# Patient Record
Sex: Male | Born: 1945 | Race: White | Hispanic: No | Marital: Married | Smoking: Former smoker
Health system: Southern US, Community
[De-identification: ages and names within clinical notes are randomized; demographics above are authoritative.]

## PROBLEM LIST (undated history)

## (undated) DIAGNOSIS — R7302 Impaired glucose tolerance (oral): Secondary | ICD-10-CM

## (undated) DIAGNOSIS — E785 Hyperlipidemia, unspecified: Secondary | ICD-10-CM

## (undated) DIAGNOSIS — K219 Gastro-esophageal reflux disease without esophagitis: Secondary | ICD-10-CM

## (undated) DIAGNOSIS — Z87442 Personal history of urinary calculi: Secondary | ICD-10-CM

## (undated) DIAGNOSIS — I209 Angina pectoris, unspecified: Secondary | ICD-10-CM

## (undated) DIAGNOSIS — M549 Dorsalgia, unspecified: Secondary | ICD-10-CM

## (undated) DIAGNOSIS — C61 Malignant neoplasm of prostate: Secondary | ICD-10-CM

## (undated) DIAGNOSIS — I219 Acute myocardial infarction, unspecified: Secondary | ICD-10-CM

## (undated) DIAGNOSIS — I8390 Asymptomatic varicose veins of unspecified lower extremity: Secondary | ICD-10-CM

## (undated) DIAGNOSIS — J449 Chronic obstructive pulmonary disease, unspecified: Secondary | ICD-10-CM

## (undated) DIAGNOSIS — R410 Disorientation, unspecified: Secondary | ICD-10-CM

## (undated) DIAGNOSIS — Z8719 Personal history of other diseases of the digestive system: Secondary | ICD-10-CM

## (undated) DIAGNOSIS — K429 Umbilical hernia without obstruction or gangrene: Secondary | ICD-10-CM

## (undated) DIAGNOSIS — R062 Wheezing: Secondary | ICD-10-CM

## (undated) DIAGNOSIS — I10 Essential (primary) hypertension: Secondary | ICD-10-CM

## (undated) DIAGNOSIS — G8929 Other chronic pain: Secondary | ICD-10-CM

## (undated) DIAGNOSIS — J189 Pneumonia, unspecified organism: Secondary | ICD-10-CM

## (undated) DIAGNOSIS — I2510 Atherosclerotic heart disease of native coronary artery without angina pectoris: Secondary | ICD-10-CM

## (undated) DIAGNOSIS — N390 Urinary tract infection, site not specified: Secondary | ICD-10-CM

## (undated) DIAGNOSIS — G629 Polyneuropathy, unspecified: Secondary | ICD-10-CM

## (undated) DIAGNOSIS — F419 Anxiety disorder, unspecified: Secondary | ICD-10-CM

## (undated) DIAGNOSIS — E7800 Pure hypercholesterolemia, unspecified: Secondary | ICD-10-CM

## (undated) DIAGNOSIS — N62 Hypertrophy of breast: Secondary | ICD-10-CM

## (undated) DIAGNOSIS — R011 Cardiac murmur, unspecified: Secondary | ICD-10-CM

## (undated) DIAGNOSIS — F32A Depression, unspecified: Secondary | ICD-10-CM

## (undated) DIAGNOSIS — G4730 Sleep apnea, unspecified: Secondary | ICD-10-CM

## (undated) DIAGNOSIS — G4700 Insomnia, unspecified: Secondary | ICD-10-CM

## (undated) DIAGNOSIS — R509 Fever, unspecified: Secondary | ICD-10-CM

## (undated) DIAGNOSIS — M5417 Radiculopathy, lumbosacral region: Secondary | ICD-10-CM

## (undated) DIAGNOSIS — F329 Major depressive disorder, single episode, unspecified: Secondary | ICD-10-CM

## (undated) DIAGNOSIS — I83893 Varicose veins of bilateral lower extremities with other complications: Secondary | ICD-10-CM

## (undated) DIAGNOSIS — IMO0001 Reserved for inherently not codable concepts without codable children: Secondary | ICD-10-CM

## (undated) DIAGNOSIS — R0602 Shortness of breath: Secondary | ICD-10-CM

## (undated) DIAGNOSIS — M1990 Unspecified osteoarthritis, unspecified site: Secondary | ICD-10-CM

## (undated) DIAGNOSIS — R739 Hyperglycemia, unspecified: Secondary | ICD-10-CM

## (undated) DIAGNOSIS — J42 Unspecified chronic bronchitis: Secondary | ICD-10-CM

## (undated) DIAGNOSIS — G4733 Obstructive sleep apnea (adult) (pediatric): Secondary | ICD-10-CM

## (undated) DIAGNOSIS — N400 Enlarged prostate without lower urinary tract symptoms: Secondary | ICD-10-CM

## (undated) DIAGNOSIS — J209 Acute bronchitis, unspecified: Secondary | ICD-10-CM

## (undated) DIAGNOSIS — M5440 Lumbago with sciatica, unspecified side: Secondary | ICD-10-CM

## (undated) DIAGNOSIS — Z0001 Encounter for general adult medical examination with abnormal findings: Secondary | ICD-10-CM

## (undated) DIAGNOSIS — J069 Acute upper respiratory infection, unspecified: Secondary | ICD-10-CM

## (undated) HISTORY — DX: Major depressive disorder, single episode, unspecified: F32.9

## (undated) HISTORY — DX: Reserved for inherently not codable concepts without codable children: IMO0001

## (undated) HISTORY — DX: Impaired glucose tolerance (oral): R73.02

## (undated) HISTORY — DX: Dorsalgia, unspecified: M54.9

## (undated) HISTORY — DX: Fever, unspecified: R50.9

## (undated) HISTORY — DX: Acute upper respiratory infection, unspecified: J06.9

## (undated) HISTORY — PX: CARDIAC CATHETERIZATION: SHX172

## (undated) HISTORY — DX: Gastro-esophageal reflux disease without esophagitis: K21.9

## (undated) HISTORY — DX: Other chronic pain: G89.29

## (undated) HISTORY — DX: Pneumonia, unspecified organism: J18.9

## (undated) HISTORY — DX: Urinary tract infection, site not specified: N39.0

## (undated) HISTORY — DX: Hypertrophy of breast: N62

## (undated) HISTORY — PX: ANGIOPLASTY: SHX39

## (undated) HISTORY — DX: Atherosclerotic heart disease of native coronary artery without angina pectoris: I25.10

## (undated) HISTORY — PX: WRIST SURGERY: SHX841

## (undated) HISTORY — DX: Hyperglycemia, unspecified: R73.9

## (undated) HISTORY — DX: Obstructive sleep apnea (adult) (pediatric): G47.33

## (undated) HISTORY — PX: BACK SURGERY: SHX140

## (undated) HISTORY — DX: Disorientation, unspecified: R41.0

## (undated) HISTORY — DX: Umbilical hernia without obstruction or gangrene: K42.9

## (undated) HISTORY — DX: Wheezing: R06.2

## (undated) HISTORY — DX: Encounter for general adult medical examination with abnormal findings: Z00.01

## (undated) HISTORY — DX: Depression, unspecified: F32.A

## (undated) HISTORY — DX: Polyneuropathy, unspecified: G62.9

## (undated) HISTORY — DX: Pure hypercholesterolemia, unspecified: E78.00

## (undated) HISTORY — DX: Lumbago with sciatica, unspecified side: M54.40

## (undated) HISTORY — PX: HERNIA REPAIR: SHX51

## (undated) HISTORY — DX: Acute bronchitis, unspecified: J20.9

## (undated) HISTORY — DX: Shortness of breath: R06.02

## (undated) HISTORY — DX: Acute myocardial infarction, unspecified: I21.9

## (undated) HISTORY — DX: Benign prostatic hyperplasia without lower urinary tract symptoms: N40.0

## (undated) HISTORY — DX: Insomnia, unspecified: G47.00

## (undated) HISTORY — DX: Anxiety disorder, unspecified: F41.9

## (undated) HISTORY — DX: Varicose veins of bilateral lower extremities with other complications: I83.893

## (undated) HISTORY — DX: Radiculopathy, lumbosacral region: M54.17

## (undated) HISTORY — DX: Hyperlipidemia, unspecified: E78.5

## (undated) HISTORY — DX: Unspecified chronic bronchitis: J42

## (undated) HISTORY — PX: APPENDECTOMY: SHX54

---

## 1995-03-10 ENCOUNTER — Encounter: Payer: Self-pay | Admitting: Gastroenterology

## 1995-03-14 ENCOUNTER — Encounter (INDEPENDENT_AMBULATORY_CARE_PROVIDER_SITE_OTHER): Payer: Self-pay | Admitting: *Deleted

## 1995-03-15 ENCOUNTER — Encounter: Payer: Self-pay | Admitting: Gastroenterology

## 1995-04-21 ENCOUNTER — Encounter: Payer: Self-pay | Admitting: Gastroenterology

## 2000-10-02 ENCOUNTER — Ambulatory Visit (HOSPITAL_COMMUNITY): Admission: RE | Admit: 2000-10-02 | Discharge: 2000-10-02 | Payer: Self-pay | Admitting: Internal Medicine

## 2000-10-02 ENCOUNTER — Encounter: Payer: Self-pay | Admitting: Gastroenterology

## 2000-10-06 ENCOUNTER — Encounter: Payer: Self-pay | Admitting: Internal Medicine

## 2000-10-06 ENCOUNTER — Ambulatory Visit (HOSPITAL_COMMUNITY): Admission: RE | Admit: 2000-10-06 | Discharge: 2000-10-06 | Payer: Self-pay | Admitting: Internal Medicine

## 2001-01-02 ENCOUNTER — Encounter: Payer: Self-pay | Admitting: Cardiology

## 2001-01-02 ENCOUNTER — Ambulatory Visit (HOSPITAL_COMMUNITY): Admission: RE | Admit: 2001-01-02 | Discharge: 2001-01-02 | Payer: Self-pay | Admitting: *Deleted

## 2004-11-03 ENCOUNTER — Ambulatory Visit: Payer: Self-pay | Admitting: *Deleted

## 2004-11-23 ENCOUNTER — Ambulatory Visit: Payer: Self-pay

## 2005-09-21 ENCOUNTER — Ambulatory Visit: Payer: Self-pay | Admitting: *Deleted

## 2006-02-07 ENCOUNTER — Ambulatory Visit: Payer: Self-pay | Admitting: *Deleted

## 2006-02-15 ENCOUNTER — Ambulatory Visit: Payer: Self-pay

## 2006-08-10 ENCOUNTER — Ambulatory Visit: Payer: Self-pay | Admitting: *Deleted

## 2007-02-21 ENCOUNTER — Ambulatory Visit: Payer: Self-pay | Admitting: *Deleted

## 2007-03-13 ENCOUNTER — Ambulatory Visit: Payer: Self-pay | Admitting: Internal Medicine

## 2007-03-13 LAB — CONVERTED CEMR LAB
ALT: 35 U/L
AST: 27 U/L
Albumin: 3.4 g/dL — ABNORMAL LOW
Alkaline Phosphatase: 91 U/L
BUN: 12 mg/dL
Basophils Absolute: 0 10*3/uL
Basophils Relative: 0 %
CO2: 32 meq/L
Calcium: 8.8 mg/dL
Chloride: 99 meq/L
Creatinine, Ser: 1.1 mg/dL
Eosinophils Absolute: 0.2 10*3/uL
Eosinophils Relative: 1.6 %
GFR calc Af Amer: 88 mL/min
GFR calc non Af Amer: 72 mL/min
Glucose, Bld: 139 mg/dL — ABNORMAL HIGH
HCT: 42.6 %
Hemoglobin: 14.5 g/dL
Lymphocytes Relative: 23.1 %
MCHC: 34.2 g/dL
MCV: 88.1 fL
Monocytes Absolute: 1.4 10*3/uL — ABNORMAL HIGH
Monocytes Relative: 9.8 %
Neutro Abs: 9.7 10*3/uL — ABNORMAL HIGH
Neutrophils Relative %: 65.5 %
Platelets: 302 10*3/uL
Potassium: 4.7 meq/L
RBC: 4.83 M/uL
RDW: 12.3 %
Sodium: 136 meq/L
TSH: 3.57 u[IU]/mL
Total Bilirubin: 0.5 mg/dL
Total Protein: 6.8 g/dL
WBC: 14.7 10*3/uL — ABNORMAL HIGH

## 2007-03-21 ENCOUNTER — Ambulatory Visit: Payer: Self-pay | Admitting: *Deleted

## 2007-03-21 ENCOUNTER — Ambulatory Visit: Payer: Self-pay

## 2007-03-21 LAB — CONVERTED CEMR LAB
ALT: 31 U/L
AST: 28 U/L
Albumin: 3.3 g/dL — ABNORMAL LOW
Alkaline Phosphatase: 94 U/L
BUN: 10 mg/dL
Bilirubin, Direct: 0.1 mg/dL
CO2: 34 meq/L — ABNORMAL HIGH
Calcium: 8.8 mg/dL
Chloride: 102 meq/L
Cholesterol: 138 mg/dL
Creatinine, Ser: 1 mg/dL
Direct LDL: 76.6 mg/dL
GFR calc Af Amer: 98 mL/min
GFR calc non Af Amer: 81 mL/min
Glucose, Bld: 116 mg/dL — ABNORMAL HIGH
HDL: 32.4 mg/dL — ABNORMAL LOW
Potassium: 4.2 meq/L
Sodium: 142 meq/L
Total Bilirubin: 0.7 mg/dL
Total CHOL/HDL Ratio: 4.3
Total Protein: 6.5 g/dL
Triglycerides: 238 mg/dL
VLDL: 48 mg/dL — ABNORMAL HIGH

## 2007-04-04 ENCOUNTER — Ambulatory Visit: Payer: Self-pay | Admitting: Internal Medicine

## 2007-10-11 ENCOUNTER — Ambulatory Visit: Payer: Self-pay | Admitting: Internal Medicine

## 2008-04-25 ENCOUNTER — Ambulatory Visit: Payer: Self-pay | Admitting: Internal Medicine

## 2008-04-29 ENCOUNTER — Ambulatory Visit: Payer: Self-pay | Admitting: Pulmonary Disease

## 2008-04-29 DIAGNOSIS — E785 Hyperlipidemia, unspecified: Secondary | ICD-10-CM | POA: Insufficient documentation

## 2008-04-29 DIAGNOSIS — I219 Acute myocardial infarction, unspecified: Secondary | ICD-10-CM | POA: Insufficient documentation

## 2008-04-29 DIAGNOSIS — J42 Unspecified chronic bronchitis: Secondary | ICD-10-CM | POA: Insufficient documentation

## 2008-04-29 HISTORY — DX: Acute myocardial infarction, unspecified: I21.9

## 2008-04-29 HISTORY — DX: Hyperlipidemia, unspecified: E78.5

## 2008-04-29 HISTORY — DX: Unspecified chronic bronchitis: J42

## 2008-06-12 ENCOUNTER — Ambulatory Visit: Payer: Self-pay | Admitting: Pulmonary Disease

## 2008-07-30 ENCOUNTER — Ambulatory Visit: Payer: Self-pay | Admitting: Pulmonary Disease

## 2008-07-30 ENCOUNTER — Ambulatory Visit: Payer: Self-pay | Admitting: Internal Medicine

## 2008-07-30 LAB — CONVERTED CEMR LAB
ALT: 17 U/L
AST: 22 U/L

## 2008-08-08 ENCOUNTER — Telehealth (INDEPENDENT_AMBULATORY_CARE_PROVIDER_SITE_OTHER): Payer: Self-pay | Admitting: *Deleted

## 2008-08-13 ENCOUNTER — Telehealth (INDEPENDENT_AMBULATORY_CARE_PROVIDER_SITE_OTHER): Payer: Self-pay | Admitting: *Deleted

## 2008-08-20 ENCOUNTER — Encounter: Payer: Self-pay | Admitting: Pulmonary Disease

## 2008-09-05 ENCOUNTER — Ambulatory Visit: Payer: Self-pay | Admitting: Pulmonary Disease

## 2008-09-12 ENCOUNTER — Telehealth (INDEPENDENT_AMBULATORY_CARE_PROVIDER_SITE_OTHER): Payer: Self-pay | Admitting: *Deleted

## 2008-10-20 ENCOUNTER — Ambulatory Visit: Payer: Self-pay | Admitting: Internal Medicine

## 2008-10-23 ENCOUNTER — Telehealth (INDEPENDENT_AMBULATORY_CARE_PROVIDER_SITE_OTHER): Payer: Self-pay | Admitting: *Deleted

## 2008-11-25 ENCOUNTER — Telehealth (INDEPENDENT_AMBULATORY_CARE_PROVIDER_SITE_OTHER): Payer: Self-pay | Admitting: *Deleted

## 2008-12-09 ENCOUNTER — Ambulatory Visit: Payer: Self-pay | Admitting: Cardiology

## 2008-12-09 LAB — CONVERTED CEMR LAB
BUN: 14 mg/dL
CO2: 25 meq/L
Calcium: 9.1 mg/dL
Chloride: 101 meq/L
Creatinine, Ser: 1.03 mg/dL
Glucose, Bld: 83 mg/dL
Potassium: 4.6 meq/L
Pro B Natriuretic peptide (BNP): 55.4 pg/mL
Sodium: 142 meq/L

## 2009-01-12 ENCOUNTER — Ambulatory Visit: Payer: Self-pay | Admitting: Internal Medicine

## 2009-04-21 ENCOUNTER — Telehealth: Payer: Self-pay | Admitting: Internal Medicine

## 2009-04-22 ENCOUNTER — Telehealth: Payer: Self-pay | Admitting: Internal Medicine

## 2009-06-18 ENCOUNTER — Telehealth: Payer: Self-pay | Admitting: Internal Medicine

## 2009-08-06 ENCOUNTER — Encounter (INDEPENDENT_AMBULATORY_CARE_PROVIDER_SITE_OTHER): Payer: Self-pay | Admitting: *Deleted

## 2009-10-14 ENCOUNTER — Ambulatory Visit: Payer: Self-pay | Admitting: Internal Medicine

## 2009-10-16 DIAGNOSIS — I2510 Atherosclerotic heart disease of native coronary artery without angina pectoris: Secondary | ICD-10-CM | POA: Insufficient documentation

## 2009-10-16 HISTORY — DX: Atherosclerotic heart disease of native coronary artery without angina pectoris: I25.10

## 2009-10-19 ENCOUNTER — Ambulatory Visit: Payer: Self-pay | Admitting: Internal Medicine

## 2010-04-12 ENCOUNTER — Ambulatory Visit: Payer: Self-pay | Admitting: Family Medicine

## 2010-06-11 ENCOUNTER — Encounter (INDEPENDENT_AMBULATORY_CARE_PROVIDER_SITE_OTHER): Payer: Self-pay | Admitting: *Deleted

## 2010-10-08 ENCOUNTER — Ambulatory Visit: Payer: Self-pay | Admitting: Internal Medicine

## 2010-10-08 ENCOUNTER — Encounter: Payer: Self-pay | Admitting: Internal Medicine

## 2010-10-08 ENCOUNTER — Telehealth: Payer: Self-pay | Admitting: Internal Medicine

## 2010-10-08 LAB — CONVERTED CEMR LAB
BUN: 9 mg/dL
Basophils Absolute: 0 10*3/uL
Basophils Relative: 0.5 %
CO2: 32 meq/L
Calcium: 8.7 mg/dL
Chloride: 101 meq/L
Creatinine, Ser: 0.9 mg/dL
Eosinophils Absolute: 0.4 10*3/uL
Eosinophils Relative: 4.9 %
GFR calc non Af Amer: 85.71 mL/min
Glucose, Bld: 88 mg/dL
HCT: 37.2 % — ABNORMAL LOW
Hemoglobin: 12.6 g/dL — ABNORMAL LOW
Lymphocytes Relative: 27.5 %
Lymphs Abs: 2.3 10*3/uL
MCHC: 34 g/dL
MCV: 91.9 fL
Monocytes Absolute: 0.6 10*3/uL
Monocytes Relative: 7.5 %
Neutro Abs: 5 10*3/uL
Neutrophils Relative %: 59.6 %
Platelets: 234 10*3/uL
Potassium: 3.6 meq/L
Pro B Natriuretic peptide (BNP): 53.3 pg/mL
RBC: 4.05 M/uL — ABNORMAL LOW
RDW: 13.8 %
Sodium: 140 meq/L
TSH: 4.97 u[IU]/mL
WBC: 8.5 10*3/uL

## 2010-10-12 ENCOUNTER — Telehealth: Payer: Self-pay | Admitting: Internal Medicine

## 2010-10-14 ENCOUNTER — Ambulatory Visit: Payer: Self-pay | Admitting: Internal Medicine

## 2010-10-14 ENCOUNTER — Ambulatory Visit: Payer: Self-pay

## 2010-10-14 ENCOUNTER — Encounter: Payer: Self-pay | Admitting: Internal Medicine

## 2010-10-14 ENCOUNTER — Ambulatory Visit (HOSPITAL_COMMUNITY): Admission: RE | Admit: 2010-10-14 | Discharge: 2010-10-14 | Payer: Self-pay | Admitting: Internal Medicine

## 2010-10-15 ENCOUNTER — Encounter: Payer: Self-pay | Admitting: Internal Medicine

## 2010-10-26 ENCOUNTER — Telehealth (INDEPENDENT_AMBULATORY_CARE_PROVIDER_SITE_OTHER): Payer: Self-pay | Admitting: Radiology

## 2010-10-27 ENCOUNTER — Encounter: Payer: Self-pay | Admitting: Cardiology

## 2010-10-27 ENCOUNTER — Ambulatory Visit: Payer: Self-pay

## 2010-10-27 ENCOUNTER — Encounter (HOSPITAL_COMMUNITY)
Admission: RE | Admit: 2010-10-27 | Discharge: 2010-12-25 | Payer: Self-pay | Source: Home / Self Care | Attending: Internal Medicine | Admitting: Internal Medicine

## 2010-10-27 ENCOUNTER — Ambulatory Visit: Payer: Self-pay | Admitting: Internal Medicine

## 2010-10-27 ENCOUNTER — Telehealth (INDEPENDENT_AMBULATORY_CARE_PROVIDER_SITE_OTHER): Payer: Self-pay | Admitting: *Deleted

## 2010-10-27 ENCOUNTER — Ambulatory Visit: Payer: Self-pay | Admitting: Cardiology

## 2010-10-27 LAB — CONVERTED CEMR LAB
BUN: 10 mg/dL
CO2: 35 meq/L — ABNORMAL HIGH
Calcium: 8.8 mg/dL
Chloride: 97 meq/L
Creatinine, Ser: 0.9 mg/dL
GFR calc non Af Amer: 94.96 mL/min
Glucose, Bld: 108 mg/dL — ABNORMAL HIGH
Potassium: 3.1 meq/L — ABNORMAL LOW
Sodium: 141 meq/L

## 2010-10-28 ENCOUNTER — Encounter: Payer: Self-pay | Admitting: Internal Medicine

## 2010-10-28 ENCOUNTER — Telehealth: Payer: Self-pay | Admitting: Internal Medicine

## 2010-11-01 ENCOUNTER — Telehealth: Payer: Self-pay | Admitting: Internal Medicine

## 2010-11-04 ENCOUNTER — Ambulatory Visit: Payer: Self-pay | Admitting: Internal Medicine

## 2010-11-04 ENCOUNTER — Encounter: Payer: Self-pay | Admitting: Internal Medicine

## 2010-11-05 ENCOUNTER — Ambulatory Visit: Payer: Self-pay | Admitting: Internal Medicine

## 2010-11-05 DIAGNOSIS — R0602 Shortness of breath: Secondary | ICD-10-CM

## 2010-11-05 HISTORY — DX: Shortness of breath: R06.02

## 2010-11-05 LAB — CONVERTED CEMR LAB
BUN: 14 mg/dL
CO2: 30 meq/L
Calcium: 9 mg/dL
Chloride: 98 meq/L
Creatinine, Ser: 1.02 mg/dL
Glucose, Bld: 104 mg/dL — ABNORMAL HIGH
Potassium: 3.5 meq/L
Sodium: 141 meq/L

## 2010-11-08 ENCOUNTER — Ambulatory Visit: Payer: Self-pay | Admitting: Internal Medicine

## 2010-11-08 ENCOUNTER — Inpatient Hospital Stay (HOSPITAL_BASED_OUTPATIENT_CLINIC_OR_DEPARTMENT_OTHER): Admission: RE | Admit: 2010-11-08 | Discharge: 2010-11-08 | Payer: Self-pay | Admitting: Internal Medicine

## 2010-11-11 ENCOUNTER — Telehealth: Payer: Self-pay | Admitting: Internal Medicine

## 2010-12-01 ENCOUNTER — Encounter: Payer: Self-pay | Admitting: Pulmonary Disease

## 2010-12-01 ENCOUNTER — Ambulatory Visit (HOSPITAL_BASED_OUTPATIENT_CLINIC_OR_DEPARTMENT_OTHER)
Admission: RE | Admit: 2010-12-01 | Discharge: 2010-12-01 | Payer: Self-pay | Source: Home / Self Care | Admitting: Internal Medicine

## 2010-12-06 ENCOUNTER — Telehealth: Payer: Self-pay | Admitting: Internal Medicine

## 2010-12-16 ENCOUNTER — Telehealth: Payer: Self-pay | Admitting: Internal Medicine

## 2010-12-17 ENCOUNTER — Encounter: Payer: Self-pay | Admitting: Internal Medicine

## 2010-12-17 DIAGNOSIS — G4730 Sleep apnea, unspecified: Secondary | ICD-10-CM | POA: Insufficient documentation

## 2011-01-06 ENCOUNTER — Ambulatory Visit: Admit: 2011-01-06 | Payer: Self-pay | Admitting: Pulmonary Disease

## 2011-01-19 ENCOUNTER — Telehealth: Payer: Self-pay | Admitting: Internal Medicine

## 2011-01-21 ENCOUNTER — Ambulatory Visit: Admit: 2011-01-21 | Payer: Self-pay | Admitting: Internal Medicine

## 2011-01-23 LAB — CONVERTED CEMR LAB: AST: 24 U/L

## 2011-01-25 ENCOUNTER — Ambulatory Visit: Admit: 2011-01-25 | Payer: Self-pay | Admitting: Pulmonary Disease

## 2011-01-27 NOTE — Assessment & Plan Note (Signed)
 Summary: f86m/jss   Visit Type:  Follow-up Referring Provider:  self-referral Primary Provider:  [facility]  CC:  pt dooing ok .  History of Present Illness: the patient is a 65 year old gentleman with CAD, COPD, dyslipidemia, hypertension. I last saw him in February of this year.  In the interval. He denies chest pain. His breathing is short some days. He is doing quite a bit of fishing. His weight has gone up. He has a hard time with sweets.  being evaluated for abdominal hernia  Current Medications (verified): 1)  Proair  Hfa 108 (90 Base) Mcg/act  Aers (Albuterol  Sulfate) .... 2 Puffs Qid Prn 2)  Symbicort 160-4.5 Mcg/act  Aero (Budesonide-Formoterol  Fumarate) .SABRA.. 1 Puffs Two Times A Day 3)  Simvastatin  80 Mg  Tabs (Simvastatin ) .... One Tablet Daily. 4)  Toprol  Xl 25 Mg  Tb24 (Metoprolol  Succinate) .... One Tablet Daily. 5)  Lasix  20 Mg  Tabs (Furosemide ) .... One Tablet Two Times A Day 6)  Adult Aspirin  Ec Low Strength 81 Mg  Tbec (Aspirin ) .... One Tablet Daily. 7)  Methadone  Hcl 10 Mg  Tabs (Methadone  Hcl) .... Two  Tablets Four Times Daily 8)  Percocet 10-325 Mg  Tabs (Oxycodone -Acetaminophen ) .... Use Four Tablets Daily As Needed 9)  Flexeril 5 Mg  Tabs (Cyclobenzaprine Hcl) .... Take Two Tabs At Bedtime 10)  Klonopin 0.5 Mg  Tabs (Clonazepam) .... One Tablet At Bedtime As Needed 11)  Sertraline Hcl 100 Mg  Tabs (Sertraline Hcl) .... 200 Mg Daily 12)  Prilosec 20 Mg  Cpdr (Omeprazole ) .... One Tablet Daily. 13)  Isosorbide Mononitrate Cr 30 Mg Xr24h-Tab (Isosorbide Mononitrate) .... Take 1 Tablet By Mouth Once A Day 14)  Lisinopril 20 Mg Tabs (Lisinopril) .... Take 1 Tablet By Mouth Once A Day 15)  Atacand 8 Mg Tabs (Candesartan Cilexetil) .... 1/2 Tab Once Daily  Allergies (verified): No Known Drug Allergies  Past History:  Past Surgical History: Last updated: 04/29/2008 Back surgery x 3 Appendectomy  Family History: Last updated: 04/29/2008 Father -  emphysema, prostate cancer  Social History: Last updated: 04/29/2008 Married.  Retired naval architect.  Disabled due to back problems.  Quit smoking in 1997.  Smoke 1.5 packs per day since age 21.  Past Medical History: CAD Hypercholesterolemia GERD Pneumonia Anxiety Chronic back pain ACE induced cough Chronic bronchitis  Vital Signs:  Patient profile:   65 year old male Height:      70 inches Weight:      229 pounds BMI:     32.98 Pulse rate:   70 / minute BP sitting:   130 / 72  (left arm) Cuff size:   regular  Vitals Entered By: Barnie Molt, CNA (October 19, 2009 3:43 PM)  Physical Exam  General:  Well developed, well nourished, in no acute distress. Head:  normocephalic and atraumatic Neck:  JVP normal. No definite bruits Lungs:  moving air. No Rales, no wheezes Heart:  regular rate rhythm. S1, S2. No S3. No significant murmur Abdomen:  obese. Tense. Nontender ventral hernia noted, reducible. Extremities:  no edema   EKG  Procedure date:  10/19/2009  Findings:      normal sinus rhythm. 74 beats per minute. Inferior wall MI  Impression & Recommendations:  Problem # 1:  CAD (ICD-414.00) no signs of active ischemia, I would keep him on the same regimen. Encouraged him to stay activem try to bring his weight down. Her current suite  I have substituted Cozaar  for his Atacand.  I don't think he is on lisinopril. There was some confusion with his medicines today. The following medications were removed from the medication list:    Lisinopril 20 Mg Tabs (Lisinopril) .SABRA... Take 1 tablet by mouth once a day    Isosorbide Mononitrate Cr 30 Mg Xr24h-tab (Isosorbide mononitrate) .SABRA... Take 1 tablet by mouth once a  day His updated medication list for this problem includes:    Toprol  Xl 25 Mg Tb24 (Metoprolol  succinate) ..... One tablet daily.    Adult Aspirin  Ec Low Strength 81 Mg Tbec (Aspirin ) ..... One tablet daily.    Isosorbide Mononitrate Cr 30 Mg Xr24h-tab  (Isosorbide mononitrate) .SABRA... Take 1 tablet by mouth once a day  Orders: EKG w/ Interpretation (93000)  Problem # 2:  HYPERLIPIDEMIA (ICD-272.4) lipids are pretty good and lipoma panel shows an LDL of 75 with 1157 particles, HDL 41, triglycerides of 138, and would keep him on the same regimen. I still recommended that he lose weight. His updated medication list for this problem includes:    Simvastatin  80 Mg Tabs (Simvastatin ) ..... One tablet daily.  Patient Instructions: 1)  continue medicines switched Cozaar  for Atacand. Watch weight try to cut back on sweets. Followup 9-10 months. Prescriptions: COZAAR  50 MG TABS (LOSARTAN  POTASSIUM) 1 tablet every day  #30 x 11   Entered by:   Jacquelyn Reiss, RN, BSN   Authorized by:   Vina Emmalene Gull, MD, [facility]   Signed by:   Ritta Aguas, RN, BSN on 10/19/2009   Method used:   Electronically to        [facility] [place] (retail)       [address]       [place], [place]  [address]       Ph: [phone]       Fax: [phone]   RxID:   [id number]

## 2011-01-27 NOTE — Progress Notes (Signed)
Summary: [facility]  [facility]   Imported By: Lester Langdon 03/02/2010 09:33:47  _____________________________________________________________________  External Attachment:    Type:   Image     Comment:   External Document

## 2011-01-27 NOTE — Letter (Signed)
Summary: Cardiac Catheterization Instructions- [facility]  [facility], Main Office  [address]   [place], [place] [address]   Phone: [phone]  Fax: [phone]     11/05/2010 [name]: [id number]  Albert Wade [address] [place], [place]  [address]  Dear Mr. STOLP,   You are scheduled for a Cardiac Catheterization on _11/14/2011________ with Dr.__Bensimhom____________  Please arrive to the 1st floor of the Heart and Vascular Center at [facility] at _630____ am  on the day of your procedure. Please do not arrive before 6:30 a.m. Call the Heart and Vascular Center at [phone] if you are unable to make your appointmnet. The Code to get into the parking garage under the building is_2000_______. Take the elevators to the 1st floor. You must have someone to drive you home. Someone must be with you for the first 24 hours after you arrive home. Please wear clothes that are easy to get on and off and wear slip-on shoes. Do not eat or drink after midnight except water with your medications that morning. Bring all your medications and current insurance cards with you.  ___ DO NOT take these medications before your procedure: ________________________________________________________________  ___ Make sure you take your aspirin.  __x_ You may take ALL of your medications with water that morning. ________Can hold LASIX ________________________________________________________________________________________________________________________  ___ DO NOT take ANY medications before your procedure.  ___ Pre-med instructions:  ________________________________________________________________________________________________________________________________  The usual length of stay after your procedure is 2 to 3 hours. This can vary.  If you have any questions, please call the office at the number listed above.   Layne Benton, RN, BSN

## 2011-01-27 NOTE — Consult Note (Signed)
Summary: [facility]  [facility]   Imported By: Lester [place] 03/02/2010 09:32:29  _____________________________________________________________________  External Attachment:    Type:   Image     Comment:   External Document

## 2011-01-27 NOTE — Miscellaneous (Signed)
  Clinical Lists Changes  Problems: Added new problem of SLEEP APNEA (ICD-780.57) Orders: Added new Referral order of Pulmonary Referral (Pulmonary) - Signed 

## 2011-01-27 NOTE — Op Note (Signed)
Summary: EGD                         [facility]  Patient:    Albert Wade, Albert Wade                     [name]: [id number] Proc. Date: 10/02/00 Adm. Date:  [id number] Attending:  Estella Husk CC:         Dr. Devin Going T. Pleas Koch., M.D. [facility]   Procedure Report  PROCEDURE:  Esophagogastroduodenoscopy with endoscopic removal of food impaction.  SURGEON:  Wilhemina Bonito. Eda Keys., M.D.  INDICATIONS:  Acute food impaction.  HISTORY:  This is a 65 year old gentleman with a history of coronary artery disease, prior myocardial infarction, and gastroesophageal reflux disease who underwent upper endoscopy with Dr. Russella Dar in 1996, for evaluation of chest pain.  He was found to have a hiatal hernia.  He has not been seen since.  He reports intermittent solid food dysphagia for which he has not brought to medical attention.  Last evening, he was eating a [place] York strip steak and developed acute dysphagia.  He has since been ________.  He is now for endoscopy with endoscopic removal of meat impaction.  The nature of the procedure as well as the risks, benefits, and alternatives were reviewed.  He understood and agreed to proceed.  PHYSICAL EXAMINATION:  A slightly uncomfortable appearing male in no acute distress.  He is alert and oriented.  Vital signs are stable.  Lungs are clear.  The heart is regular.  Abdomen is soft and nontender.  DESCRIPTION OF PROCEDURE:  After informed consent was obtained, the patient was sedated with 100 mcg of fentanyl and 10 mg of Versed IV.  The Olympus endoscope was passed orally under direct vision into the esophagus.  The proximal esophagus was normal.  The mid esophagus revealed pooled secretions. These were suctioned.  The distal esophagus revealed sausage shaped food impaction.  This was easily advanced into the stomach with gentle pressure. The distal esophagus revealed macerated mucosa with benign stricture at 38 cm from the  incisors.  The stricture was fibrous and concentric.  The stomach revealed a small sliding hiatal hernia, but was otherwise normal.  The duodenal bulb and postbulbar duodenum were normal.  IMPRESSION:  Gastroesophageal reflux disease complicated by peptic stricture with acute food impaction, status post endoscopic removal.  RECOMMENDATIONS: 1. NPO for two hours and then clear liquids for 24 hours and then soft diet    until follow up dilation. 2. Prevacid 30 mg p.o. q.d. 3. Upper endoscopy with Savary dilation of the esophagus, October 06, 2000, at    9 a.m. with myself per patient request. DD:  10/02/00 TD:  10/03/00 Job: [id number]

## 2011-01-27 NOTE — Miscellaneous (Signed)
  Clinical Lists Changes  Problems: Added new problem of SHORTNESS OF BREATH (ICD-786.05) Orders: Added new Referral order of Cardiac Catheterization (Cardiac Cath) - Signed

## 2011-01-27 NOTE — Progress Notes (Signed)
Summary: re sleep study [facility])  Phone Note Call from Patient   Caller: wife Corrie Dandy [phone] or [phone] Reason for Call: Talk to Nurse Summary of Call: pt waiting on sleep study to be set up since monday-hasn't heard anything and calling to see if can be set up in [place] where they live, says they have a sleep clinic there Initial call taken by: Glynda Jaeger,  November 11, 2010 11:19 AM  Follow-up for Phone Call        Pt had cardiac cath on 11/08/10 by Dr Gala Romney.  Dr Gala Romney recommended that the pt have a sleep study. Order placed for sleep study, the pt would like to have this in [place] if possible.   I called and spoke with Cicero Duck in the [place] office. She will fax an order for [facility] the sleep center in [place]. We will contact the pt after we receive this form. Sherri Rad, RN, BSN  November 11, 2010 11:33 AM   Referral form received from [place] office. I have left a message for the pt's wife to call. Sherri Rad, RN, BSN  November 11, 2010 11:56 AM

## 2011-01-27 NOTE — Op Note (Signed)
Summary: EGD                         [facility]  Patient:    Albert Wade, Albert Wade                     [name]: [id number] Proc. Date: 10/06/00 Adm. Date:  [id number] Attending:  Estella Husk CC:         Vonita Moss, M.D.   Operative Report  PROCEDURE:  Esophagogastroduodenoscopy with Savary (guide wire), dilation of the esophagus with fluoroscopic assistance.  INDICATIONS:  Symptomatic peptic stricture.  HISTORY OF PRESENT ILLNESS:  This is a 65 year old gentleman who was evaluated four days ago with an acute food impaction.  This was relieved endoscopically. He was found to have macerated distal esophagus with a benign stricture at 38 cm from the incisors.  He was since placed on Prevacid 30 mg daily and a modified diet.  He is now for upper endoscopy with esophageal dilation.  The nature of the procedure as well as the risks, benefits, and alternatives have been reviewed.  He understood and wishes to proceed.  PHYSICAL EXAMINATION:  Well-appearing male in no acute distress.  He is alert and oriented.  Vital signs are stable.  Lungs are clear.  Heart is regular. Abdomen soft.  DESCRIPTION OF PROCEDURE:  After informed consent was obtained, the patient was sedated with 100 mg of Demerol and 12 mg of Versed IV.  The Olympus endoscope was passed orally under direct vision into the esophagus.  The esophagus revealed a healing esophagitis at the gastroesophageal junction.  No Barretts esophagus.  Underlying stricture was apparent.  The endoscope passed beyond this region without resistance.  A small sliding hiatal hernia was present.  The stomach, duodenal bulb, and postbulbar duodenum were normal.  THERAPY:  After completing the endoscopic survey, a Savary guide wire was placed into the gastric antrum under fluoroscopic control.  The endoscope was reviewed with the guide wire maintained in position.  Subsequently, 17- and 18-mm dilators were passed sequentially over  the guide wire.  Fluoroscopy was utilized at each portion of each dilation.  No resistance was encountered with the passage of either dilator.  No heme present upon removal of either dilator.  The patient tolerated the procedure well.  IMPRESSION:  Gastroesophageal reflux disease complicated by peptic stricture status post Savary dilation to a maximum diameter of 18 mm.  RECOMMENDATIONS: 1. N.p.o. for two hours, then clear liquids for two hours, then soft diet    until a.m. 2. Continue Prevacid 30 mg p.o. q day. 3. General medical followup with Dr. Dossie Arbour. 4. GI followup as needed for recurrent symptoms. DD:  10/06/00 TD:  10/08/00 Job: [id number]

## 2011-01-27 NOTE — Progress Notes (Signed)
Summary: re labwork  Phone Note Call from Patient   Caller: Patient [phone] Reason for Call: Talk to Nurse Summary of Call: ok to leave message if na, talked to whitney yesterday re lab work, wants to know if can do that in [place]? Initial call taken by: Glynda Jaeger,  October 28, 2010 1:04 PM  Follow-up for Phone Call        pt aware. Whitney Maeola Sarah RN  October 28, 2010 2:20 PM  Follow-up by: Whitney Maeola Sarah RN,  October 28, 2010 2:19 PM

## 2011-01-27 NOTE — Assessment & Plan Note (Signed)
 Summary: FOLLOW UP/ MBW   Visit Type:  Follow-up Referred by:  self-referral PCP:  [facility]  Chief Complaint:  fever x 2 days before; fell 8 days ago (has seen back doctor).  History of Present Illness: I saw Mr. Clugston in follow up for his chronic cough.  He has been doing better since his last visit.  His cough has improved, and he is no longer getting a gagging sensation in his throat.  He has also lost about 6 lbs since his last visit.  He had trouble with feeling hungry while using symbicort on a regular basis.  He is only using this 2 or 3 times per week because of this.  This does help.  He is using his proair  2 or 3 times per day, and this does help.  His breathing has gotten better at night.   He had a fever of 102F two days ago.  He has since recovered from this, and does not have any specific complaints.  He had PFT's on 07/30/08 which showed normal spirometry, no bronchodilator response, normal lung volumes, and normal diffusing capacity.     Current Allergies: No known allergies   Past Medical History:    CAD    MI    Hypercholesterolemia    GERD    Pneumonia    Anxiety    Chronic back pain    ACE induced cough    Chronic bronchitis      Vital Signs:  Patient Profile:   65 Years Old Male Height:     70 inches Weight:      214.50 pounds O2 Sat:      95 % O2 treatment:    Room Air Temp:     97.6 degrees F oral Pulse rate:   77 / minute BP sitting:   104 / 64  (left arm) Cuff size:   regular  Vitals Entered ByBETHA Almarie Erb (September 05, 2008 2:37 PM)             Comments Medications reviewed with patient Almarie Erb  September 05, 2008 2:38 PM      Physical Exam  General:     normal appearance and obese.   Nose:     no sinus tenderness, deviated nasal septum Mouth:     wears dentures. Neck:     no LAN Lungs:     no wheezing or rales Heart:       regular rhythm, normal rate, and no murmurs.     Abdomen:     obese, soft, normal bowel sounds Extremities:     no edema, cyanosis, or clubbing      Impression & Recommendations:  Problem # 1:  BRONCHITIS, CHRONIC (ICD-491.9) He has improved quite a bit since he stopped his ACE inhibitor.  I think this was playing a significant role in his symptoms.  He likely also has an asthmatic component.  I have advised him to try using his symbicort on a more regular basis.  He is to use symbicort one puff two times a day.  He is to continue on proair  2 puffs qid as needed.  I suspect his recent fever was related to a self limited viral syndrome. Orders: Est. Patient Level II (00787)   Medications Added to Medication List This Visit: 1)  Symbicort 160-4.5 Mcg/act Aero (Budesonide-formoterol  fumarate) .SABRA.. 1 puffs two times a day  Complete Medication List: 1)  Proair  Hfa 108 (90 Base) Mcg/act Aers (Albuterol  sulfate) .SABRASABRASABRA  2 puffs qid prn 2)  Symbicort 160-4.5 Mcg/act Aero (Budesonide-formoterol  fumarate) .SABRA.. 1 puffs two times a day 3)  Simvastatin  80 Mg Tabs (Simvastatin ) .... One tablet daily. 4)  Toprol  Xl 25 Mg Tb24 (Metoprolol  succinate) .... One tablet daily. 5)  Lasix  20 Mg Tabs (Furosemide ) .... One tablet two times a day 6)  Adult Aspirin  Ec Low Strength 81 Mg Tbec (Aspirin ) .... One tablet daily. 7)  Methadone  Hcl 10 Mg Tabs (Methadone  hcl) .... Two  tablets four times daily 8)  Percocet 10-325 Mg Tabs (Oxycodone -acetaminophen ) .... Use four tablets daily as needed 9)  Flexeril 5 Mg Tabs (Cyclobenzaprine hcl) .... Take two tabs at bedtime 10)  Klonopin 0.5 Mg Tabs (Clonazepam) .... One tablet at bedtime as needed 11)  Sertraline Hcl 100 Mg Tabs (Sertraline hcl) .... 200 mg daily 12)  Prilosec 20 Mg Cpdr (Omeprazole ) .... One tablet daily. 13)  Diovan 80 Mg Tabs (Valsartan) .... One by mouth once daily   Patient Instructions: 1)  Try symbicort one puff two times a day 2)  Continue proair  2 puffs upto 4 times per day as needed   3)  Follow up in 4 months   ]

## 2011-01-27 NOTE — Progress Notes (Signed)
 Summary: SAMPLE OF MEDS  Phone Note Call from Patient Call back at Home Phone [phone]   [facility] for Call: Talk to Nurse Summary of Call: SAMPLE ATACANDE 8 MG CAN COME BY OFFICE TO PICK UP ON MONDAY.  Initial call taken by: Char Moats,  June 18, 2009 9:56 AM  Follow-up for Phone Call        called pt samples will be lft on MON  Follow-up by: Barnie Molt, CNA,  June 18, 2009 5:13 PM

## 2011-01-27 NOTE — Progress Notes (Signed)
Summary: nuc pre-procedure  Phone Note Outgoing Call   Call placed by: Domenic Polite, CNMT,  October 26, 2010 11:23 AM Call placed to: Patient Reason for Call: Confirm/change Appt Summary of Call: Left message with information on [employer] Information Sheet (see scanned document for details).  Initial call taken by: Domenic Polite, CNMT,  October 26, 2010 11:23 AM     Nuclear Med Background Indications for Stress Test: Evaluation for Ischemia, PTCA Patency  Indications Comments: ^  LE Edema  History: Angioplasty, COPD, Echo, Heart Catheterization, Myocardial Infarction, Myocardial Perfusion Study  History Comments: '96 Inf MI / PTCA , '02 Cath 50-60% RCA /NML LVF, '11 Echo 10/14/10 NML LVF  Symptoms: DOE, SOB    Nuclear Pre-Procedure Cardiac Risk Factors: Hypertension, Lipids, Obesity, Smoker Height (in): 70

## 2011-01-27 NOTE — Letter (Signed)
Summary: Appointment - Reminder 2  [facility], Main Office  [address]   [place], [place] [address]   Phone: [phone]  Fax: [phone]     June 11, 2010 [name]: [id number]   DEVONTAY CELAYA [address] [place], [place]  [address]   Dear Mr. CONLY,  Our records indicate that it is time to schedule a follow-up appointment with Dr. Tenny Craw in August. It is very important that we reach you to schedule this appointment. We look forward to participating in your health care needs. Please contact us at the number listed above at your earliest convenience to schedule your appointment.  If you are unable to make an appointment at this time, give [place] a call so we can update our records.     Sincerely,    Migdalia Dk [facility] Scheduling Team

## 2011-01-27 NOTE — Progress Notes (Signed)
 Summary: SAMPLES  Phone Note Call from Patient   Summary of Call: NEED SAMPLES OF [facility] AND DENTOLIN HFA Initial call taken by: Rollene Lowers,  November 25, 2008 3:29 PM  Follow-up for Phone Call        Samples left up front pt aware Armita [facility], LPN  November 25, 2008 3:50 PM

## 2011-01-27 NOTE — Assessment & Plan Note (Signed)
Summary: 10 MO F/U   Visit Type:  Follow-up Referring Provider:  self-referral Primary Provider:  [facility]   History of Present Illness: Patient is a 65 year old with a history of CAD, COPD, dyslipiidemia, hypertension.  I saw him about 1 year ago.  He now complains of increased LE edema over the past few weeks.  In addition he notes increased SOB.  No chest pains. He was seen by his primary MD.  Lasix was doubled but he notes only mild decrease in edema.  No real change in urination.  Current Medications (verified): 1)  Proair Hfa 108 (90 Base) Mcg/act  Aers (Albuterol Sulfate) .... 2 Puffs Qid Prn 2)  Symbicort 160-4.5 Mcg/act  Aero (Budesonide-Formoterol Fumarate) .Marland Kitchen.. 1 Puffs Two Times A Day 3)  Simvastatin 80 Mg  Tabs (Simvastatin) .... One Tablet Daily. 4)  Toprol Xl 25 Mg  Tb24 (Metoprolol Succinate) .... One Tablet Daily. 5)  Furosemide 20 Mg Tabs (Furosemide) .Marland Kitchen.. 1 Tab Two Times A Day 6)  Adult Aspirin Ec Low Strength 81 Mg  Tbec (Aspirin) .... One Tablet Daily. 7)  Methadone Hcl 10 Mg  Tabs (Methadone Hcl) .... Two  Tablets Four Times Daily 8)  Percocet 10-325 Mg  Tabs (Oxycodone-Acetaminophen) .... Use Four Tablets Daily As Needed 9)  Flexeril 10mg   Tabs (Cyclobenzaprine Hcl) .... Take Two Tabs At Bedtime 10)  Klonopin 1 Mg .... One Tablet At [facility] As Needed 11)  Sertraline Hcl 100 Mg  Tabs (Sertraline Hcl) .... 200 Mg Daily 12)  Prilosec 20 Mg  Cpdr (Omeprazole) .... One Tablet Daily. 13)  Isosorbide Mononitrate Cr 30 Mg Xr24h-Tab (Isosorbide Mononitrate) .... Take 1 Tablet By Mouth Once A Day 14)  Cozaar 50 Mg Tabs (Losartan Potassium) .Marland Kitchen.. 1 Tablet Every Day  Allergies (verified): No Known Drug Allergies  Past History:  Past Medical History: Last updated: 02/25/2010 CAD Hypercholesterolemia GERD Pneumonia Anxiety Chronic back pain ACE induced cough Chronic bronchitis Myocardial Infarction Depression BPH Hiatal Hernia  Past Surgical  History: Last updated: 02/25/2010 Back surgery x 3 Appendectomy Angioplasty  Family History: Last updated: 04/29/2008 Father - emphysema, prostate cancer  Social History: Last updated: 04/29/2008 Married.  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  Quit smoking in 1997.  Smoke 1.5 packs per day since age 34.  Review of Systems       Continues to smoke.  Vital Signs:  Patient profile:   65 year old male Height:      70 inches Weight:      229 pounds BMI:     32.98 BP sitting:   137 / 74  (left arm) Cuff size:   regular  Vitals Entered By: Burnett Kanaris, CNA (October 08, 2010 10:42 AM)  Physical Exam  Additional Exam:  Patient is in NAD HEENT:  Normocephalic, atraumatic. EOMI, PERRLA.  Neck: JVP is normal. No thyromegaly. No bruits.  Lungs: Rhonchi, wheeze.  NO rales. Heart: Regular rate and rhythm. Normal S1, S2. No S3.   No significant murmurs. PMI not displaced.  Abdomen:  Supple, nontender. Normal bowel sounds. No masses. No hepatomegaly.  Extremities:   Good distal pulses throughout. No lower extremity edema.  Musculoskeletal :moving all extremities.  Neuro:   alert and oriented x3.    Impression & Recommendations:  Problem # 1:  CAD (ICD-414.00) SYmptoms are concerning.  I would recomm labs and an echo to evaluate LV function prior to scheduling other test.  Problem # 2:  BRONCHITIS, CHRONIC (  ICD-491.9) Patient with rhonchi on exam.  COntinues to smoke. I would continue inhalers.  Counseled on tobacco.  Problem # 3:  HYPERLIPIDEMIA (ICD-272.4) Conitnue for now.  will need to review. His updated medication list for this problem includes:    Simvastatin 80 Mg Tabs (Simvastatin) ..... One tablet daily.  Other Orders: EKG w/ Interpretation (93000) TLB-CBC Platelet - w/Differential (85025-CBCD) TLB-BMP (Basic Metabolic Panel-BMET) (80048-METABOL) TLB-BNP (B-Natriuretic Peptide) (83880-BNPR) TLB-TSH (Thyroid Stimulating Hormone)  (84443-TSH) Echocardiogram (Echo)  Patient Instructions: 1)  Your physician recommends that you return for lab work in: lab work today..wil call you with results 2)  Your physician has requested that you have an echocardiogram.  Echocardiography is a painless test that uses sound waves to create images of your heart. It provides your doctor with information about the size and shape of your heart and how well your heart's chambers and valves are working.  This procedure takes approximately one hour. There are no restrictions for this procedure. 3)  Your physician recommends that you schedule a follow-up appointment in: 3 months Prescriptions: FUROSEMIDE 40 MG TABS (FUROSEMIDE) take as directed  #100 x 3   Entered by:   Layne Benton, RN, BSN   Authorized by:   Sherrill Raring, MD, [facility]   Signed by:   Layne Benton, RN, BSN on 10/08/2010   Method used:   Electronically to        [facility] (retail)       [address]       [place], [place]  [address]       Ph: [phone]       Fax: [phone]   RxID:   [id number]

## 2011-01-27 NOTE — Progress Notes (Signed)
" °----   Converted from flag ---- ---- 08/12/2008 2:42 PM, Carolynne Allan MD wrote: Jama,  Can you please call Mr. Hawkes to let him know that his breathing test from 08/05 looked okay.  I will discuss this further at his next ROV.  Thanks.  Vineet ------------------------------  Left message on patient's phone (identified). "

## 2011-01-27 NOTE — Miscellaneous (Signed)
 "  Pulmonary Function Test Date: 07/30/2008 Height (in.): 70 Gender: Male  Pre-Spirometry FVC    Value: 3.48 L/min   Pred: 4.57 L/min     % Pred: 76 % FEV1    Value: 2.91 L     Pred: 3.20 L     % Pred: 91 % FEV1/FVC  Value: 84 %     Pred: 70 %     % Pred: . % FEF 25-75  Value: 3.66 L/min   Pred: 3.01 L/min     % Pred: 122 %  Post-Spirometry FVC    Value: 3.56 L/min   Pred: 4.57 L/min     % Pred: 78 % FEV1    Value: 3.03 L     Pred: 3.20 L     % Pred: 95 % FEV1/FVC  Value: 85 %     Pred: 70 %     % Pred: . % FEF 25-75  Value: 3.67 L/min   Pred: 3.01 L/min     % Pred: 122 %  Lung Volumes TLC    Value: 5.34 L   % Pred: 81 % RV    Value: 1.84 L   % Pred: 77 % DLCO    Value: 20.4 %   % Pred: 80 % DLCO/VA  Value: 4.09 %   % Pred: 111 %  Comments: No obstruction.  No bronchodilator response.  Normal lung volumes.  Normal diffusion capacity.  Clinical Lists Changes  Observations: Added new observation of PFT COMMENTS: No obstruction.  No bronchodilator response.  Normal lung volumes.  Normal diffusion capacity. (07/30/2008 13:41) Added new observation of DLCO/VA%EXP: 111 % (07/30/2008 13:41) Added new observation of DLCO/VA: 4.09 % (07/30/2008 13:41) Added new observation of DLCO % EXPEC: 80 % (07/30/2008 13:41) Added new observation of DLCO: 20.4 % (07/30/2008 13:41) Added new observation of RV % EXPECT: 77 % (07/30/2008 13:41) Added new observation of RV: 1.84 L (07/30/2008 13:41) Added new observation of TLC % EXPECT: 81 % (07/30/2008 13:41) Added new observation of TLC: 5.34 L (07/30/2008 13:41) Added new observation of FEF2575%EXPS: 122 % (07/30/2008 13:41) Added new observation of PSTFEF25/75P: 3.01  (07/30/2008 13:41) Added new observation of PSTFEF25/75%: 3.67 L/min (07/30/2008 13:41) Added new observation of PSTFEV1/FCV%: . % (07/30/2008 13:41) Added new observation of FEV1FVCPRDPS: 70 % (07/30/2008 13:41) Added new observation of PSTFEV1/FVC: 85 % (07/30/2008  13:41) Added new observation of POSTFEV1%PRD: 95 % (07/30/2008 13:41) Added new observation of FEV1PRDPST: 3.20 L (07/30/2008 13:41) Added new observation of POST FEV1: 3.03 L/min (07/30/2008 13:41) Added new observation of POST FVC%EXP: 78 % (07/30/2008 13:41) Added new observation of FVCPRDPST: 4.57 L/min (07/30/2008 13:41) Added new observation of POST FVC: 3.56 L (07/30/2008 13:41) Added new observation of FEF % EXPEC: 122 % (07/30/2008 13:41) Added new observation of FEF25-75%PRE: 3.01 L/min (07/30/2008 13:41) Added new observation of FEF 25-75%: 3.66 L/min (07/30/2008 13:41) Added new observation of FEV1/FVC%EXP: . % (07/30/2008 13:41) Added new observation of FEV1/FVC PRE: 70 % (07/30/2008 13:41) Added new observation of FEV1/FVC: 84 % (07/30/2008 13:41) Added new observation of FEV1 % EXP: 91 % (07/30/2008 13:41) Added new observation of FEV1 PREDICT: 3.20 L (07/30/2008 13:41) Added new observation of FEV1: 2.91 L (07/30/2008 13:41) Added new observation of FVC % EXPECT: 76 % (07/30/2008 13:41) Added new observation of FVC PREDICT: 4.57 L (07/30/2008 13:41) Added new observation of FVC: 3.48 L (07/30/2008 13:41) Added new observation of HEIGHT: 70 in (07/30/2008 13:41) Added new observation of PFT DATE: 07/30/2008  (  07/30/2008 13:41)   Appended Document:  Pt was called and told PFT looked ok - Dr. Shellia to discuss at next ROV   "

## 2011-01-27 NOTE — Miscellaneous (Signed)
Summary: Orders Update pft charges  Clinical Lists Changes  Orders: Added new Service order of Carbon Monoxide diffusing w/capacity (94720) - Signed Added new Service order of Lung Volumes (94240) - Signed Added new Service order of Spirometry (Pre & Post) (94060) - Signed 

## 2011-01-27 NOTE — Progress Notes (Signed)
Summary: RX REFILL  Furosemide  Phone Note Refill Request Call back at Home Phone [phone] Message from:  Patient on October 12, 2010 1:09 PM  Refills Requested: Medication #1:  FUROSEMIDE 40 MG TABS take as directed. PT WIFE MARY STATES RX WAS NOT CALLED IN OR SEND IT. PT NEEDS RX. PLEASE CALL IT IN AND CALL PT BACK. [facility]# [phone]   Method Requested: Telephone to Pharmacy Initial call taken by: Roe Coombs,  October 12, 2010 1:09 PM  Follow-up for Phone Call        Furosemide 40 mg tablet #100 with 3 refills; taje as directed called into pharmacy as requested Follow-up by: Charolotte Capuchin, RN,  October 12, 2010 2:54 PM

## 2011-01-27 NOTE — Procedures (Signed)
Summary: Colonoscopy   Colonoscopy  Procedure date:  04/04/2007  Findings:      Results: Normal. Location:  [facility].    Colonoscopy  Procedure date:  04/04/2007  Findings:      Results: Normal. Location:  [facility].   Wade Name: Cheskel, Silverio. [name]:  Procedure Procedures: Colonoscopy CPT: [phone].  Personnel: Endoscopist: Wilhemina Bonito. Marina Goodell, MD.  Exam Location: Exam performed in Outpatient Clinic. Outpatient  Wade Consent: Procedure, Alternatives, Risks and Benefits discussed, consent obtained, from Wade. Consent was obtained by the RN.  Indications Symptoms: Constipation Hematochezia.  Average Risk Screening Routine.  History  Current Medications: Wade is not currently taking Coumadin.  Pre-Exam Physical: Performed Apr 04, 2007. Cardio-pulmonary exam, Rectal exam, HEENT exam , Abdominal exam, Mental status exam WNL.  Comments: Pt. history reviewed/updated, physical exam performed prior to initiation of sedation?yes Exam Exam: Extent of exam reached: Cecum, extent intended: Cecum.  Albert cecum was identified by appendiceal orifice and IC valve. Wade position: on left side. Time to Cecum: 00:04:19. Time for Withdrawl: 00:12:13. Colon retroflexion not performed. Images taken. ASA Classification: II. Tolerance: fair, adequate exam.  Monitoring: Pulse and BP monitoring, Oximetry used. Supplemental O2 given.  Colon Prep Used Movi prep for colon prep. Prep results: excellent.  Sedation Meds: Wade assessed and found to be appropriate for moderate (conscious) sedation. Fentanyl 150 mcg. given IV. Versed 15 given IV. Benadryl  50 given IV.  Comments: no retroflexed view due to Wade intolerance Findings NORMAL EXAM: Cecum to Rectum.   Assessment Normal examination.  Comments: NO POLYPS SEEN Events  Unplanned Interventions: No intervention was required.  Unplanned Events: There were no  complications. Plans Comments: MIRALAX AS NEEDED FOR CONSTIPATION Disposition: After procedure Wade sent to recovery. After recovery Wade sent home.  Scheduling/Referral: Colonoscopy, to Wilhemina Bonito. Marina Goodell, MD, IN 10 YEARS,   Comments: RETURN TO [facility] CARE OF DR Micah Noel  cc:  Angelique Holm, MD      Albert Wade

## 2011-01-27 NOTE — Progress Notes (Signed)
 Summary: samples  Phone Note Call from Patient Call back at Home Phone [phone]   Caller: Patient Call For: sood Reason for Call: Talk to Nurse Summary of Call: need samples  -  Diovan 80mg  Initial call taken by: Jon Crane,  September 12, 2008 8:07 AM  Follow-up for Phone Call        Called spoke with pt's wife advised no samples of Diovan 80mg .  Pt unable to afford medication.  Have samples of Diovan 160mg  advised will have to half tablets and take 1/2 tablet once daily.  Pt's wife understands instructions and will come by and pick up samples.  Follow-up by: Bernice Louder RN,  September 12, 2008 8:57 AM

## 2011-01-27 NOTE — Progress Notes (Signed)
 Summary: SAMPLE OF MEDS  Medications Added ISOSORBIDE MONONITRATE CR 30 MG XR24H-TAB (ISOSORBIDE MONONITRATE) Take 1 tablet by mouth once a day LISINOPRIL 20 MG TABS (LISINOPRIL) Take 1 tablet by mouth once a day ISOSORBIDE MONONITRATE CR 30 MG XR24H-TAB (ISOSORBIDE MONONITRATE) Take 1 tablet by mouth once a  day      Allergies Added: NKDA Phone Note Call from Patient Call back at Home Phone [phone]   Caller: Spouse Reason for Call: Talk to Nurse Summary of Call: SAMPLE OF ATACAND 4 MG, LEAVE MESSAGE ON HOME VOICE MAIL  Initial call taken by: Char Moats,  April 21, 2009 8:39 AM  Follow-up for Phone Call        no samples informed pt Follow-up by: Barnie Molt, CMA,  April 22, 2009 2:37 PM    New/Updated Medications: ISOSORBIDE MONONITRATE CR 30 MG XR24H-TAB (ISOSORBIDE MONONITRATE) Take 1 tablet by mouth once a day LISINOPRIL 20 MG TABS (LISINOPRIL) Take 1 tablet by mouth once a day ISOSORBIDE MONONITRATE CR 30 MG XR24H-TAB (ISOSORBIDE MONONITRATE) Take 1 tablet by mouth once a  day

## 2011-01-27 NOTE — Progress Notes (Signed)
Summary: questions re ech/us  Phone Note Call from Patient   Caller: Patient [phone] Reason for Call: Talk to Nurse Summary of Call: pt was in today, echo was set up, pt thought he was to have a [place] as well, but that wasn't scheduled, if needs it can be the same day Initial call taken by: Glynda Jaeger,  October 08, 2010 2:40 PM  Follow-up for Phone Call        not sure if need anything other than echo as note is not complete, will send to [facility] to review Meredith Staggers, RN  October 08, 2010 3:35 PM   Additional Follow-up for Phone Call Additional follow up Details #1::        Called patient's wife back and advised that he only needs an echo for now. Layne Benton, RN, BSN  October 08, 2010 7:22 PM

## 2011-01-27 NOTE — Assessment & Plan Note (Signed)
Summary: Cardiology Nuclear Testing  Nuclear Med Background Indications for Stress Test: Evaluation for Ischemia, PTCA Patency  Indications Comments: ^  LE Edema  History: Angioplasty, COPD, Echo, Heart Catheterization, Myocardial Infarction, Myocardial Perfusion Study  History Comments: '07 ZOX:WRUEAV; 10/14/10 Echo:normal LVF  Symptoms: Chest Tightness, Diaphoresis, Dizziness, DOE, Fatigue, Light-Headedness, SOB  Symptoms Comments: Last episode of CP:2 weeks ago.   Nuclear Pre-Procedure Cardiac Risk Factors: History of Smoking, Hypertension, Lipids, Obesity Caffeine/Decaff Intake: None NPO After: 6:30 PM Lungs: Clear after nebulizer treatment this a.m.; O2 Sat 94-100% with DB on RA. IV 0.9% NS with Angio Cath: 22g     IV Site: L Antecubital IV Started by: Irean Hong, RN Chest Size (in) 46     Height (in): 70 Weight (lb): 235 BMI: 33.84 Tech Comments: Took metoprolol this am. The patient had audible wheezing at arrival. He used symbicort and proventil inhaler.Lungs fields with expiratory wheezing and  rhonchi. Nebulizer treatment with albuterol solution 5 mg given to patient with clearing of wheezes. Patsy Edwards,RN.  Nuclear Med Study 1 or 2 day study:  1 day     Stress Test Type:  Eugenie Birks Reading MD:  Marca Ancona, MD     Referring MD:  Dietrich Pates, MD Resting Radionuclide:  Technetium 35m Tetrofosmin     Resting Radionuclide Dose:  10.4 mCi  Stress Radionuclide:  Technetium 37m Tetrofosmin     Stress Radionuclide Dose:  33 mCi   Stress Protocol   Lexiscan: 0.4 mg   Stress Test Technologist:  Rea [employer], CMA-N     Nuclear Technologist:  Doyne Keel, CNMT  Rest Procedure  Myocardial perfusion imaging was performed at rest 45 minutes following the intravenous administration of Technetium 63m Tetrofosmin.  Stress Procedure  The patient received IV Lexiscan 0.4 mg over 15-seconds.  Technetium 22m Tetrofosmin injected at 30-seconds.  There were no significant changes  with infusion.  Quantitative spect images were obtained after a 45 minute delay.  QPS Raw Data Images:  Normal; no motion artifact; normal heart/lung ratio. Stress Images:  Normal homogeneous uptake in all areas of the myocardium. Rest Images:  Normal homogeneous uptake in all areas of the myocardium. Subtraction (SDS):  There is no evidence of scar or ischemia. Transient Ischemic Dilatation:  1.06  (Normal <1.22)  Lung/Heart Ratio:  .42  (Normal <0.45)  Quantitative Gated Spect Images QGS EDV:  86 ml QGS ESV:  21 ml QGS EF:  76 % QGS cine images:  Normal wall motion.    Overall Impression  Exercise Capacity: Lexiscan with no exercise. BP Response: Normal blood pressure response. Clinical Symptoms: Flushed ECG Impression: No significant ST segment change suggestive of ischemia. Overall Impression: Normal stress nuclear study.  Appended Document: Cardiology Nuclear Testing Stress test is normal.  Appended Document: Cardiology Nuclear Testing Patient's wife aware.

## 2011-01-27 NOTE — Letter (Signed)
 Summary: Appointment - Reminder 2  [facility], Main Office  [address]   [place], [place] [address]   Phone: [phone]  Fax: [phone]     August 06, 2009 [name]: [id number]   BRODERIC BARA [address] [place], [place]  [address]   Dear Mr. BRESEE,  Our records indicate that you are overdue for a follow-up appointment with Dr. Okey. It is very important that we reach you to schedule this appointment. We look forward to participating in your health care needs.   Please contact us  at the number listed above at your earliest convenience to schedule your appointment.  If you are unable to make an appointment at this time, give us  a call so we can update our records.  Sincerely,   Delon Pae [facility] Scheduling Team

## 2011-01-27 NOTE — Progress Notes (Signed)
 Summary: samples  Phone Note Call from Patient Call back at Home Phone [phone] Call back at [phone]   Caller: wife Call For: sood Summary of Call: wants samples of symbicort and proair  Initial call taken by: Dalton Munroe,  October 23, 2008 12:30 PM  Follow-up for Phone Call        [facility] letting pt know we have put samples up front and they may come by to pick them up. Follow-up by: Katheryn Fell CMA,  October 23, 2008 1:22 PM

## 2011-01-27 NOTE — Progress Notes (Signed)
  Walk in Patient Form Recieved " Pt needs refill" sent to Message Nurse [facility]  October 27, 2010 1:29 PM     Appended Document:  Refilled inhalers...ok per Dr.Ross. Pt's wife is aware.

## 2011-01-27 NOTE — Progress Notes (Signed)
 Summary: MED QUESTION (samples were given 8mg  1/2 tab daily)  Phone Note Call from Patient Call back at Home Phone [phone]   Caller: Spouse Reason for Call: Refill Medication, Talk to Nurse Summary of Call: PT WANTS TO KNOW WHAT THEY CAN DO SINCE THERE ARE NO SAMPLES AVAILABLE IS THERE ANYTHING ELSE. [facility] [place] [phone] Initial call taken by: Warren Ly,  April 22, 2009 3:11 PM  Follow-up for Phone Call        samples of atacand 8mg  were left at front desk for pt. Call and spoke with pt's wife (PT IN BATHROOM) informed her that med was left in front and that pt needed to cut med in half and take one a day@10 :00 am 4.30.2010. Follow-up by: Barnie Molt, CMA,  April 24, 2009 10:31 AM

## 2011-01-27 NOTE — Miscellaneous (Signed)
Summary: Orders Update  Clinical Lists Changes  Orders: Added new Referral order of Nuclear Stress Test (Nuc Stress Test) - Signed Added new Referral order of Pulmonary Function Test (PFT) - Signed  Appended Document: Orders Update Dr. Tenny Craw spoke with pt's wife. PFT, Lexiscan and BMET order for next week. Pt. and wife aware.

## 2011-01-27 NOTE — Progress Notes (Signed)
Summary: can pt take celebrex  Phone Note Call from Patient Call back at Home Phone [phone]   Caller: Spouse Reason for Call: Talk to Nurse Details for Reason: Is it o.k. for pt to take celebrex.  Initial call taken by: Lorne Skeens,  November 01, 2010 2:09 PM  Follow-up for Phone Call        Called patient's wife. She states that the PA for Dr.Chrismon gave him Celebrex 100mg  1 or 2 times per day and she wanted to know if Dr.Tandra Rosado was OK with him taking the medication. Advised I would ask and let her know. She is requesting that we let the pharmacy know about the status [facility] Patient's wife is also asking if we can do a study on his legs to check his circulation because he still has lower extremity edema. Advised will discuss with Dr.Kimyetta Flott and call her back.  Layne Benton, RN, BSN  November 01, 2010 5:15 PM   Additional Follow-up for Phone Call Additional follow up Details #1::        1.  Celebrex:  With cardiac history I am not in support of use  of celebrex. Has increased risk of cardiac events assoc with it. 2.  SOB/edema.  myoview normal.  echo showed mod diastolic dysfunction but BNP normal. PFTs were WNL.  Given all above and continued symptoms I would recomm R and L heart cath to define pressures and anatomy.   Patietn just had BMET today in [place].  Will need to have precath labs   Spoke with patients wife who is agreeable  Will have Nash call. Additional Follow-up by: Sherrill Raring, MD, [facility],  November 04, 2010 12:13 PM

## 2011-01-27 NOTE — Assessment & Plan Note (Signed)
 Summary: no better on meds/apc   Visit Type:  Follow-up Referred by:  self-referral PCP:  [facility]  Chief Complaint:  not any better - fever and wheezing - ? injection in back bothered lungs.  History of Present Illness: I saw Albert Wade in follow up for his chronic cough.  He did not notice any improvement with the prior regimen.  His symptoms consist more of upper airway wheeze, and dry cough.  He does feel he gets some congestion in his chest also.  The proair  seems to help some, but not completely.     Current Allergies: No known allergies       Vital Signs:  Patient Profile:   65 Years Old Male Height:     71 inches Weight:      220.25 pounds O2 Sat:      91 % O2 treatment:    Room Air Temp:     97.6 degrees F oral Pulse rate:   67 / minute BP sitting:   106 / 66  (left arm)  Vitals Entered By: Almarie Erb (June 12, 2008 9:25 AM)             Comments Medications reviewed with patient Almarie Erb  June 12, 2008 9:26 AM      Physical Exam  Nose:     no sinus tenderness, deviated nasal septum Mouth:     has growth around dentures Neck:     wheeze over neck Lungs:     no wheezing or rales Heart:       regular rhythm, normal rate, and no murmurs.   Abdomen:     obese, soft, normal bowel sounds Extremities:     no edema, cyanosis, or clubbing     Impression & Recommendations:  Problem # 1:  BRONCHITIS, CHRONIC (ICD-491.9) He has not had any improvement with the previous regimen.  His symptoms appear to be more upper airway related.  I am concerned that the ACE inhibitor may be contributing to this.  I will stop his lisinopril, and start Diovan 80 mg once daily.  I am concerned he still has some lower airway disease as well.   I will stop his spiriva, and give him a trial of symbicort 160/4.5 two puffs two times a day.  He is to continue on proair  qid as needed.  I will also schedule him for PFT's. Orders: Est.  Patient Level III (00786) Full Pulmomary Function Test (PFT)   Medications Added to Medication List This Visit: 1)  Diovan 80 Mg Tabs (Valsartan) .... One by mouth once daily 2)  Symbicort 160-4.5 Mcg/act Aero (Budesonide-formoterol  fumarate) .... 2 puffs two times a day   Patient Instructions: 1)  Stop lisinopril 2)  Start Diovan 80 mg once daily  3)  Stop Spiriva 4)  Start symbicort 160/4.5 2 puffs two times a day 5)  Continue proair  hfa 2 puffs upto 4 times per day as needed  6)  Will schedule breathing test (PFT) 7)  Follow up in 3 to 4 weeks   Prescriptions: SYMBICORT 160-4.5 MCG/ACT  AERO (BUDESONIDE-FORMOTEROL  FUMARATE) 2 puffs two times a day  #1 x 2   Entered and Authorized by:   Carolynne Allan MD   Signed by:   Carolynne Allan MD on 06/12/2008   Method used:   Electronically sent to ...       [facility] [address] [employer]       [address]  [place], [place]  [address]       Ph: [phone]       Fax: [phone]   RxID:   [id number] DIOVAN 80 MG  TABS (VALSARTAN) one by mouth once daily  #30 x 2   Entered and Authorized by:   Carolynne Allan MD   Signed by:   Carolynne Allan MD on 06/12/2008   Method used:   Electronically sent to ...       [facility] [address] [facility]       [address]       [place], [place]  [address]       Ph: [phone]       Fax: [phone]   RxID:   [id number]  ]

## 2011-01-27 NOTE — Progress Notes (Signed)
Summary: test results  Phone Note Call from Patient Call back at Home Phone [phone]   Caller: pt Reason for Call: Lab or Test Results Summary of Call: pt is calling for sleep study results Initial call taken by: Faythe Ghee,  December 16, 2010 1:59 PM  Follow-up for Phone Call        Called patient's wife with prelim results of sleep study. I advised her that I am waiting to hear back from Dr.Ross to see if he needs a consult with Dr.Clance. Then I will call her back again. Layne Benton, RN, BSN  December 16, 2010 3:37 PM   Additional Follow-up for Phone Call Additional follow up Details #1::        Refer to pulmonary Additional Follow-up by: Sherrill Raring, MD, [facility],  December 16, 2010 9:48 PM

## 2011-01-27 NOTE — Procedures (Signed)
Summary: EGD/[facility]  EGD/[facility]   Imported By: Lester Grainola 03/02/2010 09:29:55  _____________________________________________________________________  External Attachment:    Type:   Image     Comment:   External Document

## 2011-01-27 NOTE — Progress Notes (Signed)
Summary: needs refill but need a 50mg   Phone Note Refill Request Message from:  Patient on January 19, 2011 8:29 AM  Refills Requested: Medication #1:  TOPROL XL 25 MG  TB24 One tablet daily. [facility]  [phone]  Initial call taken by: Judie Grieve,  January 19, 2011 8:30 AM    Prescriptions: TOPROL XL 25 MG  TB24 (METOPROLOL SUCCINATE) One tablet daily.  #30 x 6   Entered by:   Burnett Kanaris, CNA   Authorized by:   Sherrill Raring, MD, [facility]   Signed by:   Burnett Kanaris, CNA on 01/21/2011   Method used:   Electronically to        [facility] (retail)       [address]       [place], [place]  [address]       Ph: [phone]       Fax: [phone]   RxID:   [id number]   Appended Document: needs refill but need a 50mg     Prescriptions: METOPROLOL TARTRATE 50 MG TABS (METOPROLOL TARTRATE) take one-half (1/2) tablet every day as directed  #30 x 3   Entered by:   Burnett Kanaris, CNA   Authorized by:   Sherrill Raring, MD, [facility]   Signed by:   Burnett Kanaris, CNA on 01/21/2011   Method used:   Electronically to        [facility] (retail)       [address]       [place], [place]  [address]       Ph: [phone]       Fax: [phone]   RxID:   [id number]

## 2011-01-27 NOTE — Miscellaneous (Signed)
Summary: Orders Update pft charges  Clinical Lists Changes  Orders: Added new Service order of Carbon Monoxide diffusing w/capacity (94720) - Signed Added new Service order of Lung Volumes (94240) - Signed Added new Service order of Spirometry (Pre & Post) (94060) - Signed 

## 2011-01-27 NOTE — Progress Notes (Signed)
 Summary: LMTCB-Diovan refill  Phone Note Call from Patient   Caller: Spouse Call For: SOOD Summary of Call: NEED PRESCRIPT FOR [facility] [phone] Initial call taken by: Rollene Lowers,  August 08, 2008 1:14 PM  Follow-up for Phone Call        Rx sent electronically to pharmacy.  Attempted to notify pt.  LMOM TCB. Bernice Louder RN  August 08, 2008 2:21 PM   Pt's wife called back.  Made aware rx has been sent to pharmacy. Follow-up by: Bernice Louder RN,  August 08, 2008 3:13 PM      Prescriptions: DIOVAN 80 MG  TABS (VALSARTAN) one by mouth once daily  #30 x 5   Entered by:   Bernice Louder RN   Authorized by:   Carolynne Allan MD   Signed by:   Bernice Louder RN on 08/08/2008   Method used:   Electronically sent to ...       [facility] [address] [facility]       [address]       [place], [place]  [address]       Ph: [phone]       Fax: [phone]   RxID:   [id number] DIOVAN 80 MG  TABS (VALSARTAN) one by mouth once daily  #30 x 5   Entered by:   Bernice Louder RN   Authorized by:   Carolynne Allan MD   Signed by:   Bernice Louder RN on 08/08/2008   Method used:   Electronically sent to ...       [facility]       [address]       [place], [place]  [address]       Ph: [phone]       Fax: [phone]   RxID:   [id number]

## 2011-01-27 NOTE — Progress Notes (Signed)
Summary: recommendation -urologist  Phone Note Call from Patient Call back at Home Phone [phone] Call back at [phone]  (wife's cell)   Caller: wife, Corrie Dandy Call For: Dr. Marina Goodell Reason for Call: Talk to Nurse Summary of Call: would like Dr. Marina Goodell to recommend an urologist Initial call taken by: Vallarie Mare,  December 06, 2010 2:00 PM  Follow-up for Phone Call        Do you have a specific Urologist you would like to recommend?  [facility]  December 06, 2010 2:05 PM   Additional Follow-up for Phone Call Additional follow up Details #1::        Dr. Barron Alvine  Called patient and gave info. above.  Gave number to wife.  [facility] Cage [facility]  December 07, 2010 9:21 AM  Additional Follow-up by: Hilarie Fredrickson MD,  December 06, 2010 2:37 PM

## 2011-02-15 ENCOUNTER — Institutional Professional Consult (permissible substitution): Payer: Self-pay | Admitting: Pulmonary Disease

## 2011-03-08 LAB — POCT I-STAT 3, ART BLOOD GAS (G3+)
Acid-Base Excess: 4 mmol/L — ABNORMAL HIGH (ref 0.0–2.0)
Bicarbonate: 28.7 meq/L — ABNORMAL HIGH (ref 20.0–24.0)
O2 Saturation: 97 %
TCO2: 30 mmol/L (ref 0–100)
pCO2 arterial: 44.6 mmHg (ref 35.0–45.0)
pH, Arterial: 7.417 (ref 7.350–7.450)
pO2, Arterial: 88 mmHg (ref 80.0–100.0)

## 2011-03-08 LAB — POCT I-STAT 3, VENOUS BLOOD GAS (G3P V)
Acid-Base Excess: 2 mmol/L (ref 0.0–2.0)
Acid-Base Excess: 5 mmol/L — ABNORMAL HIGH (ref 0.0–2.0)
Bicarbonate: 29.2 meq/L — ABNORMAL HIGH (ref 20.0–24.0)
Bicarbonate: 31.4 meq/L — ABNORMAL HIGH (ref 20.0–24.0)
O2 Saturation: 65 %
O2 Saturation: 66 %
TCO2: 31 mmol/L (ref 0–100)
TCO2: 33 mmol/L (ref 0–100)
pCO2, Ven: 52.9 mmHg — ABNORMAL HIGH (ref 45.0–50.0)
pCO2, Ven: 54.3 mmHg — ABNORMAL HIGH (ref 45.0–50.0)
pH, Ven: 7.349 — ABNORMAL HIGH (ref 7.250–7.300)
pH, Ven: 7.37 — ABNORMAL HIGH (ref 7.250–7.300)
pO2, Ven: 36 mmHg (ref 30.0–45.0)
pO2, Ven: 36 mmHg (ref 30.0–45.0)

## 2011-03-11 ENCOUNTER — Ambulatory Visit: Payer: Self-pay | Admitting: Internal Medicine

## 2011-04-11 ENCOUNTER — Telehealth: Payer: Self-pay | Admitting: Internal Medicine

## 2011-04-11 NOTE — Telephone Encounter (Signed)
Can leave message Or can call cell second [phone] ,pt's eye sight bothering him, not feeling well, pain in kidney area-pt on potassium -also pt needs order for blood work to [facility] office for pt

## 2011-04-11 NOTE — Telephone Encounter (Signed)
Pt's wife calling regarding lab work for upcoming appoints for her and her husband, Karry--would like to get labs drawn before appoint on 4/30--would like these drawn in [place] office--advised i would find out what lab work was needed and call her back with appoint time in [place]--nt Labs will be drawn in [place] 4/25 at 9;15AM--PT AND WIFE AWARE--NT

## 2011-04-12 NOTE — Telephone Encounter (Signed)
Patient should hav lipid panel, AST and BMET.

## 2011-04-20 ENCOUNTER — Other Ambulatory Visit (INDEPENDENT_AMBULATORY_CARE_PROVIDER_SITE_OTHER): Payer: Medicare Other | Admitting: *Deleted

## 2011-04-20 DIAGNOSIS — R0989 Other specified symptoms and signs involving the circulatory and respiratory systems: Secondary | ICD-10-CM

## 2011-04-21 ENCOUNTER — Other Ambulatory Visit (INDEPENDENT_AMBULATORY_CARE_PROVIDER_SITE_OTHER): Payer: Medicare Other | Admitting: *Deleted

## 2011-04-21 DIAGNOSIS — I2510 Atherosclerotic heart disease of native coronary artery without angina pectoris: Secondary | ICD-10-CM

## 2011-04-21 DIAGNOSIS — Z79899 Other long term (current) drug therapy: Secondary | ICD-10-CM

## 2011-04-21 DIAGNOSIS — E7800 Pure hypercholesterolemia, unspecified: Secondary | ICD-10-CM

## 2011-04-22 LAB — BASIC METABOLIC PANEL WITH GFR
BUN: 11 mg/dL (ref 6–23)
CO2: 29 meq/L (ref 19–32)
Calcium: 9 mg/dL (ref 8.4–10.5)
Chloride: 101 meq/L (ref 96–112)
Creat: 0.91 mg/dL (ref 0.40–1.50)
Glucose, Bld: 111 mg/dL — ABNORMAL HIGH (ref 70–99)
Potassium: 3.6 meq/L (ref 3.5–5.3)
Sodium: 141 meq/L (ref 135–145)

## 2011-04-22 LAB — LIPID PANEL
Cholesterol: 114 mg/dL (ref 0–200)
HDL: 35 mg/dL — ABNORMAL LOW
LDL Cholesterol: 59 mg/dL (ref 0–99)
Total CHOL/HDL Ratio: 3.3 ratio
Triglycerides: 99 mg/dL
VLDL: 20 mg/dL (ref 0–40)

## 2011-04-22 LAB — AST: AST: 23 U/L (ref 0–37)

## 2011-04-23 ENCOUNTER — Encounter: Payer: Self-pay | Admitting: Internal Medicine

## 2011-04-25 ENCOUNTER — Encounter: Payer: Self-pay | Admitting: Internal Medicine

## 2011-04-25 ENCOUNTER — Ambulatory Visit (INDEPENDENT_AMBULATORY_CARE_PROVIDER_SITE_OTHER): Payer: Medicare Other | Admitting: Internal Medicine

## 2011-04-25 DIAGNOSIS — R0602 Shortness of breath: Secondary | ICD-10-CM

## 2011-04-25 DIAGNOSIS — J40 Bronchitis, not specified as acute or chronic: Secondary | ICD-10-CM

## 2011-04-25 DIAGNOSIS — E785 Hyperlipidemia, unspecified: Secondary | ICD-10-CM

## 2011-04-25 DIAGNOSIS — G4730 Sleep apnea, unspecified: Secondary | ICD-10-CM

## 2011-04-25 DIAGNOSIS — I2510 Atherosclerotic heart disease of native coronary artery without angina pectoris: Secondary | ICD-10-CM

## 2011-04-25 DIAGNOSIS — J42 Unspecified chronic bronchitis: Secondary | ICD-10-CM

## 2011-04-25 MED ORDER — MOXIFLOXACIN HCL 400 MG PO TABS: 400.0000 mg | ORAL_TABLET | Freq: Every day | ORAL | Status: AC

## 2011-04-25 NOTE — Patient Instructions (Signed)
Pulmonary consult  Decrease Zocor to 40mg  every day   Start Avelox for 7 days

## 2011-04-26 ENCOUNTER — Telehealth: Payer: Self-pay | Admitting: Internal Medicine

## 2011-04-26 NOTE — Assessment & Plan Note (Addendum)
HDL is a little low.  Stay active.  I have asked him to cut his Zocor back to 40 mg.

## 2011-04-26 NOTE — Assessment & Plan Note (Signed)
He has exacerbation of bronchitis.  I have given him an Rx for Avelox.  He should call if on ABx his symptoms do not improve.

## 2011-04-26 NOTE — Assessment & Plan Note (Signed)
No symptoms to suggest angina. 

## 2011-04-26 NOTE — Telephone Encounter (Signed)
Avelox ? Dosage.  [facility] [phone]

## 2011-04-26 NOTE — Progress Notes (Addendum)
Patient is a 65 year old with a history of CAD (mild by cath in 10/2010), COPD, dyslipidemia and hyptertension.  I saw him in clinic last October. Since seen he has done OK until recently.  He had a URI in December treated with Avelox.  Over the past couple weeks he has had another exacerbation with cough productive of yellow sputum.  He denies chest pains otherwise.  He is SOB now. Note:  He had a sleep study in December showing sleep apnea.  He did not go to pulmonary to f/u. HPI  No Known Allergies  Current Outpatient Prescriptions  Medication Sig Dispense Refill  . albuterol (PROAIR HFA) 108 (90 BASE) MCG/ACT inhaler Inhale 2 puffs into the lungs every 6 (six) hours as needed.        Marland Kitchen aspirin 81 MG tablet Take 81 mg by mouth daily.        . budesonide-formoterol (SYMBICORT) 160-4.5 MCG/ACT inhaler Inhale 2 puffs into the lungs 2 (two) times daily.        . clonazePAM (KLONOPIN) 1 MG tablet Take 1 mg by mouth at bedtime as needed.        . cyclobenzaprine (FLEXERIL) 10 MG tablet Take 20 mg by mouth at bedtime.        . furosemide (LASIX) 40 MG tablet Take 40 mg by mouth as directed.        . isosorbide mononitrate (IMDUR) 30 MG 24 hr tablet Take 30 mg by mouth daily.        Marland Kitchen losartan (COZAAR) 50 MG tablet Take 50 mg by mouth daily.        . methadone (DOLOPHINE) 10 MG tablet Take 10 mg by mouth every 6 (six) hours.        . metoprolol (LOPRESSOR) 50 MG tablet Take 25 mg by mouth 2 (two) times daily.        Marland Kitchen omeprazole (PRILOSEC) 20 MG capsule Take 20 mg by mouth daily.        Marland Kitchen oxyCODONE-acetaminophen (PERCOCET) 10-325 MG per tablet Take 1 tablet by mouth every 4 (four) hours as needed.        . potassium chloride SA (K-DUR,KLOR-CON) 20 MEQ tablet Take 20 mEq by mouth daily.        . sertraline (ZOLOFT) 100 MG tablet Take 200 mg by mouth daily.        . simvastatin (ZOCOR) 80 MG tablet One half tab every day       . moxifloxacin (AVELOX) 400 MG tablet Take 1 tablet (400 mg total) by  mouth daily.  7 tablet  0    Past Medical History  Diagnosis Date  . CAD (coronary artery disease)   . Hypercholesteremia   . GERD (gastroesophageal reflux disease)   . Pneumonia   . Anxiety   . Chronic back pain   . Chronic bronchitis   . MI (myocardial infarction)   . Depression   . Hiatal hernia   . BPH (benign prostatic hypertrophy)     Past Surgical History  Procedure Date  . Back surgery     x3  . Appendectomy   . Angioplasty     Family History  Problem Relation Age of Onset  . Prostate cancer    . Emphysema      History   Social History  . Marital Status: Married    Spouse Name: N/A    Number of Children: N/A  . Years of Education: N/A   Occupational History  .  retired Naval architect    Social History Main Topics  . Smoking status: Former Smoker -- 1.5 packs/day    Quit date: 12/27/1995  . Smokeless tobacco: Not on file  . Alcohol Use: Not on file  . Drug Use: Not on file  . Sexually Active: Not on file   Other Topics Concern  . Not on file   Social History Narrative  . No narrative on file    Review of Systems:  All systems reviewed.  They are negative to the above problem except as previously stated.  Vital Signs: BP 113/69  Pulse 70  Resp 18  Ht 5\' 11"  (1.803 m)  Wt 214 lb 6.4 oz (97.251 kg)  BMI 29.90 kg/m2  Physical Exam  HEENT:  Normocephalic, atraumatic. EOMI, PERRLA.  Neck: JVP is normal. No thyromegaly. No bruits.  Lungs:  Rhonchi and pops bilaterally.  Moving air.  Heart: Regular rate and rhythm. Normal S1, S2. No S3.   No significant murmurs. PMI not displaced.  Abdomen:  Supple, nontender. Normal bowel sounds. No masses. No hepatomegaly.  Extremities:   Good distal pulses throughout. No lower extremity edema.  Musculoskeletal :moving all extremities.  Neuro:   alert and oriented x3.  CN II-XII grossly intact.  EKG:  SR.  61 bpm.   Assessment and Plan:

## 2011-04-26 NOTE — Assessment & Plan Note (Signed)
Referral to pulmonary

## 2011-04-26 NOTE — Progress Notes (Signed)
Left message to call back  

## 2011-05-10 ENCOUNTER — Encounter: Payer: Self-pay | Admitting: Pulmonary Disease

## 2011-05-10 NOTE — Assessment & Plan Note (Signed)
[facility]                            CARDIOLOGY OFFICE NOTE   Albert Wade, Albert Wade                     [name]:          [id number]  DATE:10/11/2007                            DOB:          May 20, 1946    IDENTIFICATION:  Albert Wade is a 65 year old gentleman who was  previously followed by Glennon Hamilton.  He was last seen in Cardiology  Clinic actually back in February.  He has a history of myocardial  infarction 1996 treated with lytic therapy and is status post PTCA in  [facility] (circumflex artery).  RCA and LAD had no significant disease.  Repeat cardiac catheterization 2002 revealed 50% - 60% lesion in the RCA  and otherwise no significant lesions.  Normal LV function.  Since seeing  the patient denies any chest pain like he had with his MI (neck, arm,  chest, jaw).  He has continued to quit tobacco which is good.  He has  some shortness of breath with activity but no real increase in this.  He  is trying to lose weight.   CURRENT MEDICATIONS:  1. Lisinopril 20.  2. Aspirin 81.  3. Lasix 20 b.i.d.  4. Methadone 10.  5. Klonopin 0.5.  6. Prilosec 20.  7. Flexeril 5 four times per day.  8. Sertraline 200 daily.  9. Toprol XL 25 daily.  10.Simvastatin 80.   PHYSICAL EXAMINATION:  The patient is in no distress.  Blood pressure is  101/60, pulse is 63, weight 213.  LUNGS:  Clear.  NECK:  JVP is normal.  CARDIAC:  Regular rate and rhythm, S1-S2, no S3, no murmurs.  ABDOMEN:  Obese, nontender, no hepatomegaly.  EXTREMITIES:  No significant edema.   A 12 lead EKG shows normal sinus rhythm, 61 beats per minute, possible  inferior wall MI.   IMPRESSION:  1. Coronary artery disease, remains asymptomatic, would continue.  2. Dyslipidemia, will need to check lipids.  Patient is trying to lose      weight.  I will refrain from checking until the Spring.  3. Chronic low back pain, on therapy.  4. Hypertension, on therapy, stable.   I will set to  see the patient back in 6 month's time, sooner if problems  develop.   ADDENDUM   Patient congratulated on his continued smoking cessation.     Pricilla Riffle, MD, [facility]  Electronically Signed    PVR/MedQ  DD: 10/14/2007  DT: 10/15/2007  Job #: [id number]

## 2011-05-10 NOTE — Assessment & Plan Note (Signed)
[facility]                            CARDIOLOGY OFFICE NOTE   Albert Wade, Albert Wade                     [name]:          [id number]  DATE:04/24/2008                            DOB:          1946-05-10    IDENTIFICATION:  Albert Wade is a 65 year old gentleman. I last saw him  in October of last year previously followed by Glennon Hamilton, MD.   The patient had a history of an MI in 1996, treated with lytic therapy  and PTCA (circumflex artery in [facility] and RCA/LAD without significant  disease. In 2002 repeat catheterization showed a 50-60% narrowing in the  RCA.   His last Myoview was in 2007 that showed no ischemia or scar.   Since seen he has been having some problems with shortness of breath.  He attributes to steroid injection. He does have cough with some  wheezing. His cough is productive of greenish sputum.  Note, he is due  to see Dr. Craige Cotta later next week.   CURRENT MEDICATIONS:  1. Lisinopril 20.  2. Aspirin 81.  3. Lasix 20 b.i.d.  4. Methadone 10 q.4.  5. Klonopin 0.5 t.i.d.  6. Prilosec 20.  7. Flexeril 5 four times per day.  8. Sertraline 200 b.i.d.  9. Toprol XL 25 daily.  10.Simvastatin 80.   PHYSICAL EXAM:  The patient is in no distress.  His blood pressure is  125/71, pulse is 62, weight 221 up from 213 on last visit.  LUNGS:  Show with mild upper airway wheezes bilaterally.  Positive  rhonchi.  CARDIAC:  Regular rate and rhythm, S1-S2, no S3.  ABDOMEN: Abdomen is benign, muscles are tense, no hepatomegaly.  EXTREMITIES:  No edema.   IMPRESSION:  1. Coronary artery disease.  The patient has known disease.  I am not      convinced that his current symptoms are related to ischemia.  He      does have some wheezing on exam. I think it would be important for      him to be evaluated in pulmonary which he is already scheduled.  I      would keep his medicines the same for now.  2. Dyslipidemia.  Last lipid panel done in March  of last year, LDL was      76.  Will need to make sure he has a repeat.   Otherwise,  I will set follow-up for  October, November when his wife  comes, sooner if problems develop.   ADDENDUM:  A 12-lead EKG sinus rhythm 65 beats per minute.     Pricilla Riffle, MD, [facility]  Electronically Signed    PVR/MedQ  DD: 04/24/2008  DT: 04/24/2008  Job #: [id number]

## 2011-05-10 NOTE — Assessment & Plan Note (Signed)
[facility]                            CARDIOLOGY OFFICE NOTE   Albert Wade, Albert Wade                     [name]:          [id number]  DATE:01/12/2009                            DOB:          Dec 24, 1946    IDENTIFICATION:  [name] is a 65 year old gentleman who I saw last in  October.  He has a history of CAD, COPD, hypertension, and dyslipidemia.   When I saw him last, because of problems paying for Diovan I switched  him to Norvasc.  He said since he has been seen, he blames his Zetia  increased swelling and numbness in big toes and he has stopped Zetia,  still taking the Norvasc (he thinks).  His breathing is relatively  unchanged. Since stopping Zetia, he says his swelling has improved.   CURRENT MEDICINES:  Include  1. Diovan 80.  2. Lasix 20 b.i.d.  3. Methadone 10 as directed.  4. Klonopin 0.5 t.i.d.  5. Prilosec 20.  6. Flexeril 5.  7. Sertraline 200 daily.  8. Toprol-XL 25 daily.  9. Simvastatin 80.  10.Symbicort 160/4.5 two puffs b.i.d.  11.Albuterol inhaler q.i.d.  12.Norvasc 5 daily.   PHYSICAL EXAMINATION:  GENERAL:  The patient is in no distress.  VITAL SIGNS:  Blood pressure is 147/86, pulse is 71, weight 226, and  relatively stable.  LUNGS:  Some decreased air flow.  No wheezes or rales.  CARDIAC:  Regular rate and rhythm.  S1 and S2.  No significant murmurs.  ABDOMEN:  Obese.  EXTREMITIES:  No significant edema.  2+ pulses.   IMPRESSION:  1. Hypertension, I am not sure if he confused his medicines or not,      may indeed be the Norvasc he discontinued.  I have given him      samples of Diovan 160 to cut in half.  We will try to provide him      with samples of some sort of ARB until Cozaar becomes generic,      which should be later this year.  Continue on other medicines.  2. Coronary artery disease.  No evidence for an acute ischemia.  We      would continue medical therapy.  3. Pulmonary.  Again he has followup.  4.  Dyslipidemia, keep off Zetia for now.  I would continue him on the      simvastatin.  We will need followup to make sure his lower      extremity symptoms are answered.   I will set followup for later in the summer.     Pricilla Riffle, MD, [facility]  Electronically Signed    PVR/MedQ  DD: 01/12/2009  DT: 01/13/2009  Job #: [id number]

## 2011-05-10 NOTE — Assessment & Plan Note (Signed)
[facility]                            CARDIOLOGY OFFICE NOTE   Albert Wade, Albert Wade                     [name]:          [id number]  DATE:10/20/2008                            DOB:          August 31, 1946    IDENTIFICATION:  Albert Wade is a 65 year old gentleman with history of  CAD (status post MI in 1996 received lytic therapy and PTCA to  circumflex).  RCA and LAD at the time without significant disease.  Last  catheterization in 2002 showed a 50-60% narrowing in the RCA.  I last  saw him back in April.  Note, a Myoview in 2007 showed no ischemia.   Since seen, the patient has actually been followed by Dr. Craige Cotta in  Pulmonary.  He was taken off lisinopril and it sounds like his breathing  symptoms are some better.  He still has Symbicort and albuterol inhalers  as well.  Again, he thinks things are improved.  He still notes some  wheezing.  Denies chest pain with exertion.   MEDICATIONS:  1. Aspirin 81.  2. Lasix 20 b.i.d.  3. Methadone 10 as directed.  4. Klonopin 0.5 t.i.d.  5. Prilosec 20.  6. Flexeril 5 q.i.d.  7. Sertraline 200 daily.  8. Toprol-XL 25.  9. Simvastatin 80.  10.Thiamine 80.  11.Symbicort 145/4.5 two puffs a day.  12.Albuterol inhaler q.i.d. p.r.n.   PHYSICAL EXAMINATION:  GENERAL:  The patient is in no distress at rest.  VITAL SIGNS:  Blood pressure is 121/62, pulse is 63, weight 225.  NECK:  Lower airways moving air well.  No wheezes or rales.  Does have  some upper airway wheeze, mild.  CARDIAC:  Regular rate and rhythm, S1 and S2, no S3.  No significant  murmurs.  ABDOMEN:  Obese, benign.  EXTREMITIES:  Trace edema.   IMPRESSION:  1. Coronary artery disease.  Overall I think, he is fairly      asymptomatic from this.  I do not see any signs of active ischemia.      I would continue on current regimen.  2. Pulmonary.  I still think he has some wheezing issues and I have      told him to go up on his Prilosec to twice  a day.  Followup with      Dr. Craige Cotta.  3. Hypertension.  The patient is having a hard time paying for the      Diovan.  I will switch him once he has finished this to Norvasc.  I      will follow up in 4 months with blood pressure.  He will also have      a check locally.  4. Dyslipidemia.  Last lipid panel in July and August showed LDL of      83, but 1364 particles,      many of them small.  I would add Zetia because he does admit to      having a bad diet.  We will follow up lipids in 10-12 weeks.     Pricilla Riffle, MD, [facility]  Electronically Signed  PVR/MedQ  DD: 10/20/2008  DT: 10/21/2008  Job #: [id number]

## 2011-05-11 ENCOUNTER — Other Ambulatory Visit: Payer: Self-pay | Admitting: *Deleted

## 2011-05-11 MED ORDER — FUROSEMIDE 40 MG PO TABS: 40.0000 mg | ORAL_TABLET | ORAL | Status: DC

## 2011-05-13 ENCOUNTER — Encounter: Payer: Self-pay | Admitting: Pulmonary Disease

## 2011-05-13 ENCOUNTER — Ambulatory Visit (INDEPENDENT_AMBULATORY_CARE_PROVIDER_SITE_OTHER): Payer: Medicare Other | Admitting: Pulmonary Disease

## 2011-05-13 VITALS — BP 130/82 | HR 62 | Temp 98.2°F | Ht 70.0 in | Wt 214.0 lb

## 2011-05-13 DIAGNOSIS — G4733 Obstructive sleep apnea (adult) (pediatric): Secondary | ICD-10-CM

## 2011-05-13 HISTORY — DX: Obstructive sleep apnea (adult) (pediatric): G47.33

## 2011-05-13 NOTE — Procedures (Signed)
[facility]  Patient:    Albert Wade, Albert Wade                     [name]: [id number] Proc. Date: 10/02/00 Adm. Date:  [id number] Attending:  Estella Husk CC:         Dr. Devin Going T. Pleas Koch., M.D. [facility]   Procedure Report  PROCEDURE:  Esophagogastroduodenoscopy with endoscopic removal of food impaction.  SURGEON:  Wilhemina Bonito. Eda Keys., M.D.  INDICATIONS:  Acute food impaction.  HISTORY:  This is a 64 year old gentleman with a history of coronary artery disease, prior myocardial infarction, and gastroesophageal reflux disease who underwent upper endoscopy with Dr. Russella Dar in 1996, for evaluation of chest pain.  He was found to have a hiatal hernia.  He has not been seen since.  He reports intermittent solid food dysphagia for which he has not brought to medical attention.  Last evening, he was eating a New York strip steak and developed acute dysphagia.  He has since been ________.  He is now for endoscopy with endoscopic removal of meat impaction.  The nature of the procedure as well as the risks, benefits, and alternatives were reviewed.  He understood and agreed to proceed.  PHYSICAL EXAMINATION:  A slightly uncomfortable appearing male in no acute distress.  He is alert and oriented.  Vital signs are stable.  Lungs are clear.  The heart is regular.  Abdomen is soft and nontender.  DESCRIPTION OF PROCEDURE:  After informed consent was obtained, the patient was sedated with 100 mcg of fentanyl and 10 mg of Versed IV.  The Olympus endoscope was passed orally under direct vision into the esophagus.  The proximal esophagus was normal.  The mid esophagus revealed pooled secretions. These were suctioned.  The distal esophagus revealed sausage shaped food impaction.  This was easily advanced into the stomach with gentle pressure. The distal esophagus revealed macerated mucosa with benign stricture at 38 cm from the incisors.  The  stricture was fibrous and concentric.  The stomach revealed a small sliding hiatal hernia, but was otherwise normal.  The duodenal bulb and postbulbar duodenum were normal.  IMPRESSION:  Gastroesophageal reflux disease complicated by peptic stricture with acute food impaction, status post endoscopic removal.  RECOMMENDATIONS: 1. NPO for two hours and then clear liquids for 24 hours and then soft diet    until follow up dilation. 2. Prevacid 30 mg p.o. q.d. 3. Upper endoscopy with Savary dilation of the esophagus, October 06, 2000, at    9 a.m. with myself per patient request. DD:  10/02/00 TD:  10/03/00 Job: [id number]

## 2011-05-13 NOTE — Assessment & Plan Note (Signed)
[facility]                         GASTROENTEROLOGY OFFICE NOTE   Kaz, Auld JOAL EAKLE                     [name]:          [id number]  DATE:03/13/2007                            DOB:          Jun 19, 1946    Patient is self referred.   REASON FOR CONSULTATION:  Constipation, rectal bleeding, and fatigue.   HISTORY:  This is a pleasant 65 year old white male with coronary artery  disease, hypertension, hyperlipidemia, chronic back pain, and  gastroesophageal reflux disease complicated by peptic stricture with  food impaction for which he has undergone prior upper endoscopy with  esophageal dilation.  He is accompanied today by his wife.  He was last  evaluated in the office in May of 2002.  He has chronic constipation for  which he takes stool softeners and what sounds like over-the-counter  laxatives.  He has had difficulties with his bowels since the need for  chronic narcotics to address his back pain.  Over the past 3-1/2 weeks,  he has felt tired, fatigued.  He has not brought this to the attention  of Dr. Micah Noel.  He was wondering whether his symptoms may be due to his  difficulties with defecation.  For the patient's reflux disease he takes  Prilosec.  On the medication, no problems with pyrosis or recurrent  dysphagia.  When he is constipated, he has complaints of bloating and  discomfort.  He will intermittently notice blood per rectum on the  tissue and in the bowl.  He has had no change in his appetite or weight.   PAST MEDICAL HISTORY:  As above.   PAST SURGICAL HISTORY:  1. Back surgery x3.  2. Appendectomy.   ALLERGIES:  NO KNOWN DRUG ALLERGIES.   CURRENT MEDICATIONS:  1. Lisinopril 20 mg daily.  2. Aspirin 81 mg daily.  3. Lasix 20 mg b.i.d.  4. Methadone 10 mg q.4 h.  5. Clonipin 0.5 mg t.i.d.  6. Prilosec 20 mg daily.  7. Flexeril 5 mg 4 times daily.  8. Sertraline 200 mg daily.  9. Toprol 25 mg daily.  10.Simvastatin 80  mg daily.  11.Dulcolax p.r.n.  12.Percocet p.r.n.   FAMILY HISTORY:  No family history of colon cancer.  Father with  prostate cancer.   SOCIAL HISTORY:  Patient is married.  He is accompanied by his wife.  They have 3 children.  He was employed previously as a Naval architect,  but, unfortunately, is disabled due to his back condition.  He does not  smoke or use alcohol.   REVIEW OF SYSTEMS:  Per diagnostic evaluation form.   PHYSICAL EXAMINATION:  Patient is alert and oriented an in no acute  distress.  His blood pressure is 110/66, heart rate 88 and regular, weight is 206  pounds.  He is 5 feet 10-1/2 inches in height.  HEENT:  Sclerae anicteric, conjunctivae are pink.  Oral mucosa is  intact.  There is no adenopathy.  Lungs are clear.  Heart is regular.  ABDOMEN:  Obese and soft without tenderness or mass.  He has a small  umbilical hernia.  RECTAL EXAM:  Deferred.  EXTREMITIES:  Without edema.   IMPRESSION:  This is a 65 year old gentleman with multiple medical  problems who presents with chronic stable constipation and intermittent  rectal bleeding.  Also, nonspecific complaints of fatigue over the past  several weeks.   RECOMMENDATIONS:  1. Colonoscopy with polypectomy, if necessary, to evaluate      constipation, bleeding, as well as provide neoplasia screening.      The nature of the procedure as well as its risks, benefits, and      alternatives were discussed in great detail, he understood and      agreed to proceed.  2. Prescribe MiraLax 17 g in 8 oz of water daily for constipation.      Titrate to need.  3. Screening laboratories to evaluate fatigue.  These to include CBC,      thyroid stimulating hormone, and comprehensive metabolic panel.  I      emphasized to the patient and his wife that they should return to      Dr. Micah Noel for further evaluation of fatigue if no obvious      gastrointestinal cause is identified.  They are agreeable.     Wilhemina Bonito. Marina Goodell,  MD  Electronically Signed    JNP/MedQ  DD: 03/13/2007  DT: 03/13/2007  Job #: [id number]   cc:   Angelique Holm, MD  Cecil Cranker, MD, [facility]

## 2011-05-13 NOTE — Assessment & Plan Note (Signed)
[facility]                              CARDIOLOGY OFFICE NOTE   Keisuke, Hollabaugh BURRELL HODAPP                     [name]:          [id number]  DATE:08/10/2006                            DOB:          [birth year]    The patient is a very pleasant 65 year old, mildly obese, white male with  prior myocardial infarction in 1996, treated with lytic therapy and status  post PTCA in [place], [place].  Subsequent cardiac catheterization, January 02, 2001, revealing 50% RCA, but no significant left coronary system disease  and normal LV.   The patient has chronic low back pain, has been under significant stress.  He has no cardiac symptoms at this time, except for postural dizziness.   CURRENT MEDICATIONS:  1. He is on lisinopril 20.  2. Lipitor 40, aspirin 81.  3. Furosemide 20 twice daily.  4. Methadone 10 every 40 hours.  5. Klonopin 0.5 three times a day.  6. Prilosec.  7. Flexeril 5.  8. Toprol XL 50.   PHYSICAL EXAMINATION:  VITAL SIGNS:  Blood pressure 99/58, pulse 49, status  bradycardia.  When we did formal orthotic pressures, his lying pressure was  106/61, immediate standing was 93/57, five minutes later 96/62, pulse raised  from 51 to 61.  GENERAL APPEARANCE:  Normal.  NECK:  JVP is not elevated.  Carotid pulses __________ .  LUNGS:  Clear.  CARDIAC EXAM:  Normal.  EXTREMITIES:  Revealed no edema.   IMPRESSION:  Diagnosis above.  The patient is getting along well.  I have  suggested, because of the postural hypotension, he decrease the Toprol XL to  25 and discontinue the Isordil.  We will see him back in four months or  p.r.n.  If symptoms are not better, I will further reduce his medications.   I should note his recent LDL was 42.  His EKG reveals sinus bradycardia,  rate 49, otherwise normal.                              E. Graceann Congress, MD, [facility]    EJL/MedQ  DD:  08/10/2006  DT:  08/10/2006  Job #:  [id number]   cc:   Micah Noel, Dr.

## 2011-05-13 NOTE — Patient Instructions (Signed)
Will start on cpap and see how things go.  Please call me if having tolerance issues.  Work on weight loss. followup with me in 5-6weeks.

## 2011-05-13 NOTE — Cardiovascular Report (Signed)
[facility]  Patient:    Albert Wade, Albert Wade                     [name]: [id number] Proc. Date: 01/02/01 Adm. Date:  [id number] Attending:  Ronaldo Miyamoto CC:         Steele Sizer, M.D., Cheree Ditto, [place]  Cecil Cranker, M.D. [facility]  Cardiac Catheterization Lab   Cardiac Catheterization  INDICATIONS:  [name] is a 65 year old who has had a previous inferior MI treated with lytic therapy and then subsequent angioplasty in [place].  He is now seen back with somewhat atypical chest discomfort.  He has a mildly abnormal Cardiolite scan.  Based on the Cardiolite scan, catheterization was recommended.  The Cardiolite scan suggested a small area of mild apical ischemia.  He was brought to the catheterization lab for further evaluation.  DESCRIPTION OF PROCEDURE:  The patient was brought to the catheterization lab and prepped and draped in the usual fashion.  His blood pressure was slightly lower at 90/60.  We therefore gave him some intravenous fluids. Ventriculography was performed in the RAO projection.  Views of the left coronary artery were then obtained.  There was some slight damping of the catheter in the left main, and the vessels appeared to be rather constricted. We elected then to continue to volume-load.  We took pictures of the right coronary artery which also appeared to be severely constricted.  We then gave some intracoronary nitroglycerin, and there was marked dilatation of the vessel.  Likewise, the left coronary artery was dilated, and we used a JL4 5-French catheter.  The patient tolerated the procedure without complication.  HEMODYNAMIC DATA:  The central aortic pressure was 110/63, LV pressure 116/24. There was no gradient on pullback across the aortic valve.  ANGIOGRAPHIC DATA:  Ventriculography in the RAO projection reveals well-preserved global systolic function.  No segmental abnormalities or contraction were identified.   Ejection fraction was 62%.  1. The left main was free of critical disease. 2. The left anterior descending artery coursed to the apex.  There were 3    diagonal branches.  Other than minor luminal irregularity, no significant    focal stenoses were noted. 3. The circumflex consisted of a large marginal branch which bifurcated in a    posterolateral branch.  No significant focal abnormalities were seen. 4. The right coronary artery demonstrates, after dilatation with intracoronary    nitroglycerin, tandem stenoses of 50%, 60%, and 50% in the mid and mid    distal vessel.  The posterior descending and posterolateral branches were    without critical disease.  CONCLUSIONS: 1. Normal LV function. 2. No significant stenoses in the left coronary artery. 3. 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  DISPOSITION:  The patients symptoms are not typical; however, he does have several lesions in the right.  The right is a relatively small caliber vessel and would require a 2.5-mm stent.  In addition, repair of the proximal lesions would require about 30 mm of stent as well as a stent in the distal vessel requiring an additional 15-20 mm.  Target vessel revascularization would be moderately high given the lesion length and small vessel size.  Prior to this, we will discuss the options with Dr. Glennon Hamilton before consideration of percutaneous intervention.  A continued medical therapy would also be a significant option.  I would  be reluctant to recommend surgery with the status of his left coronary artery. DD:  01/02/01 TD:  01/02/01 Job: [id number]

## 2011-05-13 NOTE — Progress Notes (Signed)
  Subjective:    Patient ID: Albert Wade, male    DOB: September 03, 1946, 65 y.o.   [name]: [id number]  HPI The pt is a 65y/o male who I have been asked to see for management of osa.  He has had a recent split night study, with complex apnea being noted at an AHI 21/hr.  He was then titrated to a final pressure of 13cm, with adequate control of both his central and obstructive events.  His history is signficant for the following: -snoring and abnormal breathing pattern during sleep per wife -frequent awakenings at night, but has severe chronic pain -bed at 10-12am, awakens 6am to start day.  -nonrestorative sleep with significant daytime sleepiness with periods of inactivity.  However, takes strong pain meds during the day.  -weight down 25 pounds past 2 yrs, and epworth score today is 10.      Review of Systems  Constitutional: Negative for fever and unexpected weight change.  HENT: Positive for congestion. Negative for ear pain, nosebleeds, sore throat, rhinorrhea, sneezing, trouble swallowing, dental problem, postnasal drip and sinus pressure.   Eyes: Negative for redness and itching.  Respiratory: Positive for shortness of breath. Negative for cough, chest tightness and wheezing.   Cardiovascular: Positive for leg swelling. Negative for palpitations.  Gastrointestinal: Negative for nausea and vomiting.  Genitourinary: Negative for dysuria.  Musculoskeletal: Positive for joint swelling.  Skin: Negative for rash.  Neurological: Negative for headaches.  Hematological: Does not bruise/bleed easily.  Psychiatric/Behavioral: Negative for dysphoric mood. The patient is nervous/anxious.        Objective:   Physical Exam Constitutional:  Well developed, no acute distress  HENT:  Nares patent without discharge, with deviated septum to left and narrowing.                                               Oropharynx without exudate, palate and uvula are moderately elongated.  Eyes:  Perrla, eomi, no  scleral icterus  Neck:  No JVD, no TMG  Cardiovascular:  Normal rate, regular rhythm, no rubs or gallops.  No murmurs        Intact distal pulses  Pulmonary :  Normal breath sounds, no stridor or respiratory distress   No rales, rhonchi, or wheezing  Abdominal:  Soft, nondistended, bowel sounds present.  No tenderness noted.   Musculoskeletal:  mild lower extremity edema noted.  Lymph Nodes:  No cervical lymphadenopathy noted  Skin:  No cyanosis noted  Neurologic:  Alert, appropriate, moves all 4 extremities without obvious deficit.         Assessment & Plan:

## 2011-05-13 NOTE — Op Note (Signed)
[facility]  Patient:    Albert Wade, Albert Wade                     [name]: [id number] Proc. Date: 10/06/00 Adm. Date:  [id number] Attending:  Estella Husk CC:         Vonita Moss, M.D.   Operative Report  PROCEDURE:  Esophagogastroduodenoscopy with Savary (guide wire), dilation of the esophagus with fluoroscopic assistance.  INDICATIONS:  Symptomatic peptic stricture.  HISTORY OF PRESENT ILLNESS:  This is a 65 year old gentleman who was evaluated four days ago with an acute food impaction.  This was relieved endoscopically. He was found to have macerated distal esophagus with a benign stricture at 38 cm from the incisors.  He was since placed on Prevacid 30 mg daily and a modified diet.  He is now for upper endoscopy with esophageal dilation.  The nature of the procedure as well as the risks, benefits, and alternatives have been reviewed.  He understood and wishes to proceed.  PHYSICAL EXAMINATION:  Well-appearing male in no acute distress.  He is alert and oriented.  Vital signs are stable.  Lungs are clear.  Heart is regular. Abdomen soft.  DESCRIPTION OF PROCEDURE:  After informed consent was obtained, the patient was sedated with 100 mg of Demerol and 12 mg of Versed IV.  The Olympus endoscope was passed orally under direct vision into the esophagus.  The esophagus revealed a healing esophagitis at the gastroesophageal junction.  No Barretts esophagus.  Underlying stricture was apparent.  The endoscope passed beyond this region without resistance.  A small sliding hiatal hernia was present.  The stomach, duodenal bulb, and postbulbar duodenum were normal.  THERAPY:  After completing the endoscopic survey, a Savary guide wire was placed into the gastric antrum under fluoroscopic control.  The endoscope was reviewed with the guide wire maintained in position.  Subsequently, 17- and 18-mm dilators were passed sequentially over the guide wire.   Fluoroscopy was utilized at each portion of each dilation.  No resistance was encountered with the passage of either dilator.  No heme present upon removal of either dilator.  The patient tolerated the procedure well.  IMPRESSION:  Gastroesophageal reflux disease complicated by peptic stricture status post Savary dilation to a maximum diameter of 18 mm.  RECOMMENDATIONS: 1. N.p.o. for two hours, then clear liquids for two hours, then soft diet    until a.m. 2. Continue Prevacid 30 mg p.o. q day. 3. General medical followup with Dr. Dossie Arbour. 4. GI followup as needed for recurrent symptoms. DD:  10/06/00 TD:  10/08/00 Job: [id number]

## 2011-05-13 NOTE — Assessment & Plan Note (Signed)
The pt has moderate complex apnea by his sleep study, but responded fairly well to cpap.  Treatment with cpap or dental appliance while working on weight loss would be his best options.  I have recommended that he try cpap first, and he agrees.  I have also explained to him it is unclear how much this will improve his sleep and daytime alertness.   He has chronic pain that disrupts his sleep at night, and takes significant pain meds during the day which can contribute to EDS.

## 2011-05-13 NOTE — Assessment & Plan Note (Signed)
[facility]                            CARDIOLOGY OFFICE NOTE   Gildardo, Tickner JOAS MOTTON                     [name]:          [id number]  DATE:02/21/2007                            DOB:          05/18/1946    Mr. Cloe is a very pleasant 65 year old mildly obese white married  male with prior myocardial infarction in 1996, treated with lytic  therapy and status post PTCA in [place], [place].  The lesion was in  the circumflex. the RCA and LAD revealed no significant disease.   The patient has gotten along quite well since that time.  Catheterization in January 2002 revealed 60% and 50% lesion to RCA but  no significant left coronary disease and normal LV.   His EF was 65%.   He has had hyperlipidemia,and is  on therapy.   The patient has had no recent chest pain, shortness of breath, or  palpitations.   MEDICATIONS:  1. Lisinopril 20.  2. Lipitor 40.  3. Aspirin 81.  4. Furosemide 20 b.i.d.  5. Methadone.  6. Klonopin 2.5 t.i.d.  7. Prilosec.  8. Flexeril.  9. Sertraline.  10.Toprol XL 25 daily.   PHYSICAL EXAMINATION:  Blood pressure 120/76, pulse 71, normal sinus  rhythm . JVPs elevated, carotid pulses are palpable and equal without  bruits.  LUNGS:  Clear.  CARDIO:  Reveals no murmur, gallop, or rub.  ABDOMINAL:  There is some fullness but no pulsation or masses, no bruit.  EXTREMITIES:  Reveal no edema.   IMPRESSION:  1. Coronary artery disease.  Status post circumflex myocardial      infarction treated with Lytic therapy and subsequent PTCA -      asymptomatic too.  2. Hyperlipidemia.  On therapy.  3. Chronic lower back pain.  4. Hypertension.  On therapy.   I have suggested the patient continue on same therapy although we had  planned to change from Lipitor 40 to simvastatin 80 because of cost.   We will plan a followup lipid and LFTs, and I think he should have an  abdominal ultrasound since the one done 5 years ago is  inadequate.   I should note the EKG reveals normal sinus rhythm, question of old  inferior MI is unchanged.   I have suggested he see Dr. Dietrich Pates, along with his wife in 4 or 5  months or p.r.n.     E. Graceann Congress, MD, [facility]  Electronically Signed    EJL/MedQ  DD: 02/21/2007  DT: 02/21/2007  Job #: [id number]

## 2011-05-26 ENCOUNTER — Telehealth: Payer: Self-pay | Admitting: Pulmonary Disease

## 2011-05-26 NOTE — Telephone Encounter (Signed)
Patient is in lobby with a friend having a PFT so he will be here for about an hour or so.Roswell Nickel

## 2011-05-26 NOTE — Telephone Encounter (Signed)
Called and spoke with pt.  Pt states he recently started on cpap and is having problems with it.  States he is having difficulty sleeping with machine- only gets approx 5 hours of sleep per night.  Pt states he has difficulty breathing while wearing cpap d/t nasal congestion and cpap is uncomfortable to wear (denies any sore spots on face from cpap mask) Pt states he has adjusted the humidifier on his cpap machine-it is currently set at 4 but states he still has the nasal congestion/difficulty breathing through nose.  Pt aware KC out of office this afternoon and will return tomorrow.  Pt ok to wait until tomorrow to have message addressed.

## 2011-05-27 NOTE — Telephone Encounter (Signed)
Called spoke with patient, advised of KC's recs.  Pt states that he does already have some saline nasal spray and will use this just before putting on his CPAP.  Pt also states that he has been using the ramp feature on his CPAP but was quite willing to give it a few more weeks with the recs given by [facility] to see if things improve.  Will sign off on msg.

## 2011-05-27 NOTE — Telephone Encounter (Signed)
Let him know that dry air can cause nasal congestion.  He might try turning the heat on humidifier up some more to see if it makes a difference.  He can also try nasal saline spray to clean out and moisturize his nose just before putting on cpap at night.   Unclear if sleep issue is related to mask, pressure, or cpap in general.  If mask is uncomfortable, may need to try different type.  If pressure is an issue, make sure he is using the ramp feature on machine and can set at 15, 30, or before it gets to highest pressure.  Lets give this a few weeks and see if things improve.  If not, can discuss further.

## 2011-06-09 ENCOUNTER — Other Ambulatory Visit: Payer: Self-pay | Admitting: *Deleted

## 2011-06-09 MED ORDER — POTASSIUM CHLORIDE CRYS ER 20 MEQ PO TBCR: 20.0000 meq | EXTENDED_RELEASE_TABLET | Freq: Every day | ORAL | Status: DC

## 2011-06-21 ENCOUNTER — Telehealth: Payer: Self-pay | Admitting: Pulmonary Disease

## 2011-06-21 NOTE — Telephone Encounter (Signed)
Error.Albert Wade ° °

## 2011-06-24 ENCOUNTER — Ambulatory Visit: Payer: Medicare Other | Admitting: Pulmonary Disease

## 2011-07-11 ENCOUNTER — Ambulatory Visit: Payer: Medicare Other | Admitting: Pulmonary Disease

## 2011-08-03 ENCOUNTER — Ambulatory Visit: Payer: Medicare Other | Admitting: Pulmonary Disease

## 2011-10-07 ENCOUNTER — Telehealth: Payer: Self-pay | Admitting: Internal Medicine

## 2011-10-07 NOTE — Telephone Encounter (Signed)
Pt wants refill of losartan called to [facility] [phone]

## 2011-10-10 MED ORDER — LOSARTAN POTASSIUM 50 MG PO TABS: 50.0000 mg | ORAL_TABLET | Freq: Every day | ORAL | Status: DC

## 2011-10-24 ENCOUNTER — Ambulatory Visit (INDEPENDENT_AMBULATORY_CARE_PROVIDER_SITE_OTHER): Payer: Medicare Other | Admitting: Internal Medicine

## 2011-10-24 ENCOUNTER — Encounter: Payer: Self-pay | Admitting: Internal Medicine

## 2011-10-24 VITALS — BP 123/73 | HR 72 | Ht 71.0 in | Wt 213.0 lb

## 2011-10-24 DIAGNOSIS — I2510 Atherosclerotic heart disease of native coronary artery without angina pectoris: Secondary | ICD-10-CM

## 2011-10-24 DIAGNOSIS — E785 Hyperlipidemia, unspecified: Secondary | ICD-10-CM

## 2011-10-24 NOTE — Patient Instructions (Signed)
Your physician wants you to follow-up in:MAY 2013 You will receive a reminder letter in the mail two months in advance. If you don't receive a letter, please call our office to schedule the follow-up appointment.

## 2011-10-24 NOTE — Assessment & Plan Note (Signed)
Excellent control on last check. NO change.

## 2011-10-24 NOTE — Assessment & Plan Note (Signed)
Mild by cath.  O symptoms of active angina.

## 2011-10-24 NOTE — Progress Notes (Signed)
HPIPatient is a 65 year old with a history of CAD (mild by cath in 10/2010), COPD, dyslipidemia and hyptertension. I saw him in clinic last October.  Since  I saw him he has done OK from a cardiac standpoint.  No chest pains.  Breathing is OK.  Biggest problem is his back pain.  No Known Allergies  Current Outpatient Prescriptions  Medication Sig Dispense Refill  . albuterol (PROAIR HFA) 108 (90 BASE) MCG/ACT inhaler Inhale 2 puffs into the lungs every 6 (six) hours as needed.        Marland Kitchen aspirin 81 MG tablet Take 81 mg by mouth daily.        . budesonide-formoterol (SYMBICORT) 160-4.5 MCG/ACT inhaler Inhale 2 puffs into the lungs 2 (two) times daily.        . cyclobenzaprine (FLEXERIL) 10 MG tablet Take 20 mg by mouth at bedtime.        . furosemide (LASIX) 40 MG tablet Take 1 tablet (40 mg total) by mouth as directed.  30 tablet  11  . gabapentin (NEURONTIN) 300 MG capsule       . losartan (COZAAR) 50 MG tablet Take 1 tablet (50 mg total) by mouth daily.  30 tablet  6  . methadone (DOLOPHINE) 10 MG tablet Take 10 mg by mouth every 6 (six) hours.        . metoprolol (LOPRESSOR) 50 MG tablet Take 50 mg by mouth daily.       Marland Kitchen omeprazole (PRILOSEC) 20 MG capsule Take 20 mg by mouth daily.        Marland Kitchen oxyCODONE-acetaminophen (PERCOCET) 10-325 MG per tablet Take 1 tablet by mouth every 4 (four) hours as needed.        . potassium chloride SA (K-DUR,KLOR-CON) 20 MEQ tablet Take 1 tablet (20 mEq total) by mouth daily.  30 tablet  6  . sertraline (ZOLOFT) 100 MG tablet Take 200 mg by mouth daily.        . simvastatin (ZOCOR) 80 MG tablet One half tab every day         Past Medical History  Diagnosis Date  . CAD (coronary artery disease)   . Hypercholesteremia   . GERD (gastroesophageal reflux disease)   . Pneumonia   . Anxiety   . Chronic back pain   . Chronic bronchitis   . MI (myocardial infarction)   . Depression   . Hiatal hernia   . BPH (benign prostatic hypertrophy)     Past Surgical  History  Procedure Date  . Back surgery     x3  . Appendectomy   . Angioplasty     Family History  Problem Relation Age of Onset  . Prostate cancer Father   . Emphysema Father     History   Social History  . Marital Status: Married    Spouse Name: N/A    Number of Children: N/A  . Years of Education: N/A   Occupational History  . retired Naval architect    Social History Main Topics  . Smoking status: Former Smoker -- 1.5 packs/day for 40 years    Quit date: 12/27/1995  . Smokeless tobacco: Not on file  . Alcohol Use: No  . Drug Use: No  . Sexually Active: Not on file   Other Topics Concern  . Not on file   Social History Narrative  . No narrative on file    Review of Systems:  All systems reviewed.  They are negative to the  above problem except as previously stated.  Vital Signs: BP 123/73  Pulse 72  Ht 5\' 11"  (1.803 m)  Wt 213 lb (96.616 kg)  BMI 29.71 kg/m2  Physical Exam Patient is in NAD.  HEENT:  Normocephalic, atraumatic. EOMI, PERRLA.  Neck: JVP is normal. No thyromegaly. No bruits.  Lungs: clear to auscultation. No rales no wheezes.  Heart: Regular rate and rhythm. Normal S1, S2. No S3.   No significant murmurs. PMI not displaced.  Abdomen:  Supple, nontender. Normal bowel sounds. No masses. No hepatomegaly.  Extremities:   Good distal pulses throughout. No lower extremity edema.  Musculoskeletal :moving all extremities.  Neuro:   alert and oriented x3.  CN II-XII grossly intact.  EKG:  Sinus rhtyhm.  72 bpm.  Possible IWMI.   Assessment and Plan:

## 2012-06-18 ENCOUNTER — Encounter: Payer: Self-pay | Admitting: Internal Medicine

## 2012-06-18 ENCOUNTER — Ambulatory Visit (INDEPENDENT_AMBULATORY_CARE_PROVIDER_SITE_OTHER): Payer: Medicare Other | Admitting: Internal Medicine

## 2012-06-18 VITALS — BP 130/72 | HR 68 | Ht 71.0 in | Wt 223.0 lb

## 2012-06-18 DIAGNOSIS — I2510 Atherosclerotic heart disease of native coronary artery without angina pectoris: Secondary | ICD-10-CM

## 2012-06-18 NOTE — Patient Instructions (Addendum)
Your physician wants you to follow-up in:  6 months. You will receive a reminder letter in the mail two months in advance. If you don't receive a letter, please call our office to schedule the follow-up appointment.   

## 2012-06-18 NOTE — Progress Notes (Signed)
HPI Patient is a 66 year old with a history of CAD (mild by cath in 10/2010), COPD, dyslipidemia and hyptertension. I saw him in clinic last October.  Since I saw him he has done OK from a cardiac standpoint. No chest pains. Recently breathing has been bad.  He admits to having problems when he mows his neighbors yard.  Had signif wheezing this weekend.    No Known Allergies  Current Outpatient Prescriptions  Medication Sig Dispense Refill  . albuterol (PROAIR HFA) 108 (90 BASE) MCG/ACT inhaler Inhale 2 puffs into the lungs every 6 (six) hours as needed.        Marland Kitchen aspirin 81 MG tablet Take 81 mg by mouth daily.        . budesonide-formoterol (SYMBICORT) 160-4.5 MCG/ACT inhaler Inhale 2 puffs into the lungs 2 (two) times daily.        . furosemide (LASIX) 40 MG tablet Take 1 tablet (40 mg total) by mouth as directed.  30 tablet  11  . gabapentin (NEURONTIN) 300 MG capsule       . losartan (COZAAR) 50 MG tablet Take 1 tablet (50 mg total) by mouth daily.  30 tablet  6  . methadone (DOLOPHINE) 10 MG tablet Take 10 mg by mouth every 6 (six) hours.        . metoprolol (LOPRESSOR) 50 MG tablet Take 50 mg by mouth daily.       Marland Kitchen omeprazole (PRILOSEC) 20 MG capsule Take 20 mg by mouth daily.        Marland Kitchen oxyCODONE-acetaminophen (PERCOCET) 10-325 MG per tablet Take 1 tablet by mouth every 4 (four) hours as needed.        . potassium chloride SA (K-DUR,KLOR-CON) 20 MEQ tablet Take 1 tablet (20 mEq total) by mouth daily.  30 tablet  6  . sertraline (ZOLOFT) 100 MG tablet Take 200 mg by mouth daily.        . simvastatin (ZOCOR) 80 MG tablet One half tab every day       . cyclobenzaprine (FLEXERIL) 10 MG tablet Take 20 mg by mouth at bedtime.          Past Medical History  Diagnosis Date  . CAD (coronary artery disease)   . Hypercholesteremia   . GERD (gastroesophageal reflux disease)   . Pneumonia   . Anxiety   . Chronic back pain   . Chronic bronchitis   . MI (myocardial infarction)   .  Depression   . Hiatal hernia   . BPH (benign prostatic hypertrophy)     Past Surgical History  Procedure Date  . Back surgery     x3  . Appendectomy   . Angioplasty     Family History  Problem Relation Age of Onset  . Prostate cancer Father   . Emphysema Father     History   Social History  . Marital Status: Married    Spouse Name: N/A    Number of Children: N/A  . Years of Education: N/A   Occupational History  . retired Naval architect    Social History Main Topics  . Smoking status: Former Smoker -- 1.5 packs/day for 40 years    Quit date: 12/27/1995  . Smokeless tobacco: Not on file  . Alcohol Use: No  . Drug Use: No  . Sexually Active: Not on file   Other Topics Concern  . Not on file   Social History Narrative  . No narrative on file  Review of Systems:  All systems reviewed.  They are negative to the above problem except as previously stated.  Vital Signs: BP 130/72  Pulse 68  Ht 5\' 11"  (1.803 m)  Wt 223 lb (101.152 kg)  BMI 31.10 kg/m2  Physical Exam Patient is in NAD HEENT:  Normocephalic, atraumatic. EOMI, PERRLA.  Neck: JVP is normal. No thyromegaly. No bruits.  Lungs: Mild wheeze on exam.  No rales.  MOving air.  Heart: Regular rate and rhythm. Normal S1, S2. No S3.   No significant murmurs. PMI not displaced.  Abdomen:  Supple, nontender. Normal bowel sounds. No masses. No hepatomegaly.  Extremities:   Good distal pulses throughout. Triv lower extremity edema. Varicose veins. Musculoskeletal :moving all extremities.  Neuro:   alert and oriented x3.  CN II-XII grossly intact.  EKG:  SR 68. Assessment and Plan:  1.  CAD.  No symptoms to sugg angina  2.  Reactive airway dz.  Patient with wheezing on exam.  It is improving by his reprot.  I have encouraged him to stop mowing the lawn for his neighbor as this has really triggered a flar.  If symptoms do not improve call back for increased Rx.  3.  HL  Lipids good  4.  HTN  Good  control.

## 2012-10-31 ENCOUNTER — Other Ambulatory Visit: Payer: Self-pay | Admitting: Internal Medicine

## 2012-11-01 NOTE — Telephone Encounter (Signed)
New Problem:   Patient's wife called in needing a refill of her husbands medications.

## 2012-11-12 ENCOUNTER — Ambulatory Visit: Payer: Medicare Other | Admitting: Internal Medicine

## 2012-11-24 ENCOUNTER — Other Ambulatory Visit: Payer: Self-pay | Admitting: Internal Medicine

## 2012-11-26 ENCOUNTER — Other Ambulatory Visit: Payer: Self-pay | Admitting: Internal Medicine

## 2012-11-26 MED ORDER — SIMVASTATIN 80 MG PO TABS: 80.0000 mg | ORAL_TABLET | Freq: Every day | ORAL | Status: DC

## 2013-01-21 ENCOUNTER — Ambulatory Visit (INDEPENDENT_AMBULATORY_CARE_PROVIDER_SITE_OTHER): Payer: Medicare Other | Admitting: Internal Medicine

## 2013-01-21 ENCOUNTER — Encounter: Payer: Self-pay | Admitting: Internal Medicine

## 2013-01-21 VITALS — BP 140/70 | HR 66 | Ht 71.0 in | Wt 224.0 lb

## 2013-01-21 DIAGNOSIS — E785 Hyperlipidemia, unspecified: Secondary | ICD-10-CM

## 2013-01-21 DIAGNOSIS — I25810 Atherosclerosis of coronary artery bypass graft(s) without angina pectoris: Secondary | ICD-10-CM

## 2013-01-21 LAB — CBC WITH DIFFERENTIAL/PLATELET
Basophils Absolute: 0 10*3/uL (ref 0.0–0.1)
Basophils Relative: 0.4 % (ref 0.0–3.0)
Eosinophils Absolute: 0.3 10*3/uL (ref 0.0–0.7)
Eosinophils Relative: 2.9 % (ref 0.0–5.0)
HCT: 39.3 % (ref 39.0–52.0)
Hemoglobin: 13.2 g/dL (ref 13.0–17.0)
Lymphocytes Relative: 25.1 % (ref 12.0–46.0)
Lymphs Abs: 2.3 10*3/uL (ref 0.7–4.0)
MCHC: 33.7 g/dL (ref 30.0–36.0)
MCV: 91.2 fl (ref 78.0–100.0)
Monocytes Absolute: 0.6 10*3/uL (ref 0.1–1.0)
Monocytes Relative: 6.2 % (ref 3.0–12.0)
Neutro Abs: 5.9 10*3/uL (ref 1.4–7.7)
Neutrophils Relative %: 65.4 % (ref 43.0–77.0)
Platelets: 238 10*3/uL (ref 150.0–400.0)
RBC: 4.31 Mil/uL (ref 4.22–5.81)
RDW: 13.6 % (ref 11.5–14.6)
WBC: 9 10*3/uL (ref 4.5–10.5)

## 2013-01-21 LAB — LIPID PANEL
Cholesterol: 128 mg/dL (ref 0–200)
HDL: 36.7 mg/dL — ABNORMAL LOW
LDL Cholesterol: 72 mg/dL (ref 0–99)
Total CHOL/HDL Ratio: 3
Triglycerides: 96 mg/dL (ref 0.0–149.0)
VLDL: 19.2 mg/dL (ref 0.0–40.0)

## 2013-01-21 LAB — BASIC METABOLIC PANEL WITH GFR
BUN: 12 mg/dL (ref 6–23)
CO2: 27 meq/L (ref 19–32)
Calcium: 8.9 mg/dL (ref 8.4–10.5)
Chloride: 107 meq/L (ref 96–112)
Creatinine, Ser: 0.9 mg/dL (ref 0.4–1.5)
GFR: 89.48 mL/min
Glucose, Bld: 113 mg/dL — ABNORMAL HIGH (ref 70–99)
Potassium: 4 meq/L (ref 3.5–5.1)
Sodium: 141 meq/L (ref 135–145)

## 2013-01-21 LAB — AST: AST: 28 U/L (ref 0–37)

## 2013-01-21 NOTE — Patient Instructions (Addendum)
Your physician wants you to follow-up in: 6 months with Dr. Tenny Craw.  You will receive a reminder letter in the mail two months in advance. If you don't receive a letter, please call our office to schedule the follow-up appointment.  LABS TODAY:  CBC, BMET, LIPID PANEL, AST  You have an appointment to see Dr. Marcelyn Bruins tomorrow, 01/22/2013, at 11:15am

## 2013-01-21 NOTE — Progress Notes (Signed)
HPI Patient is a 67 year old with a history of CAD (mild by cath in 10/2010), COPD, dyslipidemia and hyptertension. I saw him in clinic in June 2013. When I 've seen him in the past he had problems with SOB and wheezing after exposure to grass (mowing)  I recomm steroid inhaler which he has used in past and it has helped.  He says now his inhaler is not working .  Has been more SOB.  Denies exposure as has been trigger in past. Notes cough has been productive of greenish sputum that is chronic.  Denies fever.  No Known Allergies  Current Outpatient Prescriptions  Medication Sig Dispense Refill  . albuterol (PROAIR HFA) 108 (90 BASE) MCG/ACT inhaler Inhale 2 puffs into the lungs every 6 (six) hours as needed.        Marland Kitchen aspirin 81 MG tablet Take 81 mg by mouth daily.        . budesonide-formoterol (SYMBICORT) 160-4.5 MCG/ACT inhaler Inhale 2 puffs into the lungs 2 (two) times daily.        . cyclobenzaprine (FLEXERIL) 10 MG tablet Take 20 mg by mouth at bedtime.        . furosemide (LASIX) 40 MG tablet TAKE ONE TABLET BY MOUTH AS             DIRECTED                                                  Generic for LASIX 20  30 tablet  5  . gabapentin (NEURONTIN) 300 MG capsule       . KLOR-CON M20 20 MEQ tablet TAKE 1 TABLET BY MOUTH EVERY DAY.  30 tablet  5  . losartan (COZAAR) 50 MG tablet TAKE ONE (1) TABLET EACH DAY                                                  GENERIC FOR COZAAR  30 tablet  5  . methadone (DOLOPHINE) 10 MG tablet Take 10 mg by mouth every 6 (six) hours.        . metoprolol (LOPRESSOR) 50 MG tablet Take 50 mg by mouth daily.       . metoprolol succinate (TOPROL-XL) 50 MG 24 hr tablet Take 1 tablet (50 mg total) by mouth daily.  30 tablet  5  . omeprazole (PRILOSEC) 20 MG capsule Take 20 mg by mouth daily.        Marland Kitchen oxyCODONE-acetaminophen (PERCOCET) 10-325 MG per tablet Take 1 tablet by mouth every 4 (four) hours as needed.        . sertraline (ZOLOFT) 100 MG tablet Take 200 mg  by mouth daily.        . simvastatin (ZOCOR) 80 MG tablet Take 1 tablet (80 mg total) by mouth at bedtime.  30 tablet  5    Past Medical History  Diagnosis Date  . CAD (coronary artery disease)   . Hypercholesteremia   . GERD (gastroesophageal reflux disease)   . Pneumonia   . Anxiety   . Chronic back pain   . Chronic bronchitis   . MI (myocardial infarction)   . Depression   . Hiatal hernia   .  BPH (benign prostatic hypertrophy)     Past Surgical History  Procedure Date  . Back surgery     x3  . Appendectomy   . Angioplasty     Family History  Problem Relation Age of Onset  . Prostate cancer Father   . Emphysema Father     History   Social History  . Marital Status: Married    Spouse Name: N/A    Number of Children: N/A  . Years of Education: N/A   Occupational History  . retired Naval architect    Social History Main Topics  . Smoking status: Former Smoker -- 1.5 packs/day for 40 years    Quit date: 12/27/1995  . Smokeless tobacco: Not on file  . Alcohol Use: No  . Drug Use: No  . Sexually Active: Not on file   Other Topics Concern  . Not on file   Social History Narrative  . No narrative on file    Review of Systems:  All systems reviewed.  They are negative to the above problem except as previously stated.  Vital Signs: There were no vitals taken for this visit.  Physical Exam Patient is in NAD at rest. HEENT:  Normocephalic, atraumatic. EOMI, PERRLA.  Neck: JVP is normal.  No bruits.  Lungs: Decreased airflow  Some pops, rhonchi. Heart: Regular rate and rhythm. Normal S1, S2. No S3.   No significant murmurs. PMI not displaced.  Abdomen:  Supple, nontender. Normal bowel sounds. No masses. No hepatomegaly.  Extremities:   Good distal pulses throughout. No lower extremity edema.  Musculoskeletal :moving all extremities.  Neuro:   alert and oriented x3.  CN II-XII grossly intact.  EKG  NSR 66 bpm  Assessment and Plan:  1.  Pulmonary.   Patient appears to have exacerbation of chronic bronchitis.  I am not sure why inhaler not working. He is comfortabale at rest.  I have set him up to be seen in pulmonary 1/28 with K Clance (who he has seen for sleep apnea)  No changes  2.  CAD  No symptoms of angina.  3.  HL  Will check lipids

## 2013-01-22 ENCOUNTER — Institutional Professional Consult (permissible substitution): Payer: Medicare Other | Admitting: Pulmonary Disease

## 2013-01-24 ENCOUNTER — Telehealth: Payer: Self-pay | Admitting: Internal Medicine

## 2013-01-24 NOTE — Telephone Encounter (Signed)
LMTCB

## 2013-01-24 NOTE — Telephone Encounter (Signed)
New problem:  Test results.  

## 2013-02-06 ENCOUNTER — Institutional Professional Consult (permissible substitution): Payer: Medicare Other | Admitting: Pulmonary Disease

## 2013-02-22 ENCOUNTER — Institutional Professional Consult (permissible substitution): Payer: Medicare Other | Admitting: Pulmonary Disease

## 2013-05-17 ENCOUNTER — Other Ambulatory Visit: Payer: Self-pay | Admitting: Internal Medicine

## 2013-08-20 ENCOUNTER — Telehealth: Payer: Self-pay | Admitting: Internal Medicine

## 2013-08-20 NOTE — Telephone Encounter (Signed)
Pts wife states the pt had a BM yesterday and there was bright red blood in the stool. Requesting pt be seen sooner than 1st available. Pt scheduled to see Willette Cluster NP 08/21/13@1 :30pm. Pt aware of appt.

## 2013-08-21 ENCOUNTER — Ambulatory Visit: Payer: Medicare Other | Admitting: Nurse Practitioner

## 2013-09-13 ENCOUNTER — Other Ambulatory Visit: Payer: Self-pay

## 2013-09-13 MED ORDER — METOPROLOL TARTRATE 50 MG PO TABS: 50.0000 mg | ORAL_TABLET | Freq: Every day | ORAL | Status: DC

## 2013-09-27 ENCOUNTER — Other Ambulatory Visit: Payer: Self-pay | Admitting: Urology

## 2013-09-30 ENCOUNTER — Ambulatory Visit: Payer: Medicare Other | Admitting: Internal Medicine

## 2013-10-07 ENCOUNTER — Ambulatory Visit (INDEPENDENT_AMBULATORY_CARE_PROVIDER_SITE_OTHER): Payer: Medicare Other | Admitting: Nurse Practitioner

## 2013-10-07 ENCOUNTER — Encounter: Payer: Self-pay | Admitting: Nurse Practitioner

## 2013-10-07 VITALS — BP 154/78 | HR 56 | Ht 71.0 in | Wt 212.0 lb

## 2013-10-07 DIAGNOSIS — I2510 Atherosclerotic heart disease of native coronary artery without angina pectoris: Secondary | ICD-10-CM

## 2013-10-07 DIAGNOSIS — Z01818 Encounter for other preprocedural examination: Secondary | ICD-10-CM

## 2013-10-07 LAB — HEPATIC FUNCTION PANEL
ALT: 17 U/L (ref 0–53)
AST: 20 U/L (ref 0–37)
Albumin: 3.6 g/dL (ref 3.5–5.2)
Alkaline Phosphatase: 79 U/L (ref 39–117)
Bilirubin, Direct: 0 mg/dL (ref 0.0–0.3)
Total Bilirubin: 0.3 mg/dL (ref 0.3–1.2)
Total Protein: 7.1 g/dL (ref 6.0–8.3)

## 2013-10-07 LAB — BASIC METABOLIC PANEL WITH GFR
BUN: 9 mg/dL (ref 6–23)
CO2: 29 meq/L (ref 19–32)
Calcium: 8.9 mg/dL (ref 8.4–10.5)
Chloride: 107 meq/L (ref 96–112)
Creatinine, Ser: 0.8 mg/dL (ref 0.4–1.5)
GFR: 99.42 mL/min
Glucose, Bld: 108 mg/dL — ABNORMAL HIGH (ref 70–99)
Potassium: 3.9 meq/L (ref 3.5–5.1)
Sodium: 144 meq/L (ref 135–145)

## 2013-10-07 LAB — LIPID PANEL
Cholesterol: 147 mg/dL (ref 0–200)
HDL: 36.9 mg/dL — ABNORMAL LOW
LDL Cholesterol: 72 mg/dL (ref 0–99)
Total CHOL/HDL Ratio: 4
Triglycerides: 193 mg/dL — ABNORMAL HIGH (ref 0.0–149.0)
VLDL: 38.6 mg/dL (ref 0.0–40.0)

## 2013-10-07 NOTE — Patient Instructions (Addendum)
Try to get a BP cuff (Omron) and monitor your BP at home  I think you are a reasonable candidate for your surgery  Stop your aspirin for 5 days prior to your surgery  See Dr. Tenny Craw in 8 months  We will check labs today  Call the [facility] office at [phone] if you have any questions, problems or concerns.

## 2013-10-07 NOTE — Progress Notes (Signed)
Albert Wade Date of Birth: [birth year] Medical Record #[id number]  History of Present Illness: Albert Wade is seen back today for a pre op visit. Seen for Dr. Tenny Craw. Has a history of CAD with remote inferior MI treated with lytic therapy (mild disease by cath in 2011), COPD, HLD and HTN.   Last seen here in January - was short of breath - was referred to pulmonary.   Comes back today. Here with his wife. Doing ok. 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 Needs revision. Clearance was asked for in regards to his aspirin as well. No chest pain. Not short of breath. Continues to do his own yard work. No real exercise due to his back issues. No syncope. Does not have a cuff to check his BP at home. Actively losing weight.    Current Outpatient Prescriptions  Medication Sig Dispense Refill  . aspirin 81 MG tablet Take 81 mg by mouth daily.        . cyclobenzaprine (FLEXERIL) 10 MG tablet Take 20 mg by mouth at bedtime.        . furosemide (LASIX) 40 MG tablet TAKE ONE TABLET BY MOUTH AS             DIRECTED                                                  Generic for LASIX 20  30 tablet  5  . KLOR-CON M20 20 MEQ tablet TAKE 1 TABLET BY MOUTH EVERY DAY.  30 tablet  5  . losartan (COZAAR) 50 MG tablet TAKE ONE (1) TABLET EACH DAY  90 tablet  1  . methadone (DOLOPHINE) 10 MG tablet Take 10 mg by mouth every 6 (six) hours.        . metoprolol (LOPRESSOR) 50 MG tablet Take 1 tablet (50 mg total) by mouth daily.  30 tablet  6  . omeprazole (PRILOSEC) 20 MG capsule Take 20 mg by mouth daily.        Marland Kitchen oxyCODONE-acetaminophen (PERCOCET) 10-325 MG per tablet Take 1 tablet by mouth every 4 (four) hours as needed.        . sertraline (ZOLOFT) 100 MG tablet Take 200 mg by mouth daily.        . simvastatin (ZOCOR) 80 MG tablet Take 1 tablet (80 mg total) by mouth at bedtime.  30 tablet  5   No current facility-administered medications for this visit.     No Known Allergies  Past Medical History  Diagnosis Date  . CAD (coronary artery disease)   . Hypercholesteremia   . GERD (gastroesophageal reflux disease)   . Pneumonia   . Anxiety   . Chronic back pain   . Chronic bronchitis   . MI (myocardial infarction)   . Depression   . Hiatal hernia   . BPH (benign prostatic hypertrophy)     Past Surgical History  Procedure Laterality Date  . Back surgery      x3  . Appendectomy    . Angioplasty      History  Smoking status  . Former Smoker -- 1.50 packs/day for 40 years  . Quit date: 12/27/1995  Smokeless tobacco  . Not on file    History  Alcohol Use No  Family History  Problem Relation Age of Onset  . Prostate cancer Father   . Emphysema Father     Review of Systems: The review of systems is per the HPI.  All other systems were reviewed and are negative.  Physical Exam: BP 154/78  Pulse 56  Ht 5\' 11"  (1.803 m)  Wt 212 lb (96.163 kg)  BMI 29.58 kg/m2 BP by me is 140/90.  Patient is very pleasant and in no acute distress. Weight is down 12 pounds since January. Skin is warm and dry. Color is normal.  HEENT is unremarkable. Normocephalic/atraumatic. PERRL. Sclera are nonicteric. Neck is supple. No masses. No JVD. Lungs are clear. Cardiac exam shows a regular rate and rhythm. Abdomen is soft. Extremities are without edema. Gait and ROM are intact. No gross neurologic deficits noted.  LABORATORY DATA:  EKG today shows sinus brady - no acute changes - tracing unchanged.   Lab Results  Component Value Date   WBC 9.0 01/21/2013   HGB 13.2 01/21/2013   HCT 39.3 01/21/2013   PLT 238.0 01/21/2013   GLUCOSE 113* 01/21/2013   CHOL 128 01/21/2013   TRIG 96.0 01/21/2013   HDL 36.70* 01/21/2013   LDLDIRECT 76.6 03/21/2007   LDLCALC 72 01/21/2013   ALT 17 07/30/2008   AST 28 01/21/2013   NA 141 01/21/2013   K 4.0 01/21/2013   CL 107 01/21/2013   CREATININE 0.9 01/21/2013   BUN 12 01/21/2013   CO2 27 01/21/2013   TSH 4.97  10/08/2010    Assessment / Plan: 1. CAD - remote inferior MI treated with lytic therapy - s/p cardiac cath in November of 2011 showing just mild nonobstructive CAD and normal LV function - no chest pain or other cardiac symptoms reported.   2. Pre op clearance - I think he is an acceptable candidate for his GU procedure. Ok to hold aspirin for 5 days prior.   3. HTN - Bp up some - he is willing to get a cuff at home and monitor.   4. HLD - recheck his labs today  Tentatively see him back in 8 months (this gets him back on track with wife's appointment). Check labs today.   Patient is agreeable to this plan and will call if any problems develop in the interim.   Rosalio Macadamia, RN, ANP-C [facility] [address] [place], [place]  [address]

## 2013-10-10 ENCOUNTER — Encounter (HOSPITAL_COMMUNITY): Payer: Self-pay | Admitting: Pharmacy Technician

## 2013-10-14 ENCOUNTER — Ambulatory Visit (INDEPENDENT_AMBULATORY_CARE_PROVIDER_SITE_OTHER): Payer: Medicare Other

## 2013-10-14 ENCOUNTER — Ambulatory Visit (INDEPENDENT_AMBULATORY_CARE_PROVIDER_SITE_OTHER): Payer: Medicare Other | Admitting: Podiatry

## 2013-10-14 ENCOUNTER — Encounter: Payer: Self-pay | Admitting: Podiatry

## 2013-10-14 VITALS — BP 162/86 | HR 66 | Ht 70.0 in | Wt 209.0 lb

## 2013-10-14 DIAGNOSIS — M86179 Other acute osteomyelitis, unspecified ankle and foot: Secondary | ICD-10-CM

## 2013-10-14 DIAGNOSIS — R52 Pain, unspecified: Secondary | ICD-10-CM

## 2013-10-14 MED ORDER — LEVOFLOXACIN 750 MG PO TABS: 750.0000 mg | ORAL_TABLET | Freq: Every day | ORAL | Status: DC

## 2013-10-14 NOTE — Progress Notes (Signed)
Albert Wade presents today stating his toe, fourth left, has re\re ulcerated. States that he was using pus with malodor over the last couple of days he states recently he is doing much better. He denies fever chills nausea vomiting muscle aches and pains. He's concerned about this infection because he has an upcoming circumcision.  Objective: Vital signs are stable he is alert and oriented x3. I have reviewed his past medical history medications and allergies. Currently the fourth digit of his left foot is still of normal size there is a very small lateral ulceration overlying the PIPJ. Radiographic evaluation of the fourth toe does concern me for early osteomyelitis overlying the middle phalanx laterally. Skin does not demonstrate any signs of infection at this point.  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  Plan: Start him back on Levaquin 750 one by mouth daily. He will continue soaking daily Epsom salts and water and dressing the toe daily. We did discuss the possible need for surgical correction.

## 2013-10-15 NOTE — H&P (Signed)
History of Present Illness   [name] presents today primarily for some issues with regard to redundant foreskin with phimosis. Albert Wade is currently 67 years of age. He tells me for a number of years now he has had difficulty retracting his foreskin. He did have a neonatal circumcision but always felt there was extra skin. Recently he has been completely unable to retract that skin and has a ____ opening. He is unable to control his urinary stream and needs to void sitting down. He has also had some issues with incomplete bladder emptying, frequency, urgency, and some urge incontinence. The patient's overall AUA symptom score is 22 with a bothersome score of 4. He has not had any definitive episodes of balanitis. At this point he has had some fairly longstanding erectile dysfunction and gets very minimal erections.      Past Medical History Problems  1. History of  Acute Myocardial Infarction V12.59 2. History of  Anxiety (Symptom) 300.00 3. History of  Depression 311 4. History of  Esophageal Reflux 530.81 5. History of  Gastric Ulcer 531.90 6. History of  Heart Disease 429.9 7. History of  Hypercholesterolemia 272.0  Surgical History Problems  1. History of  Back Surgery  Current Meds 1. Aspirin 81 MG Oral Tablet; Therapy: (Recorded:02Oct2014) to 2. Cyclobenzaprine HCl 10 MG Oral Tablet; Therapy: 25Oct2012 to 3. Furosemide 40 MG Oral Tablet; TAKE 1 TABLET Twice daily; Therapy: 29Oct2012 to  (Evaluate:16Feb2013) 4. Losartan Potassium 50 MG Oral Tablet; Therapy: 29Oct2012 to 5. Methadone HCl 10 MG Oral Tablet; Therapy: 02Jan2013 to 6. Metoprolol Succinate ER 50 MG Oral Tablet Extended Release 24 Hour; Therapy: 29Oct2012 to 7. Oxycodone-Acetaminophen 10-325 MG Oral Tablet; Therapy: 02Jan2013 to 8. ProAir HFA 108 (90 Base) MCG/ACT Inhalation Aerosol Solution; Therapy: (Recorded:02Oct2014)  to 9. Sertraline HCl 100 MG Oral Tablet; Therapy: 14Dec2012 to 10. Simvastatin 80 MG Oral  Tablet; Therapy: 29Oct2012 to 11. Symbicort 160-4.5 MCG/ACT Inhalation Aerosol; Therapy: (Recorded:02Oct2014) to  Allergies Medication  1. No Known Drug Allergies  Family History Problems  1. Family history of  Death In The Family Father 2. Family history of  Death In The Family Mother 3. Family history of  Family Health Status Children ___ Living Daughters 1 4. Family history of  Family Health Status Children ___ Living Sons 2 5. Paternal history of  Prostate Cancer V16.42  Social History Problems    Former Smoker V15.82 smoked 1 ppd for 46 years; quit 20 years ago   Marital History - Currently Married   Physical Disability Affecting Ability To Work Denied    History of  Alcohol Use   History of  Caffeine Use  Review of Systems  Genitourinary: urinary frequency, feelings of urinary urgency, nocturia, incontinence, difficulty starting the urinary stream, erectile dysfunction and penile pain, but no dysuria and urine stream is not weak.  Gastrointestinal: abdominal pain, heartburn and constipation.  Constitutional: feeling tired (fatigue).  Hematologic/Lymphatic: a tendency to easily bruise.  Cardiovascular: chest pain and leg swelling.  Respiratory: shortness of breath.  Endocrine: polydipsia.  Musculoskeletal: back pain and joint pain.  Neurological: dizziness.  Psychiatric: depression.    Vitals Vital Signs [Data Includes: Last 1 Day]  02Oct2014 01:12PM  BMI Calculated: 28.98 BSA Calculated: 2.14 Height: 5 ft 11 in Weight: 207 lb  Blood Pressure: 167 / 74 Temperature: 97.4 F Heart Rate: 64  Physical Exam Constitutional: Well nourished and well developed . No acute distress.  ENT:. The ears and nose are normal in appearance.  Neck:  The appearance of the neck is normal and no neck mass is present.  Pulmonary: No respiratory distress and normal respiratory rhythm and effort.  Cardiovascular: Heart rate and rhythm are normal . No peripheral edema.  Abdomen:  The abdomen is obese. The abdomen is soft and nontender. Incisional hernia is present.  Genitourinary: Examination of the penis demonstrates phimosis, but no discharge, no masses, no lesions and a normal meatus. The penis is uncircumcised. The scrotum is without lesions. The right epididymis is palpably normal and non-tender. The left epididymis is palpably normal and non-tender. The right testis is non-tender and without masses. The left testis is non-tender and without masses.  Neuro/Psych:. Mood and affect are appropriate.    Results/Data Urine [Data Includes: Last 1 Day]   02Oct2014  COLOR YELLOW   APPEARANCE CLEAR   SPECIFIC GRAVITY 1.025   pH 6.5   GLUCOSE NEG mg/dL  BILIRUBIN NEG   KETONE NEG mg/dL  BLOOD NEG   PROTEIN NEG mg/dL  UROBILINOGEN 1 mg/dL  NITRITE NEG   LEUKOCYTE ESTERASE SMALL   SQUAMOUS EPITHELIAL/HPF RARE   WBC 0-2 WBC/hpf  RBC NONE SEEN RBC/hpf  BACTERIA RARE   CRYSTALS NONE SEEN   CASTS NONE SEEN   Other MUCUS NOTED    Assessment Assessed  1. Urinary Frequency 788.41 2. 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 Phimosis 605  Plan Health Maintenance (V70.0)  1. UA With REFLEX  Done: 02Oct2014 01:05PM Urinary Frequency (788.41)  2. Follow-up Schedule Surgery Office  Follow-up  Done: 02Oct2014  Discussion/Summary   [name] clearly has significant phimosis. I suspect he had an incomplete circumcision as a neonate and now has really inability to retract his foreskin. He really has a pinhole opening and does need a revision of his circumcision. Given his medical history, it would certainly be safest to do this with IV sedation and a penile block. He will need to discontinue his aspirin, so we should get cardiac permission for that. We did talk about the procedure, typical recovery. He has voiding symptoms and may require additional assessment and treatment but I would like to start with the circumcision and see how much that helps him before embarking on any  other evaluation or treatment.   cc: Dietrich Pates, MD      Signatures Electronically signed by : Barron Alvine, M.D.; Sep 27 2013  8:25AM

## 2013-10-16 ENCOUNTER — Encounter (HOSPITAL_COMMUNITY): Payer: Self-pay

## 2013-10-16 ENCOUNTER — Ambulatory Visit (HOSPITAL_COMMUNITY)
Admission: RE | Admit: 2013-10-16 | Discharge: 2013-10-16 | Disposition: A | Discharge status: Home / Self Care | Payer: Medicare Other | Source: Ambulatory Visit | Attending: Urology | Admitting: Urology

## 2013-10-16 ENCOUNTER — Encounter (HOSPITAL_COMMUNITY)
Admission: RE | Admit: 2013-10-16 | Discharge: 2013-10-16 | Disposition: A | Discharge status: Home / Self Care | Payer: Medicare Other | Source: Ambulatory Visit | Attending: Urology | Admitting: Urology

## 2013-10-16 DIAGNOSIS — Z01818 Encounter for other preprocedural examination: Secondary | ICD-10-CM | POA: Insufficient documentation

## 2013-10-16 DIAGNOSIS — Z01812 Encounter for preprocedural laboratory examination: Secondary | ICD-10-CM | POA: Insufficient documentation

## 2013-10-16 HISTORY — DX: Asymptomatic varicose veins of unspecified lower extremity: I83.90

## 2013-10-16 HISTORY — DX: Sleep apnea, unspecified: G47.30

## 2013-10-16 HISTORY — DX: Personal history of other diseases of the digestive system: Z87.19

## 2013-10-16 HISTORY — DX: Unspecified osteoarthritis, unspecified site: M19.90

## 2013-10-16 HISTORY — DX: Umbilical hernia without obstruction or gangrene: K42.9

## 2013-10-16 LAB — BASIC METABOLIC PANEL WITH GFR
BUN: 9 mg/dL (ref 6–23)
CO2: 27 meq/L (ref 19–32)
Calcium: 9.3 mg/dL (ref 8.4–10.5)
Chloride: 104 meq/L (ref 96–112)
Creatinine, Ser: 0.85 mg/dL (ref 0.50–1.35)
GFR calc Af Amer: >90 mL/min
GFR calc non Af Amer: 88 mL/min — ABNORMAL LOW
Glucose, Bld: 131 mg/dL — ABNORMAL HIGH (ref 70–99)
Potassium: 4 meq/L (ref 3.5–5.1)
Sodium: 139 meq/L (ref 135–145)

## 2013-10-16 LAB — CBC
HCT: 39.5 % (ref 39.0–52.0)
Hemoglobin: 13.2 g/dL (ref 13.0–17.0)
MCH: 30.1 pg (ref 26.0–34.0)
MCHC: 33.4 g/dL (ref 30.0–36.0)
MCV: 90 fL (ref 78.0–100.0)
Platelets: 224 K/uL (ref 150–400)
RBC: 4.39 MIL/uL (ref 4.22–5.81)
RDW: 12.8 % (ref 11.5–15.5)
WBC: 8.6 K/uL (ref 4.0–10.5)

## 2013-10-16 NOTE — Patient Instructions (Addendum)
[name]  10/16/2013   Your procedure is scheduled on:   10-21-2013  Report to [facility] at       0530 AM   Call this number if you have problems the morning of surgery: [phone]  Or Presurgical Testing [phone]([facility])       Do not eat food:After Midnight.    Take these medicines the morning of surgery with A SIP OF WATER: Levaquin. Methadone. Metoprolol. Oxycodone. Sertraline. Topiramate. Bring Advair/ Symbicort- inhalers-use if needed.   Do not wear jewelry, make-up or nail polish.  Do not wear lotions, powders, or perfumes. You may wear deodorant.  Do not shave 12 hours prior to first CHG shower(legs and under arms).(face and neck okay.)  Do not bring valuables to the hospital.  Contacts, dentures or bridgework,body piercing,  may not be worn into surgery.  Leave suitcase in the car. After surgery it may be brought to your room.  For patients admitted to the hospital, checkout time is 11:00 AM the day of discharge.   Patients discharged the day of surgery will not be allowed to drive home. Must have responsible person with you x 24 hours once discharged.  Name and phone number of your driver: [facility] spouse [phone] cell  Special Instructions: CHG(Chlorhedine 4%-"Hibiclens","Betasept","Aplicare") Shower Use Special Wash: see special instructions.(avoid face and genitals)      Failure to follow these instructions may result in Cancellation of your surgery.   Patient signature_______________________________________________________

## 2013-10-16 NOTE — Pre-Procedure Instructions (Addendum)
10-16-13 EKG 1'14 -Epic CXR done today

## 2013-10-18 ENCOUNTER — Telehealth: Payer: Self-pay | Admitting: Internal Medicine

## 2013-10-18 NOTE — Telephone Encounter (Signed)
New problem    Calling to check status of cardiac clearance for pt having sx 10/21/13. Please advise.

## 2013-10-18 NOTE — Telephone Encounter (Signed)
Spoke with [facility], will fax office note to [phone].

## 2013-10-20 ENCOUNTER — Encounter (HOSPITAL_COMMUNITY): Payer: Self-pay | Admitting: Anesthesiology

## 2013-10-20 NOTE — H&P (Signed)
History of Present Illness   [name] presents today primarily for some issues with regard to redundant foreskin with phimosis. Albert Wade is currently 67 years of age. He tells me for a number of years now he has had difficulty retracting his foreskin. He did have a neonatal circumcision but always felt there was extra skin. Recently he has been completely unable to retract that skin and has a ____ opening. He is unable to control his urinary stream and needs to void sitting down. He has also had some issues with incomplete bladder emptying, frequency, urgency, and some urge incontinence. The patient's overall AUA symptom score is 22 with a bothersome score of 4. He has not had any definitive episodes of balanitis. At this point he has had some fairly longstanding erectile dysfunction and gets very minimal erections.      Past Medical History Problems  1. History of  Acute Myocardial Infarction V12.59 2. History of  Anxiety (Symptom) 300.00 3. History of  Depression 311 4. History of  Esophageal Reflux 530.81 5. History of  Gastric Ulcer 531.90 6. History of  Heart Disease 429.9 7. History of  Hypercholesterolemia 272.0  Surgical History Problems  1. History of  Back Surgery  Current Meds 1. Aspirin 81 MG Oral Tablet; Therapy: (Recorded:02Oct2014) to 2. Cyclobenzaprine HCl 10 MG Oral Tablet; Therapy: 25Oct2012 to 3. Furosemide 40 MG Oral Tablet; TAKE 1 TABLET Twice daily; Therapy: 29Oct2012 to  (Evaluate:16Feb2013) 4. Losartan Potassium 50 MG Oral Tablet; Therapy: 29Oct2012 to 5. Methadone HCl 10 MG Oral Tablet; Therapy: 02Jan2013 to 6. Metoprolol Succinate ER 50 MG Oral Tablet Extended Release 24 Hour; Therapy:  29Oct2012 to 7. Oxycodone-Acetaminophen 10-325 MG Oral Tablet; Therapy: 02Jan2013 to 8. ProAir HFA 108 (90 Base) MCG/ACT Inhalation Aerosol Solution; Therapy:  (Recorded:02Oct2014) to 9. Sertraline HCl 100 MG Oral Tablet; Therapy: 14Dec2012 to 10. Simvastatin 80 MG Oral  Tablet; Therapy: 29Oct2012 to 11. Symbicort 160-4.5 MCG/ACT Inhalation Aerosol; Therapy: (Recorded:02Oct2014) to  Allergies Medication  1. No Known Drug Allergies  Family History Problems  1. Family history of  Death In The Family Father 2. Family history of  Death In The Family Mother 3. Family history of  Family Health Status Children ___ Living Daughters 1 4. Family history of  Family Health Status Children ___ Living Sons 2 5. Paternal history of  Prostate Cancer V16.42  Social History Problems    Former Smoker V15.82 smoked 1 ppd for 46 years; quit 20 years ago   Marital History - Currently Married   Physical Disability Affecting Ability To Work Denied    History of  Alcohol Use   History of  Caffeine Use  Review of Systems  Genitourinary: urinary frequency, feelings of urinary urgency, nocturia, incontinence, difficulty starting the urinary stream, erectile dysfunction and penile pain, but no dysuria and urine stream is not weak.  Gastrointestinal: abdominal pain, heartburn and constipation.  Constitutional: feeling tired (fatigue).  Hematologic/Lymphatic: a tendency to easily bruise.  Cardiovascular: chest pain and leg swelling.  Respiratory: shortness of breath.  Endocrine: polydipsia.  Musculoskeletal: back pain and joint pain.  Neurological: dizziness.  Psychiatric: depression.    Vitals Vital Signs [Data Includes: Last 1 Day]  02Oct2014 01:12PM  BMI Calculated: 28.98 BSA Calculated: 2.14 Height: 5 ft 11 in Weight: 207 lb  Blood Pressure: 167 / 74 Temperature: 97.4 F Heart Rate: 64  Physical Exam Constitutional: Well nourished and well developed . No acute distress.  ENT:. The ears and nose are normal in appearance.  Neck: The appearance of the neck is normal and no neck mass is present.  Pulmonary: No respiratory distress and normal respiratory rhythm and effort.  Cardiovascular: Heart rate and rhythm are normal . No peripheral edema.  Abdomen:  The abdomen is obese. The abdomen is soft and nontender. Incisional hernia is present.  Genitourinary: Examination of the penis demonstrates phimosis, but no discharge, no masses, no lesions and a normal meatus. The penis is uncircumcised. The scrotum is without lesions. The right epididymis is palpably normal and non-tender. The left epididymis is palpably normal and non-tender. The right testis is non-tender and without masses. The left testis is non-tender and without masses.  Neuro/Psych:. Mood and affect are appropriate.    Results/Data Urine [Data Includes: Last 1 Day]   02Oct2014 COLOR YELLOW  APPEARANCE CLEAR  SPECIFIC GRAVITY 1.025  pH 6.5  GLUCOSE NEG mg/dL BILIRUBIN NEG  KETONE NEG mg/dL BLOOD NEG  PROTEIN NEG mg/dL UROBILINOGEN 1 mg/dL NITRITE NEG  LEUKOCYTE ESTERASE SMALL  SQUAMOUS EPITHELIAL/HPF RARE  WBC 0-2 WBC/hpf RBC NONE SEEN RBC/hpf BACTERIA RARE  CRYSTALS NONE SEEN  CASTS NONE SEEN  Other MUCUS NOTED   Assessment Assessed  1. Urinary Frequency 788.41 2. 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 Phimosis 605  Plan Health Maintenance (V70.0)  1. UA With REFLEX  Done: 02Oct2014 01:05PM Urinary Frequency (788.41)  2. Follow-up Schedule Surgery Office  Follow-up  Done: 02Oct2014  Discussion/Summary   [name] clearly has significant phimosis. I suspect he had an incomplete circumcision as a neonate and now has really inability to retract his foreskin. He really has a pinhole opening and does need a revision of his circumcision. Given his medical history, it would certainly be safest to do this with IV sedation and a penile block. He will need to discontinue his aspirin, so we should get cardiac permission for that. We did talk about the procedure, typical recovery. He has voiding symptoms and may require additional assessment and treatment but I would like to start with the circumcision and see how much that helps him before embarking on any other evaluation or  treatment.

## 2013-10-20 NOTE — Anesthesia Preprocedure Evaluation (Addendum)
Anesthesia Evaluation  Patient identified by [name], date of birth, ID band Patient awake    Reviewed: Allergy & Precautions, H&P , NPO status , Patient's Chart, lab work & pertinent test results  Airway Mallampati: II TM Distance: >3 FB Neck ROM: Full    Dental  (+) Edentulous Lower and Edentulous Upper   Pulmonary shortness of breath, sleep apnea , pneumonia -, resolved,  CXR: No active disease. breath sounds clear to auscultation  Pulmonary exam normal       Cardiovascular hypertension, Pt. on home beta blockers and Pt. on medications + CAD and + Past MI negative cardio ROS  Rhythm:Regular Rate:Normal  ECG:SB 53, otherwise normal.   Neuro/Psych  Neuromuscular disease negative psych ROS   GI/Hepatic Neg liver ROS, hiatal hernia, GERD-  ,  Endo/Other  negative endocrine ROS  Renal/GU negative Renal ROS  negative genitourinary   Musculoskeletal negative musculoskeletal ROS (+)   Abdominal   Peds negative pediatric ROS (+)  Hematology negative hematology ROS (+)   Anesthesia Other Findings   Reproductive/Obstetrics negative OB ROS                         Anesthesia Physical Anesthesia Plan  ASA: III  Anesthesia Plan: General and MAC   Post-op Pain Management:    Induction: Intravenous  Airway Management Planned:   Additional Equipment:   Intra-op Plan:   Post-operative Plan:   Informed Consent: I have reviewed the patients History and Physical, chart, labs and discussed the procedure including the risks, benefits and alternatives for the proposed anesthesia with the patient or authorized representative who has indicated his/her understanding and acceptance.   Dental advisory given  Plan Discussed with: CRNA  Anesthesia Plan Comments:         Anesthesia Quick Evaluation

## 2013-10-21 ENCOUNTER — Ambulatory Visit (HOSPITAL_COMMUNITY): Payer: Medicare Other | Admitting: Anesthesiology

## 2013-10-21 ENCOUNTER — Encounter (HOSPITAL_COMMUNITY)
Admission: RE | Disposition: A | Discharge status: Home / Self Care | Payer: Self-pay | Source: Ambulatory Visit | Attending: Urology

## 2013-10-21 ENCOUNTER — Encounter (HOSPITAL_COMMUNITY): Payer: Medicare Other | Admitting: Anesthesiology

## 2013-10-21 ENCOUNTER — Encounter (HOSPITAL_COMMUNITY): Payer: Self-pay | Admitting: *Deleted

## 2013-10-21 ENCOUNTER — Ambulatory Visit (HOSPITAL_COMMUNITY)
Admission: RE | Admit: 2013-10-21 | Discharge: 2013-10-21 | Disposition: A | Discharge status: Home / Self Care | Payer: Medicare Other | Source: Ambulatory Visit | Attending: Urology | Admitting: Urology

## 2013-10-21 DIAGNOSIS — Z8711 Personal history of peptic ulcer disease: Secondary | ICD-10-CM | POA: Insufficient documentation

## 2013-10-21 DIAGNOSIS — R350 Frequency of micturition: Secondary | ICD-10-CM | POA: Insufficient documentation

## 2013-10-21 DIAGNOSIS — Z87891 Personal history of nicotine dependence: Secondary | ICD-10-CM | POA: Insufficient documentation

## 2013-10-21 DIAGNOSIS — N400 Enlarged prostate without lower urinary tract symptoms: Secondary | ICD-10-CM | POA: Insufficient documentation

## 2013-10-21 DIAGNOSIS — E7800 Pure hypercholesterolemia, unspecified: Secondary | ICD-10-CM | POA: Insufficient documentation

## 2013-10-21 DIAGNOSIS — N471 Phimosis: Secondary | ICD-10-CM | POA: Insufficient documentation

## 2013-10-21 DIAGNOSIS — R351 Nocturia: Secondary | ICD-10-CM | POA: Insufficient documentation

## 2013-10-21 DIAGNOSIS — IMO0001 Reserved for inherently not codable concepts without codable children: Secondary | ICD-10-CM

## 2013-10-21 DIAGNOSIS — R339 Retention of urine, unspecified: Secondary | ICD-10-CM | POA: Insufficient documentation

## 2013-10-21 DIAGNOSIS — I252 Old myocardial infarction: Secondary | ICD-10-CM | POA: Insufficient documentation

## 2013-10-21 DIAGNOSIS — K219 Gastro-esophageal reflux disease without esophagitis: Secondary | ICD-10-CM | POA: Insufficient documentation

## 2013-10-21 DIAGNOSIS — N478 Other disorders of prepuce: Secondary | ICD-10-CM | POA: Insufficient documentation

## 2013-10-21 DIAGNOSIS — N529 Male erectile dysfunction, unspecified: Secondary | ICD-10-CM | POA: Insufficient documentation

## 2013-10-21 DIAGNOSIS — N3941 Urge incontinence: Secondary | ICD-10-CM | POA: Insufficient documentation

## 2013-10-21 DIAGNOSIS — Z8042 Family history of malignant neoplasm of prostate: Secondary | ICD-10-CM | POA: Insufficient documentation

## 2013-10-21 HISTORY — PX: CYSTOSCOPY: SHX5120

## 2013-10-21 HISTORY — PX: CIRCUMCISION: SHX1350

## 2013-10-21 HISTORY — DX: Reserved for inherently not codable concepts without codable children: IMO0001

## 2013-10-21 SURGERY — CIRCUMCISION ADULT
Anesthesia: Monitor Anesthesia Care | Wound class: Clean Contaminated

## 2013-10-21 MED ORDER — CEFAZOLIN SODIUM-DEXTROSE 2-3 GM-% IV SOLR
INTRAVENOUS | Status: AC
  Filled 2013-10-21: qty 50

## 2013-10-21 MED ORDER — BACITRACIN ZINC 500 UNIT/GM EX OINT
TOPICAL_OINTMENT | CUTANEOUS | Status: AC
  Filled 2013-10-21: qty 28.35

## 2013-10-21 MED ORDER — PROMETHAZINE HCL 25 MG/ML IJ SOLN: 6.2500 mg | INTRAMUSCULAR | Status: DC | PRN

## 2013-10-21 MED ORDER — LIDOCAINE HCL 2 % EX GEL
CUTANEOUS | Status: DC | PRN
  Administered 2013-10-21: 1 "application " via URETHRAL

## 2013-10-21 MED ORDER — LIDOCAINE HCL (PF) 1 % IJ SOLN
INTRAMUSCULAR | Status: DC | PRN
  Administered 2013-10-21: 10 mL

## 2013-10-21 MED ORDER — FENTANYL CITRATE 0.05 MG/ML IJ SOLN: 25.0000 ug | INTRAMUSCULAR | Status: DC | PRN

## 2013-10-21 MED ORDER — LACTATED RINGERS IV SOLN
INTRAVENOUS | Status: DC | PRN
  Administered 2013-10-21: 07:00:00 via INTRAVENOUS

## 2013-10-21 MED ORDER — FENTANYL CITRATE 0.05 MG/ML IJ SOLN
INTRAMUSCULAR | Status: DC | PRN
  Administered 2013-10-21: 50 ug via INTRAVENOUS
  Administered 2013-10-21 (×2): 25 ug via INTRAVENOUS

## 2013-10-21 MED ORDER — LACTATED RINGERS IV SOLN: INTRAVENOUS | Status: DC

## 2013-10-21 MED ORDER — BUPIVACAINE HCL (PF) 0.25 % IJ SOLN
INTRAMUSCULAR | Status: AC
  Filled 2013-10-21: qty 30

## 2013-10-21 MED ORDER — CEFAZOLIN SODIUM-DEXTROSE 2-3 GM-% IV SOLR
2.0000 g | INTRAVENOUS | Status: AC
  Administered 2013-10-21: 2 g via INTRAVENOUS

## 2013-10-21 MED ORDER — MIDAZOLAM HCL 5 MG/5ML IJ SOLN
INTRAMUSCULAR | Status: DC | PRN
  Administered 2013-10-21 (×2): 1 mg via INTRAVENOUS

## 2013-10-21 MED ORDER — ONDANSETRON HCL 4 MG/2ML IJ SOLN
INTRAMUSCULAR | Status: DC | PRN
  Administered 2013-10-21: 4 mg via INTRAVENOUS

## 2013-10-21 MED ORDER — BUPIVACAINE HCL (PF) 0.25 % IJ SOLN
INTRAMUSCULAR | Status: DC | PRN
  Administered 2013-10-21: 10 mL

## 2013-10-21 MED ORDER — LIDOCAINE HCL 2 % EX GEL
CUTANEOUS | Status: AC
  Filled 2013-10-21: qty 10

## 2013-10-21 MED ORDER — PROPOFOL INFUSION 10 MG/ML OPTIME
INTRAVENOUS | Status: DC | PRN
  Administered 2013-10-21: 100 ug/kg/min via INTRAVENOUS

## 2013-10-21 MED ORDER — LIDOCAINE HCL (PF) 2 % IJ SOLN
INTRAMUSCULAR | Status: DC | PRN
  Administered 2013-10-21: 40 mg

## 2013-10-21 MED ORDER — LIDOCAINE HCL 1 % IJ SOLN
INTRAMUSCULAR | Status: AC
  Filled 2013-10-21: qty 20

## 2013-10-21 MED ORDER — HYDROCODONE-ACETAMINOPHEN 5-325 MG PO TABS: 1.0000 | ORAL_TABLET | Freq: Four times a day (QID) | ORAL | Status: DC | PRN

## 2013-10-21 MED ORDER — BACITRACIN ZINC 500 UNIT/GM EX OINT
TOPICAL_OINTMENT | CUTANEOUS | Status: DC | PRN
  Administered 2013-10-21: 1 "application " via TOPICAL

## 2013-10-21 SURGICAL SUPPLY — 27 items
BANDAGE COBAN STERILE 2 (GAUZE/BANDAGES/DRESSINGS) ×2
BLADE SURG 15 STRL LF DISP TIS (BLADE) ×1
BLADE SURG 15 STRL SS (BLADE) ×1
CLOTH BEACON ORANGE TIMEOUT ST (SAFETY) ×2
COVER SURGICAL LIGHT HANDLE (MISCELLANEOUS) ×2
DECANTER SPIKE VIAL GLASS SM (MISCELLANEOUS)
DRAPE LAPAROTOMY T 102X78X121 (DRAPES)
DRAPE PED LAPAROTOMY (DRAPES) ×2
DRSG XEROFORM 1X8 (GAUZE/BANDAGES/DRESSINGS) ×2
ELECT NEEDLE TIP 2.8 STRL (NEEDLE) ×2
ELECT REM PT RETURN 9FT ADLT (ELECTROSURGICAL) ×2
GAUZE SPONGE 4X4 16PLY XRAY LF (GAUZE/BANDAGES/DRESSINGS) ×2
GAUZE XEROFORM 5X9 LF (GAUZE/BANDAGES/DRESSINGS) ×2
GLOVE BIOGEL M STRL SZ7.5 (GLOVE) ×2
GOWN STRL REIN XL XLG (GOWN DISPOSABLE) ×2
KIT BASIN OR (CUSTOM PROCEDURE TRAY) ×2
NEEDLE HYPO 25X1 1.5 SAFETY (NEEDLE) ×2
NS IRRIG 1000ML POUR BTL (IV SOLUTION) ×2
PACK BASIC VI WITH GOWN DISP (CUSTOM PROCEDURE TRAY) ×2
PENCIL BUTTON HOLSTER BLD 10FT (ELECTRODE) ×2
SPONGE GAUZE 4X4 12PLY (GAUZE/BANDAGES/DRESSINGS) ×2
SUT VIC AB 3-0 PS2 18 (SUTURE) ×2
SUT VIC AB 3-0 PS2 18XBRD (SUTURE) ×2
SUT VIC AB 4-0 PS2 27 (SUTURE)
SUT VIC AB 5-0 RB1 27 (SUTURE)
SYR CONTROL 10ML LL (SYRINGE) ×2
WATER STERILE IRR 1500ML POUR (IV SOLUTION)

## 2013-10-21 NOTE — Discharge Instructions (Signed)
 Postoperative instructions for circumcision  Wound:  Remove the dressing the morning after surgery. In most cases your incision will have absorbable sutures that run along the course of your incision and will dissolve within the first 10-20 days. Some will fall out even earlier. Expect some redness as the sutures dissolved but this should occur only around the sutures. If there is generalized redness, especially with increasing pain or swelling, let us  know. The penis will very likely get black and blue as the blood in the tissues spread. Sometimes the whole penis will turn colors. The black and blue is followed by a yellow and brown color. In time, all the discoloration will go away.  Diet:  You may return to your normal diet within 24 hours following your surgery. You may note some mild nausea and possibly vomiting the first 6-8 hours following surgery. This is usually due to the side effects of anesthesia, and will disappear quite soon. I would suggest clear liquids and a very light meal the first evening following your surgery.  Activity:  Your physical activity should be restricted the first 48 hours. During that time you should remain relatively inactive, moving about only when necessary. During the first 7-10 days following surgery he should avoid lifting any heavy objects (anything greater than 15 pounds), and avoid strenuous exercise. If you work, ask us  specifically about your restrictions, both for work and home. We will write a note to your employer if needed.  Ice packs can be placed on and off over the penis for the first 48 hours to help relieve the pain and keep the swelling down. Frozen peas or corn in a ZipLock bag can be frozen, used and re-frozen. Fifteen minutes on and 15 minutes off is a reasonable schedule.  No sexual activity for 1 month.  Hygiene:  You may shower 48 hours after your surgery. Tub bathing should be restricted until the seventh day.  Medication:  You  will be sent home with some type of pain medication. In many cases you will be sent home with a narcotic pain pill (Vicodin or Tylox). If the pain is not too bad, you may take either Tylenol  (acetaminophen ) or Advil  (ibuprofen ) which contain no narcotic agents, and might be tolerated a little better, with fewer side effects. If the pain medication you are sent home with does not control the pain, you will have to let us  know. Some narcotic pain medications cannot be given or refilled by a phone call to a pharmacy. You may restart Aspirin  in 2-3 days  Problems you should report to us :   Fever of 101.0 degrees Fahrenheit or greater.  Moderate or severe swelling under the skin incision or involving the scrotum.  Drug reaction such as hives, a rash, nausea or vomiting.

## 2013-10-21 NOTE — Interval H&P Note (Signed)
History and Physical Interval Note:  10/21/2013 7:30 AM  [facility]  has presented today for surgery, with the diagnosis of PHIMOSIS, BENIGN PROSTATIC HYPERTROPHY  The various methods of treatment have been discussed with the patient and family. After consideration of risks, benefits and other options for treatment, the patient has consented to  Procedure(s): CIRCUMCISION ADULT (N/A) CYSTOSCOPY FLEXIBLE (N/A) as a surgical intervention .  The patient's history has been reviewed, patient examined, no change in status, stable for surgery.  I have reviewed the patient's chart and labs.  Questions were answered to the patient's satisfaction.     Keshia Weare S

## 2013-10-21 NOTE — Preoperative (Signed)
Beta Blockers   Reason not to administer Beta Blockers:Not Applicable, Took this AM

## 2013-10-21 NOTE — Transfer of Care (Signed)
Immediate Anesthesia Transfer of Care Note  Patient: [facility]  Procedure(s) Performed: Procedure(s) (LRB): CIRCUMCISION ADULT (N/A) CYSTOSCOPY FLEXIBLE (N/A)  Patient Location: PACU  Anesthesia Type: MAC  Level of Consciousness: sedated, patient cooperative and responds to stimulation  Airway & Oxygen Therapy: Patient Spontanous Breathing and Patient connected to face mask oxgen  Post-op Assessment: Report given to PACU RN and Post -op Vital signs reviewed and stable  Post vital signs: Reviewed and stable  Complications: No apparent anesthesia complications

## 2013-10-21 NOTE — Op Note (Signed)
Preoperative diagnosis: Phimosis/BPH Postoperative diagnosis: Same  Procedure: Circumcision/flexible cystoscopy   Surgeon: Valetta Fuller M.D.  Anesthesia: MAC with penile block Indications: Patient is 67 years of age. He recently presented with years of inability to retract his foreskin. He had undergone neonatal circumcision but was felt that there was some redundant skin. He is unable to control his urinary stream and near to void sitting down. On clinical exam he had severe phimosis with a pinhole opening from his foreskin. There appeared to be adhesions covering the glans penis. We discussed options with them including the pros and cons of elective circumcision. He also expressed BPH symptoms and we felt would be imprudent time to go ahead with flexible cystoscopy.     Technique and findings: Patient was brought the operating room where he had some IV sedation and careful monitoring by anesthesia services. He was placed supine and prepped and draped in usual manner. Appropriate surgical timeout was performed. He was covered with perioperative antibiotics. A standard penile block was performed a combination of Marcaine and lidocaine. A dorsal slit was performed initially to allow for eventual retraction of some very thickened and chronically inflamed foreskin. A second circumferential incision was made behind the glans penis and the strip of redundant foreskin that was thickened was removed. There were some adhesions to the glans penis. There was a chronic inflammatory picture but no evidence of gross infection. The meatus appear slightly inflamed but did not appear to be markedly stenotic. Skin edges were reapproximated with interrupted 3-0 Vicryl suture. Hemostasis was excellent. Flexible cystoscopy revealed unremarkable anterior urethra. The patient at 3-1/2 cm prostatic urethra with mild trilobar hyperplasia but no significant visual obstruction. The bladder showed no other pathology.

## 2013-10-21 NOTE — Anesthesia Postprocedure Evaluation (Signed)
  Anesthesia Post-op Note  Patient: [facility]  Procedure(s) Performed: Procedure(s) (LRB): CIRCUMCISION ADULT (N/A) CYSTOSCOPY FLEXIBLE (N/A)  Patient Location: PACU  Anesthesia Type: MAC  Level of Consciousness: awake and alert   Airway and Oxygen Therapy: Patient Spontanous Breathing  Post-op Pain: mild  Post-op Assessment: Post-op Vital signs reviewed, Patient's Cardiovascular Status Stable, Respiratory Function Stable, Patent Airway and No signs of Nausea or vomiting  Last Vitals:  Filed Vitals:   10/21/13 0956  BP: 128/53  Pulse: 56  Temp:   Resp: 16    Post-op Vital Signs: stable   Complications: No apparent anesthesia complications

## 2013-10-22 ENCOUNTER — Encounter (HOSPITAL_COMMUNITY): Payer: Self-pay | Admitting: Urology

## 2013-10-24 ENCOUNTER — Ambulatory Visit: Payer: Self-pay | Admitting: Podiatry

## 2013-11-01 ENCOUNTER — Ambulatory Visit: Payer: Medicare Other | Admitting: Internal Medicine

## 2013-11-04 ENCOUNTER — Ambulatory Visit: Payer: Medicare Other | Admitting: Podiatry

## 2013-11-08 ENCOUNTER — Other Ambulatory Visit: Payer: Self-pay | Admitting: Internal Medicine

## 2013-11-29 ENCOUNTER — Telehealth: Payer: Self-pay | Admitting: *Deleted

## 2013-11-29 NOTE — Telephone Encounter (Signed)
Spoke with pts wife re: Topamax that was given to him by a different doctor. She stated that she wanted dr. Al Corpus to start treating him for his neuropathy and she wanted a rx for it. Per hyatt  They are to call his doctors office that subscribed the Topamax let them know that she wants dr Al Corpus to treat him and see what they tell her. He will be happy to start treating pt for his neuropathy.

## 2013-12-02 ENCOUNTER — Encounter: Payer: Self-pay | Admitting: Podiatry

## 2013-12-02 ENCOUNTER — Ambulatory Visit (INDEPENDENT_AMBULATORY_CARE_PROVIDER_SITE_OTHER): Payer: Medicare Other | Admitting: Podiatry

## 2013-12-02 VITALS — BP 106/86 | HR 102 | Resp 16

## 2013-12-02 DIAGNOSIS — L97529 Non-pressure chronic ulcer of other part of left foot with unspecified severity: Secondary | ICD-10-CM

## 2013-12-02 DIAGNOSIS — G589 Mononeuropathy, unspecified: Secondary | ICD-10-CM

## 2013-12-02 DIAGNOSIS — M79609 Pain in unspecified limb: Secondary | ICD-10-CM

## 2013-12-02 DIAGNOSIS — L97509 Non-pressure chronic ulcer of other part of unspecified foot with unspecified severity: Secondary | ICD-10-CM

## 2013-12-02 MED ORDER — GABAPENTIN 300 MG PO CAPS: ORAL_CAPSULE | ORAL | Status: DC

## 2013-12-02 NOTE — Progress Notes (Signed)
[facility] presents today stating that might neuropathy which is killing I haven't had sleep in the past several nights states that he is Topamax has not been working for him he has tried Lyrica in the past which resulted in painful edema which was then discontinue. He states that the pain is so bad he can hardly stand it. He also has a superficial ulceration fourth digit left foot.  Objective: Vital signs are stable he is alert and oriented x3 pulses remain palpable bilateral lower extremity. Neurologic sensorium is not intact per Semmes-Weinstein monofilament and is lacking to the level of the midfoot. Superficial ulceration appears to be healing to the fourth digit left foot there is no erythema edema saline is drainage or odor associated with it.  Assessment: Idiopathic neuropathy bilateral foot. Rule out osteomyelitis fourth digit left foot.  Plan: Continue conservative therapies fourth toe left foot. We are starting him on Neurontin 300 mg starting at night to be increased over the next 3 weeks to 3 times a day. I will followup with him in one month.

## 2013-12-04 ENCOUNTER — Ambulatory Visit: Payer: Medicare Other | Admitting: Podiatry

## 2013-12-20 ENCOUNTER — Ambulatory Visit (INDEPENDENT_AMBULATORY_CARE_PROVIDER_SITE_OTHER): Payer: Medicare Other | Admitting: Internal Medicine

## 2013-12-20 ENCOUNTER — Encounter: Payer: Self-pay | Admitting: Internal Medicine

## 2013-12-20 ENCOUNTER — Other Ambulatory Visit (INDEPENDENT_AMBULATORY_CARE_PROVIDER_SITE_OTHER): Payer: Medicare Other

## 2013-12-20 VITALS — BP 170/86 | HR 65 | Temp 97.5°F | Ht 70.5 in | Wt 196.0 lb

## 2013-12-20 DIAGNOSIS — F329 Major depressive disorder, single episode, unspecified: Secondary | ICD-10-CM | POA: Insufficient documentation

## 2013-12-20 DIAGNOSIS — K219 Gastro-esophageal reflux disease without esophagitis: Secondary | ICD-10-CM | POA: Insufficient documentation

## 2013-12-20 DIAGNOSIS — R7309 Other abnormal glucose: Secondary | ICD-10-CM

## 2013-12-20 DIAGNOSIS — N400 Enlarged prostate without lower urinary tract symptoms: Secondary | ICD-10-CM | POA: Insufficient documentation

## 2013-12-20 DIAGNOSIS — R7302 Impaired glucose tolerance (oral): Secondary | ICD-10-CM

## 2013-12-20 DIAGNOSIS — G629 Polyneuropathy, unspecified: Secondary | ICD-10-CM | POA: Insufficient documentation

## 2013-12-20 DIAGNOSIS — Z0001 Encounter for general adult medical examination with abnormal findings: Secondary | ICD-10-CM | POA: Insufficient documentation

## 2013-12-20 DIAGNOSIS — F419 Anxiety disorder, unspecified: Secondary | ICD-10-CM | POA: Insufficient documentation

## 2013-12-20 DIAGNOSIS — Z0000 Encounter for general adult medical examination without abnormal findings: Secondary | ICD-10-CM

## 2013-12-20 DIAGNOSIS — G8929 Other chronic pain: Secondary | ICD-10-CM | POA: Insufficient documentation

## 2013-12-20 DIAGNOSIS — F32A Depression, unspecified: Secondary | ICD-10-CM | POA: Insufficient documentation

## 2013-12-20 DIAGNOSIS — F418 Other specified anxiety disorders: Secondary | ICD-10-CM | POA: Insufficient documentation

## 2013-12-20 DIAGNOSIS — Z23 Encounter for immunization: Secondary | ICD-10-CM

## 2013-12-20 HISTORY — DX: Encounter for general adult medical examination with abnormal findings: Z00.01

## 2013-12-20 HISTORY — DX: Impaired glucose tolerance (oral): R73.02

## 2013-12-20 LAB — HEMOGLOBIN A1C: Hgb A1c MFr Bld: 5.9 % (ref 4.6–6.5)

## 2013-12-20 MED ORDER — SIMVASTATIN 80 MG PO TABS: 80.0000 mg | ORAL_TABLET | Freq: Every evening | ORAL | Status: DC

## 2013-12-20 MED ORDER — IRBESARTAN 300 MG PO TABS: 300.0000 mg | ORAL_TABLET | Freq: Every day | ORAL | Status: DC

## 2013-12-20 NOTE — Assessment & Plan Note (Addendum)

## 2013-12-20 NOTE — Patient Instructions (Addendum)
You had the new Prevnar pneumonia shot today OK to stop the losartan 50 mg Please take all new medication as prescribed - the generic Avapro at 300 mg (dont be concerned about the high number, this is just how it works) Please continue all other medications as before, and refills have been done if requested - the simvastatin Please have the pharmacy call with any other refills you may need. Please continue your efforts at being more active, low cholesterol diet, and weight control. You are otherwise up to date with prevention measures today.  Please keep your appointments with your specialists as you have planned  Please go to the LAB in the Basement (turn left off the elevator) for the tests to be done today You will be contacted by phone if any changes need to be made immediately.  Otherwise, you will receive a letter about your results with an explanation, but please check with MyChart first.  Please remember to sign up for My Chart if you have not done so, as this will be important to you in the future with finding out test results, communicating by private email, and scheduling acute appointments online when needed.  Please return in 6 months, or sooner if needed

## 2013-12-20 NOTE — Progress Notes (Signed)
Subjective:    Patient ID: Albert Wade, male    DOB: May 22, 1946, 67 y.o.   [name]: [id number]  HPI  Here for establish and f/u;  Overall doing ok;  Pt denies CP, worsening SOB, DOE, wheezing, orthopnea, PND, worsening LE edema, palpitations, dizziness or syncope.  Pt denies neurological change such as new headache, facial or extremity weakness.  Pt denies polydipsia, polyuria, or low sugar symptoms. Pt states overall good compliance with treatment and medications, good tolerability, and has been trying to follow lower cholesterol diet.  Pt denies worsening depressive symptoms, suicidal ideation or panic. No fever, night sweats, wt loss, loss of appetite, or other constitutional symptoms.  Pt states good ability with ADL's, has low fall risk, home safety reviewed and adequate, no other significant changes in hearing or vision, and only occasionally active with exercise due to chronic LBP s/p mult surguries, disabled from truck driving for several yrs.  Sees Dr Andrey Campanile for this, as well as Dr Isabel Caprice for urology with psa. Past Medical History  Diagnosis Date  . CAD (coronary artery disease)   . Hypercholesteremia   . GERD (gastroesophageal reflux disease)   . Pneumonia   . Anxiety   . Chronic back pain   . Chronic bronchitis   . Depression   . BPH (benign prostatic hypertrophy)   . MI (myocardial infarction)     mid-'90's  . Sleep apnea     mild-no cpap, unable to tolerate  . Neuromuscular disorder     peripheral neuropathy  . H/O hiatal hernia   . Umbilical hernia   . Arthritis     back,wrists,hx. previous fractures  . Varicose veins    Past Surgical History  Procedure Laterality Date  . Appendectomy    . Angioplasty    . Cardiac catheterization      11'11  . Wrist surgery Right   . Back surgery      x3  . Circumcision N/A 10/21/2013    Procedure: CIRCUMCISION ADULT;  Surgeon: Valetta Fuller, MD;  Location: [facility];  Service: Urology;  Laterality: N/A;  . Cystoscopy N/A  10/21/2013    Procedure: CYSTOSCOPY FLEXIBLE;  Surgeon: Valetta Fuller, MD;  Location: [facility];  Service: Urology;  Laterality: N/A;    reports that he quit smoking about 17 years ago. He has never used smokeless tobacco. He reports that he does not drink alcohol or use illicit drugs. family history includes Emphysema in his father; Hyperlipidemia in his father and mother; Prostate cancer in his father. No Known Allergies Current Outpatient Prescriptions on File Prior to Visit  Medication Sig Dispense Refill  . budesonide-formoterol (SYMBICORT) 160-4.5 MCG/ACT inhaler Inhale 2 puffs into the lungs 2 (two) times daily. As needed      . cyclobenzaprine (FLEXERIL) 10 MG tablet Take 10 mg by mouth 2 (two) times daily as needed for muscle spasms.       . Fluticasone-Salmeterol (ADVAIR) 250-50 MCG/DOSE AEPB Inhale 1 puff into the lungs every 12 (twelve) hours. As needed only(not in 2 months)      . furosemide (LASIX) 40 MG tablet Take 40-80 mg by mouth daily. Alternates 40 mg 60 mg and 80 mg      . gabapentin (NEURONTIN) 300 MG capsule Take one capsule by mouth at night for one week. Progress to taking one capsule by mouth in the morning and before bed for the second week. Then start one capsule by mouth three times daily.  90 capsule  5  . losartan (COZAAR) 50 MG tablet TAKE ONE (1) TABLET EACH DAY  90 tablet  2  . methadone (DOLOPHINE) 10 MG tablet Take 10-20 mg by mouth as directed. Takes 1-2 tablets 3 times and may an additional dose if needed for pain not to exceed 7 tablets in 24 hour period      . metoprolol (LOPRESSOR) 50 MG tablet Take 25 mg by mouth every morning.      Marland Kitchen oxyCODONE-acetaminophen (PERCOCET) 10-325 MG per tablet Take 1 tablet by mouth every 4 (four) hours as needed for pain.       . potassium chloride SA (K-DUR,KLOR-CON) 20 MEQ tablet Take 20 mEq by mouth daily.      . sertraline (ZOLOFT) 100 MG tablet Take 100 mg by mouth 2 (two) times daily.       . simvastatin (ZOCOR) 80 MG  tablet Take 80 mg by mouth every evening.       No current facility-administered medications on file prior to visit.   Review of Systems Constitutional: Negative for diaphoresis, activity change, appetite change or unexpected weight change.  HENT: Negative for hearing loss, ear pain, facial swelling, mouth sores and neck stiffness.   Eyes: Negative for pain, redness and visual disturbance.  Respiratory: Negative for shortness of breath and wheezing.   Cardiovascular: Negative for chest pain and palpitations.  Gastrointestinal: Negative for diarrhea, blood in stool, abdominal distention or other pain Genitourinary: Negative for hematuria, flank pain or change in urine volume.  Musculoskeletal: Negative for myalgias and joint swelling.  Skin: Negative for color change and wound.  Neurological: Negative for syncope and numbness. other than noted Hematological: Negative for adenopathy.  Psychiatric/Behavioral: Negative for hallucinations, self-injury, decreased concentration and agitation.      Objective:   Physical Exam BP 170/86  Pulse 65  Temp(Src) 97.5 F (36.4 C) (Oral)  Ht 5' 10.5" (1.791 m)  Wt 196 lb (88.905 kg)  BMI 27.72 kg/m2  SpO2 95% VS noted,  Constitutional: Pt is oriented to person, place, and time. Appears well-developed and well-nourished.  Head: Normocephalic and atraumatic.  Right Ear: External ear normal.  Left Ear: External ear normal.  Nose: Nose normal.  Mouth/Throat: Oropharynx is clear and moist.  Eyes: Conjunctivae and EOM are normal. Pupils are equal, round, and reactive to light.  Neck: Normal range of motion. Neck supple. No JVD present. No tracheal deviation present.  Cardiovascular: Normal rate, regular rhythm, normal heart sounds and intact distal pulses.   Pulmonary/Chest: Effort normal and breath sounds normal.  Abdominal: Soft. Bowel sounds are normal. There is no tenderness. No HSM  Musculoskeletal: Normal range of motion. Exhibits no edema.    Lymphadenopathy:  Has no cervical adenopathy.  Neurological: Pt is alert and oriented to person, place, and time. Pt has normal reflexes. No cranial nerve deficit.  Skin: Skin is warm and dry. No rash noted.  Psychiatric:  Has  normal mood and affect. Behavior is normal.     Assessment & Plan:

## 2013-12-20 NOTE — Assessment & Plan Note (Signed)
For a1c today  

## 2013-12-20 NOTE — Progress Notes (Signed)
Pre-visit discussion using our clinic review tool. No additional management support is needed unless otherwise documented below in the visit note.  

## 2013-12-24 ENCOUNTER — Telehealth: Payer: Self-pay | Admitting: *Deleted

## 2013-12-24 NOTE — Telephone Encounter (Signed)
Sorry, there is none effective that I am aware

## 2013-12-24 NOTE — Telephone Encounter (Signed)
Patient informed. 

## 2013-12-24 NOTE — Telephone Encounter (Signed)
Pt called requesting natural or herbal medication for worsening foot neuropathy.  Please advise

## 2013-12-30 ENCOUNTER — Ambulatory Visit: Payer: Medicare Other | Admitting: Podiatry

## 2014-01-13 ENCOUNTER — Telehealth: Payer: Self-pay | Admitting: *Deleted

## 2014-01-13 DIAGNOSIS — I10 Essential (primary) hypertension: Secondary | ICD-10-CM

## 2014-01-13 MED ORDER — POTASSIUM CHLORIDE ER 10 MEQ PO TBCR: EXTENDED_RELEASE_TABLET | ORAL | Status: DC

## 2014-01-13 NOTE — Telephone Encounter (Signed)
I am guessing pt does not want to take the [place] Con 20 per day due to the size of the pill which many patients complain about  Needs to take the potassium every day he takes the lasix.  Ok to change the [employer] con to 10 meq - 2 per day instead  We can check his BMET as well if ok with pt - 401.1

## 2014-01-13 NOTE — Telephone Encounter (Signed)
Albert Wade, spouse, phoned wanting to clarify if patient still needs to take potassium in addition to newly prescribed bp medication.  I told her that the potassium was b/c he was prescribed lasix, which she replied by saying that he had not taken it in a couple of months.  Now she wants to know if PCP wants pt taking lasix.  Please advise.   CB# [phone]

## 2014-01-14 NOTE — Telephone Encounter (Signed)
Pt appears to have some confusion about his meds. Please re-send last AVS to him, and let him know the med list is included.  He should take the avapro, stop the losartan, and cont the metoprolol

## 2014-01-14 NOTE — Telephone Encounter (Signed)
Notified pt with md response.../lmb 

## 2014-01-14 NOTE — Telephone Encounter (Signed)
Called pt with md response. Msg that was took before in incorrect. Pt states he has never taking potassium. What he wanted to know since md rx Avapro does he need to stop taking the losartan as well. Also he states md told him to stop taking the metoprolol. Did not see that in ov notes from 12/20/13. Pls clarify if pt need to take metoprolol...Albert Wade

## 2014-01-15 ENCOUNTER — Ambulatory Visit (INDEPENDENT_AMBULATORY_CARE_PROVIDER_SITE_OTHER): Payer: Medicare HMO | Admitting: Podiatry

## 2014-01-15 ENCOUNTER — Encounter: Payer: Self-pay | Admitting: Podiatry

## 2014-01-15 VITALS — BP 114/56 | HR 63 | Resp 16 | Ht 70.0 in | Wt 190.0 lb

## 2014-01-15 DIAGNOSIS — L97509 Non-pressure chronic ulcer of other part of unspecified foot with unspecified severity: Secondary | ICD-10-CM

## 2014-01-15 DIAGNOSIS — G5790 Unspecified mononeuropathy of unspecified lower limb: Secondary | ICD-10-CM

## 2014-01-15 MED ORDER — GABAPENTIN 300 MG PO CAPS: ORAL_CAPSULE | ORAL | Status: DC

## 2014-01-15 NOTE — Progress Notes (Signed)
He presents today for followup of his ulceration and infection to the fourth digit of the left foot. He is also following up for neuropathy to the bilateral foot. States that he is recently been diagnosed with pre-diabetes. He states that the medication appears to work very well and has decreased many of his symptoms. However he is starting to have pain along the lateral aspect of the fourth digit left foot where his ulceration was previously.  Objective: Vital signs are stable he is alert and oriented x3 pulses are palpable left lower remedy. Fourth digit mildly edematous there is no erythema cellulitis drainage or odor superficial ulceration fourth lateral was debrided today and cleaned up.  Assessment: Chronic ulceration fourth digit left foot. Neuropathy bilateral foot. Recently diagnosed with diabetes.  Plan: Discussed etiology pathology conservative versus surgical therapies. We are going to increase his Neurontin. He is currently taking 300 mg 3 times a day we will continue to take 300 mg in the morning and lunch and increase it to 600 mg at bedtime. He will continue conservative therapies of his left foot. We'll followup with him in about 6 weeks or so

## 2014-02-26 ENCOUNTER — Ambulatory Visit: Payer: Medicare HMO | Admitting: Podiatry

## 2014-04-27 NOTE — Progress Notes (Signed)
HPI Patient is a 68 year old with a history of CAD (mild by cath in 10/2010), COPD, dyslipidemia and hyptertension.  saw him in clinic in June 2014 Since then he was seen by L Gerhardt in the fall  Cleared for GU surgery. Since seen he has done well from a cardiac standpoint.  Does however have spells of palpitations that can last a couple min.   No Known Allergies  Current Outpatient Prescriptions  Medication Sig Dispense Refill  . aspirin 81 MG tablet Take 81 mg by mouth daily.      . cyclobenzaprine (FLEXERIL) 10 MG tablet Take 10 mg by mouth 2 (two) times daily as needed for muscle spasms.       . furosemide (LASIX) 40 MG tablet Take 40-80 mg by mouth daily. Alternates 40 mg 60 mg and 80 mg      . gabapentin (NEURONTIN) 300 MG capsule Take one capsule by mouth breakfast and lunch and two capsules by mouth at bed time.  120 capsule  3  . irbesartan (AVAPRO) 300 MG tablet Take 1 tablet (300 mg total) by mouth daily.  90 tablet  3  . methadone (DOLOPHINE) 10 MG tablet Take 10-20 mg by mouth as directed. Takes 1-2 tablets 3 times and may an additional dose if needed for pain not to exceed 7 tablets in 24 hour period      . oxyCODONE-acetaminophen (PERCOCET) 10-325 MG per tablet Take 1 tablet by mouth every 4 (four) hours as needed for pain.       . potassium chloride (KLOR-CON 10) 10 MEQ tablet 2 tabs by mouth per day on days taking furosemide  180 tablet  3  . sertraline (ZOLOFT) 100 MG tablet Take 100 mg by mouth 2 (two) times daily.       . simvastatin (ZOCOR) 80 MG tablet Take 1 tablet (80 mg total) by mouth every evening.  90 tablet  3   No current facility-administered medications for this visit.    Past Medical History  Diagnosis Date  . CAD (coronary artery disease)   . Hypercholesteremia   . GERD (gastroesophageal reflux disease)   . Pneumonia   . Anxiety   . Chronic back pain   . Chronic bronchitis   . Depression   . BPH (benign prostatic hypertrophy)   . MI (myocardial  infarction)     mid-'90's  . Sleep apnea     mild-no cpap, unable to tolerate  . Peripheral neuropathy     peripheral neuropathy  . H/O hiatal hernia   . Umbilical hernia   . Arthritis     back,wrists,hx. previous fractures  . Varicose veins     Past Surgical History  Procedure Laterality Date  . Appendectomy    . Angioplasty    . Cardiac catheterization      11'11  . Wrist surgery Right   . Back surgery      x3  . Circumcision N/A 10/21/2013    Procedure: CIRCUMCISION ADULT;  Surgeon: Bernestine Amass, MD;  Location: [facility];  Service: Urology;  Laterality: N/A;  . Cystoscopy N/A 10/21/2013    Procedure: CYSTOSCOPY FLEXIBLE;  Surgeon: Bernestine Amass, MD;  Location: [facility];  Service: Urology;  Laterality: N/A;    Family History  Problem Relation Age of Onset  . Prostate cancer Father   . Emphysema Father   . Hyperlipidemia Father   . Hyperlipidemia Mother     History   Social History  .  Marital Status: Married    Spouse Name: N/A    Number of Children: N/A  . Years of Education: N/A   Occupational History  . retired Administrator    Social History Main Topics  . Smoking status: Former Smoker -- 1.50 packs/day for 40 years    Quit date: 12/27/1995  . Smokeless tobacco: Never Used  . Alcohol Use: No  . Drug Use: No  . Sexual Activity: Yes   Other Topics Concern  . Not on file   Social History Narrative  . No narrative on file    Review of Systems:  All systems reviewed.  They are negative to the above problem except as previously stated.  Vital Signs: BP 150/68  Pulse 63  Ht 5\' 10"  (1.778 m)  Wt 207 lb (93.895 kg)  BMI 29.70 kg/m2  SpO2 99%  Physical Exam Patient is in NAD at rest. HEENT:  Normocephalic, atraumatic. EOMI, PERRLA.  Neck: JVP is normal.  No bruits.  Lungs: Decreased airflow  Some pops, rhonchi. Heart: Regular rate and rhythm. Normal S1, S2. No S3.   No significant murmurs. PMI not displaced.  Abdomen:  Supple, nontender. Normal  bowel sounds. No masses. No hepatomegaly.  Extremities:   Good distal pulses throughout. No lower extremity edema.  Musculoskeletal :moving all extremities.  Neuro:   alert and oriented x3.  CN II-XII grossly intact.  EKG  NSR 66 bpm  Assessment and Plan:  1.  Pulmonary.Stable  2.  CAD  Mild by cath  No symptoms    3.  Palpitations  Will set up for event monitor    3.  HL  4.  HTN  Follow  No changes for now.

## 2014-04-28 ENCOUNTER — Ambulatory Visit (INDEPENDENT_AMBULATORY_CARE_PROVIDER_SITE_OTHER): Payer: Commercial Managed Care - HMO | Admitting: Internal Medicine

## 2014-04-28 ENCOUNTER — Encounter: Payer: Self-pay | Admitting: Internal Medicine

## 2014-04-28 VITALS — BP 150/68 | HR 63 | Ht 70.0 in | Wt 207.0 lb

## 2014-04-28 DIAGNOSIS — R002 Palpitations: Secondary | ICD-10-CM

## 2014-04-28 LAB — BASIC METABOLIC PANEL WITH GFR
BUN: 12 mg/dL (ref 6–23)
CO2: 33 meq/L — ABNORMAL HIGH (ref 19–32)
Calcium: 9.6 mg/dL (ref 8.4–10.5)
Chloride: 106 meq/L (ref 96–112)
Creatinine, Ser: 1 mg/dL (ref 0.4–1.5)
GFR: 82.75 mL/min
Glucose, Bld: 95 mg/dL (ref 70–99)
Potassium: 4.7 meq/L (ref 3.5–5.1)
Sodium: 147 meq/L — ABNORMAL HIGH (ref 135–145)

## 2014-04-28 NOTE — Patient Instructions (Signed)
Your physician wants you to follow-up in: [place] will receive a reminder letter in the mail two months in advance. If you don't receive a letter, please call our office to schedule the follow-up appointment.   Your physician recommends that you HAVE LAB WORK TODAY  Your physician has recommended that you wear an event monitor. Event monitors are medical devices that record the heart's electrical activity. Doctors most often [place] these monitors to diagnose arrhythmias. Arrhythmias are problems with the speed or rhythm of the heartbeat. The monitor is a small, portable device. You can wear one while you do your normal daily activities. This is usually used to diagnose what is causing palpitations/syncope (passing out).

## 2014-05-01 ENCOUNTER — Encounter (INDEPENDENT_AMBULATORY_CARE_PROVIDER_SITE_OTHER): Payer: Commercial Managed Care - HMO

## 2014-05-01 ENCOUNTER — Encounter: Payer: Self-pay | Admitting: *Deleted

## 2014-05-01 DIAGNOSIS — R002 Palpitations: Secondary | ICD-10-CM

## 2014-05-01 NOTE — Progress Notes (Signed)
Patient ID: Albert Wade, male   DOB: [birth year], 68 y.o.   [name]: [id number] [employer] 30 day cardiac event monitor applied to patient.

## 2014-05-06 ENCOUNTER — Other Ambulatory Visit: Payer: Self-pay

## 2014-05-06 MED ORDER — IRBESARTAN 300 MG PO TABS: 300.0000 mg | ORAL_TABLET | Freq: Every day | ORAL | Status: DC

## 2014-05-06 MED ORDER — SIMVASTATIN 80 MG PO TABS: 80.0000 mg | ORAL_TABLET | Freq: Every evening | ORAL | Status: DC

## 2014-05-08 ENCOUNTER — Other Ambulatory Visit: Payer: Self-pay

## 2014-05-08 MED ORDER — POTASSIUM CHLORIDE ER 10 MEQ PO TBCR: EXTENDED_RELEASE_TABLET | ORAL | Status: DC

## 2014-05-08 MED ORDER — FUROSEMIDE 40 MG PO TABS: 40.0000 mg | ORAL_TABLET | Freq: Every day | ORAL | Status: DC

## 2014-05-13 ENCOUNTER — Telehealth: Payer: Self-pay | Admitting: *Deleted

## 2014-05-13 NOTE — Telephone Encounter (Signed)
x

## 2014-05-15 ENCOUNTER — Other Ambulatory Visit: Payer: Self-pay

## 2014-05-15 MED ORDER — FUROSEMIDE 40 MG PO TABS: ORAL_TABLET | ORAL | Status: DC

## 2014-05-22 ENCOUNTER — Other Ambulatory Visit: Payer: Self-pay

## 2014-05-22 MED ORDER — SIMVASTATIN 80 MG PO TABS: 80.0000 mg | ORAL_TABLET | Freq: Every evening | ORAL | Status: DC

## 2014-05-22 MED ORDER — FUROSEMIDE 40 MG PO TABS: ORAL_TABLET | ORAL | Status: DC

## 2014-06-10 ENCOUNTER — Telehealth: Payer: Self-pay | Admitting: *Deleted

## 2014-06-10 ENCOUNTER — Telehealth: Payer: Self-pay

## 2014-06-10 MED ORDER — SIMVASTATIN 80 MG PO TABS: 80.0000 mg | ORAL_TABLET | Freq: Every evening | ORAL | Status: DC

## 2014-06-10 NOTE — Telephone Encounter (Signed)
Spoke with pt, Monitor reviewed by dr Harrington Challenger shows sinus. No arrhythmia.

## 2014-06-10 NOTE — Telephone Encounter (Signed)
Pt's wife called and stated that [facility] never received a refill for simvastatin.  Records show that they received the rx on 05/22/2014.  i resent out the rx again to the pharmacy

## 2014-06-20 ENCOUNTER — Ambulatory Visit: Payer: Medicare Other | Admitting: Internal Medicine

## 2014-06-24 ENCOUNTER — Telehealth: Payer: Self-pay

## 2014-06-24 MED ORDER — SIMVASTATIN 80 MG PO TABS: 80.0000 mg | ORAL_TABLET | Freq: Every evening | ORAL | Status: DC

## 2014-06-24 NOTE — Telephone Encounter (Signed)
Patient wife called to inform [facility] states the refill on Simvastatin does not contain an amount (80 mg) to take.  Script was sent in on 05/22/14 and 06/10/14.  The wife was aware of and she is very upset with [facility] as states we have completed our job correctly.  Informed would print the rx out and then fax to [facility].

## 2014-06-26 ENCOUNTER — Telehealth: Payer: Self-pay

## 2014-06-26 NOTE — Telephone Encounter (Signed)
Ok to cont as he has for now, has tolerated well, and is on no other medication with interaction

## 2014-06-26 NOTE — Telephone Encounter (Signed)
Pharmacy informed.

## 2014-06-26 NOTE — Telephone Encounter (Signed)
rx for Simvastatin 80 mg and less than 12 month hx of dose.  Max dose 40 mg for most patients to reduce risk of myopathy.  Should they continue to fill or change.

## 2014-07-22 ENCOUNTER — Encounter: Payer: Self-pay | Admitting: Internal Medicine

## 2014-10-25 NOTE — Progress Notes (Signed)
HPI Patient is a 68 year old with a history of CAD (mild by cath in 10/2010), COPD, dyslipidemia and hyptertension.  saw him in clinic in May 2015 No Known Allergies  Current Outpatient Prescriptions  Medication Sig Dispense Refill  . aspirin 81 MG tablet Take 81 mg by mouth daily.      . cyclobenzaprine (FLEXERIL) 10 MG tablet Take 10 mg by mouth 2 (two) times daily as needed for muscle spasms.       . furosemide (LASIX) 40 MG tablet Take1 tablet on day one(40 mg)Then take1 and1/2 tab on day two(60mg )and then take two tablets on daythree(80mg ).Then go back to day one  165 tablet  5  . gabapentin (NEURONTIN) 300 MG capsule Take one capsule by mouth breakfast and lunch and two capsules by mouth at bed time.  120 capsule  3  . irbesartan (AVAPRO) 300 MG tablet Take 1 tablet (300 mg total) by mouth daily.  90 tablet  3  . methadone (DOLOPHINE) 10 MG tablet Take 10-20 mg by mouth as directed. Takes 1-2 tablets 3 times and may an additional dose if needed for pain not to exceed 7 tablets in 24 hour period      . oxyCODONE-acetaminophen (PERCOCET) 10-325 MG per tablet Take 1 tablet by mouth every 4 (four) hours as needed for pain.       . potassium chloride (KLOR-CON 10) 10 MEQ tablet 2 tabs by mouth per day on days taking furosemide  180 tablet  3  . sertraline (ZOLOFT) 100 MG tablet Take 100 mg by mouth 2 (two) times daily.       . simvastatin (ZOCOR) 80 MG tablet Take 1 tablet (80 mg total) by mouth every evening.  90 tablet  3   No current facility-administered medications for this visit.    Past Medical History  Diagnosis Date  . CAD (coronary artery disease)   . Hypercholesteremia   . GERD (gastroesophageal reflux disease)   . Pneumonia   . Anxiety   . Chronic back pain   . Chronic bronchitis   . Depression   . BPH (benign prostatic hypertrophy)   . MI (myocardial infarction)     mid-'90's  . Sleep apnea     mild-no cpap, unable to tolerate  . Peripheral neuropathy     peripheral  neuropathy  . H/O hiatal hernia   . Umbilical hernia   . Arthritis     back,wrists,hx. previous fractures  . Varicose veins     Past Surgical History  Procedure Laterality Date  . Appendectomy    . Angioplasty    . Cardiac catheterization      11'11  . Wrist surgery Right   . Back surgery      x3  . Circumcision N/A 10/21/2013    Procedure: CIRCUMCISION ADULT;  Surgeon: Bernestine Amass, MD;  Location: [facility];  Service: Urology;  Laterality: N/A;  . Cystoscopy N/A 10/21/2013    Procedure: CYSTOSCOPY FLEXIBLE;  Surgeon: Bernestine Amass, MD;  Location: [facility];  Service: Urology;  Laterality: N/A;    Family History  Problem Relation Age of Onset  . Prostate cancer Father   . Emphysema Father   . Hyperlipidemia Father   . Hyperlipidemia Mother     History   Social History  . Marital Status: Married    Spouse Name: N/A    Number of Children: N/A  . Years of Education: N/A   Occupational History  . retired Administrator  Social History Main Topics  . Smoking status: Former Smoker -- 1.50 packs/day for 40 years    Quit date: 12/27/1995  . Smokeless tobacco: Never Used  . Alcohol Use: No  . Drug Use: No  . Sexual Activity: Yes   Other Topics Concern  . Not on file   Social History Narrative  . No narrative on file    Review of Systems:  All systems reviewed.  They are negative to the above problem except as previously stated.  Vital Signs: There were no vitals taken for this visit.  Physical Exam Patient is in NAD at rest. HEENT:  Normocephalic, atraumatic. EOMI, PERRLA.  Neck: JVP is normal.  No bruits.  Lungs: Decreased airflow  Some pops, rhonchi. Heart: Regular rate and rhythm. Normal S1, S2. No S3.   No significant murmurs. PMI not displaced.  Abdomen:  Supple, nontender. Normal bowel sounds. No masses. No hepatomegaly.  Extremities:   Good distal pulses throughout. No lower extremity edema.  Musculoskeletal :moving all extremities.  Neuro:   alert  and oriented x3.  CN II-XII grossly intact.  EKG  NSR 66 bpm  Assessment and Plan:  1.  Pulmonary.Stable  2.  CAD  Mild by cath  No symptoms    3.  Palpitations  Denies signif   3.  HL  WIll set up for fasting lipids    4.  HTN  Follow  No changes for now.

## 2014-10-27 ENCOUNTER — Encounter: Payer: Self-pay | Admitting: Internal Medicine

## 2014-10-27 ENCOUNTER — Ambulatory Visit (INDEPENDENT_AMBULATORY_CARE_PROVIDER_SITE_OTHER): Payer: Commercial Managed Care - HMO | Admitting: Internal Medicine

## 2014-10-27 VITALS — BP 130/72 | HR 72 | Ht 70.0 in | Wt 213.0 lb

## 2014-10-27 DIAGNOSIS — E785 Hyperlipidemia, unspecified: Secondary | ICD-10-CM

## 2014-10-27 NOTE — Patient Instructions (Signed)
Your physician recommends that you continue on your current medications as directed. Please refer to the Current Medication list given to you today. Your physician recommends that you return for lab work (cbc, bmet, lipids, ast)  Your physician wants you to follow-up in: 6 months with Dr. Harrington Challenger.  You will receive a reminder letter in the mail two months in advance. If you don't receive a letter, please call our office to schedule the follow-up appointment.

## 2014-12-30 ENCOUNTER — Ambulatory Visit: Payer: Commercial Managed Care - HMO | Admitting: Internal Medicine

## 2015-01-01 ENCOUNTER — Other Ambulatory Visit: Payer: Self-pay

## 2015-01-01 ENCOUNTER — Ambulatory Visit (INDEPENDENT_AMBULATORY_CARE_PROVIDER_SITE_OTHER)
Admission: RE | Admit: 2015-01-01 | Discharge: 2015-01-01 | Disposition: A | Discharge status: Home / Self Care | Payer: Commercial Managed Care - HMO | Source: Ambulatory Visit | Attending: Internal Medicine | Admitting: Internal Medicine

## 2015-01-01 ENCOUNTER — Ambulatory Visit (INDEPENDENT_AMBULATORY_CARE_PROVIDER_SITE_OTHER): Payer: Commercial Managed Care - HMO | Admitting: Internal Medicine

## 2015-01-01 ENCOUNTER — Encounter: Payer: Self-pay | Admitting: Internal Medicine

## 2015-01-01 ENCOUNTER — Telehealth: Payer: Self-pay | Admitting: Internal Medicine

## 2015-01-01 VITALS — BP 120/70 | HR 87 | Temp 98.2°F | Ht 70.0 in | Wt 224.0 lb

## 2015-01-01 DIAGNOSIS — J209 Acute bronchitis, unspecified: Secondary | ICD-10-CM

## 2015-01-01 DIAGNOSIS — R062 Wheezing: Secondary | ICD-10-CM

## 2015-01-01 DIAGNOSIS — R7302 Impaired glucose tolerance (oral): Secondary | ICD-10-CM

## 2015-01-01 HISTORY — DX: Wheezing: R06.2

## 2015-01-01 HISTORY — DX: Acute bronchitis, unspecified: J20.9

## 2015-01-01 MED ORDER — LEVOFLOXACIN 250 MG PO TABS: 250.0000 mg | ORAL_TABLET | Freq: Every day | ORAL | Status: DC

## 2015-01-01 MED ORDER — PREDNISONE 10 MG PO TABS: ORAL_TABLET | ORAL | Status: DC

## 2015-01-01 MED ORDER — HYDROCODONE-HOMATROPINE 5-1.5 MG/5ML PO SYRP: 5.0000 mL | ORAL_SOLUTION | Freq: Four times a day (QID) | ORAL | Status: DC | PRN

## 2015-01-01 NOTE — Telephone Encounter (Signed)
States two meds prescribed today went to wrong pharmacy should have [facility] to [facility] in [place] ph [phone]

## 2015-01-01 NOTE — Assessment & Plan Note (Addendum)
stable overall by history and exam, recent data reviewed with pt, and pt to continue medical treatment as before,  to f/u any worsening symptoms or concerns Lab Results  Component Value Date   HGBA1C 5.9 12/20/2013   Pt to call for onset cbg > 200 or polys with steroid tx

## 2015-01-01 NOTE — Assessment & Plan Note (Signed)
Mild to mod, for antibx course,  to f/u any worsening symptoms or concerns 

## 2015-01-01 NOTE — Progress Notes (Signed)
Pre visit review using our clinic review tool, if applicable. No additional management support is needed unless otherwise documented below in the visit note. 

## 2015-01-01 NOTE — Patient Instructions (Addendum)
Please take all new medication as prescribed - the antibiotic, cough medicine, and prednisone  You can also take Delsym OTC for cough, and/or Mucinex (or it's generic off brand) for congestion, and tylenol as needed for pain.  Please continue all other medications as before, and refills have been done if requested.  Please have the pharmacy call with any other refills you may need.  Please keep your appointments with your specialists as you may have planned  Please go to the XRAY Department in the Basement (go straight as you get off the elevator) for the x-ray testing  You will be contacted by phone if any changes need to be made immediately.  Otherwise, you will receive a letter about your results with an explanation, but please check with MyChart first.  Please remember to sign up for MyChart if you have not done so, as this will be important to you in the future with finding out test results, communicating by private email, and scheduling acute appointments online when needed.

## 2015-01-01 NOTE — Assessment & Plan Note (Signed)
/  Mild to mod, for predpac asd,  to f/u any worsening symptoms or concerns 

## 2015-01-01 NOTE — Progress Notes (Signed)
Subjective:    Patient ID: Albert Wade, male    DOB: 1946-07-24, 69 y.o.   [name]: [id number]  HPI  Here with acute onset mild to mod 2-3 days ST, HA, general weakness and malaise, with prod cough greenish sputum, but Pt denies chest pain, increased sob or doe, wheezing, orthopnea, PND, increased LE swelling, palpitations, dizziness or syncope., except for onset mild wheezing last PM, without worsening sob. Quit smoking approx 40 yrs ago.   Pt denies polydipsia, polyuria,  Past Medical History  Diagnosis Date  . CAD (coronary artery disease)   . Hypercholesteremia   . GERD (gastroesophageal reflux disease)   . Pneumonia   . Anxiety   . Chronic back pain   . Chronic bronchitis   . Depression   . BPH (benign prostatic hypertrophy)   . MI (myocardial infarction)     mid-'90's  . Sleep apnea     mild-no cpap, unable to tolerate  . Peripheral neuropathy     peripheral neuropathy  . H/O hiatal hernia   . Umbilical hernia   . Arthritis     back,wrists,hx. previous fractures  . Varicose veins    Past Surgical History  Procedure Laterality Date  . Appendectomy    . Angioplasty    . Cardiac catheterization      11'11  . Wrist surgery Right   . Back surgery      x3  . Circumcision N/A 10/21/2013    Procedure: CIRCUMCISION ADULT;  Surgeon: Bernestine Amass, MD;  Location: [facility];  Service: Urology;  Laterality: N/A;  . Cystoscopy N/A 10/21/2013    Procedure: CYSTOSCOPY FLEXIBLE;  Surgeon: Bernestine Amass, MD;  Location: [facility];  Service: Urology;  Laterality: N/A;    reports that he quit smoking about 19 years ago. He has never used smokeless tobacco. He reports that he does not drink alcohol or use illicit drugs. family history includes Emphysema in his father; Hyperlipidemia in his father and mother; Prostate cancer in his father. No Known Allergies Current Outpatient Prescriptions on File Prior to Visit  Medication Sig Dispense Refill  . aspirin 81 MG tablet Take 81 mg by mouth  daily.    . cyclobenzaprine (FLEXERIL) 10 MG tablet Take 10 mg by mouth 2 (two) times daily as needed for muscle spasms.     . furosemide (LASIX) 40 MG tablet Take1 tablet on day one(40 mg)Then take1 and1/2 tab on day two(60mg )and then take two tablets on daythree(80mg ).Then go back to day one 165 tablet 5  . irbesartan (AVAPRO) 300 MG tablet Take 1 tablet (300 mg total) by mouth daily. 90 tablet 3  . methadone (DOLOPHINE) 10 MG tablet Take 10-20 mg by mouth as directed. Takes 1-2 tablets 3 times and may an additional dose if needed for pain not to exceed 7 tablets in 24 hour period    . oxyCODONE-acetaminophen (PERCOCET) 10-325 MG per tablet Take 1 tablet by mouth every 4 (four) hours as needed for pain.     . potassium chloride (KLOR-CON 10) 10 MEQ tablet 2 tabs by mouth per day on days taking furosemide 180 tablet 3  . sertraline (ZOLOFT) 100 MG tablet Take 100 mg by mouth 2 (two) times daily.     . simvastatin (ZOCOR) 80 MG tablet Take 1 tablet (80 mg total) by mouth every evening. 90 tablet 3   No current facility-administered medications on file prior to visit.   Review of Systems  Constitutional: Negative for unusual diaphoresis  or other sweats  HENT: Negative for ringing in ear Eyes: Negative for double vision or worsening visual disturbance.  Respiratory: Negative for choking and stridor.   Gastrointestinal: Negative for vomiting or other signifcant bowel change Genitourinary: Negative for hematuria or decreased urine volume.  Musculoskeletal: Negative for other MSK pain or swelling Skin: Negative for color change and worsening wound.  Neurological: Negative for tremors and numbness other than noted  Psychiatric/Behavioral: Negative for decreased concentration or agitation other than above       Objective:   Physical Exam BP 120/70 mmHg  Pulse 87  Temp(Src) 98.2 F (36.8 C) (Oral)  Ht 5\' 10"  (1.778 m)  Wt 224 lb (101.606 kg)  BMI 32.14 kg/m2  SpO2 94% VS noted, mild  ill Constitutional: Pt appears well-developed, well-nourished.  HENT: Head: NCAT.  Right Ear: External ear normal.  Left Ear: External ear normal.  Bilat tm's with mild erythema.  Max sinus areas mild tender.  Pharynx with mild erythema, no exudate Eyes: . Pupils are equal, round, and reactive to light. Conjunctivae and EOM are normal Neck: Normal range of motion. Neck supple.  Cardiovascular: Normal rate and regular rhythm.   Pulmonary/Chest: Effort normal and breath sounds without rales but + mild bilat wheezing.  Neurological: Pt is alert. Not confused , motor grossly intact Skin: Skin is warm. No rash Psychiatric: Pt behavior is normal. No agitation.     Assessment & Plan:

## 2015-01-01 NOTE — Telephone Encounter (Signed)
Refills done to local as requested.  Called the patient to inform, but no answer and no voice mail to leave msg.

## 2015-01-19 ENCOUNTER — Encounter: Payer: Self-pay | Admitting: Internal Medicine

## 2015-01-19 ENCOUNTER — Other Ambulatory Visit: Payer: Self-pay | Admitting: *Deleted

## 2015-01-19 MED ORDER — PREDNISONE 10 MG PO TABS: ORAL_TABLET | ORAL | Status: DC

## 2015-02-25 ENCOUNTER — Ambulatory Visit: Payer: Commercial Managed Care - HMO | Admitting: Internal Medicine

## 2015-03-06 ENCOUNTER — Other Ambulatory Visit: Payer: Self-pay | Admitting: Internal Medicine

## 2015-03-16 ENCOUNTER — Telehealth: Payer: Self-pay | Admitting: *Deleted

## 2015-03-16 NOTE — Telephone Encounter (Signed)
During conversation with patient's wife about her symptoms, she reports that her husband has been c/o a fast heart rate. They have not checked this rate. She states he had an episode of chest pain.  Patient is in the background denying this.  Instructed that Albert Wade monitor his BP and HR with their home cuff, record the readings.  They are aware that am forwarding this information to Dr. Harrington Challenger for her review.

## 2015-05-04 ENCOUNTER — Telehealth: Payer: Self-pay | Admitting: Internal Medicine

## 2015-05-04 ENCOUNTER — Other Ambulatory Visit: Payer: Self-pay | Admitting: Internal Medicine

## 2015-05-04 NOTE — Telephone Encounter (Signed)
Forwarding to Dr. Harrington Challenger for recommendation.

## 2015-05-04 NOTE — Telephone Encounter (Signed)
Pt c/o medication issue: 1. Name of Medication: Xanax 2. How are you currently taking this medication (dosage and times per day)? Currently not taking  3. Are you having a reaction (difficulty breathing--STAT)?  No  4. What is your medication issue? Needs medication to help him relax and rest. Not sleeping at night. Pain management Dr. Burnard Bunting that they call Dr. Harrington Challenger being that he is a heart patient.

## 2015-05-05 NOTE — Telephone Encounter (Signed)
Dr Jenny Reichmann who is his PCP should be prescribing this.

## 2015-05-05 NOTE — Telephone Encounter (Signed)
Spoke with patient's wife. She states the pain management doctor started him on Seroquel for depression/unable to sleep. It made him itchy and wound up all the time and could never sleep. Per Mrs. Hobby the doctor advised him on how to stop the medication safely. He has been off of it for 16 days.  Advised to make an appointment with PCP.

## 2015-05-05 NOTE — Telephone Encounter (Signed)
Follow up      Returning a nurses call.  Please call hm first [phone] then cell [phone]

## 2015-05-05 NOTE — Telephone Encounter (Signed)
Called patient at home number. Was unable to leave voice mail--just rang a long time then continuous beeps.     **PER FYI--OK TO LEAVE DETAILED MESSAGE ON ANSWERING MACHINE**

## 2015-05-12 ENCOUNTER — Encounter: Payer: Self-pay | Admitting: Internal Medicine

## 2015-05-12 ENCOUNTER — Ambulatory Visit (INDEPENDENT_AMBULATORY_CARE_PROVIDER_SITE_OTHER): Payer: Commercial Managed Care - HMO | Admitting: Internal Medicine

## 2015-05-12 VITALS — BP 142/90 | HR 65 | Temp 98.4°F | Wt 204.1 lb

## 2015-05-12 DIAGNOSIS — F329 Major depressive disorder, single episode, unspecified: Secondary | ICD-10-CM

## 2015-05-12 DIAGNOSIS — R7302 Impaired glucose tolerance (oral): Secondary | ICD-10-CM

## 2015-05-12 DIAGNOSIS — N644 Mastodynia: Secondary | ICD-10-CM

## 2015-05-12 DIAGNOSIS — Z23 Encounter for immunization: Secondary | ICD-10-CM

## 2015-05-12 DIAGNOSIS — Z0000 Encounter for general adult medical examination without abnormal findings: Secondary | ICD-10-CM

## 2015-05-12 DIAGNOSIS — N62 Hypertrophy of breast: Secondary | ICD-10-CM | POA: Insufficient documentation

## 2015-05-12 DIAGNOSIS — F32A Depression, unspecified: Secondary | ICD-10-CM

## 2015-05-12 HISTORY — DX: Hypertrophy of breast: N62

## 2015-05-12 MED ORDER — HYDROCODONE-HOMATROPINE 5-1.5 MG/5ML PO SYRP: 5.0000 mL | ORAL_SOLUTION | Freq: Four times a day (QID) | ORAL | Status: DC | PRN

## 2015-05-12 MED ORDER — CITALOPRAM HYDROBROMIDE 10 MG PO TABS: 10.0000 mg | ORAL_TABLET | Freq: Every day | ORAL | Status: DC

## 2015-05-12 NOTE — Patient Instructions (Addendum)
You had the Pneumovax pneumonia shot today  Please take all new medication as prescribed - the citalopram 10 mg per day  Please call in 4 wks if not much improved, as we could increase the dose to 20 mg  Please continue all other medications as before, and refills have been done if requested.  Please have the pharmacy call with any other refills you may need.  Please continue your efforts at being more active, low cholesterol diet, and weight control.  You are otherwise up to date with prevention measures today.  Please keep your appointments with your specialists as you may have planned  You will be contacted regarding the referral for: mammogram  Please go to the LAB in the Basement (turn left off the elevator) for the tests to be done today  You will be contacted by phone if any changes need to be made immediately.  Otherwise, you will receive a letter about your results with an explanation, but please check with MyChart first.  Please remember to sign up for MyChart if you have not done so, as this will be important to you in the future with finding out test results, communicating by private email, and scheduling acute appointments online when needed.

## 2015-05-12 NOTE — Progress Notes (Signed)
Subjective:    Patient ID: Albert Wade, male    DOB: Mar 18, 1946, 69 y.o.   [name]: [id number]  HPI  Here for wellness and f/u;  Overall doing ok;  Pt denies Chest pain, worsening SOB, DOE, wheezing, orthopnea, PND, worsening LE edema, palpitations, dizziness or syncope.  Pt denies neurological change such as new headache, facial or extremity weakness.  Pt denies polydipsia, polyuria, or low sugar symptoms. Pt states overall good compliance with treatment and medications, good tolerability, and has been trying to follow appropriate diet.  Pt has had 1 mo mild worsening depressive symptoms, but no suicidal ideation or panic. No fever, night sweats, wt loss, loss of appetite, or other constitutional symptoms.  Pt states good ability with ADL's, has low fall risk, home safety reviewed and adequate, no other significant changes in hearing or vision, and only occasionally active with exercise.  Also c/o worsening sudden onset x 1 mo or less of bilat breast enlarging with tenderness and ? Of knots to left side, no nipple d/c or overlying skin change. Past Medical History  Diagnosis Date  . CAD (coronary artery disease)   . Hypercholesteremia   . GERD (gastroesophageal reflux disease)   . Pneumonia   . Anxiety   . Chronic back pain   . Chronic bronchitis   . Depression   . BPH (benign prostatic hypertrophy)   . MI (myocardial infarction)     mid-'90's  . Sleep apnea     mild-no cpap, unable to tolerate  . Peripheral neuropathy     peripheral neuropathy  . H/O hiatal hernia   . Umbilical hernia   . Arthritis     back,wrists,hx. previous fractures  . Varicose veins    Past Surgical History  Procedure Laterality Date  . Appendectomy    . Angioplasty    . Cardiac catheterization      11'11  . Wrist surgery Right   . Back surgery      x3  . Circumcision N/A 10/21/2013    Procedure: CIRCUMCISION ADULT;  Surgeon: Bernestine Amass, MD;  Location: [facility];  Service: Urology;  Laterality: N/A;    . Cystoscopy N/A 10/21/2013    Procedure: CYSTOSCOPY FLEXIBLE;  Surgeon: Bernestine Amass, MD;  Location: [facility];  Service: Urology;  Laterality: N/A;    reports that he quit smoking about 19 years ago. He has never used smokeless tobacco. He reports that he does not drink alcohol or use illicit drugs. family history includes Emphysema in his father; Hyperlipidemia in his father and mother; Prostate cancer in his father. No Known Allergies Current Outpatient Prescriptions on File Prior to Visit  Medication Sig Dispense Refill  . aspirin 81 MG tablet Take 81 mg by mouth daily.    . cyclobenzaprine (FLEXERIL) 10 MG tablet Take 10 mg by mouth 2 (two) times daily as needed for muscle spasms.     . furosemide (LASIX) 40 MG tablet Take1 tablet on day one(40 mg)Then take1 and1/2 tab on day two(60mg )and then take two tablets on daythree(80mg ).Then go back to day one 165 tablet 5  . methadone (DOLOPHINE) 10 MG tablet Take 10-20 mg by mouth as directed. Takes 1-2 tablets 3 times and may an additional dose if needed for pain not to exceed 7 tablets in 24 hour period    . oxyCODONE-acetaminophen (PERCOCET) 10-325 MG per tablet Take 1 tablet by mouth every 4 (four) hours as needed for pain.     . potassium chloride (KLOR-CON  10) 10 MEQ tablet 2 tabs by mouth per day on days taking furosemide 180 tablet 3  . simvastatin (ZOCOR) 80 MG tablet TAKE ONE TABLET EVERY EVENING 90 tablet 3  . topiramate (TOPAMAX) 50 MG tablet Take 50 mg by mouth 3 (three) times daily.    . irbesartan (AVAPRO) 300 MG tablet Take 1 tablet (300 mg total) by mouth daily. (Patient not taking: Reported on 05/12/2015) 90 tablet 3  . irbesartan (AVAPRO) 300 MG tablet TAKE ONE (1) TABLET EACH DAY (Patient not taking: Reported on 05/12/2015) 90 tablet 0  . levofloxacin (LEVAQUIN) 250 MG tablet Take 1 tablet (250 mg total) by mouth daily. (Patient not taking: Reported on 05/12/2015) 10 tablet 0  . predniSONE (DELTASONE) 10 MG tablet 3 tabs by mouth  per day for 3 days,2tabs per day for 3 days,1tab per day for 3 days (Patient not taking: Reported on 05/12/2015) 18 tablet 0  . predniSONE (DELTASONE) 10 MG tablet Take five tablets x 3 days; take four tablets x 5 days; three tablets x 5 days;two tablets x 5 days;one tablet x 5 days (Patient not taking: Reported on 05/12/2015) 65 tablet 0  . sertraline (ZOLOFT) 100 MG tablet Take 100 mg by mouth 2 (two) times daily.     . simvastatin (ZOCOR) 80 MG tablet Take 1 tablet (80 mg total) by mouth every evening. (Patient not taking: Reported on 05/12/2015) 90 tablet 3   No current facility-administered medications on file prior to visit.   Review of Systems Constitutional: Negative for increased diaphoresis, other activity, appetite or siginficant weight change other than noted HENT: Negative for worsening hearing loss, ear pain, facial swelling, mouth sores and neck stiffness.   Eyes: Negative for other worsening pain, redness or visual disturbance.  Respiratory: Negative for shortness of breath and wheezing  Cardiovascular: Negative for chest pain and palpitations.  Gastrointestinal: Negative for diarrhea, blood in stool, abdominal distention or other pain Genitourinary: Negative for hematuria, flank pain or change in urine volume.  Musculoskeletal: Negative for myalgias or other joint complaints.  Skin: Negative for color change and wound or drainage.  Neurological: Negative for syncope and numbness. other than noted Hematological: Negative for adenopathy. or other swelling Psychiatric/Behavioral: Negative for hallucinations, SI, self-injury, decreased concentration or other worsening agitation.      Objective:   Physical Exam BP 142/90 mmHg  Pulse 65  Temp(Src) 98.4 F (36.9 C) (Oral)  Wt 204 lb 1.3 oz (92.57 kg)  SpO2 97% VS noted,  Constitutional: Pt is oriented to person, place, and time. Appears well-developed and well-nourished, in no significant distress Head: Normocephalic and  atraumatic.  Right Ear: External ear normal.  Left Ear: External ear normal.  Nose: Nose normal.  Mouth/Throat: Oropharynx is clear and moist.  Eyes: Conjunctivae and EOM are normal. Pupils are equal, round, and reactive to light.  Neck: Normal range of motion. Neck supple. No JVD present. No tracheal deviation present or significant neck LA or mass Breasts: + bilat mild enlargement with bilat tenderness, ? Small pea sized mass left lateral aspect, no overlying skin change, no nipple d/c Cardiovascular: Normal rate, regular rhythm, normal heart sounds and intact distal pulses.   Pulmonary/Chest: Effort normal and breath sounds without rales or wheezing  Abdominal: Soft. Bowel sounds are normal. NT. No HSM  Musculoskeletal: Normal range of motion. Exhibits no edema.  Lymphadenopathy:  Has no cervical adenopathy.  Neurological: Pt is alert and oriented to person, place, and time. Pt has normal reflexes. No cranial  nerve deficit. Motor grossly intact Skin: Skin is warm and dry. No rash noted.  Psychiatric:  Has depressed mood and affect. Behavior is normal.     Assessment & Plan:

## 2015-05-12 NOTE — Assessment & Plan Note (Signed)

## 2015-05-12 NOTE — Assessment & Plan Note (Signed)
verified no Si or HI, for celexa 10 qd, consdier incr to 20 qd at 4 wks, declines counseling referral

## 2015-05-12 NOTE — Progress Notes (Signed)
Pre visit review using our clinic review tool, if applicable. No additional management support is needed unless otherwise documented below in the visit note. 

## 2015-05-12 NOTE — Assessment & Plan Note (Signed)
New onset, for diag MM given ? Of small mass left lateral aspect, but also for testosterone, LH, estradiol and prolactin levels, consider endo consult

## 2015-05-12 NOTE — Assessment & Plan Note (Signed)
Asympt, for a1c, cont efforts at wt loss, diet, excercise

## 2015-05-13 ENCOUNTER — Telehealth: Payer: Self-pay | Admitting: Internal Medicine

## 2015-05-13 MED ORDER — TRAMADOL HCL 50 MG PO TABS: 50.0000 mg | ORAL_TABLET | Freq: Four times a day (QID) | ORAL | Status: DC | PRN

## 2015-05-13 NOTE — Telephone Encounter (Signed)
Pt and spouse advised of same. Rx faxed to pharmacy

## 2015-05-13 NOTE — Telephone Encounter (Signed)
Pt is requesting Rx for back pain he says he discussed with MD at last OV, please advise

## 2015-05-13 NOTE — Telephone Encounter (Signed)
[place] for tramadol, but I do not rx higher strength pain medications for chronic pain, as I no longer practice chronic pain medicine  Done hardcopy to [facility]  I could refer to Pain management if needed

## 2015-05-13 NOTE — Telephone Encounter (Signed)
Pt called stated that he was on this med : cyclobenzaprine (FLEXERIL) 10 MG before and he was taken off, Dr. Jenny Reichmann might need to change to something else. Please call pt

## 2015-05-27 NOTE — Telephone Encounter (Signed)
Patient called in wanting to let Dr. Jenny Reichmann know that the Tramadol is helping some, but not 100%. Patient is aware is out till next week.

## 2015-06-01 ENCOUNTER — Encounter: Payer: Self-pay | Admitting: Internal Medicine

## 2015-06-01 ENCOUNTER — Ambulatory Visit (INDEPENDENT_AMBULATORY_CARE_PROVIDER_SITE_OTHER): Payer: Commercial Managed Care - HMO | Admitting: Internal Medicine

## 2015-06-01 DIAGNOSIS — E785 Hyperlipidemia, unspecified: Secondary | ICD-10-CM | POA: Diagnosis not present

## 2015-06-01 LAB — CBC
HCT: 39.5 % (ref 39.0–52.0)
Hemoglobin: 13.1 g/dL (ref 13.0–17.0)
MCHC: 33.2 g/dL (ref 30.0–36.0)
MCV: 90.2 fl (ref 78.0–100.0)
Platelets: 232 10*3/uL (ref 150.0–400.0)
RBC: 4.38 Mil/uL (ref 4.22–5.81)
RDW: 13.3 % (ref 11.5–15.5)
WBC: 8.1 10*3/uL (ref 4.0–10.5)

## 2015-06-01 LAB — LIPID PANEL
Cholesterol: 95 mg/dL (ref 0–200)
HDL: 37.3 mg/dL — ABNORMAL LOW
LDL Cholesterol: 41 mg/dL (ref 0–99)
NonHDL: 57.7
Total CHOL/HDL Ratio: 3
Triglycerides: 82 mg/dL (ref 0.0–149.0)
VLDL: 16.4 mg/dL (ref 0.0–40.0)

## 2015-06-01 LAB — BASIC METABOLIC PANEL WITH GFR
BUN: 9 mg/dL (ref 6–23)
CO2: 27 meq/L (ref 19–32)
Calcium: 9 mg/dL (ref 8.4–10.5)
Chloride: 108 meq/L (ref 96–112)
Creatinine, Ser: 0.88 mg/dL (ref 0.40–1.50)
GFR: 91.19 mL/min
Glucose, Bld: 110 mg/dL — ABNORMAL HIGH (ref 70–99)
Potassium: 4.5 meq/L (ref 3.5–5.1)
Sodium: 138 meq/L (ref 135–145)

## 2015-06-01 LAB — AST: AST: 14 U/L (ref 0–37)

## 2015-06-01 NOTE — Progress Notes (Signed)
Cardiology Office Note   Date:  06/01/2015   ID:  Law, Corsino 01-19-1946, [name] [id number]  PCP:  Cathlean Cower, MD  Cardiologist:   Dorris Carnes, MD   Chief Complaint  Patient presents with  . Follow-up    hyperlipidemia      History of Present Illness: Albert Wade is a 69 y.o. male with a history of CAD (mild by cath in 10/2010), COPD, dyslipidemia and hyptertension. I saw him in November Since seen he denies CP  Active  [place].  Does have problesm with allergies Has been depressed  Just started Celexa  May be helping Denies wheezing      Current Outpatient Prescriptions  Medication Sig Dispense Refill  . aspirin 81 MG tablet Take 81 mg by mouth daily.    . citalopram (CELEXA) 10 MG tablet Take 1 tablet (10 mg total) by mouth daily. 90 tablet 3  . furosemide (LASIX) 40 MG tablet Take1 tablet on day one(40 mg)Then take1 and1/2 tab on day two(60mg )and then take two tablets on daythree(80mg ).Then go back to day one 165 tablet 5  . HYDROcodone-homatropine (HYCODAN) 5-1.5 MG/5ML syrup Take 5 mLs by mouth every 6 (six) hours as needed for cough. 180 mL 0  . irbesartan (AVAPRO) 300 MG tablet Take 1 tablet (300 mg total) by mouth daily. 90 tablet 3  . methadone (DOLOPHINE) 10 MG tablet Take 10-20 mg by mouth as directed. Takes 1-2 tablets 3 times and may an additional dose if needed for pain not to exceed 7 tablets in 24 hour period    . oxyCODONE-acetaminophen (PERCOCET) 10-325 MG per tablet Take 1 tablet by mouth every 4 (four) hours as needed for pain.     . potassium chloride (KLOR-CON 10) 10 MEQ tablet 2 tabs by mouth per day on days taking furosemide 180 tablet 3  . simvastatin (ZOCOR) 80 MG tablet TAKE ONE TABLET EVERY EVENING 90 tablet 3  . topiramate (TOPAMAX) 50 MG tablet Take 50 mg by mouth 3 (three) times daily.    . traMADol (ULTRAM) 50 MG tablet Take 1 tablet (50 mg total) by mouth every 6 (six) hours as needed. 120 tablet 2   No current  facility-administered medications for this visit.    Allergies:   Review of patient's allergies indicates no known allergies.   Past Medical History  Diagnosis Date  . CAD (coronary artery disease)   . Hypercholesteremia   . GERD (gastroesophageal reflux disease)   . Pneumonia   . Anxiety   . Chronic back pain   . Chronic bronchitis   . Depression   . BPH (benign prostatic hypertrophy)   . MI (myocardial infarction)     mid-'90's  . Sleep apnea     mild-no cpap, unable to tolerate  . Peripheral neuropathy     peripheral neuropathy  . H/O hiatal hernia   . Umbilical hernia   . Arthritis     back,wrists,hx. previous fractures  . Varicose veins     Past Surgical History  Procedure Laterality Date  . Appendectomy    . Angioplasty    . Cardiac catheterization      11'11  . Wrist surgery Right   . Back surgery      x3  . Circumcision N/A 10/21/2013    Procedure: CIRCUMCISION ADULT;  Surgeon: Bernestine Amass, MD;  Location: [facility];  Service: Urology;  Laterality: N/A;  . Cystoscopy N/A 10/21/2013    Procedure: CYSTOSCOPY FLEXIBLE;  Surgeon: Bernestine Amass, MD;  Location: [facility];  Service: Urology;  Laterality: N/A;     Social History:  The patient  reports that he quit smoking about 19 years ago. He has never used smokeless tobacco. He reports that he does not drink alcohol or use illicit drugs.   Family History:  The patient's family history includes Emphysema in his father; Hyperlipidemia in his father and mother; Prostate cancer in his father.    ROS:  Please see the history of present illness. All other systems are reviewed and  Negative to the above problem except as noted.    PHYSICAL EXAM: VS:  BP 110/58 mmHg  Pulse 55  Ht 5\' 10"  (1.778 m)  Wt 199 lb 9.6 oz (90.538 kg)  BMI 28.64 kg/m2  SpO2 93%  GEN: Well nourished, well developed, in no acute distress HEENT: normal Neck: no JVD, carotid bruits, or masses Cardiac: RRR; no murmurs, rubs, or gallops,no  edema  Respiratory:  clear to auscultation bilaterally, SOme decreased airflow   GI: soft, nontender, nondistended, + BS  No hepatomegaly  MS: no deformity Moving all extremities   Skin: warm and dry, no rash Neuro:  Strength and sensation are intact Psych: euthymic mood, full affect   EKG:  EKG is not  ordered today.   Lipid Panel    Component Value Date/Time   CHOL 147 10/07/2013 1143   TRIG 193.0* 10/07/2013 1143   HDL 36.90* 10/07/2013 1143   CHOLHDL 4 10/07/2013 1143   VLDL 38.6 10/07/2013 1143   LDLCALC 72 10/07/2013 1143   LDLDIRECT 76.6 03/21/2007 0905      Wt Readings from Last 3 Encounters:  06/01/15 199 lb 9.6 oz (90.538 kg)  05/12/15 204 lb 1.3 oz (92.57 kg)  01/01/15 224 lb (101.606 kg)      ASSESSMENT AND PLAN:  1  CAD  Mild by cath  No CP or anginal symptoms   2.  HL  Check lipids today  3.  HTN  BP is great  Check labs  4.  COPD  Could try flonase  Nasal washes  5.  Depression  COntinue celexa  6.  [place]  Patine has inguinal hernia  Considering going for surg evaluation  From a cardiac standpoint I think he would be at low risk for surgery  Will need to have pulm optimized prior.    Disposition:   F/U in Jan    Signed, Carriann Hesse, MD  06/01/2015 9:57 AM    [facility] [address], [place], [place]  [address] Phone: [phone]; Fax: [phone]

## 2015-06-01 NOTE — Patient Instructions (Signed)
Medication Instructions:  Your physician recommends that you continue on your current medications as directed. Please refer to the Current Medication list given to you today.  Labwork: Your physician recommends that you have lab work today: CBC, BMP, AST and LIPID  Testing/Procedures: No new orders.   Follow-Up: Your physician wants you to follow-up in: January 2017 with Dr Harrington Challenger.  You will receive a reminder letter in the mail two months in advance. If you don't receive a letter, please call our office to schedule the follow-up appointment.  Any Other Special Instructions Will Be Listed Below (If Applicable).

## 2015-06-05 ENCOUNTER — Telehealth: Payer: Self-pay | Admitting: Internal Medicine

## 2015-06-05 NOTE — Telephone Encounter (Signed)
i think ok to cont same dose for now, thanks

## 2015-06-05 NOTE — Telephone Encounter (Signed)
Patient was prescribed traMADol (ULTRAM) 50 MG tablet [[id number] and was to call you with how it is working. Patient claims it is working ok, but thought maybe a stronger dose would work better. Pharmacy is [facility].

## 2015-06-11 ENCOUNTER — Other Ambulatory Visit: Payer: Self-pay | Admitting: Internal Medicine

## 2015-06-18 ENCOUNTER — Telehealth: Payer: Self-pay | Admitting: Internal Medicine

## 2015-06-18 NOTE — Telephone Encounter (Signed)
Pt wife called in and is requesting for a refill on his traMADol (ULTRAM) 50 MG tablet [[id number]] and would like a stronger dose, he is not resting at night    [facility]  [phone]

## 2015-06-18 NOTE — Telephone Encounter (Signed)
Pt wife inform. appt made to review other options.

## 2015-06-18 NOTE — Telephone Encounter (Signed)
I would not feel comfortable wit ongoing refills for this as pt is also on methadone and also had percocet listed on his med list as well

## 2015-06-24 NOTE — Telephone Encounter (Signed)
Patient's wife call backing regarding this. See note below: The medicine was supposed to be for the citalopram (CELEXA) 10 MG tablet [[id number], not for tramadol. She is still wondering if he can up the dose on this, also will he still need to come in to see him tomorrow. Please call wife

## 2015-06-24 NOTE — Telephone Encounter (Signed)
OK to see in office tomorrow, thanks

## 2015-06-25 ENCOUNTER — Ambulatory Visit (INDEPENDENT_AMBULATORY_CARE_PROVIDER_SITE_OTHER): Payer: Commercial Managed Care - HMO | Admitting: Internal Medicine

## 2015-06-25 ENCOUNTER — Encounter: Payer: Self-pay | Admitting: Internal Medicine

## 2015-06-25 VITALS — BP 132/70 | HR 57 | Temp 97.7°F | Ht 70.0 in | Wt 199.0 lb

## 2015-06-25 DIAGNOSIS — F329 Major depressive disorder, single episode, unspecified: Secondary | ICD-10-CM | POA: Diagnosis not present

## 2015-06-25 DIAGNOSIS — F32A Depression, unspecified: Secondary | ICD-10-CM

## 2015-06-25 DIAGNOSIS — R7302 Impaired glucose tolerance (oral): Secondary | ICD-10-CM | POA: Diagnosis not present

## 2015-06-25 DIAGNOSIS — F419 Anxiety disorder, unspecified: Secondary | ICD-10-CM | POA: Diagnosis not present

## 2015-06-25 MED ORDER — CITALOPRAM HYDROBROMIDE 20 MG PO TABS: 20.0000 mg | ORAL_TABLET | Freq: Every day | ORAL | Status: DC

## 2015-06-25 MED ORDER — ZOLPIDEM TARTRATE 5 MG PO TABS: 5.0000 mg | ORAL_TABLET | Freq: Every evening | ORAL | Status: DC | PRN

## 2015-06-25 NOTE — Patient Instructions (Signed)
Ok to increase the celexa to 20 mg per day  Please take all new medication as prescribed - the [place] as needed for sleep  Please continue all other medications as before, and refills have been done if requested.  Please have the pharmacy call with any other refills you may need.  Please continue your efforts at being more active, low cholesterol diet, and weight control.  Please keep your appointments with your specialists as you may have planned

## 2015-06-25 NOTE — Assessment & Plan Note (Signed)
stable overall by history and exam, recent data reviewed with pt, and pt to continue medical treatment as before,  to f/u any worsening symptoms or concerns Lab Results  Component Value Date   WBC 8.1 06/01/2015   HGB 13.1 06/01/2015   HCT 39.5 06/01/2015   PLT 232.0 06/01/2015   GLUCOSE 110* 06/01/2015   CHOL 95 06/01/2015   TRIG 82.0 06/01/2015   HDL 37.30* 06/01/2015   LDLDIRECT 76.6 03/21/2007   LDLCALC 41 06/01/2015   ALT 17 10/07/2013   AST 14 06/01/2015   NA 138 06/01/2015   K 4.5 06/01/2015   CL 108 06/01/2015   CREATININE 0.88 06/01/2015   BUN 9 06/01/2015   CO2 27 06/01/2015   TSH 4.97 10/08/2010   HGBA1C 5.9 12/20/2013

## 2015-06-25 NOTE — Assessment & Plan Note (Signed)
Mild worsening, verified no SI or HI, declines counseling referral, to increase the celexa to 20 qd,  to f/u any worsening symptoms or concerns

## 2015-06-25 NOTE — Assessment & Plan Note (Signed)
stable overall by history and exam, recent data reviewed with pt, and pt to continue medical treatment as before,  to f/u any worsening symptoms or concerns Lab Results  Component Value Date   HGBA1C 5.9 12/20/2013

## 2015-06-25 NOTE — Progress Notes (Signed)
Subjective:    Patient ID: Albert Wade, male    DOB: 06-Dec-1946, 70 y.o.   [name]: [id number]  HPI  Here to f/u, Pt continues to have recurring LBP without change in severity, bowel or bladder change, fever, wt loss,  worsening LE pain/numbness/weakness, gait change or falls, but cant go back to work,  Has really become limited due to back pain, cant sleep well at night, more stress recently with grandkids.  Has had mild worsening depressive symptoms, but no suicidal ideation, or panic; has ongoing anxiety. Going stir crazy being stuck in the house. Pt denies chest pain, increased sob or doe, wheezing, orthopnea, PND, increased LE swelling, palpitations, dizziness or syncope. Pt denies new neurological symptoms such as new headache, or facial or extremity weakness or numbness   Pt denies polydipsia, polyuria   Past Medical History  Diagnosis Date  . CAD (coronary artery disease)   . Hypercholesteremia   . GERD (gastroesophageal reflux disease)   . Pneumonia   . Anxiety   . Chronic back pain   . Chronic bronchitis   . Depression   . BPH (benign prostatic hypertrophy)   . MI (myocardial infarction)     mid-'90's  . Sleep apnea     mild-no cpap, unable to tolerate  . Peripheral neuropathy     peripheral neuropathy  . H/O hiatal hernia   . Umbilical hernia   . Arthritis     back,wrists,hx. previous fractures  . Varicose veins    Past Surgical History  Procedure Laterality Date  . Appendectomy    . Angioplasty    . Cardiac catheterization      11'11  . Wrist surgery Right   . Back surgery      x3  . Circumcision N/A 10/21/2013    Procedure: CIRCUMCISION ADULT;  Surgeon: Bernestine Amass, MD;  Location: [facility];  Service: Urology;  Laterality: N/A;  . Cystoscopy N/A 10/21/2013    Procedure: CYSTOSCOPY FLEXIBLE;  Surgeon: Bernestine Amass, MD;  Location: [facility];  Service: Urology;  Laterality: N/A;    reports that he quit smoking about 19 years ago. He has never used smokeless  tobacco. He reports that he does not drink alcohol or use illicit drugs. family history includes Emphysema in his father; Hyperlipidemia in his father and mother; Prostate cancer in his father. Allergies  Allergen Reactions  . Seroquel [Quetiapine] Hives   Current Outpatient Prescriptions on File Prior to Visit  Medication Sig Dispense Refill  . aspirin 81 MG tablet Take 81 mg by mouth daily.    . citalopram (CELEXA) 10 MG tablet Take 1 tablet (10 mg total) by mouth daily. 90 tablet 3  . furosemide (LASIX) 40 MG tablet Take1 tablet on day one(40 mg)Then take1 and1/2 tab on day two(60mg )and then take two tablets on daythree(80mg ).Then go back to day one 165 tablet 5  . HYDROcodone-homatropine (HYCODAN) 5-1.5 MG/5ML syrup Take 5 mLs by mouth every 6 (six) hours as needed for cough. 180 mL 0  . irbesartan (AVAPRO) 300 MG tablet Take 1 tablet (300 mg total) by mouth daily. 90 tablet 3  . irbesartan (AVAPRO) 300 MG tablet TAKE ONE (1) TABLET EACH DAY 90 tablet 3  . methadone (DOLOPHINE) 10 MG tablet Take 10 mg by mouth 2 (two) times daily.    Marland Kitchen oxyCODONE-acetaminophen (PERCOCET) 10-325 MG per tablet Take 1 tablet by mouth every 4 (four) hours as needed for pain.     . potassium chloride (KLOR-CON  10) 10 MEQ tablet 2 tabs by mouth per day on days taking furosemide 180 tablet 3  . simvastatin (ZOCOR) 80 MG tablet TAKE ONE TABLET EVERY EVENING 90 tablet 3  . topiramate (TOPAMAX) 50 MG tablet Take 50 mg by mouth 3 (three) times daily.     No current facility-administered medications on file prior to visit.    Review of Systems  Constitutional: Negative for unusual diaphoresis or night sweats HENT: Negative for ringing in ear or discharge Eyes: Negative for double vision or worsening visual disturbance.  Respiratory: Negative for choking and stridor.   Gastrointestinal: Negative for vomiting or other signifcant bowel change Genitourinary: Negative for hematuria or change in urine volume.    Musculoskeletal: Negative for other MSK pain or swelling Skin: Negative for color change and worsening wound.  Neurological: Negative for tremors and numbness other than noted  Psychiatric/Behavioral: Negative for decreased concentration or agitation other than above       Objective:   Physical Exam BP 132/70 mmHg  Pulse 57  Temp(Src) 97.7 F (36.5 C) (Oral)  Ht 5\' 10"  (1.778 m)  Wt 199 lb (90.266 kg)  BMI 28.55 kg/m2  SpO2 97% VS noted,  Constitutional: Pt appears in no significant distress HENT: Head: NCAT.  Right Ear: External ear normal.  Left Ear: External ear normal.  Eyes: . Pupils are equal, round, and reactive to light. Conjunctivae and EOM are normal Neck: Normal range of motion. Neck supple.  Cardiovascular: Normal rate and regular rhythm.   Pulmonary/Chest: Effort normal and breath sounds without rales or wheezing.  Abd:  Soft, NT, ND, + BS Neurological: Pt is alert. Not confused , motor grossly intact Skin: Skin is warm. No rash, no LE edema Psychiatric: Pt behavior is normal. No agitation.      Assessment & Plan:

## 2015-06-25 NOTE — Progress Notes (Signed)
Pre visit review using our clinic review tool, if applicable. No additional management support is needed unless otherwise documented below in the visit note. 

## 2015-07-17 ENCOUNTER — Other Ambulatory Visit: Payer: Self-pay | Admitting: Internal Medicine

## 2015-07-17 DIAGNOSIS — N644 Mastodynia: Secondary | ICD-10-CM

## 2015-07-22 ENCOUNTER — Other Ambulatory Visit: Payer: Self-pay | Admitting: Internal Medicine

## 2015-07-22 ENCOUNTER — Ambulatory Visit
Admission: RE | Admit: 2015-07-22 | Discharge: 2015-07-22 | Disposition: A | Discharge status: Home / Self Care | Payer: Commercial Managed Care - HMO | Source: Ambulatory Visit | Attending: Internal Medicine | Admitting: Internal Medicine

## 2015-07-22 ENCOUNTER — Telehealth: Payer: Self-pay | Admitting: Internal Medicine

## 2015-07-22 DIAGNOSIS — N644 Mastodynia: Secondary | ICD-10-CM

## 2015-07-22 NOTE — Telephone Encounter (Signed)
Please consider OV as this appears to be a new problem, and I would not know which hernia it is, or how to best proceed

## 2015-07-22 NOTE — Telephone Encounter (Signed)
Patients hernia is hurting pretty bad and is needing to get to a hernia doctor asap regarding this.  Please advise patient [phone]

## 2015-07-22 NOTE — Telephone Encounter (Signed)
Patient's wife said the patient is in a lot of pain and would like to see a hernia doctor if possible. Please advise, thanks.

## 2015-07-23 NOTE — Telephone Encounter (Signed)
Pt aware  Will call back to schedule

## 2015-09-16 ENCOUNTER — Other Ambulatory Visit: Payer: Self-pay

## 2015-09-16 MED ORDER — ZOLPIDEM TARTRATE 5 MG PO TABS: 5.0000 mg | ORAL_TABLET | Freq: Every evening | ORAL | Status: DC | PRN

## 2015-09-16 NOTE — Telephone Encounter (Signed)
Rx faxed to pharmacy  

## 2015-09-16 NOTE — Telephone Encounter (Signed)
Done hardcopy to Dahlia  

## 2015-12-08 ENCOUNTER — Other Ambulatory Visit: Payer: Self-pay | Admitting: Internal Medicine

## 2015-12-08 NOTE — Telephone Encounter (Signed)
Please advise in PCP's absence, thanks! 

## 2015-12-08 NOTE — Telephone Encounter (Signed)
Rx faxed to pharmacy  

## 2016-01-04 ENCOUNTER — Ambulatory Visit: Payer: Commercial Managed Care - HMO | Admitting: Internal Medicine

## 2016-01-06 ENCOUNTER — Telehealth: Payer: Self-pay | Admitting: *Deleted

## 2016-01-06 MED ORDER — ZOLPIDEM TARTRATE 5 MG PO TABS: 5.0000 mg | ORAL_TABLET | Freq: Every evening | ORAL | Status: DC | PRN

## 2016-01-06 NOTE — Telephone Encounter (Signed)
Wife left msg on triage stating husband is needing refill on his Lorrin Mais would like [facility] day [facility]...Johny Chess

## 2016-01-06 NOTE — Telephone Encounter (Signed)
Done hardcopy to Dahlia  

## 2016-01-07 NOTE — Telephone Encounter (Signed)
Notified pt wife rx has been fax to pharmacy...Albert Wade

## 2016-02-19 ENCOUNTER — Ambulatory Visit: Payer: Self-pay | Admitting: Internal Medicine

## 2016-03-04 ENCOUNTER — Ambulatory Visit (INDEPENDENT_AMBULATORY_CARE_PROVIDER_SITE_OTHER): Payer: Medicare HMO | Admitting: Internal Medicine

## 2016-03-04 ENCOUNTER — Encounter: Payer: Self-pay | Admitting: Internal Medicine

## 2016-03-04 VITALS — BP 163/66 | HR 56 | Ht 70.0 in | Wt 200.0 lb

## 2016-03-04 DIAGNOSIS — R0789 Other chest pain: Secondary | ICD-10-CM

## 2016-03-04 DIAGNOSIS — R0602 Shortness of breath: Secondary | ICD-10-CM

## 2016-03-04 LAB — CBC
HCT: 41.7 % (ref 39.0–52.0)
Hemoglobin: 13.6 g/dL (ref 13.0–17.0)
MCH: 29.6 pg (ref 26.0–34.0)
MCHC: 32.6 g/dL (ref 30.0–36.0)
MCV: 90.7 fL (ref 78.0–100.0)
MPV: 9.1 fL (ref 8.6–12.4)
Platelets: 212 10*3/uL (ref 150–400)
RBC: 4.6 MIL/uL (ref 4.22–5.81)
RDW: 13.9 % (ref 11.5–15.5)
WBC: 13 10*3/uL — ABNORMAL HIGH (ref 4.0–10.5)

## 2016-03-04 LAB — BASIC METABOLIC PANEL WITH GFR
BUN: 16 mg/dL (ref 7–25)
CO2: 28 mmol/L (ref 20–31)
Calcium: 9 mg/dL (ref 8.6–10.3)
Chloride: 105 mmol/L (ref 98–110)
Creat: 1.06 mg/dL (ref 0.70–1.18)
Glucose, Bld: 90 mg/dL (ref 65–99)
Potassium: 4.8 mmol/L (ref 3.5–5.3)
Sodium: 141 mmol/L (ref 135–146)

## 2016-03-04 LAB — MAGNESIUM: Magnesium: 2 mg/dL (ref 1.5–2.5)

## 2016-03-04 NOTE — Patient Instructions (Signed)
Your physician recommends that you continue on your current medications as directed. Please refer to the Current Medication list given to you today.  Your physician recommends that you return for lab work TODAY (BMET, MAGNESIUM, CBC)  Your physician has requested that you have a lexiscan myoview. For further information please visit [email or web address]. Please follow instruction sheet, as given.

## 2016-03-04 NOTE — Progress Notes (Signed)
Cardiology Office Note   Date:  03/04/2016   ID:  Amante, Kosek 06-20-1946, [name] [id number]  PCP:  Cathlean Cower, MD  Cardiologist:   Dorris Carnes, MD    F/U of mild CAD and HTN     History of Present Illness: Albert Wade is a 70 y.o. male with a history of mld CAD by cath in 2011, COPD ,HL and HTN  I saw him in June 2016.  Also a history of depression  Since I saw the pt he says that he has had more DOE  Some chest pressure   New  Feels washed out.  Stays active        Outpatient Prescriptions Prior to Visit  Medication Sig Dispense Refill  . aspirin 81 MG tablet Take 81 mg by mouth daily.    . furosemide (LASIX) 40 MG tablet Take1 tablet on day one(40 mg)Then take1 and1/2 tab on day two(60mg )and then take two tablets on daythree(80mg ).Then go back to day one 165 tablet 5  . HYDROcodone-homatropine (HYCODAN) 5-1.5 MG/5ML syrup Take 5 mLs by mouth every 6 (six) hours as needed for cough. 180 mL 0  . irbesartan (AVAPRO) 300 MG tablet Take 1 tablet (300 mg total) by mouth daily. 90 tablet 3  . irbesartan (AVAPRO) 300 MG tablet TAKE ONE (1) TABLET EACH DAY 90 tablet 3  . methadone (DOLOPHINE) 10 MG tablet Take 10 mg by mouth 2 (two) times daily.    Marland Kitchen oxyCODONE-acetaminophen (PERCOCET) 10-325 MG per tablet Take 1 tablet by mouth every 4 (four) hours as needed for pain.     . potassium chloride (KLOR-CON 10) 10 MEQ tablet 2 tabs by mouth per day on days taking furosemide 180 tablet 3  . simvastatin (ZOCOR) 80 MG tablet TAKE ONE TABLET EVERY EVENING 90 tablet 3  . topiramate (TOPAMAX) 50 MG tablet Take 50 mg by mouth 3 (three) times daily.    Marland Kitchen zolpidem (AMBIEN) 5 MG tablet Take 1 tablet (5 mg total) by mouth at bedtime as needed. for sleep 90 tablet 1  . citalopram (CELEXA) 20 MG tablet Take 1 tablet (20 mg total) by mouth daily. (Patient not taking: Reported on 03/04/2016) 90 tablet 3   No facility-administered medications prior to visit.     Allergies:   Seroquel   Past  Medical History  Diagnosis Date  . CAD (coronary artery disease)   . Hypercholesteremia   . GERD (gastroesophageal reflux disease)   . Pneumonia   . Anxiety   . Chronic back pain   . Chronic bronchitis   . Depression   . BPH (benign prostatic hypertrophy)   . MI (myocardial infarction) ([facility])     mid-'90's  . Sleep apnea     mild-no cpap, unable to tolerate  . Peripheral neuropathy (HCC)     peripheral neuropathy  . H/O hiatal hernia   . Umbilical hernia   . Arthritis     back,wrists,hx. previous fractures  . Varicose veins     Past Surgical History  Procedure Laterality Date  . Appendectomy    . Angioplasty    . Cardiac catheterization      11'11  . Wrist surgery Right   . Back surgery      x3  . Circumcision N/A 10/21/2013    Procedure: CIRCUMCISION ADULT;  Surgeon: Bernestine Amass, MD;  Location: [facility];  Service: Urology;  Laterality: N/A;  . Cystoscopy N/A 10/21/2013    Procedure: CYSTOSCOPY FLEXIBLE;  Surgeon: Bernestine Amass, MD;  Location: [facility];  Service: Urology;  Laterality: N/A;     Social History:  The patient  reports that he quit smoking about 20 years ago. He has never used smokeless tobacco. He reports that he does not drink alcohol or use illicit drugs.   Family History:  The patient's family history includes Emphysema in his father; Hyperlipidemia in his father and mother; Prostate cancer in his father.    ROS:  Please see the history of present illness. All other systems are reviewed and  Negative to the above problem except as noted.    PHYSICAL EXAM: VS:  BP 163/66 mmHg  Pulse 56  Ht 5\' 10"  (1.778 m)  Wt 200 lb (90.719 kg)  BMI 28.70 kg/m2  GEN: Well nourished, well developed, in no acute distress HEENT: normal Neck: no JVD, carotid bruits, or masses Cardiac: RRR; no murmurs, rubs, or gallops,no edema  Respiratory:  clear to auscultation bilaterally, normal work of breathing GI: soft, nontender, nondistended, + BS  No hepatomegaly  MS:  no deformity Moving all extremities   Skin: warm and dry, no rash Neuro:  Strength and sensation are intact Psych: euthymic mood, full affect   EKG:  EKG is  ordered today.  SB 56 bpm     Lipid Panel    Component Value Date/Time   CHOL 95 06/01/2015 1034   TRIG 82.0 06/01/2015 1034   HDL 37.30* 06/01/2015 1034   CHOLHDL 3 06/01/2015 1034   VLDL 16.4 06/01/2015 1034   LDLCALC 41 06/01/2015 1034   LDLDIRECT 76.6 03/21/2007 0905      Wt Readings from Last 3 Encounters:  03/04/16 200 lb (90.719 kg)  06/25/15 199 lb (90.266 kg)  06/01/15 199 lb 9.6 oz (90.538 kg)      ASSESSMENT AND PLAN:   1  Chest tightness  Concerning  WIll set up for Lexiscan myoview  Keep on curent regimen  Check CBC  2.  HTN  High today  Will check on day of test   Was ok on last visit to Dr Jenny Reichmann  3.  Cramps  Leg cramps  Check BMET and Mg  Stay hydrated     Signed, Dorris Carnes, MD  03/04/2016 3:25 PM    [address], [place], [place]  [address] Phone: [phone]; Fax: [phone]

## 2016-03-09 ENCOUNTER — Telehealth (HOSPITAL_COMMUNITY): Payer: Self-pay | Admitting: *Deleted

## 2016-03-09 ENCOUNTER — Other Ambulatory Visit: Payer: Self-pay | Admitting: *Deleted

## 2016-03-09 DIAGNOSIS — D72829 Elevated white blood cell count, unspecified: Secondary | ICD-10-CM

## 2016-03-09 NOTE — Telephone Encounter (Signed)
Patient given detailed instructions per Myocardial Perfusion Study Information Sheet for the test on 03/14/16. Patient notified to arrive 15 minutes early and that it is imperative to arrive on time for appointment to keep from having the test rescheduled.  If you need to cancel or reschedule your appointment, please call the office within 24 hours of your appointment. Failure to do so may result in a cancellation of your appointment, and a $50 no show fee. Patient verbalized understanding.Hubbard Robinson, RN

## 2016-03-14 ENCOUNTER — Ambulatory Visit (HOSPITAL_COMMUNITY): Payer: Medicare HMO | Attending: Cardiology

## 2016-03-14 ENCOUNTER — Other Ambulatory Visit (INDEPENDENT_AMBULATORY_CARE_PROVIDER_SITE_OTHER): Payer: Medicare HMO | Admitting: *Deleted

## 2016-03-14 DIAGNOSIS — R0789 Other chest pain: Secondary | ICD-10-CM

## 2016-03-14 DIAGNOSIS — I10 Essential (primary) hypertension: Secondary | ICD-10-CM | POA: Diagnosis not present

## 2016-03-14 DIAGNOSIS — R0609 Other forms of dyspnea: Secondary | ICD-10-CM | POA: Insufficient documentation

## 2016-03-14 DIAGNOSIS — R0602 Shortness of breath: Secondary | ICD-10-CM | POA: Insufficient documentation

## 2016-03-14 DIAGNOSIS — D72829 Elevated white blood cell count, unspecified: Secondary | ICD-10-CM

## 2016-03-14 LAB — MYOCARDIAL PERFUSION IMAGING
LV dias vol: 100 mL (ref 62–150)
LV sys vol: 41 mL
Peak HR: 86 {beats}/min
RATE: 0.31
Rest HR: 55 {beats}/min
SDS: 3
SRS: 1
SSS: 4
TID: 0.99

## 2016-03-14 LAB — CBC
HCT: 40.4 % (ref 39.0–52.0)
Hemoglobin: 13.4 g/dL (ref 13.0–17.0)
MCH: 30 pg (ref 26.0–34.0)
MCHC: 33.2 g/dL (ref 30.0–36.0)
MCV: 90.4 fL (ref 78.0–100.0)
MPV: 9.5 fL (ref 8.6–12.4)
Platelets: 194 10*3/uL (ref 150–400)
RBC: 4.47 MIL/uL (ref 4.22–5.81)
RDW: 13.6 % (ref 11.5–15.5)
WBC: 8.3 10*3/uL (ref 4.0–10.5)

## 2016-03-14 MED ORDER — TECHNETIUM TC 99M SESTAMIBI GENERIC - CARDIOLITE
10.3000 | Freq: Once | INTRAVENOUS | Status: AC | PRN
  Administered 2016-03-14: 10 via INTRAVENOUS

## 2016-03-14 MED ORDER — REGADENOSON 0.4 MG/5ML IV SOLN
0.4000 mg | Freq: Once | INTRAVENOUS | Status: AC
  Administered 2016-03-14: 0.4 mg via INTRAVENOUS

## 2016-03-14 MED ORDER — TECHNETIUM TC 99M SESTAMIBI GENERIC - CARDIOLITE
31.6000 | Freq: Once | INTRAVENOUS | Status: AC | PRN
  Administered 2016-03-14: 32 via INTRAVENOUS

## 2016-05-13 ENCOUNTER — Ambulatory Visit: Payer: Medicare HMO | Admitting: Internal Medicine

## 2016-05-19 ENCOUNTER — Ambulatory Visit (INDEPENDENT_AMBULATORY_CARE_PROVIDER_SITE_OTHER): Payer: Medicare HMO | Admitting: Internal Medicine

## 2016-05-19 ENCOUNTER — Ambulatory Visit: Payer: Medicare HMO | Admitting: Internal Medicine

## 2016-05-19 ENCOUNTER — Encounter: Payer: Self-pay | Admitting: Internal Medicine

## 2016-05-19 ENCOUNTER — Other Ambulatory Visit: Payer: Self-pay | Admitting: Internal Medicine

## 2016-05-19 ENCOUNTER — Other Ambulatory Visit (INDEPENDENT_AMBULATORY_CARE_PROVIDER_SITE_OTHER): Payer: Medicare HMO

## 2016-05-19 VITALS — BP 124/70 | HR 64 | Temp 98.6°F | Resp 20 | Wt 205.0 lb

## 2016-05-19 DIAGNOSIS — J069 Acute upper respiratory infection, unspecified: Secondary | ICD-10-CM | POA: Diagnosis not present

## 2016-05-19 DIAGNOSIS — Z0001 Encounter for general adult medical examination with abnormal findings: Secondary | ICD-10-CM | POA: Diagnosis not present

## 2016-05-19 DIAGNOSIS — M549 Dorsalgia, unspecified: Secondary | ICD-10-CM

## 2016-05-19 DIAGNOSIS — R972 Elevated prostate specific antigen [PSA]: Secondary | ICD-10-CM

## 2016-05-19 DIAGNOSIS — R7989 Other specified abnormal findings of blood chemistry: Secondary | ICD-10-CM | POA: Diagnosis not present

## 2016-05-19 DIAGNOSIS — R739 Hyperglycemia, unspecified: Secondary | ICD-10-CM

## 2016-05-19 DIAGNOSIS — Z1159 Encounter for screening for other viral diseases: Secondary | ICD-10-CM | POA: Diagnosis not present

## 2016-05-19 DIAGNOSIS — R6889 Other general symptoms and signs: Secondary | ICD-10-CM | POA: Diagnosis not present

## 2016-05-19 DIAGNOSIS — K429 Umbilical hernia without obstruction or gangrene: Secondary | ICD-10-CM | POA: Diagnosis not present

## 2016-05-19 DIAGNOSIS — G8929 Other chronic pain: Secondary | ICD-10-CM

## 2016-05-19 HISTORY — DX: Hyperglycemia, unspecified: R73.9

## 2016-05-19 HISTORY — DX: Acute upper respiratory infection, unspecified: J06.9

## 2016-05-19 HISTORY — DX: Umbilical hernia without obstruction or gangrene: K42.9

## 2016-05-19 LAB — URINALYSIS, ROUTINE W REFLEX MICROSCOPIC
Bilirubin Urine: NEGATIVE
Hgb urine dipstick: NEGATIVE
Ketones, ur: NEGATIVE
Leukocytes, UA: NEGATIVE
Nitrite: NEGATIVE
RBC / HPF: NONE SEEN
Specific Gravity, Urine: 1.015
Total Protein, Urine: NEGATIVE
Urine Glucose: NEGATIVE
Urobilinogen, UA: 0.2
pH: 6 (ref 5.0–8.0)

## 2016-05-19 LAB — LIPID PANEL
Cholesterol: 187 mg/dL (ref 0–200)
HDL: 28.3 mg/dL — ABNORMAL LOW
Total CHOL/HDL Ratio: 7
Triglycerides: 426 mg/dL — ABNORMAL HIGH (ref 0.0–149.0)

## 2016-05-19 LAB — CBC WITH DIFFERENTIAL/PLATELET
Basophils Absolute: 0 10*3/uL (ref 0.0–0.1)
Basophils Relative: 0.3 % (ref 0.0–3.0)
Eosinophils Absolute: 0.2 10*3/uL (ref 0.0–0.7)
Eosinophils Relative: 2.2 % (ref 0.0–5.0)
HCT: 39.3 % (ref 39.0–52.0)
Hemoglobin: 13.1 g/dL (ref 13.0–17.0)
Lymphocytes Relative: 28.1 % (ref 12.0–46.0)
Lymphs Abs: 2.4 10*3/uL (ref 0.7–4.0)
MCHC: 33.3 g/dL (ref 30.0–36.0)
MCV: 89.3 fl (ref 78.0–100.0)
Monocytes Absolute: 0.5 10*3/uL (ref 0.1–1.0)
Monocytes Relative: 5.9 % (ref 3.0–12.0)
Neutro Abs: 5.5 10*3/uL (ref 1.4–7.7)
Neutrophils Relative %: 63.5 % (ref 43.0–77.0)
Platelets: 251 10*3/uL (ref 150.0–400.0)
RBC: 4.41 Mil/uL (ref 4.22–5.81)
RDW: 13.3 % (ref 11.5–15.5)
WBC: 8.7 10*3/uL (ref 4.0–10.5)

## 2016-05-19 LAB — BASIC METABOLIC PANEL WITH GFR
BUN: 7 mg/dL (ref 6–23)
CO2: 33 meq/L — ABNORMAL HIGH (ref 19–32)
Calcium: 9 mg/dL (ref 8.4–10.5)
Chloride: 105 meq/L (ref 96–112)
Creatinine, Ser: 0.86 mg/dL (ref 0.40–1.50)
GFR: 93.38 mL/min
Glucose, Bld: 106 mg/dL — ABNORMAL HIGH (ref 70–99)
Potassium: 4.1 meq/L (ref 3.5–5.1)
Sodium: 143 meq/L (ref 135–145)

## 2016-05-19 LAB — HEPATIC FUNCTION PANEL
ALT: 12 U/L (ref 0–53)
AST: 16 U/L (ref 0–37)
Albumin: 4 g/dL (ref 3.5–5.2)
Alkaline Phosphatase: 71 U/L (ref 39–117)
Bilirubin, Direct: 0.1 mg/dL (ref 0.0–0.3)
Total Bilirubin: 0.3 mg/dL (ref 0.2–1.2)
Total Protein: 6.7 g/dL (ref 6.0–8.3)

## 2016-05-19 LAB — PSA: PSA: 10.73 ng/mL — ABNORMAL HIGH (ref 0.10–4.00)

## 2016-05-19 LAB — TSH: TSH: 2.75 u[IU]/mL (ref 0.35–4.50)

## 2016-05-19 LAB — LDL CHOLESTEROL, DIRECT: Direct LDL: 78 mg/dL

## 2016-05-19 LAB — HEMOGLOBIN A1C: Hgb A1c MFr Bld: 6 % (ref 4.6–6.5)

## 2016-05-19 MED ORDER — LEVOFLOXACIN 500 MG PO TABS: 500.0000 mg | ORAL_TABLET | Freq: Every day | ORAL | Status: DC

## 2016-05-19 MED ORDER — HYDROCOD POLST-CPM POLST ER 10-8 MG/5ML PO SUER: 5.0000 mL | Freq: Two times a day (BID) | ORAL | Status: DC | PRN

## 2016-05-19 NOTE — Assessment & Plan Note (Signed)
Recent worsening symptomatic, reducible today but tender, for gen surgury referral

## 2016-05-19 NOTE — Assessment & Plan Note (Signed)
stable overall by history and exam, recent data reviewed with pt, and pt to continue medical treatment as before,  to f/u any worsening symptoms or concerns Lab Results  Component Value Date   HGBA1C 6.0 05/19/2016   For f/u lab

## 2016-05-19 NOTE — Patient Instructions (Addendum)
Please take all new medication as prescribed - the antibiotic, and the cough medicine if needed  Please continue all other medications as before, and refills have been done if requested.  Please have the pharmacy call with any other refills you may need.  Please continue your efforts at being more active, low cholesterol diet, and weight control.  You are otherwise up to date with prevention measures today.  You will be contacted regarding the referral for: General Surgury  Please keep your appointments with your specialists as you may have planned  Please go to the LAB in the Basement (turn left off the elevator) for the tests to be done today  You will be contacted by phone if any changes need to be made immediately.  Otherwise, you will receive a letter about your results with an explanation, but please check with MyChart first.  Please remember to sign up for MyChart if you have not done so, as this will be important to you in the future with finding out test results, communicating by private email, and scheduling acute appointments online when needed.  Please return in 6 months, or sooner if needed

## 2016-05-19 NOTE — Assessment & Plan Note (Signed)
Mild to mod, for antibx course,  to f/u any worsening symptoms or concerns  In addition to the time spent performing CPE, I spent an additional 40 minutes face to face,in which greater than 50% of this time was spent in counseling and coordination of care for patient's acute illness as documented.

## 2016-05-19 NOTE — Assessment & Plan Note (Signed)
stable overall by history and exam, recent data reviewed with pt, and pt to continue medical treatment as before,  to f/u any worsening symptoms or concerns  

## 2016-05-19 NOTE — Progress Notes (Signed)
Pre visit review using our clinic review tool, if applicable. No additional management support is needed unless otherwise documented below in the visit note. 

## 2016-05-19 NOTE — Progress Notes (Signed)
Subjective:    Patient ID: Albert Wade, male    DOB: [birth year], 70 y.o.   [name]: [id number]  HPI Here for wellness and f/u;  Overall doing ok;  Pt denies Chest pain, worsening SOB, DOE, wheezing, orthopnea, PND, worsening LE edema, palpitations, dizziness or syncope.  Pt denies neurological change such as new headache, facial or extremity weakness.  Pt denies polydipsia, polyuria, or low sugar symptoms. Pt states overall good compliance with treatment and medications, good tolerability, and has been trying to follow appropriate diet.  Pt denies worsening depressive symptoms, suicidal ideation or panic. No fever, night sweats, wt loss, loss of appetite, or other constitutional symptoms.  Pt states good ability with ADL's, has low fall risk, home safety reviewed and adequate, no other significant changes in hearing or vision, and only occasionally active with exercise, has gained wt again, hard to lose, not very active. Wt Readings from Last 3 Encounters:  05/19/16 205 lb (92.987 kg)  03/14/16 200 lb (90.719 kg)  03/04/16 200 lb (90.719 kg)  incidentally -  Here with 2-3 days acute onset fever, facial pain, pressure, headache, general weakness and malaise, and greenish d/c, with mild ST and cough.   Pt denies polydipsia, polyuria,.  Pt states overall good compliance with meds, trying to follow lower cholesterol, diabetic diet, wt overall stable but little exercise however.   Denies worsening reflux, abd pain, dysphagia, n/v, bowel change or blood, except for worsening pain to the umbilical with tender/soreness worse in the past 2 wks, worse to sit up, better to lie down, not assoc with n/v, bowel change or blood or fever Pt continues to have recurring LBP without change in severity, bowel or bladder change, fever, wt loss,  worsening LE pain/numbness/weakness, gait change or falls. Past Medical History  Diagnosis Date  . CAD (coronary artery disease)   . Hypercholesteremia   . GERD (gastroesophageal  reflux disease)   . Pneumonia   . Anxiety   . Chronic back pain   . Chronic bronchitis   . Depression   . BPH (benign prostatic hypertrophy)   . MI (myocardial infarction) ([place])     mid-'90's  . Sleep apnea     mild-no cpap, unable to tolerate  . Peripheral neuropathy (HCC)     peripheral neuropathy  . H/O hiatal hernia   . Umbilical hernia   . Arthritis     back,wrists,hx. previous fractures  . Varicose veins    Past Surgical History  Procedure Laterality Date  . Appendectomy    . Angioplasty    . Cardiac catheterization      11'11  . Wrist surgery Right   . Back surgery      x3  . Circumcision N/A 10/21/2013    Procedure: CIRCUMCISION ADULT;  Surgeon: Bernestine Amass, MD;  Location: [facility];  Service: Urology;  Laterality: N/A;  . Cystoscopy N/A 10/21/2013    Procedure: CYSTOSCOPY FLEXIBLE;  Surgeon: Bernestine Amass, MD;  Location: [facility];  Service: Urology;  Laterality: N/A;    reports that he quit smoking about 20 years ago. He has never used smokeless tobacco. He reports that he does not drink alcohol or use illicit drugs. family history includes Emphysema in his father; Hyperlipidemia in his father and mother; Prostate cancer in his father. Allergies  Allergen Reactions  . Seroquel [Quetiapine] Hives   Current Outpatient Prescriptions on File Prior to Visit  Medication Sig Dispense Refill  . ALPRAZolam (XANAX) 1 MG tablet Take  1 mg by mouth 2 (two) times daily.     Marland Kitchen aspirin 81 MG tablet Take 81 mg by mouth daily.    . furosemide (LASIX) 40 MG tablet Take1 tablet on day one(40 mg)Then take1 and1/2 tab on day two(60mg )and then take two tablets on daythree(80mg ).Then go back to day one 165 tablet 5  . HYDROcodone-homatropine (HYCODAN) 5-1.5 MG/5ML syrup Take 5 mLs by mouth every 6 (six) hours as needed for cough. 180 mL 0  . irbesartan (AVAPRO) 300 MG tablet Take 1 tablet (300 mg total) by mouth daily. 90 tablet 3  . irbesartan (AVAPRO) 300 MG tablet TAKE ONE (1) TABLET  EACH DAY 90 tablet 3  . methadone (DOLOPHINE) 10 MG tablet Take 10 mg by mouth 2 (two) times daily.    Marland Kitchen oxyCODONE-acetaminophen (PERCOCET) 10-325 MG per tablet Take 1 tablet by mouth every 4 (four) hours as needed for pain.     . potassium chloride (KLOR-CON 10) 10 MEQ tablet 2 tabs by mouth per day on days taking furosemide 180 tablet 3  . sertraline (ZOLOFT) 25 MG tablet Take 25 mg by mouth daily.    . simvastatin (ZOCOR) 80 MG tablet TAKE ONE TABLET EVERY EVENING 90 tablet 3  . topiramate (TOPAMAX) 50 MG tablet Take 50 mg by mouth 3 (three) times daily.    Marland Kitchen zolpidem (AMBIEN) 5 MG tablet Take 1 tablet (5 mg total) by mouth at bedtime as needed. for sleep 90 tablet 1   No current facility-administered medications on file prior to visit.   Review of Systems Constitutional: Negative for increased diaphoresis, or other activity, appetite or siginficant weight change other than noted HENT: Negative for worsening hearing loss, ear pain, facial swelling, mouth sores and neck stiffness.   Eyes: Negative for other worsening pain, redness or visual disturbance.  Respiratory: Negative for choking or stridor Cardiovascular: Negative for other chest pain and palpitations.  Gastrointestinal: Negative for worsening diarrhea, blood in stool, or abdominal distention Genitourinary: Negative for hematuria, flank pain or change in urine volume.  Musculoskeletal: Negative for myalgias or other joint complaints.  Skin: Negative for other color change and wound or drainage.  Neurological: Negative for syncope and numbness. other than noted Hematological: Negative for adenopathy. or other swelling Psychiatric/Behavioral: Negative for hallucinations, SI, self-injury, decreased concentration or other worsening agitation.      Objective:   Physical Exam BP 124/70 mmHg  Pulse 64  Temp(Src) 98.6 F (37 C) (Oral)  Resp 20  Wt 205 lb (92.987 kg)  SpO2 90% VS noted, mild ill  Constitutional: Pt is oriented  to person, place, and time. Appears well-developed and well-nourished, in no significant distress Head: Normocephalic and atraumatic  Eyes: Conjunctivae and EOM are normal. Pupils are equal, round, and reactive to light Right Ear: External ear normal.  Left Ear: External ear normal Nose: Nose normal.  Mouth/Throat: Oropharynx is clear and moist  Bilat tm's with mild erythema.  Max sinus areas mild tender.  Pharynx with mild erythema, no exudate Neck: Normal range of motion. Neck supple. No JVD present. No tracheal deviation present or significant neck LA or mass Cardiovascular: Normal rate, regular rhythm, normal heart sounds and intact distal pulses.   Pulmonary/Chest: Effort normal and breath sounds without rales or wheezing  Abdominal: Soft. Bowel sounds are normal. NT. No HSM ecept for tender reducible umbilical hernia. Musculoskeletal: Normal range of motion. Exhibits no edema, has chronic diffuse lower lumbar tender midline  Lymphadenopathy: Has no cervical adenopathy.  Neurological: Pt  is alert and oriented to person, place, and time. Pt has normal reflexes. No cranial nerve deficit. Motor grossly intact Skin: Skin is warm and dry. No rash noted or new ulcers Psychiatric:  Has normal mood and affect. Behavior is normal.     Assessment & Plan:

## 2016-05-19 NOTE — Assessment & Plan Note (Signed)

## 2016-05-20 LAB — HEPATITIS C ANTIBODY: HCV Ab: NEGATIVE

## 2016-05-24 ENCOUNTER — Ambulatory Visit: Payer: Medicare HMO | Admitting: Internal Medicine

## 2016-05-27 ENCOUNTER — Other Ambulatory Visit: Payer: Self-pay | Admitting: General Surgery

## 2016-05-30 ENCOUNTER — Other Ambulatory Visit: Payer: Self-pay | Admitting: Internal Medicine

## 2016-06-09 ENCOUNTER — Encounter (HOSPITAL_COMMUNITY): Payer: Self-pay | Admitting: *Deleted

## 2016-06-17 ENCOUNTER — Ambulatory Visit (HOSPITAL_COMMUNITY)
Admission: RE | Admit: 2016-06-17 | Discharge: 2016-06-17 | Disposition: A | Discharge status: Home / Self Care | Payer: Medicare HMO | Source: Ambulatory Visit | Attending: General Surgery | Admitting: General Surgery

## 2016-06-17 ENCOUNTER — Ambulatory Visit (HOSPITAL_COMMUNITY): Payer: Medicare HMO | Admitting: Registered Nurse

## 2016-06-17 ENCOUNTER — Encounter (HOSPITAL_COMMUNITY): Payer: Self-pay

## 2016-06-17 ENCOUNTER — Ambulatory Visit (HOSPITAL_COMMUNITY): Payer: Medicare HMO

## 2016-06-17 ENCOUNTER — Encounter (HOSPITAL_COMMUNITY)
Admission: RE | Disposition: A | Discharge status: Home / Self Care | Payer: Self-pay | Source: Ambulatory Visit | Attending: General Surgery

## 2016-06-17 DIAGNOSIS — I252 Old myocardial infarction: Secondary | ICD-10-CM | POA: Insufficient documentation

## 2016-06-17 DIAGNOSIS — K420 Umbilical hernia with obstruction, without gangrene: Secondary | ICD-10-CM | POA: Diagnosis not present

## 2016-06-17 DIAGNOSIS — Z79891 Long term (current) use of opiate analgesic: Secondary | ICD-10-CM | POA: Insufficient documentation

## 2016-06-17 DIAGNOSIS — Z7982 Long term (current) use of aspirin: Secondary | ICD-10-CM | POA: Diagnosis not present

## 2016-06-17 DIAGNOSIS — F419 Anxiety disorder, unspecified: Secondary | ICD-10-CM | POA: Insufficient documentation

## 2016-06-17 DIAGNOSIS — M1990 Unspecified osteoarthritis, unspecified site: Secondary | ICD-10-CM | POA: Insufficient documentation

## 2016-06-17 DIAGNOSIS — I509 Heart failure, unspecified: Secondary | ICD-10-CM | POA: Insufficient documentation

## 2016-06-17 DIAGNOSIS — J449 Chronic obstructive pulmonary disease, unspecified: Secondary | ICD-10-CM | POA: Diagnosis not present

## 2016-06-17 DIAGNOSIS — I10 Essential (primary) hypertension: Secondary | ICD-10-CM | POA: Diagnosis not present

## 2016-06-17 DIAGNOSIS — Z87891 Personal history of nicotine dependence: Secondary | ICD-10-CM | POA: Insufficient documentation

## 2016-06-17 DIAGNOSIS — G4730 Sleep apnea, unspecified: Secondary | ICD-10-CM | POA: Insufficient documentation

## 2016-06-17 DIAGNOSIS — F329 Major depressive disorder, single episode, unspecified: Secondary | ICD-10-CM | POA: Insufficient documentation

## 2016-06-17 DIAGNOSIS — J069 Acute upper respiratory infection, unspecified: Secondary | ICD-10-CM

## 2016-06-17 DIAGNOSIS — K429 Umbilical hernia without obstruction or gangrene: Secondary | ICD-10-CM | POA: Diagnosis present

## 2016-06-17 DIAGNOSIS — Z79899 Other long term (current) drug therapy: Secondary | ICD-10-CM | POA: Diagnosis not present

## 2016-06-17 HISTORY — PX: INSERTION OF MESH: SHX5868

## 2016-06-17 HISTORY — DX: Cardiac murmur, unspecified: R01.1

## 2016-06-17 HISTORY — PX: UMBILICAL HERNIA REPAIR: SHX196

## 2016-06-17 HISTORY — DX: Essential (primary) hypertension: I10

## 2016-06-17 SURGERY — HERNIA REPAIR UMBILICAL ADULT
Anesthesia: General

## 2016-06-17 MED ORDER — SUGAMMADEX SODIUM 200 MG/2ML IV SOLN
INTRAVENOUS | Status: AC
  Filled 2016-06-17: qty 2

## 2016-06-17 MED ORDER — LACTATED RINGERS IV SOLN
INTRAVENOUS | Status: DC | PRN
  Administered 2016-06-17 (×2): via INTRAVENOUS

## 2016-06-17 MED ORDER — 0.9 % SODIUM CHLORIDE (POUR BTL) OPTIME
TOPICAL | Status: DC | PRN
  Administered 2016-06-17: 1000 mL

## 2016-06-17 MED ORDER — EPHEDRINE SULFATE 50 MG/ML IJ SOLN
INTRAMUSCULAR | Status: DC | PRN
  Administered 2016-06-17: 10 mg via INTRAVENOUS

## 2016-06-17 MED ORDER — BUPIVACAINE-EPINEPHRINE 0.25% -1:200000 IJ SOLN
INTRAMUSCULAR | Status: DC | PRN
  Administered 2016-06-17: 30 mL

## 2016-06-17 MED ORDER — PROPOFOL 10 MG/ML IV BOLUS
INTRAVENOUS | Status: AC
  Filled 2016-06-17: qty 20

## 2016-06-17 MED ORDER — DEXAMETHASONE SODIUM PHOSPHATE 10 MG/ML IJ SOLN
INTRAMUSCULAR | Status: DC | PRN
  Administered 2016-06-17: 10 mg via INTRAVENOUS

## 2016-06-17 MED ORDER — MIDAZOLAM HCL 5 MG/5ML IJ SOLN
INTRAMUSCULAR | Status: DC | PRN
  Administered 2016-06-17: 2 mg via INTRAVENOUS

## 2016-06-17 MED ORDER — FENTANYL CITRATE (PF) 100 MCG/2ML IJ SOLN
INTRAMUSCULAR | Status: DC | PRN
  Administered 2016-06-17: 100 ug via INTRAVENOUS
  Administered 2016-06-17: 50 ug via INTRAVENOUS
  Administered 2016-06-17: 100 ug via INTRAVENOUS

## 2016-06-17 MED ORDER — FENTANYL CITRATE (PF) 100 MCG/2ML IJ SOLN: 25.0000 ug | INTRAMUSCULAR | Status: DC | PRN

## 2016-06-17 MED ORDER — ONDANSETRON HCL 4 MG/2ML IJ SOLN: 4.0000 mg | Freq: Four times a day (QID) | INTRAMUSCULAR | Status: DC | PRN

## 2016-06-17 MED ORDER — SUGAMMADEX SODIUM 200 MG/2ML IV SOLN
INTRAVENOUS | Status: DC | PRN
  Administered 2016-06-17: 200 mg via INTRAVENOUS

## 2016-06-17 MED ORDER — BUPIVACAINE-EPINEPHRINE 0.25% -1:200000 IJ SOLN
INTRAMUSCULAR | Status: AC
  Filled 2016-06-17: qty 1

## 2016-06-17 MED ORDER — ACETAMINOPHEN 10 MG/ML IV SOLN
INTRAVENOUS | Status: DC | PRN
  Administered 2016-06-17: 1000 mg via INTRAVENOUS

## 2016-06-17 MED ORDER — FENTANYL CITRATE (PF) 250 MCG/5ML IJ SOLN
INTRAMUSCULAR | Status: AC
  Filled 2016-06-17: qty 5

## 2016-06-17 MED ORDER — CHLORHEXIDINE GLUCONATE 4 % EX LIQD: 1.0000 "application " | Freq: Once | CUTANEOUS | Status: DC

## 2016-06-17 MED ORDER — CEFAZOLIN SODIUM-DEXTROSE 2-4 GM/100ML-% IV SOLN
INTRAVENOUS | Status: AC
  Filled 2016-06-17: qty 100

## 2016-06-17 MED ORDER — MIDAZOLAM HCL 2 MG/2ML IJ SOLN
INTRAMUSCULAR | Status: AC
  Filled 2016-06-17: qty 2

## 2016-06-17 MED ORDER — ROCURONIUM BROMIDE 100 MG/10ML IV SOLN
INTRAVENOUS | Status: DC | PRN
  Administered 2016-06-17: 60 mg via INTRAVENOUS

## 2016-06-17 MED ORDER — LIDOCAINE 2% (20 MG/ML) 5 ML SYRINGE
INTRAMUSCULAR | Status: DC | PRN
  Administered 2016-06-17: 60 mg via INTRAVENOUS

## 2016-06-17 MED ORDER — PROPOFOL 10 MG/ML IV BOLUS
INTRAVENOUS | Status: DC | PRN
  Administered 2016-06-17: 170 mg via INTRAVENOUS

## 2016-06-17 MED ORDER — DEXAMETHASONE SODIUM PHOSPHATE 10 MG/ML IJ SOLN
INTRAMUSCULAR | Status: AC
  Filled 2016-06-17: qty 1

## 2016-06-17 MED ORDER — OXYCODONE HCL 5 MG PO TABS: 5.0000 mg | ORAL_TABLET | Freq: Once | ORAL | Status: DC | PRN

## 2016-06-17 MED ORDER — LIDOCAINE HCL (CARDIAC) 20 MG/ML IV SOLN
INTRAVENOUS | Status: AC
  Filled 2016-06-17: qty 5

## 2016-06-17 MED ORDER — ONDANSETRON HCL 4 MG/2ML IJ SOLN
INTRAMUSCULAR | Status: DC | PRN
  Administered 2016-06-17: 4 mg via INTRAVENOUS

## 2016-06-17 MED ORDER — CEFAZOLIN SODIUM-DEXTROSE 2-4 GM/100ML-% IV SOLN
2.0000 g | INTRAVENOUS | Status: AC
  Administered 2016-06-17: 2 g via INTRAVENOUS
  Filled 2016-06-17: qty 100

## 2016-06-17 MED ORDER — OXYCODONE HCL 5 MG/5ML PO SOLN
5.0000 mg | Freq: Once | ORAL | Status: DC | PRN
  Filled 2016-06-17: qty 5

## 2016-06-17 MED ORDER — ACETAMINOPHEN 10 MG/ML IV SOLN
INTRAVENOUS | Status: AC
  Filled 2016-06-17: qty 100

## 2016-06-17 MED ORDER — ONDANSETRON HCL 4 MG/2ML IJ SOLN
INTRAMUSCULAR | Status: AC
  Filled 2016-06-17: qty 2

## 2016-06-17 MED ORDER — ROCURONIUM BROMIDE 100 MG/10ML IV SOLN
INTRAVENOUS | Status: AC
  Filled 2016-06-17: qty 1

## 2016-06-17 MED ORDER — OXYCODONE-ACETAMINOPHEN 10-325 MG PO TABS: 1.0000 | ORAL_TABLET | ORAL | Status: DC | PRN

## 2016-06-17 SURGICAL SUPPLY — 37 items
BENZOIN TINCTURE PRP APPL 2/3 (GAUZE/BANDAGES/DRESSINGS)
BLADE HEX COATED 2.75 (ELECTRODE) ×2
BLADE SURG SZ10 CARB STEEL (BLADE) ×2
CHLORAPREP W/TINT 26ML (MISCELLANEOUS) ×2
COVER SURGICAL LIGHT HANDLE (MISCELLANEOUS) ×2
DECANTER SPIKE VIAL GLASS SM (MISCELLANEOUS) ×2
DRAPE LAPAROTOMY T 102X78X121 (DRAPES) ×2
ELECT PENCIL ROCKER SW 15FT (MISCELLANEOUS) ×2
ELECT REM PT RETURN 9FT ADLT (ELECTROSURGICAL) ×2
GAUZE SPONGE 4X4 12PLY STRL (GAUZE/BANDAGES/DRESSINGS) ×2
GLOVE BIOGEL PI IND STRL 7.0 (GLOVE) ×1
GLOVE BIOGEL PI INDICATOR 7.0 (GLOVE) ×1
GOWN STRL REUS W/TWL LRG LVL3 (GOWN DISPOSABLE) ×2
GOWN STRL REUS W/TWL XL LVL3 (GOWN DISPOSABLE) ×2
KIT BASIN OR (CUSTOM PROCEDURE TRAY) ×4
LIQUID BAND (GAUZE/BANDAGES/DRESSINGS) ×2
MARKER SKIN DUAL TIP RULER LAB (MISCELLANEOUS)
MESH VENTRALEX ST 8CM LRG (Mesh General) ×2 IMPLANT
NEEDLE HYPO 22GX1.5 SAFETY (NEEDLE) ×2
NEEDLE HYPO 25X1 1.5 SAFETY (NEEDLE)
NS IRRIG 1000ML POUR BTL (IV SOLUTION) ×2
PACK BASIC VI WITH GOWN DISP (CUSTOM PROCEDURE TRAY) ×2
SPONGE LAP 4X18 X RAY DECT (DISPOSABLE) ×2
STRIP CLOSURE SKIN 1/2X4 (GAUZE/BANDAGES/DRESSINGS)
SUT MNCRL AB 4-0 PS2 18 (SUTURE) ×2
SUT NOVA NAB GS-21 0 18 T12 DT (SUTURE) ×2
SUT PROLENE 0 CT 1 30 (SUTURE)
SUT PROLENE 0 CT 1 CR/8 (SUTURE)
SUT PROLENE 0 CT 2 (SUTURE)
SUT SILK 3 0 (SUTURE)
SUT SILK 3 0 SH CR/8 (SUTURE)
SUT SILK 3-0 18XBRD TIE 12 (SUTURE)
SUT VIC AB 3-0 SH 27 (SUTURE)
SUT VIC AB 3-0 SH 27XBRD (SUTURE)
SYR CONTROL 10ML LL (SYRINGE) ×2
TAPE CLOTH SURG 4X10 WHT LF (GAUZE/BANDAGES/DRESSINGS) ×2
TOWEL OR 17X26 10 PK STRL BLUE (TOWEL DISPOSABLE) ×2

## 2016-06-17 NOTE — Op Note (Signed)
Preoperative Diagnosis: Umbilical hernia  Postoprative Diagnosis: Umbilical hernia  Procedure: Procedure(s): REPAIR UMBILICAL HERNIA WITH MESH INSERTION OF MESH   Surgeon: Excell Seltzer T   Assistants: None  Anesthesia:  General LMA anesthesia  Indications: Patient is a 70 year old male who presents with a enlarging symptomatic primary umbilical hernia confirmed on exam.  After discussion regarding indications and risks detailed elsewhere with elected to proceed with repair..   Procedure Detail:  Patient was brought to the operating room, placed in the supine position on the operating table, and general anesthesia induced. He received preoperative IV antibiotics. The abdomen was widely sterilely prepped and draped. Patient timeout was performed and correct procedure verified. A curvilinear incision was made just beneath the umbilicus and dissection carried down into the subcutaneous tissue. The umbilical skin was then dissected away from the  Hernia sac and the hernia sac completely mobilized from surrounding tissue down to the level of the fascia. The sac was opened and contained  Some chronically incarcerated omentum. Adhesions to the omentum were divided with cautery and the omentum completely reduced back into the abdomen. The hernia sac was  Excised down to the level of the fascia. The fascia was mobilized very short distance in all directions from the subcutaneous tissue. The defect measured about 2 cm. An 8 cm Bard ventral patch  Was chosen and introduced into the abdomen and smoothly deployed out in all directions up against the peritoneum. The defect was closed transversely with interrupted 0 Novofil incorporating  The tails of the mesh into the closure and then these were trimmed away.  This appeared to provide nice broad coverage and closure without significant tension. The soft tissue was infiltrated with Marcaine. The subtenon's tissue was closed with interrupted 3-0 Vicryl tacking  the umbilical skin back down to the deep subcutaneous and the skin was closed with running subcuticular 4-0 Monocryl and Liquiban. Sponge needle and instrument counts were correct.    Findings: As above  Estimated Blood Loss:  Minimal         Drains: none  Blood Given: none          Specimens: None        Complications:  * No complications entered in OR log *         Disposition: PACU - hemodynamically stable.         Condition: stable

## 2016-06-17 NOTE — H&P (Signed)
History of Present Illness Albert Kitchen T. Spenser Cong Wade; 05/27/2016 1:42 PM) Patient words: Hernia.  The patient is a 70 year old male who presents with an umbilical hernia. He is referred by Dr. Jenny Reichmann for symptomatic umbilical hernia. He has had a known hernia for a number of years but for some time it was asymptomatic. However in the last year 2 it has gradually enlarged and is causing more frequent episodes of discomfort and occasionally sharp pain. This is related to physical activity such as getting up and down. No associated GI symptoms. He does have significant chronic constipation due to narcotic use for chronic back pain but no nausea or vomiting. He has not had any previous surgery here or previous repairs.   Other Problems Albert Wade, CMA; 05/27/2016 1:17 PM) Back Pain Chest pain Congestive Heart Failure Depression Enlarged Prostate Gastroesophageal Reflux Disease Myocardial infarction Umbilical Hernia Repair  Past Surgical History Albert Wade, [place]; 05/27/2016 1:17 PM) No pertinent past surgical history  Diagnostic Studies History Albert Wade, [place]; 05/27/2016 1:17 PM) Colonoscopy 5-10 years ago  Allergies Albert Wade, [place]; 05/27/2016 1:17 PM) No Known Drug Allergies06/01/2016  Medication History Albert Wade, CMA; 05/27/2016 1:19 PM) Methadone HCl (10MG  Tablet, Oral) Active. Oxycodone-Acetaminophen (10-325MG  Tablet, Oral) Active. ALPRAZolam (1MG  Tablet, Oral) Active. Zolpidem Tartrate (5MG  Tablet, Oral) Active. FLUoxetine HCl (20MG  Capsule, Oral) Active. Irbesartan (300MG  Tablet, Oral) Active. LevoFLOXacin (500MG  Tablet, Oral) Active. Sertraline HCl (25MG  Tablet, Oral) Active. Simvastatin (80MG  Tablet, Oral) Active. Topiramate (50MG  Tablet, Oral) Active. Aspirin (81MG  Tablet DR, Oral) Active. Potassium Chloride (20MEQ Packet, Oral) Active. Medications Reconciled  Social History Albert Wade, CMA; 05/27/2016 1:17 PM) Alcohol use  Remotely quit alcohol use. No caffeine use  Family History Albert Wade, [place]; 05/27/2016 1:17 PM) Alcohol Abuse Father. Arthritis Father.    Review of Systems Albert Wade CMA; 05/27/2016 1:17 PM) General Present- Chills and Fatigue. Not Present- Appetite Loss, Fever, Night Sweats, Weight Gain and Weight Loss. Skin Present- Change in Wart/Mole. Not Present- Dryness, Hives, Jaundice, New Lesions, Non-Healing Wounds, Rash and Ulcer. HEENT Present- Sore Throat and Wears glasses/contact lenses. Not Present- Earache, Hearing Loss, Hoarseness, Nose Bleed, Oral Ulcers, Ringing in the Ears, Seasonal Allergies, Sinus Pain, Visual Disturbances and Yellow Eyes. Respiratory Present- Chronic Cough. Not Present- Bloody sputum, Difficulty Breathing, Snoring and Wheezing. Cardiovascular Present- Chest Pain, Leg Cramps, Shortness of Breath and Swelling of Extremities. Not Present- Difficulty Breathing Lying Down, Palpitations and Rapid Heart Rate. Gastrointestinal Present- Abdominal Pain, Constipation and Indigestion. Not Present- Bloating, Bloody Stool, Change in Bowel Habits, Chronic diarrhea, Difficulty Swallowing, Excessive gas, Gets full quickly at meals, Hemorrhoids, Nausea, Rectal Pain and Vomiting. Male Genitourinary Present- Nocturia. Not Present- Blood in Urine, Change in Urinary Stream, Frequency, Impotence, Painful Urination, Urgency and Urine Leakage.  Vitals Albert Wade CMA; 05/27/2016 1:19 PM) 05/27/2016 1:19 PM Weight: 204.6 lb Height: 70.5in Height was reported by patient. Body Surface Area: 2.12 m Body Mass Index: 28.94 kg/m  Temp.: 98.80F  Pulse: 70 (Regular)  BP: 120/62 (Sitting, Left Arm, Standard)       Physical Exam Albert Wade; 05/27/2016 1:44 PM) The physical exam findings are as follows: Note:General: Alert, somewhat chronically ill-appearing Caucasian male, in no distress Skin: Warm and dry without rash or infection. HEENT: No palpable masses  or thyromegaly. Sclera nonicteric. Pupils equal round and reactive. Lymph nodes: No cervical, supraclavicular, or inguinal nodes palpable. Lungs: Breath sounds clear and equal. No wheezing or increased work of breathing. Cardiovascular: Regular rate and rhythm without murmer.  No JVD or edema. Peripheral pulses intact. No carotid bruits. Abdomen: Moderately obese. Nondistended. Soft and nontender. No masses palpable. No organomegaly. There is a moderate sized reducible tender umbilical hernia which feels to be coming through at approximately 1.5 cm defect.. Extremities: Trace lower extremity edema. Or joint swelling or deformity. No chronic venous stasis changes. Neurologic: Alert and fully oriented. Gait is somewhat slow and moves with some difficulty to the exam table due to back pain Psychiatric: Normal mood and affect. Thought content appropriate with normal judgement and insight    Assessment & Plan Albert Kitchen T. Adison Reifsteck Wade; [id number] PM) UMBILICAL HERNIA WITHOUT OBSTRUCTION OR GANGRENE (K42.9) Impression: He has an enlarging symptomatic umbilical hernia. I think this should be repaired to relieve symptoms and prevent incarceration. He has some significant comorbidities including COPD, history of myocardial infarction and chronic opioid use as well as sleep apnea. However I don't think his risks are excessive for relatively minor procedure. I discussed the nature of the surgery and indications and expected recovery as well as risks of anesthetic complications, bleeding, infection and recurrence. There were given literature. He would like to proceed with repair. We will obtain cardiology clearance preoperatively. Current Plans Pt Education - Pamphlet Given - Hernia Surgery: discussed with patient and provided information. Repair of umbilical hernia with mesh under general anesthesia as an outpatient

## 2016-06-17 NOTE — Anesthesia Procedure Notes (Signed)
Procedure Name: Intubation Date/Time: 06/17/2016 7:42 AM Performed by: Talbot Grumbling Pre-anesthesia Checklist: Patient identified, Emergency Drugs available and Patient being monitored Patient Re-evaluated:Patient Re-evaluated prior to inductionOxygen Delivery Method: Circle system utilized Preoxygenation: Pre-oxygenation with 100% oxygen Intubation Type: IV induction Ventilation: Mask ventilation without difficulty Laryngoscope Size: Mac and 3 Grade View: Grade I Tube type: Oral Tube size: 8.0 mm Number of attempts: 1 Airway Equipment and Method: Stylet Placement Confirmation: ETT inserted through vocal cords under direct vision,  positive ETCO2 and breath sounds checked- equal and bilateral Secured at: 21 cm Tube secured with: Tape Dental Injury: Teeth and Oropharynx as per pre-operative assessment

## 2016-06-17 NOTE — Discharge Instructions (Signed)
[facility] _______[facility] ° °UMBILICAL OR INGUINAL HERNIA REPAIR: POST OP INSTRUCTIONS ° °Always review your discharge instruction sheet given to you by the facility where your surgery was performed. °IF YOU HAVE DISABILITY OR FAMILY LEAVE FORMS, YOU MUST BRING THEM TO THE OFFICE FOR PROCESSING.   °DO NOT GIVE THEM TO YOUR DOCTOR. ° °1. A  prescription for pain medication may be given to you upon discharge.  Take your pain medication as prescribed, if needed.  If narcotic pain medicine is not needed, then you may take acetaminophen (Tylenol) or ibuprofen (Advil) as needed. °2. Take your usually prescribed medications unless otherwise directed. °3. If you need a refill on your pain medication, please contact your pharmacy.  They will contact our office to request authorization. Prescriptions will not be filled after 5 pm or on week-ends. °4. You should follow a light diet the first 24 hours after arrival home, such as soup and crackers, etc.  Be sure to include lots of fluids daily.  Resume your normal diet the day after surgery. °5. Most patients will experience some swelling and bruising around the umbilicus or in the groin and scrotum.  Ice packs and reclining will help.  Swelling and bruising can take several days to resolve.  °6. It is common to experience some constipation if taking pain medication after surgery.  Increasing fluid intake and taking a stool softener (such as Colace) will usually help or prevent this problem from occurring.  A mild laxative (Milk of Magnesia or Miralax) should be taken according to package directions if there are no bowel movements after 48 hours. °7. Unless discharge instructions indicate otherwise, you may remove your bandages 24-48 hours after surgery, and you may shower at that time.  You may have steri-strips (small skin tapes) in place directly over the incision.  These strips should be left on the skin for 7-10 days.  If your surgeon used skin glue on the  incision, you may shower in 24 hours.  The glue will flake off over the next 2-3 weeks.  Any sutures or staples will be removed at the office during your follow-up visit. °8. ACTIVITIES:  You may resume regular (light) daily activities beginning the next day--such as daily self-care, walking, climbing stairs--gradually increasing activities as tolerated.  You may have sexual intercourse when it is comfortable.  Refrain from any heavy lifting or straining until approved by your doctor. °a. You may drive when you are no longer taking prescription pain medication, you can comfortably wear a seatbelt, and you can safely maneuver your car and apply brakes. °b. RETURN TO WORK:  __________________________________________________________ °9. You should see your doctor in the office for a follow-up appointment approximately 2-3 weeks after your surgery.  Make sure that you call for this appointment within a day or two after you arrive home to insure a convenient appointment time. °10. OTHER INSTRUCTIONS:  __________________________________________________________________________________________________________________________________________________________________________________________  °WHEN TO CALL YOUR DOCTOR: °1. Fever over 101.0 °2. Inability to urinate °3. Nausea and/or vomiting °4. Extreme swelling or bruising °5. Continued bleeding from incision. °6. Increased pain, redness, or drainage from the incision ° °The clinic staff is available to answer your questions during regular business hours.  Please don’t hesitate to call and ask to speak to one of the nurses for clinical concerns.  If you have a medical emergency, go to the nearest emergency room or call 911.  A surgeon from [facility] is always on call at the hospital ° ° °  [address], [place], [place], [place]  [address] ?  [address], [place], [place]   [address] [phone] ? [phone] ? FAX [phone] Web site:  [email or web address]   General Anesthesia, Adult, Care After Refer to this sheet in the next few weeks. These instructions provide you with information on caring for yourself after your procedure. Your health care provider may also give you more specific instructions. Your treatment has been planned according to current medical practices, but problems sometimes occur. Call your health care provider if you have any problems or questions after your procedure. WHAT TO EXPECT AFTER THE PROCEDURE After the procedure, it is typical to experience:  Sleepiness.  Nausea and vomiting. HOME CARE INSTRUCTIONS  For the first 24 hours after general anesthesia:  Have a responsible person with you.  Do not drive a car. If you are alone, do not take public transportation.  Do not drink alcohol.  Do not take medicine that has not been prescribed by your health care provider.  Do not sign important papers or make important decisions.  You may resume a normal diet and activities as directed by your health care provider.  Change bandages (dressings) as directed.  If you have questions or problems that seem related to general anesthesia, call the hospital and ask for the anesthetist or anesthesiologist on call. SEEK MEDICAL CARE IF:  You have nausea and vomiting that continue the day after anesthesia.  You develop a rash. SEEK IMMEDIATE MEDICAL CARE IF:   You have difficulty breathing.  You have chest pain.  You have any allergic problems.   This information is not intended to replace advice given to you by your health care provider. Make sure you discuss any questions you have with your health care provider.   Document Released: 03/20/2001 Document Revised: 01/02/2015 Document Reviewed: 04/11/2012 Elsevier Interactive Patient Education [employer].

## 2016-06-17 NOTE — Progress Notes (Signed)
O.K. For patient to got to Short Stay -per Dr. Marcie Bal

## 2016-06-17 NOTE — Anesthesia Preprocedure Evaluation (Signed)
Anesthesia Evaluation  Patient identified by [name], date of birth, ID band Patient awake    Reviewed: Allergy & Precautions, NPO status , Patient's Chart, lab work & pertinent test results  Airway Mallampati: II   Neck ROM: full    Dental   Pulmonary sleep apnea , former smoker,    breath sounds clear to auscultation       Cardiovascular hypertension, + CAD and + Past MI   Rhythm:regular Rate:Normal     Neuro/Psych Anxiety Depression    GI/Hepatic hiatal hernia, GERD  ,  Endo/Other    Renal/GU      Musculoskeletal  (+) Arthritis ,   Abdominal   Peds  Hematology   Anesthesia Other Findings   Reproductive/Obstetrics                             Anesthesia Physical Anesthesia Plan  ASA: III  Anesthesia Plan: General   Post-op Pain Management:    Induction: Intravenous  Airway Management Planned: Oral ETT  Additional Equipment:   Intra-op Plan:   Post-operative Plan: Extubation in OR  Informed Consent: I have reviewed the patients History and Physical, chart, labs and discussed the procedure including the risks, benefits and alternatives for the proposed anesthesia with the patient or authorized representative who has indicated his/her understanding and acceptance.     Plan Discussed with: CRNA, Anesthesiologist and Surgeon  Anesthesia Plan Comments:         Anesthesia Quick Evaluation

## 2016-06-17 NOTE — Transfer of Care (Signed)
Immediate Anesthesia Transfer of Care Note  Patient: Albert Wade  Procedure(s) Performed: Procedure(s): REPAIR UMBILICAL HERNIA WITH MESH (N/A) INSERTION OF MESH (N/A)  Patient Location: PACU  Anesthesia Type:General  Level of Consciousness: awake, alert  and patient cooperative  Airway & Oxygen Therapy: Patient Spontanous Breathing and Patient connected to face mask oxygen  Post-op Assessment: Report given to RN and Post -op Vital signs reviewed and stable  Post vital signs: Reviewed and stable  Last Vitals:  Filed Vitals:   06/17/16 0526  BP: 135/85  Pulse: 63  Temp: 36.4 C  Resp: 16    Last Pain: There were no vitals filed for this visit.       Complications: No apparent anesthesia complications

## 2016-06-17 NOTE — Anesthesia Postprocedure Evaluation (Signed)
Anesthesia Post Note  Patient: Albert Wade  Procedure(s) Performed: Procedure(s) (LRB): REPAIR UMBILICAL HERNIA WITH MESH (N/A) INSERTION OF MESH (N/A)  Patient location during evaluation: PACU Anesthesia Type: General Level of consciousness: awake and alert and patient cooperative Pain management: pain level controlled Vital Signs Assessment: post-procedure vital signs reviewed and stable Respiratory status: spontaneous breathing and respiratory function stable Cardiovascular status: stable Anesthetic complications: no    Last Vitals:  Filed Vitals:   06/17/16 0930 06/17/16 0941  BP: 141/71 128/57  Pulse: 62 68  Temp: 36.6 C 36.6 C  Resp: 17 16    Last Pain:  Filed Vitals:   06/17/16 0947  PainSc: 0-No pain                 Chasiti Waddington S

## 2016-06-17 NOTE — Interval H&P Note (Signed)
History and Physical Interval Note:  06/17/2016 7:29 AM  Albert Wade  has presented today for surgery, with the diagnosis of Umbilical hernia  The various methods of treatment have been discussed with the patient and family. After consideration of risks, benefits and other options for treatment, the patient has consented to  Procedure(s): REPAIR UMBILICAL HERNIA WITH MESH (N/A) INSERTION OF MESH (N/A) as a surgical intervention .  The patient's history has been reviewed, patient examined, no change in status, stable for surgery.  I have reviewed the patient's chart and labs.  Questions were answered to the patient's satisfaction.     [name]

## 2016-07-07 ENCOUNTER — Telehealth: Payer: Self-pay

## 2016-07-07 MED ORDER — ALPRAZOLAM 1 MG PO TABS: 1.0000 mg | ORAL_TABLET | Freq: Two times a day (BID) | ORAL | Status: DC | PRN

## 2016-07-07 MED ORDER — FUROSEMIDE 40 MG PO TABS: ORAL_TABLET | ORAL | Status: DC

## 2016-07-07 MED ORDER — ZOLPIDEM TARTRATE 5 MG PO TABS: 5.0000 mg | ORAL_TABLET | Freq: Every evening | ORAL | Status: DC | PRN

## 2016-07-07 NOTE — Telephone Encounter (Signed)
Albert Wade called to advise that we sent in xanax incorrectly. zolpidem (AMBIEN) 5 MG tablet [id number]  Was the medication that they needed. Please revise and send to pharmacy

## 2016-07-07 NOTE — Telephone Encounter (Signed)
Done hardcopy to Corinne  

## 2016-07-07 NOTE — Telephone Encounter (Signed)
Please advise 

## 2016-07-07 NOTE — Addendum Note (Signed)
Addended by: Biagio Borg on: 07/07/2016 06:09 PM   Modules accepted: Orders

## 2016-07-07 NOTE — Telephone Encounter (Signed)
Medication refill sent to pharmacy  

## 2016-07-07 NOTE — Telephone Encounter (Signed)
ALPRAZolam (XANAX) 1 MG tablet      furosemide (LASIX) 40 MG tablet     Patients is requesting a refill on these medication.

## 2016-07-08 NOTE — Telephone Encounter (Signed)
Medication refill sent to pharmacy  

## 2016-08-24 ENCOUNTER — Encounter (HOSPITAL_COMMUNITY): Payer: Self-pay

## 2016-09-01 ENCOUNTER — Other Ambulatory Visit: Payer: Self-pay | Admitting: Internal Medicine

## 2016-09-21 ENCOUNTER — Ambulatory Visit (INDEPENDENT_AMBULATORY_CARE_PROVIDER_SITE_OTHER): Payer: Medicare HMO

## 2016-09-21 DIAGNOSIS — Z23 Encounter for immunization: Secondary | ICD-10-CM

## 2016-10-14 DIAGNOSIS — M5417 Radiculopathy, lumbosacral region: Secondary | ICD-10-CM | POA: Insufficient documentation

## 2016-10-14 DIAGNOSIS — G8929 Other chronic pain: Secondary | ICD-10-CM | POA: Insufficient documentation

## 2016-10-14 DIAGNOSIS — I83893 Varicose veins of bilateral lower extremities with other complications: Secondary | ICD-10-CM

## 2016-10-14 DIAGNOSIS — M5440 Lumbago with sciatica, unspecified side: Secondary | ICD-10-CM

## 2016-10-14 HISTORY — DX: Varicose veins of bilateral lower extremities with other complications: I83.893

## 2016-10-14 HISTORY — DX: Other chronic pain: G89.29

## 2016-10-14 HISTORY — DX: Radiculopathy, lumbosacral region: M54.17

## 2016-10-20 ENCOUNTER — Telehealth: Payer: Self-pay | Admitting: Emergency Medicine

## 2016-10-20 DIAGNOSIS — N400 Enlarged prostate without lower urinary tract symptoms: Secondary | ICD-10-CM

## 2016-10-20 NOTE — Telephone Encounter (Signed)
Pts wife called and patient would like a referral to be sent in to see a urologist in [place] instead of [place]. He would like to go to [facility]. Phone # is [phone]. [place], [place]. Fax # is [phone]. Please follow up thanks.

## 2016-10-20 NOTE — Telephone Encounter (Signed)
Ok, this has been done 

## 2016-11-08 ENCOUNTER — Telehealth: Payer: Self-pay | Admitting: Internal Medicine

## 2016-11-08 NOTE — Telephone Encounter (Signed)
error 

## 2016-11-11 ENCOUNTER — Encounter: Payer: Self-pay | Admitting: Urology

## 2016-11-11 ENCOUNTER — Ambulatory Visit (INDEPENDENT_AMBULATORY_CARE_PROVIDER_SITE_OTHER): Payer: Medicare HMO | Admitting: Urology

## 2016-11-11 VITALS — BP 161/76 | HR 70 | Ht 70.5 in | Wt 218.6 lb

## 2016-11-11 DIAGNOSIS — R972 Elevated prostate specific antigen [PSA]: Secondary | ICD-10-CM

## 2016-11-11 DIAGNOSIS — N400 Enlarged prostate without lower urinary tract symptoms: Secondary | ICD-10-CM | POA: Diagnosis not present

## 2016-11-11 LAB — URINALYSIS, COMPLETE
Bilirubin, UA: NEGATIVE
Glucose, UA: NEGATIVE
Ketones, UA: NEGATIVE
Leukocytes, UA: NEGATIVE
Nitrite, UA: NEGATIVE
Protein, UA: NEGATIVE
RBC, UA: NEGATIVE
Specific Gravity, UA: 1.02 (ref 1.005–1.030)
Urobilinogen, Ur: 0.2 mg/dL (ref 0.2–1.0)
pH, UA: 5.5 (ref 5.0–7.5)

## 2016-11-11 LAB — MICROSCOPIC EXAMINATION
Bacteria, UA: NONE SEEN
Epithelial Cells (non renal): NONE SEEN /HPF

## 2016-11-11 LAB — BLADDER SCAN AMB NON-IMAGING: Scan Result: 26

## 2016-11-11 MED ORDER — TAMSULOSIN HCL 0.4 MG PO CAPS: 0.4000 mg | ORAL_CAPSULE | Freq: Every day | ORAL | 11 refills | Status: DC

## 2016-11-11 NOTE — Progress Notes (Signed)
11/11/2016 2:40 PM   Albert Wade [birth year] [id number]  Referring provider: Biagio Borg, MD [address] [place], [name] [address]  Chief Complaint  Patient presents with  . Elevated PSA    HPI: The patient is a 70 year old gentleman presents today for evaluation of BPH.  1. BPH He has nocturia 4. He has severe urgency. He also has intermittency, weak stream, and straining most the time. He has frequency half the time. He feels he empties his bladder incompletely less than half the time. He is very bothered by this. He has never tried medications.  IPSS: 26/6.  2. Elevated PSA Patient a PSA drawn in May 2017 that was 10.73. There are no previous PSAs available to me. His father had prostate cancer but did not die from this.  PMH: Past Medical History:  Diagnosis Date  . Anxiety   . Arthritis    back,wrists,hx. previous fractures  . BPH (benign prostatic hypertrophy)   . CAD (coronary artery disease)    angioplasty   . Chronic back pain   . Chronic bronchitis   . Depression   . GERD (gastroesophageal reflux disease)   . H/O hiatal hernia    pts wife states pt does not have   . Heart murmur   . Hypercholesteremia   . Hypertension   . MI (myocardial infarction)    mid-'90's  . Peripheral neuropathy (HCC)    peripheral neuropathy  . Pneumonia   . Sleep apnea    mild-no cpap, unable to tolerate  . Umbilical hernia   . Varicose veins     Surgical History: Past Surgical History:  Procedure Laterality Date  . ANGIOPLASTY    . APPENDECTOMY    . BACK SURGERY     x3  . CARDIAC CATHETERIZATION     11'11  . CIRCUMCISION N/A 10/21/2013   Procedure: CIRCUMCISION ADULT;  Surgeon: Bernestine Amass, MD;  Location: [facility];  Service: Urology;  Laterality: N/A;  . CYSTOSCOPY N/A 10/21/2013   Procedure: CYSTOSCOPY FLEXIBLE;  Surgeon: Bernestine Amass, MD;  Location: [facility];  Service: Urology;  Laterality: N/A;  . INSERTION OF MESH N/A 06/17/2016   Procedure:  INSERTION OF MESH;  Surgeon: Excell Seltzer, MD;  Location: [facility];  Service: General;  Laterality: N/A;  . UMBILICAL HERNIA REPAIR N/A 06/17/2016   Procedure: REPAIR UMBILICAL HERNIA WITH MESH;  Surgeon: Excell Seltzer, MD;  Location: [facility];  Service: General;  Laterality: N/A;  . WRIST SURGERY Right     Home Medications:    Medication List       Accurate as of 11/11/16  2:40 PM. Always use your most recent med list.          ALPRAZolam 1 MG tablet Commonly known as:  XANAX Take 1 tablet (1 mg total) by mouth 2 (two) times daily as needed for anxiety.   aspirin 81 MG tablet Take 81 mg by mouth daily.   budesonide-formoterol 160-4.5 MCG/ACT inhaler Commonly known as:  SYMBICORT Inhale 2 puffs into the lungs 2 (two) times daily.   DULoxetine 30 MG capsule Commonly known as:  CYMBALTA Take 30 mg by mouth daily.   furosemide 40 MG tablet Commonly known as:  LASIX Take1 tablet on day one(40 mg)Then take1 and1/2 tab on day two(60mg )and then take two tablets on daythree(80mg ).Then go back to day one   methadone 10 MG tablet Commonly known as:  DOLOPHINE Take 10 mg by mouth 2 (two) times  daily.   oxyCODONE-acetaminophen 10-325 MG tablet Commonly known as:  PERCOCET Take 1 tablet by mouth every 4 (four) hours as needed for pain.   potassium chloride 10 MEQ tablet Commonly known as:  KLOR-CON 10 2 tabs by mouth per day on days taking furosemide   simvastatin 80 MG tablet Commonly known as:  ZOCOR TAKE ONE TABLET BY MOUTH EVERY EVENING.   tamsulosin 0.4 MG Caps capsule Commonly known as:  FLOMAX Take 1 capsule (0.4 mg total) by mouth daily.   zolpidem 5 MG tablet Commonly known as:  AMBIEN Take 1 tablet (5 mg total) by mouth at bedtime as needed. for sleep       Allergies:  Allergies  Allergen Reactions  . Seroquel [Quetiapine] Hives    Family History: Family History  Problem Relation Age of Onset  . Prostate cancer Father   . Emphysema Father   .  Hyperlipidemia Father   . Hyperlipidemia Mother     Social History:  reports that he quit smoking about 20 years ago. His smoking use included Cigarettes. He has a 20.00 pack-year smoking history. He has never used smokeless tobacco. He reports that he does not drink alcohol or use drugs.  ROS: UROLOGY Frequent Urination?: Yes Hard to postpone urination?: Yes Burning/pain with urination?: Yes Get up at night to urinate?: Yes Leakage of urine?: Yes Urine stream starts and stops?: Yes Trouble starting stream?: Yes Do you have to strain to urinate?: Yes Blood in urine?: No Urinary tract infection?: No Sexually transmitted disease?: No Injury to kidneys or bladder?: No Painful intercourse?: No Weak stream?: Yes Erection problems?: Yes Penile pain?: No  Gastrointestinal Nausea?: No Vomiting?: No Indigestion/heartburn?: Yes Diarrhea?: No Constipation?: Yes  Constitutional Fever: No Night sweats?: No Weight loss?: No Fatigue?: Yes  Skin Skin rash/lesions?: No Itching?: No  Eyes Blurred vision?: No Double vision?: No  Ears/Nose/Throat Sore throat?: No Sinus problems?: No  Hematologic/Lymphatic Swollen glands?: Yes Easy bruising?: No  Cardiovascular Leg swelling?: Yes Chest pain?: Yes  Respiratory Cough?: No Shortness of breath?: No  Endocrine Excessive thirst?: No  Musculoskeletal Back pain?: Yes Joint pain?: Yes  Neurological Headaches?: No Dizziness?: No  Psychologic Depression?: Yes Anxiety?: Yes  Physical Exam: BP (!) 161/76 (BP Location: Left Arm, Patient Position: Sitting, Cuff Size: Large)   Pulse 70   Ht 5' 10.5" (1.791 m)   Wt 218 lb 9.6 oz (99.2 kg)   BMI 30.92 kg/m   Constitutional:  Alert and oriented, No acute distress. HEENT: [place] AT, moist mucus membranes.  Trachea midline, no masses. Cardiovascular: No clubbing, cyanosis, or edema. Respiratory: Normal respiratory effort, no increased work of breathing. GI: Abdomen is soft,  nontender, nondistended, no abdominal masses GU: No CVA tenderness. Normal phallus. Testicles and equal bilaterally. No masses. DRE: 2+ smooth benign. Skin: No rashes, bruises or suspicious lesions. Lymph: No cervical or inguinal adenopathy. Neurologic: Grossly intact, no focal deficits, moving all 4 extremities. Psychiatric: Normal mood and affect.  Laboratory Data: Lab Results  Component Value Date   WBC 8.7 05/19/2016   HGB 13.1 05/19/2016   HCT 39.3 05/19/2016   MCV 89.3 05/19/2016   PLT 251.0 05/19/2016    Lab Results  Component Value Date   CREATININE 0.86 05/19/2016    Lab Results  Component Value Date   PSA 10.73 (H) 05/19/2016    No results found for: TESTOSTERONE  Lab Results  Component Value Date   HGBA1C 6.0 05/19/2016    Urinalysis    Component  Value Date/Time   COLORURINE YELLOW 05/19/2016 [place] 05/19/2016 1610   LABSPEC 1.015 05/19/2016 1610   PHURINE 6.0 05/19/2016 1610   GLUCOSEU NEGATIVE 05/19/2016 1610   HGBUR NEGATIVE 05/19/2016 1610   BILIRUBINUR NEGATIVE 05/19/2016 1610   KETONESUR NEGATIVE 05/19/2016 1610   UROBILINOGEN 0.2 05/19/2016 1610   NITRITE NEGATIVE 05/19/2016 1610   LEUKOCYTESUR NEGATIVE 05/19/2016 1610      Assessment & Plan:    1. Elevated PSA I had a long discussion with the patient regarding the role of PSA testing and prostate cancer screening. We also discussed the natural history of prostate cancer. We discussed that the next step for elevated PSA is to confirm that this is a true elevation with a repeat PSA test. We also discussed with that we typically schedule prostate biopsy pending the results of this repeat PSA. He understands the risks, benefits, indications for prostate biopsy. He understands the risks including bleeding and infection. He understands any blood in his urine, stool, an semen. In his semen and may persist for up to 6 weeks. He also understands the risk of infection of about 1%  would require inpatient hospitalization for IV antibiotics. All questions were answered. The patient has elected to proceed with prostate biopsy pending the results of his repeat PSA.  2. BPH We'll start Flomax 0.4 mg daily. The patient was warned of the risk of orthostatic hypotension and retrograde ejection vision.  Return for prostate biopsy.  Nickie Retort, MD  [facility] [address], [place], [place] [address] [phone]

## 2016-11-12 LAB — PSA TOTAL (REFLEX TO FREE): Prostate Specific Ag, Serum: 17.6 ng/mL — ABNORMAL HIGH (ref 0.0–4.0)

## 2016-11-14 ENCOUNTER — Telehealth: Payer: Self-pay

## 2016-11-15 ENCOUNTER — Telehealth: Payer: Self-pay | Admitting: *Deleted

## 2016-11-15 NOTE — Telephone Encounter (Signed)
Faxed telephone encounter via EPIC to Dr. Baruch Gouty [phone].

## 2016-11-15 NOTE — Telephone Encounter (Signed)
Received anesthesia request for clearance for prostate biopsy by Baruch Gouty, MD on 11/29/16.  Request is for cardiac clearance and patient needs to stop asa 81 mg for 7 days prior to biopsy procedure.  Routing to Dr. Harrington Challenger for review/recommendations.

## 2016-11-15 NOTE — Telephone Encounter (Signed)
OK to proceed with prostate procedure OK to stop aspirin 7 days prior  Resume when safe after

## 2016-11-16 ENCOUNTER — Telehealth: Payer: Self-pay

## 2016-11-16 NOTE — Telephone Encounter (Signed)
Nickie Retort, MD  Lestine Box, LPN        Please let patient know PSA remains elevated and that we should proceed with prostate biopsy as scheduled    No answer.

## 2016-11-21 NOTE — Telephone Encounter (Signed)
Patient is aware 

## 2016-11-21 NOTE — Telephone Encounter (Signed)
Patient's wife called to confirm that she did get your message and she will contact the other doctor to discuss the injections.   Sharyn Lull

## 2016-11-21 NOTE — Telephone Encounter (Signed)
LMOM- injections will not affect prostate bx but should call physician doing back injections.

## 2016-11-21 NOTE — Telephone Encounter (Signed)
Patient is aware of his PSA results and is planning for the biopsy as scheduled.  Patient's wife, Stanton Kidney, has a question regarding the biopsy appointment.  Patient is scheduled for a 4 level injection in his back for back pain on 11/29.  His biopsy is scheduled for 12/5.  She is inquiring if there will be any problems with him having this injection prior to his biopsy.  Please advise.  Patient's wife can be reached at home at [phone].  If no answer, please call cell at [phone] and it is okay to leave a message.

## 2016-11-29 ENCOUNTER — Other Ambulatory Visit: Payer: Self-pay | Admitting: Urology

## 2016-11-29 ENCOUNTER — Ambulatory Visit: Payer: Medicare HMO | Admitting: Urology

## 2016-11-29 ENCOUNTER — Encounter: Payer: Self-pay | Admitting: Urology

## 2016-11-29 VITALS — BP 126/78 | HR 73 | Ht 70.0 in | Wt 216.4 lb

## 2016-11-29 DIAGNOSIS — R972 Elevated prostate specific antigen [PSA]: Secondary | ICD-10-CM | POA: Diagnosis not present

## 2016-11-29 MED ORDER — LEVOFLOXACIN 500 MG PO TABS
500.0000 mg | ORAL_TABLET | Freq: Once | ORAL | Status: AC
  Administered 2016-11-29: 500 mg via ORAL

## 2016-11-29 MED ORDER — GENTAMICIN SULFATE 40 MG/ML IJ SOLN
80.0000 mg | Freq: Once | INTRAMUSCULAR | Status: AC
  Administered 2016-11-29: 80 mg via INTRAMUSCULAR

## 2016-11-29 NOTE — Progress Notes (Signed)
Prostate Biopsy Procedure   Informed consent was obtained after discussing risks/benefits of the procedure.  A time out was performed to ensure correct patient identity.  Pre-Procedure: - Last PSA Level:  Lab Results  Component Value Date   PSA 10.73 (H) 05/19/2016   - Gentamicin given prophylactically - Levaquin 500 mg administered PO -Transrectal Ultrasound performed revealing a 21 gm prostate -No significant hypoechoic or median lobe noted  Procedure: - Prostate block performed using 10 cc 1% lidocaine and biopsies taken from sextant areas, a total of 12 under ultrasound guidance.  Post-Procedure: - Patient tolerated the procedure well - He was counseled to seek immediate medical attention if experiences any severe pain, significant bleeding, or fevers - Return in one week to discuss biopsy results

## 2016-12-01 ENCOUNTER — Telehealth: Payer: Self-pay

## 2016-12-01 ENCOUNTER — Inpatient Hospital Stay
Admission: EM | Admit: 2016-12-01 | Discharge: 2016-12-05 | DRG: 872 | Disposition: A | Discharge status: Home / Self Care | Payer: Medicare HMO | Attending: Internal Medicine | Admitting: Internal Medicine

## 2016-12-01 ENCOUNTER — Encounter: Payer: Self-pay | Admitting: Emergency Medicine

## 2016-12-01 DIAGNOSIS — I252 Old myocardial infarction: Secondary | ICD-10-CM | POA: Diagnosis not present

## 2016-12-01 DIAGNOSIS — N400 Enlarged prostate without lower urinary tract symptoms: Secondary | ICD-10-CM | POA: Diagnosis present

## 2016-12-01 DIAGNOSIS — K219 Gastro-esophageal reflux disease without esophagitis: Secondary | ICD-10-CM | POA: Diagnosis present

## 2016-12-01 DIAGNOSIS — F329 Major depressive disorder, single episode, unspecified: Secondary | ICD-10-CM | POA: Diagnosis present

## 2016-12-01 DIAGNOSIS — T80219A Unspecified infection due to central venous catheter, initial encounter: Secondary | ICD-10-CM

## 2016-12-01 DIAGNOSIS — A419 Sepsis, unspecified organism: Principal | ICD-10-CM | POA: Diagnosis present

## 2016-12-01 DIAGNOSIS — B9620 Unspecified Escherichia coli [E. coli] as the cause of diseases classified elsewhere: Secondary | ICD-10-CM | POA: Diagnosis present

## 2016-12-01 DIAGNOSIS — M549 Dorsalgia, unspecified: Secondary | ICD-10-CM | POA: Diagnosis present

## 2016-12-01 DIAGNOSIS — E7800 Pure hypercholesterolemia, unspecified: Secondary | ICD-10-CM | POA: Diagnosis present

## 2016-12-01 DIAGNOSIS — R509 Fever, unspecified: Secondary | ICD-10-CM | POA: Diagnosis present

## 2016-12-01 DIAGNOSIS — Z79899 Other long term (current) drug therapy: Secondary | ICD-10-CM | POA: Diagnosis not present

## 2016-12-01 DIAGNOSIS — N390 Urinary tract infection, site not specified: Secondary | ICD-10-CM | POA: Diagnosis present

## 2016-12-01 DIAGNOSIS — Z7982 Long term (current) use of aspirin: Secondary | ICD-10-CM | POA: Diagnosis not present

## 2016-12-01 DIAGNOSIS — I2510 Atherosclerotic heart disease of native coronary artery without angina pectoris: Secondary | ICD-10-CM | POA: Diagnosis present

## 2016-12-01 DIAGNOSIS — B9689 Other specified bacterial agents as the cause of diseases classified elsewhere: Secondary | ICD-10-CM | POA: Diagnosis present

## 2016-12-01 DIAGNOSIS — Z7952 Long term (current) use of systemic steroids: Secondary | ICD-10-CM | POA: Diagnosis not present

## 2016-12-01 DIAGNOSIS — G8929 Other chronic pain: Secondary | ICD-10-CM | POA: Diagnosis present

## 2016-12-01 DIAGNOSIS — Z888 Allergy status to other drugs, medicaments and biological substances status: Secondary | ICD-10-CM

## 2016-12-01 DIAGNOSIS — F419 Anxiety disorder, unspecified: Secondary | ICD-10-CM | POA: Diagnosis present

## 2016-12-01 DIAGNOSIS — Z87891 Personal history of nicotine dependence: Secondary | ICD-10-CM | POA: Diagnosis not present

## 2016-12-01 HISTORY — DX: Urinary tract infection, site not specified: N39.0

## 2016-12-01 LAB — COMPREHENSIVE METABOLIC PANEL WITH GFR
ALT: 18 U/L (ref 17–63)
AST: 31 U/L (ref 15–41)
Albumin: 3.6 g/dL (ref 3.5–5.0)
Alkaline Phosphatase: 67 U/L (ref 38–126)
Anion gap: 9 (ref 5–15)
BUN: 9 mg/dL (ref 6–20)
CO2: 28 mmol/L (ref 22–32)
Calcium: 8.7 mg/dL — ABNORMAL LOW (ref 8.9–10.3)
Chloride: 99 mmol/L — ABNORMAL LOW (ref 101–111)
Creatinine, Ser: 1.06 mg/dL (ref 0.61–1.24)
GFR calc Af Amer: >60 mL/min
GFR calc non Af Amer: >60 mL/min
Glucose, Bld: 112 mg/dL — ABNORMAL HIGH (ref 65–99)
Potassium: 4.2 mmol/L (ref 3.5–5.1)
Sodium: 136 mmol/L (ref 135–145)
Total Bilirubin: 1.3 mg/dL — ABNORMAL HIGH (ref 0.3–1.2)
Total Protein: 7.1 g/dL (ref 6.5–8.1)

## 2016-12-01 LAB — URINALYSIS, COMPLETE (UACMP) WITH MICROSCOPIC
Bilirubin Urine: NEGATIVE
Glucose, UA: NEGATIVE mg/dL
Hgb urine dipstick: NEGATIVE
Ketones, ur: NEGATIVE mg/dL
Nitrite: POSITIVE — AB
Protein, ur: NEGATIVE mg/dL
Specific Gravity, Urine: 1.011 (ref 1.005–1.030)
pH: 7 (ref 5.0–8.0)

## 2016-12-01 LAB — CBC WITH DIFFERENTIAL/PLATELET
Basophils Absolute: 0 10*3/uL (ref 0–0.1)
Basophils Relative: 0 %
Eosinophils Absolute: 0.1 10*3/uL (ref 0–0.7)
Eosinophils Relative: 1 %
HCT: 38.5 % — ABNORMAL LOW (ref 40.0–52.0)
Hemoglobin: 13.3 g/dL (ref 13.0–18.0)
Lymphocytes Relative: 8 %
Lymphs Abs: 1.4 10*3/uL (ref 1.0–3.6)
MCH: 30.5 pg (ref 26.0–34.0)
MCHC: 34.5 g/dL (ref 32.0–36.0)
MCV: 88.5 fL (ref 80.0–100.0)
Monocytes Absolute: 1.5 10*3/uL — ABNORMAL HIGH (ref 0.2–1.0)
Monocytes Relative: 8 %
Neutro Abs: 15.2 10*3/uL — ABNORMAL HIGH (ref 1.4–6.5)
Neutrophils Relative %: 83 %
Platelets: 188 10*3/uL (ref 150–440)
RBC: 4.36 MIL/uL — ABNORMAL LOW (ref 4.40–5.90)
RDW: 13.6 % (ref 11.5–14.5)
WBC: 18.3 10*3/uL — ABNORMAL HIGH (ref 3.8–10.6)

## 2016-12-01 LAB — LACTIC ACID, PLASMA: Lactic Acid, Venous: 1.7 mmol/L (ref 0.5–1.9)

## 2016-12-01 MED ORDER — DEXTROSE 5 % IV SOLN: 1.0000 g | INTRAVENOUS | Status: DC

## 2016-12-01 MED ORDER — ONDANSETRON HCL 4 MG/2ML IJ SOLN: 4.0000 mg | Freq: Four times a day (QID) | INTRAMUSCULAR | Status: DC | PRN

## 2016-12-01 MED ORDER — ONDANSETRON HCL 4 MG PO TABS: 4.0000 mg | ORAL_TABLET | Freq: Four times a day (QID) | ORAL | Status: DC | PRN

## 2016-12-01 MED ORDER — OXYCODONE-ACETAMINOPHEN 10-325 MG PO TABS: 1.0000 | ORAL_TABLET | ORAL | Status: DC | PRN

## 2016-12-01 MED ORDER — CEFTRIAXONE SODIUM-DEXTROSE 1-3.74 GM-% IV SOLR
1.0000 g | Freq: Once | INTRAVENOUS | Status: AC
  Administered 2016-12-01: 1 g via INTRAVENOUS
  Filled 2016-12-01: qty 50

## 2016-12-01 MED ORDER — CEFTRIAXONE SODIUM-DEXTROSE 1-3.74 GM-% IV SOLR
1.0000 g | INTRAVENOUS | Status: DC
  Administered 2016-12-02 – 2016-12-03 (×2): 1 g via INTRAVENOUS
  Filled 2016-12-01 (×2): qty 50

## 2016-12-01 MED ORDER — TAMSULOSIN HCL 0.4 MG PO CAPS
0.4000 mg | ORAL_CAPSULE | Freq: Every day | ORAL | Status: DC
  Administered 2016-12-02 – 2016-12-05 (×4): 0.4 mg via ORAL
  Filled 2016-12-01 (×4): qty 1

## 2016-12-01 MED ORDER — DEXTROSE 5 % IV SOLN: 1.0000 g | Freq: Once | INTRAVENOUS | Status: DC

## 2016-12-01 MED ORDER — SODIUM CHLORIDE 0.9 % IV SOLN
INTRAVENOUS | Status: DC
  Administered 2016-12-01 – 2016-12-02 (×3): via INTRAVENOUS

## 2016-12-01 MED ORDER — OXYCODONE-ACETAMINOPHEN 5-325 MG PO TABS
1.0000 | ORAL_TABLET | ORAL | Status: DC | PRN
  Administered 2016-12-01 – 2016-12-05 (×2): 1 via ORAL
  Filled 2016-12-01 (×2): qty 1

## 2016-12-01 MED ORDER — ASPIRIN EC 81 MG PO TBEC
81.0000 mg | DELAYED_RELEASE_TABLET | Freq: Every day | ORAL | Status: DC
  Administered 2016-12-02 – 2016-12-05 (×4): 81 mg via ORAL
  Filled 2016-12-01 (×4): qty 1

## 2016-12-01 MED ORDER — ATORVASTATIN CALCIUM 20 MG PO TABS
40.0000 mg | ORAL_TABLET | Freq: Every day | ORAL | Status: DC
  Administered 2016-12-02 – 2016-12-05 (×4): 40 mg via ORAL
  Filled 2016-12-01 (×4): qty 2

## 2016-12-01 MED ORDER — ACETAMINOPHEN 325 MG PO TABS
650.0000 mg | ORAL_TABLET | Freq: Four times a day (QID) | ORAL | Status: DC | PRN
  Filled 2016-12-01: qty 2

## 2016-12-01 MED ORDER — MOMETASONE FURO-FORMOTEROL FUM 200-5 MCG/ACT IN AERO
2.0000 | INHALATION_SPRAY | Freq: Two times a day (BID) | RESPIRATORY_TRACT | Status: DC
  Administered 2016-12-01 – 2016-12-05 (×8): 2 via RESPIRATORY_TRACT
  Filled 2016-12-01: qty 8.8

## 2016-12-01 MED ORDER — ALPRAZOLAM 1 MG PO TABS
1.0000 mg | ORAL_TABLET | Freq: Two times a day (BID) | ORAL | Status: DC | PRN
  Administered 2016-12-04 – 2016-12-05 (×2): 1 mg via ORAL
  Filled 2016-12-01 (×2): qty 1

## 2016-12-01 MED ORDER — SODIUM CHLORIDE 0.9 % IV BOLUS (SEPSIS)
1000.0000 mL | Freq: Once | INTRAVENOUS | Status: AC
  Administered 2016-12-01: 1000 mL via INTRAVENOUS

## 2016-12-01 MED ORDER — METHADONE HCL 10 MG PO TABS
10.0000 mg | ORAL_TABLET | Freq: Four times a day (QID) | ORAL | Status: DC
  Administered 2016-12-01 – 2016-12-05 (×15): 10 mg via ORAL
  Filled 2016-12-01 (×15): qty 1

## 2016-12-01 MED ORDER — ENOXAPARIN SODIUM 40 MG/0.4ML ~~LOC~~ SOLN
40.0000 mg | SUBCUTANEOUS | Status: DC
  Administered 2016-12-01 – 2016-12-04 (×4): 40 mg via SUBCUTANEOUS
  Filled 2016-12-01 (×4): qty 0.4

## 2016-12-01 MED ORDER — OXYCODONE HCL 5 MG PO TABS
5.0000 mg | ORAL_TABLET | ORAL | Status: DC | PRN
  Administered 2016-12-01: 5 mg via ORAL
  Filled 2016-12-01: qty 1

## 2016-12-01 MED ORDER — FUROSEMIDE 40 MG PO TABS: 40.0000 mg | ORAL_TABLET | ORAL | Status: DC | PRN

## 2016-12-01 MED ORDER — ACETAMINOPHEN 650 MG RE SUPP: 650.0000 mg | Freq: Four times a day (QID) | RECTAL | Status: DC | PRN

## 2016-12-01 MED ORDER — ZOLPIDEM TARTRATE 5 MG PO TABS: 5.0000 mg | ORAL_TABLET | Freq: Every evening | ORAL | Status: DC | PRN

## 2016-12-01 MED ORDER — DULOXETINE HCL 30 MG PO CPEP
30.0000 mg | ORAL_CAPSULE | Freq: Every day | ORAL | Status: DC
  Administered 2016-12-02 – 2016-12-05 (×4): 30 mg via ORAL
  Filled 2016-12-01 (×4): qty 1

## 2016-12-01 NOTE — Telephone Encounter (Signed)
The pt wife called complaining that her husband had a prostate biopsy x 2 days ago and he developed a fever of 102.0 temp last night and now its down to 101. 0. I stressed to the wife that he needs to go to the ER, because he could have an infection from the biopsy that could get in his bloodstream and possible need IV abx. I told her the reason she needs to go to the ER is because he need stat bloodwork. The pt wife verbalize understanding.

## 2016-12-01 NOTE — ED Notes (Signed)
Report given to Megan, RN.

## 2016-12-01 NOTE — ED Notes (Signed)
NAD noted at this time. Pt states he is starting to get a HA. This RN dimmed lights for patient comfort. Apologized and explained delay to patient. Pt states understanding at this time.

## 2016-12-01 NOTE — ED Provider Notes (Signed)
[facility] Emergency Department Provider Note ____________________________________________   I have reviewed the triage vital signs and the triage nursing note.  HISTORY  Chief Complaint Fever and Post-op Problem   Historian Patient and wife  HPI Albert Wade is a 70 y.o. male who recently had prostate biopsy with Dr. Pilar Jarvis on Tuesday, presents with 24 hours of somewhat increased confusion, headaches, and fever yesterday 102.  No nausea or vomiting, but decreased by mouth intake. Denies urinary symptoms. Denies abdominal pain. Denies focal weakness or numbness. Symptoms are moderate. At this moment he is slightly improved from what he has been at its worst yesterday.  No coughing or trouble breathing. No dizziness or passing out.    Past Medical History:  Diagnosis Date  . Anxiety   . Arthritis    back,wrists,hx. previous fractures  . BPH (benign prostatic hypertrophy)   . CAD (coronary artery disease)    angioplasty   . Chronic back pain   . Chronic bronchitis   . Depression   . GERD (gastroesophageal reflux disease)   . H/O hiatal hernia    pts wife states pt does not have   . Heart murmur   . Hypercholesteremia   . Hypertension   . MI (myocardial infarction)    mid-'90's  . Peripheral neuropathy (HCC)    peripheral neuropathy  . Pneumonia   . Sleep apnea    mild-no cpap, unable to tolerate  . Umbilical hernia   . Varicose veins     Patient Active Problem List   Diagnosis Date Noted  . UTI (urinary tract infection) 12/01/2016  . 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 Lumbosacral radiculopathy 10/14/2016  . Varicose veins of bilateral lower extremities with other complications [id number]  . Umbilical hernia without obstruction and without gangrene 05/19/2016  . Hyperglycemia 05/19/2016  . Acute upper respiratory infection 05/19/2016  . Gynecomastia 05/12/2015  . Encounter for preventative adult health care exam with  abnormal findings 12/20/2013  . Impaired glucose tolerance 12/20/2013  . GERD (gastroesophageal reflux disease)   . Anxiety   . Chronic back pain   . Depression   . BPH (benign prostatic hypertrophy)   . Peripheral neuropathy ([place])   . Phimosis/adherent prepuce 10/21/2013  . OSA (obstructive sleep apnea) 05/13/2011  . CAD 10/16/2009  . HYPERLIPIDEMIA 04/29/2008  . MYOCARDIAL INFARCTION 04/29/2008  . BRONCHITIS, CHRONIC 04/29/2008    Past Surgical History:  Procedure Laterality Date  . ANGIOPLASTY    . APPENDECTOMY    . BACK SURGERY     x3  . CARDIAC CATHETERIZATION     11'11  . CIRCUMCISION N/A 10/21/2013   Procedure: CIRCUMCISION ADULT;  Surgeon: Bernestine Amass, MD;  Location: [facility];  Service: Urology;  Laterality: N/A;  . CYSTOSCOPY N/A 10/21/2013   Procedure: CYSTOSCOPY FLEXIBLE;  Surgeon: Bernestine Amass, MD;  Location: [facility];  Service: Urology;  Laterality: N/A;  . INSERTION OF MESH N/A 06/17/2016   Procedure: INSERTION OF MESH;  Surgeon: Excell Seltzer, MD;  Location: [facility];  Service: General;  Laterality: N/A;  . UMBILICAL HERNIA REPAIR N/A 06/17/2016   Procedure: REPAIR UMBILICAL HERNIA WITH MESH;  Surgeon: Excell Seltzer, MD;  Location: [facility];  Service: General;  Laterality: N/A;  . WRIST SURGERY Right     Prior to Admission medications   Medication Sig Start Date End Date Taking? Authorizing Provider  ALPRAZolam Duanne Moron) 1 MG tablet Take 1 tablet (1 mg total) by mouth 2 (two)  times daily as needed for anxiety. 07/07/16  Yes Biagio Borg, MD  aspirin 81 MG tablet Take 81 mg by mouth daily.   Yes Historical Provider, MD  budesonide-formoterol (SYMBICORT) 160-4.5 MCG/ACT inhaler Inhale 2 puffs into the lungs 2 (two) times daily.   Yes Historical Provider, MD  DULoxetine (CYMBALTA) 30 MG capsule Take 30 mg by mouth daily.   Yes Historical Provider, MD  furosemide (LASIX) 40 MG tablet Take1 tablet on day one(40 mg)Then take1 and1/2 tab on day two(60mg )and then take  two tablets on daythree(80mg ).Then go back to day one Patient taking differently: Take 40 mg by mouth as needed.  07/07/16  Yes Biagio Borg, MD  methadone (DOLOPHINE) 10 MG tablet Take 10 mg by mouth 4 (four) times daily.    Yes Historical Provider, MD  oxyCODONE-acetaminophen (PERCOCET) 10-325 MG tablet Take 1 tablet by mouth every 4 (four) hours as needed for pain. 06/17/16  Yes Excell Seltzer, MD  potassium chloride (KLOR-CON 10) 10 MEQ tablet 2 tabs by mouth per day on days taking furosemide Patient taking differently: Take 10 mEq by mouth as needed. 2 tabs by mouth per day on days taking furosemide 05/08/14  Yes Fay Records, MD  simvastatin (ZOCOR) 80 MG tablet TAKE ONE TABLET BY MOUTH EVERY EVENING. 09/01/16  Yes Biagio Borg, MD  tamsulosin (FLOMAX) 0.4 MG CAPS capsule Take 1 capsule (0.4 mg total) by mouth daily. 11/11/16  Yes Nickie Retort, MD  zolpidem (AMBIEN) 5 MG tablet Take 1 tablet (5 mg total) by mouth at bedtime as needed. for sleep 07/07/16  Yes Biagio Borg, MD    Allergies  Allergen Reactions  . Seroquel [Quetiapine] Hives    Family History  Problem Relation Age of Onset  . Prostate cancer Father   . Emphysema Father   . Hyperlipidemia Father   . Hyperlipidemia Mother     Social History Social History  Substance Use Topics  . Smoking status: Former Smoker    Packs/day: 0.50    Years: 40.00    Types: Cigarettes    Quit date: 12/27/1995  . Smokeless tobacco: Never Used  . Alcohol use No    Review of Systems  Constitutional: ositivefor fever. Eyes: Negative for visual changes. ENT: Negative for sore throat. Cardiovascular: Negative for chest pain. Respiratory: Negative for shortness of breath. Gastrointestinal: Negative for abdominal pain, vomiting and diarrhea. Genitourinary: Negative for dysuria. Musculoskeletal: Negative for back pain. Skin: Negative for rash. Neurological: Positive for intermittent global headaches for several weeks. 10 point  Review of Systems otherwise negative ____________________________________________   PHYSICAL EXAM:  VITAL SIGNS: ED Triage Vitals  Enc Vitals Group     BP 12/01/16 0912 125/66     Pulse Rate 12/01/16 0912 97     Resp 12/01/16 0912 18     Temp 12/01/16 0912 98.7 F (37.1 C)     Temp Source 12/01/16 0912 Oral     SpO2 12/01/16 0912 95 %     Weight 12/01/16 0907 215 lb (97.5 kg)     Height 12/01/16 0907 5\' 10"  (1.778 m)     Head Circumference --      Peak Flow --      Pain Score 12/01/16 0908 10     Pain Loc --      Pain Edu? --      Excl. in [place]? --      Constitutional: Alert and oriented, cooperative, slightly poor historian. Well appearing and in no  distress. HEENT   Head: Normocephalic and atraumatic.      Eyes: Conjunctivae are normal. PERRL. Normal extraocular movements.      Ears:         Nose: No congestion/rhinnorhea.   Mouth/Throat: Mucous membranes are moist.   Neck: No stridor. Cardiovascular/Chest: Normal rate, regular rhythm.  No murmurs, rubs, or gallops. Respiratory: Normal respiratory effort without tachypnea nor retractions. Breath sounds are clear and equal bilaterally. No wheezes/rales/rhonchi. Gastrointestinal: Soft. No distention, no guarding, no rebound. Nontender.    Genitourinary/rectal:Deferred Musculoskeletal: Nontender with normal range of motion in all extremities. No joint effusions.  No lower extremity tenderness.  No edema. Neurologic:  Normal speech and language. No gross or focal neurologic deficits are appreciated. Skin:  Skin is warm, dry and intact. No rash noted. Psychiatric: Mood and affect are normal. Speech and behavior are normal. Patient exhibits appropriate insight and judgment.   ____________________________________________  LABS (pertinent positives/negatives)  Labs Reviewed  URINALYSIS, COMPLETE (UACMP) WITH MICROSCOPIC - Abnormal; Notable for the following:       Result Value   Color, Urine YELLOW (*)     APPearance CLEAR (*)    Nitrite POSITIVE (*)    Leukocytes, UA SMALL (*)    Bacteria, UA MANY (*)    Squamous Epithelial / LPF 0-5 (*)    All other components within normal limits  COMPREHENSIVE METABOLIC PANEL - Abnormal; Notable for the following:    Chloride 99 (*)    Glucose, Bld 112 (*)    Calcium 8.7 (*)    Total Bilirubin 1.3 (*)    All other components within normal limits  CBC WITH DIFFERENTIAL/PLATELET - Abnormal; Notable for the following:    WBC 18.3 (*)    RBC 4.36 (*)    HCT 38.5 (*)    Neutro Abs 15.2 (*)    Monocytes Absolute 1.5 (*)    All other components within normal limits  CULTURE, BLOOD (ROUTINE X 2)  CULTURE, BLOOD (ROUTINE X 2)  LACTIC ACID, PLASMA  LACTIC ACID, PLASMA    ____________________________________________    EKG I, Lisa Roca, MD, the attending physician have personally viewed and interpreted all ECGs.  None ____________________________________________  RADIOLOGY All Xrays were viewed by me. Imaging interpreted by Radiologist.  None __________________________________________  PROCEDURES  Procedure(s) performed: None  Critical Care performed: None  ____________________________________________   ED COURSE / ASSESSMENT AND PLAN  Pertinent labs & imaging results that were available during my care of the patient were reviewed by me and considered in my medical decision making (see chart for details).   Overall well appearing, with stable vitals.  Clinical exam not concerning for meningitis.  Complaints of some confusion, although perhaps not unusual episodes per wife, and fever after recent prostate biopsy.  No respiratory symptoms.  Urinalysis nitrate positive UTI, with elevated white blood cell count on laboratory studies in the setting of possible confusion  And fever 102, I think patient needs IV antibiotics and observation admission for the possibly of sepsis.    CONSULTATIONS:  Hospitalist for  admission.   Patient / Family / Caregiver informed of clinical course, medical decision-making process, and agree with plan.    ___________________________________________   FINAL CLINICAL IMPRESSION(S) / ED DIAGNOSES   Final diagnoses:  Urinary tract infection without hematuria, site unspecified              Note: This dictation was prepared with Dragon dictation. Any transcriptional errors that result from this process are unintentional  Lisa Roca, MD 12/01/16 [phone]

## 2016-12-01 NOTE — ED Notes (Signed)
Report given to Emily, RN.

## 2016-12-01 NOTE — ED Notes (Signed)
Pt agreeable to being admitted, NAD noted at this time. Pt's abx started at this time. Will continue to monitor for further patient needs. Pt adjusted position in the bed.

## 2016-12-01 NOTE — H&P (Signed)
[facility] [place] NAME: Albert Wade    MR#:  [id number]  DATE OF BIRTH:  1946-07-13  DATE OF ADMISSION:  12/01/2016  PRIMARY CARE PHYSICIAN: Cathlean Cower, MD   REQUESTING/REFERRING PHYSICIAN: Dr. Lisa Roca  CHIEF COMPLAINT:   Chief Complaint  Patient presents with  . Fever  . Post-op Problem    HISTORY OF PRESENT ILLNESS:  Albert Wade  is a 70 y.o. male with a known history of Chronic back pain, history of coronary artery disease status post MI, BPH, obstructive sleep apnea, neuropathy, hypertension, hyperlipidemia, history of umbilical hernia, status post recent prostate biopsy for elevated PSA who presents to the hospital due to fever or chills headache. Patient woke up this morning and had a headache and was not feeling well and his wife checked his temperature was elevated as high as 102. He has also been complaining of some dysuria and also frequency of urination. Patient's wife call the urologist who recommended he come to the ER for further evaluation. In the emergency and patient's urinalysis was grossly abnormal consistent with a urinary tract infection and he was also noted to have a leukocytosis. Hospitalist services were contacted further treatment and evaluation. Patient denies any chest pain, shortness of breath, nausea, vomiting, abdominal pain or any other associated symptoms presently.  PAST MEDICAL HISTORY:   Past Medical History:  Diagnosis Date  . Anxiety   . Arthritis    back,wrists,hx. previous fractures  . BPH (benign prostatic hypertrophy)   . CAD (coronary artery disease)    angioplasty   . Chronic back pain   . Chronic bronchitis   . Depression   . GERD (gastroesophageal reflux disease)   . H/O hiatal hernia    pts wife states pt does not have   . Heart murmur   . Hypercholesteremia   . Hypertension   . MI (myocardial infarction)    mid-'90's  . Peripheral neuropathy (HCC)    peripheral neuropathy   . Pneumonia   . Sleep apnea    mild-no cpap, unable to tolerate  . Umbilical hernia   . Varicose veins     PAST SURGICAL HISTORY:   Past Surgical History:  Procedure Laterality Date  . ANGIOPLASTY    . APPENDECTOMY    . BACK SURGERY     x3  . CARDIAC CATHETERIZATION     11'11  . CIRCUMCISION N/A 10/21/2013   Procedure: CIRCUMCISION ADULT;  Surgeon: Bernestine Amass, MD;  Location: [facility];  Service: Urology;  Laterality: N/A;  . CYSTOSCOPY N/A 10/21/2013   Procedure: CYSTOSCOPY FLEXIBLE;  Surgeon: Bernestine Amass, MD;  Location: [facility];  Service: Urology;  Laterality: N/A;  . INSERTION OF MESH N/A 06/17/2016   Procedure: INSERTION OF MESH;  Surgeon: Excell Seltzer, MD;  Location: [facility];  Service: General;  Laterality: N/A;  . UMBILICAL HERNIA REPAIR N/A 06/17/2016   Procedure: REPAIR UMBILICAL HERNIA WITH MESH;  Surgeon: Excell Seltzer, MD;  Location: [facility];  Service: General;  Laterality: N/A;  . WRIST SURGERY Right     SOCIAL HISTORY:   Social History  Substance Use Topics  . Smoking status: Former Smoker    Packs/day: 0.50    Years: 40.00    Types: Cigarettes    Quit date: 12/27/1995  . Smokeless tobacco: Never Used  . Alcohol use No    FAMILY HISTORY:   Family History  Problem Relation Age of Onset  . Prostate cancer Father   .  Emphysema Father   . Hyperlipidemia Father   . Hyperlipidemia Mother     DRUG ALLERGIES:   Allergies  Allergen Reactions  . Seroquel [Quetiapine] Hives    REVIEW OF SYSTEMS:   Review of Systems  Constitutional: Positive for chills and fever. Negative for weight loss.  HENT: Negative for congestion, nosebleeds and tinnitus.   Eyes: Negative for blurred vision, double vision and redness.  Respiratory: Negative for cough, hemoptysis and shortness of breath.   Cardiovascular: Negative for chest pain, orthopnea, leg swelling and PND.  Gastrointestinal: Negative for abdominal pain, diarrhea, melena, nausea and vomiting.   Genitourinary: Positive for dysuria, frequency and hematuria. Negative for urgency.  Musculoskeletal: Negative for falls and joint pain.  Neurological: Negative for dizziness, tingling, sensory change, focal weakness, seizures, weakness and headaches.  Endo/Heme/Allergies: Negative for polydipsia. Does not bruise/bleed easily.  Psychiatric/Behavioral: Negative for depression and memory loss. The patient is not nervous/anxious.     MEDICATIONS AT HOME:   Prior to Admission medications   Medication Sig Start Date End Date Taking? Authorizing Provider  ALPRAZolam Duanne Moron) 1 MG tablet Take 1 tablet (1 mg total) by mouth 2 (two) times daily as needed for anxiety. 07/07/16  Yes Biagio Borg, MD  aspirin 81 MG tablet Take 81 mg by mouth daily.   Yes Historical Provider, MD  budesonide-formoterol (SYMBICORT) 160-4.5 MCG/ACT inhaler Inhale 2 puffs into the lungs 2 (two) times daily.   Yes Historical Provider, MD  DULoxetine (CYMBALTA) 30 MG capsule Take 30 mg by mouth daily.   Yes Historical Provider, MD  furosemide (LASIX) 40 MG tablet Take1 tablet on day one(40 mg)Then take1 and1/2 tab on day two(77m)and then take two tablets on daythree(833m.Then go back to day one Patient taking differently: Take 40 mg by mouth as needed.  07/07/16  Yes JaBiagio BorgMD  methadone (DOLOPHINE) 10 MG tablet Take 10 mg by mouth 4 (four) times daily.    Yes Historical Provider, MD  oxyCODONE-acetaminophen (PERCOCET) 10-325 MG tablet Take 1 tablet by mouth every 4 (four) hours as needed for pain. 06/17/16  Yes BeExcell SeltzerMD  potassium chloride (KLOR-CON 10) 10 MEQ tablet 2 tabs by mouth per day on days taking furosemide Patient taking differently: Take 10 mEq by mouth as needed. 2 tabs by mouth per day on days taking furosemide 05/08/14  Yes PaFay RecordsMD  simvastatin (ZOCOR) 80 MG tablet TAKE ONE TABLET BY MOUTH EVERY EVENING. 09/01/16  Yes JaBiagio BorgMD  tamsulosin (FLOMAX) 0.4 MG CAPS capsule Take 1 capsule  (0.4 mg total) by mouth daily. 11/11/16  Yes BrNickie RetortMD  zolpidem (AMBIEN) 5 MG tablet Take 1 tablet (5 mg total) by mouth at bedtime as needed. for sleep 07/07/16  Yes JaBiagio BorgMD      VITAL SIGNS:  Blood pressure 126/71, pulse 91, temperature 98.7 F (37.1 C), temperature source Oral, resp. rate 17, height 5' 10" (1.778 m), weight 97.5 kg (215 lb), SpO2 98 %.  PHYSICAL EXAMINATION:  Physical Exam  GENERAL:  [age].o.-year-old patient lying in the bed in no acute distress.  EYES: Pupils equal, round, reactive to light and accommodation. No scleral icterus. Extraocular muscles intact.  HEENT: Head atraumatic, normocephalic. Oropharynx and nasopharynx clear. No oropharyngeal erythema, moist oral mucosa  NECK:  Supple, no jugular venous distention. No thyroid enlargement, no tenderness.  LUNGS: Normal breath sounds bilaterally, no wheezing, rales, rhonchi. No use of accessory muscles of respiration.  CARDIOVASCULAR: S1,  S2 RRR. No murmurs, rubs, gallops, clicks.  ABDOMEN: Soft, nontender, nondistended. Bowel sounds present. No organomegaly or mass.  EXTREMITIES: No pedal edema, cyanosis, or clubbing. + 2 pedal & radial pulses b/l.   NEUROLOGIC: Cranial nerves II through XII are intact. No focal Motor or sensory deficits appreciated b/l PSYCHIATRIC: The patient is alert and oriented x 3. Good affect.  SKIN: No obvious rash, lesion, or ulcer.   LABORATORY PANEL:   CBC  Recent Labs Lab 12/01/16 0951  WBC 18.3*  HGB 13.3  HCT 38.5*  PLT 188   ------------------------------------------------------------------------------------------------------------------  Chemistries   Recent Labs Lab 12/01/16 0951  NA 136  K 4.2  CL 99*  CO2 28  GLUCOSE 112*  BUN 9  CREATININE 1.06  CALCIUM 8.7*  AST 31  ALT 18  ALKPHOS 67  BILITOT 1.3*   ------------------------------------------------------------------------------------------------------------------  Cardiac  Enzymes No results for input(s): TROPONINI in the last 168 hours. ------------------------------------------------------------------------------------------------------------------  RADIOLOGY:  No results found.   IMPRESSION AND PLAN:   70 year old male with past medical history of coronary artery disease status post MI, BPH, chronic back pain, neuropathy, hypertension, hyperlipidemia, history of umbilical hernia who presented to the hospital due to fever or chills headache and noted to have a urinary tract infection.  1. Sepsis-patient met admission criteria with fever, leukocytosis and abnormal urinalysis.  -source is likely urinary tract infection. I will treat the patient with IV ceftriaxone, follow blood, urine cultures. -Follow fever curve and hemodynamics.  2. Urinary tract infection-source of patient's sepsis. -Place on IV ceftriaxone, follow urine, blood cultures.  3. Chronic back pain-continue methadone, Percocet as needed.  4. Anxiety/depression-continue Xanax, Cymbalta.  5. BPH-continue Flomax.  6. Hyperlipidemia-continue simvastatin.  7. History of coronary artery disease-no acute chest pain or acute symptoms. -Continue aspirin, statin.   All the records are reviewed and case discussed with ED provider. Management plans discussed with the patient, family and they are in agreement.  CODE STATUS: Full  TOTAL TIME TAKING CARE OF THIS PATIENT: 45 minutes.    Henreitta Leber M.D on 12/01/2016 at 2:30 PM  Between 7am to 6pm - Pager - [phone]  After 6pm go to [email or web address] - password [email or web address] [facility]  [facility]  Office  [phone]  CC: Primary care physician; Cathlean Cower, MD

## 2016-12-01 NOTE — ED Triage Notes (Signed)
Patient presents to the ED after having a prostate biopsy on Tuesday.  Patient is having headache, chills, and fever.  Patient's wife reports symptoms began during the night.  Patient's wife also reports patient takes medication for chronic pain and that he may have early dementia.  Patient appears pale and tired.  Ambulatory to triage without obvious difficulty.

## 2016-12-01 NOTE — ED Notes (Signed)
Pt's wife out to desk to states pt is getting agitated and wants to leave. MD notified at this time. Pt resting in bed. Visualized in NAD at this time. Will continue to monitor for further patient needs.

## 2016-12-02 ENCOUNTER — Telehealth: Payer: Self-pay

## 2016-12-02 LAB — BLOOD CULTURE ID PANEL (REFLEXED)
Acinetobacter baumannii: NOT DETECTED
Acinetobacter baumannii: NOT DETECTED
Candida albicans: NOT DETECTED
Candida albicans: NOT DETECTED
Candida glabrata: NOT DETECTED
Candida glabrata: NOT DETECTED
Candida krusei: NOT DETECTED
Candida krusei: NOT DETECTED
Candida parapsilosis: NOT DETECTED
Candida parapsilosis: NOT DETECTED
Candida tropicalis: NOT DETECTED
Candida tropicalis: NOT DETECTED
Carbapenem resistance: NOT DETECTED
Enterobacter cloacae complex: NOT DETECTED
Enterobacter cloacae complex: NOT DETECTED
Enterobacteriaceae species: DETECTED — AB
Enterobacteriaceae species: NOT DETECTED
Enterococcus species: NOT DETECTED
Enterococcus species: NOT DETECTED
Escherichia coli: DETECTED — AB
Escherichia coli: NOT DETECTED
Haemophilus influenzae: NOT DETECTED
Haemophilus influenzae: NOT DETECTED
Klebsiella oxytoca: NOT DETECTED
Klebsiella oxytoca: NOT DETECTED
Klebsiella pneumoniae: NOT DETECTED
Klebsiella pneumoniae: NOT DETECTED
Listeria monocytogenes: NOT DETECTED
Listeria monocytogenes: NOT DETECTED
Methicillin resistance: NOT DETECTED
Neisseria meningitidis: NOT DETECTED
Neisseria meningitidis: NOT DETECTED
Proteus species: NOT DETECTED
Proteus species: NOT DETECTED
Pseudomonas aeruginosa: NOT DETECTED
Pseudomonas aeruginosa: NOT DETECTED
Serratia marcescens: NOT DETECTED
Serratia marcescens: NOT DETECTED
Staphylococcus aureus (BCID): NOT DETECTED
Staphylococcus aureus (BCID): NOT DETECTED
Staphylococcus species: NOT DETECTED
Staphylococcus species: DETECTED — AB
Streptococcus agalactiae: NOT DETECTED
Streptococcus agalactiae: NOT DETECTED
Streptococcus pneumoniae: NOT DETECTED
Streptococcus pneumoniae: NOT DETECTED
Streptococcus pyogenes: NOT DETECTED
Streptococcus pyogenes: NOT DETECTED
Streptococcus species: NOT DETECTED
Streptococcus species: NOT DETECTED

## 2016-12-02 LAB — BASIC METABOLIC PANEL WITH GFR
Anion gap: 5 (ref 5–15)
BUN: 9 mg/dL (ref 6–20)
CO2: 28 mmol/L (ref 22–32)
Calcium: 7.9 mg/dL — ABNORMAL LOW (ref 8.9–10.3)
Chloride: 103 mmol/L (ref 101–111)
Creatinine, Ser: 0.95 mg/dL (ref 0.61–1.24)
GFR calc Af Amer: >60 mL/min
GFR calc non Af Amer: >60 mL/min
Glucose, Bld: 115 mg/dL — ABNORMAL HIGH (ref 65–99)
Potassium: 3.6 mmol/L (ref 3.5–5.1)
Sodium: 136 mmol/L (ref 135–145)

## 2016-12-02 LAB — CBC
HCT: 34.2 % — ABNORMAL LOW (ref 40.0–52.0)
Hemoglobin: 11.7 g/dL — ABNORMAL LOW (ref 13.0–18.0)
MCH: 30.4 pg (ref 26.0–34.0)
MCHC: 34.3 g/dL (ref 32.0–36.0)
MCV: 88.7 fL (ref 80.0–100.0)
Platelets: 152 10*3/uL (ref 150–440)
RBC: 3.85 MIL/uL — ABNORMAL LOW (ref 4.40–5.90)
RDW: 13.4 % (ref 11.5–14.5)
WBC: 15.4 10*3/uL — ABNORMAL HIGH (ref 3.8–10.6)

## 2016-12-02 LAB — PATHOLOGY REPORT

## 2016-12-02 NOTE — Telephone Encounter (Signed)
Pt wife called stating pt has been admitted to the hospital with sepsis post prostate bx. Wife requested WBC results. Made wife aware the last blood test [facility] performed was the PSA on 11/11/16. Made wife aware blood test are usually not performed the day of procedures. Wife voiced understanding.

## 2016-12-02 NOTE — Progress Notes (Signed)
PHARMACY - PHYSICIAN COMMUNICATION CRITICAL VALUE ALERT - BLOOD CULTURE IDENTIFICATION (BCID)  Results for orders placed or performed during the hospital encounter of 12/01/16  Blood Culture ID Panel (Reflexed) (Collected: 12/01/2016  1:57 PM)  Result Value Ref Range   Enterococcus species NOT DETECTED NOT DETECTED   Listeria monocytogenes NOT DETECTED NOT DETECTED   Staphylococcus species NOT DETECTED NOT DETECTED   Staphylococcus aureus NOT DETECTED NOT DETECTED   Streptococcus species NOT DETECTED NOT DETECTED   Streptococcus agalactiae NOT DETECTED NOT DETECTED   Streptococcus pneumoniae NOT DETECTED NOT DETECTED   Streptococcus pyogenes NOT DETECTED NOT DETECTED   Acinetobacter baumannii NOT DETECTED NOT DETECTED   Enterobacteriaceae species DETECTED (A) NOT DETECTED   Enterobacter cloacae complex NOT DETECTED NOT DETECTED   Escherichia coli DETECTED (A) NOT DETECTED   Klebsiella oxytoca NOT DETECTED NOT DETECTED   Klebsiella pneumoniae NOT DETECTED NOT DETECTED   Proteus species NOT DETECTED NOT DETECTED   Serratia marcescens NOT DETECTED NOT DETECTED   Carbapenem resistance NOT DETECTED NOT DETECTED   Haemophilus influenzae NOT DETECTED NOT DETECTED   Neisseria meningitidis NOT DETECTED NOT DETECTED   Pseudomonas aeruginosa NOT DETECTED NOT DETECTED   Candida albicans NOT DETECTED NOT DETECTED   Candida glabrata NOT DETECTED NOT DETECTED   Candida krusei NOT DETECTED NOT DETECTED   Candida parapsilosis NOT DETECTED NOT DETECTED   Candida tropicalis NOT DETECTED NOT DETECTED    Name of physician (or Provider) Contacted: Pyreddy  Changes to prescribed antibiotics required: No - Dr. Estanislado Pandy prefers to leave as ceftriaxone until full culture results.  Laural Benes, Pharm.D., BCPS Clinical Pharmacist 12/02/2016  5:45 AM

## 2016-12-02 NOTE — Progress Notes (Signed)
[facility] NAME: Albert Wade    [name]#:  [id number]  DATE OF BIRTH:  1946/02/09  SUBJECTIVE:  Hospital Day: 1 day Albert Wade is a 70 y.o. male presenting with Fever and Post-op Problem .   Overnight events: No acute overnight events Interval Events: No complaints his morning, wife at bedside  REVIEW OF SYSTEMS:  CONSTITUTIONAL: No fever, fatigue or weakness.  EYES: No blurred or double vision.  EARS, NOSE, AND THROAT: No tinnitus or ear pain.  RESPIRATORY: No cough, shortness of breath, wheezing or hemoptysis.  CARDIOVASCULAR: No chest pain, orthopnea, edema.  GASTROINTESTINAL: No nausea, vomiting, diarrhea or abdominal pain.  GENITOURINARY: No dysuria, hematuria.  ENDOCRINE: No polyuria, nocturia,  HEMATOLOGY: No anemia, easy bruising or bleeding SKIN: No rash or lesion. MUSCULOSKELETAL: No joint pain or arthritis.   NEUROLOGIC: No tingling, numbness, weakness.  PSYCHIATRY: No anxiety or depression.   DRUG ALLERGIES:   Allergies  Allergen Reactions  . Seroquel [Quetiapine] Hives    VITALS:  Blood pressure 130/62, pulse 83, temperature 97.5 F (36.4 C), temperature source Oral, resp. rate 17, height 5\' 10"  (1.778 m), weight 97.5 kg (215 lb), SpO2 94 %.  PHYSICAL EXAMINATION:  VITAL SIGNS: Vitals:   12/02/16 0436 12/02/16 0800  BP: (!) 124/53 130/62  Pulse: 79 83  Resp: 20 17  Temp: 99.5 F (37.5 C) 97.5 F (36.4 C)   GENERAL:70 y.o.male currently in no acute distress.  HEAD: Normocephalic, atraumatic.  EYES: Pupils equal, round, reactive to light. Extraocular muscles intact. No scleral icterus.  MOUTH: Moist mucosal membrane. Dentition intact. No abscess noted.  EAR, NOSE, THROAT: Clear without exudates. No external lesions.  NECK: Supple. No thyromegaly. No nodules. No JVD.  PULMONARY: Clear to ascultation, without wheeze rails or rhonci. No use of accessory muscles, Good respiratory effort.  good air entry bilaterally CHEST: Nontender to palpation.  CARDIOVASCULAR: S1 and S2. Regular rate and rhythm. No murmurs, rubs, or gallops. No edema. Pedal pulses 2+ bilaterally.  GASTROINTESTINAL: Soft, nontender, nondistended. No masses. Positive bowel sounds. No hepatosplenomegaly.  MUSCULOSKELETAL: No swelling, clubbing, or edema. Range of motion full in all extremities.  NEUROLOGIC: Cranial nerves II through XII are intact. No gross focal neurological deficits. Sensation intact. Reflexes intact.  SKIN: No ulceration, lesions, rashes, or cyanosis. Skin warm and dry. Turgor intact.  PSYCHIATRIC: Mood, affect within normal limits. The patient is awake, alert and oriented x 3. Insight, judgment intact.      LABORATORY PANEL:   CBC  Recent Labs Lab 12/02/16 0453  WBC 15.4*  HGB 11.7*  HCT 34.2*  PLT 152   ------------------------------------------------------------------------------------------------------------------  Chemistries   Recent Labs Lab 12/01/16 0951 12/02/16 0453  NA 136 136  K 4.2 3.6  CL 99* 103  CO2 28 28  GLUCOSE 112* 115*  BUN 9 9  CREATININE 1.06 0.95  CALCIUM 8.7* 7.9*  AST 31  --   ALT 18  --   ALKPHOS 67  --   BILITOT 1.3*  --    ------------------------------------------------------------------------------------------------------------------  Cardiac Enzymes No results for input(s): TROPONINI in the last 168 hours. ------------------------------------------------------------------------------------------------------------------  RADIOLOGY:  No results found.  EKG:   Orders placed or performed in visit on 03/04/16  . EKG 12-Lead    ASSESSMENT AND PLAN:   Albert Wade is a 70 y.o. male presenting with Fever and Post-op Problem . Admitted 12/01/2016 : Day #: 1 day 1. Sepsis: Present on admission patient is bacteremic  with Escherichia coli and Enterobacter based off of BCID, follow culture data, adjust antibiotics accordingly, will  need 10-14 days with antibiotics 2. Hyperlipidemia unspecified: Statin therapy 3. BPH unspecified: Flomax   All the records are reviewed and case discussed with Care Management/Social Workerr. Management plans discussed with the patient, family and they are in agreement.  CODE STATUS: full TOTAL TIME TAKING CARE OF THIS PATIENT: 28 minutes.   POSSIBLE D/C IN 1-2DAYS, DEPENDING ON CLINICAL CONDITION.   Shandreka Dante,  Karenann Cai.D on 12/02/2016 at 12:00 PM  Between 7am to 6pm - Pager - [phone]  After 6pm: House Pager: - [phone]  [facility]  Office  [phone]  CC: Primary care physician; Cathlean Cower, MD

## 2016-12-02 NOTE — Care Management (Signed)
Patient admitted from home with sepsis related to UTI.  Lives at home with wife.  PCP Cathlean Cower.  [place].  Reported that patient has been up in the room.  Patient to ambulate in the hall today.  No RNCM needs identified at this time.

## 2016-12-02 NOTE — Progress Notes (Signed)
PHARMACY - PHYSICIAN COMMUNICATION CRITICAL VALUE ALERT - BLOOD CULTURE IDENTIFICATION (BCID)  Results for orders placed or performed during the hospital encounter of 12/01/16  Blood Culture ID Panel (Reflexed) (Collected: 12/01/2016  1:57 PM)  Result Value Ref Range   Enterococcus species NOT DETECTED NOT DETECTED   Listeria monocytogenes NOT DETECTED NOT DETECTED   Staphylococcus species DETECTED (A) NOT DETECTED   Staphylococcus aureus NOT DETECTED NOT DETECTED   Methicillin resistance NOT DETECTED NOT DETECTED   Streptococcus species NOT DETECTED NOT DETECTED   Streptococcus agalactiae NOT DETECTED NOT DETECTED   Streptococcus pneumoniae NOT DETECTED NOT DETECTED   Streptococcus pyogenes NOT DETECTED NOT DETECTED   Acinetobacter baumannii NOT DETECTED NOT DETECTED   Enterobacteriaceae species NOT DETECTED NOT DETECTED   Enterobacter cloacae complex NOT DETECTED NOT DETECTED   Escherichia coli NOT DETECTED NOT DETECTED   Klebsiella oxytoca NOT DETECTED NOT DETECTED   Klebsiella pneumoniae NOT DETECTED NOT DETECTED   Proteus species NOT DETECTED NOT DETECTED   Serratia marcescens NOT DETECTED NOT DETECTED   Haemophilus influenzae NOT DETECTED NOT DETECTED   Neisseria meningitidis NOT DETECTED NOT DETECTED   Pseudomonas aeruginosa NOT DETECTED NOT DETECTED   Candida albicans NOT DETECTED NOT DETECTED   Candida glabrata NOT DETECTED NOT DETECTED   Candida krusei NOT DETECTED NOT DETECTED   Candida parapsilosis NOT DETECTED NOT DETECTED   Candida tropicalis NOT DETECTED NOT DETECTED    Name of physician (or Provider) Contacted: Sainani  Changes to prescribed antibiotics required: No, will continue pt on Ceftriaxone 1 gm IV Q24H.   Sharron Petruska D 12/02/2016  8:32 PM

## 2016-12-03 MED ORDER — CEFTRIAXONE SODIUM-DEXTROSE 2-2.22 GM-% IV SOLR
2.0000 g | INTRAVENOUS | Status: DC
  Filled 2016-12-03: qty 50

## 2016-12-03 MED ORDER — CEFTRIAXONE SODIUM-DEXTROSE 1-3.74 GM-% IV SOLR
1.0000 g | Freq: Once | INTRAVENOUS | Status: AC
  Administered 2016-12-03: 1 g via INTRAVENOUS
  Filled 2016-12-03: qty 50

## 2016-12-03 NOTE — Progress Notes (Signed)
Pharmacy antibiotic dose adjustment:  Patient currently ordered ceftriaxone 1 g IV daily, but BCID identified E. Coli in the blood.   Will give another 1 g dose today and increase dose to ceftriaxone 2 g IV daily starting tomorrow.  Lenis Noon, PharmD, BCPS 12/03/16 1:47 PM

## 2016-12-03 NOTE — Progress Notes (Signed)
[facility] [place] NAME: Albert Wade    MR#:  [id number]  DATE OF BIRTH:  06/23/1946  SUBJECTIVE:   Patient is here due to sepsis secondary to UTI. Blood cultures are positive for Escherichia coli but sensitivities are pending. Currently afebrile and hemodynamically stable. Wife at bedside.  No acute events overnight.   REVIEW OF SYSTEMS:    Review of Systems  Constitutional: Negative for chills and fever.  HENT: Negative for congestion and tinnitus.   Eyes: Negative for blurred vision and double vision.  Respiratory: Negative for cough, shortness of breath and wheezing.   Cardiovascular: Negative for chest pain, orthopnea and PND.  Gastrointestinal: Negative for abdominal pain, diarrhea, nausea and vomiting.  Genitourinary: Negative for dysuria and hematuria.  Neurological: Negative for dizziness, sensory change and focal weakness.  All other systems reviewed and are negative.   Nutrition: Regular Tolerating Diet: Yes Tolerating PT: Ambulatory     DRUG ALLERGIES:   Allergies  Allergen Reactions  . Seroquel [Quetiapine] Hives    VITALS:  Blood pressure 130/67, pulse (!) 59, temperature 98 F (36.7 C), temperature source Oral, resp. rate 13, height _0  (1.778 m), weight 97.5 kg (215 lb), SpO2 99 %.  PHYSICAL EXAMINATION:   Physical Exam  GENERAL:  70 y.o.-year-old obese patient lying in the bed in no acute distress.  EYES: Pupils equal, round, reactive to light and accommodation. No scleral icterus. Extraocular muscles intact.  HEENT: Head atraumatic, normocephalic. Oropharynx and nasopharynx clear.  NECK:  Supple, no jugular venous distention. No thyroid enlargement, no tenderness.  LUNGS: Normal breath sounds bilaterally, no wheezing, rales, rhonchi. No use of accessory muscles of respiration.  CARDIOVASCULAR: S1, S2 normal. No murmurs, rubs, or gallops.  ABDOMEN: Soft, nontender, nondistended. Bowel sounds present.  No organomegaly or mass.  EXTREMITIES: No cyanosis, clubbing or edema b/l.    NEUROLOGIC: Cranial nerves II through XII are intact. No focal Motor or sensory deficits b/l.   PSYCHIATRIC: The patient is alert and oriented x 3.  SKIN: No obvious rash, lesion, or ulcer.    LABORATORY PANEL:   CBC  Recent Labs Lab 12/02/16 0453  WBC 15.4*  HGB 11.7*  HCT 34.2*  PLT 152   ------------------------------------------------------------------------------------------------------------------  Chemistries   Recent Labs Lab 12/01/16 0951 12/02/16 0453  NA 136 136  K 4.2 3.6  CL 99* 103  CO2 28 28  GLUCOSE 112* 115*  BUN 9 9  CREATININE 1.06 0.95  CALCIUM 8.7* 7.9*  AST 31  --   ALT 18  --   ALKPHOS 67  --   BILITOT 1.3*  --    ------------------------------------------------------------------------------------------------------------------  Cardiac Enzymes No results for input(s): TROPONINI in the last 168 hours. ------------------------------------------------------------------------------------------------------------------  RADIOLOGY:  No results found.   ASSESSMENT AND PLAN:   70 year old male with past medical history of coronary artery disease status post MI, BPH, chronic back pain, neuropathy, hypertension, hyperlipidemia, history of umbilical hernia who presented to the hospital due to fever or chills headache and noted to have a urinary tract infection.  1. Sepsis-patient met admission criteria with fever, leukocytosis and abnormal urinalysis.  - due to urinary tract infection. Cont. IV Ceftriaxone and urine cultures growing 100,000 of gram (-) rod but ID pending.  - currently afebrile and hemodynamically stable.   2. Urinary tract infection-source of patient's sepsis. - urine culture growing gram (-) rod but ID pending.  - cont. Ceftriaxone.    3. Chronic back pain-continue  methadone, Percocet as needed.  4. Anxiety/depression-continue Xanax,  Cymbalta.  5. BPH-continue Flomax.  6. Hyperlipidemia-continue simvastatin.  7. History of coronary artery disease-no acute chest pain or acute symptoms. -Continue aspirin, statin.  D/c home tomorrow once cultures are identified.    All the records are reviewed and case discussed with Care Management/Social Worker. Management plans discussed with the patient, family and they are in agreement.  CODE STATUS: Full code  DVT Prophylaxis: Lovenox  TOTAL TIME TAKING CARE OF THIS PATIENT: 25  minutes.   POSSIBLE D/C IN 1-2 DAYS, DEPENDING ON CLINICAL CONDITION.   Henreitta Leber M.D on 12/03/2016 at 1:18 PM  Between 7am to 6pm - Pager - [phone]  After 6pm go to [email or web address] - Proofreader  [facility]  [facility]  Office  [phone]  CC: Primary care physician; Cathlean Cower, MD

## 2016-12-04 ENCOUNTER — Inpatient Hospital Stay: Payer: Medicare HMO

## 2016-12-04 LAB — CBC
HCT: 34.7 % — ABNORMAL LOW (ref 40.0–52.0)
Hemoglobin: 11.9 g/dL — ABNORMAL LOW (ref 13.0–18.0)
MCH: 30.8 pg (ref 26.0–34.0)
MCHC: 34.3 g/dL (ref 32.0–36.0)
MCV: 89.6 fL (ref 80.0–100.0)
Platelets: 191 10*3/uL (ref 150–440)
RBC: 3.87 MIL/uL — ABNORMAL LOW (ref 4.40–5.90)
RDW: 13.9 % (ref 11.5–14.5)
WBC: 9.2 10*3/uL (ref 3.8–10.6)

## 2016-12-04 LAB — CULTURE, BLOOD (ROUTINE X 2)

## 2016-12-04 MED ORDER — MEROPENEM-SODIUM CHLORIDE 1 GM/50ML IV SOLR
1.0000 g | Freq: Three times a day (TID) | INTRAVENOUS | Status: DC
  Administered 2016-12-04 – 2016-12-05 (×4): 1 g via INTRAVENOUS
  Filled 2016-12-04 (×6): qty 50

## 2016-12-04 MED ORDER — MEROPENEM-SODIUM CHLORIDE 1 GM/50ML IV SOLR: 1.0000 g | Freq: Three times a day (TID) | INTRAVENOUS | Status: DC

## 2016-12-04 MED ORDER — SODIUM CHLORIDE 0.9 % IV SOLN
1.0000 g | Freq: Three times a day (TID) | INTRAVENOUS | Status: DC
  Filled 2016-12-04 (×2): qty 1

## 2016-12-04 NOTE — Progress Notes (Signed)
[facility] [place] NAME: Albert Wade    MR#:  [id number]  DATE OF BIRTH:  1946/04/09  SUBJECTIVE:   Patient is here due to sepsis secondary to UTI.  Blood cultures came back positive for ESBL today. Clinically remains asymptomatic and currently afebrile and hemodynamically stable. White cell count has normalized.  REVIEW OF SYSTEMS:    Review of Systems  Constitutional: Negative for chills and fever.  HENT: Negative for congestion and tinnitus.   Eyes: Negative for blurred vision and double vision.  Respiratory: Negative for cough, shortness of breath and wheezing.   Cardiovascular: Negative for chest pain, orthopnea and PND.  Gastrointestinal: Negative for abdominal pain, diarrhea, nausea and vomiting.  Genitourinary: Negative for dysuria and hematuria.  Neurological: Negative for dizziness, sensory change and focal weakness.  All other systems reviewed and are negative.   Nutrition: Regular Tolerating Diet: Yes Tolerating PT: Ambulatory     DRUG ALLERGIES:   Allergies  Allergen Reactions  . Seroquel [Quetiapine] Hives    VITALS:  Blood pressure 127/65, pulse 67, temperature 98.4 F (36.9 C), temperature source Oral, resp. rate 16, height '5\' 10"'  (1.778 m), weight 97.5 kg (215 lb), SpO2 96 %.  PHYSICAL EXAMINATION:   Physical Exam  GENERAL:  70 y.o.-year-old obese patient sitting up in bed in no acute distress.  EYES: Pupils equal, round, reactive to light and accommodation. No scleral icterus. Extraocular muscles intact.  HEENT: Head atraumatic, normocephalic. Oropharynx and nasopharynx clear.  NECK:  Supple, no jugular venous distention. No thyroid enlargement, no tenderness.  LUNGS: Normal breath sounds bilaterally, no wheezing, rales, rhonchi. No use of accessory muscles of respiration.  CARDIOVASCULAR: S1, S2 normal. No murmurs, rubs, or gallops.  ABDOMEN: Soft, nontender, nondistended. Bowel sounds present. No  organomegaly or mass.  EXTREMITIES: No cyanosis, clubbing or edema b/l.    NEUROLOGIC: Cranial nerves II through XII are intact. No focal Motor or sensory deficits b/l.   PSYCHIATRIC: The patient is alert and oriented x 3.  SKIN: No obvious rash, lesion, or ulcer.    LABORATORY PANEL:   CBC  Recent Labs Lab 12/04/16 0531  WBC 9.2  HGB 11.9*  HCT 34.7*  PLT 191   ------------------------------------------------------------------------------------------------------------------  Chemistries   Recent Labs Lab 12/01/16 0951 12/02/16 0453  NA 136 136  K 4.2 3.6  CL 99* 103  CO2 28 28  GLUCOSE 112* 115*  BUN 9 9  CREATININE 1.06 0.95  CALCIUM 8.7* 7.9*  AST 31  --   ALT 18  --   ALKPHOS 67  --   BILITOT 1.3*  --    ------------------------------------------------------------------------------------------------------------------  Cardiac Enzymes No results for input(s): TROPONINI in the last 168 hours. ------------------------------------------------------------------------------------------------------------------  RADIOLOGY:  No results found.   ASSESSMENT AND PLAN:   70 year old male with past medical history of coronary artery disease status post MI, BPH, chronic back pain, neuropathy, hypertension, hyperlipidemia, history of umbilical hernia who presented to the hospital due to fever or chills headache and noted to have a urinary tract infection.  1. Sepsis-patient met admission criteria with fever, leukocytosis and abnormal urinalysis.  - due to urinary tract infection. BC + for ESBL.  Taken off Ceftriaxone and started on Meropenem.  - remains afebrile and hemodynamically stable.  - will place PICC Line for 10-11 more days of IV abx. Discussed w/ Dr. Ola Spurr over the phone.  He will see pt. Tomorrow. Will repeat BC today.   2. Urinary tract infection-source  of patient's sepsis. Due to ESBL - switched IV Ceftriaxone to IV Meropenem  3. Chronic back  pain-continue methadone, Percocet as needed.  4. Anxiety/depression-continue Xanax, Cymbalta.  5. BPH-continue Flomax.  6. Hyperlipidemia-continue simvastatin.  7. History of coronary artery disease-no acute chest pain or acute symptoms. -Continue aspirin, statin.  Likely d/c home tomorrow after PICC line placed and seen by ID and Home abx arranged. Will need Home health nursing upon discharged for IV abx.   All the records are reviewed and case discussed with Care Management/Social Worker. Management plans discussed with the patient, family and they are in agreement.  CODE STATUS: Full code  DVT Prophylaxis: Lovenox  TOTAL TIME TAKING CARE OF THIS PATIENT: 30 minutes.   POSSIBLE D/C IN 1-2 DAYS, DEPENDING ON CLINICAL CONDITION.   Henreitta Leber M.D on 12/04/2016 at 12:05 PM  Between 7am to 6pm - Pager - [phone]  After 6pm go to [email or web address] - Proofreader  [facility]  Office  [phone]  CC: Primary care physician; Cathlean Cower, MD

## 2016-12-04 NOTE — Progress Notes (Signed)
Pharmacy Antibiotic Note  Albert Wade is a 70 y.o. male admitted on 12/01/2016 with ESBL Ecoli blood culture.  Pharmacy has been consulted for meropenem dosing. Patient began receiving ceftriaxone 2 g yesterday.  Plan: Will transition patient from ceftriaxone 2 g q 24 hours to meropenem 1 g q 8 hours.   Height: 5\' 10"  (177.8 cm) Weight: 215 lb (97.5 kg) IBW/kg (Calculated) : 73  Temp (24hrs), Avg:98.5 F (36.9 C), Min:98.2 F (36.8 C), Max:99 F (37.2 C)   Recent Labs Lab 12/01/16 0951 12/01/16 1357 12/02/16 0453 12/04/16 0531  WBC 18.3*  --  15.4* 9.2  CREATININE 1.06  --  0.95  --   LATICACIDVEN  --  1.7  --   --     Estimated Creatinine Clearance: 84.7 mL/min (by C-G formula based on SCr of 0.95 mg/dL).    Allergies  Allergen Reactions  . Seroquel [Quetiapine] Hives    Antimicrobials this admission: ceftriaxone 12/7 >> 12/10 Meropenem 12/10 >>   Dose adjustments this admission: Ceftriaxone 1 g -> ceftriaxone 2 g -> meropenem  Microbiology results: 12/7 BCx: ESBL Ecoli; GPC  UCx:   Sputum:   MRSA PCR:   Thank you for allowing pharmacy to be a part of this patient's care.  Darrow Bussing, PharmD Pharmacy Resident 12/04/2016 9:09 AM

## 2016-12-04 NOTE — Progress Notes (Signed)
Per MD request, patient was updated that blood cultures are positive for ESBL. Printed information on ESBL was provided to him. Patient is now on contact isolation and purpose was also explained to him as well.

## 2016-12-05 ENCOUNTER — Other Ambulatory Visit: Payer: Self-pay | Admitting: Urology

## 2016-12-05 LAB — CREATININE, SERUM
Creatinine, Ser: 0.76 mg/dL (ref 0.61–1.24)
GFR calc Af Amer: >60 mL/min
GFR calc non Af Amer: >60 mL/min

## 2016-12-05 MED ORDER — SENNOSIDES-DOCUSATE SODIUM 8.6-50 MG PO TABS
1.0000 | ORAL_TABLET | Freq: Two times a day (BID) | ORAL | Status: DC
  Administered 2016-12-05: 1 via ORAL
  Filled 2016-12-05: qty 1

## 2016-12-05 MED ORDER — SODIUM CHLORIDE 0.9 % IV SOLN: 1.0000 g | INTRAVENOUS | 0 refills | Status: AC

## 2016-12-05 MED ORDER — SODIUM CHLORIDE 0.9 % IV SOLN
1.0000 g | Freq: Once | INTRAVENOUS | Status: AC
  Administered 2016-12-05: 1 g via INTRAVENOUS
  Filled 2016-12-05: qty 1

## 2016-12-05 NOTE — Progress Notes (Signed)
Infectious Disease Long Term IV Antibiotic Orders  Diagnosis:ESBL E coli bacteremia  Culture results Results for orders placed or performed during the hospital encounter of 12/01/16  Culture, blood (routine x 2)     Status: None (Preliminary result)   Collection Time: 12/01/16  9:51 AM  Result Value Ref Range Status   Specimen Description BLOOD LEFT AC  Final   Special Requests   Final    BOTTLES DRAWN AEROBIC AND ANAEROBIC AER 12ML ANA 10ML   Culture NO GROWTH 4 DAYS  Final   Report Status PENDING  Incomplete  Culture, blood (routine x 2)     Status: Abnormal   Collection Time: 12/01/16  1:57 PM  Result Value Ref Range Status   Specimen Description BLOOD LEFT HAND  Final   Special Requests   Final    BOTTLES DRAWN AEROBIC AND ANAEROBIC AER 7ML ANA 6ML   Culture  Setup Time   Final    GRAM NEGATIVE RODS AEROBIC BOTTLE ONLY CRITICAL RESULT CALLED TO, READ BACK BY AND VERIFIED WITH: NATE COOKSON AT 0542 ON 12/02/16 [place]. GRAM POSITIVE COCCI ANAEROBIC BOTTLE ONLY CRITICAL RESULT CALLED TO, READ BACK BY AND VERIFIED WITH: JASON ROBBINS AT 2027 12/02/16.[facility]    Culture (A)  Final    ESCHERICHIA COLI Confirmed Extended Spectrum Beta-Lactamase Producer (ESBL) STAPHYLOCOCCUS SPECIES (COAGULASE NEGATIVE) THE SIGNIFICANCE OF ISOLATING THIS ORGANISM FROM A SINGLE SET OF BLOOD CULTURES WHEN MULTIPLE SETS ARE DRAWN IS UNCERTAIN. PLEASE NOTIFY THE MICROBIOLOGY DEPARTMENT WITHIN ONE WEEK IF SPECIATION AND SENSITIVITIES ARE REQUIRED. Performed at [facility]    Report Status 12/04/2016 FINAL  Final   Organism ID, Bacteria ESCHERICHIA COLI  Final      Susceptibility   Escherichia coli - MIC*    AMPICILLIN >=32 RESISTANT Resistant     CEFAZOLIN >=64 RESISTANT Resistant     CEFEPIME 8 SENSITIVE Sensitive     CEFTAZIDIME 16 INTERMEDIATE Intermediate     CEFTRIAXONE >=64 RESISTANT Resistant     CIPROFLOXACIN >=4 RESISTANT Resistant     GENTAMICIN <=1 SENSITIVE Sensitive     IMIPENEM  <=0.25 SENSITIVE Sensitive     TRIMETH/SULFA >=320 RESISTANT Resistant     AMPICILLIN/SULBACTAM >=32 RESISTANT Resistant     PIP/TAZO 8 SENSITIVE Sensitive     Extended ESBL POSITIVE Resistant     * ESCHERICHIA COLI  Blood Culture ID Panel (Reflexed)     Status: Abnormal   Collection Time: 12/01/16  1:57 PM  Result Value Ref Range Status   Enterococcus species NOT DETECTED NOT DETECTED Final   Listeria monocytogenes NOT DETECTED NOT DETECTED Final   Staphylococcus species NOT DETECTED NOT DETECTED Final   Staphylococcus aureus NOT DETECTED NOT DETECTED Final   Streptococcus species NOT DETECTED NOT DETECTED Final   Streptococcus agalactiae NOT DETECTED NOT DETECTED Final   Streptococcus pneumoniae NOT DETECTED NOT DETECTED Final   Streptococcus pyogenes NOT DETECTED NOT DETECTED Final   Acinetobacter baumannii NOT DETECTED NOT DETECTED Final   Enterobacteriaceae species DETECTED (A) NOT DETECTED Final    Comment: CRITICAL RESULT CALLED TO, READ BACK BY AND VERIFIED WITH: NATE COOKSON AT 0542 ON 12/02/16 [place].    Enterobacter cloacae complex NOT DETECTED NOT DETECTED Final   Escherichia coli DETECTED (A) NOT DETECTED Final    Comment: CRITICAL RESULT CALLED TO, READ BACK BY AND VERIFIED WITH: NATE COOKSON AT 0542 ON 12/02/16 [place].    Klebsiella oxytoca NOT DETECTED NOT DETECTED Final   Klebsiella pneumoniae NOT DETECTED NOT DETECTED Final  Proteus species NOT DETECTED NOT DETECTED Final   Serratia marcescens NOT DETECTED NOT DETECTED Final   Carbapenem resistance NOT DETECTED NOT DETECTED Final   Haemophilus influenzae NOT DETECTED NOT DETECTED Final   Neisseria meningitidis NOT DETECTED NOT DETECTED Final   Pseudomonas aeruginosa NOT DETECTED NOT DETECTED Final   Candida albicans NOT DETECTED NOT DETECTED Final   Candida glabrata NOT DETECTED NOT DETECTED Final   Candida krusei NOT DETECTED NOT DETECTED Final   Candida parapsilosis NOT DETECTED NOT DETECTED Final   Candida  tropicalis NOT DETECTED NOT DETECTED Final  Blood Culture ID Panel (Reflexed)     Status: Abnormal   Collection Time: 12/01/16  1:57 PM  Result Value Ref Range Status   Enterococcus species NOT DETECTED NOT DETECTED Final   Listeria monocytogenes NOT DETECTED NOT DETECTED Final   Staphylococcus species DETECTED (A) NOT DETECTED Final    Comment: CRITICAL RESULT CALLED TO, READ BACK BY AND VERIFIED WITH: JASON ROBBINS AT 2027 12/02/16.[facility]    Staphylococcus aureus NOT DETECTED NOT DETECTED Final   Methicillin resistance NOT DETECTED NOT DETECTED Final   Streptococcus species NOT DETECTED NOT DETECTED Final   Streptococcus agalactiae NOT DETECTED NOT DETECTED Final   Streptococcus pneumoniae NOT DETECTED NOT DETECTED Final   Streptococcus pyogenes NOT DETECTED NOT DETECTED Final   Acinetobacter baumannii NOT DETECTED NOT DETECTED Final   Enterobacteriaceae species NOT DETECTED NOT DETECTED Final   Enterobacter cloacae complex NOT DETECTED NOT DETECTED Final   Escherichia coli NOT DETECTED NOT DETECTED Final   Klebsiella oxytoca NOT DETECTED NOT DETECTED Final   Klebsiella pneumoniae NOT DETECTED NOT DETECTED Final   Proteus species NOT DETECTED NOT DETECTED Final   Serratia marcescens NOT DETECTED NOT DETECTED Final   Haemophilus influenzae NOT DETECTED NOT DETECTED Final   Neisseria meningitidis NOT DETECTED NOT DETECTED Final   Pseudomonas aeruginosa NOT DETECTED NOT DETECTED Final   Candida albicans NOT DETECTED NOT DETECTED Final   Candida glabrata NOT DETECTED NOT DETECTED Final   Candida krusei NOT DETECTED NOT DETECTED Final   Candida parapsilosis NOT DETECTED NOT DETECTED Final   Candida tropicalis NOT DETECTED NOT DETECTED Final  CULTURE, BLOOD (ROUTINE X 2) w Reflex to ID Panel     Status: None (Preliminary result)   Collection Time: 12/04/16  8:59 AM  Result Value Ref Range Status   Specimen Description BLOOD RIGHT ASSIST CONTROL  Final   Special Requests   Final     BOTTLES DRAWN AEROBIC AND ANAEROBIC  AEROBIC [place], ANAEROBIC 6CC   Culture NO GROWTH < 24 HOURS  Final   Report Status PENDING  Incomplete  CULTURE, BLOOD (ROUTINE X 2) w Reflex to ID Panel     Status: None (Preliminary result)   Collection Time: 12/04/16  9:11 AM  Result Value Ref Range Status   Specimen Description BLOOD RIGHT HAND  Final   Special Requests   Final    BOTTLES DRAWN AEROBIC AND ANAEROBIC  AEROBIC 8CC, ANAEROBIC 6CC   Culture NO GROWTH < 24 HOURS  Final   Report Status PENDING  Incomplete     Allergies:  Allergies  Allergen Reactions  . Seroquel [Quetiapine] Hives    Discharge antibiotics Ertapenem       1 grmas every  24 hours  PICC Care per protocol Labs weekly while on IV antibiotics      CBC w diff   Comprehensive met panel  Planned duration of antibiotics 10 days from admission  Stop date 12/14/16  Follow up clinic date  No need to see me FAX weekly labs to [phone]  Leonel Ramsay, MD

## 2016-12-05 NOTE — Discharge Instructions (Signed)
Bacteremia Introduction Bacteremia is the presence of bacteria in the blood. A small amount of bacteria may not cause any symptoms. Sometimes, the bacteria spread and cause infection in other parts of the body, such as the heart, joints, bones, or brain. Having a great amount of bacteria can cause a serious, sometimes life-threatening infection called sepsis. What are the causes? This condition is caused by bacteria that get into the blood. Bacteria can enter the blood:  During a dental or medical procedure.  After you brush your teeth so hard that the gums bleed.  Through a scrape or cut on your skin. More severe types of bacteremia can be caused by:  A bacterial infection, such as pneumonia, that spreads to the blood.  Using a dirty needle. What increases the risk? This condition is more likely to develop in:  Children and elderly adults.  People who have a long-lasting (chronic) disease or medical condition.  People who have an artificial joint or heart valve.  People who have heart valve disease.  People who have a tube, such as a catheter or IV tube, that has been inserted for a medical treatment.  People who have a weak body defense system (immune system).  People who use IV drugs. What are the signs or symptoms? Usually, this condition does not cause symptoms when it is mild. When it is more serious, it may cause:  Fever.  Chills.  Racing heart.  Shortness of breath.  Dizziness.  Weakness.  Confusion.  Nausea or vomiting.  Diarrhea. Bacteremia that has spread to other parts of the body may cause symptoms in those areas. How is this diagnosed? This condition may be diagnosed with a physical exam and tests, such as:  A complete blood count (CBC). This test looks for signs of infection.  Blood cultures. These look for bacteria in your blood.  Tests of any IV tubes. These look for a source of infection.  Urine tests.  Imaging tests, such as an  X-ray, CT scan, MRI, or heart ultrasound. How is this treated? If the condition is mild, treatment is usually not needed. Usually, the bodys immune system will remove the bacteria. If the condition is more serious, it may be treated with:  Antibiotic medicines through an IV tube. These may be given for about 2 weeks. At first, the antibiotic that is given may kill most types of blood bacteria. If your test results show that a certain kind of bacteria is causing problems, the antibiotic may be changed to kill only the bacteria that are causing problems.  Antibiotics taken by mouth.  Removing any catheter or IV tube that is a source of infection.  Blood pressure and breathing support, if needed.  Surgery to control the source or spread of infection, if needed. Follow these instructions at home:  Take over-the-counter and prescription medicines only as told by your health care provider.  If you were prescribed an antibiotic, take it as told by your health care provider. Do not stop taking the antibiotic even if you start to feel better.  Rest at home until your condition is under control.  Drink enough fluid to keep your urine clear or pale yellow.  Keep all follow-up visits as told by your health care provider. This is important. How is this prevented? Take these actions to help prevent future episodes of bacteremia:  Get all vaccinations as recommended by your health care provider.  Clean and cover scrapes or cuts.  Bathe regularly.  Wash your  hands often.  Before any dental or surgical procedure, ask your health care provider if you should take an antibiotic. Contact a health care provider if:  Your symptoms get worse.  You continue to have symptoms after treatment.  You develop new symptoms after treatment. Get help right away if:  You have chest pain or trouble breathing.  You develop confusion, dizziness, or weakness.  You develop pale skin. This information is  not intended to replace advice given to you by your health care provider. Make sure you discuss any questions you have with your health care provider. Document Released: 09/25/2006 Document Revised: 07/01/2016 Document Reviewed: 02/14/2015  2017 Elsevier

## 2016-12-05 NOTE — Care Management Important Message (Signed)
Important Message  Patient Details  Name: QUATAVIOUS GROEBER [name]: [id number] Date of Birth: [birth year]   Medicare Important Message Given:  Yes    Beverly Sessions, RN 12/05/2016, 4:23 PM

## 2016-12-05 NOTE — Care Management (Signed)
Patient to discharge home today.  Patient lives at home with wife.  PCP Cathlean Cower.  Patient independent at baseline.  Patient to discharge today on home IV antibiotics.  Agency preference was provided.  [facility] selected. Corene Cornea with [facility] notified. Patient to discharge on Ertapenem q24 hours.  Patient to receive first dose this evening, then [facility] to start of care tomorrow.  Patient and wife to learn to administer IV antibiotics.  RNCM signing off.

## 2016-12-05 NOTE — Progress Notes (Signed)
IV was removed. PICC line in place. Discharge instructions, follow-up appointments, and prescriptions were provided to the pt and wife at bedside. All questions answered. The pt wanted to walk down stairs and refused wheelchair. Pt was walking with wife.

## 2016-12-05 NOTE — Consult Note (Signed)
[facility] Infectious Disease     Reason for Consult: ESBL E coli bacteremia, UTI    Referring Physician: Verdell Carmine.V Date of Admission:  12/01/2016   Active Problems:   UTI (urinary tract infection)   HPI: Albert Wade is a 70 y.o. male who underwent prostate bxp on 12/5 for elevated PSA. He received a dose of gent and levo at time of procedure.  He was admitted 1/7 with fevers and chills and wbc 18.  He was started on IV abx and then [facility] came + for E coli. Clinically was improving but then cx identified as ESBL so changed to meropenem 12/10.   Currently denies fevers, chills, abd pain, nausea vomiting. He is anxious to go home.   Past Medical History:  Diagnosis Date  . Anxiety   . Arthritis    back,wrists,hx. previous fractures  . BPH (benign prostatic hypertrophy)   . CAD (coronary artery disease)    angioplasty   . Chronic back pain   . Chronic bronchitis   . Depression   . GERD (gastroesophageal reflux disease)   . H/O hiatal hernia    pts wife states pt does not have   . Heart murmur   . Hypercholesteremia   . Hypertension   . MI (myocardial infarction)    mid-'90's  . Peripheral neuropathy (HCC)    peripheral neuropathy  . Pneumonia   . Sleep apnea    mild-no cpap, unable to tolerate  . Umbilical hernia   . Varicose veins    Past Surgical History:  Procedure Laterality Date  . ANGIOPLASTY    . APPENDECTOMY    . BACK SURGERY     x3  . CARDIAC CATHETERIZATION     11'11  . CIRCUMCISION N/A 10/21/2013   Procedure: CIRCUMCISION ADULT;  Surgeon: Bernestine Amass, MD;  Location: [facility];  Service: Urology;  Laterality: N/A;  . CYSTOSCOPY N/A 10/21/2013   Procedure: CYSTOSCOPY FLEXIBLE;  Surgeon: Bernestine Amass, MD;  Location: [facility];  Service: Urology;  Laterality: N/A;  . INSERTION OF MESH N/A 06/17/2016   Procedure: INSERTION OF MESH;  Surgeon: Excell Seltzer, MD;  Location: [facility];  Service: General;  Laterality: N/A;  . UMBILICAL HERNIA REPAIR N/A  06/17/2016   Procedure: REPAIR UMBILICAL HERNIA WITH MESH;  Surgeon: Excell Seltzer, MD;  Location: [facility];  Service: General;  Laterality: N/A;  . WRIST SURGERY Right    Social History  Substance Use Topics  . Smoking status: Former Smoker    Packs/day: 0.50    Years: 40.00    Types: Cigarettes    Quit date: 12/27/1995  . Smokeless tobacco: Never Used  . Alcohol use No   Family History  Problem Relation Age of Onset  . Prostate cancer Father   . Emphysema Father   . Hyperlipidemia Father   . Hyperlipidemia Mother     Allergies:  Allergies  Allergen Reactions  . Seroquel [Quetiapine] Hives    Current antibiotics: Antibiotics Given (last 72 hours)    Date/Time Action Medication Dose Rate   12/03/16 1101 Given   cefTRIAXone (ROCEPHIN) IVPB 1 g 1 g 100 mL/hr   12/03/16 1458 Given   cefTRIAXone (ROCEPHIN) IVPB 1 g 1 g 100 mL/hr   12/04/16 1143 Given   meropenem (MERREM) IVPB SOLR 1 g 1 g 100 mL/hr   12/04/16 1922 Given   meropenem (MERREM) IVPB SOLR 1 g 1 g 100 mL/hr   12/05/16 0507 Given   meropenem (MERREM) IVPB  SOLR 1 g 1 g 100 mL/hr   12/05/16 1028 Given   meropenem (MERREM) IVPB SOLR 1 g 1 g 100 mL/hr      MEDICATIONS: . aspirin EC  81 mg Oral Daily  . atorvastatin  40 mg Oral Q0600  . DULoxetine  30 mg Oral Daily  . enoxaparin (LOVENOX) injection  40 mg Subcutaneous Q24H  . meropenem  1 g Intravenous Q8H  . methadone  10 mg Oral QID  . mometasone-formoterol  2 puff Inhalation BID  . senna-docusate  1 tablet Oral BID  . tamsulosin  0.4 mg Oral Daily    Review of Systems - 11 systems reviewed and negative per HPI   OBJECTIVE: Temp:  [97.4 F (36.3 C)-98.2 F (36.8 C)] 97.4 F (36.3 C) (12/11 1244) Pulse Rate:  [63-73] 63 (12/11 1244) Resp:  [16-18] 18 (12/11 1244) BP: (135-143)/(61-67) 141/61 (12/11 1244) SpO2:  [92 %-98 %] 98 % (12/11 1244)  Physical Exam  Constitutional: He is oriented to person, place, and time. He appears well-developed and  well-nourished. No distress.  HENT: anicteric Mouth/Throat: Oropharynx is clear and moist. No oropharyngeal exudate.  Cardiovascular: Normal rate, regular rhythm and normal heart sounds. Pulmonary/Chest: Effort normal and breath sounds normal. No respiratory distress. He has no wheezes.  Abdominal: Soft. Bowel sounds are normal. He exhibits no distension. There is no tenderness.  Lymphadenopathy:  He has no cervical adenopathy.  Neurological: He is alert and oriented to person, place, and time.  Skin: Skin is warm and dry. No rash noted. No erythema.  Psychiatric: He has a normal mood and affect. His behavior is normal.   LABS: Results for orders placed or performed during the hospital encounter of 12/01/16 (from the past 48 hour(s))  CBC     Status: Abnormal   Collection Time: 12/04/16  5:31 AM  Result Value Ref Range   WBC 9.2 3.8 - 10.6 K/uL   RBC 3.87 (L) 4.40 - 5.90 MIL/uL   Hemoglobin 11.9 (L) 13.0 - 18.0 g/dL   HCT 34.7 (L) 40.0 - 52.0 %   MCV 89.6 80.0 - 100.0 fL   MCH 30.8 26.0 - 34.0 pg   MCHC 34.3 32.0 - 36.0 g/dL   RDW 13.9 11.5 - 14.5 %   Platelets 191 150 - 440 K/uL    Comment: COUNT MAY BE INACCURATE DUE TO FIBRIN CLUMPS.  CULTURE, BLOOD (ROUTINE X 2) w Reflex to ID Panel     Status: None (Preliminary result)   Collection Time: 12/04/16  8:59 AM  Result Value Ref Range   Specimen Description BLOOD RIGHT ASSIST CONTROL    Special Requests      BOTTLES DRAWN AEROBIC AND ANAEROBIC  AEROBIC [place], ANAEROBIC 6CC   Culture NO GROWTH < 24 HOURS    Report Status PENDING   CULTURE, BLOOD (ROUTINE X 2) w Reflex to ID Panel     Status: None (Preliminary result)   Collection Time: 12/04/16  9:11 AM  Result Value Ref Range   Specimen Description BLOOD RIGHT HAND    Special Requests      BOTTLES DRAWN AEROBIC AND ANAEROBIC  AEROBIC 8CC, ANAEROBIC 6CC   Culture NO GROWTH < 24 HOURS    Report Status PENDING   Creatinine, serum     Status: None   Collection Time: 12/05/16   6:26 AM  Result Value Ref Range   Creatinine, Ser 0.76 0.61 - 1.24 mg/dL   GFR calc non Af Amer >60 >60 mL/min  GFR calc Af Amer >60 >60 mL/min    Comment: (NOTE) The eGFR has been calculated using the CKD EPI equation. This calculation has not been validated in all clinical situations. eGFR's persistently <60 mL/min signify possible Chronic Kidney Disease.    No components found for: ESR, C REACTIVE PROTEIN MICRO: Recent Results (from the past 720 hour(s))  Microscopic Examination     Status: None   Collection Time: 11/11/16  2:12 PM  Result Value Ref Range Status   WBC, UA 0-5 0 - 5 /hpf Final   RBC, UA 0-2 0 - 2 /hpf Final   Epithelial Cells (non renal) None seen 0 - 10 /hpf Final   Bacteria, UA None seen None seen/Few Final  Culture, blood (routine x 2)     Status: None (Preliminary result)   Collection Time: 12/01/16  9:51 AM  Result Value Ref Range Status   Specimen Description BLOOD LEFT AC  Final   Special Requests   Final    BOTTLES DRAWN AEROBIC AND ANAEROBIC AER 12ML ANA 10ML   Culture NO GROWTH 4 DAYS  Final   Report Status PENDING  Incomplete  Culture, blood (routine x 2)     Status: Abnormal   Collection Time: 12/01/16  1:57 PM  Result Value Ref Range Status   Specimen Description BLOOD LEFT HAND  Final   Special Requests   Final    BOTTLES DRAWN AEROBIC AND ANAEROBIC AER 7ML ANA 6ML   Culture  Setup Time   Final    GRAM NEGATIVE RODS AEROBIC BOTTLE ONLY CRITICAL RESULT CALLED TO, READ BACK BY AND VERIFIED WITH: NATE COOKSON AT [place] ON 12/02/16 [place]. GRAM POSITIVE COCCI ANAEROBIC BOTTLE ONLY CRITICAL RESULT CALLED TO, READ BACK BY AND VERIFIED WITH: JASON ROBBINS AT 2027 12/02/16.[facility]    Culture (A)  Final    ESCHERICHIA COLI Confirmed Extended Spectrum Beta-Lactamase Producer (ESBL) STAPHYLOCOCCUS SPECIES (COAGULASE NEGATIVE) THE SIGNIFICANCE OF ISOLATING THIS ORGANISM FROM A SINGLE SET OF BLOOD CULTURES WHEN MULTIPLE SETS ARE DRAWN IS UNCERTAIN. PLEASE  NOTIFY THE MICROBIOLOGY DEPARTMENT WITHIN ONE WEEK IF SPECIATION AND SENSITIVITIES ARE REQUIRED. Performed at [facility]    Report Status 12/04/2016 FINAL  Final   Organism ID, Bacteria ESCHERICHIA COLI  Final      Susceptibility   Escherichia coli - MIC*    AMPICILLIN >=32 RESISTANT Resistant     CEFAZOLIN >=64 RESISTANT Resistant     CEFEPIME 8 SENSITIVE Sensitive     CEFTAZIDIME 16 INTERMEDIATE Intermediate     CEFTRIAXONE >=64 RESISTANT Resistant     CIPROFLOXACIN >=4 RESISTANT Resistant     GENTAMICIN <=1 SENSITIVE Sensitive     IMIPENEM <=0.25 SENSITIVE Sensitive     TRIMETH/SULFA >=320 RESISTANT Resistant     AMPICILLIN/SULBACTAM >=32 RESISTANT Resistant     PIP/TAZO 8 SENSITIVE Sensitive     Extended ESBL POSITIVE Resistant     * ESCHERICHIA COLI  Blood Culture ID Panel (Reflexed)     Status: Abnormal   Collection Time: 12/01/16  1:57 PM  Result Value Ref Range Status   Enterococcus species NOT DETECTED NOT DETECTED Final   Listeria monocytogenes NOT DETECTED NOT DETECTED Final   Staphylococcus species NOT DETECTED NOT DETECTED Final   Staphylococcus aureus NOT DETECTED NOT DETECTED Final   Streptococcus species NOT DETECTED NOT DETECTED Final   Streptococcus agalactiae NOT DETECTED NOT DETECTED Final   Streptococcus pneumoniae NOT DETECTED NOT DETECTED Final   Streptococcus pyogenes NOT DETECTED NOT DETECTED Final  Acinetobacter baumannii NOT DETECTED NOT DETECTED Final   Enterobacteriaceae species DETECTED (A) NOT DETECTED Final    Comment: CRITICAL RESULT CALLED TO, READ BACK BY AND VERIFIED WITH: NATE COOKSON AT 0542 ON 12/02/16 [place].    Enterobacter cloacae complex NOT DETECTED NOT DETECTED Final   Escherichia coli DETECTED (A) NOT DETECTED Final    Comment: CRITICAL RESULT CALLED TO, READ BACK BY AND VERIFIED WITH: NATE COOKSON AT 0542 ON 12/02/16 [place].    Klebsiella oxytoca NOT DETECTED NOT DETECTED Final   Klebsiella pneumoniae NOT DETECTED NOT DETECTED  Final   Proteus species NOT DETECTED NOT DETECTED Final   Serratia marcescens NOT DETECTED NOT DETECTED Final   Carbapenem resistance NOT DETECTED NOT DETECTED Final   Haemophilus influenzae NOT DETECTED NOT DETECTED Final   Neisseria meningitidis NOT DETECTED NOT DETECTED Final   Pseudomonas aeruginosa NOT DETECTED NOT DETECTED Final   Candida albicans NOT DETECTED NOT DETECTED Final   Candida glabrata NOT DETECTED NOT DETECTED Final   Candida krusei NOT DETECTED NOT DETECTED Final   Candida parapsilosis NOT DETECTED NOT DETECTED Final   Candida tropicalis NOT DETECTED NOT DETECTED Final  Blood Culture ID Panel (Reflexed)     Status: Abnormal   Collection Time: 12/01/16  1:57 PM  Result Value Ref Range Status   Enterococcus species NOT DETECTED NOT DETECTED Final   Listeria monocytogenes NOT DETECTED NOT DETECTED Final   Staphylococcus species DETECTED (A) NOT DETECTED Final    Comment: CRITICAL RESULT CALLED TO, READ BACK BY AND VERIFIED WITH: JASON ROBBINS AT 2027 12/02/16.[facility]    Staphylococcus aureus NOT DETECTED NOT DETECTED Final   Methicillin resistance NOT DETECTED NOT DETECTED Final   Streptococcus species NOT DETECTED NOT DETECTED Final   Streptococcus agalactiae NOT DETECTED NOT DETECTED Final   Streptococcus pneumoniae NOT DETECTED NOT DETECTED Final   Streptococcus pyogenes NOT DETECTED NOT DETECTED Final   Acinetobacter baumannii NOT DETECTED NOT DETECTED Final   Enterobacteriaceae species NOT DETECTED NOT DETECTED Final   Enterobacter cloacae complex NOT DETECTED NOT DETECTED Final   Escherichia coli NOT DETECTED NOT DETECTED Final   Klebsiella oxytoca NOT DETECTED NOT DETECTED Final   Klebsiella pneumoniae NOT DETECTED NOT DETECTED Final   Proteus species NOT DETECTED NOT DETECTED Final   Serratia marcescens NOT DETECTED NOT DETECTED Final   Haemophilus influenzae NOT DETECTED NOT DETECTED Final   Neisseria meningitidis NOT DETECTED NOT DETECTED Final   Pseudomonas  aeruginosa NOT DETECTED NOT DETECTED Final   Candida albicans NOT DETECTED NOT DETECTED Final   Candida glabrata NOT DETECTED NOT DETECTED Final   Candida krusei NOT DETECTED NOT DETECTED Final   Candida parapsilosis NOT DETECTED NOT DETECTED Final   Candida tropicalis NOT DETECTED NOT DETECTED Final  CULTURE, BLOOD (ROUTINE X 2) w Reflex to ID Panel     Status: None (Preliminary result)   Collection Time: 12/04/16  8:59 AM  Result Value Ref Range Status   Specimen Description BLOOD RIGHT ASSIST CONTROL  Final   Special Requests   Final    BOTTLES DRAWN AEROBIC AND ANAEROBIC  AEROBIC [place], ANAEROBIC 6CC   Culture NO GROWTH < 24 HOURS  Final   Report Status PENDING  Incomplete  CULTURE, BLOOD (ROUTINE X 2) w Reflex to ID Panel     Status: None (Preliminary result)   Collection Time: 12/04/16  9:11 AM  Result Value Ref Range Status   Specimen Description BLOOD RIGHT HAND  Final   Special Requests   Final  BOTTLES DRAWN AEROBIC AND ANAEROBIC  AEROBIC 8CC, ANAEROBIC 6CC   Culture NO GROWTH < 24 HOURS  Final   Report Status PENDING  Incomplete    IMAGING: Dg Chest Port 1 View  Result Date: 12/04/2016 CLINICAL DATA:  PICC line placed since, sepsis secondary UTI, history hypertension, coronary artery disease post MI EXAM: PORTABLE CHEST 1 VIEW COMPARISON:  Portable exam 1736 hours compared to 06/17/2016 FINDINGS: RIGHT PICC line with tip projecting over SVC. Upper normal heart size. Mediastinal contours and pulmonary vascularity normal. Lungs clear. No pleural effusion or pneumothorax. Bones unremarkable. IMPRESSION: No acute abnormalities. Electronically Signed   By: Lavonia Dana M.D.   On: 12/04/2016 17:58    Assessment:   Albert Wade is a 70 y.o. male with ESBL E coli sepsis following recent prostate biopsy.  Clinically improving   Recommendations Picc placed.  Would rec 10 total days of carbapenem treatment. Should follow with urology - no need to see me unless infection recurs  or other concerns.   Thank you very much for allowing me to participate in the care of this patient. Please call with questions.   Cheral Marker. Ola Spurr, MD

## 2016-12-06 LAB — CULTURE, BLOOD (ROUTINE X 2): Culture: NO GROWTH

## 2016-12-07 NOTE — Discharge Summary (Signed)
[facility] NAME: Albert Wade    MR#:  [id number]  DATE OF BIRTH:  1946/01/28  DATE OF ADMISSION:  12/01/2016   ADMITTING PHYSICIAN: Henreitta Leber, MD  DATE OF DISCHARGE: 12/05/2016  6:03 PM  PRIMARY CARE PHYSICIAN: Cathlean Cower, MD   ADMISSION DIAGNOSIS:  Urinary tract infection without hematuria, site unspecified [N39.0] DISCHARGE DIAGNOSIS:  Active Problems:   UTI (urinary tract infection)  SECONDARY DIAGNOSIS:   Past Medical History:  Diagnosis Date  . Anxiety   . Arthritis    back,wrists,hx. previous fractures  . BPH (benign prostatic hypertrophy)   . CAD (coronary artery disease)    angioplasty   . Chronic back pain   . Chronic bronchitis   . Depression   . GERD (gastroesophageal reflux disease)   . H/O hiatal hernia    pts wife states pt does not have   . Heart murmur   . Hypercholesteremia   . Hypertension   . MI (myocardial infarction)    mid-'90's  . Peripheral neuropathy (HCC)    peripheral neuropathy  . Pneumonia   . Sleep apnea    mild-no cpap, unable to tolerate  . Umbilical hernia   . Varicose veins    HOSPITAL COURSE:  70 year old male with past medical history of coronary artery disease status post MI, BPH, chronic back pain, neuropathy, hypertension, hyperlipidemia, history of umbilical hernia admitted to the hospital due to fever or chills headache and noted to have a urinary tract infection.  1. Sepsis- present on admission Due to ESBL E coli bacteremia - remains afebrile and hemodynamically stable.  - ID recommended 10 days IV Ertapenem through PICC line  2. Urinary tract infection-source of patient's sepsis. Due to ESBL  3. Chronic back pain-continue methadone, Percocet as needed.  4. Anxiety/depression-continue Xanax, Cymbalta.  5. BPH-continue Flomax.  6. Hyperlipidemia-continue simvastatin.  7. History of coronary artery disease-no acute chest pain or acute  symptoms. -Continue aspirin, statin.  DISCHARGE CONDITIONS:  stable CONSULTS OBTAINED:  Treatment Team:  Leonel Ramsay, MD DRUG ALLERGIES:   Allergies  Allergen Reactions  . Seroquel [Quetiapine] Hives   DISCHARGE MEDICATIONS:     Medication List    TAKE these medications   ALPRAZolam 1 MG tablet Commonly known as:  XANAX Take 1 tablet (1 mg total) by mouth 2 (two) times daily as needed for anxiety.   aspirin 81 MG tablet Take 81 mg by mouth daily.   budesonide-formoterol 160-4.5 MCG/ACT inhaler Commonly known as:  SYMBICORT Inhale 2 puffs into the lungs 2 (two) times daily.   DULoxetine 30 MG capsule Commonly known as:  CYMBALTA Take 30 mg by mouth daily.   ertapenem 1 g in sodium chloride 0.9 % 50 mL Inject 1 g into the vein daily.   furosemide 40 MG tablet Commonly known as:  LASIX Take1 tablet on day one(40 mg)Then take1 and1/2 tab on day two(47m)and then take two tablets on daythree(873m.Then go back to day one What changed:  how much to take  how to take this  when to take this  reasons to take this  additional instructions   methadone 10 MG tablet Commonly known as:  DOLOPHINE Take 10 mg by mouth 4 (four) times daily.   oxyCODONE-acetaminophen 10-325 MG tablet Commonly known as:  PERCOCET Take 1 tablet by mouth every 4 (four) hours as needed for pain.   potassium chloride 10 MEQ tablet Commonly known as:  KLOR-CON 10  2 tabs by mouth per day on days taking furosemide What changed:  how much to take  how to take this  when to take this  reasons to take this  additional instructions   simvastatin 80 MG tablet Commonly known as:  ZOCOR TAKE ONE TABLET BY MOUTH EVERY EVENING.   tamsulosin 0.4 MG Caps capsule Commonly known as:  FLOMAX Take 1 capsule (0.4 mg total) by mouth daily.   zolpidem 5 MG tablet Commonly known as:  AMBIEN Take 1 tablet (5 mg total) by mouth at bedtime as needed. for sleep        DISCHARGE  INSTRUCTIONS:  Discharge antibiotics Ertapenem       1         grmas every    24        hours  PICC Care per protocol Labs weekly while on IV antibiotics      CBC w diff         Comprehensive met panel  Planned duration of antibiotics 10 days from admission  Stop date        12/14/16          FAX weekly labs to [phone] DIET:  Regular diet DISCHARGE CONDITION:  Stable ACTIVITY:  Activity as tolerated OXYGEN:  Home Oxygen: No.  Oxygen Delivery: room air DISCHARGE LOCATION:  Home with Home Health   If you experience worsening of your admission symptoms, develop shortness of breath, life threatening emergency, suicidal or homicidal thoughts you must seek medical attention immediately by calling 911 or calling your MD immediately  if symptoms less severe.  You Must read complete instructions/literature along with all the possible adverse reactions/side effects for all the Medicines you take and that have been prescribed to you. Take any new Medicines after you have completely understood and accpet all the possible adverse reactions/side effects.   Please note  You were cared for by a hospitalist during your hospital stay. If you have any questions about your discharge medications or the care you received while you were in the hospital after you are discharged, you can call the unit and asked to speak with the hospitalist on call if the hospitalist that took care of you is not available. Once you are discharged, your primary care physician will handle any further medical issues. Please note that NO REFILLS for any discharge medications will be authorized once you are discharged, as it is imperative that you return to your primary care physician (or establish a relationship with a primary care physician if you do not have one) for your aftercare needs so that they can reassess your need for medications and monitor your lab values.    On the day of Discharge:  VITAL SIGNS:  Blood  pressure (!) 141/61, pulse 63, temperature 97.4 F (36.3 C), temperature source Oral, resp. rate 18, height '5\' 10"'  (1.778 m), weight 97.5 kg (215 lb), SpO2 98 %. PHYSICAL EXAMINATION:  GENERAL:  70 y.o.-year-old patient lying in the bed with no acute distress.  EYES: Pupils equal, round, reactive to light and accommodation. No scleral icterus. Extraocular muscles intact.  HEENT: Head atraumatic, normocephalic. Oropharynx and nasopharynx clear.  NECK:  Supple, no jugular venous distention. No thyroid enlargement, no tenderness.  LUNGS: Normal breath sounds bilaterally, no wheezing, rales,rhonchi or crepitation. No use of accessory muscles of respiration.  CARDIOVASCULAR: S1, S2 normal. No murmurs, rubs, or gallops.  ABDOMEN: Soft, non-tender, non-distended. Bowel sounds present. No organomegaly or mass.  EXTREMITIES: No  pedal edema, cyanosis, or clubbing.  NEUROLOGIC: Cranial nerves II through XII are intact. Muscle strength 5/5 in all extremities. Sensation intact. Gait not checked.  PSYCHIATRIC: The patient is alert and oriented x 3.  SKIN: No obvious rash, lesion, or ulcer.  DATA REVIEW:   CBC  Recent Labs Lab 12/04/16 0531  WBC 9.2  HGB 11.9*  HCT 34.7*  PLT 191    Chemistries   Recent Labs Lab 12/01/16 0951 12/02/16 0453 12/05/16 0626  NA 136 136  --   K 4.2 3.6  --   CL 99* 103  --   CO2 28 28  --   GLUCOSE 112* 115*  --   BUN 9 9  --   CREATININE 1.06 0.95 0.76  CALCIUM 8.7* 7.9*  --   AST 31  --   --   ALT 18  --   --   ALKPHOS 67  --   --   BILITOT 1.3*  --   --      Microbiology Results  Results for orders placed or performed during the hospital encounter of 12/01/16  Culture, blood (routine x 2)     Status: None   Collection Time: 12/01/16  9:51 AM  Result Value Ref Range Status   Specimen Description BLOOD LEFT AC  Final   Special Requests   Final    BOTTLES DRAWN AEROBIC AND ANAEROBIC AER 12ML ANA 10ML   Culture NO GROWTH 5 DAYS  Final   Report  Status 12/06/2016 FINAL  Final  Culture, blood (routine x 2)     Status: Abnormal   Collection Time: 12/01/16  1:57 PM  Result Value Ref Range Status   Specimen Description BLOOD LEFT HAND  Final   Special Requests   Final    BOTTLES DRAWN AEROBIC AND ANAEROBIC AER 7ML ANA 6ML   Culture  Setup Time   Final    GRAM NEGATIVE RODS AEROBIC BOTTLE ONLY CRITICAL RESULT CALLED TO, READ BACK BY AND VERIFIED WITH: NATE COOKSON AT 0542 ON 12/02/16 [place]. GRAM POSITIVE COCCI ANAEROBIC BOTTLE ONLY CRITICAL RESULT CALLED TO, READ BACK BY AND VERIFIED WITH: JASON ROBBINS AT 2027 12/02/16.[facility]    Culture (A)  Final    ESCHERICHIA COLI Confirmed Extended Spectrum Beta-Lactamase Producer (ESBL) STAPHYLOCOCCUS SPECIES (COAGULASE NEGATIVE) THE SIGNIFICANCE OF ISOLATING THIS ORGANISM FROM A SINGLE SET OF BLOOD CULTURES WHEN MULTIPLE SETS ARE DRAWN IS UNCERTAIN. PLEASE NOTIFY THE MICROBIOLOGY DEPARTMENT WITHIN ONE WEEK IF SPECIATION AND SENSITIVITIES ARE REQUIRED. Performed at [facility]    Report Status 12/04/2016 FINAL  Final   Organism ID, Bacteria ESCHERICHIA COLI  Final      Susceptibility   Escherichia coli - MIC*    AMPICILLIN >=32 RESISTANT Resistant     CEFAZOLIN >=64 RESISTANT Resistant     CEFEPIME 8 SENSITIVE Sensitive     CEFTAZIDIME 16 INTERMEDIATE Intermediate     CEFTRIAXONE >=64 RESISTANT Resistant     CIPROFLOXACIN >=4 RESISTANT Resistant     GENTAMICIN <=1 SENSITIVE Sensitive     IMIPENEM <=0.25 SENSITIVE Sensitive     TRIMETH/SULFA >=320 RESISTANT Resistant     AMPICILLIN/SULBACTAM >=32 RESISTANT Resistant     PIP/TAZO 8 SENSITIVE Sensitive     Extended ESBL POSITIVE Resistant     * ESCHERICHIA COLI  Blood Culture ID Panel (Reflexed)     Status: Abnormal   Collection Time: 12/01/16  1:57 PM  Result Value Ref Range Status   Enterococcus species NOT DETECTED NOT DETECTED  Final   Listeria monocytogenes NOT DETECTED NOT DETECTED Final   Staphylococcus species NOT DETECTED  NOT DETECTED Final   Staphylococcus aureus NOT DETECTED NOT DETECTED Final   Streptococcus species NOT DETECTED NOT DETECTED Final   Streptococcus agalactiae NOT DETECTED NOT DETECTED Final   Streptococcus pneumoniae NOT DETECTED NOT DETECTED Final   Streptococcus pyogenes NOT DETECTED NOT DETECTED Final   Acinetobacter baumannii NOT DETECTED NOT DETECTED Final   Enterobacteriaceae species DETECTED (A) NOT DETECTED Final    Comment: CRITICAL RESULT CALLED TO, READ BACK BY AND VERIFIED WITH: NATE COOKSON AT 0542 ON 12/02/16 [place].    Enterobacter cloacae complex NOT DETECTED NOT DETECTED Final   Escherichia coli DETECTED (A) NOT DETECTED Final    Comment: CRITICAL RESULT CALLED TO, READ BACK BY AND VERIFIED WITH: NATE COOKSON AT 0542 ON 12/02/16 [place].    Klebsiella oxytoca NOT DETECTED NOT DETECTED Final   Klebsiella pneumoniae NOT DETECTED NOT DETECTED Final   Proteus species NOT DETECTED NOT DETECTED Final   Serratia marcescens NOT DETECTED NOT DETECTED Final   Carbapenem resistance NOT DETECTED NOT DETECTED Final   Haemophilus influenzae NOT DETECTED NOT DETECTED Final   Neisseria meningitidis NOT DETECTED NOT DETECTED Final   Pseudomonas aeruginosa NOT DETECTED NOT DETECTED Final   Candida albicans NOT DETECTED NOT DETECTED Final   Candida glabrata NOT DETECTED NOT DETECTED Final   Candida krusei NOT DETECTED NOT DETECTED Final   Candida parapsilosis NOT DETECTED NOT DETECTED Final   Candida tropicalis NOT DETECTED NOT DETECTED Final  Blood Culture ID Panel (Reflexed)     Status: Abnormal   Collection Time: 12/01/16  1:57 PM  Result Value Ref Range Status   Enterococcus species NOT DETECTED NOT DETECTED Final   Listeria monocytogenes NOT DETECTED NOT DETECTED Final   Staphylococcus species DETECTED (A) NOT DETECTED Final    Comment: CRITICAL RESULT CALLED TO, READ BACK BY AND VERIFIED WITH: JASON ROBBINS AT 2027 12/02/16.[facility]    Staphylococcus aureus NOT DETECTED NOT DETECTED Final     Methicillin resistance NOT DETECTED NOT DETECTED Final   Streptococcus species NOT DETECTED NOT DETECTED Final   Streptococcus agalactiae NOT DETECTED NOT DETECTED Final   Streptococcus pneumoniae NOT DETECTED NOT DETECTED Final   Streptococcus pyogenes NOT DETECTED NOT DETECTED Final   Acinetobacter baumannii NOT DETECTED NOT DETECTED Final   Enterobacteriaceae species NOT DETECTED NOT DETECTED Final   Enterobacter cloacae complex NOT DETECTED NOT DETECTED Final   Escherichia coli NOT DETECTED NOT DETECTED Final   Klebsiella oxytoca NOT DETECTED NOT DETECTED Final   Klebsiella pneumoniae NOT DETECTED NOT DETECTED Final   Proteus species NOT DETECTED NOT DETECTED Final   Serratia marcescens NOT DETECTED NOT DETECTED Final   Haemophilus influenzae NOT DETECTED NOT DETECTED Final   Neisseria meningitidis NOT DETECTED NOT DETECTED Final   Pseudomonas aeruginosa NOT DETECTED NOT DETECTED Final   Candida albicans NOT DETECTED NOT DETECTED Final   Candida glabrata NOT DETECTED NOT DETECTED Final   Candida krusei NOT DETECTED NOT DETECTED Final   Candida parapsilosis NOT DETECTED NOT DETECTED Final   Candida tropicalis NOT DETECTED NOT DETECTED Final  CULTURE, BLOOD (ROUTINE X 2) w Reflex to ID Panel     Status: None (Preliminary result)   Collection Time: 12/04/16  8:59 AM  Result Value Ref Range Status   Specimen Description BLOOD RIGHT ASSIST CONTROL  Final   Special Requests   Final    BOTTLES DRAWN AEROBIC AND ANAEROBIC  AEROBIC [place], ANAEROBIC [place]  Culture NO GROWTH 3 DAYS  Final   Report Status PENDING  Incomplete  CULTURE, BLOOD (ROUTINE X 2) w Reflex to ID Panel     Status: None (Preliminary result)   Collection Time: 12/04/16  9:11 AM  Result Value Ref Range Status   Specimen Description BLOOD RIGHT HAND  Final   Special Requests   Final    BOTTLES DRAWN AEROBIC AND ANAEROBIC  AEROBIC 8CC, ANAEROBIC 6CC   Culture NO GROWTH 3 DAYS  Final   Report Status PENDING  Incomplete     RADIOLOGY:  No results found.   Management plans discussed with the patient, family and they are in agreement.  CODE STATUS:  Code Status History    Date Active Date Inactive Code Status Order ID Comments User Context   12/01/2016  4:56 PM 12/05/2016  9:03 PM Full Code [id number]  Henreitta Leber, MD Inpatient      TOTAL TIME TAKING CARE OF THIS PATIENT: 45 minutes.    Max Sane M.D on 12/07/2016 at 11:19 PM  Between 7am to 6pm - Pager - [phone]  After 6pm go to [email or web address] - Proofreader  [facility]  Office  [phone]  CC: Primary care physician; Cathlean Cower, MD   Note: This dictation was prepared with Dragon dictation along with smaller phrase technology. Any transcriptional errors that result from this process are unintentional.

## 2016-12-08 ENCOUNTER — Inpatient Hospital Stay: Payer: Medicare HMO | Admitting: Internal Medicine

## 2016-12-09 ENCOUNTER — Ambulatory Visit (INDEPENDENT_AMBULATORY_CARE_PROVIDER_SITE_OTHER): Payer: Medicare HMO | Admitting: Urology

## 2016-12-09 ENCOUNTER — Encounter: Payer: Self-pay | Admitting: Urology

## 2016-12-09 VITALS — BP 154/68 | HR 78 | Ht 70.5 in | Wt 213.8 lb

## 2016-12-09 DIAGNOSIS — N400 Enlarged prostate without lower urinary tract symptoms: Secondary | ICD-10-CM

## 2016-12-09 DIAGNOSIS — C61 Malignant neoplasm of prostate: Secondary | ICD-10-CM

## 2016-12-09 LAB — CULTURE, BLOOD (ROUTINE X 2)
Culture: NO GROWTH
Culture: NO GROWTH

## 2016-12-09 NOTE — Progress Notes (Signed)
12/09/2016 3:10 PM   [name] [birth year] [id number]  Referring provider: Biagio Borg, MD [address] [place], [place] [address]  Chief Complaint  Patient presents with  . Follow-up    Prostate cancer     HPI: The patient is a 70 year old gentleman who presents today to discuss his prostate biopsy results.  1. Prostate cancer The patient has a new diagnosis after undergoing prostate cancer of Gleason 4+4 = 8 prostate cancer. PSA at the time of biopsy was 17.6. He has 6 of 12 cores positive. 2 of the cores of Gleason 4+4 = 8 disease. 3 of the cores have Gleason 4+3 = 7 disease. One core was Gleason 3+3 = 6 disease. All cores that were positive ranged from 50 to 100% volume disease.  His father had metastatic prostate cancer but did not die from it.  2. Post prostate biopsy sepsis The patient was admitted to the hospital for postbiopsy sepsis. He has recovered from this. He grew ESBL Escherichia coli. Interestingly, the bacteria was sensitive to gentamicin which was given at the time of prostate biopsy. He has since recovered.    3. BPH Currently on flomax   PMH: Past Medical History:  Diagnosis Date  . Anxiety   . Arthritis    back,wrists,hx. previous fractures  . BPH (benign prostatic hypertrophy)   . CAD (coronary artery disease)    angioplasty   . Chronic back pain   . Chronic bronchitis   . Depression   . GERD (gastroesophageal reflux disease)   . H/O hiatal hernia    pts wife states pt does not have   . Heart murmur   . Hypercholesteremia   . Hypertension   . MI (myocardial infarction)    mid-'90's  . Peripheral neuropathy (HCC)    peripheral neuropathy  . Pneumonia   . Sleep apnea    mild-no cpap, unable to tolerate  . Umbilical hernia   . Varicose veins     Surgical History: Past Surgical History:  Procedure Laterality Date  . ANGIOPLASTY    . APPENDECTOMY    . BACK SURGERY     x3  . CARDIAC CATHETERIZATION     11'11  .  CIRCUMCISION N/A 10/21/2013   Procedure: CIRCUMCISION ADULT;  Surgeon: Bernestine Amass, MD;  Location: [facility];  Service: Urology;  Laterality: N/A;  . CYSTOSCOPY N/A 10/21/2013   Procedure: CYSTOSCOPY FLEXIBLE;  Surgeon: Bernestine Amass, MD;  Location: [facility];  Service: Urology;  Laterality: N/A;  . INSERTION OF MESH N/A 06/17/2016   Procedure: INSERTION OF MESH;  Surgeon: Excell Seltzer, MD;  Location: [facility];  Service: General;  Laterality: N/A;  . UMBILICAL HERNIA REPAIR N/A 06/17/2016   Procedure: REPAIR UMBILICAL HERNIA WITH MESH;  Surgeon: Excell Seltzer, MD;  Location: [facility];  Service: General;  Laterality: N/A;  . WRIST SURGERY Right     Home Medications:  Allergies as of 12/09/2016      Reactions   Seroquel [quetiapine] Hives      Medication List       Accurate as of 12/09/16  3:10 PM. Always use your most recent med list.          ALPRAZolam 1 MG tablet Commonly known as:  XANAX Take 1 tablet (1 mg total) by mouth 2 (two) times daily as needed for anxiety.   aspirin 81 MG tablet Take 81 mg by mouth daily.   budesonide-formoterol 160-4.5 MCG/ACT inhaler Commonly known  as:  SYMBICORT Inhale 2 puffs into the lungs 2 (two) times daily.   DULoxetine 30 MG capsule Commonly known as:  CYMBALTA Take 30 mg by mouth daily.   ertapenem 1 g in sodium chloride 0.9 % 50 mL Inject 1 g into the vein daily.   furosemide 40 MG tablet Commonly known as:  LASIX Take1 tablet on day one(40 mg)Then take1 and1/2 tab on day two(60mg )and then take two tablets on daythree(80mg ).Then go back to day one   methadone 10 MG tablet Commonly known as:  DOLOPHINE Take 10 mg by mouth 4 (four) times daily.   oxyCODONE-acetaminophen 10-325 MG tablet Commonly known as:  PERCOCET Take 1 tablet by mouth every 4 (four) hours as needed for pain.   potassium chloride 10 MEQ tablet Commonly known as:  KLOR-CON 10 2 tabs by mouth per day on days taking furosemide   simvastatin 80 MG  tablet Commonly known as:  ZOCOR TAKE ONE TABLET BY MOUTH EVERY EVENING.   tamsulosin 0.4 MG Caps capsule Commonly known as:  FLOMAX Take 1 capsule (0.4 mg total) by mouth daily.   zolpidem 5 MG tablet Commonly known as:  AMBIEN Take 1 tablet (5 mg total) by mouth at bedtime as needed. for sleep       Allergies:  Allergies  Allergen Reactions  . Seroquel [Quetiapine] Hives    Family History: Family History  Problem Relation Age of Onset  . Prostate cancer Father   . Emphysema Father   . Hyperlipidemia Father   . Hyperlipidemia Mother     Social History:  reports that he quit smoking about 20 years ago. His smoking use included Cigarettes. He has a 20.00 pack-year smoking history. He has never used smokeless tobacco. He reports that he does not drink alcohol or use drugs.  ROS:                                        Physical Exam: BP (!) 154/68   Pulse 78   Ht 5' 10.5" (1.791 m)   Wt 213 lb 12.8 oz (97 kg)   BMI 30.24 kg/m   Constitutional:  Alert and oriented, No acute distress. HEENT: [place] AT, moist mucus membranes.  Trachea midline, no masses. Cardiovascular: No clubbing, cyanosis, or edema. Respiratory: Normal respiratory effort, no increased work of breathing. GI: Abdomen is soft, nontender, nondistended, no abdominal masses GU: No CVA tenderness.  Skin: No rashes, bruises or suspicious lesions. Lymph: No cervical or inguinal adenopathy. Neurologic: Grossly intact, no focal deficits, moving all 4 extremities. Psychiatric: Normal mood and affect.  Laboratory Data: Lab Results  Component Value Date   WBC 9.2 12/04/2016   HGB 11.9 (L) 12/04/2016   HCT 34.7 (L) 12/04/2016   MCV 89.6 12/04/2016   PLT 191 12/04/2016    Lab Results  Component Value Date   CREATININE 0.76 12/05/2016    Lab Results  Component Value Date   PSA 10.73 (H) 05/19/2016    No results found for: TESTOSTERONE  Lab Results  Component Value Date    HGBA1C 6.0 05/19/2016    Urinalysis    Component Value Date/Time   COLORURINE YELLOW (A) 12/01/2016 1121   APPEARANCEUR CLEAR (A) 12/01/2016 1121   APPEARANCEUR Hazy (A) 11/11/2016 1412   LABSPEC 1.011 12/01/2016 1121   PHURINE 7.0 12/01/2016 1121   GLUCOSEU NEGATIVE 12/01/2016 [place] 05/19/2016 1610  HGBUR NEGATIVE 12/01/2016 1121   BILIRUBINUR NEGATIVE 12/01/2016 1121   BILIRUBINUR Negative 11/11/2016 1412   KETONESUR NEGATIVE 12/01/2016 1121   PROTEINUR NEGATIVE 12/01/2016 1121   UROBILINOGEN 0.2 05/19/2016 1610   NITRITE POSITIVE (A) 12/01/2016 1121   LEUKOCYTESUR SMALL (A) 12/01/2016 1121   LEUKOCYTESUR Negative 11/11/2016 1412    Assessment & Plan:    1. 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 We discussed the natural etiology of prostate cancer as well as implications of this diagnosis. He understands that due to his high-risk disease that he meets criteria for imaging studies to rule out metastatic spread. We will arrange for him to undergo a CT pelvis as well as a bone scan. We did briefly discuss basic treatment options for prostate cancer if his metastatic workup was negative. These include a robotic prostatectomy as well as radiation therapy with likely adjuvant androgen deprivation therapy. We also discussed that if he does have metastatic disease, that there are many treatment options for this as well. All questions were answered. He was given the 100 questions about prostate cancer book. He will follow-up to discuss the next step after undergoing metastatic workup.  2. BPH Continue Flomax  Return for after CT scan and bone scan.  Nickie Retort, MD  [facility] [address], [place], [place] [address] [phone]

## 2016-12-13 ENCOUNTER — Inpatient Hospital Stay: Payer: Medicare HMO | Admitting: Internal Medicine

## 2016-12-16 DIAGNOSIS — Z0289 Encounter for other administrative examinations: Secondary | ICD-10-CM | POA: Diagnosis not present

## 2016-12-21 ENCOUNTER — Encounter
Admission: RE | Admit: 2016-12-21 | Discharge: 2016-12-21 | Disposition: A | Discharge status: Home / Self Care | Payer: Medicare HMO | Source: Ambulatory Visit | Attending: Urology | Admitting: Urology

## 2016-12-21 ENCOUNTER — Ambulatory Visit
Admission: RE | Admit: 2016-12-21 | Discharge: 2016-12-21 | Disposition: A | Discharge status: Home / Self Care | Payer: Medicare HMO | Source: Ambulatory Visit | Attending: Urology | Admitting: Urology

## 2016-12-21 DIAGNOSIS — C61 Malignant neoplasm of prostate: Secondary | ICD-10-CM | POA: Insufficient documentation

## 2016-12-21 DIAGNOSIS — N400 Enlarged prostate without lower urinary tract symptoms: Secondary | ICD-10-CM | POA: Diagnosis present

## 2016-12-21 MED ORDER — IOPAMIDOL (ISOVUE-300) INJECTION 61%
100.0000 mL | Freq: Once | INTRAVENOUS | Status: AC | PRN
  Administered 2016-12-21: 100 mL via INTRAVENOUS

## 2016-12-21 MED ORDER — TECHNETIUM TC 99M MEDRONATE IV KIT
25.0000 | PACK | Freq: Once | INTRAVENOUS | Status: AC | PRN
  Administered 2016-12-21: 21.034 via INTRAVENOUS

## 2016-12-22 ENCOUNTER — Ambulatory Visit (INDEPENDENT_AMBULATORY_CARE_PROVIDER_SITE_OTHER): Payer: Medicare HMO | Admitting: Urology

## 2016-12-22 ENCOUNTER — Encounter: Payer: Self-pay | Admitting: Urology

## 2016-12-22 VITALS — BP 147/66 | HR 76 | Ht 70.5 in | Wt 220.4 lb

## 2016-12-22 DIAGNOSIS — C61 Malignant neoplasm of prostate: Secondary | ICD-10-CM

## 2016-12-22 NOTE — Progress Notes (Signed)
12/22/2016 9:50 AM   Albert Wade 1946-09-12 [id number]  Referring provider: Biagio Borg, MD [address] [place], [place] [address]  Chief Complaint  Patient presents with  . Follow-up    Bone scan results    HPI: The patient is a 70 year old gentleman who presents today to discuss CT and bone scan results.  1. Prostate cancer The patient has a new diagnosis after undergoing prostate cancer of Gleason 4+4 = 8 prostate cancer. PSA at the time of biopsy was 17.6. He has 6 of 12 cores positive. 2 of the cores of Gleason 4+4 = 8 disease. 3 of the cores have Gleason 4+3 = 7 disease. One core was Gleason 3+3 = 6 disease. All cores that were positive ranged from 50 to 100% volume disease.  CT pelvis and bone scan were negative for metastatic disease.  His father had metastatic prostate cancer but did not die from it.  2. BPH Currently on flomax    3. Post biopsy sepsis Patient admitted for ESBL post biopsy sepsis. Doing better now   PMH: Past Medical History:  Diagnosis Date  . Anxiety   . Arthritis    back,wrists,hx. previous fractures  . BPH (benign prostatic hypertrophy)   . CAD (coronary artery disease)    angioplasty   . Chronic back pain   . Chronic bronchitis   . Depression   . GERD (gastroesophageal reflux disease)   . H/O hiatal hernia    pts wife states pt does not have   . Heart murmur   . Hypercholesteremia   . Hypertension   . MI (myocardial infarction)    mid-'90's  . Peripheral neuropathy (HCC)    peripheral neuropathy  . Pneumonia   . Sleep apnea    mild-no cpap, unable to tolerate  . Umbilical hernia   . Varicose veins     Surgical History: Past Surgical History:  Procedure Laterality Date  . ANGIOPLASTY    . APPENDECTOMY    . BACK SURGERY     x3  . CARDIAC CATHETERIZATION     11'11  . CIRCUMCISION N/A 10/21/2013   Procedure: CIRCUMCISION ADULT;  Surgeon: Bernestine Amass, MD;  Location: [facility];  Service: Urology;   Laterality: N/A;  . CYSTOSCOPY N/A 10/21/2013   Procedure: CYSTOSCOPY FLEXIBLE;  Surgeon: Bernestine Amass, MD;  Location: [facility];  Service: Urology;  Laterality: N/A;  . INSERTION OF MESH N/A 06/17/2016   Procedure: INSERTION OF MESH;  Surgeon: Excell Seltzer, MD;  Location: [facility];  Service: General;  Laterality: N/A;  . UMBILICAL HERNIA REPAIR N/A 06/17/2016   Procedure: REPAIR UMBILICAL HERNIA WITH MESH;  Surgeon: Excell Seltzer, MD;  Location: [facility];  Service: General;  Laterality: N/A;  . WRIST SURGERY Right     Home Medications:  Allergies as of 12/22/2016      Reactions   Seroquel [quetiapine] Hives      Medication List       Accurate as of 12/22/16  9:50 AM. Always use your most recent med list.          ALPRAZolam 1 MG tablet Commonly known as:  XANAX Take 1 tablet (1 mg total) by mouth 2 (two) times daily as needed for anxiety.   aspirin 81 MG tablet Take 81 mg by mouth daily.   budesonide-formoterol 160-4.5 MCG/ACT inhaler Commonly known as:  SYMBICORT Inhale 2 puffs into the lungs 2 (two) times daily.   DULoxetine 30 MG capsule  Commonly known as:  CYMBALTA Take 30 mg by mouth daily.   furosemide 40 MG tablet Commonly known as:  LASIX Take1 tablet on day one(40 mg)Then take1 and1/2 tab on day two(60mg )and then take two tablets on daythree(80mg ).Then go back to day one   methadone 10 MG tablet Commonly known as:  DOLOPHINE Take 10 mg by mouth 4 (four) times daily.   oxyCODONE-acetaminophen 10-325 MG tablet Commonly known as:  PERCOCET Take 1 tablet by mouth every 4 (four) hours as needed for pain.   potassium chloride 10 MEQ tablet Commonly known as:  KLOR-CON 10 2 tabs by mouth per day on days taking furosemide   simvastatin 80 MG tablet Commonly known as:  ZOCOR TAKE ONE TABLET BY MOUTH EVERY EVENING.   tamsulosin 0.4 MG Caps capsule Commonly known as:  FLOMAX Take 1 capsule (0.4 mg total) by mouth daily.   zolpidem 5 MG tablet Commonly  known as:  AMBIEN Take 1 tablet (5 mg total) by mouth at bedtime as needed. for sleep       Allergies:  Allergies  Allergen Reactions  . Seroquel [Quetiapine] Hives    Family History: Family History  Problem Relation Age of Onset  . Prostate cancer Father   . Emphysema Father   . Hyperlipidemia Father   . Hyperlipidemia Mother     Social History:  reports that he quit smoking about 21 years ago. His smoking use included Cigarettes. He has a 20.00 pack-year smoking history. He has never used smokeless tobacco. He reports that he does not drink alcohol or use drugs.  ROS: UROLOGY Frequent Urination?: Yes Hard to postpone urination?: Yes Burning/pain with urination?: No Get up at night to urinate?: Yes Leakage of urine?: Yes Urine stream starts and stops?: Yes Trouble starting stream?: No Do you have to strain to urinate?: No Blood in urine?: No Urinary tract infection?: Yes Sexually transmitted disease?: No Injury to kidneys or bladder?: No Painful intercourse?: No Weak stream?: No Erection problems?: No Penile pain?: No  Gastrointestinal Nausea?: No Vomiting?: No Indigestion/heartburn?: No Diarrhea?: No Constipation?: Yes  Constitutional Fever: No Night sweats?: No Weight loss?: No Fatigue?: Yes  Skin Skin rash/lesions?: No Itching?: No  Eyes Blurred vision?: No Double vision?: No  Ears/Nose/Throat Sore throat?: No Sinus problems?: No  Hematologic/Lymphatic Swollen glands?: No Easy bruising?: No  Cardiovascular Leg swelling?: Yes Chest pain?: No  Respiratory Cough?: No Shortness of breath?: No  Endocrine Excessive thirst?: No  Musculoskeletal Back pain?: Yes Joint pain?: Yes  Neurological Headaches?: No Dizziness?: No  Psychologic Depression?: Yes Anxiety?: No  Physical Exam: BP (!) 147/66 (BP Location: Left Arm, Patient Position: Sitting, Cuff Size: Normal)   Pulse 76   Ht 5' 10.5" (1.791 m)   Wt 220 lb 6.4 oz (100 kg)    BMI 31.18 kg/m   Constitutional:  Alert and oriented, No acute distress. HEENT: [place] AT, moist mucus membranes.  Trachea midline, no masses. Cardiovascular: No clubbing, cyanosis, or edema. Respiratory: Normal respiratory effort, no increased work of breathing. GI: Abdomen is soft, nontender, nondistended, no abdominal masses GU: No CVA tenderness.  Skin: No rashes, bruises or suspicious lesions. Lymph: No cervical or inguinal adenopathy. Neurologic: Grossly intact, no focal deficits, moving all 4 extremities. Psychiatric: Normal mood and affect.  Laboratory Data: Lab Results  Component Value Date   WBC 9.2 12/04/2016   HGB 11.9 (L) 12/04/2016   HCT 34.7 (L) 12/04/2016   MCV 89.6 12/04/2016   PLT 191 12/04/2016  Lab Results  Component Value Date   CREATININE 0.76 12/05/2016    Lab Results  Component Value Date   PSA 10.73 (H) 05/19/2016    No results found for: TESTOSTERONE  Lab Results  Component Value Date   HGBA1C 6.0 05/19/2016    Urinalysis    Component Value Date/Time   COLORURINE YELLOW (A) 12/01/2016 1121   APPEARANCEUR CLEAR (A) 12/01/2016 1121   APPEARANCEUR Hazy (A) 11/11/2016 1412   LABSPEC 1.011 12/01/2016 1121   PHURINE 7.0 12/01/2016 1121   GLUCOSEU NEGATIVE 12/01/2016 1121   GLUCOSEU NEGATIVE 05/19/2016 1610   HGBUR NEGATIVE 12/01/2016 1121   BILIRUBINUR NEGATIVE 12/01/2016 1121   BILIRUBINUR Negative 11/11/2016 1412   KETONESUR NEGATIVE 12/01/2016 1121   PROTEINUR NEGATIVE 12/01/2016 1121   UROBILINOGEN 0.2 05/19/2016 1610   NITRITE POSITIVE (A) 12/01/2016 1121   LEUKOCYTESUR SMALL (A) 12/01/2016 1121   LEUKOCYTESUR Negative 11/11/2016 1412    Pertinent Imaging: CT pelvis/bone scan negative for metastatic disease.  Assessment & Plan:    1. High risk prostate cancer I had a long discussion with the patient and his wife regarding his CT and bone scan results. The patient has high-risk nonmetastatic prostate cancer. He understands he  is a candidate for either radical prostatectomy with lymph node dissection versus radiation therapy. We discussed the risks and benefits of robotic prostatectomy and lymph node dissection. He understands the risks include long-term incontinence and erectile dysfunction. The patient has erectile dysfunction at baseline and is unable to obtain an erection on a regular basis. He also has multiple comorbidities. I think given his age and his medical comorbidities that radiation therapy should be something that he strongly considers. We will send him to see Dr. Baruch Gouty, who is our radiation oncologist, to further discuss radiation therapy with him. He will return in 1 month after having the opportunity to discuss radiation therapy to further discuss his options.  2. BPH Continue Flomax  3. Post biopsy sepsis -The patient had ESBL sepsis after his prostate biopsy. This organism was resistant to all oral antibiotics. It was sensitive to gentamicin however the dose given at the time of biopsy was not sufficient to prevent sepsis. -If the patient needs fiduciary markers for radiation therapy, he would be best served by a larger dose of gentamicin at the time of fiduciary marker placement and possibly a dose of gentamicin a day prior as well.  Return in about 4 weeks (around 01/19/2017).  Nickie Retort, MD  [facility] [address] [place], [place] [address] [phone]

## 2016-12-29 ENCOUNTER — Other Ambulatory Visit: Payer: Self-pay | Admitting: *Deleted

## 2016-12-29 ENCOUNTER — Encounter: Payer: Self-pay | Admitting: Radiation Oncology

## 2016-12-29 ENCOUNTER — Ambulatory Visit
Admission: RE | Admit: 2016-12-29 | Discharge: 2016-12-29 | Disposition: A | Discharge status: Home / Self Care | Payer: Medicare Other | Source: Ambulatory Visit | Attending: Radiation Oncology | Admitting: Radiation Oncology

## 2016-12-29 VITALS — BP 160/79 | HR 77 | Temp 96.9°F | Resp 20 | Wt 212.5 lb

## 2016-12-29 DIAGNOSIS — F419 Anxiety disorder, unspecified: Secondary | ICD-10-CM | POA: Diagnosis not present

## 2016-12-29 DIAGNOSIS — G4730 Sleep apnea, unspecified: Secondary | ICD-10-CM | POA: Diagnosis not present

## 2016-12-29 DIAGNOSIS — Z79899 Other long term (current) drug therapy: Secondary | ICD-10-CM | POA: Insufficient documentation

## 2016-12-29 DIAGNOSIS — G629 Polyneuropathy, unspecified: Secondary | ICD-10-CM | POA: Insufficient documentation

## 2016-12-29 DIAGNOSIS — M549 Dorsalgia, unspecified: Secondary | ICD-10-CM | POA: Diagnosis not present

## 2016-12-29 DIAGNOSIS — I252 Old myocardial infarction: Secondary | ICD-10-CM | POA: Insufficient documentation

## 2016-12-29 DIAGNOSIS — C61 Malignant neoplasm of prostate: Secondary | ICD-10-CM

## 2016-12-29 DIAGNOSIS — J42 Unspecified chronic bronchitis: Secondary | ICD-10-CM | POA: Diagnosis not present

## 2016-12-29 DIAGNOSIS — Z7982 Long term (current) use of aspirin: Secondary | ICD-10-CM | POA: Insufficient documentation

## 2016-12-29 DIAGNOSIS — I10 Essential (primary) hypertension: Secondary | ICD-10-CM | POA: Diagnosis not present

## 2016-12-29 DIAGNOSIS — R011 Cardiac murmur, unspecified: Secondary | ICD-10-CM | POA: Diagnosis not present

## 2016-12-29 DIAGNOSIS — N529 Male erectile dysfunction, unspecified: Secondary | ICD-10-CM | POA: Insufficient documentation

## 2016-12-29 DIAGNOSIS — M129 Arthropathy, unspecified: Secondary | ICD-10-CM | POA: Diagnosis not present

## 2016-12-29 DIAGNOSIS — G8929 Other chronic pain: Secondary | ICD-10-CM | POA: Insufficient documentation

## 2016-12-29 DIAGNOSIS — E7800 Pure hypercholesterolemia, unspecified: Secondary | ICD-10-CM | POA: Insufficient documentation

## 2016-12-29 DIAGNOSIS — I2510 Atherosclerotic heart disease of native coronary artery without angina pectoris: Secondary | ICD-10-CM | POA: Diagnosis not present

## 2016-12-29 DIAGNOSIS — Z87891 Personal history of nicotine dependence: Secondary | ICD-10-CM | POA: Insufficient documentation

## 2016-12-29 DIAGNOSIS — Z8041 Family history of malignant neoplasm of ovary: Secondary | ICD-10-CM | POA: Diagnosis not present

## 2016-12-29 DIAGNOSIS — K219 Gastro-esophageal reflux disease without esophagitis: Secondary | ICD-10-CM | POA: Insufficient documentation

## 2016-12-29 DIAGNOSIS — Z8701 Personal history of pneumonia (recurrent): Secondary | ICD-10-CM | POA: Insufficient documentation

## 2016-12-29 DIAGNOSIS — Z510 Encounter for antineoplastic radiation therapy: Secondary | ICD-10-CM | POA: Diagnosis not present

## 2016-12-29 DIAGNOSIS — K449 Diaphragmatic hernia without obstruction or gangrene: Secondary | ICD-10-CM | POA: Insufficient documentation

## 2016-12-29 DIAGNOSIS — F329 Major depressive disorder, single episode, unspecified: Secondary | ICD-10-CM | POA: Diagnosis not present

## 2016-12-29 HISTORY — DX: Malignant neoplasm of prostate: C61

## 2016-12-29 NOTE — Consult Note (Signed)
NEW PATIENT EVALUATION  Name: Albert Wade  [name]: [id number]  Date:   12/29/2016     DOB: [birth year]   This 71 y.o. 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  REFERRING PHYSICIAN: Biagio Borg, MD  CHIEF COMPLAINT:  Chief Complaint  Patient presents with  . Prostate Cancer    Pt is here for initial consultation of prostate cancer.       DIAGNOSIS: The encounter diagnosis was Malignant neoplasm of prostate ([place]).   PREVIOUS INVESTIGATIONS:  Bone scan and CT scan reviewed Pathology report reviewed Clinical notes reviewed  HPI: Patient is a pleasant 71 year old male who presented to his PMD with an elevated PSA of 17.6. He was seen by urology and underwent transrectal ultrasound-guided biopsy showing 6 of 12 cores positive for adenocarcinoma most Gleason 8 (4+4). He had a CT scan of his abdomen pelvis which was negative as well as a bone scan showing no evidence of metastatic disease. Patient did develop some sepsis after his biopsies with urinary incontinence which is resolved with antibiotic therapy. He specifically denies urinary frequency urgency or nocturia. He does have erectile dysfunction. He is currently on Flomax a low although only takes that occasionally. He is seen today for consideration of treatment and radiation oncology opinion  PLANNED TREATMENT REGIMEN: Image guided I MRT radiation therapy with Lupron therapy  PAST MEDICAL HISTORY:  has a past medical history of Anxiety; Arthritis; BPH (benign prostatic hypertrophy); CAD (coronary artery disease); Chronic back pain; Chronic bronchitis; Depression; GERD (gastroesophageal reflux disease); H/O hiatal hernia; Heart murmur; Hypercholesteremia; Hypertension; MI (myocardial infarction); Peripheral neuropathy ([place]); Pneumonia; Prostate cancer ([place]); Sleep apnea; Umbilical hernia; and Varicose veins.     PAST SURGICAL HISTORY:  Past Surgical History:  Procedure Laterality Date  . ANGIOPLASTY    . APPENDECTOMY    . BACK SURGERY     x3  . CARDIAC CATHETERIZATION     11'11  . CIRCUMCISION N/A 10/21/2013   Procedure: CIRCUMCISION ADULT;  Surgeon: Bernestine Amass, MD;  Location[facility];  Service: Urology;  Laterality: N/A;  . CYSTOSCOPY N/A 10/21/2013   Procedure: CYSTOSCOPY FLEXIBLE;  Surgeon: Bernestine Amass, MD;  Location[facility];  Service: Urology;  Laterality: N/A;  . INSERTION OF MESH N/A 06/17/2016   Procedure: INSERTION OF MESH;  Surgeon: Excell Seltzer, MD;  Location: [facility];  Service: General;  Laterality: N/A;  . UMBILICAL HERNIA REPAIR N/A 06/17/2016   Procedure: REPAIR UMBILICAL HERNIA WITH MESH;  Surgeon: Excell Seltzer, MD;  Location: [facility];  Service: General;  Laterality: N/A;  . WRIST SURGERY Right     FAMILY HISTORY: family history includes Emphysema in his father; Hyperlipidemia in his father and mother; Prostate cancer in his father.  SOCIAL HISTORY:  reports that he quit smoking about 21 years ago. His smoking use included Cigarettes. He has a 20.00 pack-year smoking history. He has never used smokeless tobacco. He reports that he does not drink alcohol or use drugs.  ALLERGIES: Seroquel [quetiapine]  MEDICATIONS:  Current Outpatient Prescriptions  Medication Sig Dispense Refill  . albuterol (PROVENTIL HFA;VENTOLIN HFA) 108 (90 Base) MCG/ACT inhaler Inhale into the lungs every 6 (six) hours as needed for wheezing or shortness of breath.    . ALPRAZolam (XANAX) 1 MG tablet Take 1 tablet (1 mg total) by mouth 2 (two) times daily as needed for  anxiety. 30 tablet 2  . aspirin 81 MG tablet Take 81 mg by mouth daily.    . budesonide-formoterol (SYMBICORT) 160-4.5 MCG/ACT inhaler Inhale 2 puffs into the lungs 2 (two) times daily.    . DULoxetine (CYMBALTA) 30 MG capsule Take 30 mg by mouth daily.    . methadone (DOLOPHINE) 10 MG tablet Take 10 mg by mouth 4 (four)  times daily.     [place] oxyCODONE-acetaminophen (PERCOCET) 10-325 MG tablet Take 1 tablet by mouth every 4 (four) hours as needed for pain. 30 tablet 0  . simvastatin (ZOCOR) 80 MG tablet TAKE ONE TABLET BY MOUTH EVERY EVENING. 90 tablet 2  . furosemide (LASIX) 40 MG tablet Take1 tablet on day one(40 mg)Then take1 and1/2 tab on day two(60mg )and then take two tablets on daythree(80mg ).Then go back to day one (Patient not taking: Reported on 12/29/2016) 165 tablet 5  . potassium chloride (KLOR-CON 10) 10 MEQ tablet 2 tabs by mouth per day on days taking furosemide (Patient not taking: Reported on 12/29/2016) 180 tablet 3  . tamsulosin (FLOMAX) 0.4 MG CAPS capsule Take 1 capsule (0.4 mg total) by mouth daily. (Patient not taking: Reported on 12/29/2016) 30 capsule 11  . zolpidem (AMBIEN) 5 MG tablet Take 1 tablet (5 mg total) by mouth at bedtime as needed. for sleep (Patient not taking: Reported on 12/29/2016) 90 tablet 1   No current facility-administered medications for this encounter.     ECOG PERFORMANCE STATUS:  0 - Asymptomatic  REVIEW OF SYSTEMS:  Patient denies any weight loss, fatigue, weakness, fever, chills or night sweats. Patient denies any loss of vision, blurred vision. Patient denies any ringing  of the ears or hearing loss. No irregular heartbeat. Patient denies heart murmur or history of fainting. Patient denies any chest pain or pain radiating to her upper extremities. Patient denies any shortness of breath, difficulty breathing at night, cough or hemoptysis. Patient denies any swelling in the lower legs. Patient denies any nausea vomiting, vomiting of blood, or coffee ground material in the vomitus. Patient denies any stomach pain. Patient states has had normal bowel movements no significant constipation or diarrhea. Patient denies any dysuria, hematuria or significant nocturia. Patient denies any problems walking, swelling in the joints or loss of balance. Patient denies any skin changes, loss of  hair or loss of weight. Patient denies any excessive worrying or anxiety or significant depression. Patient denies any problems with insomnia. Patient denies excessive thirst, polyuria, polydipsia. Patient denies any swollen glands, patient denies easy bruising or easy bleeding. Patient denies any recent infections, allergies or URI. Patient "s visual fields have not changed significantly in recent time.    PHYSICAL EXAM: BP (!) 160/79   Pulse 77   Temp (!) 96.9 F (36.1 C)   Resp 20   Wt 212 lb 8.4 oz (96.4 kg)   BMI 30.06 kg/m  On rectal exam rectal sphincter tone is good. Prostate is smooth contracted without evidence of nodularity or mass sulcus is preserved bilaterally. No other rectal abnormality is identified. Well-developed well-nourished patient in NAD. HEENT reveals PERLA, EOMI, discs not visualized.  Oral cavity is clear. No oral mucosal lesions are identified. Neck is clear without evidence of cervical or supraclavicular adenopathy. Lungs are clear to A&P. Cardiac examination is essentially unremarkable with regular rate and rhythm without murmur rub or thrill. Abdomen is benign with no organomegaly or masses noted. Motor sensory and DTR levels are equal and symmetric in the upper and lower extremities. Cranial nerves  II through XII are grossly intact. Proprioception is intact. No peripheral adenopathy or edema is identified. No motor or sensory levels are noted. Crude visual fields are within normal range.  LABORATORY DATA: Pathology reports reviewed    RADIOLOGY RESULTS: Bone scan and CT scan reviewed   IMPRESSION: Stage IIB Gleason 8 adenocarcinoma the prostate in 71 year old male  PLAN: At this time I have run the [facility]. This shows only a 13% chance of organ confined disease with an 84% chance of extracapsular extension. There is also a 30% chance of lymph node involvement. Based on those findings I have recommended image guided I MRT radiation  therapy. Would plan on delivering 8000 cGy to his prostate as well as 5400 cGy to his pelvic nodes using I M RT dose painting technique. I've asked urology to place fiduciary markers for daily image guided treatment and first thing ordered CT simulation shortly thereafter. I've also ordered Lupron and would like to keep the patient suppressed for about 2 years. Risks and benefits of treatment including increased lower urinary tract symptoms, diarrhea fatigue alteration of blood counts skin reaction all were discussed in detail with the patient and his wife. Both seem to comprehend my treatment plan well.  I would like to take this opportunity to thank you for allowing me to participate in the care of your patient.Armstead Peaks., MD

## 2016-12-30 NOTE — Progress Notes (Signed)
  Oncology Nurse Navigator Documentation Met with Albert Wade and his spouse before and during consult with Dr. Baruch Gouty. Introduced nurse navigator services and provided contact information for any future needs. All questions answered at present. Navigator Location: [facility] (12/30/16 1000) Referral date to RadOnc/MedOnc: 12/22/16 (12/30/16 1000) )Navigator Encounter Type: Initial RadOnc (12/30/16 1000)     Confirmed Diagnosis Date: 12/01/16 (12/30/16 1000)               Patient Visit Type: RadOnc;Initial (12/30/16 1000)   Barriers/Navigation Needs: Education (12/30/16 1000)                Acuity: Level 2 (12/30/16 1000)   Acuity Level 2: Initial guidance, education and coordination as needed;Educational needs;Ongoing guidance and education throughout treatment as needed (12/30/16 1000)     Time Spent with Patient: 30 (12/30/16 1000)

## 2017-01-02 ENCOUNTER — Inpatient Hospital Stay: Payer: Medicare Other | Attending: Radiation Oncology

## 2017-01-02 DIAGNOSIS — C61 Malignant neoplasm of prostate: Secondary | ICD-10-CM | POA: Diagnosis present

## 2017-01-02 DIAGNOSIS — Z79818 Long term (current) use of other agents affecting estrogen receptors and estrogen levels: Secondary | ICD-10-CM | POA: Diagnosis not present

## 2017-01-02 MED ORDER — LEUPROLIDE ACETATE (4 MONTH) 30 MG IM KIT
30.0000 mg | PACK | Freq: Once | INTRAMUSCULAR | Status: AC
  Administered 2017-01-02: 30 mg via INTRAMUSCULAR
  Filled 2017-01-02: qty 30

## 2017-01-18 ENCOUNTER — Encounter: Payer: Self-pay | Admitting: Urology

## 2017-01-18 ENCOUNTER — Ambulatory Visit (INDEPENDENT_AMBULATORY_CARE_PROVIDER_SITE_OTHER): Payer: Medicare Other | Admitting: Urology

## 2017-01-18 VITALS — BP 173/77 | HR 76 | Ht 70.0 in | Wt 213.0 lb

## 2017-01-18 DIAGNOSIS — C61 Malignant neoplasm of prostate: Secondary | ICD-10-CM

## 2017-01-18 MED ORDER — GENTAMICIN SULFATE 40 MG/ML IJ SOLN
80.0000 mg | Freq: Once | INTRAMUSCULAR | Status: AC
  Administered 2017-01-18: 80 mg via INTRAMUSCULAR

## 2017-01-18 MED ORDER — FOSFOMYCIN TROMETHAMINE 3 G PO PACK: 3.0000 g | PACK | Freq: Once | ORAL | 0 refills | Status: DC

## 2017-01-18 MED ORDER — LEVOFLOXACIN 500 MG PO TABS
500.0000 mg | ORAL_TABLET | Freq: Once | ORAL | Status: AC
  Administered 2017-01-18: 500 mg via ORAL

## 2017-01-18 MED ORDER — FOSFOMYCIN TROMETHAMINE 3 G PO PACK: 3.0000 g | PACK | Freq: Once | ORAL | Status: DC

## 2017-01-18 NOTE — Progress Notes (Signed)
01/18/17  CC: gold seed markers  HPI: 71 y.o. male with prostate cancer who presents today for placement of fiducial seed markers in anticipation of his upcoming IMRT with Dr. Baruch Gouty.  He does have history of sepsis following prostate biopsy in the past.  E. Coli resistant to all oral medications, sensitive to gentamycin.    Prostate Gold Seed Marker Placement Procedure   Informed consent was obtained after discussing risks/benefits of the procedure, especially the risk of sepsis given his history.  A time out was performed to ensure correct patient identity.    Pre-Procedure: - Gentamicin given prophylactically (160 mg) - PO Levaquin 500 mg also given today  Procedure: -Lidocaine jelly was administered per rectum -Rectal ultrasound probe was placed without difficulty and the prostate visualized - 3 fiducial gold seed markers placed, one at right base, one at left base, one at apex of prostate gland under transrectal ultrasound guidance  Post-Procedure: - Patient tolerated the procedure well - He was counseled to seek immediate medical attention if experiences any severe pain, significant bleeding, or fevers -Prescribed Phosphomycin 3 g to take immediately after biopsy today as precaution given history -CAREFUL instructions given about return precautions to ED  Hollice Espy, MD

## 2017-01-20 ENCOUNTER — Ambulatory Visit: Payer: Medicare HMO

## 2017-01-23 ENCOUNTER — Ambulatory Visit
Admission: RE | Admit: 2017-01-23 | Discharge: 2017-01-23 | Disposition: A | Discharge status: Home / Self Care | Payer: Medicare Other | Source: Ambulatory Visit | Attending: Radiation Oncology | Admitting: Radiation Oncology

## 2017-01-23 DIAGNOSIS — C61 Malignant neoplasm of prostate: Secondary | ICD-10-CM | POA: Diagnosis not present

## 2017-01-25 DIAGNOSIS — C61 Malignant neoplasm of prostate: Secondary | ICD-10-CM | POA: Diagnosis not present

## 2017-01-27 ENCOUNTER — Other Ambulatory Visit: Payer: Self-pay | Admitting: *Deleted

## 2017-01-27 DIAGNOSIS — C61 Malignant neoplasm of prostate: Secondary | ICD-10-CM | POA: Insufficient documentation

## 2017-02-01 ENCOUNTER — Ambulatory Visit
Admission: RE | Admit: 2017-02-01 | Discharge: 2017-02-01 | Disposition: A | Discharge status: Home / Self Care | Payer: Medicare Other | Source: Ambulatory Visit | Attending: Radiation Oncology | Admitting: Radiation Oncology

## 2017-02-02 ENCOUNTER — Other Ambulatory Visit: Payer: Medicare HMO

## 2017-02-02 ENCOUNTER — Ambulatory Visit
Admission: RE | Admit: 2017-02-02 | Discharge: 2017-02-02 | Disposition: A | Discharge status: Home / Self Care | Payer: Medicare Other | Source: Ambulatory Visit | Attending: Radiation Oncology | Admitting: Radiation Oncology

## 2017-02-02 DIAGNOSIS — C61 Malignant neoplasm of prostate: Secondary | ICD-10-CM | POA: Diagnosis not present

## 2017-02-03 ENCOUNTER — Ambulatory Visit
Admission: RE | Admit: 2017-02-03 | Discharge: 2017-02-03 | Disposition: A | Discharge status: Home / Self Care | Payer: Medicare Other | Source: Ambulatory Visit | Attending: Radiation Oncology | Admitting: Radiation Oncology

## 2017-02-03 DIAGNOSIS — C61 Malignant neoplasm of prostate: Secondary | ICD-10-CM | POA: Diagnosis not present

## 2017-02-06 ENCOUNTER — Ambulatory Visit: Payer: Medicare Other

## 2017-02-06 ENCOUNTER — Ambulatory Visit
Admission: RE | Admit: 2017-02-06 | Discharge: 2017-02-06 | Disposition: A | Discharge status: Home / Self Care | Payer: Medicare Other | Source: Ambulatory Visit | Attending: Radiation Oncology | Admitting: Radiation Oncology

## 2017-02-06 DIAGNOSIS — C61 Malignant neoplasm of prostate: Secondary | ICD-10-CM | POA: Diagnosis not present

## 2017-02-07 ENCOUNTER — Ambulatory Visit
Admission: RE | Admit: 2017-02-07 | Discharge: 2017-02-07 | Disposition: A | Discharge status: Home / Self Care | Payer: Medicare Other | Source: Ambulatory Visit | Attending: Radiation Oncology | Admitting: Radiation Oncology

## 2017-02-07 DIAGNOSIS — C61 Malignant neoplasm of prostate: Secondary | ICD-10-CM | POA: Diagnosis not present

## 2017-02-08 ENCOUNTER — Ambulatory Visit
Admission: RE | Admit: 2017-02-08 | Discharge: 2017-02-08 | Disposition: A | Discharge status: Home / Self Care | Payer: Medicare Other | Source: Ambulatory Visit | Attending: Radiation Oncology | Admitting: Radiation Oncology

## 2017-02-08 DIAGNOSIS — C61 Malignant neoplasm of prostate: Secondary | ICD-10-CM | POA: Diagnosis not present

## 2017-02-09 ENCOUNTER — Ambulatory Visit
Admission: RE | Admit: 2017-02-09 | Discharge: 2017-02-09 | Disposition: A | Discharge status: Home / Self Care | Payer: Medicare Other | Source: Ambulatory Visit | Attending: Radiation Oncology | Admitting: Radiation Oncology

## 2017-02-09 DIAGNOSIS — C61 Malignant neoplasm of prostate: Secondary | ICD-10-CM | POA: Diagnosis not present

## 2017-02-10 ENCOUNTER — Telehealth: Payer: Self-pay

## 2017-02-10 ENCOUNTER — Other Ambulatory Visit: Payer: Self-pay | Admitting: Internal Medicine

## 2017-02-10 ENCOUNTER — Ambulatory Visit
Admission: RE | Admit: 2017-02-10 | Discharge: 2017-02-10 | Disposition: A | Discharge status: Home / Self Care | Payer: Medicare Other | Source: Ambulatory Visit | Attending: Radiation Oncology | Admitting: Radiation Oncology

## 2017-02-10 DIAGNOSIS — C61 Malignant neoplasm of prostate: Secondary | ICD-10-CM | POA: Diagnosis not present

## 2017-02-10 NOTE — Telephone Encounter (Signed)
Patient called in requested refill for [facility], routed to dr crawford, signed and [facility]ed [facility] per [facility]/wife request

## 2017-02-10 NOTE — Telephone Encounter (Signed)
Overdue for visit so would recommend he schedule visit with Dr. Jenny Reichmann for further refills.

## 2017-02-13 ENCOUNTER — Ambulatory Visit
Admission: RE | Admit: 2017-02-13 | Discharge: 2017-02-13 | Disposition: A | Discharge status: Home / Self Care | Payer: Medicare Other | Source: Ambulatory Visit | Attending: Radiation Oncology | Admitting: Radiation Oncology

## 2017-02-13 DIAGNOSIS — C61 Malignant neoplasm of prostate: Secondary | ICD-10-CM | POA: Diagnosis not present

## 2017-02-14 ENCOUNTER — Ambulatory Visit
Admission: RE | Admit: 2017-02-14 | Discharge: 2017-02-14 | Disposition: A | Discharge status: Home / Self Care | Payer: Medicare Other | Source: Ambulatory Visit | Attending: Radiation Oncology | Admitting: Radiation Oncology

## 2017-02-14 ENCOUNTER — Other Ambulatory Visit: Payer: Self-pay | Admitting: *Deleted

## 2017-02-14 DIAGNOSIS — C61 Malignant neoplasm of prostate: Secondary | ICD-10-CM | POA: Diagnosis not present

## 2017-02-14 MED ORDER — MIRABEGRON ER 25 MG PO TB24: 25.0000 mg | ORAL_TABLET | Freq: Every day | ORAL | 5 refills | Status: DC

## 2017-02-15 ENCOUNTER — Ambulatory Visit
Admission: RE | Admit: 2017-02-15 | Discharge: 2017-02-15 | Disposition: A | Discharge status: Home / Self Care | Payer: Medicare Other | Source: Ambulatory Visit | Attending: Radiation Oncology | Admitting: Radiation Oncology

## 2017-02-15 DIAGNOSIS — C61 Malignant neoplasm of prostate: Secondary | ICD-10-CM | POA: Diagnosis not present

## 2017-02-16 ENCOUNTER — Ambulatory Visit
Admission: RE | Admit: 2017-02-16 | Discharge: 2017-02-16 | Disposition: A | Discharge status: Home / Self Care | Payer: Medicare Other | Source: Ambulatory Visit | Attending: Radiation Oncology | Admitting: Radiation Oncology

## 2017-02-16 ENCOUNTER — Other Ambulatory Visit: Payer: Self-pay | Admitting: Internal Medicine

## 2017-02-16 ENCOUNTER — Inpatient Hospital Stay: Payer: Medicare Other | Attending: Radiation Oncology

## 2017-02-16 DIAGNOSIS — C61 Malignant neoplasm of prostate: Secondary | ICD-10-CM | POA: Insufficient documentation

## 2017-02-16 LAB — CBC
HCT: 36.5 % — ABNORMAL LOW (ref 40.0–52.0)
Hemoglobin: 12.5 g/dL — ABNORMAL LOW (ref 13.0–18.0)
MCH: 30.3 pg (ref 26.0–34.0)
MCHC: 34.3 g/dL (ref 32.0–36.0)
MCV: 88.4 fL (ref 80.0–100.0)
Platelets: 151 10*3/uL (ref 150–440)
RBC: 4.13 MIL/uL — ABNORMAL LOW (ref 4.40–5.90)
RDW: 13.4 % (ref 11.5–14.5)
WBC: 4.8 10*3/uL (ref 3.8–10.6)

## 2017-02-16 NOTE — Telephone Encounter (Signed)
I had to decline the irbesartan request as this medication was not included in the d/c summary from last hospn  Please ask pt to make ROV at his convenience, or sooner if BP > 140/90

## 2017-02-16 NOTE — Telephone Encounter (Signed)
Routing to dr Jenny Reichmann, this med is not patient's current med list, please advise, thanks

## 2017-02-17 ENCOUNTER — Encounter: Payer: Self-pay | Admitting: Internal Medicine

## 2017-02-17 ENCOUNTER — Ambulatory Visit
Admission: RE | Admit: 2017-02-17 | Discharge: 2017-02-17 | Disposition: A | Discharge status: Home / Self Care | Payer: Medicare Other | Source: Ambulatory Visit | Attending: Radiation Oncology | Admitting: Radiation Oncology

## 2017-02-17 DIAGNOSIS — C61 Malignant neoplasm of prostate: Secondary | ICD-10-CM | POA: Diagnosis not present

## 2017-02-17 NOTE — Telephone Encounter (Signed)
Patient advised, I have made follow up appt with dr Jenny Reichmann

## 2017-02-20 ENCOUNTER — Ambulatory Visit
Admission: RE | Admit: 2017-02-20 | Discharge: 2017-02-20 | Disposition: A | Discharge status: Home / Self Care | Payer: Medicare Other | Source: Ambulatory Visit | Attending: Radiation Oncology | Admitting: Radiation Oncology

## 2017-02-20 DIAGNOSIS — C61 Malignant neoplasm of prostate: Secondary | ICD-10-CM | POA: Diagnosis not present

## 2017-02-21 ENCOUNTER — Ambulatory Visit
Admission: RE | Admit: 2017-02-21 | Discharge: 2017-02-21 | Disposition: A | Discharge status: Home / Self Care | Payer: Medicare Other | Source: Ambulatory Visit | Attending: Radiation Oncology | Admitting: Radiation Oncology

## 2017-02-21 ENCOUNTER — Other Ambulatory Visit: Payer: Self-pay | Admitting: *Deleted

## 2017-02-21 DIAGNOSIS — C61 Malignant neoplasm of prostate: Secondary | ICD-10-CM | POA: Diagnosis not present

## 2017-02-21 MED ORDER — SUCRALFATE 1 G PO TABS: 1.0000 g | ORAL_TABLET | Freq: Three times a day (TID) | ORAL | 3 refills | Status: DC

## 2017-02-22 ENCOUNTER — Ambulatory Visit
Admission: RE | Admit: 2017-02-22 | Discharge: 2017-02-22 | Disposition: A | Discharge status: Home / Self Care | Payer: Medicare Other | Source: Ambulatory Visit | Attending: Radiation Oncology | Admitting: Radiation Oncology

## 2017-02-22 DIAGNOSIS — C61 Malignant neoplasm of prostate: Secondary | ICD-10-CM | POA: Diagnosis not present

## 2017-02-23 ENCOUNTER — Ambulatory Visit
Admission: RE | Admit: 2017-02-23 | Discharge: 2017-02-23 | Disposition: A | Discharge status: Home / Self Care | Payer: Medicare Other | Source: Ambulatory Visit | Attending: Radiation Oncology | Admitting: Radiation Oncology

## 2017-02-23 DIAGNOSIS — C61 Malignant neoplasm of prostate: Secondary | ICD-10-CM | POA: Diagnosis not present

## 2017-02-23 DIAGNOSIS — K219 Gastro-esophageal reflux disease without esophagitis: Secondary | ICD-10-CM | POA: Diagnosis not present

## 2017-02-23 DIAGNOSIS — M129 Arthropathy, unspecified: Secondary | ICD-10-CM | POA: Diagnosis not present

## 2017-02-23 DIAGNOSIS — M549 Dorsalgia, unspecified: Secondary | ICD-10-CM | POA: Diagnosis not present

## 2017-02-23 DIAGNOSIS — I252 Old myocardial infarction: Secondary | ICD-10-CM | POA: Diagnosis not present

## 2017-02-23 DIAGNOSIS — G629 Polyneuropathy, unspecified: Secondary | ICD-10-CM | POA: Diagnosis not present

## 2017-02-23 DIAGNOSIS — Z79899 Other long term (current) drug therapy: Secondary | ICD-10-CM | POA: Diagnosis not present

## 2017-02-23 DIAGNOSIS — K449 Diaphragmatic hernia without obstruction or gangrene: Secondary | ICD-10-CM | POA: Diagnosis not present

## 2017-02-23 DIAGNOSIS — I2510 Atherosclerotic heart disease of native coronary artery without angina pectoris: Secondary | ICD-10-CM | POA: Diagnosis not present

## 2017-02-23 DIAGNOSIS — Z7982 Long term (current) use of aspirin: Secondary | ICD-10-CM | POA: Diagnosis not present

## 2017-02-23 DIAGNOSIS — G8929 Other chronic pain: Secondary | ICD-10-CM | POA: Diagnosis not present

## 2017-02-23 DIAGNOSIS — R011 Cardiac murmur, unspecified: Secondary | ICD-10-CM | POA: Diagnosis not present

## 2017-02-23 DIAGNOSIS — J42 Unspecified chronic bronchitis: Secondary | ICD-10-CM | POA: Diagnosis not present

## 2017-02-23 DIAGNOSIS — Z510 Encounter for antineoplastic radiation therapy: Secondary | ICD-10-CM | POA: Diagnosis not present

## 2017-02-23 DIAGNOSIS — G4730 Sleep apnea, unspecified: Secondary | ICD-10-CM | POA: Diagnosis not present

## 2017-02-23 DIAGNOSIS — I10 Essential (primary) hypertension: Secondary | ICD-10-CM | POA: Diagnosis not present

## 2017-02-23 DIAGNOSIS — E7800 Pure hypercholesterolemia, unspecified: Secondary | ICD-10-CM | POA: Diagnosis not present

## 2017-02-23 DIAGNOSIS — Z87891 Personal history of nicotine dependence: Secondary | ICD-10-CM | POA: Diagnosis not present

## 2017-02-24 ENCOUNTER — Ambulatory Visit
Admission: RE | Admit: 2017-02-24 | Discharge: 2017-02-24 | Disposition: A | Discharge status: Home / Self Care | Payer: Medicare Other | Source: Ambulatory Visit | Attending: Radiation Oncology | Admitting: Radiation Oncology

## 2017-02-24 ENCOUNTER — Ambulatory Visit: Payer: Medicare Other | Admitting: Internal Medicine

## 2017-02-24 DIAGNOSIS — E7800 Pure hypercholesterolemia, unspecified: Secondary | ICD-10-CM | POA: Diagnosis not present

## 2017-02-24 DIAGNOSIS — G629 Polyneuropathy, unspecified: Secondary | ICD-10-CM | POA: Diagnosis not present

## 2017-02-24 DIAGNOSIS — Z79899 Other long term (current) drug therapy: Secondary | ICD-10-CM | POA: Diagnosis not present

## 2017-02-24 DIAGNOSIS — K219 Gastro-esophageal reflux disease without esophagitis: Secondary | ICD-10-CM | POA: Diagnosis not present

## 2017-02-24 DIAGNOSIS — G4730 Sleep apnea, unspecified: Secondary | ICD-10-CM | POA: Diagnosis not present

## 2017-02-24 DIAGNOSIS — C61 Malignant neoplasm of prostate: Secondary | ICD-10-CM | POA: Diagnosis not present

## 2017-02-24 DIAGNOSIS — I10 Essential (primary) hypertension: Secondary | ICD-10-CM | POA: Diagnosis not present

## 2017-02-24 DIAGNOSIS — M129 Arthropathy, unspecified: Secondary | ICD-10-CM | POA: Diagnosis not present

## 2017-02-24 DIAGNOSIS — Z87891 Personal history of nicotine dependence: Secondary | ICD-10-CM | POA: Diagnosis not present

## 2017-02-24 DIAGNOSIS — G8929 Other chronic pain: Secondary | ICD-10-CM | POA: Diagnosis not present

## 2017-02-24 DIAGNOSIS — K449 Diaphragmatic hernia without obstruction or gangrene: Secondary | ICD-10-CM | POA: Diagnosis not present

## 2017-02-24 DIAGNOSIS — R011 Cardiac murmur, unspecified: Secondary | ICD-10-CM | POA: Diagnosis not present

## 2017-02-24 DIAGNOSIS — I2510 Atherosclerotic heart disease of native coronary artery without angina pectoris: Secondary | ICD-10-CM | POA: Diagnosis not present

## 2017-02-24 DIAGNOSIS — M549 Dorsalgia, unspecified: Secondary | ICD-10-CM | POA: Diagnosis not present

## 2017-02-24 DIAGNOSIS — Z7982 Long term (current) use of aspirin: Secondary | ICD-10-CM | POA: Diagnosis not present

## 2017-02-24 DIAGNOSIS — I252 Old myocardial infarction: Secondary | ICD-10-CM | POA: Diagnosis not present

## 2017-02-24 DIAGNOSIS — J42 Unspecified chronic bronchitis: Secondary | ICD-10-CM | POA: Diagnosis not present

## 2017-02-24 DIAGNOSIS — Z510 Encounter for antineoplastic radiation therapy: Secondary | ICD-10-CM | POA: Diagnosis not present

## 2017-02-27 ENCOUNTER — Ambulatory Visit
Admission: RE | Admit: 2017-02-27 | Discharge: 2017-02-27 | Disposition: A | Discharge status: Home / Self Care | Payer: Medicare Other | Source: Ambulatory Visit | Attending: Radiation Oncology | Admitting: Radiation Oncology

## 2017-02-27 DIAGNOSIS — I2510 Atherosclerotic heart disease of native coronary artery without angina pectoris: Secondary | ICD-10-CM | POA: Diagnosis not present

## 2017-02-27 DIAGNOSIS — I10 Essential (primary) hypertension: Secondary | ICD-10-CM | POA: Diagnosis not present

## 2017-02-27 DIAGNOSIS — Z510 Encounter for antineoplastic radiation therapy: Secondary | ICD-10-CM | POA: Diagnosis not present

## 2017-02-27 DIAGNOSIS — K219 Gastro-esophageal reflux disease without esophagitis: Secondary | ICD-10-CM | POA: Diagnosis not present

## 2017-02-27 DIAGNOSIS — J42 Unspecified chronic bronchitis: Secondary | ICD-10-CM | POA: Diagnosis not present

## 2017-02-27 DIAGNOSIS — Z87891 Personal history of nicotine dependence: Secondary | ICD-10-CM | POA: Diagnosis not present

## 2017-02-27 DIAGNOSIS — Z79899 Other long term (current) drug therapy: Secondary | ICD-10-CM | POA: Diagnosis not present

## 2017-02-27 DIAGNOSIS — K449 Diaphragmatic hernia without obstruction or gangrene: Secondary | ICD-10-CM | POA: Diagnosis not present

## 2017-02-27 DIAGNOSIS — R011 Cardiac murmur, unspecified: Secondary | ICD-10-CM | POA: Diagnosis not present

## 2017-02-27 DIAGNOSIS — C61 Malignant neoplasm of prostate: Secondary | ICD-10-CM | POA: Diagnosis not present

## 2017-02-27 DIAGNOSIS — Z7982 Long term (current) use of aspirin: Secondary | ICD-10-CM | POA: Diagnosis not present

## 2017-02-27 DIAGNOSIS — G8929 Other chronic pain: Secondary | ICD-10-CM | POA: Diagnosis not present

## 2017-02-27 DIAGNOSIS — M129 Arthropathy, unspecified: Secondary | ICD-10-CM | POA: Diagnosis not present

## 2017-02-27 DIAGNOSIS — M549 Dorsalgia, unspecified: Secondary | ICD-10-CM | POA: Diagnosis not present

## 2017-02-27 DIAGNOSIS — I252 Old myocardial infarction: Secondary | ICD-10-CM | POA: Diagnosis not present

## 2017-02-27 DIAGNOSIS — G629 Polyneuropathy, unspecified: Secondary | ICD-10-CM | POA: Diagnosis not present

## 2017-02-27 DIAGNOSIS — G4730 Sleep apnea, unspecified: Secondary | ICD-10-CM | POA: Diagnosis not present

## 2017-02-27 DIAGNOSIS — E7800 Pure hypercholesterolemia, unspecified: Secondary | ICD-10-CM | POA: Diagnosis not present

## 2017-02-28 ENCOUNTER — Ambulatory Visit
Admission: RE | Admit: 2017-02-28 | Discharge: 2017-02-28 | Disposition: A | Discharge status: Home / Self Care | Payer: Medicare Other | Source: Ambulatory Visit | Attending: Radiation Oncology | Admitting: Radiation Oncology

## 2017-02-28 DIAGNOSIS — I10 Essential (primary) hypertension: Secondary | ICD-10-CM | POA: Diagnosis not present

## 2017-02-28 DIAGNOSIS — I2510 Atherosclerotic heart disease of native coronary artery without angina pectoris: Secondary | ICD-10-CM | POA: Diagnosis not present

## 2017-02-28 DIAGNOSIS — Z7982 Long term (current) use of aspirin: Secondary | ICD-10-CM | POA: Diagnosis not present

## 2017-02-28 DIAGNOSIS — Z510 Encounter for antineoplastic radiation therapy: Secondary | ICD-10-CM | POA: Diagnosis not present

## 2017-02-28 DIAGNOSIS — K219 Gastro-esophageal reflux disease without esophagitis: Secondary | ICD-10-CM | POA: Diagnosis not present

## 2017-02-28 DIAGNOSIS — J42 Unspecified chronic bronchitis: Secondary | ICD-10-CM | POA: Diagnosis not present

## 2017-02-28 DIAGNOSIS — M129 Arthropathy, unspecified: Secondary | ICD-10-CM | POA: Diagnosis not present

## 2017-02-28 DIAGNOSIS — G4730 Sleep apnea, unspecified: Secondary | ICD-10-CM | POA: Diagnosis not present

## 2017-02-28 DIAGNOSIS — G629 Polyneuropathy, unspecified: Secondary | ICD-10-CM | POA: Diagnosis not present

## 2017-02-28 DIAGNOSIS — E7800 Pure hypercholesterolemia, unspecified: Secondary | ICD-10-CM | POA: Diagnosis not present

## 2017-02-28 DIAGNOSIS — M549 Dorsalgia, unspecified: Secondary | ICD-10-CM | POA: Diagnosis not present

## 2017-02-28 DIAGNOSIS — Z79899 Other long term (current) drug therapy: Secondary | ICD-10-CM | POA: Diagnosis not present

## 2017-02-28 DIAGNOSIS — K449 Diaphragmatic hernia without obstruction or gangrene: Secondary | ICD-10-CM | POA: Diagnosis not present

## 2017-02-28 DIAGNOSIS — G8929 Other chronic pain: Secondary | ICD-10-CM | POA: Diagnosis not present

## 2017-02-28 DIAGNOSIS — Z87891 Personal history of nicotine dependence: Secondary | ICD-10-CM | POA: Diagnosis not present

## 2017-02-28 DIAGNOSIS — I252 Old myocardial infarction: Secondary | ICD-10-CM | POA: Diagnosis not present

## 2017-02-28 DIAGNOSIS — C61 Malignant neoplasm of prostate: Secondary | ICD-10-CM | POA: Diagnosis not present

## 2017-02-28 DIAGNOSIS — R011 Cardiac murmur, unspecified: Secondary | ICD-10-CM | POA: Diagnosis not present

## 2017-03-01 ENCOUNTER — Ambulatory Visit
Admission: RE | Admit: 2017-03-01 | Discharge: 2017-03-01 | Disposition: A | Discharge status: Home / Self Care | Payer: Medicare Other | Source: Ambulatory Visit | Attending: Radiation Oncology | Admitting: Radiation Oncology

## 2017-03-01 DIAGNOSIS — I10 Essential (primary) hypertension: Secondary | ICD-10-CM | POA: Diagnosis not present

## 2017-03-01 DIAGNOSIS — J42 Unspecified chronic bronchitis: Secondary | ICD-10-CM | POA: Diagnosis not present

## 2017-03-01 DIAGNOSIS — G629 Polyneuropathy, unspecified: Secondary | ICD-10-CM | POA: Diagnosis not present

## 2017-03-01 DIAGNOSIS — K219 Gastro-esophageal reflux disease without esophagitis: Secondary | ICD-10-CM | POA: Diagnosis not present

## 2017-03-01 DIAGNOSIS — C61 Malignant neoplasm of prostate: Secondary | ICD-10-CM | POA: Diagnosis not present

## 2017-03-01 DIAGNOSIS — G8929 Other chronic pain: Secondary | ICD-10-CM | POA: Diagnosis not present

## 2017-03-01 DIAGNOSIS — Z79899 Other long term (current) drug therapy: Secondary | ICD-10-CM | POA: Diagnosis not present

## 2017-03-01 DIAGNOSIS — Z7982 Long term (current) use of aspirin: Secondary | ICD-10-CM | POA: Diagnosis not present

## 2017-03-01 DIAGNOSIS — M129 Arthropathy, unspecified: Secondary | ICD-10-CM | POA: Diagnosis not present

## 2017-03-01 DIAGNOSIS — Z87891 Personal history of nicotine dependence: Secondary | ICD-10-CM | POA: Diagnosis not present

## 2017-03-01 DIAGNOSIS — E7800 Pure hypercholesterolemia, unspecified: Secondary | ICD-10-CM | POA: Diagnosis not present

## 2017-03-01 DIAGNOSIS — I2510 Atherosclerotic heart disease of native coronary artery without angina pectoris: Secondary | ICD-10-CM | POA: Diagnosis not present

## 2017-03-01 DIAGNOSIS — K449 Diaphragmatic hernia without obstruction or gangrene: Secondary | ICD-10-CM | POA: Diagnosis not present

## 2017-03-01 DIAGNOSIS — Z510 Encounter for antineoplastic radiation therapy: Secondary | ICD-10-CM | POA: Diagnosis not present

## 2017-03-01 DIAGNOSIS — M549 Dorsalgia, unspecified: Secondary | ICD-10-CM | POA: Diagnosis not present

## 2017-03-01 DIAGNOSIS — R011 Cardiac murmur, unspecified: Secondary | ICD-10-CM | POA: Diagnosis not present

## 2017-03-01 DIAGNOSIS — G4730 Sleep apnea, unspecified: Secondary | ICD-10-CM | POA: Diagnosis not present

## 2017-03-01 DIAGNOSIS — I252 Old myocardial infarction: Secondary | ICD-10-CM | POA: Diagnosis not present

## 2017-03-02 ENCOUNTER — Inpatient Hospital Stay: Payer: Medicare Other | Attending: Radiation Oncology

## 2017-03-02 ENCOUNTER — Ambulatory Visit
Admission: RE | Admit: 2017-03-02 | Discharge: 2017-03-02 | Disposition: A | Discharge status: Home / Self Care | Payer: Medicare Other | Source: Ambulatory Visit | Attending: Radiation Oncology | Admitting: Radiation Oncology

## 2017-03-02 DIAGNOSIS — Z79899 Other long term (current) drug therapy: Secondary | ICD-10-CM | POA: Diagnosis not present

## 2017-03-02 DIAGNOSIS — C61 Malignant neoplasm of prostate: Secondary | ICD-10-CM

## 2017-03-02 DIAGNOSIS — I10 Essential (primary) hypertension: Secondary | ICD-10-CM | POA: Diagnosis not present

## 2017-03-02 DIAGNOSIS — J42 Unspecified chronic bronchitis: Secondary | ICD-10-CM | POA: Diagnosis not present

## 2017-03-02 DIAGNOSIS — Z7982 Long term (current) use of aspirin: Secondary | ICD-10-CM | POA: Diagnosis not present

## 2017-03-02 DIAGNOSIS — Z510 Encounter for antineoplastic radiation therapy: Secondary | ICD-10-CM | POA: Diagnosis not present

## 2017-03-02 DIAGNOSIS — K219 Gastro-esophageal reflux disease without esophagitis: Secondary | ICD-10-CM | POA: Diagnosis not present

## 2017-03-02 DIAGNOSIS — G629 Polyneuropathy, unspecified: Secondary | ICD-10-CM | POA: Diagnosis not present

## 2017-03-02 DIAGNOSIS — Z87891 Personal history of nicotine dependence: Secondary | ICD-10-CM | POA: Diagnosis not present

## 2017-03-02 DIAGNOSIS — K449 Diaphragmatic hernia without obstruction or gangrene: Secondary | ICD-10-CM | POA: Diagnosis not present

## 2017-03-02 DIAGNOSIS — R011 Cardiac murmur, unspecified: Secondary | ICD-10-CM | POA: Diagnosis not present

## 2017-03-02 DIAGNOSIS — G8929 Other chronic pain: Secondary | ICD-10-CM | POA: Diagnosis not present

## 2017-03-02 DIAGNOSIS — G4730 Sleep apnea, unspecified: Secondary | ICD-10-CM | POA: Diagnosis not present

## 2017-03-02 DIAGNOSIS — I2510 Atherosclerotic heart disease of native coronary artery without angina pectoris: Secondary | ICD-10-CM | POA: Diagnosis not present

## 2017-03-02 DIAGNOSIS — M549 Dorsalgia, unspecified: Secondary | ICD-10-CM | POA: Diagnosis not present

## 2017-03-02 DIAGNOSIS — M129 Arthropathy, unspecified: Secondary | ICD-10-CM | POA: Diagnosis not present

## 2017-03-02 DIAGNOSIS — I252 Old myocardial infarction: Secondary | ICD-10-CM | POA: Diagnosis not present

## 2017-03-02 DIAGNOSIS — E7800 Pure hypercholesterolemia, unspecified: Secondary | ICD-10-CM | POA: Diagnosis not present

## 2017-03-02 LAB — CBC
HCT: 37 % — ABNORMAL LOW (ref 40.0–52.0)
Hemoglobin: 12.8 g/dL — ABNORMAL LOW (ref 13.0–18.0)
MCH: 30.6 pg (ref 26.0–34.0)
MCHC: 34.5 g/dL (ref 32.0–36.0)
MCV: 88.6 fL (ref 80.0–100.0)
Platelets: 162 10*3/uL (ref 150–440)
RBC: 4.17 MIL/uL — ABNORMAL LOW (ref 4.40–5.90)
RDW: 13.7 % (ref 11.5–14.5)
WBC: 5.2 10*3/uL (ref 3.8–10.6)

## 2017-03-03 ENCOUNTER — Other Ambulatory Visit: Payer: Self-pay | Admitting: Radiation Oncology

## 2017-03-03 ENCOUNTER — Ambulatory Visit
Admission: RE | Admit: 2017-03-03 | Discharge: 2017-03-03 | Disposition: A | Discharge status: Home / Self Care | Payer: Medicare Other | Source: Ambulatory Visit | Attending: Radiation Oncology | Admitting: Radiation Oncology

## 2017-03-03 ENCOUNTER — Other Ambulatory Visit: Payer: Self-pay | Admitting: *Deleted

## 2017-03-03 DIAGNOSIS — R0789 Other chest pain: Secondary | ICD-10-CM

## 2017-03-03 DIAGNOSIS — W19XXXA Unspecified fall, initial encounter: Secondary | ICD-10-CM | POA: Insufficient documentation

## 2017-03-03 DIAGNOSIS — M549 Dorsalgia, unspecified: Secondary | ICD-10-CM | POA: Diagnosis not present

## 2017-03-03 DIAGNOSIS — Z7982 Long term (current) use of aspirin: Secondary | ICD-10-CM | POA: Diagnosis not present

## 2017-03-03 DIAGNOSIS — G8929 Other chronic pain: Secondary | ICD-10-CM | POA: Diagnosis not present

## 2017-03-03 DIAGNOSIS — I10 Essential (primary) hypertension: Secondary | ICD-10-CM | POA: Diagnosis not present

## 2017-03-03 DIAGNOSIS — R0781 Pleurodynia: Secondary | ICD-10-CM

## 2017-03-03 DIAGNOSIS — R011 Cardiac murmur, unspecified: Secondary | ICD-10-CM | POA: Diagnosis not present

## 2017-03-03 DIAGNOSIS — Z79899 Other long term (current) drug therapy: Secondary | ICD-10-CM | POA: Diagnosis not present

## 2017-03-03 DIAGNOSIS — S299XXA Unspecified injury of thorax, initial encounter: Secondary | ICD-10-CM | POA: Diagnosis not present

## 2017-03-03 DIAGNOSIS — K219 Gastro-esophageal reflux disease without esophagitis: Secondary | ICD-10-CM | POA: Diagnosis not present

## 2017-03-03 DIAGNOSIS — M129 Arthropathy, unspecified: Secondary | ICD-10-CM | POA: Diagnosis not present

## 2017-03-03 DIAGNOSIS — G629 Polyneuropathy, unspecified: Secondary | ICD-10-CM | POA: Diagnosis not present

## 2017-03-03 DIAGNOSIS — J42 Unspecified chronic bronchitis: Secondary | ICD-10-CM | POA: Diagnosis not present

## 2017-03-03 DIAGNOSIS — E7800 Pure hypercholesterolemia, unspecified: Secondary | ICD-10-CM | POA: Diagnosis not present

## 2017-03-03 DIAGNOSIS — I2510 Atherosclerotic heart disease of native coronary artery without angina pectoris: Secondary | ICD-10-CM | POA: Diagnosis not present

## 2017-03-03 DIAGNOSIS — K449 Diaphragmatic hernia without obstruction or gangrene: Secondary | ICD-10-CM | POA: Diagnosis not present

## 2017-03-03 DIAGNOSIS — G4730 Sleep apnea, unspecified: Secondary | ICD-10-CM | POA: Diagnosis not present

## 2017-03-03 DIAGNOSIS — S2231XA Fracture of one rib, right side, initial encounter for closed fracture: Secondary | ICD-10-CM | POA: Diagnosis not present

## 2017-03-03 DIAGNOSIS — C61 Malignant neoplasm of prostate: Secondary | ICD-10-CM | POA: Diagnosis not present

## 2017-03-03 DIAGNOSIS — S2241XA Multiple fractures of ribs, right side, initial encounter for closed fracture: Secondary | ICD-10-CM | POA: Diagnosis not present

## 2017-03-03 DIAGNOSIS — I252 Old myocardial infarction: Secondary | ICD-10-CM | POA: Diagnosis not present

## 2017-03-03 DIAGNOSIS — Z87891 Personal history of nicotine dependence: Secondary | ICD-10-CM | POA: Diagnosis not present

## 2017-03-03 DIAGNOSIS — Z510 Encounter for antineoplastic radiation therapy: Secondary | ICD-10-CM | POA: Diagnosis not present

## 2017-03-03 DIAGNOSIS — R079 Chest pain, unspecified: Secondary | ICD-10-CM | POA: Diagnosis not present

## 2017-03-06 ENCOUNTER — Ambulatory Visit
Admission: RE | Admit: 2017-03-06 | Discharge: 2017-03-06 | Disposition: A | Discharge status: Home / Self Care | Payer: Medicare Other | Source: Ambulatory Visit | Attending: Radiation Oncology | Admitting: Radiation Oncology

## 2017-03-06 DIAGNOSIS — Z87891 Personal history of nicotine dependence: Secondary | ICD-10-CM | POA: Diagnosis not present

## 2017-03-06 DIAGNOSIS — R011 Cardiac murmur, unspecified: Secondary | ICD-10-CM | POA: Diagnosis not present

## 2017-03-06 DIAGNOSIS — Z79899 Other long term (current) drug therapy: Secondary | ICD-10-CM | POA: Diagnosis not present

## 2017-03-06 DIAGNOSIS — I2510 Atherosclerotic heart disease of native coronary artery without angina pectoris: Secondary | ICD-10-CM | POA: Diagnosis not present

## 2017-03-06 DIAGNOSIS — Z7982 Long term (current) use of aspirin: Secondary | ICD-10-CM | POA: Diagnosis not present

## 2017-03-06 DIAGNOSIS — Z510 Encounter for antineoplastic radiation therapy: Secondary | ICD-10-CM | POA: Diagnosis not present

## 2017-03-06 DIAGNOSIS — K219 Gastro-esophageal reflux disease without esophagitis: Secondary | ICD-10-CM | POA: Diagnosis not present

## 2017-03-06 DIAGNOSIS — G4730 Sleep apnea, unspecified: Secondary | ICD-10-CM | POA: Diagnosis not present

## 2017-03-06 DIAGNOSIS — G629 Polyneuropathy, unspecified: Secondary | ICD-10-CM | POA: Diagnosis not present

## 2017-03-06 DIAGNOSIS — M129 Arthropathy, unspecified: Secondary | ICD-10-CM | POA: Diagnosis not present

## 2017-03-06 DIAGNOSIS — C61 Malignant neoplasm of prostate: Secondary | ICD-10-CM | POA: Diagnosis not present

## 2017-03-06 DIAGNOSIS — G8929 Other chronic pain: Secondary | ICD-10-CM | POA: Diagnosis not present

## 2017-03-06 DIAGNOSIS — K449 Diaphragmatic hernia without obstruction or gangrene: Secondary | ICD-10-CM | POA: Diagnosis not present

## 2017-03-06 DIAGNOSIS — J42 Unspecified chronic bronchitis: Secondary | ICD-10-CM | POA: Diagnosis not present

## 2017-03-06 DIAGNOSIS — E7800 Pure hypercholesterolemia, unspecified: Secondary | ICD-10-CM | POA: Diagnosis not present

## 2017-03-06 DIAGNOSIS — I252 Old myocardial infarction: Secondary | ICD-10-CM | POA: Diagnosis not present

## 2017-03-06 DIAGNOSIS — I10 Essential (primary) hypertension: Secondary | ICD-10-CM | POA: Diagnosis not present

## 2017-03-06 DIAGNOSIS — M549 Dorsalgia, unspecified: Secondary | ICD-10-CM | POA: Diagnosis not present

## 2017-03-07 ENCOUNTER — Ambulatory Visit
Admission: RE | Admit: 2017-03-07 | Discharge: 2017-03-07 | Disposition: A | Discharge status: Home / Self Care | Payer: Medicare Other | Source: Ambulatory Visit | Attending: Radiation Oncology | Admitting: Radiation Oncology

## 2017-03-07 DIAGNOSIS — Z7982 Long term (current) use of aspirin: Secondary | ICD-10-CM | POA: Diagnosis not present

## 2017-03-07 DIAGNOSIS — Z510 Encounter for antineoplastic radiation therapy: Secondary | ICD-10-CM | POA: Diagnosis not present

## 2017-03-07 DIAGNOSIS — K449 Diaphragmatic hernia without obstruction or gangrene: Secondary | ICD-10-CM | POA: Diagnosis not present

## 2017-03-07 DIAGNOSIS — R011 Cardiac murmur, unspecified: Secondary | ICD-10-CM | POA: Diagnosis not present

## 2017-03-07 DIAGNOSIS — M549 Dorsalgia, unspecified: Secondary | ICD-10-CM | POA: Diagnosis not present

## 2017-03-07 DIAGNOSIS — Z87891 Personal history of nicotine dependence: Secondary | ICD-10-CM | POA: Diagnosis not present

## 2017-03-07 DIAGNOSIS — C61 Malignant neoplasm of prostate: Secondary | ICD-10-CM | POA: Diagnosis not present

## 2017-03-07 DIAGNOSIS — G4730 Sleep apnea, unspecified: Secondary | ICD-10-CM | POA: Diagnosis not present

## 2017-03-07 DIAGNOSIS — M129 Arthropathy, unspecified: Secondary | ICD-10-CM | POA: Diagnosis not present

## 2017-03-07 DIAGNOSIS — I2510 Atherosclerotic heart disease of native coronary artery without angina pectoris: Secondary | ICD-10-CM | POA: Diagnosis not present

## 2017-03-07 DIAGNOSIS — I10 Essential (primary) hypertension: Secondary | ICD-10-CM | POA: Diagnosis not present

## 2017-03-07 DIAGNOSIS — Z79899 Other long term (current) drug therapy: Secondary | ICD-10-CM | POA: Diagnosis not present

## 2017-03-07 DIAGNOSIS — J42 Unspecified chronic bronchitis: Secondary | ICD-10-CM | POA: Diagnosis not present

## 2017-03-07 DIAGNOSIS — K219 Gastro-esophageal reflux disease without esophagitis: Secondary | ICD-10-CM | POA: Diagnosis not present

## 2017-03-07 DIAGNOSIS — G8929 Other chronic pain: Secondary | ICD-10-CM | POA: Diagnosis not present

## 2017-03-07 DIAGNOSIS — G629 Polyneuropathy, unspecified: Secondary | ICD-10-CM | POA: Diagnosis not present

## 2017-03-07 DIAGNOSIS — E7800 Pure hypercholesterolemia, unspecified: Secondary | ICD-10-CM | POA: Diagnosis not present

## 2017-03-07 DIAGNOSIS — I252 Old myocardial infarction: Secondary | ICD-10-CM | POA: Diagnosis not present

## 2017-03-08 ENCOUNTER — Ambulatory Visit
Admission: RE | Admit: 2017-03-08 | Discharge: 2017-03-08 | Disposition: A | Discharge status: Home / Self Care | Payer: Medicare Other | Source: Ambulatory Visit | Attending: Radiation Oncology | Admitting: Radiation Oncology

## 2017-03-08 DIAGNOSIS — G4730 Sleep apnea, unspecified: Secondary | ICD-10-CM | POA: Diagnosis not present

## 2017-03-08 DIAGNOSIS — C61 Malignant neoplasm of prostate: Secondary | ICD-10-CM | POA: Diagnosis not present

## 2017-03-08 DIAGNOSIS — M129 Arthropathy, unspecified: Secondary | ICD-10-CM | POA: Diagnosis not present

## 2017-03-08 DIAGNOSIS — R011 Cardiac murmur, unspecified: Secondary | ICD-10-CM | POA: Diagnosis not present

## 2017-03-08 DIAGNOSIS — Z87891 Personal history of nicotine dependence: Secondary | ICD-10-CM | POA: Diagnosis not present

## 2017-03-08 DIAGNOSIS — G629 Polyneuropathy, unspecified: Secondary | ICD-10-CM | POA: Diagnosis not present

## 2017-03-08 DIAGNOSIS — E7800 Pure hypercholesterolemia, unspecified: Secondary | ICD-10-CM | POA: Diagnosis not present

## 2017-03-08 DIAGNOSIS — Z510 Encounter for antineoplastic radiation therapy: Secondary | ICD-10-CM | POA: Diagnosis not present

## 2017-03-08 DIAGNOSIS — I2510 Atherosclerotic heart disease of native coronary artery without angina pectoris: Secondary | ICD-10-CM | POA: Diagnosis not present

## 2017-03-08 DIAGNOSIS — G8929 Other chronic pain: Secondary | ICD-10-CM | POA: Diagnosis not present

## 2017-03-08 DIAGNOSIS — K449 Diaphragmatic hernia without obstruction or gangrene: Secondary | ICD-10-CM | POA: Diagnosis not present

## 2017-03-08 DIAGNOSIS — I10 Essential (primary) hypertension: Secondary | ICD-10-CM | POA: Diagnosis not present

## 2017-03-08 DIAGNOSIS — M549 Dorsalgia, unspecified: Secondary | ICD-10-CM | POA: Diagnosis not present

## 2017-03-08 DIAGNOSIS — I252 Old myocardial infarction: Secondary | ICD-10-CM | POA: Diagnosis not present

## 2017-03-08 DIAGNOSIS — J42 Unspecified chronic bronchitis: Secondary | ICD-10-CM | POA: Diagnosis not present

## 2017-03-08 DIAGNOSIS — Z7982 Long term (current) use of aspirin: Secondary | ICD-10-CM | POA: Diagnosis not present

## 2017-03-08 DIAGNOSIS — K219 Gastro-esophageal reflux disease without esophagitis: Secondary | ICD-10-CM | POA: Diagnosis not present

## 2017-03-08 DIAGNOSIS — Z79899 Other long term (current) drug therapy: Secondary | ICD-10-CM | POA: Diagnosis not present

## 2017-03-09 ENCOUNTER — Ambulatory Visit
Admission: RE | Admit: 2017-03-09 | Discharge: 2017-03-09 | Disposition: A | Discharge status: Home / Self Care | Payer: Medicare Other | Source: Ambulatory Visit | Attending: Radiation Oncology | Admitting: Radiation Oncology

## 2017-03-09 DIAGNOSIS — K449 Diaphragmatic hernia without obstruction or gangrene: Secondary | ICD-10-CM | POA: Diagnosis not present

## 2017-03-09 DIAGNOSIS — Z87891 Personal history of nicotine dependence: Secondary | ICD-10-CM | POA: Diagnosis not present

## 2017-03-09 DIAGNOSIS — R011 Cardiac murmur, unspecified: Secondary | ICD-10-CM | POA: Diagnosis not present

## 2017-03-09 DIAGNOSIS — G8929 Other chronic pain: Secondary | ICD-10-CM | POA: Diagnosis not present

## 2017-03-09 DIAGNOSIS — Z79899 Other long term (current) drug therapy: Secondary | ICD-10-CM | POA: Diagnosis not present

## 2017-03-09 DIAGNOSIS — Z7982 Long term (current) use of aspirin: Secondary | ICD-10-CM | POA: Diagnosis not present

## 2017-03-09 DIAGNOSIS — C61 Malignant neoplasm of prostate: Secondary | ICD-10-CM | POA: Diagnosis not present

## 2017-03-09 DIAGNOSIS — I252 Old myocardial infarction: Secondary | ICD-10-CM | POA: Diagnosis not present

## 2017-03-09 DIAGNOSIS — G629 Polyneuropathy, unspecified: Secondary | ICD-10-CM | POA: Diagnosis not present

## 2017-03-09 DIAGNOSIS — M129 Arthropathy, unspecified: Secondary | ICD-10-CM | POA: Diagnosis not present

## 2017-03-09 DIAGNOSIS — I10 Essential (primary) hypertension: Secondary | ICD-10-CM | POA: Diagnosis not present

## 2017-03-09 DIAGNOSIS — I2510 Atherosclerotic heart disease of native coronary artery without angina pectoris: Secondary | ICD-10-CM | POA: Diagnosis not present

## 2017-03-09 DIAGNOSIS — J42 Unspecified chronic bronchitis: Secondary | ICD-10-CM | POA: Diagnosis not present

## 2017-03-09 DIAGNOSIS — Z510 Encounter for antineoplastic radiation therapy: Secondary | ICD-10-CM | POA: Diagnosis not present

## 2017-03-09 DIAGNOSIS — E7800 Pure hypercholesterolemia, unspecified: Secondary | ICD-10-CM | POA: Diagnosis not present

## 2017-03-09 DIAGNOSIS — K219 Gastro-esophageal reflux disease without esophagitis: Secondary | ICD-10-CM | POA: Diagnosis not present

## 2017-03-09 DIAGNOSIS — G4730 Sleep apnea, unspecified: Secondary | ICD-10-CM | POA: Diagnosis not present

## 2017-03-09 DIAGNOSIS — M549 Dorsalgia, unspecified: Secondary | ICD-10-CM | POA: Diagnosis not present

## 2017-03-10 ENCOUNTER — Ambulatory Visit
Admission: RE | Admit: 2017-03-10 | Discharge: 2017-03-10 | Disposition: A | Discharge status: Home / Self Care | Payer: Medicare Other | Source: Ambulatory Visit | Attending: Radiation Oncology | Admitting: Radiation Oncology

## 2017-03-10 DIAGNOSIS — K449 Diaphragmatic hernia without obstruction or gangrene: Secondary | ICD-10-CM | POA: Diagnosis not present

## 2017-03-10 DIAGNOSIS — Z510 Encounter for antineoplastic radiation therapy: Secondary | ICD-10-CM | POA: Diagnosis not present

## 2017-03-10 DIAGNOSIS — Z79899 Other long term (current) drug therapy: Secondary | ICD-10-CM | POA: Diagnosis not present

## 2017-03-10 DIAGNOSIS — I252 Old myocardial infarction: Secondary | ICD-10-CM | POA: Diagnosis not present

## 2017-03-10 DIAGNOSIS — I10 Essential (primary) hypertension: Secondary | ICD-10-CM | POA: Diagnosis not present

## 2017-03-10 DIAGNOSIS — R011 Cardiac murmur, unspecified: Secondary | ICD-10-CM | POA: Diagnosis not present

## 2017-03-10 DIAGNOSIS — K219 Gastro-esophageal reflux disease without esophagitis: Secondary | ICD-10-CM | POA: Diagnosis not present

## 2017-03-10 DIAGNOSIS — E7800 Pure hypercholesterolemia, unspecified: Secondary | ICD-10-CM | POA: Diagnosis not present

## 2017-03-10 DIAGNOSIS — G629 Polyneuropathy, unspecified: Secondary | ICD-10-CM | POA: Diagnosis not present

## 2017-03-10 DIAGNOSIS — C61 Malignant neoplasm of prostate: Secondary | ICD-10-CM | POA: Diagnosis not present

## 2017-03-10 DIAGNOSIS — I2510 Atherosclerotic heart disease of native coronary artery without angina pectoris: Secondary | ICD-10-CM | POA: Diagnosis not present

## 2017-03-10 DIAGNOSIS — Z87891 Personal history of nicotine dependence: Secondary | ICD-10-CM | POA: Diagnosis not present

## 2017-03-10 DIAGNOSIS — M129 Arthropathy, unspecified: Secondary | ICD-10-CM | POA: Diagnosis not present

## 2017-03-10 DIAGNOSIS — J42 Unspecified chronic bronchitis: Secondary | ICD-10-CM | POA: Diagnosis not present

## 2017-03-10 DIAGNOSIS — G4730 Sleep apnea, unspecified: Secondary | ICD-10-CM | POA: Diagnosis not present

## 2017-03-10 DIAGNOSIS — M549 Dorsalgia, unspecified: Secondary | ICD-10-CM | POA: Diagnosis not present

## 2017-03-10 DIAGNOSIS — Z7982 Long term (current) use of aspirin: Secondary | ICD-10-CM | POA: Diagnosis not present

## 2017-03-10 DIAGNOSIS — G8929 Other chronic pain: Secondary | ICD-10-CM | POA: Diagnosis not present

## 2017-03-13 ENCOUNTER — Ambulatory Visit
Admission: RE | Admit: 2017-03-13 | Discharge: 2017-03-13 | Disposition: A | Discharge status: Home / Self Care | Payer: Medicare Other | Source: Ambulatory Visit | Attending: Radiation Oncology | Admitting: Radiation Oncology

## 2017-03-13 DIAGNOSIS — Z7982 Long term (current) use of aspirin: Secondary | ICD-10-CM | POA: Diagnosis not present

## 2017-03-13 DIAGNOSIS — Z79899 Other long term (current) drug therapy: Secondary | ICD-10-CM | POA: Diagnosis not present

## 2017-03-13 DIAGNOSIS — C61 Malignant neoplasm of prostate: Secondary | ICD-10-CM | POA: Diagnosis not present

## 2017-03-13 DIAGNOSIS — G629 Polyneuropathy, unspecified: Secondary | ICD-10-CM | POA: Diagnosis not present

## 2017-03-13 DIAGNOSIS — G8929 Other chronic pain: Secondary | ICD-10-CM | POA: Diagnosis not present

## 2017-03-13 DIAGNOSIS — M129 Arthropathy, unspecified: Secondary | ICD-10-CM | POA: Diagnosis not present

## 2017-03-13 DIAGNOSIS — I252 Old myocardial infarction: Secondary | ICD-10-CM | POA: Diagnosis not present

## 2017-03-13 DIAGNOSIS — M549 Dorsalgia, unspecified: Secondary | ICD-10-CM | POA: Diagnosis not present

## 2017-03-13 DIAGNOSIS — E7800 Pure hypercholesterolemia, unspecified: Secondary | ICD-10-CM | POA: Diagnosis not present

## 2017-03-13 DIAGNOSIS — K449 Diaphragmatic hernia without obstruction or gangrene: Secondary | ICD-10-CM | POA: Diagnosis not present

## 2017-03-13 DIAGNOSIS — K219 Gastro-esophageal reflux disease without esophagitis: Secondary | ICD-10-CM | POA: Diagnosis not present

## 2017-03-13 DIAGNOSIS — G4730 Sleep apnea, unspecified: Secondary | ICD-10-CM | POA: Diagnosis not present

## 2017-03-13 DIAGNOSIS — R011 Cardiac murmur, unspecified: Secondary | ICD-10-CM | POA: Diagnosis not present

## 2017-03-13 DIAGNOSIS — Z510 Encounter for antineoplastic radiation therapy: Secondary | ICD-10-CM | POA: Diagnosis not present

## 2017-03-13 DIAGNOSIS — I10 Essential (primary) hypertension: Secondary | ICD-10-CM | POA: Diagnosis not present

## 2017-03-13 DIAGNOSIS — J42 Unspecified chronic bronchitis: Secondary | ICD-10-CM | POA: Diagnosis not present

## 2017-03-13 DIAGNOSIS — I2510 Atherosclerotic heart disease of native coronary artery without angina pectoris: Secondary | ICD-10-CM | POA: Diagnosis not present

## 2017-03-13 DIAGNOSIS — Z87891 Personal history of nicotine dependence: Secondary | ICD-10-CM | POA: Diagnosis not present

## 2017-03-14 ENCOUNTER — Ambulatory Visit
Admission: RE | Admit: 2017-03-14 | Discharge: 2017-03-14 | Disposition: A | Discharge status: Home / Self Care | Payer: Medicare Other | Source: Ambulatory Visit | Attending: Radiation Oncology | Admitting: Radiation Oncology

## 2017-03-14 DIAGNOSIS — G8929 Other chronic pain: Secondary | ICD-10-CM | POA: Diagnosis not present

## 2017-03-14 DIAGNOSIS — K219 Gastro-esophageal reflux disease without esophagitis: Secondary | ICD-10-CM | POA: Diagnosis not present

## 2017-03-14 DIAGNOSIS — G629 Polyneuropathy, unspecified: Secondary | ICD-10-CM | POA: Diagnosis not present

## 2017-03-14 DIAGNOSIS — R011 Cardiac murmur, unspecified: Secondary | ICD-10-CM | POA: Diagnosis not present

## 2017-03-14 DIAGNOSIS — Z7982 Long term (current) use of aspirin: Secondary | ICD-10-CM | POA: Diagnosis not present

## 2017-03-14 DIAGNOSIS — M129 Arthropathy, unspecified: Secondary | ICD-10-CM | POA: Diagnosis not present

## 2017-03-14 DIAGNOSIS — G4730 Sleep apnea, unspecified: Secondary | ICD-10-CM | POA: Diagnosis not present

## 2017-03-14 DIAGNOSIS — I10 Essential (primary) hypertension: Secondary | ICD-10-CM | POA: Diagnosis not present

## 2017-03-14 DIAGNOSIS — I252 Old myocardial infarction: Secondary | ICD-10-CM | POA: Diagnosis not present

## 2017-03-14 DIAGNOSIS — Z79899 Other long term (current) drug therapy: Secondary | ICD-10-CM | POA: Diagnosis not present

## 2017-03-14 DIAGNOSIS — J42 Unspecified chronic bronchitis: Secondary | ICD-10-CM | POA: Diagnosis not present

## 2017-03-14 DIAGNOSIS — Z87891 Personal history of nicotine dependence: Secondary | ICD-10-CM | POA: Diagnosis not present

## 2017-03-14 DIAGNOSIS — M549 Dorsalgia, unspecified: Secondary | ICD-10-CM | POA: Diagnosis not present

## 2017-03-14 DIAGNOSIS — C61 Malignant neoplasm of prostate: Secondary | ICD-10-CM | POA: Diagnosis not present

## 2017-03-14 DIAGNOSIS — I2510 Atherosclerotic heart disease of native coronary artery without angina pectoris: Secondary | ICD-10-CM | POA: Diagnosis not present

## 2017-03-14 DIAGNOSIS — Z510 Encounter for antineoplastic radiation therapy: Secondary | ICD-10-CM | POA: Diagnosis not present

## 2017-03-14 DIAGNOSIS — E7800 Pure hypercholesterolemia, unspecified: Secondary | ICD-10-CM | POA: Diagnosis not present

## 2017-03-14 DIAGNOSIS — K449 Diaphragmatic hernia without obstruction or gangrene: Secondary | ICD-10-CM | POA: Diagnosis not present

## 2017-03-15 ENCOUNTER — Ambulatory Visit
Admission: RE | Admit: 2017-03-15 | Discharge: 2017-03-15 | Disposition: A | Discharge status: Home / Self Care | Payer: Medicare Other | Source: Ambulatory Visit | Attending: Radiation Oncology | Admitting: Radiation Oncology

## 2017-03-15 DIAGNOSIS — I252 Old myocardial infarction: Secondary | ICD-10-CM | POA: Diagnosis not present

## 2017-03-15 DIAGNOSIS — Z510 Encounter for antineoplastic radiation therapy: Secondary | ICD-10-CM | POA: Diagnosis not present

## 2017-03-15 DIAGNOSIS — G8929 Other chronic pain: Secondary | ICD-10-CM | POA: Diagnosis not present

## 2017-03-15 DIAGNOSIS — Z79899 Other long term (current) drug therapy: Secondary | ICD-10-CM | POA: Diagnosis not present

## 2017-03-15 DIAGNOSIS — K219 Gastro-esophageal reflux disease without esophagitis: Secondary | ICD-10-CM | POA: Diagnosis not present

## 2017-03-15 DIAGNOSIS — J42 Unspecified chronic bronchitis: Secondary | ICD-10-CM | POA: Diagnosis not present

## 2017-03-15 DIAGNOSIS — Z7982 Long term (current) use of aspirin: Secondary | ICD-10-CM | POA: Diagnosis not present

## 2017-03-15 DIAGNOSIS — Z87891 Personal history of nicotine dependence: Secondary | ICD-10-CM | POA: Diagnosis not present

## 2017-03-15 DIAGNOSIS — I2510 Atherosclerotic heart disease of native coronary artery without angina pectoris: Secondary | ICD-10-CM | POA: Diagnosis not present

## 2017-03-15 DIAGNOSIS — G4730 Sleep apnea, unspecified: Secondary | ICD-10-CM | POA: Diagnosis not present

## 2017-03-15 DIAGNOSIS — C61 Malignant neoplasm of prostate: Secondary | ICD-10-CM | POA: Diagnosis not present

## 2017-03-15 DIAGNOSIS — E7800 Pure hypercholesterolemia, unspecified: Secondary | ICD-10-CM | POA: Diagnosis not present

## 2017-03-15 DIAGNOSIS — K449 Diaphragmatic hernia without obstruction or gangrene: Secondary | ICD-10-CM | POA: Diagnosis not present

## 2017-03-15 DIAGNOSIS — M549 Dorsalgia, unspecified: Secondary | ICD-10-CM | POA: Diagnosis not present

## 2017-03-15 DIAGNOSIS — M129 Arthropathy, unspecified: Secondary | ICD-10-CM | POA: Diagnosis not present

## 2017-03-15 DIAGNOSIS — I10 Essential (primary) hypertension: Secondary | ICD-10-CM | POA: Diagnosis not present

## 2017-03-15 DIAGNOSIS — R011 Cardiac murmur, unspecified: Secondary | ICD-10-CM | POA: Diagnosis not present

## 2017-03-15 DIAGNOSIS — G629 Polyneuropathy, unspecified: Secondary | ICD-10-CM | POA: Diagnosis not present

## 2017-03-16 ENCOUNTER — Inpatient Hospital Stay: Payer: Medicare Other

## 2017-03-16 ENCOUNTER — Ambulatory Visit
Admission: RE | Admit: 2017-03-16 | Discharge: 2017-03-16 | Disposition: A | Discharge status: Home / Self Care | Payer: Medicare Other | Source: Ambulatory Visit | Attending: Radiation Oncology | Admitting: Radiation Oncology

## 2017-03-16 DIAGNOSIS — C61 Malignant neoplasm of prostate: Secondary | ICD-10-CM

## 2017-03-16 DIAGNOSIS — Z79899 Other long term (current) drug therapy: Secondary | ICD-10-CM | POA: Diagnosis not present

## 2017-03-16 DIAGNOSIS — Z510 Encounter for antineoplastic radiation therapy: Secondary | ICD-10-CM | POA: Diagnosis not present

## 2017-03-16 DIAGNOSIS — E7800 Pure hypercholesterolemia, unspecified: Secondary | ICD-10-CM | POA: Diagnosis not present

## 2017-03-16 DIAGNOSIS — I252 Old myocardial infarction: Secondary | ICD-10-CM | POA: Diagnosis not present

## 2017-03-16 DIAGNOSIS — K449 Diaphragmatic hernia without obstruction or gangrene: Secondary | ICD-10-CM | POA: Diagnosis not present

## 2017-03-16 DIAGNOSIS — J42 Unspecified chronic bronchitis: Secondary | ICD-10-CM | POA: Diagnosis not present

## 2017-03-16 DIAGNOSIS — Z7982 Long term (current) use of aspirin: Secondary | ICD-10-CM | POA: Diagnosis not present

## 2017-03-16 DIAGNOSIS — M129 Arthropathy, unspecified: Secondary | ICD-10-CM | POA: Diagnosis not present

## 2017-03-16 DIAGNOSIS — G629 Polyneuropathy, unspecified: Secondary | ICD-10-CM | POA: Diagnosis not present

## 2017-03-16 DIAGNOSIS — Z87891 Personal history of nicotine dependence: Secondary | ICD-10-CM | POA: Diagnosis not present

## 2017-03-16 DIAGNOSIS — K219 Gastro-esophageal reflux disease without esophagitis: Secondary | ICD-10-CM | POA: Diagnosis not present

## 2017-03-16 DIAGNOSIS — G8929 Other chronic pain: Secondary | ICD-10-CM | POA: Diagnosis not present

## 2017-03-16 DIAGNOSIS — M549 Dorsalgia, unspecified: Secondary | ICD-10-CM | POA: Diagnosis not present

## 2017-03-16 DIAGNOSIS — I2510 Atherosclerotic heart disease of native coronary artery without angina pectoris: Secondary | ICD-10-CM | POA: Diagnosis not present

## 2017-03-16 DIAGNOSIS — R011 Cardiac murmur, unspecified: Secondary | ICD-10-CM | POA: Diagnosis not present

## 2017-03-16 DIAGNOSIS — I10 Essential (primary) hypertension: Secondary | ICD-10-CM | POA: Diagnosis not present

## 2017-03-16 DIAGNOSIS — G4730 Sleep apnea, unspecified: Secondary | ICD-10-CM | POA: Diagnosis not present

## 2017-03-16 LAB — CBC
HCT: 33.6 % — ABNORMAL LOW (ref 40.0–52.0)
Hemoglobin: 11.7 g/dL — ABNORMAL LOW (ref 13.0–18.0)
MCH: 30.9 pg (ref 26.0–34.0)
MCHC: 34.8 g/dL (ref 32.0–36.0)
MCV: 89 fL (ref 80.0–100.0)
Platelets: 170 10*3/uL (ref 150–440)
RBC: 3.78 MIL/uL — ABNORMAL LOW (ref 4.40–5.90)
RDW: 14.2 % (ref 11.5–14.5)
WBC: 5.1 10*3/uL (ref 3.8–10.6)

## 2017-03-17 ENCOUNTER — Ambulatory Visit
Admission: RE | Admit: 2017-03-17 | Discharge: 2017-03-17 | Disposition: A | Discharge status: Home / Self Care | Payer: Medicare Other | Source: Ambulatory Visit | Attending: Radiation Oncology | Admitting: Radiation Oncology

## 2017-03-17 DIAGNOSIS — G629 Polyneuropathy, unspecified: Secondary | ICD-10-CM | POA: Diagnosis not present

## 2017-03-17 DIAGNOSIS — C61 Malignant neoplasm of prostate: Secondary | ICD-10-CM | POA: Diagnosis not present

## 2017-03-17 DIAGNOSIS — K219 Gastro-esophageal reflux disease without esophagitis: Secondary | ICD-10-CM | POA: Diagnosis not present

## 2017-03-17 DIAGNOSIS — M129 Arthropathy, unspecified: Secondary | ICD-10-CM | POA: Diagnosis not present

## 2017-03-17 DIAGNOSIS — M549 Dorsalgia, unspecified: Secondary | ICD-10-CM | POA: Diagnosis not present

## 2017-03-17 DIAGNOSIS — J42 Unspecified chronic bronchitis: Secondary | ICD-10-CM | POA: Diagnosis not present

## 2017-03-17 DIAGNOSIS — Z7982 Long term (current) use of aspirin: Secondary | ICD-10-CM | POA: Diagnosis not present

## 2017-03-17 DIAGNOSIS — G4730 Sleep apnea, unspecified: Secondary | ICD-10-CM | POA: Diagnosis not present

## 2017-03-17 DIAGNOSIS — Z87891 Personal history of nicotine dependence: Secondary | ICD-10-CM | POA: Diagnosis not present

## 2017-03-17 DIAGNOSIS — I10 Essential (primary) hypertension: Secondary | ICD-10-CM | POA: Diagnosis not present

## 2017-03-17 DIAGNOSIS — E7800 Pure hypercholesterolemia, unspecified: Secondary | ICD-10-CM | POA: Diagnosis not present

## 2017-03-17 DIAGNOSIS — I252 Old myocardial infarction: Secondary | ICD-10-CM | POA: Diagnosis not present

## 2017-03-17 DIAGNOSIS — I2510 Atherosclerotic heart disease of native coronary artery without angina pectoris: Secondary | ICD-10-CM | POA: Diagnosis not present

## 2017-03-17 DIAGNOSIS — K449 Diaphragmatic hernia without obstruction or gangrene: Secondary | ICD-10-CM | POA: Diagnosis not present

## 2017-03-17 DIAGNOSIS — Z510 Encounter for antineoplastic radiation therapy: Secondary | ICD-10-CM | POA: Diagnosis not present

## 2017-03-17 DIAGNOSIS — R011 Cardiac murmur, unspecified: Secondary | ICD-10-CM | POA: Diagnosis not present

## 2017-03-17 DIAGNOSIS — Z79899 Other long term (current) drug therapy: Secondary | ICD-10-CM | POA: Diagnosis not present

## 2017-03-17 DIAGNOSIS — G8929 Other chronic pain: Secondary | ICD-10-CM | POA: Diagnosis not present

## 2017-03-20 ENCOUNTER — Ambulatory Visit: Payer: Medicare Other

## 2017-03-20 DIAGNOSIS — S6992XA Unspecified injury of left wrist, hand and finger(s), initial encounter: Secondary | ICD-10-CM | POA: Diagnosis not present

## 2017-03-20 DIAGNOSIS — S52502A Unspecified fracture of the lower end of left radius, initial encounter for closed fracture: Secondary | ICD-10-CM | POA: Diagnosis not present

## 2017-03-21 ENCOUNTER — Ambulatory Visit
Admission: RE | Admit: 2017-03-21 | Discharge: 2017-03-21 | Disposition: A | Discharge status: Home / Self Care | Payer: Medicare Other | Source: Ambulatory Visit | Attending: Radiation Oncology | Admitting: Radiation Oncology

## 2017-03-21 DIAGNOSIS — E7800 Pure hypercholesterolemia, unspecified: Secondary | ICD-10-CM | POA: Diagnosis not present

## 2017-03-21 DIAGNOSIS — I10 Essential (primary) hypertension: Secondary | ICD-10-CM | POA: Diagnosis not present

## 2017-03-21 DIAGNOSIS — Z79899 Other long term (current) drug therapy: Secondary | ICD-10-CM | POA: Diagnosis not present

## 2017-03-21 DIAGNOSIS — Z510 Encounter for antineoplastic radiation therapy: Secondary | ICD-10-CM | POA: Diagnosis not present

## 2017-03-21 DIAGNOSIS — J42 Unspecified chronic bronchitis: Secondary | ICD-10-CM | POA: Diagnosis not present

## 2017-03-21 DIAGNOSIS — M549 Dorsalgia, unspecified: Secondary | ICD-10-CM | POA: Diagnosis not present

## 2017-03-21 DIAGNOSIS — C61 Malignant neoplasm of prostate: Secondary | ICD-10-CM | POA: Diagnosis not present

## 2017-03-21 DIAGNOSIS — G4730 Sleep apnea, unspecified: Secondary | ICD-10-CM | POA: Diagnosis not present

## 2017-03-21 DIAGNOSIS — M129 Arthropathy, unspecified: Secondary | ICD-10-CM | POA: Diagnosis not present

## 2017-03-21 DIAGNOSIS — I252 Old myocardial infarction: Secondary | ICD-10-CM | POA: Diagnosis not present

## 2017-03-21 DIAGNOSIS — I2510 Atherosclerotic heart disease of native coronary artery without angina pectoris: Secondary | ICD-10-CM | POA: Diagnosis not present

## 2017-03-21 DIAGNOSIS — R011 Cardiac murmur, unspecified: Secondary | ICD-10-CM | POA: Diagnosis not present

## 2017-03-21 DIAGNOSIS — K219 Gastro-esophageal reflux disease without esophagitis: Secondary | ICD-10-CM | POA: Diagnosis not present

## 2017-03-21 DIAGNOSIS — G629 Polyneuropathy, unspecified: Secondary | ICD-10-CM | POA: Diagnosis not present

## 2017-03-21 DIAGNOSIS — G8929 Other chronic pain: Secondary | ICD-10-CM | POA: Diagnosis not present

## 2017-03-21 DIAGNOSIS — Z87891 Personal history of nicotine dependence: Secondary | ICD-10-CM | POA: Diagnosis not present

## 2017-03-21 DIAGNOSIS — K449 Diaphragmatic hernia without obstruction or gangrene: Secondary | ICD-10-CM | POA: Diagnosis not present

## 2017-03-21 DIAGNOSIS — Z7982 Long term (current) use of aspirin: Secondary | ICD-10-CM | POA: Diagnosis not present

## 2017-03-22 ENCOUNTER — Ambulatory Visit
Admission: RE | Admit: 2017-03-22 | Discharge: 2017-03-22 | Disposition: A | Discharge status: Home / Self Care | Payer: Medicare Other | Source: Ambulatory Visit | Attending: Radiation Oncology | Admitting: Radiation Oncology

## 2017-03-22 DIAGNOSIS — G4730 Sleep apnea, unspecified: Secondary | ICD-10-CM | POA: Diagnosis not present

## 2017-03-22 DIAGNOSIS — Z87891 Personal history of nicotine dependence: Secondary | ICD-10-CM | POA: Diagnosis not present

## 2017-03-22 DIAGNOSIS — J42 Unspecified chronic bronchitis: Secondary | ICD-10-CM | POA: Diagnosis not present

## 2017-03-22 DIAGNOSIS — G629 Polyneuropathy, unspecified: Secondary | ICD-10-CM | POA: Diagnosis not present

## 2017-03-22 DIAGNOSIS — I252 Old myocardial infarction: Secondary | ICD-10-CM | POA: Diagnosis not present

## 2017-03-22 DIAGNOSIS — E7800 Pure hypercholesterolemia, unspecified: Secondary | ICD-10-CM | POA: Diagnosis not present

## 2017-03-22 DIAGNOSIS — M129 Arthropathy, unspecified: Secondary | ICD-10-CM | POA: Diagnosis not present

## 2017-03-22 DIAGNOSIS — K219 Gastro-esophageal reflux disease without esophagitis: Secondary | ICD-10-CM | POA: Diagnosis not present

## 2017-03-22 DIAGNOSIS — R011 Cardiac murmur, unspecified: Secondary | ICD-10-CM | POA: Diagnosis not present

## 2017-03-22 DIAGNOSIS — Z7982 Long term (current) use of aspirin: Secondary | ICD-10-CM | POA: Diagnosis not present

## 2017-03-22 DIAGNOSIS — K449 Diaphragmatic hernia without obstruction or gangrene: Secondary | ICD-10-CM | POA: Diagnosis not present

## 2017-03-22 DIAGNOSIS — C61 Malignant neoplasm of prostate: Secondary | ICD-10-CM | POA: Diagnosis not present

## 2017-03-22 DIAGNOSIS — I2510 Atherosclerotic heart disease of native coronary artery without angina pectoris: Secondary | ICD-10-CM | POA: Diagnosis not present

## 2017-03-22 DIAGNOSIS — G8929 Other chronic pain: Secondary | ICD-10-CM | POA: Diagnosis not present

## 2017-03-22 DIAGNOSIS — Z79899 Other long term (current) drug therapy: Secondary | ICD-10-CM | POA: Diagnosis not present

## 2017-03-22 DIAGNOSIS — Z510 Encounter for antineoplastic radiation therapy: Secondary | ICD-10-CM | POA: Diagnosis not present

## 2017-03-22 DIAGNOSIS — I10 Essential (primary) hypertension: Secondary | ICD-10-CM | POA: Diagnosis not present

## 2017-03-22 DIAGNOSIS — M549 Dorsalgia, unspecified: Secondary | ICD-10-CM | POA: Diagnosis not present

## 2017-03-23 ENCOUNTER — Ambulatory Visit
Admission: RE | Admit: 2017-03-23 | Discharge: 2017-03-23 | Disposition: A | Discharge status: Home / Self Care | Payer: Medicare Other | Source: Ambulatory Visit | Attending: Radiation Oncology | Admitting: Radiation Oncology

## 2017-03-23 DIAGNOSIS — K449 Diaphragmatic hernia without obstruction or gangrene: Secondary | ICD-10-CM | POA: Diagnosis not present

## 2017-03-23 DIAGNOSIS — C61 Malignant neoplasm of prostate: Secondary | ICD-10-CM | POA: Diagnosis not present

## 2017-03-23 DIAGNOSIS — M129 Arthropathy, unspecified: Secondary | ICD-10-CM | POA: Diagnosis not present

## 2017-03-23 DIAGNOSIS — G4730 Sleep apnea, unspecified: Secondary | ICD-10-CM | POA: Diagnosis not present

## 2017-03-23 DIAGNOSIS — M549 Dorsalgia, unspecified: Secondary | ICD-10-CM | POA: Diagnosis not present

## 2017-03-23 DIAGNOSIS — G629 Polyneuropathy, unspecified: Secondary | ICD-10-CM | POA: Diagnosis not present

## 2017-03-23 DIAGNOSIS — I2510 Atherosclerotic heart disease of native coronary artery without angina pectoris: Secondary | ICD-10-CM | POA: Diagnosis not present

## 2017-03-23 DIAGNOSIS — I252 Old myocardial infarction: Secondary | ICD-10-CM | POA: Diagnosis not present

## 2017-03-23 DIAGNOSIS — G8929 Other chronic pain: Secondary | ICD-10-CM | POA: Diagnosis not present

## 2017-03-23 DIAGNOSIS — K219 Gastro-esophageal reflux disease without esophagitis: Secondary | ICD-10-CM | POA: Diagnosis not present

## 2017-03-23 DIAGNOSIS — I10 Essential (primary) hypertension: Secondary | ICD-10-CM | POA: Diagnosis not present

## 2017-03-23 DIAGNOSIS — Z7982 Long term (current) use of aspirin: Secondary | ICD-10-CM | POA: Diagnosis not present

## 2017-03-23 DIAGNOSIS — E7800 Pure hypercholesterolemia, unspecified: Secondary | ICD-10-CM | POA: Diagnosis not present

## 2017-03-23 DIAGNOSIS — R011 Cardiac murmur, unspecified: Secondary | ICD-10-CM | POA: Diagnosis not present

## 2017-03-23 DIAGNOSIS — Z79899 Other long term (current) drug therapy: Secondary | ICD-10-CM | POA: Diagnosis not present

## 2017-03-23 DIAGNOSIS — Z510 Encounter for antineoplastic radiation therapy: Secondary | ICD-10-CM | POA: Diagnosis not present

## 2017-03-23 DIAGNOSIS — J42 Unspecified chronic bronchitis: Secondary | ICD-10-CM | POA: Diagnosis not present

## 2017-03-23 DIAGNOSIS — Z87891 Personal history of nicotine dependence: Secondary | ICD-10-CM | POA: Diagnosis not present

## 2017-03-24 ENCOUNTER — Ambulatory Visit
Admission: RE | Admit: 2017-03-24 | Discharge: 2017-03-24 | Disposition: A | Discharge status: Home / Self Care | Payer: Medicare Other | Source: Ambulatory Visit | Attending: Radiation Oncology | Admitting: Radiation Oncology

## 2017-03-24 DIAGNOSIS — G629 Polyneuropathy, unspecified: Secondary | ICD-10-CM | POA: Diagnosis not present

## 2017-03-24 DIAGNOSIS — R011 Cardiac murmur, unspecified: Secondary | ICD-10-CM | POA: Diagnosis not present

## 2017-03-24 DIAGNOSIS — G4730 Sleep apnea, unspecified: Secondary | ICD-10-CM | POA: Diagnosis not present

## 2017-03-24 DIAGNOSIS — Z87891 Personal history of nicotine dependence: Secondary | ICD-10-CM | POA: Diagnosis not present

## 2017-03-24 DIAGNOSIS — I10 Essential (primary) hypertension: Secondary | ICD-10-CM | POA: Diagnosis not present

## 2017-03-24 DIAGNOSIS — I2510 Atherosclerotic heart disease of native coronary artery without angina pectoris: Secondary | ICD-10-CM | POA: Diagnosis not present

## 2017-03-24 DIAGNOSIS — M129 Arthropathy, unspecified: Secondary | ICD-10-CM | POA: Diagnosis not present

## 2017-03-24 DIAGNOSIS — K449 Diaphragmatic hernia without obstruction or gangrene: Secondary | ICD-10-CM | POA: Diagnosis not present

## 2017-03-24 DIAGNOSIS — Z7982 Long term (current) use of aspirin: Secondary | ICD-10-CM | POA: Diagnosis not present

## 2017-03-24 DIAGNOSIS — K219 Gastro-esophageal reflux disease without esophagitis: Secondary | ICD-10-CM | POA: Diagnosis not present

## 2017-03-24 DIAGNOSIS — M549 Dorsalgia, unspecified: Secondary | ICD-10-CM | POA: Diagnosis not present

## 2017-03-24 DIAGNOSIS — I252 Old myocardial infarction: Secondary | ICD-10-CM | POA: Diagnosis not present

## 2017-03-24 DIAGNOSIS — Z510 Encounter for antineoplastic radiation therapy: Secondary | ICD-10-CM | POA: Diagnosis not present

## 2017-03-24 DIAGNOSIS — E7800 Pure hypercholesterolemia, unspecified: Secondary | ICD-10-CM | POA: Diagnosis not present

## 2017-03-24 DIAGNOSIS — Z79899 Other long term (current) drug therapy: Secondary | ICD-10-CM | POA: Diagnosis not present

## 2017-03-24 DIAGNOSIS — G8929 Other chronic pain: Secondary | ICD-10-CM | POA: Diagnosis not present

## 2017-03-24 DIAGNOSIS — C61 Malignant neoplasm of prostate: Secondary | ICD-10-CM | POA: Diagnosis not present

## 2017-03-24 DIAGNOSIS — J42 Unspecified chronic bronchitis: Secondary | ICD-10-CM | POA: Diagnosis not present

## 2017-03-27 ENCOUNTER — Ambulatory Visit
Admission: RE | Admit: 2017-03-27 | Discharge: 2017-03-27 | Disposition: A | Discharge status: Home / Self Care | Payer: Medicare Other | Source: Ambulatory Visit | Attending: Radiation Oncology | Admitting: Radiation Oncology

## 2017-03-27 DIAGNOSIS — M129 Arthropathy, unspecified: Secondary | ICD-10-CM | POA: Diagnosis not present

## 2017-03-27 DIAGNOSIS — Z79899 Other long term (current) drug therapy: Secondary | ICD-10-CM | POA: Diagnosis not present

## 2017-03-27 DIAGNOSIS — Z7982 Long term (current) use of aspirin: Secondary | ICD-10-CM | POA: Diagnosis not present

## 2017-03-27 DIAGNOSIS — K219 Gastro-esophageal reflux disease without esophagitis: Secondary | ICD-10-CM | POA: Diagnosis not present

## 2017-03-27 DIAGNOSIS — G629 Polyneuropathy, unspecified: Secondary | ICD-10-CM | POA: Diagnosis not present

## 2017-03-27 DIAGNOSIS — I10 Essential (primary) hypertension: Secondary | ICD-10-CM | POA: Diagnosis not present

## 2017-03-27 DIAGNOSIS — Z87891 Personal history of nicotine dependence: Secondary | ICD-10-CM | POA: Diagnosis not present

## 2017-03-27 DIAGNOSIS — G8929 Other chronic pain: Secondary | ICD-10-CM | POA: Diagnosis not present

## 2017-03-27 DIAGNOSIS — I2510 Atherosclerotic heart disease of native coronary artery without angina pectoris: Secondary | ICD-10-CM | POA: Diagnosis not present

## 2017-03-27 DIAGNOSIS — R011 Cardiac murmur, unspecified: Secondary | ICD-10-CM | POA: Diagnosis not present

## 2017-03-27 DIAGNOSIS — E7800 Pure hypercholesterolemia, unspecified: Secondary | ICD-10-CM | POA: Diagnosis not present

## 2017-03-27 DIAGNOSIS — C61 Malignant neoplasm of prostate: Secondary | ICD-10-CM | POA: Diagnosis not present

## 2017-03-27 DIAGNOSIS — M549 Dorsalgia, unspecified: Secondary | ICD-10-CM | POA: Diagnosis not present

## 2017-03-27 DIAGNOSIS — Z510 Encounter for antineoplastic radiation therapy: Secondary | ICD-10-CM | POA: Diagnosis not present

## 2017-03-27 DIAGNOSIS — G4730 Sleep apnea, unspecified: Secondary | ICD-10-CM | POA: Diagnosis not present

## 2017-03-27 DIAGNOSIS — J42 Unspecified chronic bronchitis: Secondary | ICD-10-CM | POA: Diagnosis not present

## 2017-03-27 DIAGNOSIS — K449 Diaphragmatic hernia without obstruction or gangrene: Secondary | ICD-10-CM | POA: Diagnosis not present

## 2017-03-27 DIAGNOSIS — I252 Old myocardial infarction: Secondary | ICD-10-CM | POA: Diagnosis not present

## 2017-03-28 ENCOUNTER — Ambulatory Visit
Admission: RE | Admit: 2017-03-28 | Discharge: 2017-03-28 | Disposition: A | Discharge status: Home / Self Care | Payer: Medicare Other | Source: Ambulatory Visit | Attending: Radiation Oncology | Admitting: Radiation Oncology

## 2017-03-28 DIAGNOSIS — I10 Essential (primary) hypertension: Secondary | ICD-10-CM | POA: Diagnosis not present

## 2017-03-28 DIAGNOSIS — M129 Arthropathy, unspecified: Secondary | ICD-10-CM | POA: Diagnosis not present

## 2017-03-28 DIAGNOSIS — K449 Diaphragmatic hernia without obstruction or gangrene: Secondary | ICD-10-CM | POA: Diagnosis not present

## 2017-03-28 DIAGNOSIS — J42 Unspecified chronic bronchitis: Secondary | ICD-10-CM | POA: Diagnosis not present

## 2017-03-28 DIAGNOSIS — G8929 Other chronic pain: Secondary | ICD-10-CM | POA: Diagnosis not present

## 2017-03-28 DIAGNOSIS — C61 Malignant neoplasm of prostate: Secondary | ICD-10-CM | POA: Diagnosis not present

## 2017-03-28 DIAGNOSIS — R011 Cardiac murmur, unspecified: Secondary | ICD-10-CM | POA: Diagnosis not present

## 2017-03-28 DIAGNOSIS — G629 Polyneuropathy, unspecified: Secondary | ICD-10-CM | POA: Diagnosis not present

## 2017-03-28 DIAGNOSIS — G4730 Sleep apnea, unspecified: Secondary | ICD-10-CM | POA: Diagnosis not present

## 2017-03-28 DIAGNOSIS — Z510 Encounter for antineoplastic radiation therapy: Secondary | ICD-10-CM | POA: Diagnosis not present

## 2017-03-28 DIAGNOSIS — I252 Old myocardial infarction: Secondary | ICD-10-CM | POA: Diagnosis not present

## 2017-03-28 DIAGNOSIS — I2510 Atherosclerotic heart disease of native coronary artery without angina pectoris: Secondary | ICD-10-CM | POA: Diagnosis not present

## 2017-03-28 DIAGNOSIS — Z7982 Long term (current) use of aspirin: Secondary | ICD-10-CM | POA: Diagnosis not present

## 2017-03-28 DIAGNOSIS — Z79899 Other long term (current) drug therapy: Secondary | ICD-10-CM | POA: Diagnosis not present

## 2017-03-28 DIAGNOSIS — Z87891 Personal history of nicotine dependence: Secondary | ICD-10-CM | POA: Diagnosis not present

## 2017-03-28 DIAGNOSIS — M549 Dorsalgia, unspecified: Secondary | ICD-10-CM | POA: Diagnosis not present

## 2017-03-28 DIAGNOSIS — K219 Gastro-esophageal reflux disease without esophagitis: Secondary | ICD-10-CM | POA: Diagnosis not present

## 2017-03-28 DIAGNOSIS — E7800 Pure hypercholesterolemia, unspecified: Secondary | ICD-10-CM | POA: Diagnosis not present

## 2017-03-29 ENCOUNTER — Ambulatory Visit: Payer: Medicare Other

## 2017-03-29 ENCOUNTER — Ambulatory Visit
Admission: RE | Admit: 2017-03-29 | Discharge: 2017-03-29 | Disposition: A | Discharge status: Home / Self Care | Payer: Medicare Other | Source: Ambulatory Visit | Attending: Radiation Oncology | Admitting: Radiation Oncology

## 2017-03-29 DIAGNOSIS — M549 Dorsalgia, unspecified: Secondary | ICD-10-CM | POA: Diagnosis not present

## 2017-03-29 DIAGNOSIS — K449 Diaphragmatic hernia without obstruction or gangrene: Secondary | ICD-10-CM | POA: Diagnosis not present

## 2017-03-29 DIAGNOSIS — Z7982 Long term (current) use of aspirin: Secondary | ICD-10-CM | POA: Diagnosis not present

## 2017-03-29 DIAGNOSIS — Z510 Encounter for antineoplastic radiation therapy: Secondary | ICD-10-CM | POA: Diagnosis not present

## 2017-03-29 DIAGNOSIS — G629 Polyneuropathy, unspecified: Secondary | ICD-10-CM | POA: Diagnosis not present

## 2017-03-29 DIAGNOSIS — E7800 Pure hypercholesterolemia, unspecified: Secondary | ICD-10-CM | POA: Diagnosis not present

## 2017-03-29 DIAGNOSIS — R011 Cardiac murmur, unspecified: Secondary | ICD-10-CM | POA: Diagnosis not present

## 2017-03-29 DIAGNOSIS — I10 Essential (primary) hypertension: Secondary | ICD-10-CM | POA: Diagnosis not present

## 2017-03-29 DIAGNOSIS — G8929 Other chronic pain: Secondary | ICD-10-CM | POA: Diagnosis not present

## 2017-03-29 DIAGNOSIS — J42 Unspecified chronic bronchitis: Secondary | ICD-10-CM | POA: Diagnosis not present

## 2017-03-29 DIAGNOSIS — C61 Malignant neoplasm of prostate: Secondary | ICD-10-CM | POA: Diagnosis not present

## 2017-03-29 DIAGNOSIS — Z79899 Other long term (current) drug therapy: Secondary | ICD-10-CM | POA: Diagnosis not present

## 2017-03-29 DIAGNOSIS — K219 Gastro-esophageal reflux disease without esophagitis: Secondary | ICD-10-CM | POA: Diagnosis not present

## 2017-03-29 DIAGNOSIS — G4730 Sleep apnea, unspecified: Secondary | ICD-10-CM | POA: Diagnosis not present

## 2017-03-29 DIAGNOSIS — I2510 Atherosclerotic heart disease of native coronary artery without angina pectoris: Secondary | ICD-10-CM | POA: Diagnosis not present

## 2017-03-29 DIAGNOSIS — M129 Arthropathy, unspecified: Secondary | ICD-10-CM | POA: Diagnosis not present

## 2017-03-29 DIAGNOSIS — I252 Old myocardial infarction: Secondary | ICD-10-CM | POA: Diagnosis not present

## 2017-03-29 DIAGNOSIS — Z87891 Personal history of nicotine dependence: Secondary | ICD-10-CM | POA: Diagnosis not present

## 2017-03-30 ENCOUNTER — Other Ambulatory Visit: Payer: Self-pay | Admitting: Internal Medicine

## 2017-03-30 ENCOUNTER — Ambulatory Visit
Admission: RE | Admit: 2017-03-30 | Discharge: 2017-03-30 | Disposition: A | Discharge status: Home / Self Care | Payer: Medicare Other | Source: Ambulatory Visit | Attending: Radiation Oncology | Admitting: Radiation Oncology

## 2017-03-30 DIAGNOSIS — I2510 Atherosclerotic heart disease of native coronary artery without angina pectoris: Secondary | ICD-10-CM | POA: Diagnosis not present

## 2017-03-30 DIAGNOSIS — E7800 Pure hypercholesterolemia, unspecified: Secondary | ICD-10-CM | POA: Diagnosis not present

## 2017-03-30 DIAGNOSIS — C61 Malignant neoplasm of prostate: Secondary | ICD-10-CM | POA: Diagnosis not present

## 2017-03-30 DIAGNOSIS — M549 Dorsalgia, unspecified: Secondary | ICD-10-CM | POA: Diagnosis not present

## 2017-03-30 DIAGNOSIS — J42 Unspecified chronic bronchitis: Secondary | ICD-10-CM | POA: Diagnosis not present

## 2017-03-30 DIAGNOSIS — Z87891 Personal history of nicotine dependence: Secondary | ICD-10-CM | POA: Diagnosis not present

## 2017-03-30 DIAGNOSIS — K449 Diaphragmatic hernia without obstruction or gangrene: Secondary | ICD-10-CM | POA: Diagnosis not present

## 2017-03-30 DIAGNOSIS — R011 Cardiac murmur, unspecified: Secondary | ICD-10-CM | POA: Diagnosis not present

## 2017-03-30 DIAGNOSIS — G8929 Other chronic pain: Secondary | ICD-10-CM | POA: Diagnosis not present

## 2017-03-30 DIAGNOSIS — Z7982 Long term (current) use of aspirin: Secondary | ICD-10-CM | POA: Diagnosis not present

## 2017-03-30 DIAGNOSIS — G629 Polyneuropathy, unspecified: Secondary | ICD-10-CM | POA: Diagnosis not present

## 2017-03-30 DIAGNOSIS — K219 Gastro-esophageal reflux disease without esophagitis: Secondary | ICD-10-CM | POA: Diagnosis not present

## 2017-03-30 DIAGNOSIS — G4730 Sleep apnea, unspecified: Secondary | ICD-10-CM | POA: Diagnosis not present

## 2017-03-30 DIAGNOSIS — I252 Old myocardial infarction: Secondary | ICD-10-CM | POA: Diagnosis not present

## 2017-03-30 DIAGNOSIS — M129 Arthropathy, unspecified: Secondary | ICD-10-CM | POA: Diagnosis not present

## 2017-03-30 DIAGNOSIS — Z79899 Other long term (current) drug therapy: Secondary | ICD-10-CM | POA: Diagnosis not present

## 2017-03-30 DIAGNOSIS — I10 Essential (primary) hypertension: Secondary | ICD-10-CM | POA: Diagnosis not present

## 2017-03-30 DIAGNOSIS — Z510 Encounter for antineoplastic radiation therapy: Secondary | ICD-10-CM | POA: Diagnosis not present

## 2017-03-31 ENCOUNTER — Other Ambulatory Visit: Payer: Self-pay | Admitting: Internal Medicine

## 2017-03-31 NOTE — Telephone Encounter (Signed)
Done hardcopy to [place]  

## 2017-04-27 ENCOUNTER — Encounter: Payer: Self-pay | Admitting: Internal Medicine

## 2017-04-27 ENCOUNTER — Other Ambulatory Visit (INDEPENDENT_AMBULATORY_CARE_PROVIDER_SITE_OTHER): Payer: Medicare Other

## 2017-04-27 ENCOUNTER — Ambulatory Visit (INDEPENDENT_AMBULATORY_CARE_PROVIDER_SITE_OTHER)
Admission: RE | Admit: 2017-04-27 | Discharge: 2017-04-27 | Disposition: A | Discharge status: Home / Self Care | Payer: Medicare Other | Source: Ambulatory Visit | Attending: Internal Medicine | Admitting: Internal Medicine

## 2017-04-27 ENCOUNTER — Ambulatory Visit (INDEPENDENT_AMBULATORY_CARE_PROVIDER_SITE_OTHER): Payer: Medicare Other | Admitting: Internal Medicine

## 2017-04-27 VITALS — BP 136/64 | HR 67 | Temp 98.0°F | Ht 70.5 in | Wt 199.0 lb

## 2017-04-27 DIAGNOSIS — J069 Acute upper respiratory infection, unspecified: Secondary | ICD-10-CM | POA: Diagnosis not present

## 2017-04-27 DIAGNOSIS — R39198 Other difficulties with micturition: Secondary | ICD-10-CM | POA: Insufficient documentation

## 2017-04-27 DIAGNOSIS — R410 Disorientation, unspecified: Secondary | ICD-10-CM | POA: Diagnosis not present

## 2017-04-27 DIAGNOSIS — R509 Fever, unspecified: Secondary | ICD-10-CM

## 2017-04-27 DIAGNOSIS — R05 Cough: Secondary | ICD-10-CM | POA: Diagnosis not present

## 2017-04-27 DIAGNOSIS — G4700 Insomnia, unspecified: Secondary | ICD-10-CM | POA: Diagnosis not present

## 2017-04-27 HISTORY — DX: Insomnia, unspecified: G47.00

## 2017-04-27 HISTORY — DX: Disorientation, unspecified: R41.0

## 2017-04-27 HISTORY — DX: Acute upper respiratory infection, unspecified: J06.9

## 2017-04-27 HISTORY — DX: Fever, unspecified: R50.9

## 2017-04-27 LAB — URINALYSIS, ROUTINE W REFLEX MICROSCOPIC
Bilirubin Urine: NEGATIVE
Hgb urine dipstick: NEGATIVE
Ketones, ur: NEGATIVE
Leukocytes, UA: NEGATIVE
Nitrite: NEGATIVE
RBC / HPF: NONE SEEN
Specific Gravity, Urine: 1.02
Total Protein, Urine: NEGATIVE
Urine Glucose: NEGATIVE
Urobilinogen, UA: 2 — AB
pH: 6 (ref 5.0–8.0)

## 2017-04-27 MED ORDER — LEVOFLOXACIN 500 MG PO TABS: 500.0000 mg | ORAL_TABLET | Freq: Every day | ORAL | 0 refills | Status: AC

## 2017-04-27 MED ORDER — TRAZODONE HCL 50 MG PO TABS: 25.0000 mg | ORAL_TABLET | Freq: Every evening | ORAL | 1 refills | Status: DC | PRN

## 2017-04-27 MED ORDER — CEFTRIAXONE SODIUM 1 G IJ SOLR
1.0000 g | Freq: Once | INTRAMUSCULAR | Status: AC
  Administered 2017-04-27: 1 g via INTRAMUSCULAR

## 2017-04-27 NOTE — Telephone Encounter (Signed)
Pt and wife have been informed of xray results. I told them I will be in touch when the UA results come in.

## 2017-04-27 NOTE — Progress Notes (Signed)
Pre visit review using our clinic review tool, if applicable. No additional management support is needed unless otherwise documented below in the visit note. 

## 2017-04-27 NOTE — Telephone Encounter (Signed)
-----   Message from Biagio Borg, MD sent at 04/27/2017 12:41 PM EDT ----- Left message on MyChart, pt to cont same tx except  The test results show that your current treatment is OK, except there is the possibility of mild pneumonia.  Please make sure to take the antibiotic as you have been prescribed.    Abraham Entwistle to please inform pt's wife

## 2017-04-27 NOTE — Assessment & Plan Note (Signed)
Mild cognitive slowed today but still oriented, may have some concentration and ST memory difficulty today but only in last wk, so suspect this may be early TME, ok to follow with tx as above

## 2017-04-27 NOTE — Progress Notes (Signed)
Subjective:    Patient ID: Albert Wade, male    DOB: 06-18-1946, 71 y.o.   [name]: [id number]  HPI  Here with 1 wk onset fever to 101, HA, ears hurt, neck hurts to the right posteriorly, ST, and now worsening prod cough with mild sob/doe, mild confusion but Pt denies chest pain, wheezing, orthopnea, PND, increased LE swelling, palpitations, dizziness or syncope.  Pt continues to have recurring LBP without change in severity, bowel or bladder change, fever, wt loss,  worsening LE pain/numbness/weakness, gait change or falls.  Had f/u with pain medicine today but missed due to illness. Plans to reschedule  Has hx of UTI with sepsis, and may have had some urinary slowing in last few days, but Denies urinary symptoms such as dysuria, frequency, urgency, flank pain, hematuria. Past Medical History:  Diagnosis Date  . Anxiety   . Arthritis    back,wrists,hx. previous fractures  . BPH (benign prostatic hypertrophy)   . CAD (coronary artery disease)    angioplasty   . Chronic back pain   . Chronic bronchitis   . Depression   . GERD (gastroesophageal reflux disease)   . H/O hiatal hernia    pts wife states pt does not have   . Heart murmur   . Hypercholesteremia   . Hypertension   . MI (myocardial infarction) ([place])    mid-'90's  . Peripheral neuropathy    peripheral neuropathy  . Pneumonia   . Prostate cancer ([place])   . Sleep apnea    mild-no cpap, unable to tolerate  . Umbilical hernia   . Varicose veins    Past Surgical History:  Procedure Laterality Date  . ANGIOPLASTY    . APPENDECTOMY    . BACK SURGERY     x3  . CARDIAC CATHETERIZATION     11'11  . CIRCUMCISION N/A 10/21/2013   Procedure: CIRCUMCISION ADULT;  Surgeon: Bernestine Amass, MD;  Location: [facility];  Service: Urology;  Laterality: N/A;  . CYSTOSCOPY N/A 10/21/2013   Procedure: CYSTOSCOPY FLEXIBLE;  Surgeon: Bernestine Amass, MD;  Location: [facility];  Service: Urology;  Laterality: N/A;  . INSERTION OF MESH N/A 06/17/2016     Procedure: INSERTION OF MESH;  Surgeon: Excell Seltzer, MD;  Location: [facility];  Service: General;  Laterality: N/A;  . UMBILICAL HERNIA REPAIR N/A 06/17/2016   Procedure: REPAIR UMBILICAL HERNIA WITH MESH;  Surgeon: Excell Seltzer, MD;  Location: [facility];  Service: General;  Laterality: N/A;  . WRIST SURGERY Right     reports that he quit smoking about 21 years ago. His smoking use included Cigarettes. He has a 20.00 pack-year smoking history. He has never used smokeless tobacco. He reports that he does not drink alcohol or use drugs. family history includes Emphysema in his father; Hyperlipidemia in his father and mother; Prostate cancer in his father. Allergies  Allergen Reactions  . Seroquel [Quetiapine] Hives   Current Outpatient Prescriptions on File Prior to Visit  Medication Sig Dispense Refill  . albuterol (PROVENTIL HFA;VENTOLIN HFA) 108 (90 Base) MCG/ACT inhaler Inhale into the lungs every 6 (six) hours as needed for wheezing or shortness of breath.    . ALPRAZolam (XANAX) 1 MG tablet Take 1 tablet (1 mg total) by mouth 2 (two) times daily as needed for anxiety. 30 tablet 2  . aspirin 81 MG tablet Take 81 mg by mouth daily.    . budesonide-formoterol (SYMBICORT) 160-4.5 MCG/ACT inhaler Inhale 2 puffs into the lungs 2 (two)  times daily.    . DULoxetine (CYMBALTA) 30 MG capsule Take 30 mg by mouth daily.    . furosemide (LASIX) 40 MG tablet Take1 tablet on day one(40 mg)Then take1 and1/2 tab on day two(60mg )and then take two tablets on daythree(80mg ).Then go back to day one 165 tablet 5  . methadone (DOLOPHINE) 10 MG tablet Take 10 mg by mouth 4 (four) times daily.     . mirabegron ER (MYRBETRIQ) 25 MG TB24 tablet Take 1 tablet (25 mg total) by mouth daily. 30 tablet 5  . oxyCODONE-acetaminophen (PERCOCET) 10-325 MG tablet Take 1 tablet by mouth every 4 (four) hours as needed for pain. 30 tablet 0  . potassium chloride (KLOR-CON 10) 10 MEQ tablet 2 tabs by mouth per day on days  taking furosemide 180 tablet 3  . simvastatin (ZOCOR) 80 MG tablet TAKE 1 TABLET BY MOUTH EVERY EVENING 90 tablet 0  . sucralfate (CARAFATE) 1 g tablet Take 1 tablet (1 g total) by mouth 3 (three) times daily. 90 tablet 3  . tamsulosin (FLOMAX) 0.4 MG CAPS capsule Take 1 capsule (0.4 mg total) by mouth daily. 30 capsule 11  . zolpidem (AMBIEN) 5 MG tablet TAKE 1 TABLET BY MOUTH EVERY NIGHT AT BEDTIME AS NEEDED FOR SLEEP 30 tablet 1   No current facility-administered medications on file prior to visit.    Review of Systems  Constitutional: Negative for other unusual diaphoresis or sweats HENT: Negative for ear discharge or swelling Eyes: Negative for other worsening visual disturbances Respiratory: Negative for stridor or other swelling  Gastrointestinal: Negative for worsening distension or other blood Genitourinary: Negative for retention or other urinary change Musculoskeletal: Negative for other MSK pain or swelling Skin: Negative for color change or other new lesions Neurological: Negative for worsening tremors and other numbness  Psychiatric/Behavioral: Negative for worsening agitation or other fatigue All other system neg per pt    Objective:   Physical Exam BP 136/64   Pulse 67   Temp 98 F (36.7 C) (Oral)   Ht 5' 10.5" (1.791 m)   Wt 199 lb (90.3 kg)   SpO2 99%   BMI 28.15 kg/m  VS noted, mild to mod ill appearing Constitutional: Pt appears in NAD HENT: Head: NCAT.  Right Ear: External ear normal.  Left Ear: External ear normal.  Bilat tm's with mild erythema.  Max sinus areas non tender.  Pharynx with mod erythema, no exudate Eyes: . Pupils are equal, round, and reactive to light. Conjunctivae and EOM are normal Nose: without d/c or deformity Neck: Neck supple. Gross normal ROM, + tender area right upper neck at occipital without rash or swelling Cardiovascular: Normal rate and regular rhythm.   Pulmonary/Chest: Effort normal and breath sounds decreased but without  specific rales or wheezing.  Neurological: Pt is alert. At baseline orientation but noted cognitive slowed, motor grossly intact Skin: Skin is warm. No rashes, other new lesions, no LE edema Psychiatric: Pt behavior is normal without agitation  No other exam findings    Assessment & Plan:

## 2017-04-27 NOTE — Assessment & Plan Note (Signed)
Maybe less volume due to reduced po intake?  Will check urine studies due to hx of urosepsis

## 2017-04-27 NOTE — Assessment & Plan Note (Signed)
Chest exam non specific, but I suspect possible mild pna/cap - for cxr

## 2017-04-27 NOTE — Assessment & Plan Note (Signed)
With some difficulty sleeping I suspect due to illness, but wife asks for trial other med to see if can improve, ok for change ambien to trazodone qhs prn

## 2017-04-27 NOTE — Patient Instructions (Signed)
Please take all new medication as prescribed - the antibiotic  You also had the antibiotic shot today  OK to change the ambien to trazodone for sleep  Please continue all other medications as before, and refills have been done if requested.  Please have the pharmacy call with any other refills you may need.  Please keep your appointments with your specialists as you may have planned  Please go to the XRAY Department in the Basement (go straight as you get off the elevator) for the x-ray testing  Please go to the LAB in the Basement (turn left off the elevator) for the tests to be done today - just the urine testing today  You will be contacted by phone if any changes need to be made immediately.  Otherwise, you will receive a letter about your results with an explanation, but please check with MyChart first.  Please remember to sign up for MyChart if you have not done so, as this will be important to you in the future with finding out test results, communicating by private email, and scheduling acute appointments online when needed.

## 2017-04-28 ENCOUNTER — Encounter: Payer: Self-pay | Admitting: Internal Medicine

## 2017-04-28 LAB — URINE CULTURE: Organism ID, Bacteria: NO GROWTH

## 2017-05-03 ENCOUNTER — Ambulatory Visit: Payer: Medicare Other | Admitting: Radiation Oncology

## 2017-05-04 DIAGNOSIS — M19041 Primary osteoarthritis, right hand: Secondary | ICD-10-CM | POA: Diagnosis not present

## 2017-05-11 ENCOUNTER — Encounter: Payer: Self-pay | Admitting: Radiation Oncology

## 2017-05-11 ENCOUNTER — Ambulatory Visit
Admission: RE | Admit: 2017-05-11 | Discharge: 2017-05-11 | Disposition: A | Discharge status: Home / Self Care | Payer: Medicare Other | Source: Ambulatory Visit | Attending: Radiation Oncology | Admitting: Radiation Oncology

## 2017-05-11 VITALS — BP 146/74 | HR 73 | Temp 96.8°F | Wt 196.9 lb

## 2017-05-11 DIAGNOSIS — M129 Arthropathy, unspecified: Secondary | ICD-10-CM | POA: Insufficient documentation

## 2017-05-11 DIAGNOSIS — Z923 Personal history of irradiation: Secondary | ICD-10-CM | POA: Diagnosis not present

## 2017-05-11 DIAGNOSIS — C61 Malignant neoplasm of prostate: Secondary | ICD-10-CM | POA: Insufficient documentation

## 2017-05-11 NOTE — Progress Notes (Signed)
Radiation Oncology Follow up Note  Name: Albert Wade   Date:   05/11/2017 [name]:  [id number] DOB: 11-24-1946    This 71 y.o. male presents to the clinic today for one-month follow-up status post I MRT radiation therapy to his prostate and pelvic nodes for adenocarcinoma the prostate.  REFERRING PROVIDER: Biagio Borg, MD  HPI: Patient is a 71 year old male now out 1 month having completed IM RT radiation therapy to his prostate and pelvic nodes for. 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 Seen today in routine follow-up he is doing well. Specifically denies diarrhea dysuria or any other GI/GU complaints. He's had some exacerbation of her arthritis in his right wrist and is had recent cortisone shot for that as well as wearing an arm brace.  COMPLICATIONS OF TREATMENT: none  FOLLOW UP COMPLIANCE: keeps appointments   PHYSICAL EXAM:  BP (!) 146/74   Pulse 73   Temp (!) 96.8 F (36 C)   Wt 196 lb 13.9 oz (89.3 kg)   BMI 27.85 kg/m  On rectal exam rectal sphincter tone is good. Prostate is smooth contracted without evidence of nodularity or mass. Sulcus is preserved bilaterally. No discrete nodularity is identified. No other rectal abnormalities are noted. Well-developed well-nourished patient in NAD. HEENT reveals PERLA, EOMI, discs not visualized.  Oral cavity is clear. No oral mucosal lesions are identified. Neck is clear without evidence of cervical or supraclavicular adenopathy. Lungs are clear to A&P. Cardiac examination is essentially unremarkable with regular rate and rhythm without murmur rub or thrill. Abdomen is benign with no organomegaly or masses noted. Motor sensory and DTR levels are equal and symmetric in the upper and lower extremities. Cranial nerves II through XII are grossly intact. Proprioception is intact. No peripheral adenopathy or edema is identified. No motor or sensory levels are noted. Crude  visual fields are within normal range.  RADIOLOGY RESULTS: No current films for review  PLAN: Present time he is doing nicely with very little side effect profile from his recent radiation therapy. I am please was overall progress. I've scheduled for follow-up in 4 months and we'll do a PSA one week prior to that visit. Patient knows to call sooner with any concerns.  I would like to take this opportunity to thank you for allowing me to participate in the care of your patient.Armstead Peaks., MD

## 2017-05-16 ENCOUNTER — Other Ambulatory Visit: Payer: Self-pay

## 2017-05-16 MED ORDER — POTASSIUM CHLORIDE ER 10 MEQ PO TBCR: EXTENDED_RELEASE_TABLET | ORAL | 3 refills | Status: DC

## 2017-05-17 ENCOUNTER — Telehealth: Payer: Self-pay | Admitting: Internal Medicine

## 2017-05-17 MED ORDER — POTASSIUM CHLORIDE ER 10 MEQ PO TBCR: EXTENDED_RELEASE_TABLET | ORAL | 3 refills | Status: DC

## 2017-05-17 NOTE — Telephone Encounter (Signed)
Done

## 2017-05-17 NOTE — Telephone Encounter (Signed)
Pt does not use [facility] pharmacy at all, please remove from Pts chart  Pleas resend potassium chloride (KLOR-CON 10) 10 MEQ tablet To [facility] in [place]

## 2017-06-08 ENCOUNTER — Other Ambulatory Visit: Payer: Self-pay

## 2017-06-08 MED ORDER — ALBUTEROL SULFATE HFA 108 (90 BASE) MCG/ACT IN AERS: 1.0000 | INHALATION_SPRAY | Freq: Four times a day (QID) | RESPIRATORY_TRACT | 3 refills | Status: DC | PRN

## 2017-06-08 NOTE — Telephone Encounter (Signed)
Dr. Jenny Reichmann please advise on the dispense quantity.

## 2017-06-15 DIAGNOSIS — G894 Chronic pain syndrome: Secondary | ICD-10-CM | POA: Diagnosis not present

## 2017-06-15 DIAGNOSIS — M545 Low back pain: Secondary | ICD-10-CM | POA: Diagnosis not present

## 2017-06-15 DIAGNOSIS — M25511 Pain in right shoulder: Secondary | ICD-10-CM | POA: Diagnosis not present

## 2017-06-15 DIAGNOSIS — M542 Cervicalgia: Secondary | ICD-10-CM | POA: Diagnosis not present

## 2017-08-09 ENCOUNTER — Encounter: Payer: Self-pay | Admitting: Internal Medicine

## 2017-08-24 DIAGNOSIS — G894 Chronic pain syndrome: Secondary | ICD-10-CM | POA: Diagnosis not present

## 2017-08-24 DIAGNOSIS — M5416 Radiculopathy, lumbar region: Secondary | ICD-10-CM | POA: Diagnosis not present

## 2017-08-24 DIAGNOSIS — M542 Cervicalgia: Secondary | ICD-10-CM | POA: Diagnosis not present

## 2017-08-24 DIAGNOSIS — M25511 Pain in right shoulder: Secondary | ICD-10-CM | POA: Diagnosis not present

## 2017-08-24 DIAGNOSIS — Z79891 Long term (current) use of opiate analgesic: Secondary | ICD-10-CM | POA: Diagnosis not present

## 2017-08-24 DIAGNOSIS — M545 Low back pain: Secondary | ICD-10-CM | POA: Diagnosis not present

## 2017-08-25 DIAGNOSIS — G894 Chronic pain syndrome: Secondary | ICD-10-CM | POA: Diagnosis not present

## 2017-08-25 NOTE — Progress Notes (Deleted)
Cardiology Office Note   Date:  08/25/2017   ID:  [name], [name] June 06, 1946, [name] [id number]  PCP:  Biagio Borg, MD  Cardiologist:   Dorris Carnes, MD       History of Present Illness: Albert Wade is a 71 y.o. male with a history of CAD COPD, HL, HTN  I saw her in march 2017       No outpatient prescriptions have been marked as taking for the 09/01/17 encounter (Appointment) with Fay Records, MD.     Allergies:   Seroquel [quetiapine]   Past Medical History:  Diagnosis Date  . Anxiety   . Arthritis    back,wrists,hx. previous fractures  . BPH (benign prostatic hypertrophy)   . CAD (coronary artery disease)    angioplasty   . Chronic back pain   . Chronic bronchitis   . Depression   . GERD (gastroesophageal reflux disease)   . H/O hiatal hernia    pts wife states pt does not have   . Heart murmur   . Hypercholesteremia   . Hypertension   . MI (myocardial infarction) ([place])    mid-'90's  . Peripheral neuropathy    peripheral neuropathy  . Pneumonia   . Prostate cancer ([place])   . Sleep apnea    mild-no cpap, unable to tolerate  . Umbilical hernia   . Varicose veins     Past Surgical History:  Procedure Laterality Date  . ANGIOPLASTY    . APPENDECTOMY    . BACK SURGERY     x3  . CARDIAC CATHETERIZATION     11'11  . CIRCUMCISION N/A 10/21/2013   Procedure: CIRCUMCISION ADULT;  Surgeon: Bernestine Amass, MD;  Location: [facility];  Service: Urology;  Laterality: N/A;  . CYSTOSCOPY N/A 10/21/2013   Procedure: CYSTOSCOPY FLEXIBLE;  Surgeon: Bernestine Amass, MD;  Location: [facility];  Service: Urology;  Laterality: N/A;  . INSERTION OF MESH N/A 06/17/2016   Procedure: INSERTION OF MESH;  Surgeon: Excell Seltzer, MD;  Location: [facility];  Service: General;  Laterality: N/A;  . UMBILICAL HERNIA REPAIR N/A 06/17/2016   Procedure: REPAIR UMBILICAL HERNIA WITH MESH;  Surgeon: Excell Seltzer, MD;  Location: [facility];  Service: General;  Laterality: N/A;  .  WRIST SURGERY Right      Social History:  The patient  reports that he quit smoking about 21 years ago. His smoking use included Cigarettes. He has a 20.00 pack-year smoking history. He has never used smokeless tobacco. He reports that he does not drink alcohol or use drugs.   Family History:  The patient's family history includes Emphysema in his father; Hyperlipidemia in his father and mother; Prostate cancer in his father.    ROS:  Please see the history of present illness. All other systems are reviewed and  Negative to the above problem except as noted.    PHYSICAL EXAM: VS:  There were no vitals taken for this visit.  GEN: Well nourished, well developed, in no acute distress  HEENT: normal  Neck: no JVD, carotid bruits, or masses Cardiac: RRR; no murmurs, rubs, or gallops,no edema  Respiratory:  clear to auscultation bilaterally, normal work of breathing GI: soft, nontender, nondistended, + BS  No hepatomegaly  MS: no deformity Moving all extremities   Skin: warm and dry, no rash Neuro:  Strength and sensation are intact Psych: euthymic mood, full affect   EKG:  EKG is ordered today.   Lipid Panel  Component Value Date/Time   CHOL 187 05/19/2016 1610   TRIG (H) 05/19/2016 1610    426.0 Triglyceride is over 400; calculations on Lipids are invalid.   HDL 28.30 (L) 05/19/2016 1610   CHOLHDL 7 05/19/2016 1610   VLDL 16.4 06/01/2015 1034   LDLCALC 41 06/01/2015 1034   LDLDIRECT 78.0 05/19/2016 1610      Wt Readings from Last 3 Encounters:  05/11/17 196 lb 13.9 oz (89.3 kg)  04/27/17 199 lb (90.3 kg)  01/18/17 213 lb (96.6 kg)      ASSESSMENT AND PLAN:     Current medicines are reviewed at length with the patient today.  The patient does not have concerns regarding medicines.  Signed, Dorris Carnes, MD  08/25/2017 10:41 PM    [address], [place], [place]  [address] Phone: [phone]; Fax: [phone]

## 2017-08-30 ENCOUNTER — Other Ambulatory Visit: Payer: Self-pay

## 2017-08-30 MED ORDER — FUROSEMIDE 40 MG PO TABS: ORAL_TABLET | ORAL | 5 refills | Status: DC

## 2017-09-01 ENCOUNTER — Ambulatory Visit: Payer: Medicare Other | Admitting: Internal Medicine

## 2017-09-21 ENCOUNTER — Inpatient Hospital Stay: Payer: Medicare Other | Attending: Radiation Oncology

## 2017-09-21 ENCOUNTER — Other Ambulatory Visit: Payer: Self-pay

## 2017-09-21 DIAGNOSIS — C61 Malignant neoplasm of prostate: Secondary | ICD-10-CM | POA: Insufficient documentation

## 2017-09-21 LAB — PSA: Prostatic Specific Antigen: 0.06 ng/mL (ref 0.00–4.00)

## 2017-09-28 ENCOUNTER — Encounter: Payer: Self-pay | Admitting: Radiation Oncology

## 2017-09-28 ENCOUNTER — Ambulatory Visit
Admission: RE | Admit: 2017-09-28 | Discharge: 2017-09-28 | Disposition: A | Discharge status: Home / Self Care | Payer: Medicare Other | Source: Ambulatory Visit | Attending: Radiation Oncology | Admitting: Radiation Oncology

## 2017-09-28 ENCOUNTER — Other Ambulatory Visit: Payer: Self-pay | Admitting: *Deleted

## 2017-09-28 VITALS — BP 134/68 | HR 68 | Temp 97.4°F | Resp 18 | Wt 209.2 lb

## 2017-09-28 DIAGNOSIS — C61 Malignant neoplasm of prostate: Secondary | ICD-10-CM

## 2017-09-28 DIAGNOSIS — Z923 Personal history of irradiation: Secondary | ICD-10-CM | POA: Diagnosis not present

## 2017-09-28 NOTE — Progress Notes (Signed)
Radiation Oncology Follow up Note  Name: Albert Wade   Date:   09/28/2017 [name]:  [id number] DOB: [birth year]    This 71 y.o. male presents to the clinic today for 5 month follow-up status post I MRT radiation therapy for stage IIB adenocarcinoma the prostate.  REFERRING PROVIDER: Biagio Borg, MD  HPI: patient is a 71 year old male now out 5 months having completed IM RT radiation therapy to his prostate and pelvic nodes for stage IIB (T1 CN 0 M0) adenocarcinoma Gleason 8 (4+4) presenting with a PSA of 17.6. Seen today in routine follow-up he is doing well. He specifically denies diarrhea dysuria or any other GI/GU complaints. His most recent PSA was 0.06. He has been on Lupron suppression we referring him back to urology's office for continuation of that..  COMPLICATIONS OF TREATMENT: none  FOLLOW UP COMPLIANCE: keeps appointments   PHYSICAL EXAM:  BP 134/68   Pulse 68   Temp (!) 97.4 F (36.3 C)   Resp 18   Wt 209 lb 3.5 oz (94.9 kg)   BMI 29.60 kg/m  On rectal exam rectal sphincter tone is good. Prostate is smooth contracted without evidence of nodularity or mass. Sulcus is preserved bilaterally. No discrete nodularity is identified. No other rectal abnormalities are noted.Well-developed well-nourished patient in NAD. HEENT reveals PERLA, EOMI, discs not visualized.  Oral cavity is clear. No oral mucosal lesions are identified. Neck is clear without evidence of cervical or supraclavicular adenopathy. Lungs are clear to A&P. Cardiac examination is essentially unremarkable with regular rate and rhythm without murmur rub or thrill. Abdomen is benign with no organomegaly or masses noted. Motor sensory and DTR levels are equal and symmetric in the upper and lower extremities. Cranial nerves II through XII are grossly intact. Proprioception is intact. No peripheral adenopathy or edema is identified. No motor or sensory levels are noted. Crude visual fields are within normal  range.  RADIOLOGY RESULTS: no current films for review  PLAN: present time patient is doing well under excellent biochemical control of his prostate cancer. I'm please was overall progress. I've set up a follow-up appointment with urology to continue his hormonal suppression ADT. Otherwise I've asked to see him back in 6 months with repeat PSA at that time. Patient is to call sooner with any concerns.  I would like to take this opportunity to thank you for allowing me to participate in the care of your patient.Armstead Peaks., MD

## 2017-10-06 ENCOUNTER — Other Ambulatory Visit: Payer: Self-pay | Admitting: Internal Medicine

## 2017-10-17 ENCOUNTER — Ambulatory Visit: Payer: Medicare Other | Admitting: Physician Assistant

## 2017-10-19 DIAGNOSIS — G894 Chronic pain syndrome: Secondary | ICD-10-CM | POA: Diagnosis not present

## 2017-10-19 DIAGNOSIS — M545 Low back pain: Secondary | ICD-10-CM | POA: Diagnosis not present

## 2017-10-20 ENCOUNTER — Ambulatory Visit (INDEPENDENT_AMBULATORY_CARE_PROVIDER_SITE_OTHER): Payer: Medicare Other | Admitting: Urology

## 2017-10-20 ENCOUNTER — Encounter: Payer: Self-pay | Admitting: Urology

## 2017-10-20 VITALS — BP 167/68 | HR 59 | Ht 70.5 in | Wt 216.8 lb

## 2017-10-20 DIAGNOSIS — C61 Malignant neoplasm of prostate: Secondary | ICD-10-CM

## 2017-10-20 DIAGNOSIS — N400 Enlarged prostate without lower urinary tract symptoms: Secondary | ICD-10-CM

## 2017-10-20 MED ORDER — LEUPROLIDE ACETATE (3 MONTH) 22.5 MG IM KIT
22.5000 mg | PACK | Freq: Once | INTRAMUSCULAR | Status: AC
  Administered 2017-10-20: 22.5 mg via INTRAMUSCULAR

## 2017-10-20 NOTE — Patient Instructions (Addendum)
Discussed importance of bone health on ADT, recommend 1000-1200 mg daily calcium suppliment and [phone] IU vit D daily.  Also encouragedweight being exercises and cardiovascular health.   Leuprolide depot injection What is this medicine? LEUPROLIDE (loo PROE lide) is a man-made protein that acts like a natural hormone in the body. It decreases testosterone in men and decreases estrogen in women. In men, this medicine is used to treat advanced prostate cancer. In women, some forms of this medicine may be used to treat endometriosis, uterine fibroids, or other male hormone-related problems. This medicine may be used for other purposes; ask your health care provider or pharmacist if you have questions. COMMON BRAND NAME(S): Eligard, Lupron Depot, Lupron Depot-Ped, Viadur What should I tell my health care provider before I take this medicine? They need to know if you have any of these conditions: -diabetes -heart disease or previous heart attack -high blood pressure -high cholesterol -mental illness -osteoporosis -pain or difficulty passing urine -seizures -spinal cord metastasis -stroke -suicidal thoughts, plans, or attempt; a previous suicide attempt by you or a family member -tobacco smoker -unusual vaginal bleeding (women) -an unusual or allergic reaction to leuprolide, benzyl alcohol, other medicines, foods, dyes, or preservatives -pregnant or trying to get pregnant -breast-feeding How should I use this medicine? This medicine is for injection into a muscle or for injection under the skin. It is given by a health care professional in a hospital or clinic setting. The specific product will determine how it will be given to you. Make sure you understand which product you receive and how often you will receive it. Talk to your pediatrician regarding the use of this medicine in children. Special care may be needed. Overdosage: If you think you have taken too much of this medicine contact  a poison control center or emergency room at once. NOTE: This medicine is only for you. Do not share this medicine with others. What if I miss a dose? It is important not to miss a dose. Call your doctor or health care professional if you are unable to keep an appointment. Depot injections: Depot injections are given either once-monthly, every 12 weeks, every 16 weeks, or every 24 weeks depending on the product you are prescribed. The product you are prescribed will be based on if you are male or male, and your condition. Make sure you understand your product and dosing. What may interact with this medicine? Do not take this medicine with any of the following medications: -chasteberry This medicine may also interact with the following medications: -herbal or dietary supplements, like black cohosh or DHEA -male hormones, like estrogens or progestins and birth control pills, patches, rings, or injections -male hormones, like testosterone This list may not describe all possible interactions. Give your health care provider a list of all the medicines, herbs, non-prescription drugs, or dietary supplements you use. Also tell them if you smoke, drink alcohol, or use illegal drugs. Some items may interact with your medicine. What should I watch for while using this medicine? Visit your doctor or health care professional for regular checks on your progress. During the first weeks of treatment, your symptoms may get worse, but then will improve as you continue your treatment. You may get hot flashes, increased bone pain, increased difficulty passing urine, or an aggravation of nerve symptoms. Discuss these effects with your doctor or health care professional, some of them may improve with continued use of this medicine. Male patients may experience a menstrual cycle or spotting during  the first months of therapy with this medicine. If this continues, contact your doctor or health care professional. What  side effects may I notice from receiving this medicine? Side effects that you should report to your doctor or health care professional as soon as possible: -allergic reactions like skin rash, itching or hives, swelling of the face, lips, or tongue -breathing problems -chest pain -depression or memory disorders -pain in your legs or groin -pain at site where injected or implanted -seizures -severe headache -swelling of the feet and legs -suicidal thoughts or other mood changes -visual changes -vomiting Side effects that usually do not require medical attention (report to your doctor or health care professional if they continue or are bothersome): -breast swelling or tenderness -decrease in sex drive or performance -diarrhea -hot flashes -loss of appetite -muscle, joint, or bone pains -nausea -redness or irritation at site where injected or implanted -skin problems or acne This list may not describe all possible side effects. Call your doctor for medical advice about side effects. You may report side effects to FDA at 1-800-FDA-1088. Where should I keep my medicine? This drug is given in a hospital or clinic and will not be stored at home. NOTE: This sheet is a summary. It may not cover all possible information. If you have questions about this medicine, talk to your doctor, pharmacist, or health care provider.  2018 Elsevier/Gold Standard (2016-05-26 09:45:53)

## 2017-10-20 NOTE — Progress Notes (Addendum)
Lupron IM Injection   Due to Prostate Cancer patient is present today for a Lupron Injection.  Medication: Lupron 3 month Dose:  mg  Location: left upper outer buttocks Lot: [id number] Exp: 02/13/2020  Patient tolerated well, no complications were noted  Performed by: Elberta Leatherwood, CMA  Follow up: Follow up in 3 months for a 6 month Lupron

## 2017-10-20 NOTE — Progress Notes (Signed)
10/20/2017 12:48 PM   Albert Wade April 09, 1946 [id number]  Referring provider: Biagio Borg, MD [address] [place], [place] [address]  Chief Complaint  Patient presents with  . Prostate Cancer    HPI: 71 year old male with history of BPH and high risk prostate cancer status post IM RT/Lupron presents today for routine follow-up.  He was diagnosed with prostate cancer in 11/2016.  PSA at the time of biopsy was 17.6. He has 6 of 12 cores positive. 2 of the cores of Gleason 4+4 = 8 disease. 3of the cores have Gleason 4+3 = 7 disease. One core was Gleason 3+3 = 6 disease. All cores that were positive ranged from 50 to 100% volume disease. TRUS vol 21 g.   He is now completed IMRT as of 03/2017.   Well-tolerated.  His last Lupron was 01/02/17 given single 4 month dose.  He did have some hot flashes but otherwise no issues.  He does not take calcium or vitamin D supplements.  He is overdue for Lupron.  Recent PSA 0.06 on 09/21/2017.  He denies any significant voiding symptoms today.  IPSS as below.  He continues to take Flomax which is helpful.       IPSS    Row Name 10/20/17 1000         International Prostate Symptom Score   How often have you had the sensation of not emptying your bladder? Not at All     How often have you had to urinate less than every two hours? Less than half the time     How often have you found you stopped and started again several times when you urinated? Not at All     How often have you found it difficult to postpone urination? Almost always     How often have you had a weak urinary stream? Not at All     How often have you had to strain to start urination? Not at All     How many times did you typically get up at night to urinate? 2 Times     Total IPSS Score 9       Quality of Life due to urinary symptoms   If you were to spend the rest of your life with your urinary condition just the way it is now how would you feel about that? Mixed         Score:  1-7 Mild 8-19 Moderate 20-35 Severe    PMH: Past Medical History:  Diagnosis Date  . Anxiety   . Arthritis    back,wrists,hx. previous fractures  . BPH (benign prostatic hypertrophy)   . CAD (coronary artery disease)    angioplasty   . Chronic back pain   . Chronic bronchitis   . Depression   . GERD (gastroesophageal reflux disease)   . H/O hiatal hernia    pts wife states pt does not have   . Heart murmur   . Hypercholesteremia   . Hypertension   . MI (myocardial infarction) ([place])    mid-'90's  . Peripheral neuropathy    peripheral neuropathy  . Pneumonia   . Prostate cancer ([place])   . Sleep apnea    mild-no cpap, unable to tolerate  . Umbilical hernia   . Varicose veins     Surgical History: Past Surgical History:  Procedure Laterality Date  . ANGIOPLASTY    . APPENDECTOMY    . BACK SURGERY  x3  . CARDIAC CATHETERIZATION     11'11  . CIRCUMCISION N/A 10/21/2013   Procedure: CIRCUMCISION ADULT;  Surgeon: Bernestine Amass, MD;  Location: [facility];  Service: Urology;  Laterality: N/A;  . CYSTOSCOPY N/A 10/21/2013   Procedure: CYSTOSCOPY FLEXIBLE;  Surgeon: Bernestine Amass, MD;  [facility];  Service: Urology;  Laterality: N/A;  . INSERTION OF MESH N/A 06/17/2016   Procedure: INSERTION OF MESH;  Surgeon: Excell Seltzer, MD;  [facility];  Service: General;  Laterality: N/A;  . UMBILICAL HERNIA REPAIR N/A 06/17/2016   Procedure: REPAIR UMBILICAL HERNIA WITH MESH;  Surgeon: Excell Seltzer, MD;  [facility];  Service: General;  Laterality: N/A;  . WRIST SURGERY Right     Home Medications:  Allergies as of 10/20/2017      Reactions   Seroquel [quetiapine] Hives      Medication List       Accurate as of 10/20/17 12:48 PM. Always use your most recent med list.          albuterol 108 (90 Base) MCG/ACT inhaler Commonly known as:  PROVENTIL HFA;VENTOLIN HFA Inhale 1-2 puffs into the lungs every 6 (six) hours as needed for  wheezing or shortness of breath.   ALPRAZolam 1 MG tablet Commonly known as:  XANAX Take 1 tablet (1 mg total) by mouth 2 (two) times daily as needed for anxiety.   aspirin 81 MG tablet Take 81 mg by mouth daily.   budesonide-formoterol 160-4.5 MCG/ACT inhaler Commonly known as:  SYMBICORT Inhale 2 puffs into the lungs 2 (two) times daily.   DULoxetine 30 MG capsule Commonly known as:  CYMBALTA Take 30 mg by mouth daily.   furosemide 40 MG tablet Commonly known as:  LASIX Take1 tablet on day one(40 mg)Then take1 and1/2 tab on day two(60mg )and then take two tablets on daythree(80mg ).Then go back to day one   methadone 10 MG tablet Commonly known as:  DOLOPHINE Take 10 mg by mouth 4 (four) times daily.   oxyCODONE-acetaminophen 10-325 MG tablet Commonly known as:  PERCOCET Take 1 tablet by mouth every 4 (four) hours as needed for pain.   potassium chloride 10 MEQ tablet Commonly known as:  KLOR-CON 10 2 tabs by mouth per day on days taking furosemide   simvastatin 80 MG tablet Commonly known as:  ZOCOR TAKE 1 TABLET BY MOUTH EVERY EVENING   tamsulosin 0.4 MG Caps capsule Commonly known as:  FLOMAX Take 1 capsule (0.4 mg total) by mouth daily.   traZODone 50 MG tablet Commonly known as:  DESYREL TAKE ONE-HALF TO ONE TABLET BY MOUTH AT BEDTIME AS NEEDED FOR SLEEP       Allergies:  Allergies  Allergen Reactions  . Seroquel [Quetiapine] Hives    Family History: Family History  Problem Relation Age of Onset  . Prostate cancer Father   . Emphysema Father   . Hyperlipidemia Father   . Hyperlipidemia Mother     Social History:  reports that he quit smoking about 21 years ago. His smoking use included Cigarettes. He has a 20.00 pack-year smoking history. He has never used smokeless tobacco. He reports that he does not drink alcohol or use drugs.  ROS: UROLOGY Frequent Urination?: Yes Hard to postpone urination?: Yes Burning/pain with urination?: No Get up at  night to urinate?: Yes Leakage of urine?: Yes Urine stream starts and stops?: No Trouble starting stream?: No Do you have to strain to urinate?: No Blood in urine?: No Urinary  tract infection?: No Sexually transmitted disease?: No Injury to kidneys or bladder?: No Painful intercourse?: No Weak stream?: No Erection problems?: No Penile pain?: No  Gastrointestinal Nausea?: No Vomiting?: No Indigestion/heartburn?: No Diarrhea?: No Constipation?: No  Constitutional Fever: No Night sweats?: No Weight loss?: No Fatigue?: No  Skin Skin rash/lesions?: No Itching?: No  Eyes Blurred vision?: No Double vision?: No  Ears/Nose/Throat Sore throat?: No Sinus problems?: No  Hematologic/Lymphatic Swollen glands?: No Easy bruising?: No  Cardiovascular Leg swelling?: Yes Chest pain?: No  Respiratory Cough?: No Shortness of breath?: No  Endocrine Excessive thirst?: No  Musculoskeletal Back pain?: Yes Joint pain?: No  Neurological Headaches?: No Dizziness?: No  Psychologic Depression?: Yes Anxiety?: No  Physical Exam: BP (!) 167/68 (BP Location: Right Arm, Patient Position: Sitting, Cuff Size: Normal)   Pulse (!) 59   Ht 5' 10.5" (1.791 m)   Wt 216 lb 12.8 oz (98.3 kg)   BMI 30.67 kg/m   Constitutional:  Alert and oriented, No acute distress.  Accompanied by wife today. HEENT: [place] AT, moist mucus membranes.  Trachea midline, no masses. Cardiovascular: No clubbing, cyanosis, or edema. Respiratory: Normal respiratory effort, no increased work of breathing. GI: Abdomen is soft, nontender, nondistended, no abdominal masses Skin: No rashes, bruises or suspicious lesions. Neurologic: Grossly intact, no focal deficits, moving all 4 extremities. Psychiatric: Normal mood and affect.  Laboratory Data: Lab Results  Component Value Date   WBC 5.1 03/16/2017   HGB 11.7 (L) 03/16/2017   HCT 33.6 (L) 03/16/2017   MCV 89.0 03/16/2017   PLT 170 03/16/2017    Lab  Results  Component Value Date   CREATININE 0.76 12/05/2016    Lab Results  Component Value Date   PSA1 17.6 (H) 11/11/2016   Component     Latest Ref Rng & Units 09/21/2017  Prostatic Specific Antigen     0.00 - 4.00 ng/mL 0.06    Lab Results  Component Value Date   HGBA1C 6.0 05/19/2016    Urinalysis N/a  Pertinent Imaging: N/a  Assessment & Plan:    1. Prostate cancer ([place]) Prostate cancer status post IM RT Continue to recommend Lupron for 2-3 years, overdue for injection Reviewed side effects of medication, 75-month tablet given today we will transition to 6 months up at next visit Repeat PSA next visit Discussed importance of bone health on ADT, recommend 1000-1200 mg daily calcium suppliment and [phone] IU vit D daily.  Also encouraged weight being exercises and cardiovascular health. - leuprolide (LUPRON) injection 22.5 mg; Inject 22.5 mg into the muscle once.  2. Benign prostatic hyperplasia, unspecified whether lower urinary tract symptoms present Continue Flomax   Return in about 3 months (around 01/20/2018) for Lupron (6 mo depot), PSA.  Hollice Espy, MD  [facility] [address] [place], [place] [address] [phone]

## 2017-10-27 DIAGNOSIS — M5416 Radiculopathy, lumbar region: Secondary | ICD-10-CM | POA: Diagnosis not present

## 2017-11-30 ENCOUNTER — Telehealth: Payer: Self-pay | Admitting: Urology

## 2017-11-30 DIAGNOSIS — R972 Elevated prostate specific antigen [PSA]: Secondary | ICD-10-CM

## 2017-11-30 NOTE — Telephone Encounter (Signed)
Pt wife called office asking for a refill on Flomax, pt is out and doesn't have refills. [facility] [address] @ [phone].

## 2017-12-01 MED ORDER — TAMSULOSIN HCL 0.4 MG PO CAPS: 0.4000 mg | ORAL_CAPSULE | Freq: Every day | ORAL | 11 refills | Status: DC

## 2017-12-01 NOTE — Telephone Encounter (Signed)
Medication refilled and pt made aware.

## 2017-12-28 DIAGNOSIS — M48061 Spinal stenosis, lumbar region without neurogenic claudication: Secondary | ICD-10-CM | POA: Diagnosis not present

## 2017-12-28 DIAGNOSIS — M5416 Radiculopathy, lumbar region: Secondary | ICD-10-CM | POA: Diagnosis not present

## 2017-12-28 DIAGNOSIS — G894 Chronic pain syndrome: Secondary | ICD-10-CM | POA: Diagnosis not present

## 2018-01-05 ENCOUNTER — Ambulatory Visit: Payer: Medicare Other | Admitting: Internal Medicine

## 2018-01-08 ENCOUNTER — Telehealth: Payer: Self-pay | Admitting: Urology

## 2018-01-08 NOTE — Telephone Encounter (Signed)
Ideally yes.  If he is not able to, we can do on the day of the visit.  Hollice Espy, MD

## 2018-01-08 NOTE — Telephone Encounter (Signed)
Did you want the patient to have his labs drawn for PSA prior to his app with you on the 29th?   Sharyn Lull

## 2018-01-09 ENCOUNTER — Encounter: Payer: Self-pay | Admitting: Internal Medicine

## 2018-01-16 ENCOUNTER — Telehealth: Payer: Self-pay | Admitting: Internal Medicine

## 2018-01-16 MED ORDER — SIMVASTATIN 80 MG PO TABS: 80.0000 mg | ORAL_TABLET | Freq: Every evening | ORAL | 0 refills | Status: DC

## 2018-01-16 MED ORDER — TRAZODONE HCL 50 MG PO TABS: ORAL_TABLET | ORAL | 0 refills | Status: DC

## 2018-01-16 NOTE — Telephone Encounter (Signed)
Pt is overdue for annual appt. Must see provider for refills. Pls contact pt make appt for refills.Marland KitchenJohny Chess

## 2018-01-16 NOTE — Telephone Encounter (Signed)
Appt made for a CPE for 2-13

## 2018-01-16 NOTE — Telephone Encounter (Signed)
Copied from [place] [id number]. Topic: Quick Communication - Rx Refill/Question >> Jan 16, 2018  8:55 AM Percell Belt A wrote: Medication:  traZODone (DESYREL) 50 MG tablet [[id number]] , simvastatin (ZOCOR) 80 MG tablet [[id number]]    Has the patient contacted their pharmacy? No    (Agent: If no, request that the patient contact the pharmacy for the refill.)   Preferred Pharmacy (with phone number or street name): [facility] , on file    Agent: Please be advised that RX refills may take up to 3 business days. We ask that you follow-up with your pharmacy.

## 2018-01-16 NOTE — Telephone Encounter (Signed)
Patient requests traZODone (DESYREL) 50 MG tablet, simvastatin (ZOCOR) 80 MG tablet  Last OV for preventive 04/2016; last lipid 05/21/16; both filled last 10/06/17 90 tabs/0 refills.

## 2018-01-16 NOTE — Telephone Encounter (Signed)
Per office policy sent 30 day to local pharmacy until appt.../lmb  

## 2018-01-19 ENCOUNTER — Other Ambulatory Visit: Payer: Medicare Other

## 2018-01-19 DIAGNOSIS — C61 Malignant neoplasm of prostate: Secondary | ICD-10-CM | POA: Diagnosis not present

## 2018-01-19 NOTE — Progress Notes (Unsigned)
ok 

## 2018-01-20 LAB — PSA: Prostate Specific Ag, Serum: <0.1 ng/mL (ref 0.0–4.0)

## 2018-01-23 ENCOUNTER — Ambulatory Visit (INDEPENDENT_AMBULATORY_CARE_PROVIDER_SITE_OTHER): Payer: Medicare Other | Admitting: Urology

## 2018-01-23 VITALS — BP 143/67 | HR 78 | Ht 70.0 in | Wt 221.2 lb

## 2018-01-23 DIAGNOSIS — R350 Frequency of micturition: Secondary | ICD-10-CM | POA: Diagnosis not present

## 2018-01-23 DIAGNOSIS — C61 Malignant neoplasm of prostate: Secondary | ICD-10-CM | POA: Diagnosis not present

## 2018-01-23 LAB — MICROSCOPIC EXAMINATION

## 2018-01-23 LAB — BLADDER SCAN AMB NON-IMAGING

## 2018-01-23 LAB — URINALYSIS, COMPLETE
Bilirubin, UA: NEGATIVE
Glucose, UA: NEGATIVE
Leukocytes, UA: NEGATIVE
Nitrite, UA: NEGATIVE
Protein, UA: NEGATIVE
RBC, UA: NEGATIVE
Specific Gravity, UA: 1.02 (ref 1.005–1.030)
Urobilinogen, Ur: 0.2 mg/dL (ref 0.2–1.0)
pH, UA: 5.5 (ref 5.0–7.5)

## 2018-01-23 MED ORDER — LEUPROLIDE ACETATE (6 MONTH) 45 MG IM KIT
45.0000 mg | PACK | Freq: Once | INTRAMUSCULAR | Status: AC
  Administered 2018-01-23: 45 mg via INTRAMUSCULAR

## 2018-01-23 NOTE — Progress Notes (Signed)
01/23/2018 4:27 PM   Albert Wade 1946/03/11 [id number]  Referring provider: Biagio Borg, MD [address] [place], [place] [address]  Chief Complaint  Patient presents with  . Prostate Cancer    28month    HPI: 72 year old male with history of BPH and high risk prostate cancer status post IM RT/Lupron presents today for routine follow-up.  He was diagnosed with prostate cancer in 11/2016.  PSA at the time of biopsy was 17.6. He has 6 of 12 cores positive. 2 of the cores of Gleason 4+4 = 8 disease. 3of the cores have Gleason 4+3 = 7 disease. One core was Gleason 3+3 = 6 disease. All cores that were positive ranged from 50 to 100% volume disease. TRUS vol 21 g.  He completed IMRT as of 03/2017.  Due for Lupron today.  He has had some hot flashes but otherwise is tolerating the medication well.  He is not performing weightbearing exercise due to back pain.  Recent PSA undetectable as of 01/19/2018.  Today, he complains of urinary frequency and nocturia x 2.  He does drink a lot of water and diet green tea.  He is also had a few episodes of urge incontinence.   He is on Lasix.  Continues to take Flomax.  He also complains today of central weight gain.  He is gained 22 pounds over the last couple of months.  He has had severe problems with back pain and failed injections.  As such, he has been less physically active and eating more cookies.    PMH: Past Medical History:  Diagnosis Date  . Anxiety   . Arthritis    back,wrists,hx. previous fractures  . BPH (benign prostatic hypertrophy)   . CAD (coronary artery disease)    angioplasty   . Chronic back pain   . Chronic bronchitis   . Depression   . GERD (gastroesophageal reflux disease)   . H/O hiatal hernia    pts wife states pt does not have   . Heart murmur   . Hypercholesteremia   . Hypertension   . MI (myocardial infarction) ([place])    mid-'90's  . Peripheral neuropathy    peripheral neuropathy  .  Pneumonia   . Prostate cancer ([place])   . Sleep apnea    mild-no cpap, unable to tolerate  . Umbilical hernia   . Varicose veins     Surgical History: Past Surgical History:  Procedure Laterality Date  . ANGIOPLASTY    . APPENDECTOMY    . BACK SURGERY     x3  . CARDIAC CATHETERIZATION     11'11  . CIRCUMCISION N/A 10/21/2013   Procedure: CIRCUMCISION ADULT;  Surgeon: Bernestine Amass, MD;  [facility];  Service: Urology;  Laterality: N/A;  . CYSTOSCOPY N/A 10/21/2013   Procedure: CYSTOSCOPY FLEXIBLE;  Surgeon: Bernestine Amass, MD;  [facility];  Service: Urology;  Laterality: N/A;  . INSERTION OF MESH N/A 06/17/2016   Procedure: INSERTION OF MESH;  Surgeon: Excell Seltzer, MD;  [facility];  Service: General;  Laterality: N/A;  . UMBILICAL HERNIA REPAIR N/A 06/17/2016   Procedure: REPAIR UMBILICAL HERNIA WITH MESH;  Surgeon: Excell Seltzer, MD;  [facility];  Service: General;  Laterality: N/A;  . WRIST SURGERY Right     Home Medications:  Allergies as of 01/23/2018      Reactions   Seroquel [quetiapine] Hives      Medication List  Accurate as of 01/23/18 11:59 PM. Always use your most recent med list.          albuterol 108 (90 Base) MCG/ACT inhaler Commonly known as:  PROVENTIL HFA;VENTOLIN HFA Inhale 1-2 puffs into the lungs every 6 (six) hours as needed for wheezing or shortness of breath.   ALPRAZolam 1 MG tablet Commonly known as:  XANAX Take 1 tablet (1 mg total) by mouth 2 (two) times daily as needed for anxiety.   aspirin 81 MG tablet Take 81 mg by mouth daily.   budesonide-formoterol 160-4.5 MCG/ACT inhaler Commonly known as:  SYMBICORT Inhale 2 puffs into the lungs 2 (two) times daily.   DULoxetine 30 MG capsule Commonly known as:  CYMBALTA Take 30 mg by mouth daily.   furosemide 40 MG tablet Commonly known as:  LASIX Take1 tablet on day one(40 mg)Then take1 and1/2 tab on day two(60mg )and then take two tablets on  daythree(80mg ).Then go back to day one   methadone 10 MG tablet Commonly known as:  DOLOPHINE Take 10 mg by mouth 4 (four) times daily.   oxyCODONE-acetaminophen 10-325 MG tablet Commonly known as:  PERCOCET Take 1 tablet by mouth every 4 (four) hours as needed for pain.   potassium chloride 10 MEQ tablet Commonly known as:  KLOR-CON 10 2 tabs by mouth per day on days taking furosemide   simvastatin 80 MG tablet Commonly known as:  ZOCOR Take 1 tablet (80 mg total) by mouth every evening. Must keep scheduled appt for future refills   tamsulosin 0.4 MG Caps capsule Commonly known as:  FLOMAX Take 1 capsule (0.4 mg total) by mouth daily.   traZODone 50 MG tablet Commonly known as:  DESYREL TAKE ONE-HALF TO ONE TABLET BY MOUTH AT BEDTIME AS NEEDED FOR SLEEP       Allergies:  Allergies  Allergen Reactions  . Seroquel [Quetiapine] Hives    Family History: Family History  Problem Relation Age of Onset  . Prostate cancer Father   . Emphysema Father   . Hyperlipidemia Father   . Hyperlipidemia Mother     Social History:  reports that he quit smoking about 22 years ago. His smoking use included cigarettes. He has a 20.00 pack-year smoking history. he has never used smokeless tobacco. He reports that he does not drink alcohol or use drugs.  ROS: UROLOGY Frequent Urination?: Yes Hard to postpone urination?: No Burning/pain with urination?: No Get up at night to urinate?: Yes Leakage of urine?: Yes Urine stream starts and stops?: No Trouble starting stream?: No Do you have to strain to urinate?: No Blood in urine?: No Urinary tract infection?: No Sexually transmitted disease?: No Injury to kidneys or bladder?: No Painful intercourse?: No Weak stream?: No Erection problems?: No Penile pain?: No  Gastrointestinal Nausea?: No Vomiting?: No Indigestion/heartburn?: No Diarrhea?: No Constipation?: Yes  Constitutional Fever: No Night sweats?: No Weight loss?:  No Fatigue?: No  Skin Skin rash/lesions?: No Itching?: Yes  Eyes Blurred vision?: No Double vision?: No  Ears/Nose/Throat Sore throat?: No Sinus problems?: No  Hematologic/Lymphatic Swollen glands?: No Easy bruising?: No  Cardiovascular Leg swelling?: Yes Chest pain?: No  Respiratory Cough?: No Shortness of breath?: No  Endocrine Excessive thirst?: Yes  Musculoskeletal Back pain?: Yes Joint pain?: Yes  Neurological Headaches?: Yes Dizziness?: Yes  Psychologic Depression?: Yes Anxiety?: No  Physical Exam: BP (!) 143/67 (BP Location: Left Arm, Patient Position: Sitting, Cuff Size: Large)   Pulse 78   Ht 5\' 10"  (1.778 m)  Wt 221 lb 3.2 oz (100.3 kg)   BMI 31.74 kg/m   Constitutional:  Alert and oriented, No acute distress.  Accompanied by wife today. HEENT: [place] AT, moist mucus membranes.  Trachea midline, no masses. Cardiovascular: No clubbing, cyanosis, or edema. Respiratory: Normal respiratory effort, no increased work of breathing. GI: Abdomen is soft, nontender, nondistended, no abdominal masses, obese.  Skin: No rashes, bruises or suspicious lesions. Neurologic: Grossly intact, no focal deficits, moving all 4 extremities. Psychiatric: Normal mood and affect.  Laboratory Data: Lab Results  Component Value Date   WBC 5.1 03/16/2017   HGB 11.7 (L) 03/16/2017   HCT 33.6 (L) 03/16/2017   MCV 89.0 03/16/2017   PLT 170 03/16/2017    Lab Results  Component Value Date   CREATININE 0.76 12/05/2016    Lab Results  Component Value Date   PSA1 <0.1 01/19/2018   PSA1 17.6 (H) 11/11/2016   Lab Results  Component Value Date   HGBA1C 6.0 05/19/2016    Urinalysis N/a  Pertinent Imaging: Results for orders placed or performed in visit on 01/23/18  Microscopic Examination  Result Value Ref Range   WBC, UA 0-5 0 - 5 /hpf   RBC, UA 0-2 0 - 2 /hpf   Epithelial Cells (non renal) 0-10 0 - 10 /hpf   Crystals Present (A) N/A   Crystal Type Calcium  Oxalate N/A   Mucus, UA Present (A) Not Estab.   Bacteria, UA Few (A) None seen/Few  Urinalysis, Complete  Result Value Ref Range   Specific Gravity, UA 1.020 1.005 - 1.030   pH, UA 5.5 5.0 - 7.5   Color, UA Yellow Yellow   Appearance Ur Clear Clear   Leukocytes, UA Negative Negative   Protein, UA Negative Negative/Trace   Glucose, UA Negative Negative   Ketones, UA Trace Negative   RBC, UA Negative Negative   Bilirubin, UA Negative Negative   Urobilinogen, Ur 0.2 0.2 - 1.0 mg/dL   Nitrite, UA Negative Negative   Microscopic Examination See below:   BLADDER SCAN AMB NON-IMAGING  Result Value Ref Range   Scan Result 1ml      Assessment & Plan:    1. Prostate cancer ([place]) Prostate cancer status post IM RT Continue to recommend Lupron for 2-3 years, overdue for injection Lupron x 6 mo given today Continue to encourage weightbearing exercise and weight loss  2. Urinary frequency Increased urinary frequency Behavioral modification reviewed Continue Flomax Samples of Myrbetriq 25 mg given today x 4 weeks- will call if effective No evidence of incomplete bladder emptying or UTIs contributing factor - Urinalysis, Complete - BLADDER SCAN AMB NON-IMAGING - Leuprolide Acetate (6 Month) (LUPRON) injection 45 mg - Urinalysis, Complete - BLADDER SCAN AMB NON-IMAGING   Return in about 6 months (around 07/23/2018) for Lupron, PSA prior, IPSS, PVR.  Hollice Espy, MD  [facility] [address] [place], [place] [address] [phone]  I spent 25 min with this patient of which greater than 50% was spent in counseling and coordination of care with the patient.

## 2018-01-24 IMAGING — NM NM BONE WHOLE BODY
4 series · 12 of 12 positions shown · non-contrast
Comparison: CT 12/21/2016.

CLINICAL DATA: Prostate cancer.

EXAM:
NUCLEAR MEDICINE WHOLE BODY BONE SCAN
TECHNIQUE: Whole body anterior and posterior images were obtained approximately
3 hours after intravenous injection of radiopharmaceutical.
RADIOPHARMACEUTICALS:  21.0 mCi 0echnetium-PPm MDP IV

[Series 1000: 3 hr wholebody (reformatted series) · 2.40mm/px · 2 of 2 frames shown]
[frame 1/2]
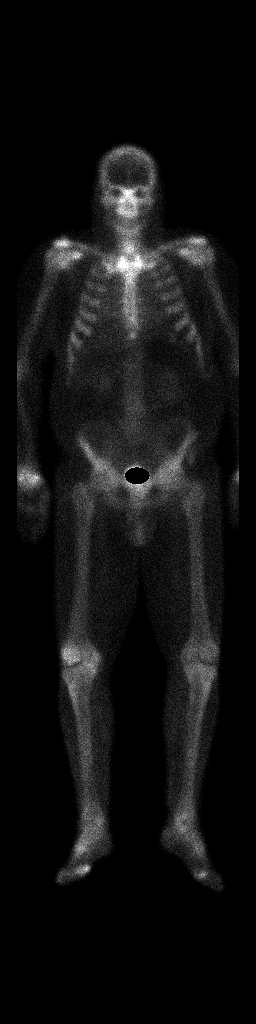
[frame 2/2]
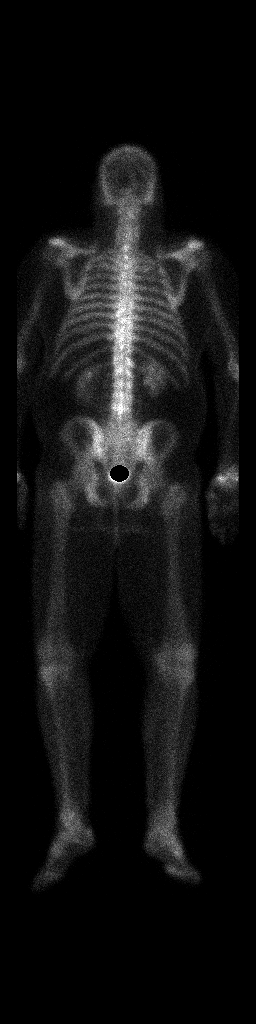

[Series 1000: 3 hr wholebody · 2.40mm/px · 2 of 2 frames shown]
[frame 1/2]
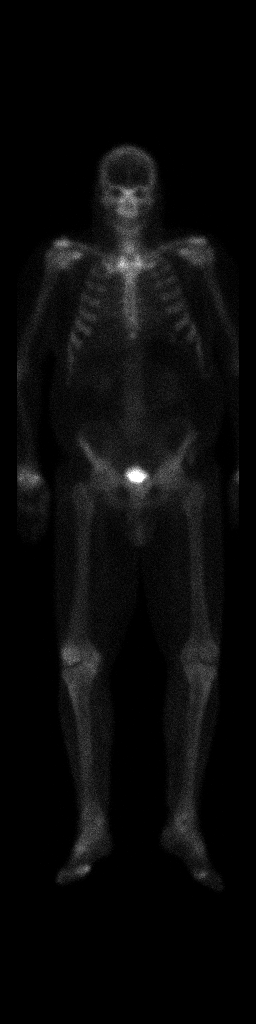
[frame 2/2]
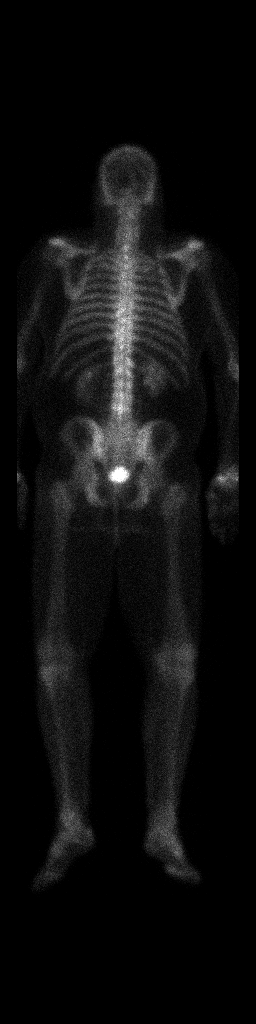

[Series 1000: statics (reformatted series) · 2.40mm/px · 2 acquisitions, 4 frames shown]
[im 1/2]
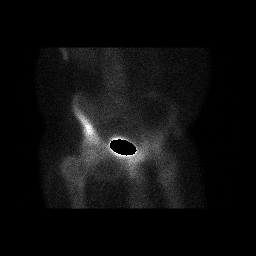
[im 1/2]
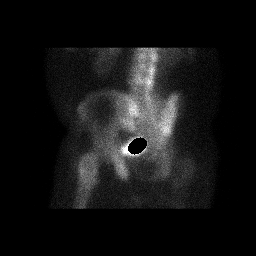
[im 2/2]
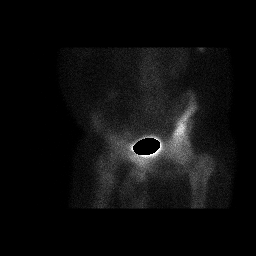
[im 2/2]
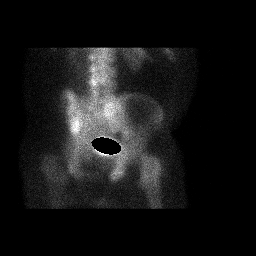

[Series 1000: statics · 2.40mm/px · 2 acquisitions, 4 frames shown]
[im 1/2]
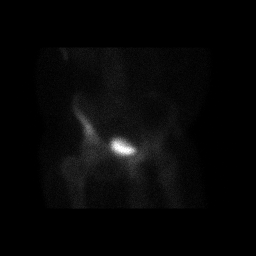
[im 1/2]
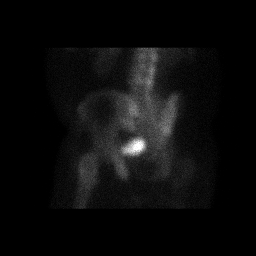
[im 2/2]
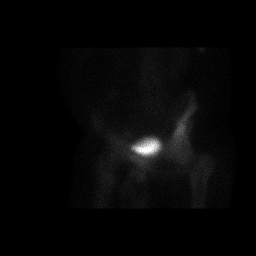
[im 2/2]
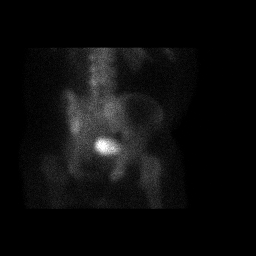

[12 of 12 positions shown; findings below may reference images not displayed]

FINDINGS: Bilateral renal function excretion. No focal bony abnormalities
noted to suggest metastatic disease. Areas of increased activity
noted about the shoulders, sternoclavicular joints, knees, ankles,
and feet consistent with degenerative change. Mild diffuse
thoracolumbar spine areas of increased activity most consistent with
degenerative changes noted on prior CT of 12/21/2016. No focal area
or areas of intense activity noted in the spine.
IMPRESSION: 1. Findings consistent multifocal degenerative change. Activity
noted throughout the spine, most likely degenerative.

2.   No evidence of metastatic disease otherwise noted.

## 2018-01-25 ENCOUNTER — Encounter: Payer: Self-pay | Admitting: Urology

## 2018-02-07 ENCOUNTER — Ambulatory Visit (INDEPENDENT_AMBULATORY_CARE_PROVIDER_SITE_OTHER): Payer: Medicare Other | Admitting: Internal Medicine

## 2018-02-07 ENCOUNTER — Telehealth: Payer: Self-pay | Admitting: Internal Medicine

## 2018-02-07 ENCOUNTER — Other Ambulatory Visit (INDEPENDENT_AMBULATORY_CARE_PROVIDER_SITE_OTHER): Payer: Medicare Other

## 2018-02-07 ENCOUNTER — Encounter: Payer: Self-pay | Admitting: Internal Medicine

## 2018-02-07 VITALS — BP 136/84 | HR 65 | Temp 97.6°F | Ht 70.0 in | Wt 217.0 lb

## 2018-02-07 DIAGNOSIS — R7302 Impaired glucose tolerance (oral): Secondary | ICD-10-CM

## 2018-02-07 DIAGNOSIS — Z0001 Encounter for general adult medical examination with abnormal findings: Secondary | ICD-10-CM

## 2018-02-07 DIAGNOSIS — J069 Acute upper respiratory infection, unspecified: Secondary | ICD-10-CM

## 2018-02-07 DIAGNOSIS — R739 Hyperglycemia, unspecified: Secondary | ICD-10-CM | POA: Diagnosis not present

## 2018-02-07 LAB — CBC WITH DIFFERENTIAL/PLATELET
Basophils Absolute: 0 10*3/uL (ref 0.0–0.1)
Basophils Relative: 0.5 % (ref 0.0–3.0)
Eosinophils Absolute: 0.1 10*3/uL (ref 0.0–0.7)
Eosinophils Relative: 3.1 % (ref 0.0–5.0)
HCT: 33.5 % — ABNORMAL LOW (ref 39.0–52.0)
Hemoglobin: 11.5 g/dL — ABNORMAL LOW (ref 13.0–17.0)
Lymphocytes Relative: 25.6 % (ref 12.0–46.0)
Lymphs Abs: 1.2 10*3/uL (ref 0.7–4.0)
MCHC: 34.3 g/dL (ref 30.0–36.0)
MCV: 89.4 fl (ref 78.0–100.0)
Monocytes Absolute: 0.5 10*3/uL (ref 0.1–1.0)
Monocytes Relative: 11.5 % (ref 3.0–12.0)
Neutro Abs: 2.8 10*3/uL (ref 1.4–7.7)
Neutrophils Relative %: 59.3 % (ref 43.0–77.0)
Platelets: 180 10*3/uL (ref 150.0–400.0)
RBC: 3.75 Mil/uL — ABNORMAL LOW (ref 4.22–5.81)
RDW: 12.9 % (ref 11.5–15.5)
WBC: 4.8 10*3/uL (ref 4.0–10.5)

## 2018-02-07 LAB — BASIC METABOLIC PANEL WITH GFR
BUN: 11 mg/dL (ref 6–23)
CO2: 36 meq/L — ABNORMAL HIGH (ref 19–32)
Calcium: 9 mg/dL (ref 8.4–10.5)
Chloride: 101 meq/L (ref 96–112)
Creatinine, Ser: 0.92 mg/dL (ref 0.40–1.50)
GFR: 85.96 mL/min
Glucose, Bld: 114 mg/dL — ABNORMAL HIGH (ref 70–99)
Potassium: 3.7 meq/L (ref 3.5–5.1)
Sodium: 143 meq/L (ref 135–145)

## 2018-02-07 LAB — LIPID PANEL
Cholesterol: 98 mg/dL (ref 0–200)
HDL: 30.5 mg/dL — ABNORMAL LOW
LDL Cholesterol: 35 mg/dL (ref 0–99)
NonHDL: 67.78
Total CHOL/HDL Ratio: 3
Triglycerides: 165 mg/dL — ABNORMAL HIGH (ref 0.0–149.0)
VLDL: 33 mg/dL (ref 0.0–40.0)

## 2018-02-07 LAB — URINALYSIS, ROUTINE W REFLEX MICROSCOPIC
Bilirubin Urine: NEGATIVE
Hgb urine dipstick: NEGATIVE
Ketones, ur: NEGATIVE
Leukocytes, UA: NEGATIVE
Nitrite: NEGATIVE
RBC / HPF: NONE SEEN
Specific Gravity, Urine: 1.01
Total Protein, Urine: NEGATIVE
Urine Glucose: NEGATIVE
Urobilinogen, UA: 0.2
pH: 6.5 (ref 5.0–8.0)

## 2018-02-07 LAB — HEPATIC FUNCTION PANEL
ALT: 14 U/L (ref 0–53)
AST: 14 U/L (ref 0–37)
Albumin: 3.6 g/dL (ref 3.5–5.2)
Alkaline Phosphatase: 79 U/L (ref 39–117)
Bilirubin, Direct: 0.1 mg/dL (ref 0.0–0.3)
Total Bilirubin: 0.3 mg/dL (ref 0.2–1.2)
Total Protein: 6.6 g/dL (ref 6.0–8.3)

## 2018-02-07 LAB — HEMOGLOBIN A1C: Hgb A1c MFr Bld: 6.1 % (ref 4.6–6.5)

## 2018-02-07 LAB — TSH: TSH: 3.74 u[IU]/mL (ref 0.35–4.50)

## 2018-02-07 MED ORDER — SIMVASTATIN 80 MG PO TABS: 80.0000 mg | ORAL_TABLET | Freq: Every evening | ORAL | 3 refills | Status: DC

## 2018-02-07 MED ORDER — AZITHROMYCIN 250 MG PO TABS: ORAL_TABLET | ORAL | 1 refills | Status: DC

## 2018-02-07 MED ORDER — TRAZODONE HCL 50 MG PO TABS: ORAL_TABLET | ORAL | 1 refills | Status: DC

## 2018-02-07 NOTE — Assessment & Plan Note (Signed)
stable overall by history and exam, recent data reviewed with pt, and pt to continue medical treatment as before,  to f/u any worsening symptoms or concerns Lab Results  Component Value Date   HGBA1C 6.0 05/19/2016

## 2018-02-07 NOTE — Telephone Encounter (Signed)
Copied from [place]. Topic: Quick Communication - Rx Refill/Question >> Feb 07, 2018 11:33 AM Arletha Grippe wrote: Medication:  azithromycin (ZITHROMAX Z-PAK) 250 MG tablet  Has the patient contacted their pharmacy? Yes.    [place], [place] - [place] [phone] (  (Agent: If no, request that the patient contact the pharmacy for the refill.)   Preferred Pharmacy (with phone number or street name): [facility] - pt was given zpack - this can cause qt issues combined with his other meds.  Do you want to still fill this or change to something else? [phone]   Agent: Please be advised that RX refills may take up to 3 business days. We ask that you follow-up with your pharmacy.

## 2018-02-07 NOTE — Telephone Encounter (Signed)
Ok to take this time, thanks 

## 2018-02-07 NOTE — Patient Instructions (Addendum)
Please take all new medication as prescribed - the antibiotic  Please continue all other medications as before, and refills have been done if requested.  Please have the pharmacy call with any other refills you may need.  Please continue your efforts at being more active, low cholesterol diet, and weight control.  You are otherwise up to date with prevention measures today.  Please keep your appointments with your specialists as you may have planned  Please go to the LAB in the Basement (turn left off the elevator) for the tests to be done today  You will be contacted by phone if any changes need to be made immediately.  Otherwise, you will receive a letter about your results with an explanation, but please check with MyChart first.  Please remember to sign up for MyChart if you have not done so, as this will be important to you in the future with finding out test results, communicating by private email, and scheduling acute appointments online when needed.  Please return in 1 year for your yearly visit, or sooner if needed, with Lab testing done 3-5 days before  

## 2018-02-07 NOTE — Telephone Encounter (Signed)
Pharmacy has been informed.

## 2018-02-07 NOTE — Assessment & Plan Note (Signed)
With likely assoc large right neck LA, now Mild to mod, for antibx course,  to f/u any worsening symptoms or concerns

## 2018-02-07 NOTE — Assessment & Plan Note (Signed)

## 2018-02-07 NOTE — Progress Notes (Signed)
Subjective:    Patient ID: Albert Wade, male    DOB: 05/27/1946, 72 y.o.   [name]: [id number]  HPI /Here for wellness and f/u;  Overall doing ok;  Pt denies Chest pain, worsening SOB, DOE, wheezing, orthopnea, PND, worsening LE edema, palpitations, dizziness or syncope.  Pt denies neurological change such as new headache, facial or extremity weakness.  Pt denies polydipsia, polyuria, or low sugar symptoms. Pt states overall good compliance with treatment and medications, good tolerability, and has been trying to follow appropriate diet.  Pt denies worsening depressive symptoms, suicidal ideation or panic. No fever, night sweats, wt loss, loss of appetite, or other constitutional symptoms.  Pt states good ability with ADL's, has low fall risk, home safety reviewed and adequate, no other significant changes in hearing or vision, and only occasionally active with exercise.  Recent PSA < 0.1 Also c/o 3 days ST with fever and right neck pain, swelling near the angle of the jaw, seems to be getting bigger and smaller, mobile and pain now mild 3/10.  Pt continues to have recurring LBP without change in severity, bowel or bladder change, fever, wt loss,  worsening LE pain/numbness/weakness, gait change or falls. Past Medical History:  Diagnosis Date  . Anxiety   . Arthritis    back,wrists,hx. previous fractures  . BPH (benign prostatic hypertrophy)   . CAD (coronary artery disease)    angioplasty   . Chronic back pain   . Chronic bronchitis   . Depression   . GERD (gastroesophageal reflux disease)   . H/O hiatal hernia    pts wife states pt does not have   . Heart murmur   . Hypercholesteremia   . Hypertension   . MI (myocardial infarction) ([place])    mid-'90's  . Peripheral neuropathy    peripheral neuropathy  . Pneumonia   . Prostate cancer ([place])   . Sleep apnea    mild-no cpap, unable to tolerate  . Umbilical hernia   . Varicose veins    Past Surgical History:  Procedure Laterality  Date  . ANGIOPLASTY    . APPENDECTOMY    . BACK SURGERY     x3  . CARDIAC CATHETERIZATION     11'11  . CIRCUMCISION N/A 10/21/2013   Procedure: CIRCUMCISION ADULT;  Surgeon: Bernestine Amass, MD;  Location: [facility];  Service: Urology;  Laterality: N/A;  . CYSTOSCOPY N/A 10/21/2013   Procedure: CYSTOSCOPY FLEXIBLE;  Surgeon: Bernestine Amass, MD;  Location: [facility];  Service: Urology;  Laterality: N/A;  . INSERTION OF MESH N/A 06/17/2016   Procedure: INSERTION OF MESH;  Surgeon: Excell Seltzer, MD;  Location: [facility];  Service: General;  Laterality: N/A;  . UMBILICAL HERNIA REPAIR N/A 06/17/2016   Procedure: REPAIR UMBILICAL HERNIA WITH MESH;  Surgeon: Excell Seltzer, MD;  Location: [facility];  Service: General;  Laterality: N/A;  . WRIST SURGERY Right     reports that he quit smoking about 22 years ago. His smoking use included cigarettes. He has a 20.00 pack-year smoking history. he has never used smokeless tobacco. He reports that he does not drink alcohol or use drugs. family history includes Emphysema in his father; Hyperlipidemia in his father and mother; Prostate cancer in his father. Allergies  Allergen Reactions  . Seroquel [Quetiapine] Hives   Current Outpatient Medications on File Prior to Visit  Medication Sig Dispense Refill  . albuterol (PROVENTIL HFA;VENTOLIN HFA) 108 (90 Base) MCG/ACT inhaler Inhale 1-2 puffs into the lungs  every 6 (six) hours as needed for wheezing or shortness of breath. 3 Inhaler 3  . ALPRAZolam (XANAX) 1 MG tablet Take 1 tablet (1 mg total) by mouth 2 (two) times daily as needed for anxiety. 30 tablet 2  . aspirin 81 MG tablet Take 81 mg by mouth daily.    . budesonide-formoterol (SYMBICORT) 160-4.5 MCG/ACT inhaler Inhale 2 puffs into the lungs 2 (two) times daily.    . DULoxetine (CYMBALTA) 30 MG capsule Take 30 mg by mouth daily.    . furosemide (LASIX) 40 MG tablet Take1 tablet on day one(40 mg)Then take1 and1/2 tab on day two(60mg )and then take two  tablets on daythree(80mg ).Then go back to day one 165 tablet 5  . methadone (DOLOPHINE) 10 MG tablet Take 10 mg by mouth 4 (four) times daily.     Marland Kitchen oxyCODONE-acetaminophen (PERCOCET) 10-325 MG tablet Take 1 tablet by mouth every 4 (four) hours as needed for pain. 30 tablet 0  . potassium chloride (KLOR-CON 10) 10 MEQ tablet 2 tabs by mouth per day on days taking furosemide 180 tablet 3  . tamsulosin (FLOMAX) 0.4 MG CAPS capsule Take 1 capsule (0.4 mg total) by mouth daily. 30 capsule 11   No current facility-administered medications on file prior to visit.     Review of Systems Constitutional: Negative for other unusual diaphoresis, sweats, appetite or weight changes HENT: Negative for other worsening hearing loss, ear pain, facial swelling, mouth sores or neck stiffness.   Eyes: Negative for other worsening pain, redness or other visual disturbance.  Respiratory: Negative for other stridor or swelling Cardiovascular: Negative for other palpitations or other chest pain  Gastrointestinal: Negative for worsening diarrhea or loose stools, blood in stool, distention or other pain Genitourinary: Negative for hematuria, flank pain or other change in urine volume.  Musculoskeletal: Negative for myalgias or other joint swelling.  Skin: Negative for other color change, or other wound or worsening drainage.  Neurological: Negative for other syncope or numbness. Hematological: Negative for other adenopathy or swelling Psychiatric/Behavioral: Negative for hallucinations, other worsening agitation, SI, self-injury, or new decreased concentration All other system neg per pt    Objective:   Physical Exam BP 136/84   Pulse 65   Temp 97.6 F (36.4 C) (Oral)   Ht 5\' 10"  (1.778 m)   Wt 217 lb (98.4 kg)   SpO2 98%   BMI 31.14 kg/m  VS noted, obese, mild ill Constitutional: Pt is oriented to person, place, and time. Appears well-developed and well-nourished, in no significant distress and  comfortable Head: Normocephalic and atraumatic  Eyes: Conjunctivae and EOM are normal. Pupils are equal, round, and reactive to light Right Ear: External ear normal without discharge Left Ear: External ear normal without discharge Nose: Nose without discharge or deformity Bilat tm's with mild erythema.  Max sinus areas mild tender.  Pharynx with mild erythema, no exudate Mouth/Throat: Oropharynx is without other ulcerations and moist  Neck: Normal range of motion. Neck supple. No JVD present. No tracheal deviation present or significant neck LA or mass except for large 1.5- 2 cm subq nodular mobile tender mass near angle of jaw Cardiovascular: Normal rate, regular rhythm, normal heart sounds and intact distal pulses.   Pulmonary/Chest: WOB normal and breath sounds without rales or wheezing  Abdominal: Soft. Bowel sounds are normal. NT. No HSM  Musculoskeletal: Normal range of motion. Exhibits no edema Lymphadenopathy: Has no other cervical adenopathy.  Neurological: Pt is alert and oriented to person, place, and time. Pt  has normal reflexes. No cranial nerve deficit. Motor grossly intact, Gait intact Skin: Skin is warm and dry. No rash noted or new ulcerations Psychiatric:  Has normal mood and affect. Behavior is normal without agitation No other exam findings Lab Results  Component Value Date   WBC 5.1 03/16/2017   HGB 11.7 (L) 03/16/2017   HCT 33.6 (L) 03/16/2017   PLT 170 03/16/2017   GLUCOSE 115 (H) 12/02/2016   CHOL 187 05/19/2016   TRIG (H) 05/19/2016    426.0 Triglyceride is over 400; calculations on Lipids are invalid.   HDL 28.30 (L) 05/19/2016   LDLDIRECT 78.0 05/19/2016   LDLCALC 41 06/01/2015   ALT 18 12/01/2016   AST 31 12/01/2016   NA 136 12/02/2016   K 3.6 12/02/2016   CL 103 12/02/2016   CREATININE 0.76 12/05/2016   BUN 9 12/02/2016   CO2 28 12/02/2016   TSH 2.75 05/19/2016   PSA 10.73 (H) 05/19/2016   HGBA1C 6.0 05/19/2016   Last PSA per [facility]  < 0.1 in Nov 2018     Assessment & Plan:

## 2018-03-29 ENCOUNTER — Inpatient Hospital Stay: Payer: Medicare Other | Attending: Radiation Oncology

## 2018-03-29 DIAGNOSIS — C61 Malignant neoplasm of prostate: Secondary | ICD-10-CM | POA: Diagnosis present

## 2018-03-29 LAB — PSA: Prostatic Specific Antigen: 0.01 ng/mL (ref 0.00–4.00)

## 2018-03-30 DIAGNOSIS — G894 Chronic pain syndrome: Secondary | ICD-10-CM | POA: Diagnosis not present

## 2018-03-30 DIAGNOSIS — M48061 Spinal stenosis, lumbar region without neurogenic claudication: Secondary | ICD-10-CM | POA: Diagnosis not present

## 2018-03-30 DIAGNOSIS — M5416 Radiculopathy, lumbar region: Secondary | ICD-10-CM | POA: Diagnosis not present

## 2018-04-05 ENCOUNTER — Ambulatory Visit
Admission: RE | Admit: 2018-04-05 | Discharge: 2018-04-05 | Disposition: A | Discharge status: Home / Self Care | Payer: Medicare Other | Source: Ambulatory Visit | Attending: Radiation Oncology | Admitting: Radiation Oncology

## 2018-04-05 ENCOUNTER — Encounter: Payer: Self-pay | Admitting: Radiation Oncology

## 2018-04-05 ENCOUNTER — Other Ambulatory Visit: Payer: Self-pay

## 2018-04-05 VITALS — BP 155/71 | HR 73 | Temp 97.8°F | Resp 20 | Wt 221.6 lb

## 2018-04-05 DIAGNOSIS — R350 Frequency of micturition: Secondary | ICD-10-CM | POA: Insufficient documentation

## 2018-04-05 DIAGNOSIS — N393 Stress incontinence (female) (male): Secondary | ICD-10-CM | POA: Diagnosis not present

## 2018-04-05 DIAGNOSIS — C61 Malignant neoplasm of prostate: Secondary | ICD-10-CM

## 2018-04-05 DIAGNOSIS — Z923 Personal history of irradiation: Secondary | ICD-10-CM | POA: Insufficient documentation

## 2018-04-05 NOTE — Progress Notes (Signed)
Radiation Oncology Follow up Note  Name: Albert Wade   Date:   04/05/2018 [name]:  [id number] DOB: 11-01-1946    This 72 y.o. male presents to the clinic today for11 month follow-up status post I MRT radiation therapy for stage IIB adenocarcinoma the prostate.  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 He is seen today in routine follow-upand is doing well his most recent PSA is 0.01 this month. He continues on Lupron suppression. He is having some increased frequency urgency and some stress incontinence. He is seen urology about that..  COMPLICATIONS OF TREATMENT: none  FOLLOW UP COMPLIANCE: keeps appointments   PHYSICAL EXAM:  BP (!) 155/71   Pulse 73   Temp 97.8 F (36.6 C)   Resp 20   Wt 221 lb 9 oz (100.5 kg)   BMI 31.79 kg/m  Well-developed well-nourished patient in NAD. HEENT reveals PERLA, EOMI, discs not visualized.  Oral cavity is clear. No oral mucosal lesions are identified. Neck is clear without evidence of cervical or supraclavicular adenopathy. Lungs are clear to A&P. Cardiac examination is essentially unremarkable with regular rate and rhythm without murmur rub or thrill. Abdomen is benign with no organomegaly or masses noted. Motor sensory and DTR levels are equal and symmetric in the upper and lower extremities. Cranial nerves II through XII are grossly intact. Proprioception is intact. No peripheral adenopathy or edema is identified. No motor or sensory levels are noted. Crude visual fields are within normal range.  RADIOLOGY RESULTS: no current films for review  PLAN: present time is under excellent biochemical control of his prostate cancer. He continues on ADT therapy by urology. He is also will have his urinary symptoms addressed by urology in the next couple of weeks with a follow-up  appointment. I have asked to see him back in 1 year for follow-up. Patient is to call sooner with any concerns.  I would like to take this opportunity to thank you for allowing me to participate in the care of your patient.Noreene Filbert, MD

## 2018-04-06 IMAGING — CR DG CHEST 2V
1 series · 2 of 2 positions shown · non-contrast
Comparison: 12/04/2016

CLINICAL DATA: Trip and fall over dog with right-sided chest pain,
initial encounter

EXAM:
CHEST  2 VIEW

[Series 1: dg chest 2 view · 0.14mm/px · 2 of 2 slices shown]
[im 1/2]
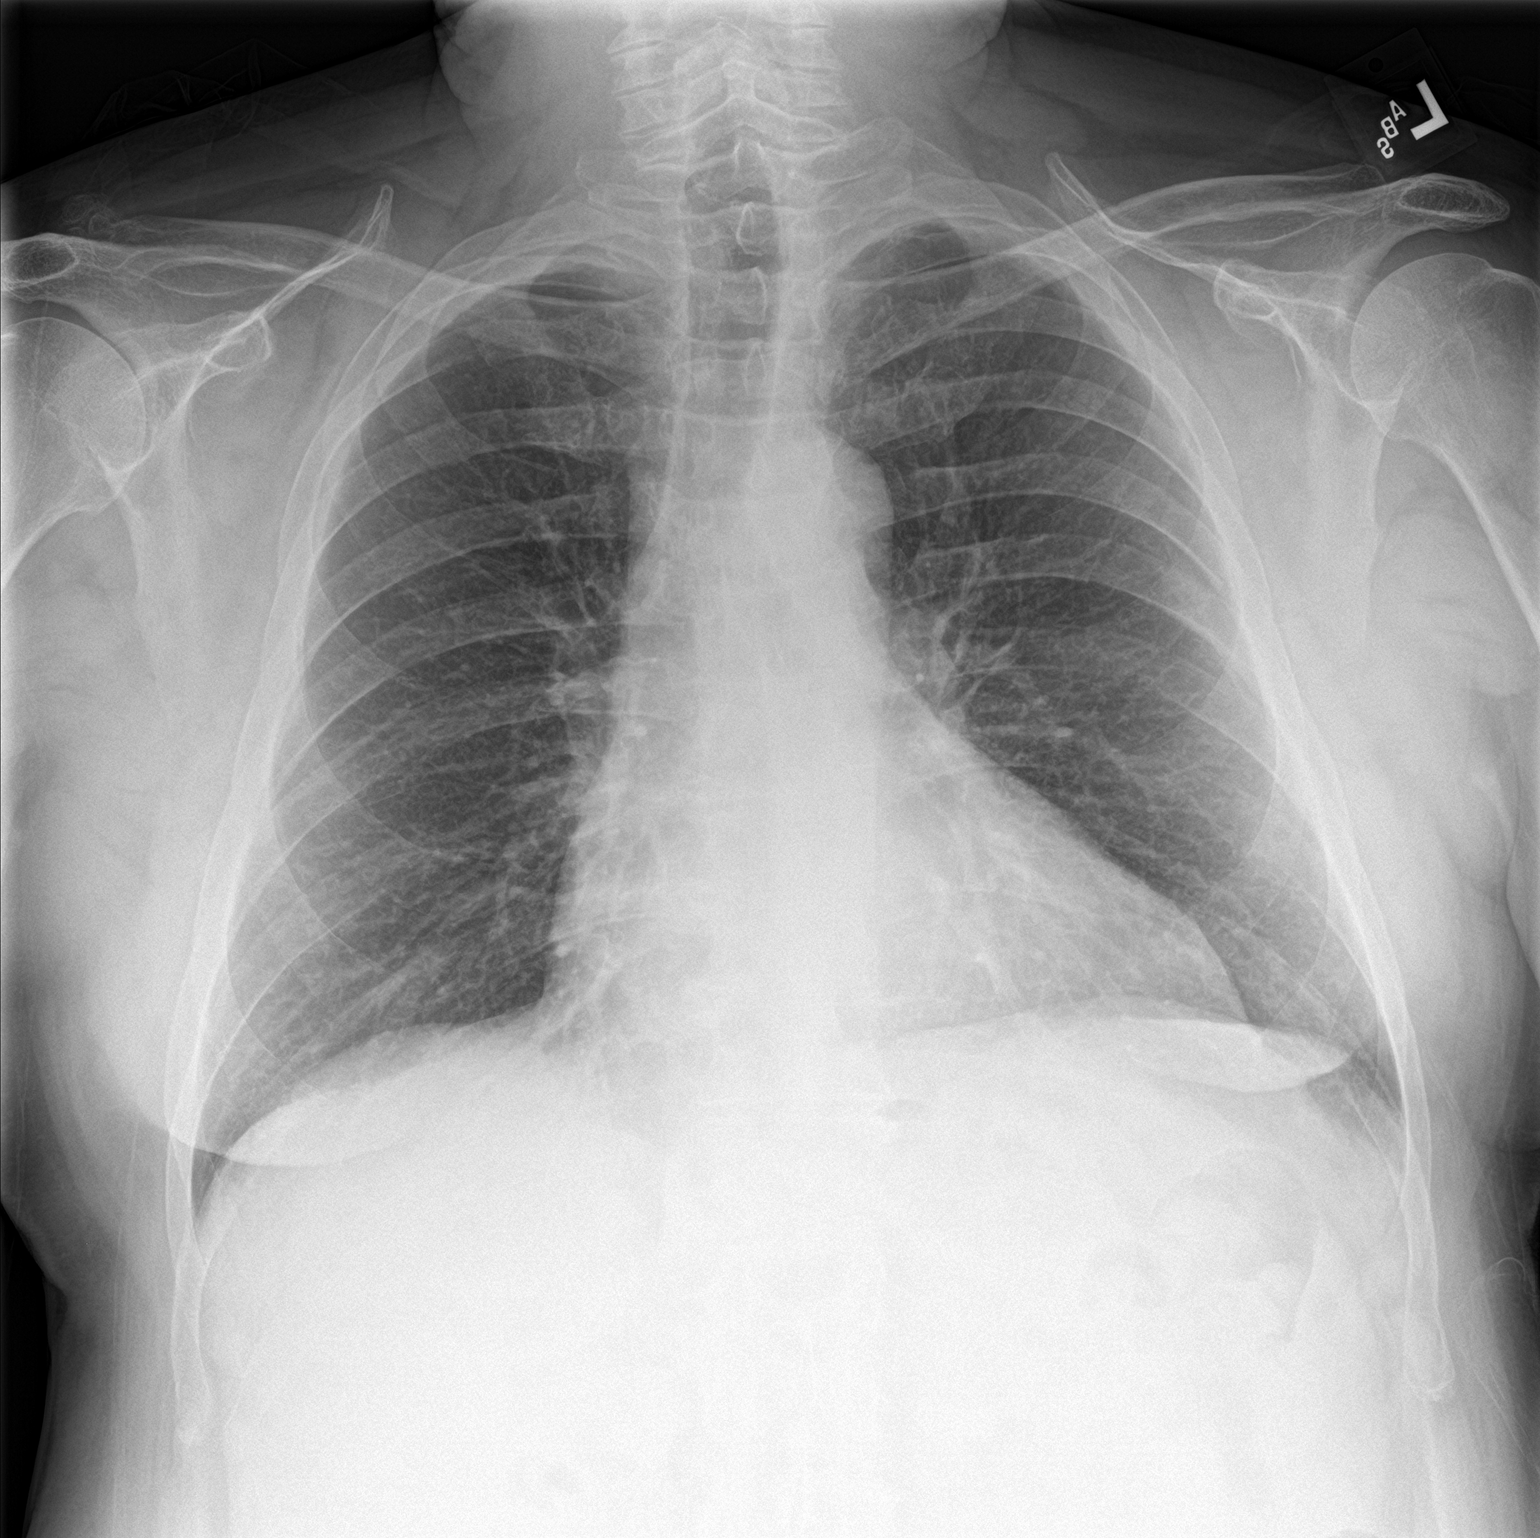
[im 2/2]
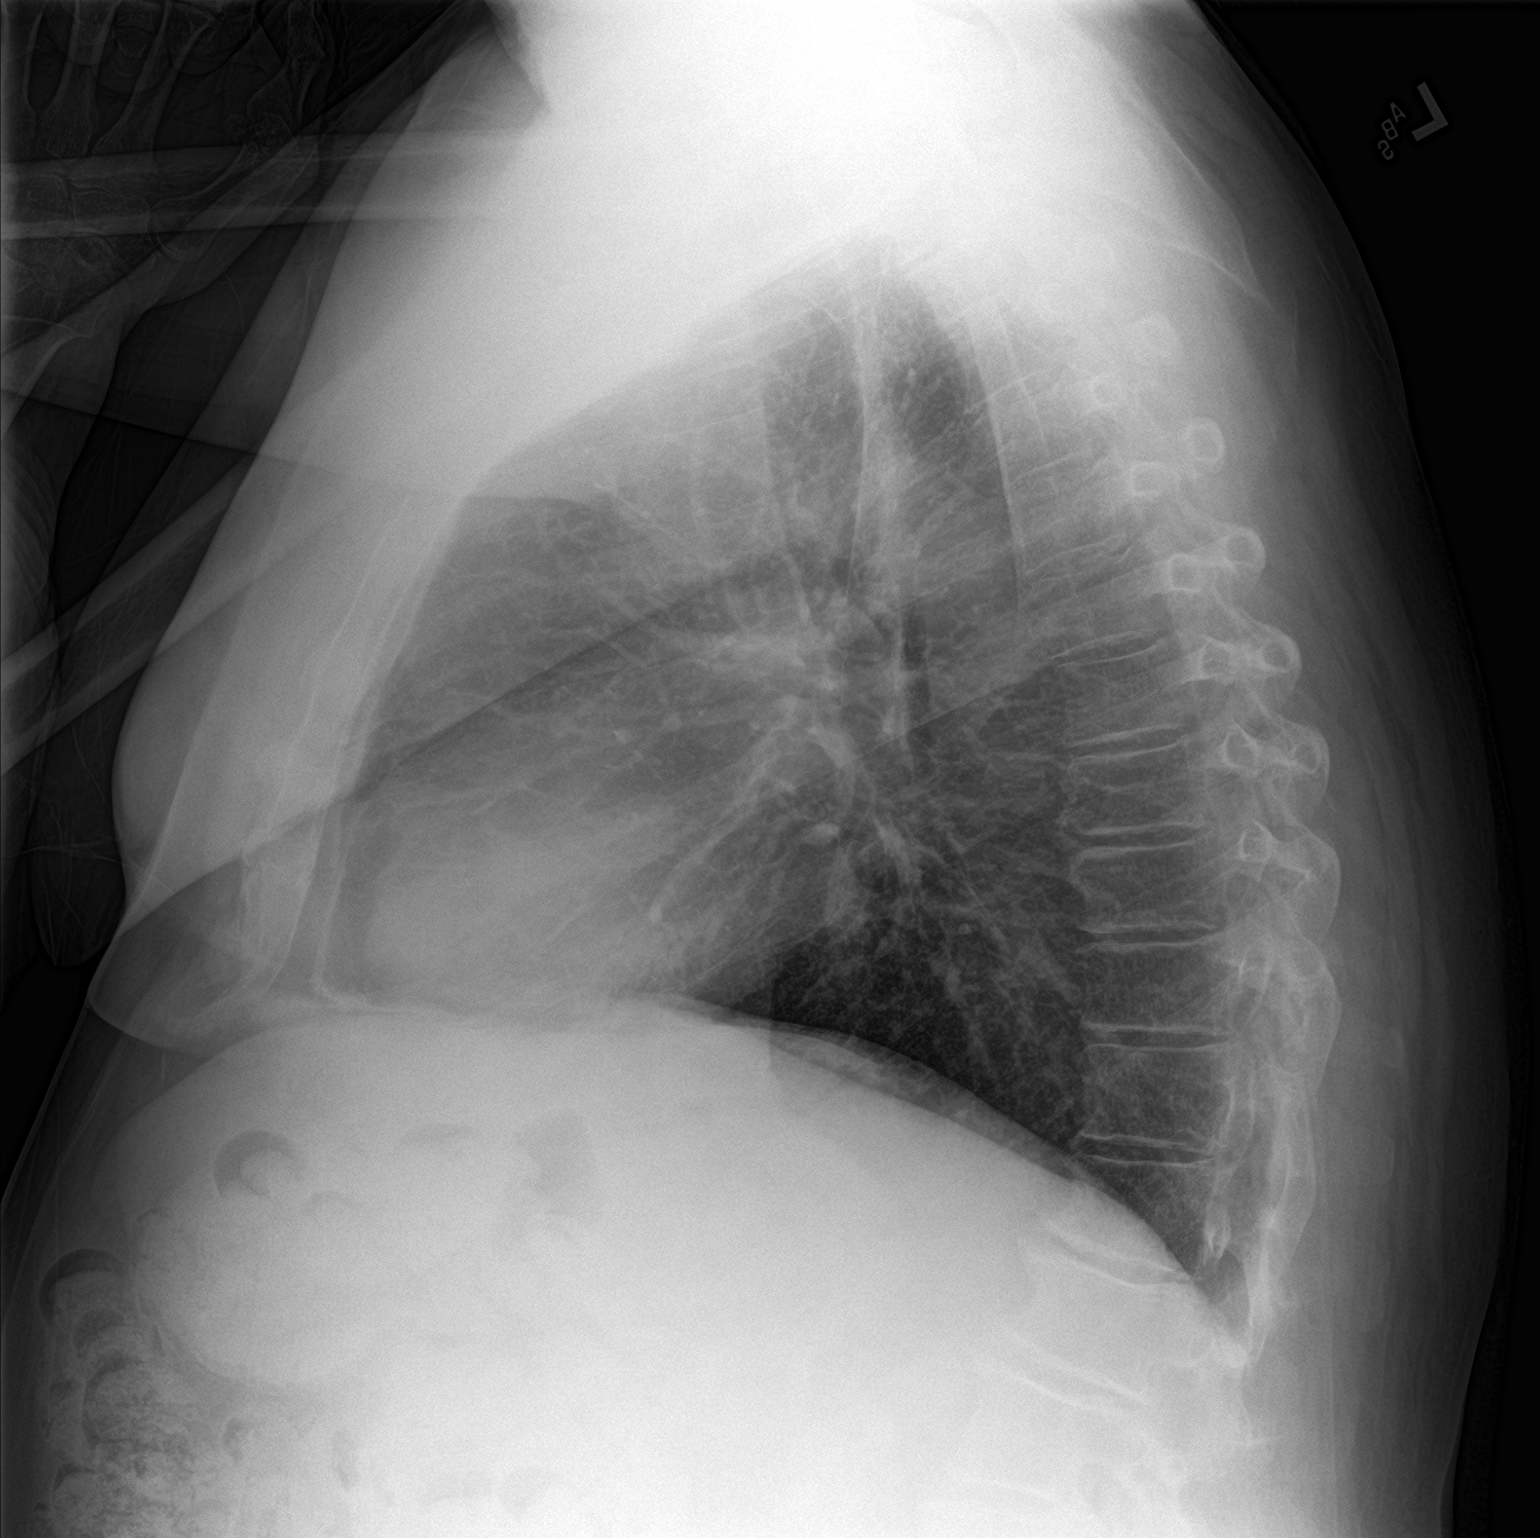

[2 of 2 positions shown; findings below may reference images not displayed]

FINDINGS: The heart size and mediastinal contours are within normal limits.
Both lungs are clear. The visualized skeletal structures are
unremarkable.
IMPRESSION: No acute abnormality noted.

## 2018-04-06 IMAGING — CR DG RIBS 2V*R*
1 series · 3 of 3 positions shown · non-contrast
Comparison: None.

CLINICAL DATA: Recent trip and fall over dog with right-sided chest
pain, initial encounter

EXAM:
RIGHT RIBS - 2 VIEW

[Series 1: view not recorded · 0.14mm/px · 3 of 3 slices shown]
[im 1/3]
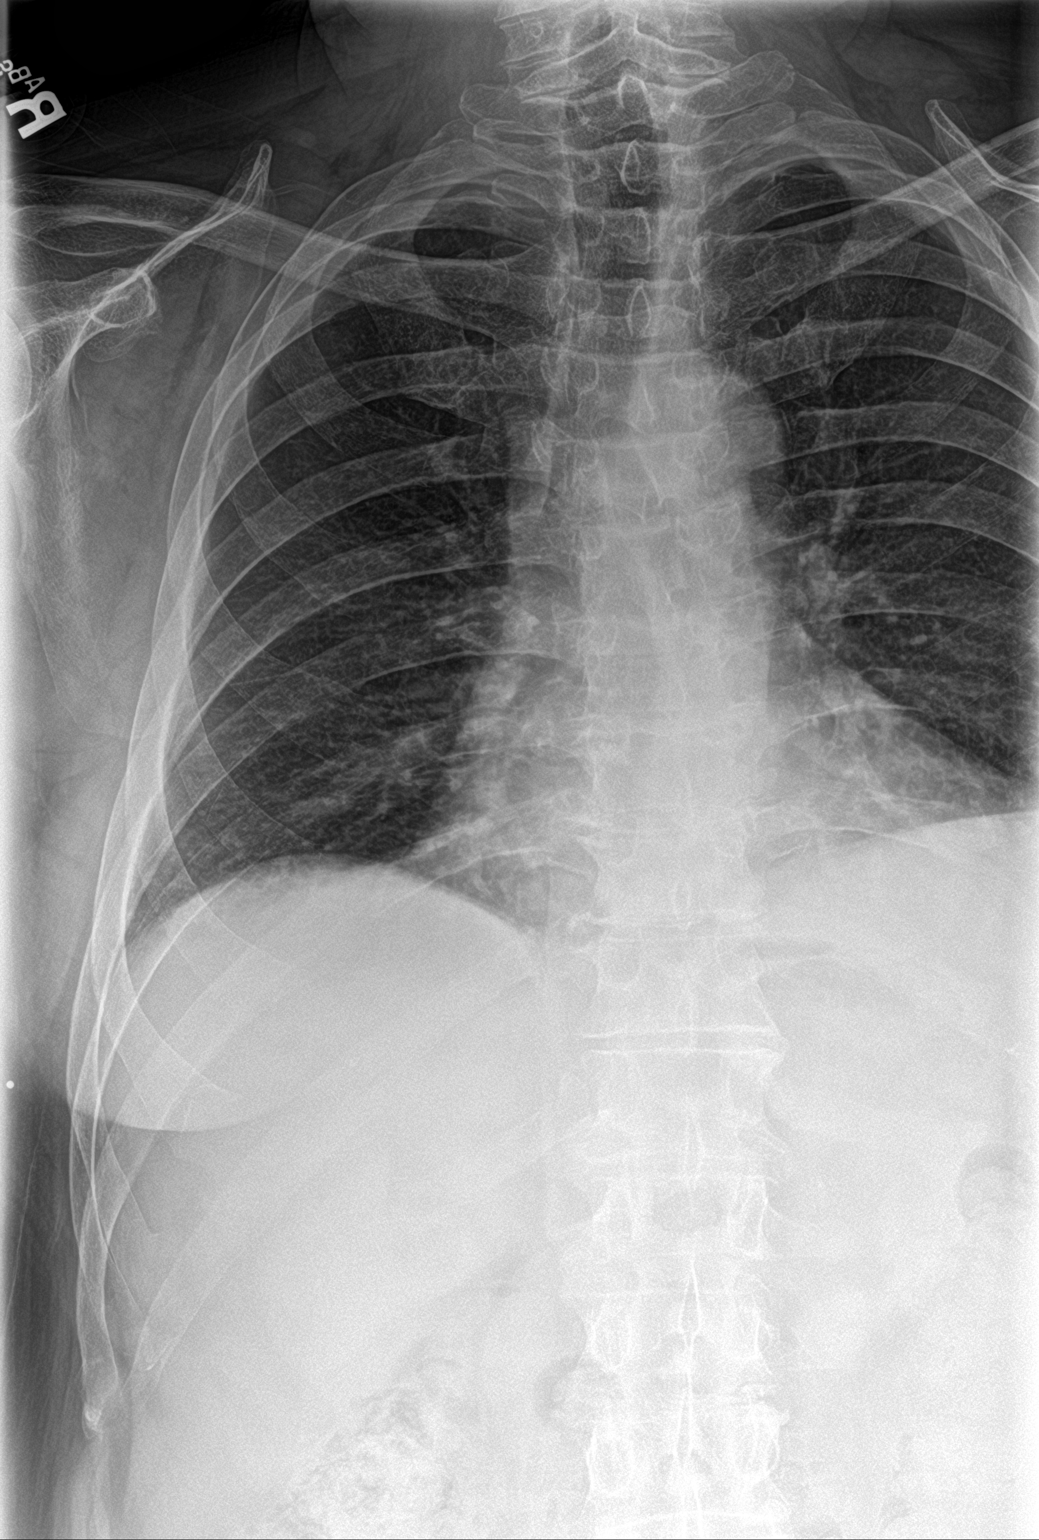
[im 2/3]
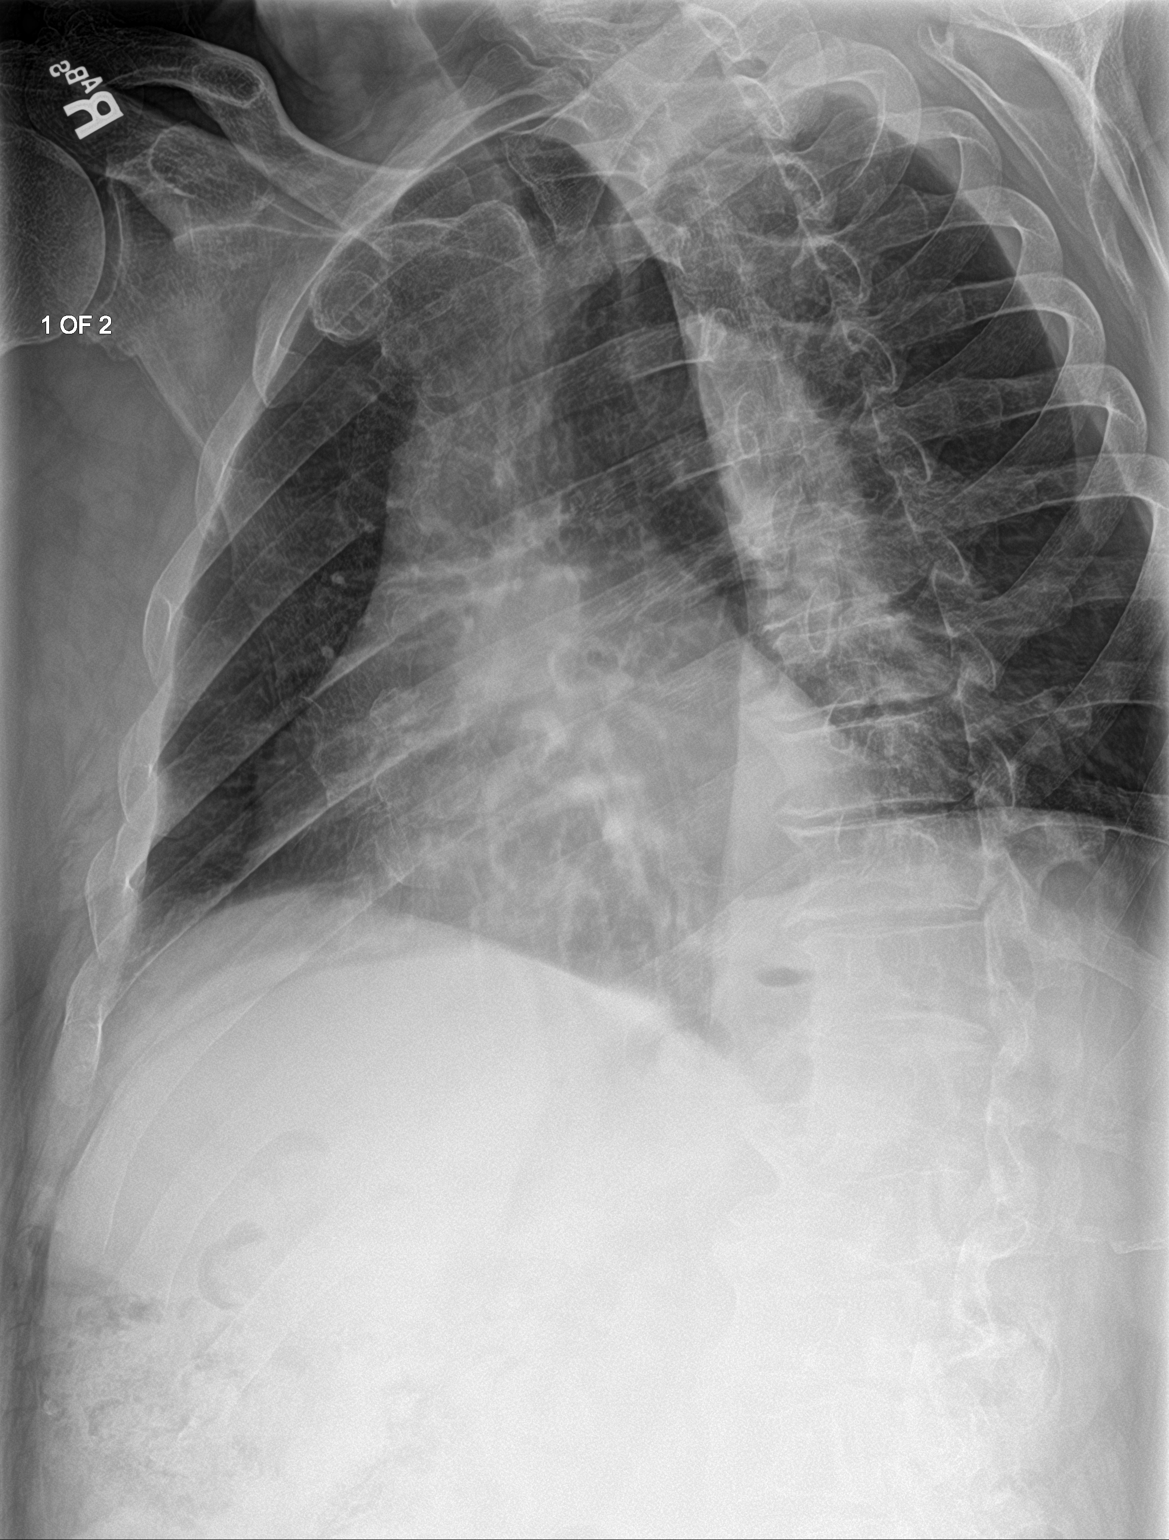
[im 3/3]
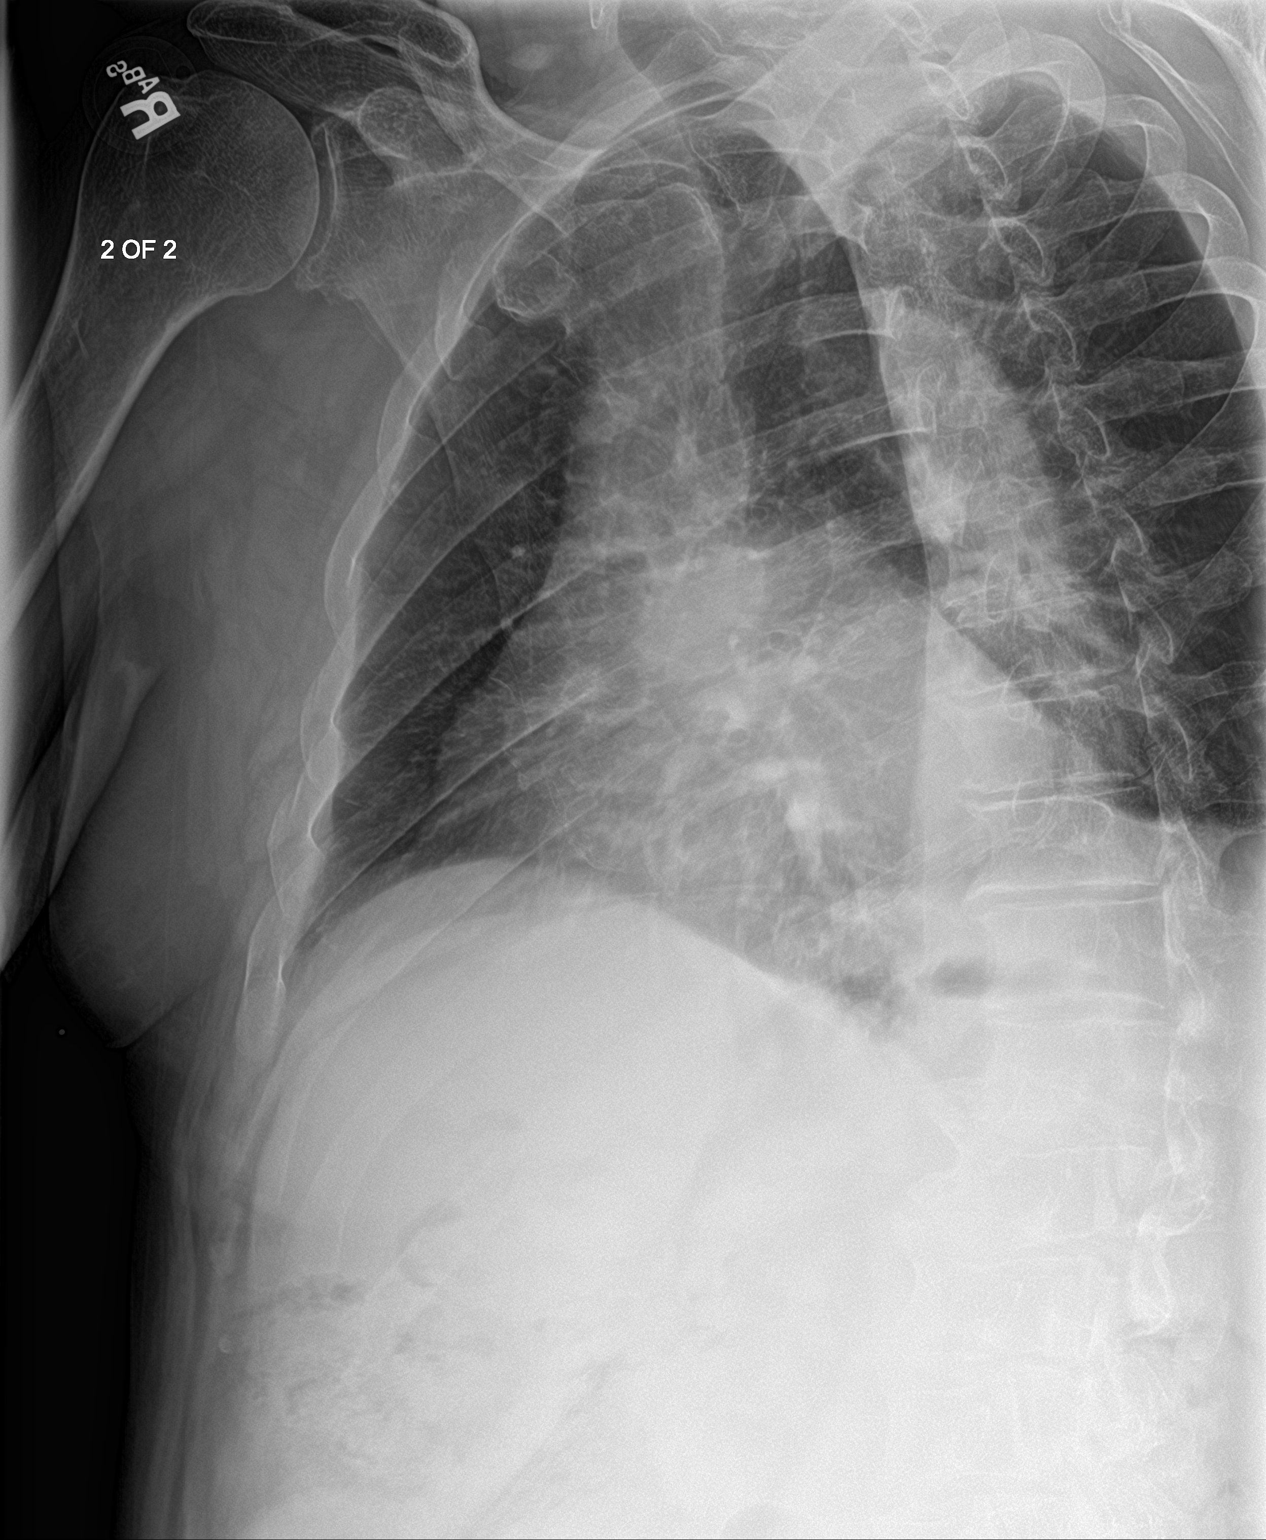

[3 of 3 positions shown; findings below may reference images not displayed]

FINDINGS: Mildly displaced fractures of the right fifth and sixth ribs are
noted anterior laterally. No pneumothorax is seen. No other focal
abnormality is noted.
IMPRESSION: Right fifth and sixth rib fractures without complicating factors.

## 2018-04-10 ENCOUNTER — Telehealth: Payer: Self-pay | Admitting: Urology

## 2018-04-10 NOTE — Telephone Encounter (Signed)
Pt's wife called and states pt is wetting himself 6-8 times per day.  She wants to know if there's anything that can be done.  Mary [phone] please give her a call or leave message.

## 2018-04-10 NOTE — Telephone Encounter (Signed)
Spoke with Albert Wade who stated that pt is having to change clothes 5-6x a day. Albert Wade stated that pt quit taking myrbetriq as pt felt medication was not helping. Please advise.

## 2018-04-10 NOTE — Telephone Encounter (Signed)
This is a big change from his last visit and seems significant.  I strongly recommended he come in for nurse visit to check a PVR to ensure that he is emptying his bladder and not in overflow incontinence.  Also, let us check a UA/urine culture to rule out infection.  We can try increasing his dose of Myrbetriq to 50 mg, please give him 2 weeks of samples to see if this is helpful.  He also needs a follow-up with me.  Hollice Espy, MD

## 2018-04-11 NOTE — Telephone Encounter (Signed)
Patient notified and appointment is scheduled.

## 2018-04-12 ENCOUNTER — Ambulatory Visit (INDEPENDENT_AMBULATORY_CARE_PROVIDER_SITE_OTHER): Payer: Medicare Other

## 2018-04-12 VITALS — BP 164/72 | HR 90 | Ht 70.0 in | Wt 217.0 lb

## 2018-04-12 DIAGNOSIS — R350 Frequency of micturition: Secondary | ICD-10-CM | POA: Diagnosis not present

## 2018-04-12 LAB — URINALYSIS, COMPLETE
Bilirubin, UA: NEGATIVE
Glucose, UA: NEGATIVE
Ketones, UA: NEGATIVE
Leukocytes, UA: NEGATIVE
Nitrite, UA: NEGATIVE
Protein, UA: NEGATIVE
RBC, UA: NEGATIVE
Specific Gravity, UA: 1.02 (ref 1.005–1.030)
Urobilinogen, Ur: 0.2 mg/dL (ref 0.2–1.0)
pH, UA: 5.5 (ref 5.0–7.5)

## 2018-04-12 LAB — MICROSCOPIC EXAMINATION
Bacteria, UA: NONE SEEN
Epithelial Cells (non renal): NONE SEEN /HPF (ref 0–10)

## 2018-04-12 NOTE — Progress Notes (Signed)
PVR: 160  Pt gave a clean catch specimen to be sent for u/a and cx.   Blood pressure (!) 164/72, pulse 90, height 5\' 10"  (1.778 m), weight 217 lb (98.4 kg).

## 2018-04-12 NOTE — Addendum Note (Signed)
Addended by: Toniann Fail C on: 04/12/2018 12:07 PM   Modules accepted: Level of Service

## 2018-04-16 LAB — CULTURE, URINE COMPREHENSIVE

## 2018-04-25 ENCOUNTER — Telehealth: Payer: Self-pay | Admitting: Urology

## 2018-04-25 NOTE — Telephone Encounter (Signed)
Patient's wife called the office today requesting samples of Myrbetriq 50mg .  Dr. Erlene Quan gave him some samples for him to try.  He feels that it is working some, but would like to be on it a little longer to ensure its effectiveness.  Please put some samples up front for him to pick up on 04/26/18,

## 2018-04-25 NOTE — Telephone Encounter (Signed)
2 boxes left at front for pick up

## 2018-05-04 ENCOUNTER — Ambulatory Visit: Payer: Medicare Other | Admitting: Cardiology

## 2018-05-07 ENCOUNTER — Ambulatory Visit: Payer: Medicare HMO | Admitting: Podiatry

## 2018-05-07 ENCOUNTER — Telehealth: Payer: Self-pay | Admitting: Internal Medicine

## 2018-05-07 NOTE — Telephone Encounter (Signed)
Wife states pt has not had a BM in a month, reports he takes opioids daily due to back pain. Pt took a bottle of mag citrate last night and this morning passed a few hard balls of stool. Discussed with wife that we have no appointments till the end of the week and that she may want to call his PCP. Also her to have him try another bottle of mag citrate and possibly an enema. She verbalized understanding. Requested to schedule an appt with Dr. Henrene Pastor. Pt scheduled to see Dr. Henrene Pastor 06/26/18@2 :45pm. Also added pt to the cancellation list. She is aware of appt.

## 2018-05-07 NOTE — Telephone Encounter (Signed)
Patient wife calling stating patient has not had a BM in over a week and is having abd pain. Pt has tried magnesium citrate, and prune juice. Patient wife requesting call for advice.

## 2018-05-11 ENCOUNTER — Telehealth: Payer: Self-pay | Admitting: Urology

## 2018-05-11 NOTE — Telephone Encounter (Signed)
1 box of 4 sleeves - LOT #[id number]

## 2018-05-11 NOTE — Telephone Encounter (Signed)
1 month supply of 50mg  Myrbetriq supplied to the patient.

## 2018-05-23 ENCOUNTER — Ambulatory Visit: Payer: Medicare Other | Admitting: Cardiology

## 2018-05-30 ENCOUNTER — Ambulatory Visit: Payer: Medicare HMO | Admitting: Podiatry

## 2018-06-15 DIAGNOSIS — M5440 Lumbago with sciatica, unspecified side: Secondary | ICD-10-CM | POA: Diagnosis not present

## 2018-06-15 DIAGNOSIS — M5417 Radiculopathy, lumbosacral region: Secondary | ICD-10-CM | POA: Diagnosis not present

## 2018-06-15 DIAGNOSIS — G8929 Other chronic pain: Secondary | ICD-10-CM | POA: Diagnosis not present

## 2018-06-15 DIAGNOSIS — M5442 Lumbago with sciatica, left side: Secondary | ICD-10-CM | POA: Diagnosis not present

## 2018-06-15 DIAGNOSIS — Z981 Arthrodesis status: Secondary | ICD-10-CM | POA: Diagnosis not present

## 2018-06-15 DIAGNOSIS — M810 Age-related osteoporosis without current pathological fracture: Secondary | ICD-10-CM | POA: Diagnosis not present

## 2018-06-15 DIAGNOSIS — M5441 Lumbago with sciatica, right side: Secondary | ICD-10-CM | POA: Diagnosis not present

## 2018-06-18 ENCOUNTER — Ambulatory Visit: Payer: Medicare Other | Admitting: Internal Medicine

## 2018-06-18 ENCOUNTER — Encounter

## 2018-06-20 ENCOUNTER — Telehealth: Payer: Self-pay | Admitting: Urology

## 2018-06-20 MED ORDER — OXYBUTYNIN CHLORIDE 5 MG PO TABS: 5.0000 mg | ORAL_TABLET | Freq: Three times a day (TID) | ORAL | 0 refills | Status: DC | PRN

## 2018-06-20 NOTE — Telephone Encounter (Signed)
Patient's wife notified, she states that they would like to try a different medication if possible.

## 2018-06-20 NOTE — Telephone Encounter (Signed)
Patient's wife called and stated that the myrbetriq samples you gave him work but they feel like he needs a stronger dosage. She said he is out and wanted to know if she could get more samples and should he be taking a higher dose of it because he is still urinating a lot and he is still urinating on him self a lot. Please advise.   Sharyn Lull

## 2018-06-20 NOTE — Telephone Encounter (Signed)
This patient was already given samples last month of the 50 mg dose which is the highest dose.  Would you like to fill this prescription?  If so, please prescribe.  Hollice Espy, MD

## 2018-06-20 NOTE — Telephone Encounter (Signed)
At this point in time, he will need to come in and make an appointment.    I can prescribe him something different like oxybutynin to hold him over to the appointment.  This causes side effects including dry eyes, dry mouth, constipation which are new he has an issue with.  I also want to make sure that we see him fairly soon because he did have an elevated PVR, 160 cc last time and went to ensure that this is not overflow incontinence.  Prescription was sent to pharmacy.  Please follow-up with me within the next month.  Hollice Espy, MD

## 2018-06-22 NOTE — Telephone Encounter (Signed)
Called pt he states that he has picked up medication and has scheduled a f/u appt.

## 2018-06-26 ENCOUNTER — Ambulatory Visit: Payer: Medicare Other | Admitting: Internal Medicine

## 2018-07-02 ENCOUNTER — Telehealth: Payer: Self-pay | Admitting: Urology

## 2018-07-02 NOTE — Telephone Encounter (Signed)
Pt wife called office stating that her husband only has 1 Ditropan pill left, pt needs more, requesting a refill and clarification on how often he is to take the medication, wife states pt has been taking the medication as directed, every 8 hrs. Asking for advice to continue at every 8 hrs and needs refill. Please advise. Thanks.

## 2018-07-03 ENCOUNTER — Other Ambulatory Visit: Payer: Self-pay | Admitting: Urology

## 2018-07-03 ENCOUNTER — Other Ambulatory Visit: Payer: Self-pay | Admitting: Internal Medicine

## 2018-07-03 NOTE — Telephone Encounter (Signed)
Rx sent to pharmacy   

## 2018-07-18 ENCOUNTER — Other Ambulatory Visit: Payer: Self-pay | Admitting: Urology

## 2018-07-19 DIAGNOSIS — M48061 Spinal stenosis, lumbar region without neurogenic claudication: Secondary | ICD-10-CM | POA: Diagnosis not present

## 2018-07-19 DIAGNOSIS — M4804 Spinal stenosis, thoracic region: Secondary | ICD-10-CM | POA: Diagnosis not present

## 2018-07-19 DIAGNOSIS — M5441 Lumbago with sciatica, right side: Secondary | ICD-10-CM | POA: Diagnosis not present

## 2018-07-19 DIAGNOSIS — G8929 Other chronic pain: Secondary | ICD-10-CM | POA: Diagnosis not present

## 2018-07-19 DIAGNOSIS — M5442 Lumbago with sciatica, left side: Secondary | ICD-10-CM | POA: Diagnosis not present

## 2018-07-19 DIAGNOSIS — M5440 Lumbago with sciatica, unspecified side: Secondary | ICD-10-CM | POA: Diagnosis not present

## 2018-07-25 ENCOUNTER — Other Ambulatory Visit: Payer: Medicare Other

## 2018-07-25 DIAGNOSIS — Z0001 Encounter for general adult medical examination with abnormal findings: Secondary | ICD-10-CM

## 2018-07-25 DIAGNOSIS — C61 Malignant neoplasm of prostate: Secondary | ICD-10-CM

## 2018-07-26 LAB — PSA: Prostate Specific Ag, Serum: <0.1 ng/mL (ref 0.0–4.0)

## 2018-08-01 ENCOUNTER — Ambulatory Visit: Payer: Medicare Other | Admitting: Urology

## 2018-08-01 ENCOUNTER — Other Ambulatory Visit: Payer: Self-pay

## 2018-08-01 ENCOUNTER — Encounter: Payer: Self-pay | Admitting: Urology

## 2018-08-01 VITALS — BP 129/67 | HR 82 | Ht 70.0 in | Wt 202.2 lb

## 2018-08-01 DIAGNOSIS — C61 Malignant neoplasm of prostate: Secondary | ICD-10-CM

## 2018-08-01 DIAGNOSIS — R339 Retention of urine, unspecified: Secondary | ICD-10-CM

## 2018-08-01 DIAGNOSIS — R350 Frequency of micturition: Secondary | ICD-10-CM

## 2018-08-01 LAB — URINALYSIS, COMPLETE
Bilirubin, UA: NEGATIVE
Glucose, UA: NEGATIVE
Ketones, UA: NEGATIVE
Leukocytes, UA: NEGATIVE
Nitrite, UA: NEGATIVE
Protein, UA: NEGATIVE
RBC, UA: NEGATIVE
Specific Gravity, UA: 1.015 (ref 1.005–1.030)
Urobilinogen, Ur: 0.2 mg/dL (ref 0.2–1.0)
pH, UA: 7 (ref 5.0–7.5)

## 2018-08-01 LAB — MICROSCOPIC EXAMINATION
Epithelial Cells (non renal): NONE SEEN /HPF (ref 0–10)
RBC, UA: NONE SEEN /HPF (ref 0–2)

## 2018-08-01 LAB — BLADDER SCAN AMB NON-IMAGING

## 2018-08-01 MED ORDER — LEUPROLIDE ACETATE (3 MONTH) 22.5 MG IM KIT
22.5000 mg | PACK | Freq: Once | INTRAMUSCULAR | Status: AC
  Administered 2018-08-01: 22.5 mg via INTRAMUSCULAR

## 2018-08-01 MED ORDER — MIRABEGRON ER 50 MG PO TB24: 50.0000 mg | ORAL_TABLET | Freq: Every day | ORAL | 11 refills | Status: DC

## 2018-08-01 NOTE — Progress Notes (Signed)
08/01/2018 5:19 PM   [name] 1946-05-22 [id number]  Referring provider: Biagio Borg, MD [address] [place], [place] [address]  Chief Complaint  Patient presents with  . Urinary Frequency    HPI: 72 year old male with history of BPH and high risk prostate cancer status post IM RT/Lupron presents today for routine follow-up.   He was diagnosed with prostate cancer in 11/2016.  PSA at the time of biopsy was 17.6. He has 6 of 12 cores positive. 2 of the cores of Gleason 4+4 = 8 disease. 3of the cores have Gleason 4+3 = 7 disease. One core was Gleason 3+3 = 6 disease. All cores that were positive ranged from 50 to 100% volume disease. TRUS vol 21 g.  He completed IMRT as of 03/2017.  Recent PSA undetectable as of 07/25/2018.  Today, he reports that he has had severe urinary urgency and frequency when he ran out of oxybutynin.  He is also having multiple episodes of urinary incontinence which has also improved significantly.  He thinks that this medication helps him significantly.  He does feel like he empties his bladder although his PVR today is 205 mL.  He would like to continue this medications.  He has no history of UTI or retention.  His wife and his today though that he is been having issues with memory and attention.  This is new onset for him.  He is been trying to be physically active but is having issues with back pain and seeing a surgeon at [facility] in the near future.  He has lost weight and has stopped eating cookies on a daily basis.  PMH: Past Medical History:  Diagnosis Date  . Acute bronchitis 01/01/2015  . Acute upper respiratory infection 05/19/2016  . Anxiety   . Arthritis    back,wrists,hx. previous fractures  . BPH (benign prostatic hypertrophy)   . BRONCHITIS, CHRONIC 04/29/2008   Qualifier: Diagnosis of  By: Halford Chessman MD, Vineet    . CAD 10/16/2009   Qualifier: Diagnosis of  By: Lynden Ang    . CAD (coronary artery disease)    angioplasty   . Chronic back pain   . Chronic bronchitis   . 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 Confusion 04/27/2017  . Depression   . Encounter for preventative adult health care exam with abnormal findings 12/20/2013  . Fever 04/27/2017  . GERD (gastroesophageal reflux disease)   . Gynecomastia 05/12/2015  . H/O hiatal hernia    pts wife states pt does not have   . Heart murmur   . Hypercholesteremia   . Hyperglycemia 05/19/2016  . HYPERLIPIDEMIA 04/29/2008   Qualifier: Diagnosis of  By: Jim Like    . Hypertension   . Impaired glucose tolerance 12/20/2013  . Insomnia 04/27/2017  . Lumbosacral radiculopathy 10/14/2016  . MI (myocardial infarction) ([facility])    mid-'90's  . MYOCARDIAL INFARCTION 04/29/2008   Qualifier: History of  By: Jim Like    . OSA (obstructive sleep apnea) 05/13/2011   Split night study 2011:  AHI 21/hr with obstructive and central events.  Placed on cpap and titrated to 13cm with reasonable control, but attempts to increase pressure increased the number of central events.  Medium resmed quattro full face mask used.    . Peripheral neuropathy    peripheral neuropathy  . Phimosis/adherent prepuce 10/21/2013  . Pneumonia   . Prostate cancer ([facility])   . Shortness of breath 11/05/2010   Qualifier: Diagnosis  of  By: Marilynne Halsted RN, BSN, [place]    . Sleep apnea    mild-no cpap, unable to tolerate  . Umbilical hernia   . Umbilical hernia without obstruction and without gangrene 05/19/2016  . URI (upper respiratory infection) 04/27/2017  . UTI (urinary tract infection) 12/01/2016  . Varicose veins   . Varicose veins of bilateral lower extremities with other complications 40/07/6760  . Wheezing 01/01/2015    Surgical History: Past Surgical History:  Procedure Laterality Date  . ANGIOPLASTY    . APPENDECTOMY    . BACK SURGERY     x3  . CARDIAC CATHETERIZATION     11'11  . CIRCUMCISION N/A 10/21/2013   Procedure: CIRCUMCISION ADULT;   Surgeon: Bernestine Amass, MD;  Location: [facility];  Service: Urology;  Laterality: N/A;  . CYSTOSCOPY N/A 10/21/2013   Procedure: CYSTOSCOPY FLEXIBLE;  Surgeon: Bernestine Amass, MD;  Location: [facility];  Service: Urology;  Laterality: N/A;  . INSERTION OF MESH N/A 06/17/2016   Procedure: INSERTION OF MESH;  Surgeon: Excell Seltzer, MD;  Location: [facility];  Service: General;  Laterality: N/A;  . UMBILICAL HERNIA REPAIR N/A 06/17/2016   Procedure: REPAIR UMBILICAL HERNIA WITH MESH;  Surgeon: Excell Seltzer, MD;  Location: [facility];  Service: General;  Laterality: N/A;  . WRIST SURGERY Right     Home Medications:  Allergies as of 08/01/2018      Reactions   Seroquel [quetiapine] Hives      Medication List        Accurate as of 08/01/18  5:19 PM. Always use your most recent med list.          aspirin 81 MG tablet Take 81 mg by mouth daily.   DULoxetine 30 MG capsule Commonly known as:  CYMBALTA Take 30 mg by mouth daily.   furosemide 40 MG tablet Commonly known as:  LASIX Take1 tablet on day one(40 mg)Then take1 and1/2 tab on day two(60mg )and then take two tablets on daythree(80mg ).Then go back to day one   methadone 10 MG tablet Commonly known as:  DOLOPHINE Take 10 mg by mouth 4 (four) times daily.   mirabegron ER 50 MG Tb24 tablet Commonly known as:  MYRBETRIQ Take 1 tablet (50 mg total) by mouth daily.   potassium chloride 10 MEQ tablet Commonly known as:  KLOR-CON 10 2 tabs by mouth per day on days taking furosemide   PROAIR HFA 108 (90 Base) MCG/ACT inhaler Generic drug:  albuterol INHALE 1 TO 2 PUFFS BY MOUTH INTO THE LUNGS EVERY 6 HOURS AS NEEDED FOR WHEEZING OR SHORTNESS OF BREATH   simvastatin 80 MG tablet Commonly known as:  ZOCOR Take 1 tablet (80 mg total) by mouth every evening.   traZODone 50 MG tablet Commonly known as:  DESYREL TAKE ONE-HALF TO ONE TABLET BY MOUTH AT BEDTIME AS NEEDED FOR SLEEP       Allergies:  Allergies  Allergen Reactions  .  Seroquel [Quetiapine] Hives    Family History: Family History  Problem Relation Age of Onset  . Prostate cancer Father   . Emphysema Father   . Hyperlipidemia Father   . Hyperlipidemia Mother     Social History:  reports that he quit smoking about 22 years ago. His smoking use included cigarettes. He has a 20.00 pack-year smoking history. He has never used smokeless tobacco. He reports that he does not drink alcohol or use drugs.  ROS: UROLOGY Frequent Urination?: Yes Hard to postpone urination?: Yes Burning/pain with  urination?: No Get up at night to urinate?: Yes Leakage of urine?: Yes Urine stream starts and stops?: Yes Trouble starting stream?: Yes Do you have to strain to urinate?: Yes Blood in urine?: No Urinary tract infection?: No Sexually transmitted disease?: No Injury to kidneys or bladder?: No Painful intercourse?: No Weak stream?: No Erection problems?: No Penile pain?: No  Gastrointestinal Nausea?: No Vomiting?: No Indigestion/heartburn?: No Diarrhea?: No Constipation?: Yes  Constitutional Fever: No Night sweats?: No Weight loss?: No Fatigue?: No  Skin Skin rash/lesions?: No Itching?: No  Eyes Blurred vision?: No Double vision?: No  Ears/Nose/Throat Sore throat?: No Sinus problems?: No  Hematologic/Lymphatic Swollen glands?: No Easy bruising?: No  Cardiovascular Leg swelling?: Yes Chest pain?: No  Respiratory Cough?: No Shortness of breath?: No  Endocrine Excessive thirst?: Yes  Musculoskeletal Back pain?: Yes Joint pain?: Yes  Neurological Headaches?: No Dizziness?: No  Psychologic Depression?: Yes Anxiety?: No  Physical Exam: BP 129/67   Pulse 82   Ht 5\' 10"  (1.778 m)   Wt 202 lb 3.2 oz (91.7 kg)   BMI 29.01 kg/m   Constitutional:  Alert and oriented, No acute distress.  Accompanied by wife today. HEENT: [place] AT, moist mucus membranes.  Trachea midline, no masses. Cardiovascular: Significant right lower  extremity varicose veins.Marland Kitchen Respiratory: Normal respiratory effort, no increased work of breathing. GI: Abdomen is soft, nontender, nondistended, no abdominal masses GU: No CVA tenderness Skin: No rashes, bruises or suspicious lesions. Neurologic: Grossly intact, no focal deficits, moving all 4 extremities. Psychiatric: Normal mood and affect.  Laboratory Data: Lab Results  Component Value Date   WBC 4.8 02/07/2018   HGB 11.5 (L) 02/07/2018   HCT 33.5 (L) 02/07/2018   MCV 89.4 02/07/2018   PLT 180.0 02/07/2018    Lab Results  Component Value Date   CREATININE 0.92 02/07/2018    PSA as above  Lab Results  Component Value Date   HGBA1C 6.1 02/07/2018    Urinalysis/ Pertinent Imaging: Results for orders placed or performed in visit on 08/01/18  Microscopic Examination  Result Value Ref Range   WBC, UA 0-5 0 - 5 /hpf   RBC, UA None seen 0 - 2 /hpf   Epithelial Cells (non renal) None seen 0 - 10 /hpf   Casts Present (A) None seen /lpf   Cast Type Hyaline casts N/A   Mucus, UA Present (A) Not Estab.   Bacteria, UA Few (A) None seen/Few  Urinalysis, Complete  Result Value Ref Range   Specific Gravity, UA 1.015 1.005 - 1.030   pH, UA 7.0 5.0 - 7.5   Color, UA Yellow Yellow   Appearance Ur Clear Clear   Leukocytes, UA Negative Negative   Protein, UA Negative Negative/Trace   Glucose, UA Negative Negative   Ketones, UA Negative Negative   RBC, UA Negative Negative   Bilirubin, UA Negative Negative   Urobilinogen, Ur 0.2 0.2 - 1.0 mg/dL   Nitrite, UA Negative Negative   Microscopic Examination See below:   BLADDER SCAN AMB NON-IMAGING  Result Value Ref Range   Scan Result 245ml     Assessment & Plan:    1. Prostate cancer [facility]) Personal history of high risk prostate cancer status post IMRT completed 03/2017 Plan for continued Lupron for 2 to 3 years He was given a 41-month double today as we are currently out of 58-month echo and will return again in 3 months for  his next injection Continue to encourage weightbearing exercise, calcium supplementation  2. Urinary frequency Oxybutynin 3 times a day is effective, however, given his recent change in cognition, this is not a good medication for him Samples of Myrbetriq 50 mg x 2 weeks given Prescription sent to pharmacy - BLADDER SCAN AMB NON-IMAGING  3. Incomplete bladder emptying We will continue to monitor this closely At this point in time, he has had no bladder stones, infections, or episodes of urinary retention Plan to recheck PVR in 3 months at nurse visit  Return in about 3 months (around 11/01/2018) for Lupron/  PVR nurse visit  77month, MD in 6 months with  PSA/ PVR/ Lupon .  Hollice Espy, MD  [facility] [address], [place], [place] [address] [phone]

## 2018-08-01 NOTE — Progress Notes (Signed)
Lupron IM Injection   Due to Prostate Cancer patient is present today for a Lupron Injection.  Medication: Lupron 3 month Dose: 22.5 mg  Location: left upper outer buttocks Lot: [id number] Exp: 11/15/2020  Patient tolerated well, no complications were noted  Performed by: Fonnie Jarvis, CMA  Follow up: 3 month

## 2018-08-06 DIAGNOSIS — M4320 Fusion of spine, site unspecified: Secondary | ICD-10-CM | POA: Diagnosis not present

## 2018-08-06 DIAGNOSIS — M549 Dorsalgia, unspecified: Secondary | ICD-10-CM | POA: Diagnosis not present

## 2018-08-06 DIAGNOSIS — M8580 Other specified disorders of bone density and structure, unspecified site: Secondary | ICD-10-CM | POA: Diagnosis not present

## 2018-08-06 DIAGNOSIS — M4804 Spinal stenosis, thoracic region: Secondary | ICD-10-CM | POA: Diagnosis not present

## 2018-08-06 DIAGNOSIS — M48061 Spinal stenosis, lumbar region without neurogenic claudication: Secondary | ICD-10-CM | POA: Diagnosis not present

## 2018-08-06 DIAGNOSIS — M4316 Spondylolisthesis, lumbar region: Secondary | ICD-10-CM | POA: Diagnosis not present

## 2018-08-07 ENCOUNTER — Telehealth: Payer: Self-pay

## 2018-08-07 NOTE — Telephone Encounter (Signed)
Please clarify why he would need to see infecitous disease, as I do not know this

## 2018-08-07 NOTE — Telephone Encounter (Signed)
Dr.Muhammad Abdelarr ([facility]) requested a referral from PCP to infectious disease to be done within the next month. It is for preliminary measures before his surery could be scheduled and done. Going through [facility] for the referral would cause him to have a appointment date in November and they would like to have the surgery done bu November.    Call wife, Albert Wade, back [phone].    Call Albert Wade([facility]) [phone] for any questions

## 2018-08-07 NOTE — Telephone Encounter (Signed)
Copied from [phone] [phone]. Topic: Referral - Request >> Aug 07, 2018  9:36 AM Bea Graff, NT wrote: Reason for CRM: Pts wife calling and requesting a referral to an infectious diease doctor in [place]. He is currently seeing Dr. Sherrian Divers for this but he has to wait until November but he is needing back surgery and is not wanting to wait that long. Please let pt or his wife know when referral has been places.

## 2018-08-07 NOTE — Telephone Encounter (Signed)
Sounds good, but we still need a reason for the referral, as I would not want to just guess

## 2018-08-08 ENCOUNTER — Telehealth: Payer: Self-pay | Admitting: Internal Medicine

## 2018-08-08 DIAGNOSIS — B999 Unspecified infectious disease: Secondary | ICD-10-CM

## 2018-08-08 NOTE — Telephone Encounter (Signed)
Form from [facility] placed on PCP desk.

## 2018-08-08 NOTE — Telephone Encounter (Signed)
Ok to let pt know, we have referred to ID per [facility] request due to his hx of recurrent infections and need for upcoming spinal surgury

## 2018-08-08 NOTE — Telephone Encounter (Signed)
Called pt, LVM with details.  

## 2018-08-29 ENCOUNTER — Ambulatory Visit: Payer: Medicare Other | Admitting: Internal Medicine

## 2018-09-07 ENCOUNTER — Ambulatory Visit (INDEPENDENT_AMBULATORY_CARE_PROVIDER_SITE_OTHER): Payer: Medicare Other | Admitting: Internal Medicine

## 2018-09-07 ENCOUNTER — Encounter

## 2018-09-07 DIAGNOSIS — I10 Essential (primary) hypertension: Secondary | ICD-10-CM

## 2018-09-07 DIAGNOSIS — E782 Mixed hyperlipidemia: Secondary | ICD-10-CM | POA: Diagnosis not present

## 2018-09-07 DIAGNOSIS — I2510 Atherosclerotic heart disease of native coronary artery without angina pectoris: Secondary | ICD-10-CM

## 2018-09-07 NOTE — Patient Instructions (Addendum)
Your physician recommends that you continue on your current medications as directed. Please refer to the Current Medication list given to you today.  Your physician wants you to follow-up in: 12 months with Dr. Theressa Stamps will receive a reminder letter in the mail two months in advance. If you don't receive a letter, please call our office to schedule the follow-up appointment.

## 2018-09-07 NOTE — Progress Notes (Addendum)
Cardiology Office Note   Date:  09/09/2018   ID:  Albert Wade, Albert Wade 1946-03-01, [name] [id number]  PCP:  Biagio Borg, MD  Cardiologist:   Dorris Carnes, MD    F/U of mild CAD and HTN  Patient being eval for back surgery     History of Present Illness: Albert Wade is a 72 y.o. male with a history of mld CAD by cath in 2011, COPD ,HL and HTN  I saw him in March 2017.  Pt is bothered by significant back pain Breathing is OK   No  CP   Activity is up doewn   Can be very active with outdoor work   Outpatient Medications Prior to Visit  Medication Sig Dispense Refill  . ALPRAZolam (XANAX) 1 MG tablet Take 1 tablet by mouth 2 (two) times daily.    Marland Kitchen aspirin 81 MG tablet Take 81 mg by mouth daily.    . DULoxetine (CYMBALTA) 30 MG capsule Take 30 mg by mouth daily.    . furosemide (LASIX) 40 MG tablet Take1 tablet on day one(40 mg)Then take1 and1/2 tab on day two(60mg )and then take two tablets on daythree(80mg ).Then go back to day one 165 tablet 5  . methadone (DOLOPHINE) 10 MG tablet Take 10 mg by mouth 4 (four) times daily.     . mirabegron ER (MYRBETRIQ) 50 MG TB24 tablet Take 1 tablet (50 mg total) by mouth daily. 30 tablet 11  . oxyCODONE-acetaminophen (PERCOCET) 10-325 MG tablet Take 5 tablets by mouth daily.    . potassium chloride (KLOR-CON 10) 10 MEQ tablet 2 tabs by mouth per day on days taking furosemide 180 tablet 3  . PROAIR HFA 108 (90 Base) MCG/ACT inhaler INHALE 1 TO 2 PUFFS BY MOUTH INTO THE LUNGS EVERY 6 HOURS AS NEEDED FOR WHEEZING OR SHORTNESS OF BREATH 25.5 g 2  . simvastatin (ZOCOR) 80 MG tablet Take 1 tablet (80 mg total) by mouth every evening. 90 tablet 3  . traZODone (DESYREL) 50 MG tablet TAKE ONE-HALF TO ONE TABLET BY MOUTH AT BEDTIME AS NEEDED FOR SLEEP 90 tablet 1   No facility-administered medications prior to visit.      Allergies:   Seroquel [quetiapine]   Past Medical History:  Diagnosis Date  . Acute bronchitis 01/01/2015  . Acute upper  respiratory infection 05/19/2016  . Anxiety   . Arthritis    back,wrists,hx. previous fractures  . BPH (benign prostatic hypertrophy)   . BRONCHITIS, CHRONIC 04/29/2008   Qualifier: Diagnosis of  By: Halford Chessman MD, [place]    . CAD 10/16/2009   Qualifier: Diagnosis of  By: Lynden Ang    . CAD (coronary artery disease)    angioplasty   . Chronic back pain   . Chronic bronchitis   . 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 Confusion 04/27/2017  . Depression   . Encounter for preventative adult health care exam with abnormal findings 12/20/2013  . Fever 04/27/2017  . GERD (gastroesophageal reflux disease)   . Gynecomastia 05/12/2015  . H/O hiatal hernia    pts wife states pt does not have   . Heart murmur   . Hypercholesteremia   . Hyperglycemia 05/19/2016  . HYPERLIPIDEMIA 04/29/2008   Qualifier: Diagnosis of  By: Jim Like    . Hypertension   . Impaired glucose tolerance 12/20/2013  . Insomnia 04/27/2017  . Lumbosacral radiculopathy 10/14/2016  . MI (myocardial infarction) ([facility])    mid-'90's  . MYOCARDIAL INFARCTION 04/29/2008  Qualifier: History of  By: Jim Like    . OSA (obstructive sleep apnea) 05/13/2011   Split night study 2011:  AHI 21/hr with obstructive and central events.  Placed on cpap and titrated to 13cm with reasonable control, but attempts to increase pressure increased the number of central events.  Medium resmed quattro full face mask used.    . Peripheral neuropathy    peripheral neuropathy  . Phimosis/adherent prepuce 10/21/2013  . Pneumonia   . Prostate cancer ([facility])   . Shortness of breath 11/05/2010   Qualifier: Diagnosis of  By: Marilynne Halsted, RN, BSN, Jacqu[place]    . Sleep apnea    mild-no cpap, unable to tolerate  . Umbilical hernia   . Umbilical hernia without obstruction and without gangrene 05/19/2016  . URI (upper respiratory infection) 04/27/2017  . UTI (urinary tract infection) 12/01/2016  . Varicose veins   . Varicose veins of  bilateral lower extremities with other complications 43/15/4008  . Wheezing 01/01/2015    Past Surgical History:  Procedure Laterality Date  . ANGIOPLASTY    . APPENDECTOMY    . BACK SURGERY     x3  . CARDIAC CATHETERIZATION     11'11  . CIRCUMCISION N/A 10/21/2013   Procedure: CIRCUMCISION ADULT;  Surgeon: Bernestine Amass, MD;  Location[facility];  Service: Urology;  Laterality: N/A;  . CYSTOSCOPY N/A 10/21/2013   Procedure: CYSTOSCOPY FLEXIBLE;  Surgeon: Bernestine Amass, MD;  Location: [facility];  Service: Urology;  Laterality: N/A;  . INSERTION OF MESH N/A 06/17/2016   Procedure: INSERTION OF MESH;  Surgeon: Excell Seltzer, MD;  Location: [facility];  Service: General;  Laterality: N/A;  . UMBILICAL HERNIA REPAIR N/A 06/17/2016   Procedure: REPAIR UMBILICAL HERNIA WITH MESH;  Surgeon: Excell Seltzer, MD;  Location: [facility];  Service: General;  Laterality: N/A;  . WRIST SURGERY Right      Social History:  The patient  reports that he quit smoking about 22 years ago. His smoking use included cigarettes. He has a 20.00 pack-year smoking history. He has never used smokeless tobacco. He reports that he does not drink alcohol or use drugs.   Family History:  The patient's family history includes Emphysema in his father; Hyperlipidemia in his father and mother; Prostate cancer in his father.    ROS:  Please see the history of present illness. All other systems are reviewed and  Negative to the above problem except as noted.    PHYSICAL EXAM: VS:  BP 116/62   P 65   O2 sat 99%   Wt 207    GEN:  Obese 72 yo  in no acute distress  HEENT: normal  Neck: no JVD, carotid bruits, or masses Cardiac: RRR; no murmurs, rubs, or gallops,no edema  Respiratory:  clear to auscultation bilaterally, normal work of breathing GI: soft, nontender, nondistended, + BS  No hepatomegaly  MS: no deformity Moving all extremities   Skin: warm and dry, no rash Neuro:  Strength and sensation are intact Psych:  euthymic mood, full affect   EKG:  EKG is  Not ordered toda    Lipid Panel    Component Value Date/Time   CHOL 98 02/07/2018 1138   TRIG 165.0 (H) 02/07/2018 1138   HDL 30.50 (L) 02/07/2018 1138   CHOLHDL 3 02/07/2018 1138   VLDL 33.0 02/07/2018 1138   LDLCALC 35 02/07/2018 1138   LDLDIRECT 78.0 05/19/2016 1610      Wt Readings from Last 3 Encounters:  08/01/18 202 lb 3.2 oz (91.7 kg)  04/12/18 217 lb (98.4 kg)  04/05/18 221 lb 9 oz (100.5 kg)      ASSESSMENT AND PLAN:   1  CAD   Mild by cath in 2011  Myovue in march 2017 showed no significant ischemia   LVEF normal The pt denies symptoms       2.  HTN   BP is OK   Follow    3.  Cramps  Occasional at night  GOod peripheral pulses     4  Preop eval   From a cardiac standpoint I think the pt is at low risk for a major cardiac event   OK to proceed with surgery without furhter testing    Signed, Dorris Carnes, MD  09/09/2018 11:06 AM    [facility] [place], [place], [place]  [address] Phone: [phone]; Fax: [phone]

## 2018-09-10 ENCOUNTER — Other Ambulatory Visit: Payer: Self-pay | Admitting: Internal Medicine

## 2018-09-13 DIAGNOSIS — G894 Chronic pain syndrome: Secondary | ICD-10-CM | POA: Diagnosis not present

## 2018-09-13 DIAGNOSIS — M48061 Spinal stenosis, lumbar region without neurogenic claudication: Secondary | ICD-10-CM | POA: Diagnosis not present

## 2018-09-27 ENCOUNTER — Ambulatory Visit: Payer: Medicare Other | Admitting: Internal Medicine

## 2018-09-27 ENCOUNTER — Other Ambulatory Visit: Payer: Self-pay | Admitting: Internal Medicine

## 2018-11-06 ENCOUNTER — Ambulatory Visit (INDEPENDENT_AMBULATORY_CARE_PROVIDER_SITE_OTHER): Payer: Medicare Other | Admitting: Family Medicine

## 2018-11-06 DIAGNOSIS — C61 Malignant neoplasm of prostate: Secondary | ICD-10-CM

## 2018-11-06 MED ORDER — LEUPROLIDE ACETATE (6 MONTH) 45 MG IM KIT
45.0000 mg | PACK | Freq: Once | INTRAMUSCULAR | Status: AC
  Administered 2018-11-06: 45 mg via INTRAMUSCULAR

## 2018-11-06 NOTE — Progress Notes (Signed)
Lupron IM Injection   Due to Prostate Cancer patient is present today for a Lupron Injection.  Medication: Lupron 6 month Dose: 45 mg  Location: right upper outer buttocks Lot: [id number] Exp: 01/13/2021  Patient tolerated well, no complications were noted  Performed by: Elberta Leatherwood, CMA  Follow up: 6 month Lupron

## 2018-11-07 ENCOUNTER — Telehealth: Payer: Self-pay | Admitting: Urology

## 2018-11-07 LAB — PSA: Prostate Specific Ag, Serum: <0.1 ng/mL (ref 0.0–4.0)

## 2018-11-07 NOTE — Telephone Encounter (Signed)
Patient Notified and voiced understanding. Patient is concerned because he is having urinary incontinence and would like to know if there is another medication he can try. He tried Myrbetriq and it is not doing any good. Please advise

## 2018-11-07 NOTE — Telephone Encounter (Signed)
Pt would like results of husband's PSA from yesterday.  Albert Wade [phone]

## 2018-11-08 NOTE — Telephone Encounter (Signed)
At last visit, this patient was not emptying his bladder completely.  He was actually supposed to have a PVR at the time of his last Lupron.  Please have him come in for a PVR to ensure that he is emptying.  If his PVR is minimal, we can try adding oxybutynin with Myrbetriq 50 mg for optimization.  If this fails, he needs to come in to have further discussion.  Hollice Espy, MD

## 2018-11-08 NOTE — Telephone Encounter (Signed)
Patient notified and is scheduled to have PVR.

## 2018-11-09 ENCOUNTER — Ambulatory Visit: Payer: Self-pay

## 2018-11-13 ENCOUNTER — Ambulatory Visit: Payer: Self-pay

## 2018-11-27 ENCOUNTER — Other Ambulatory Visit: Payer: Self-pay | Admitting: Internal Medicine

## 2018-12-03 ENCOUNTER — Ambulatory Visit (INDEPENDENT_AMBULATORY_CARE_PROVIDER_SITE_OTHER): Payer: Medicare Other

## 2018-12-03 ENCOUNTER — Encounter: Payer: Self-pay | Admitting: Podiatry

## 2018-12-03 ENCOUNTER — Ambulatory Visit: Payer: Medicare Other | Admitting: Podiatry

## 2018-12-03 DIAGNOSIS — M2012 Hallux valgus (acquired), left foot: Secondary | ICD-10-CM | POA: Diagnosis not present

## 2018-12-03 DIAGNOSIS — M2011 Hallux valgus (acquired), right foot: Secondary | ICD-10-CM

## 2018-12-03 DIAGNOSIS — M778 Other enthesopathies, not elsewhere classified: Secondary | ICD-10-CM

## 2018-12-03 DIAGNOSIS — M779 Enthesopathy, unspecified: Secondary | ICD-10-CM

## 2018-12-03 NOTE — Progress Notes (Signed)
He presents today concerned about a painful first metatarsophalangeal joint of the right foot.  Objective: Vital signs are stable alert and oriented x3.  Pulses are palpable.  Neurologic sensorium is intact.  Degenerative flexors are intact.  Muscle is normal symmetrical bilateral.  Orthopedic evaluation demonstrates all joints distal ankle full range of motion no crepitation.  Cutaneous evaluation of straight all joints distal ankle full range of motion without crepitation.  Cutaneous evaluation demonstrates thick yellow dystrophic-like mycotic nail hallux left no open lesions or wounds.  Severe HAV deformity and hammertoe deformities flexible on the right foot none on the left.  Assessment capsulitis painful bunion deformity right.  Plan: After sterile Betadine skin prep injected 20 mg Kenalog 5 numbers Marcaine point maximal tenderness of the right first metatarsophalangeal joint surrounding it.  Did not inject into the joint itself.

## 2019-01-08 DIAGNOSIS — Z5181 Encounter for therapeutic drug level monitoring: Secondary | ICD-10-CM | POA: Diagnosis not present

## 2019-01-08 DIAGNOSIS — M48061 Spinal stenosis, lumbar region without neurogenic claudication: Secondary | ICD-10-CM | POA: Diagnosis not present

## 2019-01-08 DIAGNOSIS — Z79891 Long term (current) use of opiate analgesic: Secondary | ICD-10-CM | POA: Diagnosis not present

## 2019-01-08 DIAGNOSIS — G894 Chronic pain syndrome: Secondary | ICD-10-CM | POA: Diagnosis not present

## 2019-02-13 ENCOUNTER — Ambulatory Visit: Payer: Medicare Other | Admitting: Urology

## 2019-02-13 ENCOUNTER — Encounter: Payer: Self-pay | Admitting: Urology

## 2019-02-13 VITALS — BP 145/63 | HR 72 | Ht 70.0 in | Wt 209.6 lb

## 2019-02-13 DIAGNOSIS — R339 Retention of urine, unspecified: Secondary | ICD-10-CM | POA: Diagnosis not present

## 2019-02-13 DIAGNOSIS — R350 Frequency of micturition: Secondary | ICD-10-CM

## 2019-02-13 DIAGNOSIS — N3941 Urge incontinence: Secondary | ICD-10-CM | POA: Diagnosis not present

## 2019-02-13 DIAGNOSIS — C61 Malignant neoplasm of prostate: Secondary | ICD-10-CM | POA: Diagnosis not present

## 2019-02-13 LAB — BLADDER SCAN AMB NON-IMAGING: Scan Result: 1

## 2019-02-13 MED ORDER — MIRABEGRON ER 50 MG PO TB24: 50.0000 mg | ORAL_TABLET | Freq: Every day | ORAL | 11 refills | Status: DC

## 2019-02-13 NOTE — Progress Notes (Signed)
02/13/2019 9:49 AM   Albert Wade 20-May-1946 [id number]  Referring provider: Biagio Borg, MD [address] [place], [place] [address]  Chief Complaint  Patient presents with  . Prostate Cancer    HPI: Albert Wade is a 73 yo M with a history of BPH and high risk prostate cancer status post IMRT/Lupron presents today for a routine f/u. He is accompanied by his wife.   He was diagnosed with prostate cancer in 11/2016. PSA at the time of biopsy was 17.6. He has 6 of 12 cores positive. 2 of the cores of Gleason 4+4 = 8 disease. 3of the cores have Gleason 4+3 = 7 disease. One core was Gleason 3+3 = 6 disease. All cores that were positive ranged from 50 to 100% volume disease. TRUS vol 21 g.  He completedIMRT as of 03/2017.  Recent PSAundetectable as of 11/06/2018.  His PVR from 08/01/2018 was 250 mL. He has no history of UTI or retention. PVR is 1 mL .   His last Lupron injection was on 11/06/2018. He was started oxybutynin with Myrbetriq 50 mg for optimization. He discontinued oxybutynin due to some issues with mental status changes.  He also discontinued Myrbetriq 50 mg secondary to elevated PVRs.  He reports of urinary urgency with multiple episodes of urge incontinence daily which occurs about 6-8x a day. He denies a weak stream.  No urinary hesitancy or sensation of incomplete emptying.  Urinalysis have been unremarkable for infection is contributing factor.  He is very anxious to be dry.  No dysuria or gross hematuria.  His PVR today is 1 mL.  PMH: Past Medical History:  Diagnosis Date  . Acute bronchitis 01/01/2015  . Acute upper respiratory infection 05/19/2016  . Anxiety   . Arthritis    back,wrists,hx. previous fractures  . BPH (benign prostatic hypertrophy)   . BRONCHITIS, CHRONIC 04/29/2008   Qualifier: Diagnosis of  By: Halford Chessman MD, [place]    . CAD 10/16/2009   Qualifier: Diagnosis of  By: Lynden Ang    . CAD (coronary artery disease)    angioplasty   . Chronic back pain   . Chronic bronchitis   . 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 Confusion 04/27/2017  . Depression   . Encounter for preventative adult health care exam with abnormal findings 12/20/2013  . Fever 04/27/2017  . GERD (gastroesophageal reflux disease)   . Gynecomastia 05/12/2015  . H/O hiatal hernia    pts wife states pt does not have   . Heart murmur   . Hypercholesteremia   . Hyperglycemia 05/19/2016  . HYPERLIPIDEMIA 04/29/2008   Qualifier: Diagnosis of  By: Jim Like    . Hypertension   . Impaired glucose tolerance 12/20/2013  . Insomnia 04/27/2017  . Lumbosacral radiculopathy 10/14/2016  . MI (myocardial infarction) ([place])    mid-'90's  . MYOCARDIAL INFARCTION 04/29/2008   Qualifier: History of  By: Jim Like    . OSA (obstructive sleep apnea) 05/13/2011   Split night study 2011:  AHI 21/hr with obstructive and central events.  Placed on cpap and titrated to 13cm with reasonable control, but attempts to increase pressure increased the number of central events.  Medium resmed quattro full face mask used.    . Peripheral neuropathy    peripheral neuropathy  . Phimosis/adherent prepuce 10/21/2013  . Pneumonia   . Prostate cancer ([place])   . Shortness of breath 11/05/2010   Qualifier: Diagnosis of  By: Marilynne Halsted,  RN, BSN, Malachi Bonds    . Sleep apnea    mild-no cpap, unable to tolerate  . Umbilical hernia   . Umbilical hernia without obstruction and without gangrene 05/19/2016  . URI (upper respiratory infection) 04/27/2017  . UTI (urinary tract infection) 12/01/2016  . Varicose veins   . Varicose veins of bilateral lower extremities with other complications 69/62/9528  . Wheezing 01/01/2015    Surgical History: Past Surgical History:  Procedure Laterality Date  . ANGIOPLASTY    . APPENDECTOMY    . BACK SURGERY     x3  . CARDIAC CATHETERIZATION     11'11  . CIRCUMCISION N/A 10/21/2013   Procedure: CIRCUMCISION ADULT;   Surgeon: Bernestine Amass, MD;  Location: [facility];  Service: Urology;  Laterality: N/A;  . CYSTOSCOPY N/A 10/21/2013   Procedure: CYSTOSCOPY FLEXIBLE;  Surgeon: Bernestine Amass, MD;  Location: [facility];  Service: Urology;  Laterality: N/A;  . INSERTION OF MESH N/A 06/17/2016   Procedure: INSERTION OF MESH;  Surgeon: Excell Seltzer, MD;  Location: [facility];  Service: General;  Laterality: N/A;  . UMBILICAL HERNIA REPAIR N/A 06/17/2016   Procedure: REPAIR UMBILICAL HERNIA WITH MESH;  Surgeon: Excell Seltzer, MD;  Location: [facility];  Service: General;  Laterality: N/A;  . WRIST SURGERY Right     Home Medications:  Allergies as of 02/13/2019      Reactions   Seroquel [quetiapine] Hives      Medication List       Accurate as of February 13, 2019  9:49 AM. Always use your most recent med list.        ALPRAZolam 1 MG tablet Commonly known as:  XANAX Take 1 tablet by mouth 2 (two) times daily.   aspirin 81 MG tablet Take 81 mg by mouth daily.   DULoxetine 30 MG capsule Commonly known as:  CYMBALTA Take 30 mg by mouth daily.   DULoxetine 60 MG capsule Commonly known as:  CYMBALTA   furosemide 40 MG tablet Commonly known as:  LASIX TAKE 1 TABLET (40 MG) ON DAY 1, THEN TAKE 1.5 TABLETS (60 MG) ON DAY 2, THEN TAKE 2 TABLETS (80 MG) ON DAY 3, THEN GOBACK TO DAY 1   methadone 10 MG tablet Commonly known as:  DOLOPHINE Take 10 mg by mouth 4 (four) times daily.   mirabegron ER 50 MG Tb24 tablet Commonly known as:  MYRBETRIQ Take 1 tablet (50 mg total) by mouth daily.   oxyCODONE-acetaminophen 10-325 MG tablet Commonly known as:  PERCOCET Take 5 tablets by mouth daily.   potassium chloride 10 MEQ tablet Commonly known as:  K-DUR TAKE 2 TABLETS BY MOUTH PER DAY ON DAYS TAKING FUROSEMIDE   PROAIR HFA 108 (90 Base) MCG/ACT inhaler Generic drug:  albuterol INHALE 1 TO 2 PUFFS BY MOUTH INTO THE LUNGS EVERY 6 HOURS AS NEEDED FOR WHEEZING OR SHORTNESS OF BREATH   simvastatin 80 MG  tablet Commonly known as:  ZOCOR Take 1 tablet (80 mg total) by mouth every evening.       Allergies:  Allergies  Allergen Reactions  . Seroquel [Quetiapine] Hives    Family History: Family History  Problem Relation Age of Onset  . Prostate cancer Father   . Emphysema Father   . Hyperlipidemia Father   . Hyperlipidemia Mother     Social History:  reports that he quit smoking about 23 years ago. His smoking use included cigarettes. He has a 20.00 pack-year smoking history. He has never  used smokeless tobacco. He reports that he does not drink alcohol or use drugs.  ROS: UROLOGY Frequent Urination?: Yes Hard to postpone urination?: Yes Burning/pain with urination?: No Get up at night to urinate?: Yes Leakage of urine?: Yes Urine stream starts and stops?: Yes Trouble starting stream?: No Do you have to strain to urinate?: No Blood in urine?: No Urinary tract infection?: No Sexually transmitted disease?: No Injury to kidneys or bladder?: No Painful intercourse?: No Weak stream?: No Erection problems?: No Penile pain?: No  Gastrointestinal Nausea?: No Vomiting?: No Indigestion/heartburn?: No Diarrhea?: No Constipation?: Yes  Constitutional Fever: No Night sweats?: No Weight loss?: No Fatigue?: No  Skin Skin rash/lesions?: No Itching?: No  Eyes Blurred vision?: No Double vision?: No  Ears/Nose/Throat Sore throat?: No Sinus problems?: No  Hematologic/Lymphatic Swollen glands?: No Easy bruising?: No  Cardiovascular Leg swelling?: Yes Chest pain?: No  Respiratory Cough?: No Shortness of breath?: No  Endocrine Excessive thirst?: No  Musculoskeletal Back pain?: Yes Joint pain?: Yes  Neurological Headaches?: No Dizziness?: No  Psychologic Depression?: Yes Anxiety?: No  Physical Exam: BP (!) 145/63 (BP Location: Left Arm, Patient Position: Sitting, Cuff Size: Normal)   Pulse 72   Ht 5\' 10"  (1.778 m)   Wt 209 lb 9.6 oz (95.1 kg)    BMI 30.07 kg/m   Constitutional:  Alert and oriented, No acute distress.  Accompanied by wife today. HEENT: [place] AT, moist mucus membranes.  Trachea midline, no masses. Cardiovascular: No clubbing, cyanosis, or edema. Respiratory: Normal respiratory effort, no increased work of breathing. Skin: No rashes, bruises or suspicious lesions. Neurologic: Grossly intact, no focal deficits, moving all 4 extremities. Psychiatric: Normal mood and affect.  Laboratory Data:  Pertinent Imaging: Results for orders placed or performed in visit on 02/13/19  Bladder Scan (Post Void Residual) in office  Result Value Ref Range   Scan Result 1    Assessment & Plan:    1. Prostate cancer [facility]) Personal history of high risk prostate cancer status post IMRT completed 03/2017 Most recent PSA from 11/06/2018 was undetectable  His last Lupron injection was on 11/06/2018, due again 5/20 Continue to encourage calcium/vitamin D for bone health Recommend 3 years of ADT, through 2021  2. 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  We discussed the option today of pursuing urodynamics to help elicit how best to treat his severe urinary symptoms.  We discussed the procedure itself at length today including risk and benefits. Pt agreed with treatment plan Referral sent to [facility] If there is no evidence of outlet obstruction, likely consider treatments for refractory OAB including Botox versus PTNS.     3.  History of incomplete bladder emptying PVR today 1 mL, voiding well   Return for 6 months from last Lupron injecion (11/06/2018) and  MD visit for urodynamic results .  [facility] [address], [place], [place] [address] [phone]  I, Lucas Mallow, am acting as a scribe for Dr. Hollice Espy,  I have reviewed the above documentation for accuracy and completeness, and I agree with the above.   Hollice Espy, MD

## 2019-02-23 ENCOUNTER — Other Ambulatory Visit: Payer: Self-pay | Admitting: Internal Medicine

## 2019-03-01 ENCOUNTER — Telehealth: Payer: Self-pay | Admitting: Urology

## 2019-03-01 NOTE — Telephone Encounter (Signed)
PA FOR LUPRON APPROVED [id number] 03-01-19 THRU 02-29-20 03-01-19 Albert Wade

## 2019-03-13 DIAGNOSIS — R3915 Urgency of urination: Secondary | ICD-10-CM | POA: Diagnosis not present

## 2019-03-13 DIAGNOSIS — R350 Frequency of micturition: Secondary | ICD-10-CM | POA: Diagnosis not present

## 2019-03-15 ENCOUNTER — Other Ambulatory Visit: Payer: Self-pay | Admitting: Urology

## 2019-03-20 ENCOUNTER — Other Ambulatory Visit: Payer: Self-pay

## 2019-03-20 ENCOUNTER — Telehealth (INDEPENDENT_AMBULATORY_CARE_PROVIDER_SITE_OTHER): Payer: Medicare Other | Admitting: Urology

## 2019-03-20 DIAGNOSIS — N3281 Overactive bladder: Secondary | ICD-10-CM | POA: Diagnosis not present

## 2019-03-20 DIAGNOSIS — C61 Malignant neoplasm of prostate: Secondary | ICD-10-CM | POA: Diagnosis not present

## 2019-03-20 DIAGNOSIS — M6289 Other specified disorders of muscle: Secondary | ICD-10-CM

## 2019-03-20 DIAGNOSIS — N3941 Urge incontinence: Secondary | ICD-10-CM

## 2019-03-20 MED ORDER — SOLIFENACIN SUCCINATE 10 MG PO TABS: 10.0000 mg | ORAL_TABLET | Freq: Every day | ORAL | 0 refills | Status: DC

## 2019-03-20 NOTE — Patient Instructions (Signed)
Botulinum Toxin Bladder Injection  A botulinum toxin bladder injection is a procedure to treat an overactive bladder. During the procedure, a drug called botulinum toxin is injected into the bladder through a long, thin needle. This drug relaxes the bladder muscles and reduces overactivity. You may need this procedure if your medicines are not working or you cannot take them. The procedure may be repeated as needed. The treatment usually lasts for 6 months. Your health care provider will monitor you to see how well you respond. Tell a health care provider about:  Any allergies you have.  All medicines you are taking, including vitamins, herbs, eye drops, creams, and over-the-counter medicines.  Any problems you or family members have had with anesthetic medicines.  Any blood disorders you have.  Any surgeries you have had.  Any medical conditions you have.  Any previous reactions to a botulinum toxin injection.  Any symptoms of urinary tract infection. These include chills, fever, a burning feeling when passing urine, and needing to pass urine often.  Whether you are pregnant or may be pregnant. What are the risks? Generally this is a safe procedure. However, problems may occur, including:  Not being able to pass urine. If this happens, you may need to have your bladder emptied with a thin tube inserted into your urethra (urinary catheter).  Bleeding.  Urinary tract infection.  Allergic reaction to the botulinum toxin.  Pain or burning when passing urine.  Damage to other structures or organs. What happens before the procedure? Staying hydrated Follow instructions from your health care provider about hydration, which may include:  Up to 2 hours before the procedure - you may continue to drink clear liquids, such as water, clear fruit juice, black coffee, and plain tea. Eating and drinking restrictions Follow instructions from your health care provider about eating and  drinking, which may include:  8 hours before the procedure - stop eating heavy meals or foods, such as meat, fried foods, or fatty foods.  6 hours before the procedure - stop eating light meals or foods, such as toast or cereal.  6 hours before the procedure - stop drinking milk or drinks that contain milk.  2 hours before the procedure - stop drinking clear liquids. Medicines Ask your health care provider about:  Changing or stopping your regular medicines. This is especially important if you are taking diabetes medicines or blood thinners.  Taking medicines such as aspirin and ibuprofen. These medicines can thin your blood. Do not take these medicines unless your health care provider tells you to take them.  Taking over-the-counter medicines, vitamins, herbs, and supplements. General instructions  Plan to have someone take you home from the hospital or clinic.  If you will be going home right after the procedure, plan to have someone with you for 24 hours.  Ask your health care provider what steps will be taken to help prevent infection. These may include: ? Removing hair at the procedure site. ? Washing skin with a germ-killing soap. ? Antibiotic medicine. What happens during the procedure?   You will be asked to empty your bladder.  An IV will be inserted into one of your veins.  You will be given one or more of the following: ? A medicine to help you relax (sedative). ? A medicine to numb the area (local anesthetic). ? A medicine to make you fall asleep (general anesthetic).  A long, thin scope called a cystoscope will be passed into your bladder through the part   more of the following:  ? A medicine to help you relax (sedative).  ? A medicine to numb the area (local anesthetic).  ? A medicine to make you fall asleep (general anesthetic).   A long, thin scope called a cystoscope will be passed into your bladder through the part of the body that carries urine from your bladder (urethra).   The cystoscope will be used to fill your bladder with water.   A long needle will be passed through the cystoscope and into the bladder.   The botulinum toxin will be injected into your bladder. It may be injected into multiple areas of your  bladder.   Your bladder will be emptied, and the cystoscope will be removed.  The procedure may vary among health care providers and hospitals.  What can I expect after procedure?  After your procedure, it is common to have:   Blood-tinged urine.   Burning or soreness when you pass urine.  Follow these instructions at home:  Medicines   Take over-the-counter and prescription medicines only as told by your health care provider.   If you were prescribed an antibiotic medicine, take it as told by your health care provider. Do not stop taking the antibiotic even if you start to feel better.  General instructions     Do not drive for 24 hours if you were given a sedative during your procedure.   Drink enough fluid to keep your urine pale yellow.   Return to your normal activities as told by your health care provider. Ask your health care provider what activities are safe for you.   Keep all follow-up visits as told by your health care provider. This is important.  Contact a health care provider if you have:   A fever or chills.   Blood-tinged urine for more than one day after your procedure.   Worsening pain or burning when you pass urine.   Pain or burning when passing urine for more than two days after your procedure.   Trouble emptying your bladder.  Get help right away if you:   Have bright red blood in your urine.   Are unable to pass urine.  Summary   A botulinum toxin bladder injection is a procedure to treat an overactive bladder.   This is generally a very safe procedure. However, problems may occur, including not being able to pass urine, bleeding, infection, pain, and allergic reactions to medicines.   You will be told when to stop eating and drinking, and what medicines to change or stop. Follow instructions carefully.   After the procedure, it is common to have blood in urine and to have soreness or burning when passing urine.   Contact a health care provider if you have a fever, have  blood in urine for more than a few days, or have trouble passing urine. Get help right away if you have bright red blood in the urine, or if you are unable to pass urine.  This information is not intended to replace advice given to you by your health care provider. Make sure you discuss any questions you have with your health care provider.  Document Released: 09/02/2015 Document Revised: 06/22/2018 Document Reviewed: 06/22/2018  Elsevier Interactive Patient Education  2019 [employer]

## 2019-03-20 NOTE — Progress Notes (Signed)
Virtual Visit via Telephone Note  I connected with Albert Wade on 03/20/19 at  9:15 AM EDT by telephone and verified that I am speaking with the correct person using two identifiers.   I discussed the limitations, risks, security and privacy concerns of performing an evaluation and management service by telephone and the availability of in person appointments. I also discussed with the patient that there may be a patient responsible charge related to this service. The patient expressed understanding and agreed to proceed.   History of Present Illness: 73 year old male with a personal history of high risk prostate cancer and refractory OAB/urge incontinence who was available today for a telephone encounter to follow-up on his most recent urodynamics results.  In terms of his prostate cancer history, He was diagnosed with prostate cancer in 11/2016. PSA at the time of biopsy was 17.6. He has 6 of 12 cores positive. 2 of the cores of Gleason 4+4 = 8 disease. 3of the cores have Gleason 4+3 = 7 disease. One core was Gleason 3+3 = 6 disease. All cores that were positive ranged from 50 to 100% volume disease. TRUS vol 21 g.  He completedIMRT as of 03/2017.  He is on lupron x 3 years, last injection 10/2018.  Last recent PSA on 10/2018 undetectable.  Tolerating Lupron well.  In the interim, he has been having increasing urinary urgency and frequency as well as urge incontinence multiple times a day.  He has to wear a diaper which is very troublesome to him.  He underwent urodynamics testing at [facility] on 03/13/2019.  This showed no evidence of outlet obstruction, adequate bladder capacity, with multiple unstable contractions, detrusor instability, and evidence of EMG activation during voiding.  There is no clear evidence of outlet obstruction.  His first sensation was at 146 cc, first desire to void 270 cc, max capacity 300 cc.  He was able to void with a flow rate of 10 mL's per second and  detrusor pressure of 60 cm of water at peak flow.  On video portion of urodynamics, he did have mild trabeculation.  No reflux appreciated.  Patient notes that he does have spinal issues and is being evaluated for possible surgery for issues with L5-S1.  He is previously failed oxybutynin and Myrbetriq 50 mg.  He reports that his urinary issues did not start until after radiation.   Assessment and Plan:  1. OAB (overactive bladder) Urodynamics was reviewed by me extensively today with independent interpretation It does appear that he has a component of severe overactivity with detrusor instability without obvious outflow obstruction He is previously tried Myrbetriq and oxybutynin unsuccessfully We extensive discussion today about his options for refractory OAB symptoms.  We discussed alternatives including Botox and PTNS at length including risk and benefits of each.  He is more interested in Botox but understands at this time due to our inability to accommodate elective procedures, this would need to be held for the time being and can be considered once we are cleared to pursue elective procedures again.  We did discuss the injection procedure itself along with risks and benefits including risk of urinary retention approximately 5%, infection, failure to resolve his issues, need for further interventions.  I provided him with information about Botox via MyChart.  He will return in about 2 months when he is due for his Lupron to discuss this further potentially schedule this procedure.   He would like to try a third medication in the interim, have  called Vesicare 10 mg into his pharmacy.  He will let us know if this is cost prohibitive.  Discussed possible side effects including dry eyes dry mouth and constipation.  2. Urge incontinence As above  3. Pelvic floor dysfunction Patient does have engagement of his pelvic floor musculature with voiding consistent with probable pelvic floor  dysfunction  He would likely benefit from physical therapy for this.  A good place in order with the understanding that this will likely not be implemented until after social distancing has been lifted.  4. Prostate cancer [facility]) Personal history of high risk prostate cancer currently on Lupron until 2021, will be due for next injection 04/2020  We will check PSA at this time.  Patient is agreeable this plan.   Follow Up Instructions: F/u in may as scheduled for PSA/ Lupron/ further discussion of botox   I discussed the assessment and treatment plan with the patient. The patient was provided an opportunity to ask questions and all were answered. The patient agreed with the plan and demonstrated an understanding of the instructions.   The patient was advised to call back or seek an in-person evaluation if the symptoms worsen or if the condition fails to improve as anticipated.  I provided 23 minutes of non-face-to-face time during this encounter.   Hollice Espy, MD

## 2019-03-21 ENCOUNTER — Other Ambulatory Visit: Payer: Self-pay | Admitting: Internal Medicine

## 2019-04-15 DIAGNOSIS — K5909 Other constipation: Secondary | ICD-10-CM | POA: Diagnosis not present

## 2019-04-15 DIAGNOSIS — M48061 Spinal stenosis, lumbar region without neurogenic claudication: Secondary | ICD-10-CM | POA: Diagnosis not present

## 2019-04-15 DIAGNOSIS — Z79891 Long term (current) use of opiate analgesic: Secondary | ICD-10-CM | POA: Diagnosis not present

## 2019-04-15 DIAGNOSIS — G894 Chronic pain syndrome: Secondary | ICD-10-CM | POA: Diagnosis not present

## 2019-05-01 ENCOUNTER — Other Ambulatory Visit: Payer: Self-pay

## 2019-05-02 ENCOUNTER — Other Ambulatory Visit: Payer: Self-pay

## 2019-05-02 ENCOUNTER — Inpatient Hospital Stay: Payer: Medicare Other | Attending: Radiation Oncology | Admitting: *Deleted

## 2019-05-02 DIAGNOSIS — R9721 Rising PSA following treatment for malignant neoplasm of prostate: Secondary | ICD-10-CM | POA: Diagnosis not present

## 2019-05-02 DIAGNOSIS — C61 Malignant neoplasm of prostate: Secondary | ICD-10-CM

## 2019-05-02 LAB — PSA: Prostatic Specific Antigen: <0.01 ng/mL (ref 0.00–4.00)

## 2019-05-08 ENCOUNTER — Encounter: Payer: Self-pay | Admitting: Urology

## 2019-05-08 ENCOUNTER — Ambulatory Visit: Payer: Medicare Other | Admitting: Urology

## 2019-05-08 ENCOUNTER — Other Ambulatory Visit: Payer: Self-pay

## 2019-05-08 VITALS — BP 149/73 | HR 72 | Ht 70.0 in | Wt 216.0 lb

## 2019-05-08 DIAGNOSIS — R350 Frequency of micturition: Secondary | ICD-10-CM | POA: Diagnosis not present

## 2019-05-08 DIAGNOSIS — M6289 Other specified disorders of muscle: Secondary | ICD-10-CM

## 2019-05-08 DIAGNOSIS — C61 Malignant neoplasm of prostate: Secondary | ICD-10-CM

## 2019-05-08 LAB — BLADDER SCAN AMB NON-IMAGING

## 2019-05-08 MED ORDER — LEUPROLIDE ACETATE (6 MONTH) 45 MG IM KIT
45.0000 mg | PACK | Freq: Once | INTRAMUSCULAR | Status: AC
  Administered 2019-05-08: 45 mg via INTRAMUSCULAR

## 2019-05-08 NOTE — Progress Notes (Signed)
05/08/2019 9:31 AM   Albert Wade July 03, 1946 [id number]  Referring provider: Biagio Borg, MD [address] [place], [place] [address]  Chief Complaint  Patient presents with  . Prostate Cancer    Follow up    HPI: 73 year old male with a personal history of prostate cancer and OAB who returns today to discuss his urinary symptoms as well as Lupron injection.  Prostate cancer: He was diagnosed with prostate cancer in 11/2016. PSA at the time of biopsy was 17.6. He has 6 of 12 cores positive. 2 of the cores of Gleason 4+4 = 8 disease. 3of the cores have Gleason 4+3 = 7 disease. One core was Gleason 3+3 = 6 disease. All cores that were positive ranged from 50 to 100% volume disease. TRUS vol 21 g.  He completedIMRT as of 03/2017.  Recent PSAundetectable as of5/2020  He is currently on Lupron which she continues today, plan to continue this through 04/2020.  Tolerating Lupron well.  OAB/ Pelvic floor dysfunction He has a personal history of severe urinary overactivity and urge incontinence.  He is tried multiple medications including Myrbetriq 25, 50 mg, oxybutynin and most recently Vesicare without any dramatic improvement in his severe urinary symptoms.  He is interested in pursuing Botox.  He did undergo urodynamics which shows engagement of his pelvic floor musculature during voiding consistent with pelvic floor dysfunction but given COVID-19 pandemic, physical therapy has been unavailable to him.  Please see previous notes for details.   PMH: Past Medical History:  Diagnosis Date  . Acute bronchitis 01/01/2015  . Acute upper respiratory infection 05/19/2016  . Anxiety   . Arthritis    back,wrists,hx. previous fractures  . BPH (benign prostatic hypertrophy)   . BRONCHITIS, CHRONIC 04/29/2008   Qualifier: Diagnosis of  By: Halford Chessman MD, [place]    . CAD 10/16/2009   Qualifier: Diagnosis of  By: Lynden Ang    . CAD (coronary artery disease)    angioplasty   . Chronic back pain   . Chronic bronchitis   . 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 Confusion 04/27/2017  . Depression   . Encounter for preventative adult health care exam with abnormal findings 12/20/2013  . Fever 04/27/2017  . GERD (gastroesophageal reflux disease)   . Gynecomastia 05/12/2015  . H/O hiatal hernia    pts wife states pt does not have   . Heart murmur   . Hypercholesteremia   . Hyperglycemia 05/19/2016  . HYPERLIPIDEMIA 04/29/2008   Qualifier: Diagnosis of  By: Jim Like    . Hypertension   . Impaired glucose tolerance 12/20/2013  . Insomnia 04/27/2017  . Lumbosacral radiculopathy 10/14/2016  . MI (myocardial infarction) ([place])    mid-'90's  . MYOCARDIAL INFARCTION 04/29/2008   Qualifier: History of  By: Jim Like    . OSA (obstructive sleep apnea) 05/13/2011   Split night study 2011:  AHI 21/hr with obstructive and central events.  Placed on cpap and titrated to 13cm with reasonable control, but attempts to increase pressure increased the number of central events.  Medium resmed quattro full face mask used.    . Peripheral neuropathy    peripheral neuropathy  . Phimosis/adherent prepuce 10/21/2013  . Pneumonia   . Prostate cancer ([place])   . Shortness of breath 11/05/2010   Qualifier: Diagnosis of  By: Marilynne Halsted, RN, BSN, Jacquelyn    . Sleep apnea    mild-no cpap, unable to tolerate  . Umbilical hernia   .  Umbilical hernia without obstruction and without gangrene 05/19/2016  . URI (upper respiratory infection) 04/27/2017  . UTI (urinary tract infection) 12/01/2016  . Varicose veins   . Varicose veins of bilateral lower extremities with other complications 16/09/9603  . Wheezing 01/01/2015    Surgical History: Past Surgical History:  Procedure Laterality Date  . ANGIOPLASTY    . APPENDECTOMY    . BACK SURGERY     x3  . CARDIAC CATHETERIZATION     11'11  . CIRCUMCISION N/A 10/21/2013   Procedure: CIRCUMCISION ADULT;   Surgeon: Bernestine Amass, MD;  Location: [facility];  Service: Urology;  Laterality: N/A;  . CYSTOSCOPY N/A 10/21/2013   Procedure: CYSTOSCOPY FLEXIBLE;  Surgeon: Bernestine Amass, MD;  Location: [facility];  Service: Urology;  Laterality: N/A;  . INSERTION OF MESH N/A 06/17/2016   Procedure: INSERTION OF MESH;  Surgeon: Excell Seltzer, MD;  Location: [facility];  Service: General;  Laterality: N/A;  . UMBILICAL HERNIA REPAIR N/A 06/17/2016   Procedure: REPAIR UMBILICAL HERNIA WITH MESH;  Surgeon: Excell Seltzer, MD;  Location: [facility];  Service: General;  Laterality: N/A;  . WRIST SURGERY Right     Home Medications:  Allergies as of 05/08/2019      Reactions   Seroquel [quetiapine] Hives      Medication List       Accurate as of May 08, 2019 11:59 PM. If you have any questions, ask your nurse or doctor.        ALPRAZolam 1 MG tablet Commonly known as:  XANAX Take 1 tablet by mouth 2 (two) times daily.   aspirin 81 MG tablet Take 81 mg by mouth daily.   DULoxetine 30 MG capsule Commonly known as:  CYMBALTA Take 30 mg by mouth daily.   DULoxetine 60 MG capsule Commonly known as:  CYMBALTA   furosemide 40 MG tablet Commonly known as:  LASIX TAKE 1 TABLET (40 MG) ON DAY 1, THEN TAKE 1.5 TABLETS (60 MG) ON DAY 2, THEN TAKE 2 TABLETS (80 MG) ON DAY 3, THEN GOBACK TO DAY 1   methadone 10 MG tablet Commonly known as:  DOLOPHINE Take 10 mg by mouth 4 (four) times daily.   mirabegron ER 50 MG Tb24 tablet Commonly known as:  MYRBETRIQ Take 1 tablet (50 mg total) by mouth daily.   oxyCODONE-acetaminophen 10-325 MG tablet Commonly known as:  PERCOCET Take 5 tablets by mouth daily.   potassium chloride 10 MEQ tablet Commonly known as:  K-DUR TAKE 2 TABLETS BY MOUTH PER DAY ON DAYS TAKING FUROSEMIDE   ProAir HFA 108 (90 Base) MCG/ACT inhaler Generic drug:  albuterol INHALE 1 TO 2 PUFFS BY MOUTH INTO THE LUNGS EVERY 6 HOURS AS NEEDED FOR WHEEZING OR SHORTNESS OF BREATH   simvastatin  80 MG tablet Commonly known as:  ZOCOR TAKE 1 TABLET BY MOUTH EVERY EVENING   solifenacin 10 MG tablet Commonly known as:  VESICARE Take 1 tablet (10 mg total) by mouth daily.   traZODone 50 MG tablet Commonly known as:  DESYREL TAKE 1/2 TO 1 TABLET BY MOUTH AT BEDTIMEAS NEEDED FOR SLEEP       Allergies:  Allergies  Allergen Reactions  . Seroquel [Quetiapine] Hives    Family History: Family History  Problem Relation Age of Onset  . Prostate cancer Father   . Emphysema Father   . Hyperlipidemia Father   . Hyperlipidemia Mother     Social History:  reports that he quit smoking about  23 years ago. His smoking use included cigarettes. He has a 20.00 pack-year smoking history. He has never used smokeless tobacco. He reports that he does not drink alcohol or use drugs.  ROS: UROLOGY Frequent Urination?: Yes Hard to postpone urination?: Yes Burning/pain with urination?: No Get up at night to urinate?: Yes Leakage of urine?: No Urine stream starts and stops?: No Trouble starting stream?: No Do you have to strain to urinate?: No Blood in urine?: No Urinary tract infection?: No Sexually transmitted disease?: No Injury to kidneys or bladder?: No Painful intercourse?: No Weak stream?: No Erection problems?: Yes Penile pain?: No  Gastrointestinal Nausea?: No Vomiting?: No Indigestion/heartburn?: No Diarrhea?: No Constipation?: No  Constitutional Fever: No Night sweats?: No Weight loss?: No Fatigue?: No  Skin Skin rash/lesions?: No Itching?: No  Eyes Blurred vision?: No Double vision?: No  Ears/Nose/Throat Sore throat?: No Sinus problems?: No  Hematologic/Lymphatic Swollen glands?: No Easy bruising?: No  Cardiovascular Leg swelling?: No Chest pain?: No  Respiratory Cough?: No Shortness of breath?: No  Endocrine Excessive thirst?: No  Musculoskeletal Back pain?: No Joint pain?: No  Neurological Headaches?: No Dizziness?: No   Psychologic Depression?: No Anxiety?: No  Physical Exam: BP (!) 149/73   Pulse 72   Ht 5\' 10"  (1.778 m)   Wt 216 lb (98 kg)   BMI 30.99 kg/m   Constitutional:  Alert and oriented, No acute distress. HEENT: [place] AT, moist mucus membranes.  Trachea midline, no masses. Cardiovascular: No clubbing, cyanosis, or edema. Respiratory: Normal respiratory effort, no increased work of breathing. GI: Abdomen is soft, nontender, nondistended, no abdominal masses Skin: No rashes, bruises or suspicious lesions. Neurologic: Grossly intact, no focal deficits, moving all 4 extremities. Psychiatric: Normal mood and affect.  Laboratory Data: Lab Results  Component Value Date   WBC 4.8 02/07/2018   HGB 11.5 (L) 02/07/2018   HCT 33.5 (L) 02/07/2018   MCV 89.4 02/07/2018   PLT 180.0 02/07/2018    Lab Results  Component Value Date   CREATININE 0.92 02/07/2018    Lab Results  Component Value Date   HGBA1C 6.1 02/07/2018   Component     Latest Ref Rng & Units 09/21/2017 03/29/2018 05/02/2019  Prostatic Specific Antigen     0.00 - 4.00 ng/mL 0.06 0.01 <0.01    Pertinent Imaging: Results for orders placed or performed in visit on 05/08/19  Bladder Scan (Post Void Residual) in office  Result Value Ref Range   Scan Result 44ml     Assessment & Plan:    1. Prostate cancer ([place]) PSA undetectable Given high risk disease, continue for at least 3 years to 2021 Reviewed bone health recommendations again today and encouraged weightbearing exercise as possible - Leuprolide Acetate (6 Month) (LUPRON) injection 45 mg  2. Urinary frequency/ OAB/ Urge incontinence Severe refractory OAB symptoms At this point, he is interested in pursuing Botox as per our previous discussions He is previously given information and he watched a video today We discussed the risk of the procedure including risk of bleeding, infection, demonstrating structures, urinary retention and about 5% of patients requiring need for  self cath He understands all this and is willing to proceed as planned, will schedule for outpatient 100 mg Botox injection and possibly transition to the office based procedure if he tolerates it well with good result - Bladder Scan (Post Void Residual) in office  3. Pelvic floor dysfunction Urodynamics with evidence of pelvic floor dysfunction, given that offices are starting to have  back up again will place referral for pelvic floor therapy We discussed the importance of learning how to coordinate sphincter with urination   Hollice Espy, MD  [facility] [address] [place], [place] [address] [phone]  I spent 25 min with this patient of which greater than 50% was spent in counseling and coordination of care with the patient.

## 2019-05-08 NOTE — Progress Notes (Signed)
Lupron IM Injection   Due to Prostate Cancer patient is present today for a Lupron Injection.  Medication: Lupron 6 month Dose:  45mg   Location: left upper outer buttocks Lot: [id number] Exp: 05/2021  Patient tolerated well, no complications were noted  Performed EG:BTDVVO Morris, CMA   Follow up:6 Months

## 2019-05-09 ENCOUNTER — Other Ambulatory Visit: Payer: Self-pay

## 2019-05-09 ENCOUNTER — Encounter: Payer: Self-pay | Admitting: Radiation Oncology

## 2019-05-09 ENCOUNTER — Ambulatory Visit
Admission: RE | Admit: 2019-05-09 | Discharge: 2019-05-09 | Disposition: A | Discharge status: Home / Self Care | Payer: Medicare Other | Source: Ambulatory Visit | Attending: Radiation Oncology | Admitting: Radiation Oncology

## 2019-05-09 VITALS — BP 143/66 | HR 66 | Resp 18 | Wt 213.6 lb

## 2019-05-09 DIAGNOSIS — R351 Nocturia: Secondary | ICD-10-CM | POA: Diagnosis not present

## 2019-05-09 DIAGNOSIS — R350 Frequency of micturition: Secondary | ICD-10-CM | POA: Insufficient documentation

## 2019-05-09 DIAGNOSIS — Z923 Personal history of irradiation: Secondary | ICD-10-CM | POA: Insufficient documentation

## 2019-05-09 DIAGNOSIS — C61 Malignant neoplasm of prostate: Secondary | ICD-10-CM | POA: Insufficient documentation

## 2019-05-09 NOTE — Progress Notes (Signed)
Radiation Oncology Follow up Note  Name: Albert Wade   Date:   05/09/2019 [name]:  [id number] DOB: [birth year]    This 73 y.o. male presents to the clinic today for 2-year follow-up status post IMRT radiation therapy for stage IIb adenocarcinoma the prostate.  REFERRING PROVIDER: Biagio Borg, MD  HPI: Patient is a 73 year old male now about 2 years having completed IMRT radiation therapy for stage IIb adenocarcinoma the prostate Gleason score of 8 (4+ core).  He received prostate and pelvic node radiation.  Initial PSA was 17.6 most recent PSA is less than 0.01 he does have urinary frequency and nocturia x5 is currently being treated by urology and followed.Marland Kitchen  He continues under Lupron therapy.  He is being scheduled for Botox injection to treat his urinary incontinence.  He is currently on myrbetriq and tolerating that well  COMPLICATIONS OF TREATMENT: none  FOLLOW UP COMPLIANCE: keeps appointments   PHYSICAL EXAM:  BP (!) 143/66 (BP Location: Left Arm)   Pulse 66   Resp 18   Wt 213 lb 10 oz (96.9 kg)   BMI 30.65 kg/m  Well-developed well-nourished patient in NAD. HEENT reveals PERLA, EOMI, discs not visualized.  Oral cavity is clear. No oral mucosal lesions are identified. Neck is clear without evidence of cervical or supraclavicular adenopathy. Lungs are clear to A&P. Cardiac examination is essentially unremarkable with regular rate and rhythm without murmur rub or thrill. Abdomen is benign with no organomegaly or masses noted. Motor sensory and DTR levels are equal and symmetric in the upper and lower extremities. Cranial nerves II through XII are grossly intact. Proprioception is intact. No peripheral adenopathy or edema is identified. No motor or sensory levels are noted. Crude visual fields are within normal range.  RADIOLOGY RESULTS: No current films for review  PLAN: Present time patient is doing well under excellent biochemical control of his prostate cancer.  He like to  continue follow-up care with Dr. Erlene Quan and urology I will turn follow-up care over to her be happy to reevaluate her at any time should further radiation be indicated.  I would like to take this opportunity to thank you for allowing me to participate in the care of your patient.Noreene Filbert, MD

## 2019-05-13 DIAGNOSIS — G894 Chronic pain syndrome: Secondary | ICD-10-CM | POA: Diagnosis not present

## 2019-05-13 DIAGNOSIS — M48061 Spinal stenosis, lumbar region without neurogenic claudication: Secondary | ICD-10-CM | POA: Diagnosis not present

## 2019-05-13 DIAGNOSIS — K5909 Other constipation: Secondary | ICD-10-CM | POA: Diagnosis not present

## 2019-05-18 ENCOUNTER — Other Ambulatory Visit: Payer: Self-pay | Admitting: Internal Medicine

## 2019-05-27 ENCOUNTER — Other Ambulatory Visit: Payer: Self-pay | Admitting: Radiology

## 2019-05-27 DIAGNOSIS — N3281 Overactive bladder: Secondary | ICD-10-CM

## 2019-05-27 MED ORDER — ONABOTULINUMTOXINA 100 UNITS IJ SOLR: 100.0000 [IU] | INTRAMUSCULAR | Status: AC

## 2019-05-30 ENCOUNTER — Other Ambulatory Visit: Payer: Self-pay | Admitting: Radiology

## 2019-06-03 ENCOUNTER — Telehealth: Payer: Self-pay

## 2019-06-03 NOTE — Telephone Encounter (Signed)
  Pt followed by Dr. Harrington Challenger for mild CAD, noted on cath in 2011, HTN and HLD. Having low risk procedure. Last OV was 08/2018. I attempted phone call to assess for cardiac symptoms prior to clearance. LVM to call preop clinic back. If no cardiac symptoms, on return phone call, then he can be cleared and can hold ASA x 7 days prior. Will await pt's return call for final clearance.

## 2019-06-03 NOTE — Telephone Encounter (Signed)
   [facility] Pre-operative Risk Assessment    Request for surgical clearance:  1. What type of surgery is being performed? Cystoscopy, bladder botox injections   2. When is this surgery scheduled? TBD   3. What type of clearance is required (medical clearance vs. Pharmacy clearance to hold med vs. Both)? Both  4. Are there any medications that need to be held prior to surgery and how long? Aspirin x 7 days prior to surgery   5. Practice name and name of physician performing surgery? Dr. Caryl Pina [place]/[facility]   6. What is your office phone number [phone]    7.   What is your office fax number [phone]  8.   Anesthesia type (None, local, MAC, general) ? None listed   Albert Wade 06/03/2019, 9:42 AM  _________________________________________________________________   (provider comments below)

## 2019-06-05 ENCOUNTER — Telehealth: Payer: Self-pay | Admitting: Radiology

## 2019-06-05 NOTE — Telephone Encounter (Signed)
OK, he will be due for Lupron in 10/2019.  Please schedule routine f/u with me then for injection/ PSA.  Hollice Espy, MD

## 2019-06-05 NOTE — Telephone Encounter (Signed)
   Primary Cardiologist: Dorris Carnes, MD  Chart reviewed as part of pre-operative protocol coverage. Patient was contacted 06/05/2019 in reference to pre-operative risk assessment for pending surgery as outlined below.  Albert Wade was last seen on 09/07/18 by Dr. Harrington Challenger.  Since that day, Albert Wade has done well w/o anginal symptoms.  Therefore, based on ACC/AHA guidelines, the patient would be at acceptable risk for the planned procedure without further cardiovascular testing.   Ok to hold ASA 7 days prior to procedure if necessary.  I will route this recommendation to the requesting party via Epic fax function and remove from pre-op pool.  Please call with questions.  Lyda Jester, PA-C 06/05/2019, 4:52 PM

## 2019-06-05 NOTE — Telephone Encounter (Signed)
Called patient to discuss scheduling Bladder Botox injections in the OR. Wife states patient has not decided that he wants the procedure and will call back if he decides to proceed.

## 2019-06-07 NOTE — Telephone Encounter (Signed)
App made and mailed to patient ° °Albert Wade °

## 2019-06-12 ENCOUNTER — Ambulatory Visit: Payer: Medicare Other | Admitting: Podiatry

## 2019-06-12 ENCOUNTER — Ambulatory Visit (INDEPENDENT_AMBULATORY_CARE_PROVIDER_SITE_OTHER): Payer: Medicare Other

## 2019-06-12 ENCOUNTER — Other Ambulatory Visit: Payer: Self-pay

## 2019-06-12 ENCOUNTER — Encounter: Payer: Self-pay | Admitting: Podiatry

## 2019-06-12 VITALS — Temp 98.2°F

## 2019-06-12 DIAGNOSIS — M2011 Hallux valgus (acquired), right foot: Secondary | ICD-10-CM | POA: Diagnosis not present

## 2019-06-12 NOTE — Progress Notes (Signed)
He presents today to discuss possibility of bunion surgery right.  States that is really starting to bother me now and I know I may be waiting a little late but I would like to see about getting this fixed.  Objective: Vital signs are stable alert and oriented x3.  Pulses are palpable.  Neurologic sensorium is intact.  Severe venous engorgement and varicosities.  He has a mildly reducible bunion deformity and rigid hammertoe deformities #2 #3 at the right foot.  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  Hammertoe deformities #2 #3 right foot.  Plan: We discussed etiology pathology conservative surgical therapies at this point were going to get medical clearance from Dr. Jenny Reichmann and his cardiologist.  At this point we consented him for an [facility] bunion repair with screw fixation second metatarsal osteotomy third metatarsal osteotomy with double screw fixation.  Hammertoe repair with screw or pins.  Answered all questions regarding these procedures best my ability layman's terms he understands we are waiting for approval on his ability to have anesthesia prior to scheduling surgery.

## 2019-06-12 NOTE — Patient Instructions (Signed)
Pre-Operative Instructions  Congratulations, you have decided to take an important step towards improving your quality of life.  You can be assured that the doctors and staff at [facility] will be with you every step of the way.  Here are some important things you should know:  1. Plan to be at the surgery center/hospital at least 1 (one) hour prior to your scheduled time, unless otherwise directed by the surgical center/hospital staff.  You must have a responsible adult accompany you, remain during the surgery and drive you home.  Make sure you have directions to the surgical center/hospital to ensure you arrive on time. 2. If you are having surgery at [facility] or [facility] hospitals, you will need a copy of your medical history and physical form from your family physician within one month prior to the date of surgery. We will give you a form for your primary physician to complete.  3. We make every effort to accommodate the date you request for surgery.  However, there are times where surgery dates or times have to be moved.  We will contact you as soon as possible if a change in schedule is required.   4. No aspirin/ibuprofen for one week before surgery.  If you are on aspirin, any non-steroidal anti-inflammatory medications (Mobic, Aleve, Ibuprofen) should not be taken seven (7) days prior to your surgery.  You make take Tylenol for pain prior to surgery.  5. Medications - If you are taking daily heart and blood pressure medications, seizure, reflux, allergy, asthma, anxiety, pain or diabetes medications, make sure you notify the surgery center/hospital before the day of surgery so they can tell you which medications you should take or avoid the day of surgery. 6. No food or drink after midnight the night before surgery unless directed otherwise by surgical center/hospital staff. 7. No alcoholic beverages 24-hours prior to surgery.  No smoking 24-hours prior or 24-hours after  surgery. 8. Wear loose pants or shorts. They should be loose enough to fit over bandages, boots, and casts. 9. Don't wear slip-on shoes. Sneakers are preferred. 10. Bring your boot with you to the surgery center/hospital.  Also bring crutches or a walker if your physician has prescribed it for you.  If you do not have this equipment, it will be provided for you after surgery. 11. If you have not been contacted by the surgery center/hospital by the day before your surgery, call to confirm the date and time of your surgery. 12. Leave-time from work may vary depending on the type of surgery you have.  Appropriate arrangements should be made prior to surgery with your employer. 13. Prescriptions will be provided immediately following surgery by your doctor.  Fill these as soon as possible after surgery and take the medication as directed. Pain medications will not be refilled on weekends and must be approved by the doctor. 14. Remove nail polish on the operative foot and avoid getting pedicures prior to surgery. 15. Wash the night before surgery.  The night before surgery wash the foot and leg well with water and the antibacterial soap provided. Be sure to pay special attention to beneath the toenails and in between the toes.  Wash for at least three (3) minutes. Rinse thoroughly with water and dry well with a towel.  Perform this wash unless told not to do so by your physician.  Enclosed: 1 Ice pack (please put in freezer the night before surgery)   1 Hibiclens skin cleaner     Pre-op instructions  If you have any questions regarding the instructions, please do not hesitate to call our office.  [place]: [address], [place], [place] [address] -- [phone]  [place]: [address] [place], [place] [address] -- [phone]  [place]: [address]  [place], [place] [address] -- [phone]  [place]: [address], [address], [place], [place] [address] -- [phone]  Website:  [email or web address] 

## 2019-06-17 ENCOUNTER — Encounter: Payer: Self-pay | Admitting: Internal Medicine

## 2019-06-17 ENCOUNTER — Ambulatory Visit: Payer: Self-pay

## 2019-06-17 ENCOUNTER — Ambulatory Visit (INDEPENDENT_AMBULATORY_CARE_PROVIDER_SITE_OTHER): Payer: Medicare Other | Admitting: Internal Medicine

## 2019-06-17 VITALS — Temp 99.4°F

## 2019-06-17 DIAGNOSIS — R509 Fever, unspecified: Secondary | ICD-10-CM

## 2019-06-17 DIAGNOSIS — E538 Deficiency of other specified B group vitamins: Secondary | ICD-10-CM | POA: Diagnosis not present

## 2019-06-17 DIAGNOSIS — Z0001 Encounter for general adult medical examination with abnormal findings: Secondary | ICD-10-CM | POA: Diagnosis not present

## 2019-06-17 DIAGNOSIS — R739 Hyperglycemia, unspecified: Secondary | ICD-10-CM | POA: Diagnosis not present

## 2019-06-17 DIAGNOSIS — E559 Vitamin D deficiency, unspecified: Secondary | ICD-10-CM

## 2019-06-17 DIAGNOSIS — E611 Iron deficiency: Secondary | ICD-10-CM | POA: Diagnosis not present

## 2019-06-17 NOTE — Assessment & Plan Note (Signed)
stable overall by history and exam, recent data reviewed with pt, and pt to continue medical treatment as before,  to f/u any worsening symptoms or concerns  

## 2019-06-17 NOTE — Assessment & Plan Note (Signed)

## 2019-06-17 NOTE — Patient Instructions (Signed)
You can also take tylenol as needed for pain.  Please follow up on your COVID testing results from [facility] tomorrow  Please continue all other medications as before, and refills have been done if requested.  Please have the pharmacy call with any other refills you may need.  Please continue your efforts at being more active, low cholesterol diet, and weight control.  You are otherwise up to date with prevention measures today.  Please keep your appointments with your specialists as you may have planned  Please go to the XRAY Department in the Basement (go straight as you get off the elevator) for the x-ray testing tomorrow  Please go to the LAB in the Basement (turn left off the elevator) for the tests to be done today  You will be contacted by phone if any changes need to be made immediately.  Otherwise, you will receive a letter about your results with an explanation, but please check with MyChart first.  Please remember to sign up for MyChart if you have not done so, as this will be important to you in the future with finding out test results, communicating by private email, and scheduling acute appointments online when needed.  Please return in 6 months, or sooner if needed, with Lab testing done 3-5 days before

## 2019-06-17 NOTE — Telephone Encounter (Signed)
Pt's wife called to report pt. C/o fever and headache since Saturday.  Temp. Has fluctuated ; 102 degrees on Saturday.  Was 99-101 on Sunday.  Temp @ 7:30 AM was 98.5.  Reported c/o chills and headache.  Wife reported pt. Has chronic pain, so has a lot of body aches in general.  Stated he has very infreq. Cough.  Denied shortness of breath.  Reported he is feeling worse and more weak.  Reported [facility] and [facility] test on Saturday; awaiting results.  Transferred to office for appt.; wife agreed with plan.   Reason for Disposition . [1] Fever > 101 F (38.3 C) AND [2] age > 62  Answer Assessment - Initial Assessment Questions 1. COVID-19 DIAGNOSIS: "Who made your Coronavirus (COVID-19) diagnosis?" "Was it confirmed by a positive lab test?" If not diagnosed by a HCP, ask "Are there lots of cases (community spread) where you live?" (See public health department website, if unsure)     No travel in 2 weeks. No known exposure to COVID  2. ONSET: "When did the COVID-19 symptoms start?"     Saturday, 6/20 3. WORST SYMPTOM: "What is your worst symptom?" (e.g., cough, fever, shortness of breath, muscle aches)     fever 4. COUGH: "Do you have a cough?" If so, ask: "How bad is the cough?"       Infreq. Cough  5. FEVER: "Do you have a fever?" If so, ask: "What is your temperature, how was it measured, and when did it start?"     Temp. 98.5 about 7:30 AM.  Took Percocet at 5:00 AM.   6. RESPIRATORY STATUS: "Describe your breathing?" (e.g., shortness of breath, wheezing, unable to speak)     No SOB, no wheezing.  7. BETTER-SAME-WORSE: "Are you getting better, staying the same or getting worse compared to yesterday?"  If getting worse, ask, "In what way?"    Feeling worse; weak  8. HIGH RISK DISEASE: "Do you have any chronic medical problems?" (e.g., asthma, heart or lung disease, weak immune system, etc.)     Hx heart problems; Hx of prostate CA; radiation treatments; hx of back problems  9.  PREGNANCY: "Is there any chance you are pregnant?" "When was your last menstrual period?"    N/a  10. OTHER SYMPTOMS: "Do you have any other symptoms?"  (e.g., chills, fatigue, headache, loss of smell or taste, muscle pain, sore throat)      Fever, chills, headache; infreq. Cough; denied shortness of breath; tick bite 2 weeks ago (slightly red) ; no vomiting, no diarrhea  Protocols used: CORONAVIRUS (COVID-19) DIAGNOSED OR SUSPECTED-A-AH

## 2019-06-17 NOTE — Progress Notes (Signed)
Patient ID: Albert Wade, male   DOB: [birth year], 73 y.o.   [name]: [id number]  Virtual Visit via Video Note  I connected with Albert Wade on 06/17/19 at  2:00 PM EDT by a video enabled telemedicine application and verified that I am speaking with the correct person using two identifiers.  Location: Patient: at home Provider: at office   I discussed the limitations of evaluation and management by telemedicine and the availability of in person appointments. The patient expressed understanding and agreed to proceed.  History of Present Illness: Here for wellness and f/u;  Overall doing ok; Marland Kitchen  Pt denies neurological change such as new headache, facial or extremity weakness.  Pt denies polydipsia, polyuria, or low sugar symptoms. Pt states overall good compliance with treatment and medications, good tolerability, and has been trying to follow appropriate diet.  Pt denies worsening depressive symptoms, suicidal ideation or panic. No fever, night sweats, wt loss, loss of appetite, or other constitutional symptoms.  Pt states good ability with ADL's, has low fall risk, home safety reviewed and adequate, no other significant changes in hearing or vision, and only occasionally active with exercise. Also,  Here with 2-3 days acute onset fever, facial pain, pressure, headache, general weakness and malaise, and no nasal d/c, without ST, cough, and pt denies chest pain, wheezing, increased sob or doe, orthopnea, PND, increased LE swelling, palpitations, dizziness or syncope. Past Medical History:  Diagnosis Date  . Acute bronchitis 01/01/2015  . Acute upper respiratory infection 05/19/2016  . Anxiety   . Arthritis    back,wrists,hx. previous fractures  . BPH (benign prostatic hypertrophy)   . BRONCHITIS, CHRONIC 04/29/2008   Qualifier: Diagnosis of  By: Halford Chessman MD, [place]    . CAD 10/16/2009   Qualifier: Diagnosis of  By: Lynden Ang    . CAD (coronary artery disease)    angioplasty   . Chronic  back pain   . Chronic bronchitis   . 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 Confusion 04/27/2017  . Depression   . Encounter for preventative adult health care exam with abnormal findings 12/20/2013  . Fever 04/27/2017  . GERD (gastroesophageal reflux disease)   . Gynecomastia 05/12/2015  . H/O hiatal hernia    pts wife states pt does not have   . Heart murmur   . Hypercholesteremia   . Hyperglycemia 05/19/2016  . HYPERLIPIDEMIA 04/29/2008   Qualifier: Diagnosis of  By: Jim Like    . Hypertension   . Impaired glucose tolerance 12/20/2013  . Insomnia 04/27/2017  . Lumbosacral radiculopathy 10/14/2016  . MI (myocardial infarction) ([place])    mid-'90's  . MYOCARDIAL INFARCTION 04/29/2008   Qualifier: History of  By: Jim Like    . OSA (obstructive sleep apnea) 05/13/2011   Split night study 2011:  AHI 21/hr with obstructive and central events.  Placed on cpap and titrated to 13cm with reasonable control, but attempts to increase pressure increased the number of central events.  Medium resmed quattro full face mask used.    . Peripheral neuropathy    peripheral neuropathy  . Phimosis/adherent prepuce 10/21/2013  . Pneumonia   . Prostate cancer ([place])   . Shortness of breath 11/05/2010   Qualifier: Diagnosis of  By: Marilynne Halsted, RN, BSN, Jacquelyn    . Sleep apnea    mild-no cpap, unable to tolerate  . Umbilical hernia   . Umbilical hernia without obstruction and without gangrene 05/19/2016  . URI (upper respiratory infection) 04/27/2017  .  UTI (urinary tract infection) 12/01/2016  . Varicose veins   . Varicose veins of bilateral lower extremities with other complications 94/76/5465  . Wheezing 01/01/2015   Past Surgical History:  Procedure Laterality Date  . ANGIOPLASTY    . APPENDECTOMY    . BACK SURGERY     x3  . CARDIAC CATHETERIZATION     11'11  . CIRCUMCISION N/A 10/21/2013   Procedure: CIRCUMCISION ADULT;  Surgeon: Bernestine Amass, MD;  Location: [facility];   Service: Urology;  Laterality: N/A;  . CYSTOSCOPY N/A 10/21/2013   Procedure: CYSTOSCOPY FLEXIBLE;  Surgeon: Bernestine Amass, MD;  Location: [facility];  Service: Urology;  Laterality: N/A;  . INSERTION OF MESH N/A 06/17/2016   Procedure: INSERTION OF MESH;  Surgeon: Excell Seltzer, MD;  Location: [facility];  Service: General;  Laterality: N/A;  . UMBILICAL HERNIA REPAIR N/A 06/17/2016   Procedure: REPAIR UMBILICAL HERNIA WITH MESH;  Surgeon: Excell Seltzer, MD;  Location: [facility];  Service: General;  Laterality: N/A;  . WRIST SURGERY Right     reports that he quit smoking about 23 years ago. His smoking use included cigarettes. He has a 20.00 pack-year smoking history. He has never used smokeless tobacco. He reports that he does not drink alcohol or use drugs. family history includes Emphysema in his father; Hyperlipidemia in his father and mother; Prostate cancer in his father. Allergies  Allergen Reactions  . Seroquel [Quetiapine] Hives   Current Outpatient Medications on File Prior to Visit  Medication Sig Dispense Refill  . ALPRAZolam (XANAX) 0.5 MG tablet     . ALPRAZolam (XANAX) 1 MG tablet Take 1 tablet by mouth 2 (two) times daily.    Marland Kitchen aspirin 81 MG tablet Take 81 mg by mouth daily.    . DULoxetine (CYMBALTA) 30 MG capsule Take 30 mg by mouth daily.    . DULoxetine (CYMBALTA) 60 MG capsule     . furosemide (LASIX) 40 MG tablet TAKE 1 TABLET (40 MG) ON DAY 1, THEN TAKE 1.5 TABLETS (60 MG) ON DAY 2, THEN TAKE 2 TABLETS (80 MG) ON DAY 3, THEN GOBACK TO DAY 1 165 tablet 5  . methadone (DOLOPHINE) 10 MG tablet Take 5 mg by mouth 4 (four) times daily.     . mirabegron ER (MYRBETRIQ) 50 MG TB24 tablet Take 1 tablet (50 mg total) by mouth daily. 30 tablet 11  . oxyCODONE-acetaminophen (PERCOCET) 10-325 MG tablet Take 5 tablets by mouth daily.    . potassium chloride (K-DUR) 10 MEQ tablet TAKE 2 TABLETS BY MOUTH PER DAY ON DAYS TAKING FUROSEMIDE 180 tablet 3  . PROAIR HFA 108 (90 Base) MCG/ACT  inhaler INHALE 1 TO 2 PUFFS BY MOUTH INTO THE LUNGS EVERY 6 HOURS AS NEEDED FOR WHEEZING OR SHORTNESS OF BREATH 25.5 g 2  . simvastatin (ZOCOR) 80 MG tablet TAKE 1 TABLET BY MOUTH EVERY EVENING 90 tablet 1  . solifenacin (VESICARE) 10 MG tablet Take 1 tablet (10 mg total) by mouth daily. 90 tablet 0  . traZODone (DESYREL) 50 MG tablet TAKE ONE-HALF TO ONE TABLET BY MOUTH AT BEDTIME AS NEEDED FOR SLEEP 90 tablet 0   No current facility-administered medications on file prior to visit.     Observations/Objective: Alert, NAD, appropriate mood and affect, resps normal, cn 2-12 intact, moves all 4s, no visible rash or swelling Lab Results  Component Value Date   WBC 4.8 02/07/2018   HGB 11.5 (L) 02/07/2018   HCT 33.5 (L) 02/07/2018  PLT 180.0 02/07/2018   GLUCOSE 114 (H) 02/07/2018   CHOL 98 02/07/2018   TRIG 165.0 (H) 02/07/2018   HDL 30.50 (L) 02/07/2018   LDLDIRECT 78.0 05/19/2016   LDLCALC 35 02/07/2018   ALT 14 02/07/2018   AST 14 02/07/2018   NA 143 02/07/2018   K 3.7 02/07/2018   CL 101 02/07/2018   CREATININE 0.92 02/07/2018   BUN 11 02/07/2018   CO2 36 (H) 02/07/2018   TSH 3.74 02/07/2018   PSA 10.73 (H) 05/19/2016   HGBA1C 6.1 02/07/2018   Assessment and Plan: See notes  Follow Up Instructions: See notes   I discussed the assessment and treatment plan with the patient. The patient was provided an opportunity to ask questions and all were answered. The patient agreed with the plan and demonstrated an understanding of the instructions.   The patient was advised to call back or seek an in-person evaluation if the symptoms worsen or if the condition fails to improve as anticipated.   Cathlean Cower, MD

## 2019-06-17 NOTE — Assessment & Plan Note (Signed)
C/w likely URI likely viral illness vs other, for cxr and labs as ordered, and f/u [facility] and should result by tomorrow or next day

## 2019-06-18 ENCOUNTER — Other Ambulatory Visit: Payer: Self-pay

## 2019-06-18 ENCOUNTER — Ambulatory Visit (INDEPENDENT_AMBULATORY_CARE_PROVIDER_SITE_OTHER)
Admission: RE | Admit: 2019-06-18 | Discharge: 2019-06-18 | Disposition: A | Discharge status: Home / Self Care | Payer: Medicare Other | Source: Ambulatory Visit | Attending: Internal Medicine | Admitting: Internal Medicine

## 2019-06-18 ENCOUNTER — Other Ambulatory Visit (INDEPENDENT_AMBULATORY_CARE_PROVIDER_SITE_OTHER): Payer: Medicare Other

## 2019-06-18 DIAGNOSIS — E559 Vitamin D deficiency, unspecified: Secondary | ICD-10-CM

## 2019-06-18 DIAGNOSIS — E611 Iron deficiency: Secondary | ICD-10-CM | POA: Diagnosis not present

## 2019-06-18 DIAGNOSIS — R739 Hyperglycemia, unspecified: Secondary | ICD-10-CM | POA: Diagnosis not present

## 2019-06-18 DIAGNOSIS — E538 Deficiency of other specified B group vitamins: Secondary | ICD-10-CM

## 2019-06-18 DIAGNOSIS — R509 Fever, unspecified: Secondary | ICD-10-CM

## 2019-06-18 DIAGNOSIS — Z0001 Encounter for general adult medical examination with abnormal findings: Secondary | ICD-10-CM

## 2019-06-18 DIAGNOSIS — R918 Other nonspecific abnormal finding of lung field: Secondary | ICD-10-CM | POA: Diagnosis not present

## 2019-06-18 LAB — CBC WITH DIFFERENTIAL/PLATELET
Basophils Absolute: 0 10*3/uL (ref 0.0–0.1)
Basophils Relative: 0.8 % (ref 0.0–3.0)
Eosinophils Absolute: 0.2 10*3/uL (ref 0.0–0.7)
Eosinophils Relative: 4.7 % (ref 0.0–5.0)
HCT: 36.1 % — ABNORMAL LOW (ref 39.0–52.0)
Hemoglobin: 12.2 g/dL — ABNORMAL LOW (ref 13.0–17.0)
Lymphocytes Relative: 18.7 % (ref 12.0–46.0)
Lymphs Abs: 0.8 10*3/uL (ref 0.7–4.0)
MCHC: 33.9 g/dL (ref 30.0–36.0)
MCV: 90.5 fl (ref 78.0–100.0)
Monocytes Absolute: 0.5 10*3/uL (ref 0.1–1.0)
Monocytes Relative: 12.2 % — ABNORMAL HIGH (ref 3.0–12.0)
Neutro Abs: 2.7 10*3/uL (ref 1.4–7.7)
Neutrophils Relative %: 63.6 % (ref 43.0–77.0)
Platelets: 160 10*3/uL (ref 150.0–400.0)
RBC: 3.99 Mil/uL — ABNORMAL LOW (ref 4.22–5.81)
RDW: 13.1 % (ref 11.5–15.5)
WBC: 4.2 10*3/uL (ref 4.0–10.5)

## 2019-06-18 LAB — BASIC METABOLIC PANEL WITH GFR
BUN: 10 mg/dL (ref 6–23)
CO2: 32 meq/L (ref 19–32)
Calcium: 8.3 mg/dL — ABNORMAL LOW (ref 8.4–10.5)
Chloride: 101 meq/L (ref 96–112)
Creatinine, Ser: 0.95 mg/dL (ref 0.40–1.50)
GFR: 77.65 mL/min
Glucose, Bld: 118 mg/dL — ABNORMAL HIGH (ref 70–99)
Potassium: 3.4 meq/L — ABNORMAL LOW (ref 3.5–5.1)
Sodium: 140 meq/L (ref 135–145)

## 2019-06-18 LAB — URINALYSIS, ROUTINE W REFLEX MICROSCOPIC
Bilirubin Urine: NEGATIVE
Hgb urine dipstick: NEGATIVE
Leukocytes,Ua: NEGATIVE
Nitrite: NEGATIVE
RBC / HPF: NONE SEEN
Specific Gravity, Urine: 1.015 (ref 1.000–1.030)
Total Protein, Urine: NEGATIVE
Urine Glucose: NEGATIVE
Urobilinogen, UA: 0.2 (ref 0.0–1.0)
pH: 6 (ref 5.0–8.0)

## 2019-06-18 LAB — HEMOGLOBIN A1C: Hgb A1c MFr Bld: 6.2 % (ref 4.6–6.5)

## 2019-06-18 LAB — LIPID PANEL
Cholesterol: 114 mg/dL (ref 0–200)
HDL: 41.7 mg/dL
LDL Cholesterol: 51 mg/dL (ref 0–99)
NonHDL: 71.95
Total CHOL/HDL Ratio: 3
Triglycerides: 104 mg/dL (ref 0.0–149.0)
VLDL: 20.8 mg/dL (ref 0.0–40.0)

## 2019-06-18 LAB — HEPATIC FUNCTION PANEL
ALT: 46 U/L (ref 0–53)
AST: 31 U/L (ref 0–37)
Albumin: 3.7 g/dL (ref 3.5–5.2)
Alkaline Phosphatase: 147 U/L — ABNORMAL HIGH (ref 39–117)
Bilirubin, Direct: 0.1 mg/dL (ref 0.0–0.3)
Total Bilirubin: 0.4 mg/dL (ref 0.2–1.2)
Total Protein: 6.2 g/dL (ref 6.0–8.3)

## 2019-06-18 LAB — IBC PANEL
Iron: 40 ug/dL — ABNORMAL LOW (ref 42–165)
Saturation Ratios: 13.8 % — ABNORMAL LOW (ref 20.0–50.0)
Transferrin: 207 mg/dL — ABNORMAL LOW (ref 212.0–360.0)

## 2019-06-18 LAB — TSH: TSH: 1.9 u[IU]/mL (ref 0.35–4.50)

## 2019-06-18 LAB — PSA: PSA: 0.27 ng/mL (ref 0.10–4.00)

## 2019-06-18 LAB — VITAMIN B12: Vitamin B-12: 295 pg/mL (ref 211–911)

## 2019-06-18 LAB — VITAMIN D 25 HYDROXY (VIT D DEFICIENCY, FRACTURES): VITD: 53.11 ng/mL (ref 30.00–100.00)

## 2019-06-21 ENCOUNTER — Telehealth: Payer: Self-pay | Admitting: *Deleted

## 2019-06-21 NOTE — Telephone Encounter (Signed)
"  This is Albert Wade in [place].  My husband, [name], is a patient of Dr. Milinda Pointer.  We need to find out from you when Dr. Milinda Pointer wants to get this surgery scheduled because he has to stop taking his aspirin."

## 2019-06-22 ENCOUNTER — Encounter: Payer: Self-pay | Admitting: Internal Medicine

## 2019-06-24 ENCOUNTER — Other Ambulatory Visit: Payer: Medicare Other

## 2019-06-24 ENCOUNTER — Telehealth: Payer: Self-pay | Admitting: Internal Medicine

## 2019-06-24 DIAGNOSIS — Z20822 Contact with and (suspected) exposure to covid-19: Secondary | ICD-10-CM

## 2019-06-24 DIAGNOSIS — R6889 Other general symptoms and signs: Secondary | ICD-10-CM | POA: Diagnosis not present

## 2019-06-24 NOTE — Telephone Encounter (Signed)
Please schedule for a doxy/phone visit.  Thank you!

## 2019-06-24 NOTE — Addendum Note (Signed)
Addended by: Denyce Robert on: 06/24/2019 10:11 AM   Modules accepted: Orders

## 2019-06-24 NOTE — Telephone Encounter (Signed)
Pt wife calling stating the patient is not doing any better and would like an in office appt. Has cough, weakness and fever. Please advise on in office visit

## 2019-06-24 NOTE — Telephone Encounter (Signed)
I agree; we are not able to currently accomodate in office visits for acute infectious resp illness like visits due to COVID risk; pt should be referred for COVID testing and/or instructed to go to [facility] or ED for evaluation

## 2019-06-24 NOTE — Telephone Encounter (Signed)
Wife told about test

## 2019-06-24 NOTE — Telephone Encounter (Signed)
Per DPR spoke with patient's wife Albert Wade, scheduled patient for COVID 19 test today at 11 am at [facility].  Testing protocol reviewed.

## 2019-06-24 NOTE — Telephone Encounter (Signed)
I have routed to [facility] testing pool for pt to be contacted for covid19 testing.

## 2019-06-26 ENCOUNTER — Telehealth: Payer: Self-pay | Admitting: Internal Medicine

## 2019-06-26 NOTE — Telephone Encounter (Signed)
FYI

## 2019-06-26 NOTE — Telephone Encounter (Signed)
Pt's wife called stating that pt has been having severe abdominal pain with high fluctuating fevers (99-103), pt is also broken out in hives.  Tried to get intouch with NT, hold times are high.  Advised per nurse, Michaele Offer, to tell pt to go to ED. Wife states she understands but doesn't think that pt will go.

## 2019-06-27 ENCOUNTER — Telehealth: Payer: Self-pay

## 2019-06-27 LAB — NOVEL CORONAVIRUS, NAA: SARS-CoV-2, NAA: NOT DETECTED

## 2019-06-27 NOTE — Telephone Encounter (Signed)
-----   Message from Biagio Borg, MD sent at 06/27/2019 12:23 PM EDT ----- Kenji Mapel to let pt know - COVID test is negative

## 2019-06-27 NOTE — Telephone Encounter (Signed)
Called pt, LVM.   CRM created.  

## 2019-07-01 ENCOUNTER — Other Ambulatory Visit: Payer: Self-pay | Admitting: Urology

## 2019-07-02 DIAGNOSIS — M48061 Spinal stenosis, lumbar region without neurogenic claudication: Secondary | ICD-10-CM | POA: Diagnosis not present

## 2019-07-02 DIAGNOSIS — G894 Chronic pain syndrome: Secondary | ICD-10-CM | POA: Diagnosis not present

## 2019-07-04 ENCOUNTER — Encounter: Payer: Self-pay | Admitting: *Deleted

## 2019-07-04 NOTE — Telephone Encounter (Signed)
I am returning your call.  How can I help you?  You had called inquiring about him taking his Aspirin.  "Well I know my husband is supposed to stop taking his aspirin a week before his surgery date.  We just don't know when it's scheduled." I was waiting on a call from you to schedule it.  "He was told that someone would call him."  I apologize for the missed communication.  I can go ahead and schedule a date if you would like.  Dr. Milinda Pointer wants medical clearance from Dr. Harrington Challenger and Dr. Jenny Reichmann as well.  "Do we need to get that done?"  I'll send them a letter.  "Okay, that sounds good."  Does he have a date that he would like?  "Can we schedule it for his next available date?"  His next available date is August 09, 2019.  "What day is that on and what time?"  That is a Friday.  Someone from the surgical center will give him a call a day or two prior to your surgery date and will give him his arrival time.  He needs to register with the surgical center online if you all have access to a computer, the instructions are in the brochure that we gave him.  "I'll take care of that in the morning."  I sent a medical clearance request letter to Dr. Jenny Reichmann and Dr. Harrington Challenger per Dr. Milinda Pointer.

## 2019-07-04 NOTE — Progress Notes (Signed)
Per Dr. Milinda Pointer, I sent a medical clearance request letter to Dr. Cathlean Cower and to Dr. Dorris Carnes.

## 2019-07-05 ENCOUNTER — Encounter: Payer: Self-pay | Admitting: Internal Medicine

## 2019-07-08 ENCOUNTER — Telehealth: Payer: Self-pay | Admitting: *Deleted

## 2019-07-08 NOTE — Telephone Encounter (Signed)
   [facility] Pre-operative Risk Assessment    Request for surgical clearance:  1. What type of surgery is being performed? AUSTIN BUNIONECTOMY, METATARSAL OSTEOTOMY 2ND, 3RD DIGITS and HAMMER TOE REPAIR 2ND, 3RD DIGITS THE RIGHT FOOT  2. When is this surgery scheduled? 08/09/19  3. What type of clearance is required (medical clearance vs. Pharmacy clearance to hold med vs. Both)? MEDICAL  4. Are there any medications that need to be held prior to surgery and how long? ASA   5. Practice name and name of physician performing surgery? [facility]; DR. Jerilynn Mages. TODD HYATT  6. What is your office phone number [phone]   7.   What is your office fax number [phone]  8.   Anesthesia type (None, local, MAC, general) ? GENERAL   Albert Wade 07/08/2019, 5:23 PM  _________________________________________________________________   (provider comments below)

## 2019-07-09 NOTE — Telephone Encounter (Signed)
   Primary Cardiologist:Paula Harrington Challenger, MD  Chart reviewed as part of pre-operative protocol coverage. Because of Albert Wade's past medical history and time since last visit, he/she will require a follow-up visit in order to better assess preoperative cardiovascular risk.  Pt needs appt in next 2 weeks with APP  Pre-op covering staff: - Please schedule appointment and call patient to inform them. - Please contact requesting surgeon's office via preferred method (i.e, phone, fax) to inform them of need for appointment prior to surgery.  If applicable, this message will also be routed to pharmacy pool and/or primary cardiologist for input on holding anticoagulant/antiplatelet agent as requested below so that this information is available at time of patient's appointment.   Cecilie Kicks, NP  07/09/2019, 9:02 AM

## 2019-07-09 NOTE — Telephone Encounter (Signed)
I left a message for the patient to return my call regarding surgical clearance appointment.

## 2019-07-10 NOTE — Telephone Encounter (Signed)
Pt is scheduled to see Pecolia Ades, NP on 07/18/2019

## 2019-07-17 NOTE — Patient Instructions (Addendum)
Medication Instructions:  Your physician recommends that you continue on your current medications as directed. Please refer to the Current Medication list given to you today.  If you need a refill on your cardiac medications before your next appointment, please call your pharmacy.   Lab work: None   If you have labs (blood work) drawn today and your tests are completely normal, you will receive your results only by: Marland Kitchen MyChart Message (if you have MyChart) OR . A paper copy in the mail If you have any lab test that is abnormal or we need to change your treatment, we will call you to review the results.  Testing/Procedures: Your physician has requested that you have a lower arterial duplex with ABI's in 3 months. This test is an ultrasound of the arteries in the legs or arms. It looks at arterial blood flow in the legs and arms. Allow one hour for Lower and Upper Arterial scans. There are no restrictions or special instructions   Follow-Up: At [facility], you and your health needs are our priority.  As part of our continuing mission to provide you with exceptional heart care, we have created designated Provider Care Teams.  These Care Teams include your primary Cardiologist (physician) and Advanced Practice Providers (APPs -  Physician Assistants and Nurse Practitioners) who all work together to provide you with the care you need, when you need it. You will need a follow up appointment in:  12 months.  Please call our office 2 months in advance to schedule this appointment.  You may see Dorris Carnes, MD or one of the following Advanced Practice Providers on your designated Care Team: Richardson Dopp, PA-C [place], [place] . Daune Perch, NP  Any Other Special Instructions Will Be Listed Below (If Applicable).  Lifestyle Modifications to Prevent and Treat Heart Disease -Recommend heart healthy/Mediterranean diet, with whole grains, fruits, vegetable, fish, lean meats, nuts, and olive oil.   -Limit salt. -Recommend moderate walking, 3-5 times/week for 30-50 minutes each session. Aim for at least 150 minutes.week. Goal should be pace of 3 miles/hours, or walking 1.5 miles in 30 minutes -Recommend avoidance of tobacco products. Avoid excess alcohol. -Keep blood pressure well controlled, ideally less than 130/80.

## 2019-07-17 NOTE — Progress Notes (Addendum)
Cardiology Office Note:    Date:  07/18/2019   ID:  Albert, Wade 11-28-1946, [name] [id number]  PCP:  Biagio Borg, MD  Cardiologist:  Dorris Carnes, MD  Referring MD: Biagio Borg, MD   Chief Complaint  Patient presents with  . Pre-op Exam    History of Present Illness:    Albert Wade is a 73 y.o. male with a past medical history significant for mild CAD by cath in 2011, COPD, hyperlipidemia and hypertension. He had prostate cancer ~3 yrs ago treated with radiation.  Albert Wade was last seen in the office on 09/07/2018 by Dr. Harrington Challenger.  Myoview in 02/2016 showed no significant ischemia and normal LVEF. OSA- didn't tolerate CPAP. He feels like he is better is wife says he only snores a little and no longer stops breathing like he used to.  Used to Smoke but has been quit for about 30 years. Wife reports that diet is not very good. Lots of candy and ice cream. MI/angioplasty 1993 on the day his dad died in [place].   Albert Wade is being seen today for surgical clearance prior to foot surgery. He walks his dogs and mows with riding mower, helps his neighbors. He does not like to sit still for long.  He denies any chest pain/pressure, shortness of breath, orthopnea, PND, swelling, lightheadedness or syncope.  He complains of bilateral leg pain down to feet.  His pain is constant, worse with walking.  His feet feel numb with stinging and burning. He has been told he has neuropathy. His back doctor thinks coming from his back. He has significant varicose veins. He and his wife would like his legs tested as she is concerned about claudication.  He had his leg circulation tested many years ago, not recently. His wife goes to Dr Fletcher Anon in [place] and has a stent in her leg.   Past Medical History:  Diagnosis Date  . Acute bronchitis 01/01/2015  . Acute upper respiratory infection 05/19/2016  . Anxiety   . Arthritis    back,wrists,hx. previous fractures  . BPH (benign prostatic  hypertrophy)   . BRONCHITIS, CHRONIC 04/29/2008   Qualifier: Diagnosis of  By: Halford Chessman MD, [place]    . CAD 10/16/2009   Qualifier: Diagnosis of  By: Lynden Ang    . CAD (coronary artery disease)    angioplasty   . Chronic back pain   . Chronic bronchitis   . 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 Confusion 04/27/2017  . Depression   . Encounter for preventative adult health care exam with abnormal findings 12/20/2013  . Fever 04/27/2017  . GERD (gastroesophageal reflux disease)   . Gynecomastia 05/12/2015  . H/O hiatal hernia    pts wife states pt does not have   . Heart murmur   . Hypercholesteremia   . Hyperglycemia 05/19/2016  . HYPERLIPIDEMIA 04/29/2008   Qualifier: Diagnosis of  By: Jim Like    . Hypertension   . Impaired glucose tolerance 12/20/2013  . Insomnia 04/27/2017  . Lumbosacral radiculopathy 10/14/2016  . MI (myocardial infarction) ([place])    mid-'90's  . MYOCARDIAL INFARCTION 04/29/2008   Qualifier: History of  By: Jim Like    . OSA (obstructive sleep apnea) 05/13/2011   Split night study 2011:  AHI 21/hr with obstructive and central events.  Placed on cpap and titrated to 13cm with reasonable control, but attempts to increase pressure increased the number of central events.  Medium  resmed quattro full face mask used.    . Peripheral neuropathy    peripheral neuropathy  . Phimosis/adherent prepuce 10/21/2013  . Pneumonia   . Prostate cancer ([place])   . Shortness of breath 11/05/2010   Qualifier: Diagnosis of  By: Marilynne Halsted, RN, BSN, Jacquelyn    . Sleep apnea    mild-no cpap, unable to tolerate  . Umbilical hernia   . Umbilical hernia without obstruction and without gangrene 05/19/2016  . URI (upper respiratory infection) 04/27/2017  . UTI (urinary tract infection) 12/01/2016  . Varicose veins   . Varicose veins of bilateral lower extremities with other complications 41/93/7902  . Wheezing 01/01/2015    Past Surgical History:  Procedure  Laterality Date  . ANGIOPLASTY    . APPENDECTOMY    . BACK SURGERY     x3  . CARDIAC CATHETERIZATION     11'11  . CIRCUMCISION N/A 10/21/2013   Procedure: CIRCUMCISION ADULT;  Surgeon: Bernestine Amass, MD;  Location: [facility];  Service: Urology;  Laterality: N/A;  . CYSTOSCOPY N/A 10/21/2013   Procedure: CYSTOSCOPY FLEXIBLE;  Surgeon: Bernestine Amass, MD;  Location: [facility];  Service: Urology;  Laterality: N/A;  . INSERTION OF MESH N/A 06/17/2016   Procedure: INSERTION OF MESH;  Surgeon: Excell Seltzer, MD;  Location: [facility];  Service: General;  Laterality: N/A;  . UMBILICAL HERNIA REPAIR N/A 06/17/2016   Procedure: REPAIR UMBILICAL HERNIA WITH MESH;  Surgeon: Excell Seltzer, MD;  Location: [facility];  Service: General;  Laterality: N/A;  . WRIST SURGERY Right     Current Medications: Current Meds  Medication Sig  . ALPRAZolam (XANAX) 0.5 MG tablet Take 0.5 mg by mouth daily.   Marland Kitchen aspirin 81 MG tablet Take 81 mg by mouth daily.  . DULoxetine (CYMBALTA) 60 MG capsule Take 60 mg by mouth 2 (two) times daily.   . furosemide (LASIX) 40 MG tablet Take 40 mg by mouth daily as needed.  . methadone (DOLOPHINE) 10 MG tablet Take 5 mg by mouth 3 (three) times daily.   . mirabegron ER (MYRBETRIQ) 50 MG TB24 tablet Take 1 tablet (50 mg total) by mouth daily.  Marland Kitchen oxyCODONE-acetaminophen (PERCOCET) 10-325 MG tablet Take 5 tablets by mouth daily.  . potassium chloride (K-DUR) 10 MEQ tablet TAKE 2 TABLETS BY MOUTH PER DAY ON DAYS TAKING FUROSEMIDE  . PROAIR HFA 108 (90 Base) MCG/ACT inhaler INHALE 1 TO 2 PUFFS BY MOUTH INTO THE LUNGS EVERY 6 HOURS AS NEEDED FOR WHEEZING OR SHORTNESS OF BREATH  . simvastatin (ZOCOR) 80 MG tablet TAKE 1 TABLET BY MOUTH EVERY EVENING  . solifenacin (VESICARE) 10 MG tablet Take 10 mg by mouth daily as needed.  Marland Kitchen SYMPROIC 0.2 MG TABS Take 0.2 mg by mouth daily.  . traZODone (DESYREL) 50 MG tablet TAKE ONE-HALF TO ONE TABLET BY MOUTH AT BEDTIME AS NEEDED FOR SLEEP      Allergies:   Seroquel [quetiapine]   Social History   Socioeconomic History  . Marital status: Married    Spouse name: Not on file  . Number of children: Not on file  . Years of education: Not on file  . Highest education level: Not on file  Occupational History  . Occupation: retired Investment banker, operational  . Financial resource strain: Not on file  . Food insecurity    Worry: Not on file    Inability: Not on file  . Transportation needs    Medical: Not on file  Non-medical: Not on file  Tobacco Use  . Smoking status: Former Smoker    Packs/day: 0.50    Years: 40.00    Pack years: 20.00    Types: Cigarettes    Quit date: 12/27/1995    Years since quitting: 23.5  . Smokeless tobacco: Never Used  Substance and Sexual Activity  . Alcohol use: No  . Drug use: No  . Sexual activity: Yes  Lifestyle  . Physical activity    Days per week: Not on file    Minutes per session: Not on file  . Stress: Not on file  Relationships  . Social Herbalist on phone: Not on file    Gets together: Not on file    Attends religious service: Not on file    Active member of club or organization: Not on file    Attends meetings of clubs or organizations: Not on file    Relationship status: Not on file  Other Topics Concern  . Not on file  Social History Narrative  . Not on file     Family History: The patient's family history includes Emphysema in his father; Hyperlipidemia in his father and mother; Prostate cancer in his father. ROS:   Please see the history of present illness.     All other systems reviewed and are negative.  EKGs/Labs/Other Studies Reviewed:    The following studies were reviewed today:  Myoview 03/14/2016 Study Highlights   Nuclear stress EF: 59%.  No T wave inversion was noted during stress.  There was no ST segment deviation noted during stress.  Defect 1: There is a small defect of mild severity.  This is a low risk study.   Small,  mild fixed apical artifact - possibly thinning. No reversible ischemia. LVEF 59% with normal wall motion. This is a low risk study.      EKG:  EKG is ordered today.  The ekg ordered today demonstrates normal sinus rhythm, 72 bpm, QTC 448.  No new changes.  Recent Labs: 06/18/2019: ALT 46; BUN 10; Creatinine, Ser 0.95; Hemoglobin 12.2; Platelets 160.0; Potassium 3.4; Sodium 140; TSH 1.90   Recent Lipid Panel    Component Value Date/Time   CHOL 114 06/18/2019 1020   TRIG 104.0 06/18/2019 1020   HDL 41.70 06/18/2019 1020   CHOLHDL 3 06/18/2019 1020   VLDL 20.8 06/18/2019 1020   LDLCALC 51 06/18/2019 1020   LDLDIRECT 78.0 05/19/2016 1610    Physical Exam:    VS:  BP 128/72   Pulse 71   Ht 5' 10.5" (1.791 m)   Wt 199 lb 12.8 oz (90.6 kg)   SpO2 96%   BMI 28.26 kg/m     Wt Readings from Last 3 Encounters:  07/18/19 199 lb 12.8 oz (90.6 kg)  05/09/19 213 lb 10 oz (96.9 kg)  05/08/19 216 lb (98 kg)     Physical Exam  Constitutional: He is oriented to person, place, and time. He appears well-developed and well-nourished. No distress.  HENT:  Head: Normocephalic and atraumatic.  Neck: Normal range of motion. Neck supple. No JVD present.  Cardiovascular: Normal rate, regular rhythm, normal heart sounds and intact distal pulses. Exam reveals no gallop and no friction rub.  No murmur heard. Pulmonary/Chest: Effort normal and breath sounds normal. No respiratory distress. He has no wheezes. He has no rales.  Abdominal: Soft. Bowel sounds are normal.  Musculoskeletal: Normal range of motion.        General: No  edema.     Comments: Large varicose veins on legs  Neurological: He is alert and oriented to person, place, and time.  Skin: Skin is warm and dry.  Psychiatric: He has a normal mood and affect. His behavior is normal. Judgment and thought content normal.  Vitals reviewed.    ASSESSMENT:    1. Essential (primary) hypertension   2. Hyperlipidemia, unspecified  hyperlipidemia type   3. Coronary artery disease involving native coronary artery of native heart without angina pectoris   4. Pain in both lower extremities   5. Preop cardiovascular exam    PLAN:    In order of problems listed above:  CAD:  (MI w/ angioplasty 1993)  Last cath in 2011 showed only mild disease (per notes).  Low risk myoview in 02/2016. Medical therapy includes aspirin and statin.  Patient is quite active with no anginal symptoms. Patient advised on improving his diet.  Hypertension: Blood pressure is well controlled on current therapy.  Hyperlipidemia: On simvastatin 80 mg daily.  Recent lipid panel on 06/18/2019 showed LDL of 51 which is at goal of less than 70.  Continue current therapy.  OSA: Past history.  Patient was unable to tolerate CPAP.  His wife notes that he only snores rarely now and no longer stops breathing like he used to.  Bilateral leg pain: Patient with suspected neuropathy related to back issues.  His pain is constant but worse with walking.  Patient is a prior smoker.  Patient and his wife are concerned about circulation issues.  Patient has numbness and stinging of his feet.  Will check ABIs and lower extremity arterial Dopplers.(plan for 3 months once recovered from foot surgery-does not need to be done prior)  Patient wishes to be referred to Dr. Fletcher Anon in [place] if testing is abnormal.   Surgical clearance for bunionectomy, metatarsal osteotomy, hammertoe repair. -According to the revised cardiac risk index, Albert Wade is a class II risk with 0.9% risk of major cardiac event perioperatively. Low risk.  -He is currently without any anginal or heart failure symptoms.  He is able to easily complete more than 4 METS of activity. -He can proceed with the surgery without any further cardiac testing. -OK to hold aspirin as needed for surgery. Resume after when OK with surgeon.   I will efax this clearance note to [facility]   Medication  Adjustments/Labs and Tests Ordered: Current medicines are reviewed at length with the patient today.  Concerns regarding medicines are outlined above. Labs and tests ordered and medication changes are outlined in the patient instructions below:  Patient Instructions  Medication Instructions:  Your physician recommends that you continue on your current medications as directed. Please refer to the Current Medication list given to you today.  If you need a refill on your cardiac medications before your next appointment, please call your pharmacy.   Lab work: None   If you have labs (blood work) drawn today and your tests are completely normal, you will receive your results only by: Marland Kitchen MyChart Message (if you have MyChart) OR . A paper copy in the mail If you have any lab test that is abnormal or we need to change your treatment, we will call you to review the results.  Testing/Procedures: Your physician has requested that you have a lower arterial duplex with ABI's in 3 months. This test is an ultrasound of the arteries in the legs or arms. It looks at arterial blood flow in the  legs and arms. Allow one hour for Lower and Upper Arterial scans. There are no restrictions or special instructions   Follow-Up: At [facility], you and your health needs are our priority.  As part of our continuing mission to provide you with exceptional heart care, we have created designated Provider Care Teams.  These Care Teams include your primary Cardiologist (physician) and Advanced Practice Providers (APPs -  Physician Assistants and Nurse Practitioners) who all work together to provide you with the care you need, when you need it. You will need a follow up appointment in:  12 months.  Please call our office 2 months in advance to schedule this appointment.  You may see Dorris Carnes, MD or one of the following Advanced Practice Providers on your designated Care Team: Richardson Dopp, PA-C [place], [place] . Daune Perch, NP  Any Other Special Instructions Will Be Listed Below (If Applicable).  Lifestyle Modifications to Prevent and Treat Heart Disease -Recommend heart healthy/Mediterranean diet, with whole grains, fruits, vegetable, fish, lean meats, nuts, and olive oil.  -Limit salt. -Recommend moderate walking, 3-5 times/week for 30-50 minutes each session. Aim for at least 150 minutes.week. Goal should be pace of 3 miles/hours, or walking 1.5 miles in 30 minutes -Recommend avoidance of tobacco products. Avoid excess alcohol. -Keep blood pressure well controlled, ideally less than 130/80.      Signed, Daune Perch, NP  07/18/2019 9:39 AM    [facility]

## 2019-07-18 ENCOUNTER — Ambulatory Visit (INDEPENDENT_AMBULATORY_CARE_PROVIDER_SITE_OTHER): Payer: Medicare Other | Admitting: Cardiology

## 2019-07-18 ENCOUNTER — Other Ambulatory Visit: Payer: Self-pay

## 2019-07-18 ENCOUNTER — Encounter: Payer: Self-pay | Admitting: Cardiology

## 2019-07-18 VITALS — BP 128/72 | HR 71 | Ht 70.5 in | Wt 199.8 lb

## 2019-07-18 DIAGNOSIS — M79604 Pain in right leg: Secondary | ICD-10-CM | POA: Diagnosis not present

## 2019-07-18 DIAGNOSIS — I2510 Atherosclerotic heart disease of native coronary artery without angina pectoris: Secondary | ICD-10-CM

## 2019-07-18 DIAGNOSIS — E785 Hyperlipidemia, unspecified: Secondary | ICD-10-CM | POA: Diagnosis not present

## 2019-07-18 DIAGNOSIS — M79605 Pain in left leg: Secondary | ICD-10-CM | POA: Diagnosis not present

## 2019-07-18 DIAGNOSIS — Z01810 Encounter for preprocedural cardiovascular examination: Secondary | ICD-10-CM | POA: Diagnosis not present

## 2019-07-18 DIAGNOSIS — I10 Essential (primary) hypertension: Secondary | ICD-10-CM

## 2019-07-19 ENCOUNTER — Other Ambulatory Visit: Payer: Self-pay | Admitting: Internal Medicine

## 2019-07-29 ENCOUNTER — Telehealth: Payer: Self-pay | Admitting: Podiatry

## 2019-07-29 NOTE — Telephone Encounter (Signed)
Pt's wife called and stated pt requested to reschedule his surgery to Friday, 30 October. I told the pt I would get it changed and let Dr. Milinda Pointer and [facility] know as well as the surgical center know. Told her to call us with any questions/concerns she may have.  Rescheduled surgery to 30 October in [facility].

## 2019-08-12 ENCOUNTER — Other Ambulatory Visit: Payer: Self-pay | Admitting: Internal Medicine

## 2019-08-28 ENCOUNTER — Other Ambulatory Visit: Payer: Self-pay | Admitting: Cardiology

## 2019-08-28 DIAGNOSIS — M79604 Pain in right leg: Secondary | ICD-10-CM

## 2019-08-28 DIAGNOSIS — M79605 Pain in left leg: Secondary | ICD-10-CM

## 2019-09-06 ENCOUNTER — Other Ambulatory Visit: Payer: Self-pay | Admitting: Internal Medicine

## 2019-09-06 ENCOUNTER — Ambulatory Visit (INDEPENDENT_AMBULATORY_CARE_PROVIDER_SITE_OTHER): Payer: Medicare Other

## 2019-09-06 DIAGNOSIS — M79604 Pain in right leg: Secondary | ICD-10-CM

## 2019-09-06 DIAGNOSIS — M79605 Pain in left leg: Secondary | ICD-10-CM | POA: Diagnosis not present

## 2019-09-20 DIAGNOSIS — G894 Chronic pain syndrome: Secondary | ICD-10-CM | POA: Diagnosis not present

## 2019-09-20 DIAGNOSIS — M25579 Pain in unspecified ankle and joints of unspecified foot: Secondary | ICD-10-CM | POA: Diagnosis not present

## 2019-09-20 DIAGNOSIS — K5909 Other constipation: Secondary | ICD-10-CM | POA: Diagnosis not present

## 2019-09-20 DIAGNOSIS — M48061 Spinal stenosis, lumbar region without neurogenic claudication: Secondary | ICD-10-CM | POA: Diagnosis not present

## 2019-09-27 ENCOUNTER — Telehealth: Payer: Self-pay | Admitting: Podiatry

## 2019-09-27 NOTE — Telephone Encounter (Signed)
DOS: 10/25/2019 SURGICAL PROCEDURES: Liane Comber Bunionectomy Rt, Metatarsal Osteotomy 2nd and 3rd Rt, Hammertoe Repair 2nd and 3rd Rt CPT CODES: 29562, 28308 (X2), [phone] (X2) DX CODES: M20.11, M20.41  [employer] Effective Date: 12/26/2018  Notification or Prior Authorization is not required for the requested services  This [employer] Medicare Advantage members plan does not currently require a prior authorization for these services. If you have general questions about the prior authorization requirements, please call us at [phone] or visit [email or web address] > Clinician Resources > Advance and Admission Notification Requirements. The number above acknowledges your notification. Please write this number down for future reference. Notification is not a guarantee of coverage or payment.  Decision ID [id number]  The number above acknowledges your inquiry and our response. Please write this number down and refer to it for future inquiries. Coverage and payment for an item or service is governed by the member's benefit plan document, and, if applicable, the provider's participation agreement with the Health Plan.

## 2019-10-08 ENCOUNTER — Ambulatory Visit (INDEPENDENT_AMBULATORY_CARE_PROVIDER_SITE_OTHER): Payer: Medicare Other | Admitting: Internal Medicine

## 2019-10-08 ENCOUNTER — Encounter: Payer: Self-pay | Admitting: Internal Medicine

## 2019-10-08 ENCOUNTER — Other Ambulatory Visit (INDEPENDENT_AMBULATORY_CARE_PROVIDER_SITE_OTHER): Payer: Medicare Other

## 2019-10-08 ENCOUNTER — Other Ambulatory Visit: Payer: Self-pay

## 2019-10-08 VITALS — BP 126/78 | HR 73 | Temp 97.9°F | Ht 70.5 in | Wt 197.0 lb

## 2019-10-08 DIAGNOSIS — R739 Hyperglycemia, unspecified: Secondary | ICD-10-CM | POA: Diagnosis not present

## 2019-10-08 DIAGNOSIS — R109 Unspecified abdominal pain: Secondary | ICD-10-CM | POA: Insufficient documentation

## 2019-10-08 DIAGNOSIS — K429 Umbilical hernia without obstruction or gangrene: Secondary | ICD-10-CM

## 2019-10-08 DIAGNOSIS — K5900 Constipation, unspecified: Secondary | ICD-10-CM | POA: Diagnosis not present

## 2019-10-08 DIAGNOSIS — R1033 Periumbilical pain: Secondary | ICD-10-CM

## 2019-10-08 DIAGNOSIS — R634 Abnormal weight loss: Secondary | ICD-10-CM

## 2019-10-08 DIAGNOSIS — K5903 Drug induced constipation: Secondary | ICD-10-CM | POA: Insufficient documentation

## 2019-10-08 DIAGNOSIS — T402X5A Adverse effect of other opioids, initial encounter: Secondary | ICD-10-CM | POA: Insufficient documentation

## 2019-10-08 LAB — CBC WITH DIFFERENTIAL/PLATELET
Basophils Absolute: 0 10*3/uL (ref 0.0–0.1)
Basophils Relative: 0.7 % (ref 0.0–3.0)
Eosinophils Absolute: 0.2 10*3/uL (ref 0.0–0.7)
Eosinophils Relative: 2.5 % (ref 0.0–5.0)
HCT: 38.1 % — ABNORMAL LOW (ref 39.0–52.0)
Hemoglobin: 12.9 g/dL — ABNORMAL LOW (ref 13.0–17.0)
Lymphocytes Relative: 19.6 % (ref 12.0–46.0)
Lymphs Abs: 1.4 10*3/uL (ref 0.7–4.0)
MCHC: 33.9 g/dL (ref 30.0–36.0)
MCV: 90.7 fl (ref 78.0–100.0)
Monocytes Absolute: 0.7 10*3/uL (ref 0.1–1.0)
Monocytes Relative: 9.3 % (ref 3.0–12.0)
Neutro Abs: 4.9 10*3/uL (ref 1.4–7.7)
Neutrophils Relative %: 67.9 % (ref 43.0–77.0)
Platelets: 162 10*3/uL (ref 150.0–400.0)
RBC: 4.2 Mil/uL — ABNORMAL LOW (ref 4.22–5.81)
RDW: 13.1 % (ref 11.5–15.5)
WBC: 7.2 10*3/uL (ref 4.0–10.5)

## 2019-10-08 LAB — BASIC METABOLIC PANEL WITH GFR
BUN: 15 mg/dL (ref 6–23)
CO2: 32 meq/L (ref 19–32)
Calcium: 9.4 mg/dL (ref 8.4–10.5)
Chloride: 104 meq/L (ref 96–112)
Creatinine, Ser: 0.96 mg/dL (ref 0.40–1.50)
GFR: 76.65 mL/min
Glucose, Bld: 105 mg/dL — ABNORMAL HIGH (ref 70–99)
Potassium: 4.9 meq/L (ref 3.5–5.1)
Sodium: 143 meq/L (ref 135–145)

## 2019-10-08 LAB — URINALYSIS, ROUTINE W REFLEX MICROSCOPIC
Bilirubin Urine: NEGATIVE
Hgb urine dipstick: NEGATIVE
Ketones, ur: NEGATIVE
Leukocytes,Ua: NEGATIVE
Nitrite: NEGATIVE
RBC / HPF: NONE SEEN
Specific Gravity, Urine: 1.01 (ref 1.000–1.030)
Total Protein, Urine: NEGATIVE
Urine Glucose: NEGATIVE
Urobilinogen, UA: 0.2 (ref 0.0–1.0)
pH: 6 (ref 5.0–8.0)

## 2019-10-08 LAB — HEMOGLOBIN A1C: Hgb A1c MFr Bld: 6.2 % (ref 4.6–6.5)

## 2019-10-08 LAB — HEPATIC FUNCTION PANEL
ALT: 13 U/L (ref 0–53)
AST: 17 U/L (ref 0–37)
Albumin: 4.1 g/dL (ref 3.5–5.2)
Alkaline Phosphatase: 73 U/L (ref 39–117)
Bilirubin, Direct: 0.1 mg/dL (ref 0.0–0.3)
Total Bilirubin: 0.4 mg/dL (ref 0.2–1.2)
Total Protein: 7 g/dL (ref 6.0–8.3)

## 2019-10-08 LAB — LIPASE: Lipase: 12 U/L (ref 11.0–59.0)

## 2019-10-08 MED ORDER — LINACLOTIDE 72 MCG PO CAPS: 72.0000 ug | ORAL_CAPSULE | Freq: Every day | ORAL | 5 refills | Status: DC

## 2019-10-08 MED ORDER — ALBUTEROL SULFATE 108 (90 BASE) MCG/ACT IN AEPB: 2.0000 | INHALATION_SPRAY | Freq: Four times a day (QID) | RESPIRATORY_TRACT | 11 refills | Status: DC | PRN

## 2019-10-08 NOTE — Patient Instructions (Addendum)
Please take all new medication as prescribed - the linzess  Please continue all other medications as before, and refills have been done if requested.  Please have the pharmacy call with any other refills you may need.  Please continue your efforts at being more active, low cholesterol diet, and weight control.  You are otherwise up to date with prevention measures today.  Please keep your appointments with your specialists as you may have planned  You will be contacted regarding the referral for: CT scan, and General Surgury  Please go to the LAB in the Basement (turn left off the elevator) for the tests to be done today  You will be contacted by phone if any changes need to be made immediately.  Otherwise, you will receive a letter about your results with an explanation, but please check with MyChart first.  Please remember to sign up for MyChart if you have not done so, as this will be important to you in the future with finding out test results, communicating by private email, and scheduling acute appointments online when needed.

## 2019-10-08 NOTE — Progress Notes (Signed)
Subjective:    Patient ID: Albert Wade, male    DOB: 03-22-1946, 73 y.o.   [name]: [id number]  HPI  Here with consipation c/o - ongoing x 4-5 yrs, can even 1 mo without BM; now off pain med except for percocet, Now off methadone per pain specialist but still with constipatoin.  Was advised for miralax but did not work well.  Had ongoing intermittent pain, dull without radation, n/v, fever, but with intermittent umbilical hernia noted with some discomfort in last few months.  Denies urinary symptoms such as dysuria, frequency, urgency, flank pain, hematuria or n/v, fever, chills. Denies worsening reflux, dysphagia, n/v, or blood.  Pt continues to have recurring LBP without change in severity, bowel or bladder change, fever, wt loss,  worsening LE /weakness, gait change or falls. But has persistent bilteral LE pain and niumbenss, will need lumbar surgury eventually - has f/u dec 14 at [facility].  Has also tried lactulose, but not tried linzess.   Pt denies fever, night sweats, loss of appetite, or other constitutional symptoms  Has had unintentional wt loss Wt Readings from Last 3 Encounters:  10/08/19 197 lb (89.4 kg)  07/18/19 199 lb 12.8 oz (90.6 kg)  05/09/19 213 lb 10 oz (96.9 kg)  Pt denies chest pain, increased sob or doe, wheezing, orthopnea, PND, increased LE swelling, palpitations, dizziness or syncope.   Pt denies polydipsia, polyuria  No other new complaints Past Medical History:  Diagnosis Date  . Acute bronchitis 01/01/2015  . Acute upper respiratory infection 05/19/2016  . Anxiety   . Arthritis    back,wrists,hx. previous fractures  . BPH (benign prostatic hypertrophy)   . BRONCHITIS, CHRONIC 04/29/2008   Qualifier: Diagnosis of  By: Halford Chessman MD, [place]    . CAD 10/16/2009   Qualifier: Diagnosis of  By: Lynden Ang    . CAD (coronary artery disease)    angioplasty   . Chronic back pain   . Chronic bronchitis   . 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 Confusion  04/27/2017  . Depression   . Encounter for preventative adult health care exam with abnormal findings 12/20/2013  . Fever 04/27/2017  . GERD (gastroesophageal reflux disease)   . Gynecomastia 05/12/2015  . H/O hiatal hernia    pts wife states pt does not have   . Heart murmur   . Hypercholesteremia   . Hyperglycemia 05/19/2016  . HYPERLIPIDEMIA 04/29/2008   Qualifier: Diagnosis of  By: Jim Like    . Hypertension   . Impaired glucose tolerance 12/20/2013  . Insomnia 04/27/2017  . Lumbosacral radiculopathy 10/14/2016  . MI (myocardial infarction) ([place])    mid-'90's  . MYOCARDIAL INFARCTION 04/29/2008   Qualifier: History of  By: Jim Like    . OSA (obstructive sleep apnea) 05/13/2011   Split night study 2011:  AHI 21/hr with obstructive and central events.  Placed on cpap and titrated to 13cm with reasonable control, but attempts to increase pressure increased the number of central events.  Medium resmed quattro full face mask used.    . Peripheral neuropathy    peripheral neuropathy  . Phimosis/adherent prepuce 10/21/2013  . Pneumonia   . Prostate cancer ([place])   . Shortness of breath 11/05/2010   Qualifier: Diagnosis of  By: Marilynne Halsted, RN, BSN, Jacquelyn    . Sleep apnea    mild-no cpap, unable to tolerate  . Umbilical hernia   . Umbilical hernia without obstruction and without gangrene 05/19/2016  . URI (upper  respiratory infection) 04/27/2017  . UTI (urinary tract infection) 12/01/2016  . Varicose veins   . Varicose veins of bilateral lower extremities with other complications [id number]  . Wheezing 01/01/2015   Past Surgical History:  Procedure Laterality Date  . ANGIOPLASTY    . APPENDECTOMY    . BACK SURGERY     x3  . CARDIAC CATHETERIZATION     11'11  . CIRCUMCISION N/A 10/21/2013   Procedure: CIRCUMCISION ADULT;  Surgeon: Bernestine Amass, MD;  Location: [facility];  Service: Urology;  Laterality: N/A;  . CYSTOSCOPY N/A 10/21/2013   Procedure: CYSTOSCOPY FLEXIBLE;   Surgeon: Bernestine Amass, MD;  Location: [facility];  Service: Urology;  Laterality: N/A;  . INSERTION OF MESH N/A 06/17/2016   Procedure: INSERTION OF MESH;  Surgeon: Excell Seltzer, MD;  Location: [facility];  Service: General;  Laterality: N/A;  . UMBILICAL HERNIA REPAIR N/A 06/17/2016   Procedure: REPAIR UMBILICAL HERNIA WITH MESH;  Surgeon: Excell Seltzer, MD;  Location: [facility];  Service: General;  Laterality: N/A;  . WRIST SURGERY Right     reports that he quit smoking about 23 years ago. His smoking use included cigarettes. He has a 20.00 pack-year smoking history. He has never used smokeless tobacco. He reports that he does not drink alcohol or use drugs. family history includes Emphysema in his father; Hyperlipidemia in his father and mother; Prostate cancer in his father. Allergies  Allergen Reactions  . Seroquel [Quetiapine] Hives   Current Outpatient Medications on File Prior to Visit  Medication Sig Dispense Refill  . ALPRAZolam (XANAX) 0.5 MG tablet Take 0.5 mg by mouth daily.     Marland Kitchen aspirin 81 MG tablet Take 81 mg by mouth daily.    . DULoxetine (CYMBALTA) 60 MG capsule Take 60 mg by mouth 2 (two) times daily.     . furosemide (LASIX) 40 MG tablet Take 40 mg by mouth daily as needed.    . mirabegron ER (MYRBETRIQ) 50 MG TB24 tablet Take 1 tablet (50 mg total) by mouth daily. 30 tablet 11  . oxyCODONE-acetaminophen (PERCOCET) 10-325 MG tablet Take 5 tablets by mouth daily.    . potassium chloride (K-DUR) 10 MEQ tablet TAKE 2 TABLETS BY MOUTH PER DAY ON DAYS TAKING FUROSEMIDE 180 tablet 3  . simvastatin (ZOCOR) 80 MG tablet TAKE 1 TABLET BY MOUTH EVERY EVENING 90 tablet 1  . SYMPROIC 0.2 MG TABS Take 0.2 mg by mouth daily.    . traZODone (DESYREL) 50 MG tablet TAKE 1/2 TO 1 TABLET BY MOUTH AT BEDTIMEAS NEEDED FOR SLEEP 90 tablet 0   No current facility-administered medications on file prior to visit.    Review of Systems  Constitutional: Negative for other unusual diaphoresis or  sweats HENT: Negative for ear discharge or swelling Eyes: Negative for other worsening visual disturbances Respiratory: Negative for stridor or other swelling  Gastrointestinal: Negative for worsening distension or other blood Genitourinary: Negative for retention or other urinary change Musculoskeletal: Negative for other MSK pain or swelling Skin: Negative for color change or other new lesions Neurological: Negative for worsening tremors and other numbness  Psychiatric/Behavioral: Negative for worsening agitation or other fatigue All otherwise neg per pt    Objective:   Physical Exam BP 126/78   Pulse 73   Temp 97.9 F (36.6 C) (Oral)   Ht 5' 10.5" (1.791 m)   Wt 197 lb (89.4 kg)   SpO2 98%   BMI 27.87 kg/m  VS noted,  Constitutional: Pt  appears in NAD HENT: Head: NCAT.  Right Ear: External ear normal.  Left Ear: External ear normal.  Eyes: . Pupils are equal, round, and reactive to light. Conjunctivae and EOM are normal Nose: without d/c or deformity Neck: Neck supple. Gross normal ROM Cardiovascular: Normal rate and regular rhythm.   Pulmonary/Chest: Effort normal and breath sounds without rales or wheezing.  Abd:  Soft, NT, ND, + BS, no organomegaly with reducible non tender small umbilical hernia Neurological: Pt is alert. At baseline orientation, motor grossly intact Skin: Skin is warm. No rashes, other new lesions, no LE edema Psychiatric: Pt behavior is normal without agitation  All otherwise neg per pt Lab Results  Component Value Date   WBC 7.2 10/08/2019   HGB 12.9 (L) 10/08/2019   HCT 38.1 (L) 10/08/2019   PLT 162.0 10/08/2019   GLUCOSE 105 (H) 10/08/2019   CHOL 114 06/18/2019   TRIG 104.0 06/18/2019   HDL 41.70 06/18/2019   LDLDIRECT 78.0 05/19/2016   LDLCALC 51 06/18/2019   ALT 13 10/08/2019   AST 17 10/08/2019   NA 143 10/08/2019   K 4.9 10/08/2019   CL 104 10/08/2019   CREATININE 0.96 10/08/2019   BUN 15 10/08/2019   CO2 32 10/08/2019   TSH  1.90 06/18/2019   PSA 0.27 06/18/2019   HGBA1C 6.2 10/08/2019        Assessment & Plan:

## 2019-10-09 ENCOUNTER — Telehealth: Payer: Self-pay | Admitting: Podiatry

## 2019-10-09 ENCOUNTER — Other Ambulatory Visit: Payer: Self-pay | Admitting: Urology

## 2019-10-09 NOTE — Telephone Encounter (Signed)
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 Please let him know that he should stop aspirin 5 days before and may start back on the day of surgery.

## 2019-10-09 NOTE — Telephone Encounter (Signed)
Albert Wade, please advise.

## 2019-10-09 NOTE — Telephone Encounter (Signed)
Left message informing pt and wife of Dr. Stephenie Acres 10/09/2019 12:03pm orders.

## 2019-10-09 NOTE — Telephone Encounter (Signed)
Pt's wife called to confirm the surgery date is 10/25/2019. She also wanted to know if he should stop taking his Asprin prior to his surgery and if so, how far in advance should he stop and when can he start taking it after surgery.

## 2019-10-09 NOTE — Telephone Encounter (Signed)
Usually three to five days.

## 2019-10-13 ENCOUNTER — Encounter: Payer: Self-pay | Admitting: Internal Medicine

## 2019-10-13 NOTE — Assessment & Plan Note (Signed)
[place] for trial linzess,  to f/u any worsening symptoms or concerns

## 2019-10-13 NOTE — Assessment & Plan Note (Signed)
Etiology unclear, for labs as ordered 

## 2019-10-13 NOTE — Assessment & Plan Note (Signed)
With mild intermittent symptoms,  Pt requests general surgury referral

## 2019-10-13 NOTE — Assessment & Plan Note (Addendum)
etiology unclear, ? Constipation vs other, for CT abd/pelvis  Note:  Total time for pt hx, exam, review of record with pt in the room, determination of diagnoses and plan for further eval and tx is > 40 min, with over 50% spent in coordination and counseling of patient including the differential dx, tx, further evaluation and other management of abd pain, wt loss, constipation, hyperglycemia

## 2019-10-13 NOTE — Assessment & Plan Note (Signed)
stable overall by history and exam, recent data reviewed with pt, and pt to continue medical treatment as before,  to f/u any worsening symptoms or concerns  

## 2019-10-15 ENCOUNTER — Telehealth: Payer: Self-pay | Admitting: Podiatry

## 2019-10-15 NOTE — Telephone Encounter (Signed)
Left voicemail letting wife know that pt does not need to come in before surgery to have any x-rays taken. Stated some may be taken the day of the surgery and if not, we would get some at his first postop visit. Told her I've already scheduled his first four postop visits in [place] and that the pt could see those on his MyChart. Told her to call me directly with any other questions. Also told her it said [place] because we schedule the surgeries from our [place] office.

## 2019-10-15 NOTE — Telephone Encounter (Signed)
My husband is scheduled for surgery on 10/302020 and he needs to know if he needs to get another x-ray before his surgery. According to this message, they have him going to [facility] for an x-ray. We just wanted to know why we couldn't get this done out here in [place] and sent to [place] since we live in [place].

## 2019-10-18 ENCOUNTER — Other Ambulatory Visit: Payer: Self-pay

## 2019-10-18 ENCOUNTER — Encounter (HOSPITAL_COMMUNITY): Payer: Medicare Other

## 2019-10-18 ENCOUNTER — Ambulatory Visit
Admission: RE | Admit: 2019-10-18 | Discharge: 2019-10-18 | Disposition: A | Discharge status: Home / Self Care | Payer: Medicare Other | Source: Ambulatory Visit | Attending: Internal Medicine | Admitting: Internal Medicine

## 2019-10-18 DIAGNOSIS — N132 Hydronephrosis with renal and ureteral calculous obstruction: Secondary | ICD-10-CM | POA: Diagnosis not present

## 2019-10-18 DIAGNOSIS — R634 Abnormal weight loss: Secondary | ICD-10-CM | POA: Insufficient documentation

## 2019-10-18 MED ORDER — IOHEXOL 300 MG/ML  SOLN
100.0000 mL | Freq: Once | INTRAMUSCULAR | Status: AC | PRN
  Administered 2019-10-18: 100 mL via INTRAVENOUS

## 2019-10-19 ENCOUNTER — Other Ambulatory Visit: Payer: Self-pay | Admitting: Internal Medicine

## 2019-10-19 DIAGNOSIS — N201 Calculus of ureter: Secondary | ICD-10-CM

## 2019-10-19 DIAGNOSIS — N1330 Unspecified hydronephrosis: Secondary | ICD-10-CM

## 2019-10-24 ENCOUNTER — Other Ambulatory Visit: Payer: Self-pay | Admitting: Podiatry

## 2019-10-24 MED ORDER — ONDANSETRON HCL 4 MG PO TABS: 4.0000 mg | ORAL_TABLET | Freq: Three times a day (TID) | ORAL | 0 refills | Status: DC | PRN

## 2019-10-24 MED ORDER — OXYCODONE-ACETAMINOPHEN 10-325 MG PO TABS: 1.0000 | ORAL_TABLET | Freq: Three times a day (TID) | ORAL | 0 refills | Status: AC | PRN

## 2019-10-24 MED ORDER — CEPHALEXIN 500 MG PO CAPS: 500.0000 mg | ORAL_CAPSULE | Freq: Three times a day (TID) | ORAL | 0 refills | Status: DC

## 2019-10-25 ENCOUNTER — Encounter: Payer: Self-pay | Admitting: Podiatry

## 2019-10-25 DIAGNOSIS — M25571 Pain in right ankle and joints of right foot: Secondary | ICD-10-CM | POA: Diagnosis not present

## 2019-10-25 DIAGNOSIS — M779 Enthesopathy, unspecified: Secondary | ICD-10-CM | POA: Diagnosis not present

## 2019-10-25 DIAGNOSIS — M21541 Acquired clubfoot, right foot: Secondary | ICD-10-CM | POA: Diagnosis not present

## 2019-10-25 DIAGNOSIS — M216X1 Other acquired deformities of right foot: Secondary | ICD-10-CM | POA: Diagnosis not present

## 2019-10-25 DIAGNOSIS — M2041 Other hammer toe(s) (acquired), right foot: Secondary | ICD-10-CM | POA: Diagnosis not present

## 2019-10-25 DIAGNOSIS — M2011 Hallux valgus (acquired), right foot: Secondary | ICD-10-CM | POA: Diagnosis not present

## 2019-10-25 DIAGNOSIS — R200 Anesthesia of skin: Secondary | ICD-10-CM | POA: Diagnosis not present

## 2019-10-26 ENCOUNTER — Ambulatory Visit: Admit: 2019-10-26 | Payer: Medicare Other | Admitting: Urology

## 2019-10-26 SURGERY — BOTOX INJECTION
Anesthesia: Choice

## 2019-10-30 ENCOUNTER — Encounter: Payer: Self-pay | Admitting: Podiatry

## 2019-10-30 ENCOUNTER — Ambulatory Visit (INDEPENDENT_AMBULATORY_CARE_PROVIDER_SITE_OTHER): Payer: Medicare Other

## 2019-10-30 ENCOUNTER — Other Ambulatory Visit: Payer: Self-pay

## 2019-10-30 ENCOUNTER — Ambulatory Visit (INDEPENDENT_AMBULATORY_CARE_PROVIDER_SITE_OTHER): Payer: Self-pay | Admitting: Podiatry

## 2019-10-30 DIAGNOSIS — M2011 Hallux valgus (acquired), right foot: Secondary | ICD-10-CM

## 2019-10-30 DIAGNOSIS — C61 Malignant neoplasm of prostate: Secondary | ICD-10-CM

## 2019-10-30 DIAGNOSIS — M2041 Other hammer toe(s) (acquired), right foot: Secondary | ICD-10-CM | POA: Diagnosis not present

## 2019-10-30 DIAGNOSIS — M216X1 Other acquired deformities of right foot: Secondary | ICD-10-CM

## 2019-10-30 DIAGNOSIS — R972 Elevated prostate specific antigen [PSA]: Secondary | ICD-10-CM

## 2019-10-30 DIAGNOSIS — Z9889 Other specified postprocedural states: Secondary | ICD-10-CM

## 2019-10-30 NOTE — Progress Notes (Signed)
He presents today for his first postop visit date of surgery 10/25/2019 status post Liane Comber bunionectomy right foot second metatarsal osteotomy right foot hammertoe repair #2 #3 of the right foot.  He states that is been a little sore but it has not been too bad.  He denies fever chills nausea vomiting muscle aches and pains.  His wife states that his temperature went up to 100.3 F.  Objective: Vital signs are stable alert oriented x3 Dressel dressing was intact once removed demonstrates considerable amount of bleeding to the surgical foot.  No active bleeding noticed with exception of small area overlying the second PIPJ.  Radiographs taken today demonstrate all internal fixation in good position and intact.  All sutures are intact margins are coapting.  Assessment: Well-healing surgical foot right x1 week.  Plan: Redressed today dressed a compressive dressing follow-up with me in 1 week for suture removal possibly.  They may not be ready because of all the swelling.

## 2019-11-04 ENCOUNTER — Other Ambulatory Visit: Payer: Medicare Other

## 2019-11-04 ENCOUNTER — Other Ambulatory Visit: Payer: Self-pay

## 2019-11-04 DIAGNOSIS — R972 Elevated prostate specific antigen [PSA]: Secondary | ICD-10-CM

## 2019-11-04 DIAGNOSIS — C61 Malignant neoplasm of prostate: Secondary | ICD-10-CM

## 2019-11-05 LAB — PSA: Prostate Specific Ag, Serum: <0.1 ng/mL (ref 0.0–4.0)

## 2019-11-06 ENCOUNTER — Other Ambulatory Visit: Payer: Medicare Other | Admitting: Podiatry

## 2019-11-07 ENCOUNTER — Telehealth: Payer: Self-pay | Admitting: Internal Medicine

## 2019-11-07 ENCOUNTER — Other Ambulatory Visit: Payer: Self-pay

## 2019-11-07 ENCOUNTER — Ambulatory Visit: Payer: Medicare Other | Admitting: Urology

## 2019-11-07 ENCOUNTER — Encounter: Payer: Self-pay | Admitting: Urology

## 2019-11-07 VITALS — BP 168/82 | HR 87 | Ht 70.5 in | Wt 185.0 lb

## 2019-11-07 DIAGNOSIS — N3281 Overactive bladder: Secondary | ICD-10-CM

## 2019-11-07 DIAGNOSIS — N201 Calculus of ureter: Secondary | ICD-10-CM | POA: Diagnosis not present

## 2019-11-07 DIAGNOSIS — C61 Malignant neoplasm of prostate: Secondary | ICD-10-CM | POA: Diagnosis not present

## 2019-11-07 MED ORDER — LEUPROLIDE ACETATE (6 MONTH) 45 MG ~~LOC~~ KIT
45.0000 mg | PACK | Freq: Once | SUBCUTANEOUS | Status: AC
  Administered 2019-11-07: 45 mg via SUBCUTANEOUS

## 2019-11-07 NOTE — Progress Notes (Signed)
Eligard SubQ Injection   Due to Prostate Cancer patient is present today for a Eligard Injection.  Medication: Eligard 6 month Dose: 45 mg  Location: right lower abdominal tissue Lot: [id number] Exp: 05/2021  Patient tolerated well, no complications were noted  Performed by: Verlene Mayer, CMA

## 2019-11-07 NOTE — Telephone Encounter (Signed)
Patient wanted chart to be marked that he had his flu shot in September at [facility] on [address].

## 2019-11-07 NOTE — Progress Notes (Signed)
11/07/2019 1:51 PM   Albert Wade 21-Apr-1946 NP:[id number]  Referring provider: Biagio Borg, MD [address] [place],  [place] [address]  Chief Complaint  Patient presents with   Prostate Cancer    HPI: 73 year old male who returns today for routine follow-up.  He was last seen in 04/2019 returns for 40-month follow-up.  He is followed for prostate cancer, OAB/pelvic floor dysfunction and today he reports that he has a new diagnosis of ureteral calculi.  Since last visit, he has undergone right foot surgery.  Prostate cancer: He was diagnosed with prostate cancer in 11/2016. PSA at the time of biopsy was 17.6. He has 6 of 12 cores positive. 2 of the cores of Gleason 4+4 = 8 disease. 3of the cores have Gleason 4+3 = 7 disease. One core was Gleason 3+3 = 6 disease. All cores that were positive ranged from 50 to 100% volume disease. TRUS vol 21 g.  He completedIMRT as of 03/2017.   Recent PSAundetectable as of11/2020.   He is currently on Lupron which she continues today, plan to continue this through 04/2020.   Tolerating Lupron well.  No significant hot flashes or side effects.  OAB/ Pelvic floor dysfunction He has a personal history of severe urinary overactivity and urge incontinence.  He is tried multiple medications including Myrbetriq 25, 50 mg, oxybutynin and most recently Vesicare without any dramatic improvement in his severe urinary symptoms.    He did undergo urodynamics which shows engagement of his pelvic floor musculature during voiding consistent with pelvic floor dysfunction but given COVID-19 pandemic, physical therapy has been unavailable to him.  He had strongly consider Botox after last visit but ultimately decided not to pursue this.  He is also not been seen and evaluated in physical therapy.  Since last visit, he has been taking Vesicare and he reports that his symptoms are significantly more improved.  Its been quite sometime since he "peed  himself" with which he is very pleased.  He has had significantly decreased urinary frequency and urgency.  Overall, he satisfied currently with the symptoms.  Right ureteral calculi CT scan ordered by primary care physician in October primarily secondary to 30 pound unintentional weight loss.  He also complaining of some constipation and nonspecific abdominal pain.  CT abdomen pelvis with contrast on 10/18/2019 showed incidental 4 mm right distal ureteral calculi x2 with hydroureteronephrosis to this level.  He reports he has no flank pain, dysuria, gross hematuria or personal history of kidney stones prior to this.  He has not seen the stones pass.   PMH: Past Medical History:  Diagnosis Date   Acute bronchitis 01/01/2015   Acute upper respiratory infection 05/19/2016   Anxiety    Arthritis    back,wrists,hx. 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   Confusion 04/27/2017   Depression    Encounter for preventative adult health care exam with abnormal findings 12/20/2013   Fever 04/27/2017   GERD (gastroesophageal reflux disease)    Gynecomastia 05/12/2015   H/O hiatal hernia    pts wife states pt does not have  Heart murmur    Hypercholesteremia    Hyperglycemia 05/19/2016   HYPERLIPIDEMIA 04/29/2008   Qualifier: Diagnosis of  By: Jim Like     Impaired glucose tolerance 12/20/2013   Insomnia 04/27/2017   Lumbosacral radiculopathy 10/14/2016   MI (myocardial infarction) [facility])    mid-'90's   MYOCARDIAL INFARCTION 04/29/2008   Qualifier: History of  By: Jim Like     OSA (obstructive sleep apnea) 05/13/2011   Split night study  2011:  AHI 21/hr with obstructive and central events.  Placed on cpap and titrated to 13cm with reasonable control, but attempts to increase pressure increased the number of central events.  Medium resmed quattro full face mask used.     Peripheral neuropathy    peripheral neuropathy   Phimosis/adherent prepuce 10/21/2013   Pneumonia    Prostate cancer ([place])    Shortness of breath 11/05/2010   Qualifier: Diagnosis of  By: Marilynne Halsted, RN, BSN, Jacquelyn     Sleep apnea    mild-no cpap, unable to tolerate   Umbilical hernia    Umbilical hernia without obstruction and without gangrene 05/19/2016   URI (upper respiratory infection) 04/27/2017   UTI (urinary tract infection) 12/01/2016   Varicose veins    Varicose veins of bilateral lower extremities with other complications [id number]   Wheezing 01/01/2015    Surgical History: Past Surgical History:  Procedure Laterality Date   ANGIOPLASTY     APPENDECTOMY     BACK SURGERY     x3   CARDIAC CATHETERIZATION     11'11   CIRCUMCISION N/A 10/21/2013   Procedure: CIRCUMCISION ADULT;  Surgeon: Bernestine Amass, MD;  Location: [facility];  Service: Urology;  Laterality: N/A;   CYSTOSCOPY N/A 10/21/2013   Procedure: CYSTOSCOPY FLEXIBLE;  Surgeon: Bernestine Amass, MD;  Location: [facility];  Service: Urology;  Laterality: N/A;   INSERTION OF MESH N/A 06/17/2016   Procedure: INSERTION OF MESH;  Surgeon: Excell Seltzer, MD;  Location: [facility];  Service: General;  Laterality: N/A;   UMBILICAL HERNIA REPAIR N/A 06/17/2016   Procedure: REPAIR UMBILICAL HERNIA WITH MESH;  Surgeon: Excell Seltzer, MD;  Location: [facility];  Service: General;  Laterality: N/A;   WRIST SURGERY Right     Home Medications:  Allergies as of 11/07/2019      Reactions   Seroquel [quetiapine] Hives      Medication List       Accurate as of November 07, 2019  1:51 PM. If you have any questions, ask your nurse or doctor.        STOP taking these medications     cephALEXin 500 MG capsule Commonly known as: KEFLEX Stopped by: Hollice Espy, MD   mirabegron ER 50 MG Tb24 tablet Commonly known as: MYRBETRIQ Stopped by: Hollice Espy, MD   ondansetron 4 MG tablet Commonly known as: Zofran Stopped by: Hollice Espy, MD     TAKE these medications   Albuterol Sulfate 108 (90 Base) MCG/ACT Aepb Inhale 2 puffs into the lungs 4 (four) times daily as needed.   ALPRAZolam 0.5 MG tablet Commonly known as: XANAX Take 0.5 mg by mouth daily.   aspirin 81 MG tablet Take 81 mg by mouth daily.   DULoxetine 60 MG capsule Commonly known as: CYMBALTA Take 60 mg by mouth 2 (two) times daily.   furosemide 40 MG tablet Commonly known as: LASIX Take 40 mg by mouth daily as needed.   linaclotide 72 MCG capsule Commonly known as: Linzess Take 1  capsule (72 mcg total) by mouth daily before breakfast.   potassium chloride 10 MEQ tablet Commonly known as: KLOR-CON TAKE 2 TABLETS BY MOUTH PER DAY ON DAYS TAKING FUROSEMIDE   simvastatin 80 MG tablet Commonly known as: ZOCOR TAKE 1 TABLET BY MOUTH EVERY EVENING   solifenacin 10 MG tablet Commonly known as: VESICARE TAKE 1 TABLET BY MOUTH DAILY   Symproic 0.2 MG Tabs Generic drug: Naldemedine Tosylate Take 0.2 mg by mouth daily.   traZODone 50 MG tablet Commonly known as: DESYREL TAKE 1/2 TO 1 TABLET BY MOUTH AT BEDTIMEAS NEEDED FOR SLEEP       Allergies:  Allergies  Allergen Reactions   Seroquel [Quetiapine] Hives    Family History: Family History  Problem Relation Age of Onset   Prostate cancer Father    Emphysema Father    Hyperlipidemia Father    Hyperlipidemia Mother     Social History:  reports that he quit smoking about 23 years ago. His smoking use included cigarettes. He has a 20.00 pack-year smoking history. He has never used smokeless tobacco. He reports that he does not drink alcohol or use drugs.  ROS: UROLOGY Frequent Urination?: Yes Hard to postpone  urination?: No Burning/pain with urination?: No Get up at night to urinate?: Yes Leakage of urine?: No Urine stream starts and stops?: No Trouble starting stream?: No Do you have to strain to urinate?: No Blood in urine?: No Urinary tract infection?: No Sexually transmitted disease?: No Injury to kidneys or bladder?: No Painful intercourse?: No Weak stream?: No Erection problems?: No Penile pain?: No  Gastrointestinal Nausea?: No Vomiting?: No Indigestion/heartburn?: No Diarrhea?: No Constipation?: Yes  Constitutional Fever: No Night sweats?: No Weight loss?: No Fatigue?: No  Skin Skin rash/lesions?: No Itching?: No  Eyes Blurred vision?: No Double vision?: No  Ears/Nose/Throat Sore throat?: No Sinus problems?: No  Hematologic/Lymphatic Swollen glands?: No Easy bruising?: No  Cardiovascular Leg swelling?: No Chest pain?: No  Respiratory Cough?: No Shortness of breath?: No  Endocrine Excessive thirst?: No  Musculoskeletal Back pain?: No Joint pain?: No  Neurological Headaches?: No Dizziness?: No  Psychologic Depression?: No Anxiety?: No  Physical Exam: BP (!) 168/82    Pulse 87    Ht 5' 10.5" (1.791 m)    Wt 185 lb (83.9 kg)    BMI 26.17 kg/m   Constitutional:  Alert and oriented, No acute distress. HEENT: [place] AT, moist mucus membranes.  Trachea midline, no masses. Cardiovascular: No clubbing, cyanosis, or edema. Respiratory: Normal respiratory effort, no increased work of breathing. GI: Abdomen is soft, nontender, nondistended, no abdominal masses Extremities/wearing boot on right lower leg. Skin: No rashes, bruises or suspicious lesions. Neurologic: Grossly intact, no focal deficits, moving all 4 extremities. Psychiatric: Normal mood and affect.  Laboratory Data: Lab Results  Component Value Date   WBC 7.2 10/08/2019   HGB 12.9 (L) 10/08/2019   HCT 38.1 (L) 10/08/2019   MCV 90.7 10/08/2019   PLT 162.0 10/08/2019    Lab Results   Component Value Date   CREATININE 0.96 10/08/2019   Urinalysis    Component Value Date/Time   COLORURINE YELLOW 10/08/2019 1110   APPEARANCEUR CLEAR 10/08/2019 1110   APPEARANCEUR Clear 08/01/2018 0957   LABSPEC 1.010 10/08/2019 1110   PHURINE 6.0 10/08/2019 1110   GLUCOSEU NEGATIVE 10/08/2019 1110   HGBUR NEGATIVE 10/08/2019 [place] 10/08/2019 1110   BILIRUBINUR Negative 08/01/2018 0957   KETONESUR NEGATIVE 10/08/2019 1110   PROTEINUR Negative 08/01/2018 0957  PROTEINUR NEGATIVE 12/01/2016 1121   UROBILINOGEN 0.2 10/08/2019 1110   NITRITE NEGATIVE 10/08/2019 1110   LEUKOCYTESUR NEGATIVE 10/08/2019 1110    Pertinent Imaging: CLINICAL DATA:  Weight loss, constipation, abdominal pain  EXAM: CT ABDOMEN AND PELVIS WITH CONTRAST  TECHNIQUE: Multidetector CT imaging of the abdomen and pelvis was performed using the standard protocol following bolus administration of intravenous contrast.  CONTRAST:  121mL OMNIPAQUE IOHEXOL 300 MG/ML  SOLN  COMPARISON:  12/21/2016  FINDINGS: Lower chest: Lung bases are clear.  Hepatobiliary: Liver is within normal limits.  Gallbladder is unremarkable. No intrahepatic or extrahepatic duct dilatation.  Pancreas: Within normal limits.  Spleen: Within normal limits.  Adrenals/Urinary Tract: Adrenal glands are within normal limits.  Kidneys are within normal limits. Moderate right hydroureteronephrosis. Two associated distal right ureteral calculi measuring 4 mm (coronal image 114).  Bladder is within normal limits.  Stomach/Bowel: Stomach is within normal limits.  No evidence of bowel obstruction.  Appendix is not discretely visualized.  Moderate colonic stool burden, suggesting constipation.  Vascular/Lymphatic: No evidence of abdominal aortic aneurysm.  Atherosclerotic calcifications of the abdominal aorta and branch vessels.  No suspicious abdominopelvic  lymphadenopathy.  Reproductive: Brachytherapy seeds in the prostate.  Other: No abdominopelvic ascites.  Musculoskeletal: Degenerative changes of the visualized thoracolumbar spine. Prior ray cage fusion at L4-5 and L5-S1.  IMPRESSION: Two distal right ureteral calculi measuring 4 mm. Associated moderate right hydroureteronephrosis.  Moderate colonic stool burden, suggesting constipation.   Electronically Signed   By: Julian Hy M.D.   On: 10/18/2019 17:08    CT scan was personally reviewed today.  Agree with radiologic interpretation.  This is also directly compared to CT scan from 2017 which time no stones were identified.  Assessment & Plan:    1. Prostate cancer ([place]) High risk prostate cancer status post IMRT on ADT PSA is undetectable Last dose of Eliguard x71-month given today Follow-up in 6 months with PSA prior - leuprolide (6 Month) (ELIGARD) injection 45 mg - US Renal; Future  2. Right ureteral calculus Incidental finding on CT scan, asymptomatic  Given the size and location of the stone, he should pass the spontaneously however is not seen in the past Given that he has no symptoms and never had symptoms, would recommend follow-up imaging to ensure that the stones are in fact pass.  Plan for renal ultrasound, will call with results.  If he still has persistent hydroureteronephrosis, the stones may require surgical intervention given failure to pass - US Renal; Future  3. OAB (overactive bladder) Urinary symptoms have improved currently on Vesicare with adequate symptom control We will continue to monitor    Return in about 6 months (around 05/06/2020) for PSA.  Hollice Espy, MD  [facility] [address] [place],  [address] [phone]

## 2019-11-08 ENCOUNTER — Ambulatory Visit (INDEPENDENT_AMBULATORY_CARE_PROVIDER_SITE_OTHER): Payer: Medicare Other | Admitting: Podiatry

## 2019-11-08 ENCOUNTER — Other Ambulatory Visit: Payer: Self-pay | Admitting: Internal Medicine

## 2019-11-08 ENCOUNTER — Encounter: Payer: Self-pay | Admitting: Podiatry

## 2019-11-08 DIAGNOSIS — Z9889 Other specified postprocedural states: Secondary | ICD-10-CM

## 2019-11-08 DIAGNOSIS — M2011 Hallux valgus (acquired), right foot: Secondary | ICD-10-CM

## 2019-11-12 ENCOUNTER — Encounter: Payer: Self-pay | Admitting: Internal Medicine

## 2019-11-12 NOTE — Progress Notes (Signed)
Subjective:  Patient presents today status post bunionectomy with hammertoe repair digits 2-4 right. DOS: 10/25/2019. He states he is doing well. He reports a decrease in pain. There are no modifying factors noted. He has been using the CAM boot as directed. Patient is here for further evaluation and treatment.    Past Medical History:  Diagnosis Date  . Acute bronchitis 01/01/2015  . Acute upper respiratory infection 05/19/2016  . Anxiety   . Arthritis    back,wrists,hx. previous fractures  . BPH (benign prostatic hypertrophy)   . BRONCHITIS, CHRONIC 04/29/2008   Qualifier: Diagnosis of  By: Halford Chessman MD, Vineet    . CAD 10/16/2009   Qualifier: Diagnosis of  By: Lynden Ang    . CAD (coronary artery disease)    angioplasty   . Chronic back pain   . Chronic bronchitis   . 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 Confusion 04/27/2017  . Depression   . Encounter for preventative adult health care exam with abnormal findings 12/20/2013  . Fever 04/27/2017  . GERD (gastroesophageal reflux disease)   . Gynecomastia 05/12/2015  . H/O hiatal hernia    pts wife states pt does not have   . Heart murmur   . Hypercholesteremia   . Hyperglycemia 05/19/2016  . HYPERLIPIDEMIA 04/29/2008   Qualifier: Diagnosis of  By: Jim Like    . Impaired glucose tolerance 12/20/2013  . Insomnia 04/27/2017  . Lumbosacral radiculopathy 10/14/2016  . MI (myocardial infarction) ([place])    mid-'90's  . MYOCARDIAL INFARCTION 04/29/2008   Qualifier: History of  By: Jim Like    . OSA (obstructive sleep apnea) 05/13/2011   Split night study 2011:  AHI 21/hr with obstructive and central events.  Placed on cpap and titrated to 13cm with reasonable control, but attempts to increase pressure increased the number of central events.  Medium resmed quattro full face mask used.    . Peripheral neuropathy    peripheral neuropathy  . Phimosis/adherent prepuce 10/21/2013  . Pneumonia   . Prostate  cancer ([place])   . Shortness of breath 11/05/2010   Qualifier: Diagnosis of  By: Marilynne Halsted, RN, BSN, Jacquelyn    . Sleep apnea    mild-no cpap, unable to tolerate  . Umbilical hernia   . Umbilical hernia without obstruction and without gangrene 05/19/2016  . URI (upper respiratory infection) 04/27/2017  . UTI (urinary tract infection) 12/01/2016  . Varicose veins   . Varicose veins of bilateral lower extremities with other complications [id number]  . Wheezing 01/01/2015      Objective/Physical Exam Neurovascular status intact.  Skin incisions appear to be well coapted with sutures and staples intact. No sign of infectious process noted. No dehiscence. No active bleeding noted. Moderate edema noted to the surgical extremity.  Assessment: 1. s/p bunionectomy with hammertoe repair digits 2-4 right. DOS: 10/25/2019   Plan of Care:  1. Patient was evaluated. X-rays reviewed 2. Sutures removed.  3. Dressing changed.  4. Continue weightbearing in CAM boot.  5. Return to clinic in 3 weeks with Dr. Milinda Pointer.    Edrick Kins, DPM [facility]  Dr. Edrick Kins, [place]                                        [place], [place] [address]  Office [phone]  Fax [phone]

## 2019-11-12 NOTE — Telephone Encounter (Signed)
Appointment has been canceled.

## 2019-11-18 ENCOUNTER — Other Ambulatory Visit: Payer: Self-pay

## 2019-11-18 ENCOUNTER — Ambulatory Visit (INDEPENDENT_AMBULATORY_CARE_PROVIDER_SITE_OTHER): Payer: Medicare Other | Admitting: Podiatry

## 2019-11-18 ENCOUNTER — Ambulatory Visit (INDEPENDENT_AMBULATORY_CARE_PROVIDER_SITE_OTHER): Payer: Medicare Other

## 2019-11-18 DIAGNOSIS — Z9889 Other specified postprocedural states: Secondary | ICD-10-CM

## 2019-11-18 DIAGNOSIS — M2011 Hallux valgus (acquired), right foot: Secondary | ICD-10-CM | POA: Diagnosis not present

## 2019-11-18 DIAGNOSIS — M2041 Other hammer toe(s) (acquired), right foot: Secondary | ICD-10-CM

## 2019-11-18 NOTE — Progress Notes (Signed)
He presents today date of surgery 10/25/2019 status post Liane Comber bunionectomy second metatarsal osteotomy hammertoe repair #2 #3 in the right foot.  States that I am doing really good pain is gone and swelling is down.  Objective: Vital signs are stable he is alert and oriented x3.  There is minimal edema no erythema cellulitis drainage or odor still has some dry eschar overlying each incision site but this appears to be loose and appears to be normal skin beneath it.  Radiographs taken today demonstrate well-healing [place] bunion repair second metatarsal osteotomy hammertoe repair #2 #3.  Assessment: Well-healing surgical foot.  Plan: We will allow him to start washing this foot and soaking it so the eschar will come off normal and request that he get into a compression anklet in his [place] shoe I will follow-up with him in a couple weeks at which time we hope to get into a regular tennis shoe.  X-rays can be done at that time.

## 2019-11-27 ENCOUNTER — Ambulatory Visit
Admission: RE | Admit: 2019-11-27 | Discharge: 2019-11-27 | Disposition: A | Discharge status: Home / Self Care | Payer: Medicare Other | Source: Ambulatory Visit | Attending: Urology | Admitting: Urology

## 2019-11-27 ENCOUNTER — Other Ambulatory Visit: Payer: Self-pay

## 2019-11-27 DIAGNOSIS — N201 Calculus of ureter: Secondary | ICD-10-CM | POA: Diagnosis present

## 2019-11-27 DIAGNOSIS — C61 Malignant neoplasm of prostate: Secondary | ICD-10-CM | POA: Insufficient documentation

## 2019-11-27 DIAGNOSIS — N132 Hydronephrosis with renal and ureteral calculous obstruction: Secondary | ICD-10-CM | POA: Diagnosis not present

## 2019-11-28 DIAGNOSIS — M48061 Spinal stenosis, lumbar region without neurogenic claudication: Secondary | ICD-10-CM | POA: Diagnosis not present

## 2019-11-28 DIAGNOSIS — M25579 Pain in unspecified ankle and joints of unspecified foot: Secondary | ICD-10-CM | POA: Diagnosis not present

## 2019-11-28 DIAGNOSIS — K5909 Other constipation: Secondary | ICD-10-CM | POA: Diagnosis not present

## 2019-11-28 DIAGNOSIS — G894 Chronic pain syndrome: Secondary | ICD-10-CM | POA: Diagnosis not present

## 2019-11-29 ENCOUNTER — Encounter: Payer: Self-pay | Admitting: Urology

## 2019-12-02 ENCOUNTER — Ambulatory Visit (INDEPENDENT_AMBULATORY_CARE_PROVIDER_SITE_OTHER): Payer: Medicare Other | Admitting: Podiatry

## 2019-12-02 ENCOUNTER — Encounter: Payer: Self-pay | Admitting: Podiatry

## 2019-12-02 ENCOUNTER — Telehealth: Payer: Self-pay | Admitting: Urology

## 2019-12-02 ENCOUNTER — Ambulatory Visit (INDEPENDENT_AMBULATORY_CARE_PROVIDER_SITE_OTHER): Payer: Medicare Other

## 2019-12-02 DIAGNOSIS — M2011 Hallux valgus (acquired), right foot: Secondary | ICD-10-CM

## 2019-12-02 DIAGNOSIS — Z9889 Other specified postprocedural states: Secondary | ICD-10-CM

## 2019-12-02 MED ORDER — CEPHALEXIN 500 MG PO CAPS: 500.0000 mg | ORAL_CAPSULE | Freq: Four times a day (QID) | ORAL | 0 refills | Status: DC

## 2019-12-02 NOTE — Telephone Encounter (Signed)
Patient informed-scheduled appointment with Larene Beach for 12/04/19-patient verbalized understanding.

## 2019-12-02 NOTE — Telephone Encounter (Signed)
Pt called asking about U/S results from 12/2.

## 2019-12-02 NOTE — Progress Notes (Signed)
This patient presents the office following foot surgery on his right foot on October 25, 2019.  Dr. Milinda Pointer performed an [facility] with screw fixation a metatarsal osteotomy second with screw fixation and hammertoe repair 2 3 with screw right foot.  Patient states that he is doing well but there is still swelling and redness noted on the top of his right foot he says it is doing well and he is not experiencing any pain he says he is wearing his compression sock and walking with his surgical shoe patient presents the office today for an evaluation of his right foot.  Neurovascular status intact for this patient the wound is healing at the incisions over the first and second metatarsal right foot there is mild swelling and redness noted around the incisions patient has good range of motion of the first MPJ in the second MPJ right foot.  The second and third toes are healing nicely.  S/P foot surgery.  Return office visit.  X-rays were taken for this patient revealing good positioning noted at the osteotomy sites and the screws intact in the second and third digits right foot.  Patient was told to continue to ambulate with his surgical shoe.  Discussed the surgery with this patient.  Decided to treat him with cephalexin 500 mg since he tells me he has a previous history of infections.  Patient was told to return to the office in 4 weeks for further evaluation and treatment by Dr. Milinda Pointer.  Gardiner Barefoot DPM

## 2019-12-02 NOTE — Telephone Encounter (Signed)
He still has hydronephrosis meaning his stones have not passed.  He needs to have surgery for these.  Please have him see either Larene Beach or Sam to book either shockwave or ureteroscopy with me.  Hollice Espy, MD

## 2019-12-03 NOTE — Progress Notes (Signed)
12/04/2019 9:22 AM   [name] [birth year] NP:[id number]  Referring provider: Biagio Borg, MD [address] [place],  [place] [address]  Chief Complaint  Patient presents with  . Nephrolithiasis    HPI: [name] is a 73 year old male with prostate cancer, OAB/pelvic floor dysfunction and a right ureteral calculi who presents today for persistent hydronephrosis.    Prostate cancer High risk prostate cancer status post IMRT on ADT.  PSA is undetectable.  ADT through 04/2020.  OAB/pelvic floor dysfunction Urodynamics which shows engagement of his pelvic floor musculature during voiding consistent with pelvic floor dysfunction.  Satisfied with Vesicare at this time.   Right ureteral stone CT abdomen pelvis with contrast on 10/18/2019 showed incidental 4 mm right distal ureteral calculi x2 with hydroureteronephrosis to this level.  RUS 11/27/2019 Moderate RIGHT hydronephrosis, similar to prior CT exam.  Tiny distal RIGHT ureteral calculus seen on the prior CT is not sonographically identified.  Remainder of exam unremarkable.   He states that he has not passed any fragments.  He continues to have right-sided flank pain.  His UA today is benign.  KUB 12/04/2019 stones not visualized.  PMH: Past Medical History:  Diagnosis Date  . Acute bronchitis 01/01/2015  . Acute upper respiratory infection 05/19/2016  . Anxiety   . Arthritis    back,wrists,hx. previous fractures  . BPH (benign prostatic hypertrophy)   . BRONCHITIS, CHRONIC 04/29/2008   Qualifier: Diagnosis of  By: Halford Chessman MD, Vineet    . CAD 10/16/2009   Qualifier: Diagnosis of  By: Lynden Ang    . CAD (coronary artery disease)    angioplasty   . Chronic back pain   . Chronic bronchitis   . 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 Confusion 04/27/2017  . Depression   . Encounter for preventative adult health care exam with abnormal findings 12/20/2013  . Fever 04/27/2017  . GERD (gastroesophageal  reflux disease)   . Gynecomastia 05/12/2015  . H/O hiatal hernia    pts wife states pt does not have   . Heart murmur   . Hypercholesteremia   . Hyperglycemia 05/19/2016  . HYPERLIPIDEMIA 04/29/2008   Qualifier: Diagnosis of  By: Jim Like    . Impaired glucose tolerance 12/20/2013  . Insomnia 04/27/2017  . Lumbosacral radiculopathy 10/14/2016  . MI (myocardial infarction) ([place])    mid-'90's  . MYOCARDIAL INFARCTION 04/29/2008   Qualifier: History of  By: Jim Like    . OSA (obstructive sleep apnea) 05/13/2011   Split night study 2011:  AHI 21/hr with obstructive and central events.  Placed on cpap and titrated to 13cm with reasonable control, but attempts to increase pressure increased the number of central events.  Medium resmed quattro full face mask used.    . Peripheral neuropathy    peripheral neuropathy  . Phimosis/adherent prepuce 10/21/2013  . Pneumonia   . Prostate cancer ([place])   . Shortness of breath 11/05/2010   Qualifier: Diagnosis of  By: Marilynne Halsted, RN, BSN, Jacquelyn    . Sleep apnea    mild-no cpap, unable to tolerate  . Umbilical hernia   . Umbilical hernia without obstruction and without gangrene 05/19/2016  . URI (upper respiratory infection) 04/27/2017  . UTI (urinary tract infection) 12/01/2016  . Varicose veins   . Varicose veins of bilateral lower extremities with other complications [id number]  . Wheezing 01/01/2015    Surgical History: Past Surgical History:  Procedure Laterality Date  .  ANGIOPLASTY    . APPENDECTOMY    . BACK SURGERY     x3  . CARDIAC CATHETERIZATION     11'11  . CIRCUMCISION N/A 10/21/2013   Procedure: CIRCUMCISION ADULT;  Surgeon: Bernestine Amass, MD;  Location: [facility];  Service: Urology;  Laterality: N/A;  . CYSTOSCOPY N/A 10/21/2013   Procedure: CYSTOSCOPY FLEXIBLE;  Surgeon: Bernestine Amass, MD;  Location: [facility];  Service: Urology;  Laterality: N/A;  . INSERTION OF MESH N/A 06/17/2016   Procedure: INSERTION OF MESH;   Surgeon: Excell Seltzer, MD;  Location: [facility];  Service: General;  Laterality: N/A;  . UMBILICAL HERNIA REPAIR N/A 06/17/2016   Procedure: REPAIR UMBILICAL HERNIA WITH MESH;  Surgeon: Excell Seltzer, MD;  Location: [facility];  Service: General;  Laterality: N/A;  . WRIST SURGERY Right     Home Medications:  Allergies as of 12/04/2019      Reactions   Seroquel [quetiapine] Hives      Medication List       Accurate as of December 04, 2019 11:59 PM. If you have any questions, ask your nurse or doctor.        STOP taking these medications   DULoxetine 60 MG capsule Commonly known as: CYMBALTA Stopped by: Timber Marshman, PA-C   Symproic 0.2 MG Tabs Generic drug: Naldemedine Tosylate Stopped by: Kimora Stankovic, PA-C     TAKE these medications   Albuterol Sulfate 108 (90 Base) MCG/ACT Aepb Inhale 2 puffs into the lungs 4 (four) times daily as needed.   ALPRAZolam 0.5 MG tablet Commonly known as: XANAX Take 0.5 mg by mouth daily.   aspirin 81 MG tablet Take 81 mg by mouth daily.   cephALEXin 500 MG capsule Commonly known as: KEFLEX Take 1 capsule (500 mg total) by mouth 4 (four) times daily.   furosemide 40 MG tablet Commonly known as: LASIX Take 40 mg by mouth daily as needed.   linaclotide 72 MCG capsule Commonly known as: Linzess Take 1 capsule (72 mcg total) by mouth daily before breakfast.   potassium chloride 10 MEQ tablet Commonly known as: KLOR-CON TAKE 2 TABLETS BY MOUTH PER DAY ON DAYS TAKING FUROSEMIDE   simvastatin 80 MG tablet Commonly known as: ZOCOR TAKE 1 TABLET BY MOUTH EVERY EVENING   solifenacin 10 MG tablet Commonly known as: VESICARE TAKE 1 TABLET BY MOUTH DAILY   traZODone 50 MG tablet Commonly known as: DESYREL TAKE 1/2 TO 1 TABLET BY MOUTH AT BEDTIMEAS NEEDED FOR SLEEP       Allergies:  Allergies  Allergen Reactions  . Seroquel [Quetiapine] Hives    Family History: Family History  Problem Relation Age of Onset  .  Prostate cancer Father   . Emphysema Father   . Hyperlipidemia Father   . Hyperlipidemia Mother     Social History:  reports that he quit smoking about 23 years ago. His smoking use included cigarettes. He has a 20.00 pack-year smoking history. He has never used smokeless tobacco. He reports that he does not drink alcohol or use drugs.  ROS: UROLOGY Frequent Urination?: No Hard to postpone urination?: No Burning/pain with urination?: No Get up at night to urinate?: Yes Leakage of urine?: No Urine stream starts and stops?: No Trouble starting stream?: No Do you have to strain to urinate?: No Blood in urine?: No Urinary tract infection?: No Sexually transmitted disease?: No Injury to kidneys or bladder?: No Painful intercourse?: No Weak stream?: No Erection problems?: No Penile pain?: No  Gastrointestinal Nausea?: No Vomiting?: No Indigestion/heartburn?: No Diarrhea?: No Constipation?: No  Constitutional Fever: No Night sweats?: No Weight loss?: Yes Fatigue?: No  Skin Skin rash/lesions?: No Itching?: No  Eyes Blurred vision?: Yes Double vision?: No  Ears/Nose/Throat Sore throat?: No Sinus problems?: No  Hematologic/Lymphatic Swollen glands?: No Easy bruising?: No  Cardiovascular Leg swelling?: No Chest pain?: No  Respiratory Cough?: No Shortness of breath?: No  Endocrine Excessive thirst?: No  Musculoskeletal Back pain?: Yes Joint pain?: No  Neurological Headaches?: No Dizziness?: No  Psychologic Depression?: Yes Anxiety?: No  Physical Exam: BP (!) 203/84   Pulse 74   Ht 5' 10.5" (1.791 m)   Wt 194 lb 11.2 oz (88.3 kg)   BMI 27.54 kg/m   Constitutional:  Well nourished. Alert and oriented, No acute distress. HEENT: [place] AT, moist mucus membranes.  Trachea midline, no masses. Cardiovascular: No clubbing, cyanosis, or edema. Respiratory: Normal respiratory effort, no increased work of breathing. GI: Abdomen is soft, non tender, non  distended, no abdominal masses. Liver and spleen not palpable.  No hernias appreciated.  Stool sample for occult testing is not indicated.   GU: No CVA tenderness.  No bladder fullness or masses.   Neurologic: Grossly intact, no focal deficits, moving all 4 extremities. Psychiatric: Normal mood and affect.  Laboratory Data: Lab Results  Component Value Date   WBC 6.4 12/04/2019   HGB 12.0 (L) 12/04/2019   HCT 34.6 (L) 12/04/2019   MCV 90 12/04/2019   PLT 199 12/04/2019    Lab Results  Component Value Date   CREATININE 0.89 12/04/2019    Lab Results  Component Value Date   PSA 0.27 06/18/2019   PSA 10.73 (H) 05/19/2016    No results found for: TESTOSTERONE  Lab Results  Component Value Date   HGBA1C 6.2 10/08/2019    Lab Results  Component Value Date   TSH 1.90 06/18/2019       Component Value Date/Time   CHOL 114 06/18/2019 1020   HDL 41.70 06/18/2019 1020   CHOLHDL 3 06/18/2019 1020   VLDL 20.8 06/18/2019 1020   LDLCALC 51 06/18/2019 1020    Lab Results  Component Value Date   AST 17 10/08/2019   Lab Results  Component Value Date   ALT 13 10/08/2019   No components found for: ALKALINEPHOPHATASE No components found for: BILIRUBINTOTAL  No results found for: ESTRADIOL  Urinalysis Component     Latest Ref Rng & Units 12/04/2019  Specific Gravity, UA     1.005 - 1.030 1.010  pH, UA     5.0 - 7.5 5.5  Color, UA     Yellow Yellow  Appearance Ur     Clear Clear  Leukocytes,UA     Negative Negative  Protein,UA     Negative/Trace Negative  Glucose, UA     Negative Negative  Ketones, UA     Negative Negative  RBC, UA     Negative Negative  Bilirubin, UA     Negative Negative  Urobilinogen, Ur     0.2 - 1.0 mg/dL 0.2  Nitrite, UA     Negative Negative  Microscopic Examination      See below:   Component     Latest Ref Rng & Units 12/04/2019         1:48 PM  WBC, UA     0 - 5 /hpf 0-5  RBC     4.14 - 5.80 x10E6/uL None seen   Epithelial Cells (non  renal)     0 - 10 /hpf 0-10  Bacteria, UA     None seen/Few None seen    I have reviewed the labs.   Pertinent Imaging: CLINICAL DATA:  Right-sided flank pain  EXAM: ABDOMEN - 1 VIEW  COMPARISON:  The  FINDINGS: The bowel gas pattern is normal. No radio-opaque calculi or other significant radiographic abnormality are seen. Previous interbody fusion at L4-5 and L5-S1.  IMPRESSION: No urinary tract calculi identified.   Electronically Signed   By: Kerby Moors M.D.   On: 12/04/2019 18:06 I have independently reviewed the films and do not see a stone.    Assessment & Plan:    1. Right ureteral stones  - schedule right ureteroscopy with laser lithotripsy and ureteral stent placement  - explained to the patient how the procedure is performed and the risks involved  - informed patient that they will have a stent placed during the procedure and will remain in place after the procedure for a short time.   - stent may be removed in the office with a cystoscope or patient may be instructed to remove the stent themselves by the string  - described "stent pain" as feelings of needing to urinate/overactive bladder and a warm, tingling sensation to intense pain in the affected flank  - residual stones within the kidney or ureter may be present after the procedure and may need to have these addressed at a different encounter  - injury to the ureter is the most common intra-operative risk, it may result in an open procedure to correct the defect  - infection and bleeding are also risks  - explained the risks of general anesthesia, such as: MI, CVA, paralysis, coma and/or death  - UA  - Urine culture  - BMP  - CBC  - advised to contact our office or seek treatment in the ED if becomes febrile or pain/ vomiting are difficult control in order to arrange for emergent/urgent intervention  2. Right hydronephrosis  - obtain RUS to ensure the hydronephrosis  has resolved once they have passed and/or recovered from procedure to ensure to iatrogenic hydronephrosis remains  3. High risk prostate cancer Has appointments in May 2021 with Dr. Erlene Quan  4. OAB/pelvic floor dysfunction Continue Vesicare   Return for right URS/LL/ureteral stent placement .  These notes generated with voice recognition software. I apologize for typographical errors.  Zara Council, PA-C  [facility] [address]  [place], [place] [address] [phone]

## 2019-12-04 ENCOUNTER — Other Ambulatory Visit: Payer: Self-pay

## 2019-12-04 ENCOUNTER — Ambulatory Visit (INDEPENDENT_AMBULATORY_CARE_PROVIDER_SITE_OTHER): Payer: Medicare Other | Admitting: Urology

## 2019-12-04 ENCOUNTER — Ambulatory Visit
Admission: RE | Admit: 2019-12-04 | Discharge: 2019-12-04 | Disposition: A | Discharge status: Home / Self Care | Payer: Medicare Other | Attending: Urology | Admitting: Urology

## 2019-12-04 ENCOUNTER — Encounter: Payer: Self-pay | Admitting: Urology

## 2019-12-04 ENCOUNTER — Ambulatory Visit: Payer: Medicare Other | Admitting: Internal Medicine

## 2019-12-04 ENCOUNTER — Ambulatory Visit
Admission: RE | Admit: 2019-12-04 | Discharge: 2019-12-04 | Disposition: A | Discharge status: Home / Self Care | Payer: Medicare Other | Source: Ambulatory Visit | Attending: Urology | Admitting: Urology

## 2019-12-04 ENCOUNTER — Other Ambulatory Visit: Payer: Self-pay | Admitting: Radiology

## 2019-12-04 VITALS — BP 203/84 | HR 74 | Ht 70.5 in | Wt 194.7 lb

## 2019-12-04 DIAGNOSIS — N201 Calculus of ureter: Secondary | ICD-10-CM | POA: Insufficient documentation

## 2019-12-04 DIAGNOSIS — N132 Hydronephrosis with renal and ureteral calculous obstruction: Secondary | ICD-10-CM | POA: Diagnosis not present

## 2019-12-04 DIAGNOSIS — R109 Unspecified abdominal pain: Secondary | ICD-10-CM | POA: Diagnosis not present

## 2019-12-05 ENCOUNTER — Encounter: Payer: Self-pay | Admitting: Radiology

## 2019-12-05 LAB — CBC WITH DIFFERENTIAL/PLATELET
Basophils Absolute: 0 10*3/uL (ref 0.0–0.2)
Basos: 1 %
EOS (ABSOLUTE): 0.2 10*3/uL (ref 0.0–0.4)
Eos: 3 %
Hematocrit: 34.6 % — ABNORMAL LOW (ref 37.5–51.0)
Hemoglobin: 12 g/dL — ABNORMAL LOW (ref 13.0–17.7)
Immature Grans (Abs): 0 10*3/uL (ref 0.0–0.1)
Immature Granulocytes: 0 %
Lymphocytes Absolute: 1.4 10*3/uL (ref 0.7–3.1)
Lymphs: 22 %
MCH: 31.1 pg (ref 26.6–33.0)
MCHC: 34.7 g/dL (ref 31.5–35.7)
MCV: 90 fL (ref 79–97)
Monocytes Absolute: 0.7 10*3/uL (ref 0.1–0.9)
Monocytes: 11 %
Neutrophils Absolute: 4.1 10*3/uL (ref 1.4–7.0)
Neutrophils: 63 %
Platelets: 199 10*3/uL (ref 150–450)
RBC: 3.86 x10E6/uL — ABNORMAL LOW (ref 4.14–5.80)
RDW: 13.1 % (ref 11.6–15.4)
WBC: 6.4 10*3/uL (ref 3.4–10.8)

## 2019-12-05 LAB — MICROSCOPIC EXAMINATION
Bacteria, UA: NONE SEEN
RBC, Urine: NONE SEEN /HPF (ref 0–2)

## 2019-12-05 LAB — URINALYSIS, COMPLETE
Bilirubin, UA: NEGATIVE
Glucose, UA: NEGATIVE
Ketones, UA: NEGATIVE
Leukocytes,UA: NEGATIVE
Nitrite, UA: NEGATIVE
Protein,UA: NEGATIVE
RBC, UA: NEGATIVE
Specific Gravity, UA: 1.01 (ref 1.005–1.030)
Urobilinogen, Ur: 0.2 mg/dL (ref 0.2–1.0)
pH, UA: 5.5 (ref 5.0–7.5)

## 2019-12-05 LAB — BASIC METABOLIC PANEL WITH GFR
BUN/Creatinine Ratio: 16 (ref 10–24)
BUN: 14 mg/dL (ref 8–27)
CO2: 29 mmol/L (ref 20–29)
Calcium: 9.3 mg/dL (ref 8.6–10.2)
Chloride: 101 mmol/L (ref 96–106)
Creatinine, Ser: 0.89 mg/dL (ref 0.76–1.27)
GFR calc Af Amer: 98 mL/min/{1.73_m2}
GFR calc non Af Amer: 85 mL/min/{1.73_m2}
Glucose: 106 mg/dL — ABNORMAL HIGH (ref 65–99)
Potassium: 3.8 mmol/L (ref 3.5–5.2)
Sodium: 142 mmol/L (ref 134–144)

## 2019-12-07 LAB — CULTURE, URINE COMPREHENSIVE

## 2019-12-23 ENCOUNTER — Encounter
Admission: RE | Admit: 2019-12-23 | Discharge: 2019-12-23 | Disposition: A | Discharge status: Home / Self Care | Payer: Medicare Other | Source: Ambulatory Visit | Attending: Urology | Admitting: Urology

## 2019-12-23 ENCOUNTER — Other Ambulatory Visit: Payer: Self-pay

## 2019-12-23 HISTORY — DX: Personal history of urinary calculi: Z87.442

## 2019-12-23 NOTE — Patient Instructions (Addendum)
Your procedure is scheduled on: 01/06/20 Report to [facility]. To find out your arrival time please call [phone] between 1PM - 3PM on 01/03/20 .  Remember: Instructions that are not followed completely may result in serious medical risk, up to and including death, or upon the discretion of your surgeon and anesthesiologist your surgery may need to be rescheduled.     _X__ 1. Do not eat food after midnight the night before your procedure.                 No gum chewing or hard candies. You may drink clear liquids up to 2 hours                 before you are scheduled to arrive for your surgery- DO not drink clear                 liquids within 2 hours of the start of your surgery.                 Clear Liquids include:  water, apple juice without pulp, clear carbohydrate                 drink such as Clearfast or Gatorade, Black Coffee or Tea (Do not add                 anything to coffee or tea). Diabetics water only  __X__2.  On the morning of surgery brush your teeth with toothpaste and water, you                 may rinse your mouth with mouthwash if you wish.  Do not swallow any              toothpaste of mouthwash.     _X__ 3.  No Alcohol for 24 hours before or after surgery.   _X__ 4.  Do Not Smoke or use e-cigarettes For 24 Hours Prior to Your Surgery.                 Do not use any chewable tobacco products for at least 6 hours prior to                 surgery.  ____  5.  Bring all medications with you on the day of surgery if instructed.   __X__  6.  Notify your doctor if there is any change in your medical condition      (cold, fever, infections).     Do not wear jewelry, make-up, hairpins, clips or nail polish. Do not wear lotions, powders, or perfumes.  Do not shave 48 hours prior to surgery. Men may shave face and neck. Do not bring valuables to the hospital.    [facility] is not responsible for any belongings or  valuables.  Contacts, dentures/partials or body piercings may not be worn into surgery. Bring a case for your contacts, glasses or hearing aids, a denture cup will be supplied. Leave your suitcase in the car. After surgery it may be brought to your room. For patients admitted to the hospital, discharge time is determined by your treatment team.   Patients discharged the day of surgery will not be allowed to drive home.   Please read over the following fact sheets that you were given:   MRSA Information  __X__ Take these medicines the morning of surgery with A SIP OF WATER:  1. vesicare  2. oxycodone  3.   4.  5.  6.  ____ Fleet Enema (as directed)   ____ Use CHG Soap/SAGE wipes as directed  __X__ Use inhalers on the day of surgery  ____ Stop metformin/Janumet/Farxiga 2 days prior to surgery    ____ Take 1/2 of usual insulin dose the night before surgery. No insulin the morning          of surgery.   ____ Stop Blood Thinners Coumadin/Plavix/Xarelto/Pleta/Pradaxa/Eliquis/Effient/Aspirin  on   Or contact your Surgeon, Cardiologist or Medical Doctor regarding  ability to stop your blood thinners  __X__ Stop Anti-inflammatories 7 days before surgery such as Advil, Ibuprofen, Motrin,  BC or Goodies Powder, Naprosyn, Naproxen, Aleve, Aspirin (instructed to stop asa 12/30/19 by Dr [name])   __X__ Stop all herbal supplements, fish oil or vitamin E until after surgery.    ____ Bring C-Pap to the hospital.

## 2019-12-25 ENCOUNTER — Encounter: Payer: Self-pay | Admitting: Internal Medicine

## 2019-12-26 ENCOUNTER — Other Ambulatory Visit: Payer: Self-pay

## 2019-12-26 ENCOUNTER — Other Ambulatory Visit: Payer: Medicare Other

## 2019-12-26 DIAGNOSIS — N201 Calculus of ureter: Secondary | ICD-10-CM | POA: Diagnosis not present

## 2019-12-26 LAB — URINALYSIS, COMPLETE
Bilirubin, UA: NEGATIVE
Glucose, UA: NEGATIVE
Ketones, UA: NEGATIVE
Leukocytes,UA: NEGATIVE
Nitrite, UA: NEGATIVE
Protein,UA: NEGATIVE
RBC, UA: NEGATIVE
Specific Gravity, UA: 1.02 (ref 1.005–1.030)
Urobilinogen, Ur: 0.2 mg/dL (ref 0.2–1.0)
pH, UA: 5.5 (ref 5.0–7.5)

## 2019-12-26 LAB — MICROSCOPIC EXAMINATION
Bacteria, UA: NONE SEEN
RBC, Urine: NONE SEEN /HPF (ref 0–2)

## 2019-12-29 LAB — CULTURE, URINE COMPREHENSIVE

## 2020-01-01 ENCOUNTER — Ambulatory Visit (INDEPENDENT_AMBULATORY_CARE_PROVIDER_SITE_OTHER): Payer: Medicare Other | Admitting: Podiatry

## 2020-01-01 ENCOUNTER — Other Ambulatory Visit: Payer: Self-pay

## 2020-01-01 ENCOUNTER — Ambulatory Visit (INDEPENDENT_AMBULATORY_CARE_PROVIDER_SITE_OTHER): Payer: Medicare Other

## 2020-01-01 DIAGNOSIS — Z9889 Other specified postprocedural states: Secondary | ICD-10-CM | POA: Diagnosis not present

## 2020-01-01 DIAGNOSIS — L600 Ingrowing nail: Secondary | ICD-10-CM | POA: Diagnosis not present

## 2020-01-01 DIAGNOSIS — M2011 Hallux valgus (acquired), right foot: Secondary | ICD-10-CM

## 2020-01-01 DIAGNOSIS — M2041 Other hammer toe(s) (acquired), right foot: Secondary | ICD-10-CM

## 2020-01-01 NOTE — Progress Notes (Signed)
He presents today for a follow-up of his bunionectomy and his second met osteotomy with hammertoe repair #2 #3 and 4 of the right foot.  States that he has been doing very well he is very happy with the outcome but states that he has no pain.  Objective: Presents today with his Darco shoe.  Slightly ingrown nail fibular border hallux right minimal erythema no cellulitis drainage or odor has great range of motion of the first metatarsophalangeal joint second metatarsophalangeal joint and toes are minimally swollen.  Radiographs taken today demonstrate osteotomy first and second healing well.  Hammertoes with screws healing well.  Plan: Well-healing surgical foot I debrided the sharp edge of the nail today recommended he get back into tennis shoes and I will follow-up with him in about 3 weeks.

## 2020-01-02 ENCOUNTER — Other Ambulatory Visit
Admission: RE | Admit: 2020-01-02 | Discharge: 2020-01-02 | Disposition: A | Discharge status: Home / Self Care | Payer: Medicare Other | Source: Ambulatory Visit | Attending: Urology | Admitting: Urology

## 2020-01-02 DIAGNOSIS — Z20822 Contact with and (suspected) exposure to covid-19: Secondary | ICD-10-CM | POA: Diagnosis not present

## 2020-01-02 DIAGNOSIS — Z01812 Encounter for preprocedural laboratory examination: Secondary | ICD-10-CM | POA: Insufficient documentation

## 2020-01-03 LAB — SARS CORONAVIRUS 2 (TAT 6-24 HRS): SARS Coronavirus 2: NEGATIVE

## 2020-01-06 ENCOUNTER — Other Ambulatory Visit: Payer: Self-pay

## 2020-01-06 ENCOUNTER — Encounter: Payer: Self-pay | Admitting: Urology

## 2020-01-06 ENCOUNTER — Encounter
Admission: RE | Disposition: A | Discharge status: Home / Self Care | Payer: Self-pay | Source: Home / Self Care | Attending: Urology

## 2020-01-06 ENCOUNTER — Ambulatory Visit: Payer: Medicare Other | Admitting: Anesthesiology

## 2020-01-06 ENCOUNTER — Ambulatory Visit
Admission: RE | Admit: 2020-01-06 | Discharge: 2020-01-06 | Disposition: A | Discharge status: Home / Self Care | Payer: Medicare Other | Attending: Urology | Admitting: Urology

## 2020-01-06 ENCOUNTER — Ambulatory Visit: Payer: Medicare Other

## 2020-01-06 DIAGNOSIS — G4700 Insomnia, unspecified: Secondary | ICD-10-CM | POA: Diagnosis not present

## 2020-01-06 DIAGNOSIS — F329 Major depressive disorder, single episode, unspecified: Secondary | ICD-10-CM | POA: Insufficient documentation

## 2020-01-06 DIAGNOSIS — Z87891 Personal history of nicotine dependence: Secondary | ICD-10-CM | POA: Diagnosis not present

## 2020-01-06 DIAGNOSIS — N2889 Other specified disorders of kidney and ureter: Secondary | ICD-10-CM | POA: Diagnosis not present

## 2020-01-06 DIAGNOSIS — N132 Hydronephrosis with renal and ureteral calculous obstruction: Secondary | ICD-10-CM | POA: Diagnosis present

## 2020-01-06 DIAGNOSIS — I252 Old myocardial infarction: Secondary | ICD-10-CM | POA: Insufficient documentation

## 2020-01-06 DIAGNOSIS — Z79899 Other long term (current) drug therapy: Secondary | ICD-10-CM | POA: Diagnosis not present

## 2020-01-06 DIAGNOSIS — E7800 Pure hypercholesterolemia, unspecified: Secondary | ICD-10-CM | POA: Diagnosis not present

## 2020-01-06 DIAGNOSIS — E785 Hyperlipidemia, unspecified: Secondary | ICD-10-CM | POA: Insufficient documentation

## 2020-01-06 DIAGNOSIS — I2510 Atherosclerotic heart disease of native coronary artery without angina pectoris: Secondary | ICD-10-CM | POA: Diagnosis not present

## 2020-01-06 DIAGNOSIS — N3281 Overactive bladder: Secondary | ICD-10-CM | POA: Insufficient documentation

## 2020-01-06 DIAGNOSIS — F419 Anxiety disorder, unspecified: Secondary | ICD-10-CM | POA: Insufficient documentation

## 2020-01-06 DIAGNOSIS — N135 Crossing vessel and stricture of ureter without hydronephrosis: Secondary | ICD-10-CM | POA: Diagnosis not present

## 2020-01-06 DIAGNOSIS — N201 Calculus of ureter: Secondary | ICD-10-CM

## 2020-01-06 DIAGNOSIS — Z7982 Long term (current) use of aspirin: Secondary | ICD-10-CM | POA: Diagnosis not present

## 2020-01-06 DIAGNOSIS — C61 Malignant neoplasm of prostate: Secondary | ICD-10-CM | POA: Diagnosis not present

## 2020-01-06 DIAGNOSIS — G4733 Obstructive sleep apnea (adult) (pediatric): Secondary | ICD-10-CM | POA: Diagnosis not present

## 2020-01-06 HISTORY — PX: CYSTOSCOPY/URETEROSCOPY/HOLMIUM LASER/STENT PLACEMENT: SHX6546

## 2020-01-06 HISTORY — PX: CYSTOSCOPY WITH BIOPSY: SHX5122

## 2020-01-06 SURGERY — CYSTOSCOPY/URETEROSCOPY/HOLMIUM LASER/STENT PLACEMENT
Anesthesia: General | Site: Ureter | Laterality: Right

## 2020-01-06 MED ORDER — ONDANSETRON HCL 4 MG/2ML IJ SOLN
INTRAMUSCULAR | Status: AC
  Filled 2020-01-06: qty 2

## 2020-01-06 MED ORDER — PROPOFOL 10 MG/ML IV BOLUS
INTRAVENOUS | Status: AC
  Filled 2020-01-06: qty 40

## 2020-01-06 MED ORDER — FAMOTIDINE 20 MG PO TABS: 20.0000 mg | ORAL_TABLET | Freq: Once | ORAL | Status: AC

## 2020-01-06 MED ORDER — ONDANSETRON HCL 4 MG/2ML IJ SOLN: 4.0000 mg | Freq: Once | INTRAMUSCULAR | Status: DC | PRN

## 2020-01-06 MED ORDER — ONDANSETRON HCL 4 MG/2ML IJ SOLN
INTRAMUSCULAR | Status: DC | PRN
  Administered 2020-01-06: 4 mg via INTRAVENOUS

## 2020-01-06 MED ORDER — FENTANYL CITRATE (PF) 100 MCG/2ML IJ SOLN: 25.0000 ug | INTRAMUSCULAR | Status: DC | PRN

## 2020-01-06 MED ORDER — OXYCODONE HCL 5 MG PO TABS: 5.0000 mg | ORAL_TABLET | Freq: Once | ORAL | Status: DC | PRN

## 2020-01-06 MED ORDER — IOHEXOL 180 MG/ML  SOLN
INTRAMUSCULAR | Status: DC | PRN
  Administered 2020-01-06: 20 mL

## 2020-01-06 MED ORDER — LIDOCAINE HCL (CARDIAC) PF 100 MG/5ML IV SOSY
PREFILLED_SYRINGE | INTRAVENOUS | Status: DC | PRN
  Administered 2020-01-06: 80 mg via INTRAVENOUS

## 2020-01-06 MED ORDER — CEFAZOLIN SODIUM-DEXTROSE 1-4 GM/50ML-% IV SOLN
INTRAVENOUS | Status: AC
  Filled 2020-01-06: qty 50

## 2020-01-06 MED ORDER — FAMOTIDINE 20 MG PO TABS
ORAL_TABLET | ORAL | Status: AC
  Administered 2020-01-06: 20 mg via ORAL
  Filled 2020-01-06: qty 1

## 2020-01-06 MED ORDER — LIDOCAINE HCL (PF) 2 % IJ SOLN
INTRAMUSCULAR | Status: AC
  Filled 2020-01-06: qty 5

## 2020-01-06 MED ORDER — OXYCODONE-ACETAMINOPHEN 10-325 MG PO TABS: 1.0000 | ORAL_TABLET | ORAL | 0 refills | Status: DC | PRN

## 2020-01-06 MED ORDER — CEFAZOLIN SODIUM-DEXTROSE 1-4 GM/50ML-% IV SOLN
1.0000 g | INTRAVENOUS | Status: AC
  Administered 2020-01-06: 11:00:00 1 g via INTRAVENOUS

## 2020-01-06 MED ORDER — ACETAMINOPHEN 10 MG/ML IV SOLN: 1000.0000 mg | Freq: Once | INTRAVENOUS | Status: DC | PRN

## 2020-01-06 MED ORDER — FENTANYL CITRATE (PF) 100 MCG/2ML IJ SOLN
INTRAMUSCULAR | Status: DC | PRN
  Administered 2020-01-06: 25 ug via INTRAVENOUS

## 2020-01-06 MED ORDER — PROPOFOL 10 MG/ML IV BOLUS
INTRAVENOUS | Status: DC | PRN
  Administered 2020-01-06: 50 mg via INTRAVENOUS
  Administered 2020-01-06: 150 mg via INTRAVENOUS

## 2020-01-06 MED ORDER — TAMSULOSIN HCL 0.4 MG PO CAPS: 0.4000 mg | ORAL_CAPSULE | Freq: Every day | ORAL | 0 refills | Status: DC

## 2020-01-06 MED ORDER — FENTANYL CITRATE (PF) 100 MCG/2ML IJ SOLN
INTRAMUSCULAR | Status: AC
  Filled 2020-01-06: qty 2

## 2020-01-06 MED ORDER — OXYCODONE HCL 5 MG/5ML PO SOLN: 5.0000 mg | Freq: Once | ORAL | Status: DC | PRN

## 2020-01-06 MED ORDER — LACTATED RINGERS IV SOLN: INTRAVENOUS | Status: DC

## 2020-01-06 SURGICAL SUPPLY — 28 items
BAG DRAIN CYSTO-URO LG1000N (MISCELLANEOUS) ×2
BASKET ZERO TIP 1.9FR (BASKET) ×2
BRUSH SCRUB EZ 1% IODOPHOR (MISCELLANEOUS) ×2
CATH URETL 5X70 OPEN END (CATHETERS) ×2
CNTNR SPEC 2.5X3XGRAD LEK (MISCELLANEOUS) ×1
CONT SPEC 4OZ STER OR WHT (MISCELLANEOUS) ×1
DRAPE UTILITY 15X26 TOWEL STRL (DRAPES) ×2
FIBER LASER TRAC TIP (UROLOGICAL SUPPLIES) ×2
FORCEPS BIOP PIRANHA Y (CUTTING FORCEPS) ×2
GLOVE BIO SURGEON STRL SZ 6.5 (GLOVE) ×2
GOWN STRL REUS W/ TWL LRG LVL3 (GOWN DISPOSABLE) ×2
GOWN STRL REUS W/TWL LRG LVL3 (GOWN DISPOSABLE) ×2
GUIDEWIRE GREEN .038 145CM (MISCELLANEOUS)
GUIDEWIRE STR DUAL SENSOR (WIRE) ×4
INFUSOR MANOMETER BAG 3000ML (MISCELLANEOUS) ×2
INTRODUCER DILATOR DOUBLE (INTRODUCER)
KIT TURNOVER CYSTO (KITS) ×2
PACK CYSTO AR (MISCELLANEOUS) ×2
PAD TELFA 2X3 NADH STRL (GAUZE/BANDAGES/DRESSINGS) ×2
SET CYSTO W/LG BORE CLAMP LF (SET/KITS/TRAYS/PACK) ×2
SHEATH URETERAL 12FRX35CM (MISCELLANEOUS)
SOL .9 NS 3000ML IRR  AL (IV SOLUTION) ×1
SOL .9 NS 3000ML IRR UROMATIC (IV SOLUTION) ×1
STENT URET 6FRX24 CONTOUR (STENTS)
STENT URET 6FRX26 CONTOUR (STENTS)
STENT URO INLAY 6FRX26CM (STENTS) ×2
SURGILUBE 2OZ TUBE FLIPTOP (MISCELLANEOUS) ×2
WATER STERILE IRR 1000ML POUR (IV SOLUTION) ×2

## 2020-01-06 NOTE — Anesthesia Preprocedure Evaluation (Signed)
Anesthesia Evaluation  Patient identified by [name], date of birth, ID band Patient awake    Reviewed: Allergy & Precautions, NPO status , Patient's Chart, lab work & pertinent test results  History of Anesthesia Complications Negative for: history of anesthetic complications  Airway Mallampati: II  TM Distance: >3 FB Neck ROM: Full    Dental no notable dental hx. (+) Teeth Intact, Upper Dentures, Lower Dentures Dentures cemented in place, unable to be removed:   Pulmonary neg pulmonary ROS, neg sleep apnea, neg COPD, Patient abstained from smoking.Not current smoker, former smoker,    Pulmonary exam normal breath sounds clear to auscultation       Cardiovascular Exercise Tolerance: Good METS(-) hypertension+ CAD and + Past MI  (-) Cardiac Stents (-) dysrhythmias  Rhythm:Regular Rate:Normal - Systolic murmurs MI in the 90s s/p angioplasty, no stents 2017: MPI stress normal, no ischemia, normal EF   Neuro/Psych Anxiety Depression S/p multiple lower back surgeries  Neuromuscular disease negative psych ROS   GI/Hepatic neg GERD  ,(+)     (-) substance abuse  ,   Endo/Other  neg diabetes  Renal/GU negative Renal ROS     Musculoskeletal   Abdominal   Peds  Hematology   Anesthesia Other Findings Past Medical History: 01/01/2015: Acute bronchitis 05/19/2016: Acute upper respiratory infection No date: Anxiety No date: Arthritis     Comment:  back,wrists,hx. previous fractures No date: BPH (benign prostatic hypertrophy) 04/29/2008: BRONCHITIS, CHRONIC     Comment:  Qualifier: Diagnosis of  By: Halford Chessman MD, Vineet   10/16/2009: CAD     Comment:  Qualifier: Diagnosis of  By: Lynden Ang   No date: CAD (coronary artery disease)     Comment:  angioplasty  No date: Chronic back pain No date: Chronic bronchitis 10/14/2016: Chronic low back pain with sciatica 04/27/2017: Confusion No date: Depression 12/20/2013: Encounter  for preventative adult health care exam with  abnormal findings 04/27/2017: Fever No date: GERD (gastroesophageal reflux disease) 05/12/2015: Gynecomastia No date: H/O hiatal hernia     Comment:  pts wife states pt does not have  No date: Heart murmur No date: History of kidney stones No date: Hypercholesteremia 05/19/2016: Hyperglycemia 04/29/2008: HYPERLIPIDEMIA     Comment:  Qualifier: Diagnosis of  By: Jim Like   12/20/2013: Impaired glucose tolerance 04/27/2017: Insomnia 10/14/2016: Lumbosacral radiculopathy No date: MI (myocardial infarction) ([place])     Comment:  mid-'90's 04/29/2008: MYOCARDIAL INFARCTION     Comment:  Qualifier: History of  By: Jim Like   05/13/2011: OSA (obstructive sleep apnea)     Comment:  Split night study 2011:  AHI 21/hr with obstructive and               central events.  Placed on cpap and titrated to 13cm with              reasonable control, but attempts to increase pressure               increased the number of central events.  Medium resmed               quattro full face mask used.   No date: Peripheral neuropathy     Comment:  peripheral neuropathy 10/21/2013: Phimosis/adherent prepuce No date: Pneumonia No date: Prostate cancer ([place]) 11/05/2010: Shortness of breath     Comment:  Qualifier: Diagnosis of  By: Marilynne Halsted, RN, BSN, Jacquelyn   No date: Sleep apnea     Comment:  mild-no cpap, unable to tolerate  No date: Umbilical hernia [id number]: Umbilical hernia without obstruction and without gangrene 04/27/2017: URI (upper respiratory infection) 12/01/2016: UTI (urinary tract infection) No date: Varicose veins 10/14/2016: Varicose veins of bilateral lower extremities with other  complications [id number]: Wheezing  Reproductive/Obstetrics                             Anesthesia Physical Anesthesia Plan  ASA: II  Anesthesia Plan: General   Post-op Pain Management:    Induction: Intravenous  PONV Risk  Score and Plan: 3 and Ondansetron and Dexamethasone  Airway Management Planned: Oral ETT and LMA  Additional Equipment: None  Intra-op Plan:   Post-operative Plan: Extubation in OR  Informed Consent: I have reviewed the patients History and Physical, chart, labs and discussed the procedure including the risks, benefits and alternatives for the proposed anesthesia with the patient or authorized representative who has indicated his/her understanding and acceptance.     Dental advisory given  Plan Discussed with: CRNA and Surgeon  Anesthesia Plan Comments: (Discussed risks of anesthesia with patient, including PONV, sore throat, lip/dental damage. Rare risks discussed as well, such as cardiorespiratory sequelae. Patient understands.)        Anesthesia Quick Evaluation

## 2020-01-06 NOTE — Op Note (Signed)
Date of procedure: 01/06/20  Preoperative diagnosis:  1. Right hydronephrosis 2. Right distal ureteral calculus  Postoperative diagnosis:  1. Same as above 2. Right distal ureteral stricture  Procedure: 1. Right ureteroscopy 2. Laser lithotripsy 3. Basket extraction of stone fragment 4. Right distal ureteral biopsy 5. Right ureteral stent placement 6. Right retrograde pyelogram  Surgeon: Hollice Espy, MD  Anesthesia: General  Complications: None  Intraoperative findings: Significant stricture of right distal ureter, several centimeters in length down to the level of the bladder with severe hydroureteronephrosis and profound transition point near the level of the stone.  Most proximal aspect of the strictured area with fullness, almost infiltrative appearance were the more distal aspect of the stricture consistent with radiation type appearance.  Stone treated, [employer] stent placed.  EBL: Minimal  Specimens: Stone fragment, right distal ureteral biopsy  Drains: 6 x 26 French right Bard Optima ureteral stent  Indication: Albert Wade is a 74 y.o. patient with small right distal ureteral calculi with proximal hydroureteronephrosis who failed to pass the spontaneously.  After reviewing the management options for treatment, he elected to proceed with the above surgical procedure(s). We have discussed the potential benefits and risks of the procedure, side effects of the proposed treatment, the likelihood of the patient achieving the goals of the procedure, and any potential problems that might occur during the procedure or recuperation. Informed consent has been obtained.  Description of procedure:  The patient was taken to the operating room and general anesthesia was induced.  The patient was placed in the dorsal lithotomy position, prepped and draped in the usual sterile fashion, and preoperative antibiotics were administered. A preoperative time-out was performed.   A  21 French cystoscope was advanced per urethra into the bladder.  Attention was turned to the right ureteral orifice which was cannulated Easily.  Retrograde pyelogram was performed which showed a high-grade right distal ureteral obstruction, several centimeters in length with a relatively profound transition point with severe hydroureteronephrosis tortuous proximal ureter.  There is no obvious filling defects.  Despite the high-grade obstruction, wire was able to be placed up to the level of the kidney relatively easily.  This is at least a safety wire.  I then was able to advance the 4.5 French semirigid ureteroscope through the strictured area of the ureter using a railroad technique over a second wire.  Within the very distal aspect of the ureter, the ureter itself appeared almost atretic, hypovascular with a scarlike appearance concerning for possible radiation stricture.  Somewhat different in the more proximal aspect of the stricture, the ureter appeared more full and easy almost infiltrative.  There was no obvious papillary change.  The defect was circumferential.  Just beyond the transition, a small 4 mm stone was encountered.  A 200 m laser fiber was then brought in and using settings of 0.2 J and 40 Hz, the stone was able to be dusted into tiny particles.  A few of the smaller pieces which were not really just were able to be basket extracted using 1.9 [place] tipless nitinol basket.  Today concern for possible infiltrative or malignant stricture, I did use Piranha forceps to biopsy the more proximal aspect of the stricture in several areas.  These are relatively good bites that were visible macroscopically.  These are possibilities right distal ureteral stricture biopsy.  Finally, the safety wire was backloaded over a rigid cystoscope.  A 6 x 26 [place] ART Optima ureteral stent was advanced over the wire  up to the level of the proximal normal ureter and ultimately renal pelvis.  The wire was then  partially withdrawn simple placement within the renal pelvis.  Wire was then fully withdrawn fulfills the bladder.  Bladder was drained.  Patient was then cleaned and dried, repositioned in supine position, versus anesthesia, and taken to the PACU in stable condition.  Plan: Findings were discussed today with the patient's wife.  We discussed the findings most likely represents a radiation structure, however biopsies are pending to rule out other underlying malignancy is. We will discuss management of this high-grade your router obstruction/Long structure that is folk appointment. This may include chronic indwelling stent versus ureteral re-implant. Given the length of the degree of the structure, percutaneous intervention with balloon does not seem likely to be successful.   Hollice Espy, M.D.

## 2020-01-06 NOTE — Transfer of Care (Signed)
Immediate Anesthesia Transfer of Care Note  Patient: Albert Wade  Procedure(s) Performed: CYSTOSCOPY/URETEROSCOPY/HOLMIUM LASER/STENT PLACEMENT (Right Ureter) CYSTOSCOPY WITH BIOPSY (Right Ureter)  Patient Location: PACU  Anesthesia Type:General  Level of Consciousness: awake, alert  and oriented  Airway & Oxygen Therapy: Patient connected to face mask oxygen  Post-op Assessment: Post -op Vital signs reviewed and stable  Post vital signs: stable  Last Vitals:  Vitals Value Taken Time  BP 144/74 01/06/20 1132  Temp    Pulse 79 01/06/20 1136  Resp 16 01/06/20 1136  SpO2 99 % 01/06/20 1136  Vitals shown include unvalidated device data.  Last Pain:  Vitals:   01/06/20 0822  TempSrc: Temporal  PainSc: 7          Complications: No apparent anesthesia complications

## 2020-01-06 NOTE — Anesthesia Postprocedure Evaluation (Addendum)
Anesthesia Post Note  Patient: TAGGART WASHABAUGH  Procedure(s) Performed: CYSTOSCOPY/URETEROSCOPY/HOLMIUM LASER/STENT PLACEMENT (Right Ureter) CYSTOSCOPY WITH BIOPSY (Right Ureter)  Patient location during evaluation: PACU Anesthesia Type: General Level of consciousness: awake and alert Pain management: pain level controlled Vital Signs Assessment: post-procedure vital signs reviewed and stable Respiratory status: spontaneous breathing, nonlabored ventilation, respiratory function stable and patient connected to nasal cannula oxygen Cardiovascular status: blood pressure returned to baseline and stable Postop Assessment: no apparent nausea or vomiting Anesthetic complications: no     Last Vitals:  Vitals:   01/06/20 1147 01/06/20 1202  BP: (!) 161/80 (!) 164/80  Pulse: 73 73  Resp: 13 11  Temp:  (!) 36.1 C  SpO2: 98% 99%    Last Pain:  Vitals:   01/06/20 1202  TempSrc:   PainSc: 0-No pain                 [name]

## 2020-01-06 NOTE — Anesthesia Procedure Notes (Signed)
Procedure Name: LMA Insertion Date/Time: 01/06/2020 10:41 AM Performed by: Gentry Fitz, CRNA Pre-anesthesia Checklist: Patient identified, Emergency Drugs available, Suction available and Patient being monitored Patient Re-evaluated:Patient Re-evaluated prior to induction Oxygen Delivery Method: Circle system utilized Preoxygenation: Pre-oxygenation with 100% oxygen Induction Type: IV induction Ventilation: Mask ventilation without difficulty LMA Size: 4.0 Number of attempts: 1 Placement Confirmation: positive ETCO2 and breath sounds checked- equal and bilateral

## 2020-01-06 NOTE — Discharge Instructions (Signed)
AMBULATORY SURGERY  DISCHARGE INSTRUCTIONS   1) The drugs that you were given will stay in your system until tomorrow so for the next 24 hours you should not:  A) Drive an automobile B) Make any legal decisions C) Drink any alcoholic beverage   2) You may resume regular meals tomorrow.  Today it is better to start with liquids and gradually work up to solid foods.  You may eat anything you prefer, but it is better to start with liquids, then soup and crackers, and gradually work up to solid foods.   3) Please notify your doctor immediately if you have any unusual bleeding, trouble breathing, redness and pain at the surgery site, drainage, fever, or pain not relieved by medication.    4) Additional Instructions:        Please contact your physician with any problems or Same Day Surgery at [phone], Monday through Friday 6 am to 4 pm, or [facility] number at [phone].You have a ureteral stent in place.  This is a tube that extends from your kidney to your bladder.  This may cause urinary bleeding, burning with urination, and urinary frequency.  Please call our office or present to the ED if you develop fevers >101 or pain which is not able to be controlled with oral pain medications.  You may be given either Flomax and/ or ditropan to help with bladder spasms and stent pain in addition to pain medications.    [facility] [address], [address] [place], [place] [address] [phone]  

## 2020-01-06 NOTE — H&P (Signed)
Updated today 01/05/19 RRR CTAB  No changes  TYTON ROTTIER  [birth year]  NP:[id number]  Referring provider: Biagio Borg, MD  [address]  [place], [place] [address]     Chief Complaint  Patient presents with  . Nephrolithiasis   HPI:  [name] is a 74 year old male with prostate cancer, OAB/pelvic floor dysfunction and a right ureteral calculi who presents today for persistent hydronephrosis.  Prostate cancer  High risk prostate cancer status post IMRT on ADT. PSA is undetectable. ADT through 04/2020.  OAB/pelvic floor dysfunction  Urodynamics which shows engagement of his pelvic floor musculature during voiding consistent with pelvic floor dysfunction. Satisfied with Vesicare at this time.  Right ureteral stone  CT abdomen pelvis with contrast on 10/18/2019 showed incidental 4 mm right distal ureteral calculi x2 with hydroureteronephrosis to this level. RUS 11/27/2019 Moderate RIGHT hydronephrosis, similar to prior CT exam. Tiny distal RIGHT ureteral calculus seen on the prior CT is not sonographically identified. Remainder of exam unremarkable. He states that he has not passed any fragments. He continues to have right-sided flank pain. His UA today is benign. KUB 12/04/2019 stones not visualized.  PMH:      Past Medical History:  Diagnosis Date  . Acute bronchitis 01/01/2015  . Acute upper respiratory infection 05/19/2016  . Anxiety   . Arthritis    back,wrists,hx. previous fractures  . BPH (benign prostatic hypertrophy)   . BRONCHITIS, CHRONIC 04/29/2008   Qualifier: Diagnosis of By: Halford Chessman MD, [place]   . CAD 10/16/2009   Qualifier: Diagnosis of By: Lynden Ang   . CAD (coronary artery disease)    angioplasty   . Chronic back pain   . Chronic bronchitis   . 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 Confusion 04/27/2017  . Depression   . Encounter for preventative adult health care exam with abnormal findings 12/20/2013  . Fever 04/27/2017  . GERD  (gastroesophageal reflux disease)   . Gynecomastia 05/12/2015  . H/O hiatal hernia    pts wife states pt does not have   . Heart murmur   . Hypercholesteremia   . Hyperglycemia 05/19/2016  . HYPERLIPIDEMIA 04/29/2008   Qualifier: Diagnosis of By: Jim Like   . Impaired glucose tolerance 12/20/2013  . Insomnia 04/27/2017  . Lumbosacral radiculopathy 10/14/2016  . MI (myocardial infarction) ([place])    mid-'90's  . MYOCARDIAL INFARCTION 04/29/2008   Qualifier: History of By: Jim Like   . OSA (obstructive sleep apnea) 05/13/2011   Split night study 2011: AHI 21/hr with obstructive and central events. Placed on cpap and titrated to 13cm with reasonable control, but attempts to increase pressure increased the number of central events. Medium resmed quattro full face mask used.   . Peripheral neuropathy    peripheral neuropathy  . Phimosis/adherent prepuce 10/21/2013  . Pneumonia   . Prostate cancer ([place])   . Shortness of breath 11/05/2010   Qualifier: Diagnosis of By: Marilynne Halsted, RN, BSN, Jacquelyn   . Sleep apnea    mild-no cpap, unable to tolerate  . Umbilical hernia   . Umbilical hernia without obstruction and without gangrene 05/19/2016  . URI (upper respiratory infection) 04/27/2017  . UTI (urinary tract infection) 12/01/2016  . Varicose veins   . Varicose veins of bilateral lower extremities with other complications [id number]  . Wheezing 01/01/2015   Surgical History:       Past Surgical History:  Procedure Laterality Date  . ANGIOPLASTY    .  APPENDECTOMY    . BACK SURGERY     x3  . CARDIAC CATHETERIZATION     11'11  . CIRCUMCISION N/A 10/21/2013   Procedure: CIRCUMCISION ADULT; Surgeon: Bernestine Amass, MD; Location: [facility]; Service: Urology; Laterality: N/A;  . CYSTOSCOPY N/A 10/21/2013   Procedure: CYSTOSCOPY FLEXIBLE; Surgeon: Bernestine Amass, MD; Location: [facility]; Service: Urology; Laterality: N/A;  . INSERTION OF MESH N/A 06/17/2016   Procedure: INSERTION OF  MESH; Surgeon: Excell Seltzer, MD; Location: [facility]; Service: General; Laterality: N/A;  . UMBILICAL HERNIA REPAIR N/A 06/17/2016   Procedure: REPAIR UMBILICAL HERNIA WITH MESH; Surgeon: Excell Seltzer, MD; Location: [facility]; Service: General; Laterality: N/A;  . WRIST SURGERY Right    Home Medications:       Allergies as of 12/04/2019      Reactions   Seroquel [quetiapine] Hives           Medication List       Accurate as of December 04, 2019 11:59 PM. If you have any questions, ask your nurse or doctor.        STOP taking these medications    DULoxetine 60 MG capsule  Commonly known as: CYMBALTA  Stopped by: SHANNON MCGOWAN, PA-C   Symproic 0.2 MG Tabs  Generic drug: Naldemedine Tosylate  Stopped by: SHANNON MCGOWAN, PA-C       TAKE these medications    Albuterol Sulfate 108 (90 Base) MCG/ACT Aepb  Inhale 2 puffs into the lungs 4 (four) times daily as needed.   ALPRAZolam 0.5 MG tablet  Commonly known as: XANAX  Take 0.5 mg by mouth daily.   aspirin 81 MG tablet  Take 81 mg by mouth daily.   cephALEXin 500 MG capsule  Commonly known as: KEFLEX  Take 1 capsule (500 mg total) by mouth 4 (four) times daily.   furosemide 40 MG tablet  Commonly known as: LASIX  Take 40 mg by mouth daily as needed.   linaclotide 72 MCG capsule  Commonly known as: Linzess  Take 1 capsule (72 mcg total) by mouth daily before breakfast.   potassium chloride 10 MEQ tablet  Commonly known as: KLOR-CON  TAKE 2 TABLETS BY MOUTH PER DAY ON DAYS TAKING FUROSEMIDE   simvastatin 80 MG tablet  Commonly known as: ZOCOR  TAKE 1 TABLET BY MOUTH EVERY EVENING   solifenacin 10 MG tablet  Commonly known as: VESICARE  TAKE 1 TABLET BY MOUTH DAILY   traZODone 50 MG tablet  Commonly known as: DESYREL  TAKE 1/2 TO 1 TABLET BY MOUTH AT BEDTIMEAS NEEDED FOR SLEEP       Allergies:      Allergies  Allergen Reactions  . Seroquel [Quetiapine] Hives   Family History:       Family History   Problem Relation Age of Onset  . Prostate cancer Father   . Emphysema Father   . Hyperlipidemia Father   . Hyperlipidemia Mother    Social History: reports that he quit smoking about 23 years ago. His smoking use included cigarettes. He has a 20.00 pack-year smoking history. He has never used smokeless tobacco. He reports that he does not drink alcohol or use drugs.  ROS:  UROLOGY  Frequent Urination?: No  Hard to postpone urination?: No  Burning/pain with urination?: No  Get up at night to urinate?: Yes  Leakage of urine?: No  Urine stream starts and stops?: No  Trouble starting stream?: No  Do you have to strain to urinate?: No  Blood in urine?: No  Urinary tract infection?: No  Sexually transmitted disease?: No  Injury to kidneys or bladder?: No  Painful intercourse?: No  Weak stream?: No  Erection problems?: No  Penile pain?: No  Gastrointestinal  Nausea?: No  Vomiting?: No  Indigestion/heartburn?: No  Diarrhea?: No  Constipation?: No  Constitutional  Fever: No  Night sweats?: No  Weight loss?: Yes  Fatigue?: No  Skin  Skin rash/lesions?: No  Itching?: No  Eyes  Blurred vision?: Yes  Double vision?: No  Ears/Nose/Throat  Sore throat?: No  Sinus problems?: No  Hematologic/Lymphatic  Swollen glands?: No  Easy bruising?: No  Cardiovascular  Leg swelling?: No  Chest pain?: No  Respiratory  Cough?: No  Shortness of breath?: No  Endocrine  Excessive thirst?: No  Musculoskeletal  Back pain?: Yes  Joint pain?: No  Neurological  Headaches?: No  Dizziness?: No  Psychologic  Depression?: Yes  Anxiety?: No  Physical Exam:  BP (!) 203/84  Pulse 74  Ht 5' 10.5" (1.791 m)  Wt 194 lb 11.2 oz (88.3 kg)  BMI 27.54 kg/m  Constitutional: Well nourished. Alert and oriented, No acute distress.  HEENT:  [place] AT, moist mucus membranes. Trachea midline, no masses.  Cardiovascular: No clubbing, cyanosis, or edema.  Respiratory: Normal respiratory effort, no  increased work of breathing.  GI: Abdomen is soft, non tender, non distended, no abdominal masses. Liver and spleen not palpable. No hernias appreciated. Stool sample for occult testing is not indicated.  GU: No CVA tenderness. No bladder fullness or masses.  Neurologic: Grossly intact, no focal deficits, moving all 4 extremities.  Psychiatric: Normal mood and affect.  Laboratory Data:  Recent Labs                                               Recent Labs                       Recent [employer]     Recent Labs                       Recent [employer] (Brief)                                               Recent Labs                       Recent Labs                       Recent Labs     Recent Labs     Recent Labs    Urinalysis  Component Latest Ref Rng & Units 12/04/2019  Specific Gravity, UA 1.005 - 1.030 1.010  pH, UA 5.0 - 7.5 5.5  Color, UA Yellow Yellow  Appearance Ur Clear Clear  Leukocytes,UA Negative Negative  Protein,UA Negative/Trace Negative  Glucose,  UA Negative Negative  Ketones, UA Negative Negative  RBC, UA Negative Negative  Bilirubin, UA Negative Negative  Urobilinogen, Ur 0.2 - 1.0 mg/dL 0.2  Nitrite, UA Negative Negative  Microscopic Examination  See below:   Component Latest Ref Rng & Units 12/04/2019    1:48 PM  WBC, UA 0 - 5 /hpf 0-5  RBC 4.14 - 5.80 x10E6/uL None seen  Epithelial Cells (non renal) 0 - 10 /hpf 0-10  Bacteria, UA None seen/Few None seen  I have reviewed the labs.  Pertinent Imaging:  CLINICAL DATA: Right-sided flank pain  EXAM:  ABDOMEN - 1 VIEW  COMPARISON: The  FINDINGS:  The bowel gas pattern is normal. No radio-opaque calculi or other  significant radiographic abnormality are seen. Previous interbody  fusion at L4-5 and L5-S1.  IMPRESSION:  No urinary tract calculi identified.  Electronically  Signed  By: Kerby Moors M.D.  On: 12/04/2019 18:06  I have independently reviewed the films and do not see a stone.  Assessment & Plan:  1. Right ureteral stones  - schedule right ureteroscopy with laser lithotripsy and ureteral stent placement  - explained to the patient how the procedure is performed and the risks involved  - informed patient that they will have a stent placed during the procedure and will remain in place after the procedure for a short time.  - stent may be removed in the office with a cystoscope or patient may be instructed to remove the stent themselves by the string  - described "stent pain" as feelings of needing to urinate/overactive bladder and a warm, tingling sensation to intense pain in the affected flank  - residual stones within the kidney or ureter may be present after the procedure and may need to have these addressed at a different encounter  - injury to the ureter is the most common intra-operative risk, it may result in an open procedure to correct the defect  - infection and bleeding are also risks  - explained the risks of general anesthesia, such as: MI, CVA, paralysis, coma and/or death  - UA  - Urine culture  - BMP  - CBC  - advised to contact our office or seek treatment in the ED if becomes febrile or pain/ vomiting are difficult control in order to arrange for emergent/urgent intervention  2. Right hydronephrosis  - obtain RUS to ensure the hydronephrosis has resolved once they have passed and/or recovered from procedure to ensure to iatrogenic hydronephrosis remains  3. High risk prostate cancer  Has appointments in May 2021 with Dr. Erlene Quan  4. OAB/pelvic floor dysfunction  Continue Vesicare  Return for right URS/LL/ureteral stent placement .  These notes generated with voice recognition software. I apologize for typographical errors.  Zara Council, PA-C  [facility]  [address] [place]  [place],  [place] [address]  [phone]

## 2020-01-07 ENCOUNTER — Encounter: Payer: Self-pay | Admitting: Urology

## 2020-01-07 ENCOUNTER — Telehealth: Payer: Self-pay | Admitting: Urology

## 2020-01-07 LAB — SURGICAL PATHOLOGY

## 2020-01-07 NOTE — Telephone Encounter (Signed)
Pt's wife called this morning to ask a question about drug interactions.  He had surgery w/Brandon yesterday and she prescribed Flomax.  Pt is currently taking Cymbalta and wanted to make sure there wouldn't be any interactions between the two.

## 2020-01-07 NOTE — Telephone Encounter (Signed)
No issues

## 2020-01-09 NOTE — Telephone Encounter (Signed)
Left patient a VM with details, asked to return call with any questions.  

## 2020-01-13 ENCOUNTER — Ambulatory Visit: Payer: Medicare Other | Admitting: Internal Medicine

## 2020-01-13 LAB — STONE ANALYSIS
Calcium Oxalate Monohydrate: 100 %
Weight Calculi: 1 mg

## 2020-01-15 ENCOUNTER — Telehealth: Payer: Self-pay | Admitting: Urology

## 2020-01-15 ENCOUNTER — Encounter: Payer: Self-pay | Admitting: Urology

## 2020-01-15 NOTE — Telephone Encounter (Signed)
Pt's wife called and states that he is passing some blood when he urinates and would like a call back to discuss if this normal after surgery.

## 2020-01-15 NOTE — Telephone Encounter (Addendum)
Patient's wife and patient called back to discuss symptoms. He has been voiding without difficulty-noticed some small blood clots. Has been having mild flank pain. Instructed if symptoms worsen or unable to void or pain worsens to seek medical attention. Patient stopped taking Vesicare, wanted to know if he needs to start back or keep taking Flomax?

## 2020-01-16 NOTE — Telephone Encounter (Addendum)
Patient informed verbalized understanding

## 2020-01-16 NOTE — Telephone Encounter (Signed)
With a stent in place, this is totally normal.  This should have been written out on his discharge summary as well.  Flank pain is also normal.  Use ibuprofen or Tylenol as needed.  Take Vesicare if he has urgency or frequency.  Flomax can help with stent pain and if he is having a tough time, then start taking his medication again.  Hollice Espy, MD

## 2020-01-19 ENCOUNTER — Encounter: Payer: Self-pay | Admitting: Urology

## 2020-01-20 ENCOUNTER — Telehealth: Payer: Self-pay

## 2020-01-20 NOTE — Telephone Encounter (Signed)
As discussed with the patient's wife, the patient has a very significant ureteral stricture which will have to be addressed, either managed with a chronic indwelling stent or more definitive surgery.  This will be discussed at his follow-up visit.  In the interim, it may be helpful to check him for urinary tract infection given his ongoing dysuria.  Otherwise we just need to manage his stent discomfort as per usual.  Hollice Espy, MD

## 2020-01-20 NOTE — Telephone Encounter (Signed)
Called patient to follow up on mychart message sent over the weekend that states he is passing large blood clots and having dysuria. Patient's wife answered and states patient is urinating ok some dribbling but still passing blood and some large clots noted and still having dysuria, denies fever, chills, nausea or vomiting. She was notified that this can happen with a stent in place and to continue flomax and increase water intake

## 2020-01-20 NOTE — Telephone Encounter (Signed)
Patient notified and offered an appointment with PA today he declined and was made an appointment for tomorrow

## 2020-01-21 ENCOUNTER — Other Ambulatory Visit: Payer: Self-pay | Admitting: Internal Medicine

## 2020-01-21 ENCOUNTER — Ambulatory Visit: Payer: Self-pay | Admitting: Physician Assistant

## 2020-01-28 ENCOUNTER — Encounter: Payer: Self-pay | Admitting: Urology

## 2020-01-28 ENCOUNTER — Other Ambulatory Visit: Payer: Self-pay

## 2020-01-28 ENCOUNTER — Ambulatory Visit: Payer: Medicare Other | Admitting: Urology

## 2020-01-28 VITALS — BP 133/65 | HR 96 | Ht 70.0 in | Wt 194.0 lb

## 2020-01-28 DIAGNOSIS — C61 Malignant neoplasm of prostate: Secondary | ICD-10-CM | POA: Diagnosis not present

## 2020-01-28 DIAGNOSIS — R350 Frequency of micturition: Secondary | ICD-10-CM

## 2020-01-28 DIAGNOSIS — N135 Crossing vessel and stricture of ureter without hydronephrosis: Secondary | ICD-10-CM | POA: Diagnosis not present

## 2020-01-28 NOTE — Patient Instructions (Signed)
COVID-19 Vaccine Information can be found at: [email or web address] For questions related to vaccine distribution or appointments, please email [email or web address] or call [phone].    

## 2020-01-28 NOTE — Progress Notes (Signed)
01/28/2020 1:13 PM   Albert Wade [birth year] [id number]  Referring provider: Biagio Borg, MD [address] [place],  [place] [address]  Chief Complaint  Patient presents with  . Prostate Cancer    follow up    HPI: 74 year old male with multiple GU issues who presents today primarily to discuss management of his newly identified right distal ureteral stricture.  Notably, he was found to have incidental right ureteral calculi and hydronephrosis on CT scan 09/2019.  He failed to pass the spontaneously.  He was otherwise asymptomatic.  He also elected to undergo ureteroscopic intervention on 01/06/2020.  The stones were treated but he is found to have a several centimeters severe right distal ureteral stricture with severe hydroureteronephrosis at the level of the transition point.  Several biopsies were taken, this was benign.  Interesting, urothelium was not present for evaluation and there are scattered fragments of fibromuscular stroma consistent with ureteral wall.  He has been tolerating the stent fairly well.  He is concerned today that has been having intermittent gross hematuria when he is more active.  Is not painful.  He also has some occasional urinary urgency frequency which is his baseline.  He was previously prescribed Vesicare but has not been taking this medication.   PMH: Past Medical History:  Diagnosis Date  . Acute bronchitis 01/01/2015  . Acute upper respiratory infection 05/19/2016  . Anxiety   . Arthritis    back,wrists,hx. previous fractures  . BPH (benign prostatic hypertrophy)   . BRONCHITIS, CHRONIC 04/29/2008   Qualifier: Diagnosis of  By: Halford Chessman MD, Vineet    . CAD 10/16/2009   Qualifier: Diagnosis of  By: Lynden Ang    . CAD (coronary artery disease)    angioplasty   . Chronic back pain   . Chronic bronchitis   . 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 Confusion 04/27/2017  . Depression   . Encounter for preventative adult  health care exam with abnormal findings 12/20/2013  . Fever 04/27/2017  . GERD (gastroesophageal reflux disease)   . Gynecomastia 05/12/2015  . H/O hiatal hernia    pts wife states pt does not have   . Heart murmur   . History of kidney stones   . Hypercholesteremia   . Hyperglycemia 05/19/2016  . HYPERLIPIDEMIA 04/29/2008   Qualifier: Diagnosis of  By: Jim Like    . Impaired glucose tolerance 12/20/2013  . Insomnia 04/27/2017  . Lumbosacral radiculopathy 10/14/2016  . MI (myocardial infarction) ([place])    mid-'90's  . MYOCARDIAL INFARCTION 04/29/2008   Qualifier: History of  By: Jim Like    . OSA (obstructive sleep apnea) 05/13/2011   Split night study 2011:  AHI 21/hr with obstructive and central events.  Placed on cpap and titrated to 13cm with reasonable control, but attempts to increase pressure increased the number of central events.  Medium resmed quattro full face mask used.    . Peripheral neuropathy    peripheral neuropathy  . Phimosis/adherent prepuce 10/21/2013  . Pneumonia   . Prostate cancer ([place])   . Shortness of breath 11/05/2010   Qualifier: Diagnosis of  By: Marilynne Halsted, RN, BSN, Jacquelyn    . Sleep apnea    mild-no cpap, unable to tolerate  . Umbilical hernia   . Umbilical hernia without obstruction and without gangrene 05/19/2016  . URI (upper respiratory infection) 04/27/2017  . UTI (urinary tract infection) 12/01/2016  . Varicose veins   . Varicose veins of bilateral  lower extremities with other complications [id number]  . Wheezing 01/01/2015    Surgical History: Past Surgical History:  Procedure Laterality Date  . ANGIOPLASTY    . APPENDECTOMY    . BACK SURGERY     x3  . CARDIAC CATHETERIZATION     11'11  . CIRCUMCISION N/A 10/21/2013   Procedure: CIRCUMCISION ADULT;  Surgeon: Bernestine Amass, MD;  Location: [facility];  Service: Urology;  Laterality: N/A;  . CYSTOSCOPY N/A 10/21/2013   Procedure: CYSTOSCOPY FLEXIBLE;  Surgeon: Bernestine Amass, MD;   Location: [facility];  Service: Urology;  Laterality: N/A;  . CYSTOSCOPY WITH BIOPSY Right 01/06/2020   Procedure: CYSTOSCOPY WITH BIOPSY;  Surgeon: Hollice Espy, MD;  Location: [facility];  Service: Urology;  Laterality: Right;  . CYSTOSCOPY/URETEROSCOPY/HOLMIUM LASER/STENT PLACEMENT Right 01/06/2020   Procedure: CYSTOSCOPY/URETEROSCOPY/HOLMIUM LASER/STENT PLACEMENT;  Surgeon: Hollice Espy, MD;  Location: [facility];  Service: Urology;  Laterality: Right;  . INSERTION OF MESH N/A 06/17/2016   Procedure: INSERTION OF MESH;  Surgeon: Excell Seltzer, MD;  [facility];  Service: General;  Laterality: N/A;  . UMBILICAL HERNIA REPAIR N/A 06/17/2016   Procedure: REPAIR UMBILICAL HERNIA WITH MESH;  Surgeon: Excell Seltzer, MD;  [facility];  Service: General;  Laterality: N/A;  . WRIST SURGERY Right     Home Medications:  Allergies as of 01/28/2020      Reactions   Seroquel [quetiapine] Hives      Medication List       Accurate as of January 28, 2020  1:13 PM. If you have any questions, ask your nurse or doctor.        STOP taking these medications   cyclobenzaprine 10 MG tablet Commonly known as: FLEXERIL Stopped by: Hollice Espy, MD     TAKE these medications   Albuterol Sulfate 108 (90 Base) MCG/ACT Aepb Inhale 2 puffs into the lungs 4 (four) times daily as needed.   ALPRAZolam 0.5 MG tablet Commonly known as: XANAX Take 0.5 mg by mouth every evening.   aspirin 81 MG tablet Take 81 mg by mouth daily.   furosemide 40 MG tablet Commonly known as: LASIX Take 40 mg by mouth daily as needed for edema.   linaclotide 72 MCG capsule Commonly known as: Linzess Take 1 capsule (72 mcg total) by mouth daily before breakfast.   oxyCODONE-acetaminophen 10-325 MG tablet Commonly known as: PERCOCET Take 1 tablet by mouth every 4 (four) hours as needed for pain.   potassium chloride 10 MEQ tablet Commonly known as: KLOR-CON TAKE 2 TABLETS BY MOUTH PER DAY ON DAYS TAKING  FUROSEMIDE   simvastatin 80 MG tablet Commonly known as: ZOCOR TAKE 1 TABLET BY MOUTH EVERY EVENING   solifenacin 10 MG tablet Commonly known as: VESICARE TAKE 1 TABLET BY MOUTH DAILY   tamsulosin 0.4 MG Caps capsule Commonly known as: Flomax Take 1 capsule (0.4 mg total) by mouth daily.   traZODone 50 MG tablet Commonly known as: DESYREL TAKE 1/2 TO 1 TABLET BY MOUTH AT BEDTIMEAS NEEDED FOR SLEEP       Allergies:  Allergies  Allergen Reactions  . Seroquel [Quetiapine] Hives    Family History: Family History  Problem Relation Age of Onset  . Prostate cancer Father   . Emphysema Father   . Hyperlipidemia Father   . Hyperlipidemia Mother     Social History:  reports that he quit smoking about 24 years ago. His smoking use included cigarettes. He has a 20.00 pack-year smoking history. He  has never used smokeless tobacco. He reports that he does not drink alcohol or use drugs.  ROS: UROLOGY Frequent Urination?: No Hard to postpone urination?: No Burning/pain with urination?: No Get up at night to urinate?: Yes Leakage of urine?: No Urine stream starts and stops?: No Trouble starting stream?: No Do you have to strain to urinate?: No Blood in urine?: Yes Urinary tract infection?: No Sexually transmitted disease?: No Injury to kidneys or bladder?: No Painful intercourse?: No Weak stream?: No Erection problems?: No Penile pain?: No  Gastrointestinal Nausea?: No Vomiting?: No Indigestion/heartburn?: No Diarrhea?: No Constipation?: No  Constitutional Fever: No Night sweats?: No Weight loss?: No Fatigue?: No  Skin Skin rash/lesions?: No Itching?: No  Eyes Blurred vision?: No Double vision?: No  Ears/Nose/Throat Sore throat?: No Sinus problems?: No  Hematologic/Lymphatic Swollen glands?: No Easy bruising?: No  Cardiovascular Leg swelling?: No Chest pain?: No  Respiratory Cough?: No Shortness of breath?: No  Endocrine Excessive  thirst?: No  Musculoskeletal Back pain?: No Joint pain?: No  Neurological Headaches?: No Dizziness?: No  Psychologic Depression?: No Anxiety?: No  Physical Exam: BP 133/65   Pulse 96   Ht 5\' 10"  (1.778 m)   Wt 194 lb (88 kg)   BMI 27.84 kg/m   Constitutional:  Alert and oriented, No acute distress.  Accompanied by wife today. HEENT: [place] AT, moist mucus membranes.  Trachea midline, no masses. Cardiovascular: No clubbing, cyanosis, or edema. Respiratory: Normal respiratory effort, no increased work of breathing. Skin: No rashes, bruises or suspicious lesions. Neurologic: Grossly intact, no focal deficits, moving all 4 extremities. Psychiatric: Normal mood and affect.  Laboratory Data: Lab Results  Component Value Date   WBC 6.4 12/04/2019   HGB 12.0 (L) 12/04/2019   HCT 34.6 (L) 12/04/2019   MCV 90 12/04/2019   PLT 199 12/04/2019    Lab Results  Component Value Date   CREATININE 0.89 12/04/2019    Lab Results  Component Value Date   HGBA1C 6.2 10/08/2019   PSA undetectable 10/2019  Assessment & Plan:    1. Urinary frequency Advised to resume taking Vesicare, this will likely improve his stent discomfort  2. Prostate cancer ([place]) PSA is undetectable  No further ADT at this time  We will reassess at next visit as previously scheduled  3. Ureteral stricture, right The discussion today regarding the severity of his right ureteral stricture as well as management options  Surgical pathology is benign.  Suspect stricture likely related to radiation stricture.  Stones incidental and likely secondary to incomplete bladder emptying.  We had a lengthy discussion today of how to manage this high-grade and relatively long stricture.  Endoscopic balloon dilation was discussed although the probability of this being 6 was relatively low based on the length and presumed nature of the stricture.  We also discussed management with chronic indwelling stent.  Expectant  management with stent removal and Lasix scan to assess the degree of obstruction although would anticipate some renal parenchymal loss over time as well as robotic ureteral reimplant with possible Boari flap.  Risk and benefits of each modality was discussed.  Given his multiple medical conditions and overall health as well as desire to avoid major surgery, he believes that he would like to be managed with a chronic indwelling stent.  He currently has a Bard Optima stent which can remain for 6 months to year depending on the degree of encrustation.  We will have him follow-up as scheduled in a few months to  discuss stent exchange.  In the interim, continue Flomax and begin Vesicare which will help with stent discomfort.  He was reassured that blood in the urine is normal especially in the setting of recent ureteral biopsies.  All questions answered.  We will continue to reassess.   Hollice Espy, MD  [facility] [address] [place], [place] [address] [phone]  I spent 40 min with this patient of which greater than 50% was spent in counseling and coordination of care with the patient.

## 2020-01-29 ENCOUNTER — Ambulatory Visit: Payer: Medicare Other | Admitting: Podiatry

## 2020-01-29 ENCOUNTER — Other Ambulatory Visit: Payer: Self-pay

## 2020-01-29 ENCOUNTER — Encounter: Payer: Self-pay | Admitting: Podiatry

## 2020-01-29 ENCOUNTER — Ambulatory Visit (INDEPENDENT_AMBULATORY_CARE_PROVIDER_SITE_OTHER): Payer: Medicare Other

## 2020-01-29 DIAGNOSIS — M2041 Other hammer toe(s) (acquired), right foot: Secondary | ICD-10-CM

## 2020-01-29 DIAGNOSIS — M2011 Hallux valgus (acquired), right foot: Secondary | ICD-10-CM

## 2020-01-29 DIAGNOSIS — Z9889 Other specified postprocedural states: Secondary | ICD-10-CM

## 2020-01-29 DIAGNOSIS — M2580 Other specified joint disorders, unspecified joint: Secondary | ICD-10-CM

## 2020-01-29 NOTE — Progress Notes (Signed)
He presents today states he is very happy with his bunion surgery we did do surgery October 25, 2019 [facility] bunion repair.  States that he may have stepped wrong the other day because of pain right in here as he points to the medial aspect of the tibial sesamoid right foot.  Objective: Vital signs are stable alert and oriented x3 no change in physical exam.  He does have tenderness on palpation of the tibial sesamoid.  Radiographs do not demonstrate any changes.  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  Plan: I injected 2 mg of dexamethasone after sterile Betadine skin prep for this.  I will follow-up with him in 4 to 6 weeks.

## 2020-01-31 DIAGNOSIS — K5909 Other constipation: Secondary | ICD-10-CM | POA: Diagnosis not present

## 2020-01-31 DIAGNOSIS — G894 Chronic pain syndrome: Secondary | ICD-10-CM | POA: Diagnosis not present

## 2020-01-31 DIAGNOSIS — Z79891 Long term (current) use of opiate analgesic: Secondary | ICD-10-CM | POA: Diagnosis not present

## 2020-01-31 DIAGNOSIS — M48061 Spinal stenosis, lumbar region without neurogenic claudication: Secondary | ICD-10-CM | POA: Diagnosis not present

## 2020-01-31 DIAGNOSIS — M25579 Pain in unspecified ankle and joints of unspecified foot: Secondary | ICD-10-CM | POA: Diagnosis not present

## 2020-01-31 DIAGNOSIS — Z5181 Encounter for therapeutic drug level monitoring: Secondary | ICD-10-CM | POA: Diagnosis not present

## 2020-02-03 ENCOUNTER — Other Ambulatory Visit: Payer: Self-pay

## 2020-02-03 MED ORDER — TAMSULOSIN HCL 0.4 MG PO CAPS: 0.4000 mg | ORAL_CAPSULE | Freq: Every day | ORAL | 0 refills | Status: DC

## 2020-02-03 NOTE — Telephone Encounter (Signed)
Patient's wife called wanting to know if he should continue taking flomax and if so they need a refill?

## 2020-02-04 NOTE — Telephone Encounter (Signed)
Left message for patient's wife notifying her that Dr. Erlene Quan said he may continue medication to help with stent discomfort

## 2020-02-11 ENCOUNTER — Telehealth: Payer: Self-pay | Admitting: Internal Medicine

## 2020-02-11 NOTE — Telephone Encounter (Signed)
New Message   Patient's wife is calling in to get approval to accompany patient to his appointment on 02/14/20. States that patient is very forgetful and she needs to be at appointment with patient. Please give patient's wofe a call back to confirm

## 2020-02-11 NOTE — Telephone Encounter (Signed)
Patient's wife will accompany patient to appointment.

## 2020-02-13 NOTE — Progress Notes (Signed)
Cardiology Office Note   Date:  02/14/2020   ID:  [name], Albert Wade 03-14-1946, [name] [id number]  PCP:  Biagio Borg, MD  Cardiologist:   Dorris Carnes, MD    F/U of mild CAD and HTN   History of Present Illness: Albert Wade is a 74 y.o. male with a history of mild CAD by cath in 2011, COPD ,HL and HTN  The pt was last seen in cardiology by Buren Kos in July 2020     The pt is being seen at [facility]   Had a stent placed because of radiation damage.   The pt says he has had significant blood in urine (clots)  Worried about having another surgery.    He has been fatigued, gives out     He denies CP    Outpatient Medications Prior to Visit  Medication Sig Dispense Refill  . Albuterol Sulfate 108 (90 Base) MCG/ACT AEPB Inhale 2 puffs into the lungs 4 (four) times daily as needed. 1 each 11  . ALPRAZolam (XANAX) 0.5 MG tablet Take 0.5 mg by mouth every evening.     Marland Kitchen aspirin 81 MG tablet Take 81 mg by mouth daily.    . furosemide (LASIX) 40 MG tablet Take 40 mg by mouth daily as needed for edema.     Marland Kitchen linaclotide (LINZESS) 72 MCG capsule Take 1 capsule (72 mcg total) by mouth daily before breakfast. 30 capsule 5  . oxyCODONE-acetaminophen (PERCOCET) 10-325 MG tablet Take 1 tablet by mouth every 4 (four) hours as needed for pain. 30 tablet 0  . potassium chloride (K-DUR) 10 MEQ tablet TAKE 2 TABLETS BY MOUTH PER DAY ON DAYS TAKING FUROSEMIDE 180 tablet 3  . simvastatin (ZOCOR) 80 MG tablet TAKE 1 TABLET BY MOUTH EVERY EVENING 90 tablet 1  . solifenacin (VESICARE) 10 MG tablet TAKE 1 TABLET BY MOUTH DAILY 90 tablet 3  . tamsulosin (FLOMAX) 0.4 MG CAPS capsule Take 1 capsule (0.4 mg total) by mouth daily. 30 capsule 0  . traZODone (DESYREL) 50 MG tablet TAKE 1/2 TO 1 TABLET BY MOUTH AT BEDTIMEAS NEEDED FOR SLEEP 90 tablet 0   No facility-administered medications prior to visit.     Allergies:   Seroquel [quetiapine]   Past Medical History:  Diagnosis Date  . Acute  bronchitis 01/01/2015  . Acute upper respiratory infection 05/19/2016  . Anxiety   . Arthritis    back,wrists,hx. previous fractures  . BPH (benign prostatic hypertrophy)   . BRONCHITIS, CHRONIC 04/29/2008   Qualifier: Diagnosis of  By: Halford Chessman MD, Vineet    . CAD 10/16/2009   Qualifier: Diagnosis of  By: Lynden Ang    . CAD (coronary artery disease)    angioplasty   . Chronic back pain   . Chronic bronchitis   . 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 Confusion 04/27/2017  . Depression   . Encounter for preventative adult health care exam with abnormal findings 12/20/2013  . Fever 04/27/2017  . GERD (gastroesophageal reflux disease)   . Gynecomastia 05/12/2015  . H/O hiatal hernia    pts wife states pt does not have   . Heart murmur   . History of kidney stones   . Hypercholesteremia   . Hyperglycemia 05/19/2016  . HYPERLIPIDEMIA 04/29/2008   Qualifier: Diagnosis of  By: Jim Like    . Impaired glucose tolerance 12/20/2013  . Insomnia 04/27/2017  . Lumbosacral radiculopathy 10/14/2016  . MI (myocardial infarction) ([place])  mid-'90's  . MYOCARDIAL INFARCTION 04/29/2008   Qualifier: History of  By: Jim Like    . OSA (obstructive sleep apnea) 05/13/2011   Split night study 2011:  AHI 21/hr with obstructive and central events.  Placed on cpap and titrated to 13cm with reasonable control, but attempts to increase pressure increased the number of central events.  Medium resmed quattro full face mask used.    . Peripheral neuropathy    peripheral neuropathy  . Phimosis/adherent prepuce 10/21/2013  . Pneumonia   . Prostate cancer ([place])   . Shortness of breath 11/05/2010   Qualifier: Diagnosis of  By: Marilynne Halsted, RN, BSN, Jacquelyn    . Sleep apnea    mild-no cpap, unable to tolerate  . Umbilical hernia   . Umbilical hernia without obstruction and without gangrene 05/19/2016  . URI (upper respiratory infection) 04/27/2017  . UTI (urinary tract infection)  12/01/2016  . Varicose veins   . Varicose veins of bilateral lower extremities with other complications [id number]  . Wheezing 01/01/2015    Past Surgical History:  Procedure Laterality Date  . ANGIOPLASTY    . APPENDECTOMY    . BACK SURGERY     x3  . CARDIAC CATHETERIZATION     11'11  . CIRCUMCISION N/A 10/21/2013   Procedure: CIRCUMCISION ADULT;  Surgeon: Bernestine Amass, MD;  [facility];  Service: Urology;  Laterality: N/A;  . CYSTOSCOPY N/A 10/21/2013   Procedure: CYSTOSCOPY FLEXIBLE;  Surgeon: Bernestine Amass, MD;  [facility];  Service: Urology;  Laterality: N/A;  . CYSTOSCOPY WITH BIOPSY Right 01/06/2020   Procedure: CYSTOSCOPY WITH BIOPSY;  Surgeon: Hollice Espy, MD;  Location: [facility];  Service: Urology;  Laterality: Right;  . CYSTOSCOPY/URETEROSCOPY/HOLMIUM LASER/STENT PLACEMENT Right 01/06/2020   Procedure: CYSTOSCOPY/URETEROSCOPY/HOLMIUM LASER/STENT PLACEMENT;  Surgeon: Hollice Espy, MD;  Location: [facility];  Service: Urology;  Laterality: Right;  . INSERTION OF MESH N/A 06/17/2016   Procedure: INSERTION OF MESH;  Surgeon: Excell Seltzer, MD;  [facility];  Service: General;  Laterality: N/A;  . UMBILICAL HERNIA REPAIR N/A 06/17/2016   Procedure: REPAIR UMBILICAL HERNIA WITH MESH;  Surgeon: Excell Seltzer, MD;  [facility];  Service: General;  Laterality: N/A;  . WRIST SURGERY Right      Social History:  The patient  reports that he quit smoking about 24 years ago. His smoking use included cigarettes. He has a 20.00 pack-year smoking history. He has never used smokeless tobacco. He reports that he does not drink alcohol or use drugs.   Family History:  The patient's family history includes Emphysema in his father; Hyperlipidemia in his father and mother; Prostate cancer in his father.    ROS:  Please see the history of present illness. All other systems are reviewed and  Negative to the above problem except as noted.    PHYSICAL EXAM: VS:   BP  148/76   P 66   Wt 192 lb  Sats on RA (98-99%   Stayed same with walking) GEN:  Obese 74 yo  in no acute distress  HEENT: normal  Neck: no JVD, carotid bruits, Cardiac: RRR; no murmurs, rubs, or gallops,no edema  Respiratory:  clear to auscultation bilaterally, normal work of breathing GI: soft, nontender, nondistended, + BS  No hepatomegaly  MS: no deformity Moving all extremities   Skin: warm and dry, no rash Neuro:  Strength and sensation are intact Psych: euthymic mood, full affect   EKG:  EKG is not ordered today  Lipid Panel    Component Value Date/Time   CHOL 114 06/18/2019 1020   TRIG 104.0 06/18/2019 1020   HDL 41.70 06/18/2019 1020   CHOLHDL 3 06/18/2019 1020   VLDL 20.8 06/18/2019 1020   LDLCALC 51 06/18/2019 1020   LDLDIRECT 78.0 05/19/2016 1610      Wt Readings from Last 3 Encounters:  02/14/20 192 lb 3.2 oz (87.2 kg)  01/28/20 194 lb (88 kg)  12/23/19 186 lb (84.4 kg)      ASSESSMENT AND PLAN:  1   Fatigue:   Pt says he gives out easy   I am not convinced angina   Will check CBC    Hold Aspirin for now.   Need t oreview records from outside (urology)  2  CAD   Mild by cath in 2011  Myovue in march 2017 showed no significant ischemia   LVEF normal Again, I am not convinced above represents angina    2.  HTN   BP is controlled       Signed, Dorris Carnes, MD  02/14/2020 12:49 PM    [place] [address], [place], [place]  [address] Phone: [phone]; Fax: [phone]

## 2020-02-14 ENCOUNTER — Ambulatory Visit: Payer: Medicare Other | Admitting: Internal Medicine

## 2020-02-14 ENCOUNTER — Other Ambulatory Visit: Payer: Self-pay

## 2020-02-14 VITALS — BP 148/76 | HR 66 | Ht 70.0 in | Wt 192.2 lb

## 2020-02-14 DIAGNOSIS — E782 Mixed hyperlipidemia: Secondary | ICD-10-CM

## 2020-02-14 DIAGNOSIS — I10 Essential (primary) hypertension: Secondary | ICD-10-CM

## 2020-02-14 DIAGNOSIS — I2510 Atherosclerotic heart disease of native coronary artery without angina pectoris: Secondary | ICD-10-CM

## 2020-02-14 DIAGNOSIS — Z1321 Encounter for screening for nutritional disorder: Secondary | ICD-10-CM | POA: Diagnosis not present

## 2020-02-14 DIAGNOSIS — E559 Vitamin D deficiency, unspecified: Secondary | ICD-10-CM | POA: Diagnosis not present

## 2020-02-14 NOTE — Patient Instructions (Addendum)
Medication Instructions:  Stop aspirin  *If you need a refill on your cardiac medications before your next appointment, please call your pharmacy*  Lab Work: Today: cbc, bmet, lipids, tsh, vit d  If you have labs (blood work) drawn today and your tests are completely normal, you will receive your results only by: Marland Kitchen MyChart Message (if you have MyChart) OR . A paper copy in the mail If you have any lab test that is abnormal or we need to change your treatment, we will call you to review the results.  Testing/Procedures: none  Follow-Up: At [facility], you and your health needs are our priority.  As part of our continuing mission to provide you with exceptional heart care, we have created designated Provider Care Teams.  These Care Teams include your primary Cardiologist (physician) and Advanced Practice Providers (APPs -  Physician Assistants and Nurse Practitioners) who all work together to provide you with the care you need, when you need it.  Your next appointment:   5 month(s)  The format for your next appointment:   In Person  Provider:   Dorris Carnes, MD  Other Instructions Pt signed release of information to request records from urology.

## 2020-02-15 LAB — CBC
Hematocrit: 38.1 % (ref 37.5–51.0)
Hemoglobin: 12.8 g/dL — ABNORMAL LOW (ref 13.0–17.7)
MCH: 30.7 pg (ref 26.6–33.0)
MCHC: 33.6 g/dL (ref 31.5–35.7)
MCV: 91 fL (ref 79–97)
Platelets: 186 10*3/uL (ref 150–450)
RBC: 4.17 x10E6/uL (ref 4.14–5.80)
RDW: 12.3 % (ref 11.6–15.4)
WBC: 5.4 10*3/uL (ref 3.4–10.8)

## 2020-02-15 LAB — BASIC METABOLIC PANEL WITH GFR
BUN/Creatinine Ratio: 12 (ref 10–24)
BUN: 11 mg/dL (ref 8–27)
CO2: 27 mmol/L (ref 20–29)
Calcium: 8.9 mg/dL (ref 8.6–10.2)
Chloride: 103 mmol/L (ref 96–106)
Creatinine, Ser: 0.89 mg/dL (ref 0.76–1.27)
GFR calc Af Amer: 98 mL/min/{1.73_m2}
GFR calc non Af Amer: 85 mL/min/{1.73_m2}
Glucose: 128 mg/dL — ABNORMAL HIGH (ref 65–99)
Potassium: 4.1 mmol/L (ref 3.5–5.2)
Sodium: 142 mmol/L (ref 134–144)

## 2020-02-15 LAB — LIPID PANEL
Chol/HDL Ratio: 2.7 ratio (ref 0.0–5.0)
Cholesterol, Total: 124 mg/dL (ref 100–199)
HDL: 46 mg/dL
LDL Chol Calc (NIH): 57 mg/dL (ref 0–99)
Triglycerides: 119 mg/dL (ref 0–149)
VLDL Cholesterol Cal: 21 mg/dL (ref 5–40)

## 2020-02-15 LAB — TSH: TSH: 1.27 u[IU]/mL (ref 0.450–4.500)

## 2020-02-15 LAB — VITAMIN D 25 HYDROXY (VIT D DEFICIENCY, FRACTURES): Vit D, 25-Hydroxy: 30.3 ng/mL (ref 30.0–100.0)

## 2020-02-16 ENCOUNTER — Ambulatory Visit: Payer: Medicare Other | Attending: Internal Medicine

## 2020-02-16 DIAGNOSIS — Z23 Encounter for immunization: Secondary | ICD-10-CM | POA: Insufficient documentation

## 2020-02-16 NOTE — Progress Notes (Signed)
   Covid-19 Vaccination Clinic  Name:  Albert Wade    [name]: [id number] DOB: [birth year]  02/16/2020  [name] was observed post Covid-19 immunization for 15 minutes without incidence. He was provided with Vaccine Information Sheet and instruction to access the V-Safe system.   [name] was instructed to call 911 with any severe reactions post vaccine: Marland Kitchen Difficulty breathing  . Swelling of your face and throat  . A fast heartbeat  . A bad rash all over your body  . Dizziness and weakness    Immunizations Administered    Name Date Dose VIS Date Route   Pfizer COVID-19 Vaccine 02/16/2020  8:46 AM 0.3 mL 12/06/2019 Intramuscular   Manufacturer: [place]   Lot: [id number]   [place]: [id number]

## 2020-02-24 ENCOUNTER — Telehealth: Payer: Self-pay | Admitting: Internal Medicine

## 2020-02-24 NOTE — Telephone Encounter (Signed)
[facility] called and stated that Dr. Harrington Challenger requested medical records of Albert Wade. She said since we both have EPIC, everything Dr. Harrington Challenger needs on here.

## 2020-02-25 ENCOUNTER — Telehealth: Payer: Self-pay | Admitting: Internal Medicine

## 2020-02-25 NOTE — Telephone Encounter (Signed)
Medical records requested from [facility] - Dr. Hollice Espy. 02/25/20 vlm

## 2020-03-05 NOTE — Telephone Encounter (Signed)
Pt has appt with Dr Karsten Ro to review/ eval current urologic problems Pt had been seen at [facility]  Presents for second opinion APpt :  March 24th at 10:30

## 2020-03-06 ENCOUNTER — Other Ambulatory Visit: Payer: Self-pay | Admitting: Urology

## 2020-03-09 ENCOUNTER — Other Ambulatory Visit: Payer: Self-pay | Admitting: Internal Medicine

## 2020-03-09 NOTE — Telephone Encounter (Signed)
Please refill as per office routine med refill policy (all routine meds refilled for 3 mo or monthly per pt preference up to one year from last visit, then month to month grace period for 3 mo, then further med refills will have to be denied)  

## 2020-03-10 ENCOUNTER — Ambulatory Visit: Payer: Medicare Other | Attending: Internal Medicine

## 2020-03-10 DIAGNOSIS — Z23 Encounter for immunization: Secondary | ICD-10-CM

## 2020-03-10 NOTE — Progress Notes (Signed)
   Covid-19 Vaccination Clinic  Name:  Albert Wade    [name]: [id number] DOB: 16-Jul-1946  03/10/2020  [name] was observed post Covid-19 immunization for 15 minutes without incident. He was provided with Vaccine Information Sheet and instruction to access the V-Safe system.   [name] was instructed to call 911 with any severe reactions post vaccine: Marland Kitchen Difficulty breathing  . Swelling of face and throat  . A fast heartbeat  . A bad rash all over body  . Dizziness and weakness   Immunizations Administered    Name Date Dose VIS Date Route   Pfizer COVID-19 Vaccine 03/10/2020  8:29 AM 0.3 mL 12/06/2019 Intramuscular   Manufacturer: [place]   Lot: [id number]   [place]: [id number]

## 2020-03-11 ENCOUNTER — Ambulatory Visit: Payer: Medicare Other | Admitting: Podiatry

## 2020-03-18 DIAGNOSIS — R310 Gross hematuria: Secondary | ICD-10-CM | POA: Diagnosis not present

## 2020-03-18 DIAGNOSIS — N135 Crossing vessel and stricture of ureter without hydronephrosis: Secondary | ICD-10-CM | POA: Diagnosis not present

## 2020-03-19 ENCOUNTER — Telehealth: Payer: Self-pay | Admitting: Radiology

## 2020-03-19 NOTE — Telephone Encounter (Signed)
Wife called stating patient would like to have "kidney surgery". LMOM to return call. Dr Erlene Quan would like to have a virtual visit to discuss. Appointment has been scheduled.

## 2020-04-08 ENCOUNTER — Other Ambulatory Visit: Payer: Self-pay | Admitting: Internal Medicine

## 2020-04-14 ENCOUNTER — Telehealth (INDEPENDENT_AMBULATORY_CARE_PROVIDER_SITE_OTHER): Payer: Medicare Other | Admitting: Urology

## 2020-04-14 ENCOUNTER — Other Ambulatory Visit: Payer: Self-pay

## 2020-04-14 NOTE — Progress Notes (Signed)
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 She did have questions and wanted to be available during appt. We discussed the rules and notified her that she can only be present via phone call based on current [facility] policy, will notify her if it changes.  Jamas Lav, am acting as a scribe for Dr. Hollice Espy,  I have reviewed the above documentation for accuracy and completeness, and I agree with the above.   Hollice Espy, MD

## 2020-04-16 ENCOUNTER — Telehealth: Payer: Self-pay

## 2020-04-16 MED ORDER — LINACLOTIDE 145 MCG PO CAPS: 145.0000 ug | ORAL_CAPSULE | Freq: Every day | ORAL | 5 refills | Status: DC

## 2020-04-16 NOTE — Telephone Encounter (Signed)
Ok for the linzess 145 - done erx

## 2020-04-16 NOTE — Telephone Encounter (Signed)
New message    The wife calling    Pt c/o medication issue:  1. Name of Medication: linaclotide (LINZESS) 72 MCG capsule  2. How are you currently taking this medication (dosage and times per day)? Daily before breakfast   3. Are you having a reaction (difficulty breathing--STAT)? No   4. What is your medication issue? Can the mcg be increase,   [place], [place]

## 2020-04-16 NOTE — Telephone Encounter (Signed)
Sent note to Dr. Jenny Reichmann to be please advise pt.

## 2020-04-27 DIAGNOSIS — M25579 Pain in unspecified ankle and joints of unspecified foot: Secondary | ICD-10-CM | POA: Diagnosis not present

## 2020-04-27 DIAGNOSIS — M48061 Spinal stenosis, lumbar region without neurogenic claudication: Secondary | ICD-10-CM | POA: Diagnosis not present

## 2020-04-27 DIAGNOSIS — M542 Cervicalgia: Secondary | ICD-10-CM | POA: Diagnosis not present

## 2020-04-27 DIAGNOSIS — G894 Chronic pain syndrome: Secondary | ICD-10-CM | POA: Diagnosis not present

## 2020-04-27 DIAGNOSIS — K5909 Other constipation: Secondary | ICD-10-CM | POA: Diagnosis not present

## 2020-04-28 ENCOUNTER — Other Ambulatory Visit: Payer: Medicare Other

## 2020-04-28 ENCOUNTER — Other Ambulatory Visit: Payer: Self-pay

## 2020-04-28 DIAGNOSIS — C61 Malignant neoplasm of prostate: Secondary | ICD-10-CM

## 2020-04-29 LAB — PSA: Prostate Specific Ag, Serum: <0.1 ng/mL (ref 0.0–4.0)

## 2020-05-04 NOTE — Progress Notes (Signed)
05/05/20 11:01 AM   Albert Wade [birth year] NP:[id number]  Referring provider: Biagio Borg, MD [address] [place],  [place] [address] Chief Complaint  Patient presents with  . Prostate Cancer    HPI: Albert Wade is a 74 y.o. M with multiple GU issues returns today for a 6 month f/u for the evaluation and management of prostate cancer.  He has a personal history of high risk prostate cancer status post IMRT on ADT. PSA is undetectable. ADT through 04/2020.  He had undergone urodynamics in the past which showed engagement of his pelvic floor musculature during voiding consistent with pelvic floor dysfunction.  He also has some overactivity without significant outlet obstruction.  Please see previous notes for details.  Notably, he was found to have incidental right ureteral calculi and hydronephrosis on CT scan 09/2019. He failed to pass the spontaneously.  He was otherwise asymptomatic.  He also elected to undergo ureteroscopic intervention on 01/06/2020.  The stones were treated but he was found to have several centimeters severe right distal ureteral stricture with severe hydroureteronephrosis at the level of the transition point.  Several biopsies were taken, this was benign.  Interesting, urothelium was not present for evaluation and there are scattered fragments of fibromuscular stroma consistent with ureteral wall.  He reports of being bothered by his stent which was placed in January.   He continues to be very bothered by stent.  He bleeds with activity.  He has chronic right flank pain worse with voiding.  He initially elected conservative management and now is more interested in more definitive surgery and is considering reimplant.  He seen Dr. Arther Dames at [facility] urology for second opinion who is supportive of this idea.  SHx of back surgery (lumbar spine, anterior approach) and abdominal hernia surgery w/ mesh 3 years ago. S/p open appy.   PSA undetectable as of  04/28/20.   PMH: Past Medical History:  Diagnosis Date  . Acute bronchitis 01/01/2015  . Acute upper respiratory infection 05/19/2016  . Anxiety   . Arthritis    back,wrists,hx. previous fractures  . BPH (benign prostatic hypertrophy)   . BRONCHITIS, CHRONIC 04/29/2008   Qualifier: Diagnosis of  By: Halford Chessman MD, [place]    . CAD 10/16/2009   Qualifier: Diagnosis of  By: Lynden Ang    . CAD (coronary artery disease)    angioplasty   . Chronic back pain   . Chronic bronchitis   . 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 Confusion 04/27/2017  . Depression   . Encounter for preventative adult health care exam with abnormal findings 12/20/2013  . Fever 04/27/2017  . GERD (gastroesophageal reflux disease)   . Gynecomastia 05/12/2015  . H/O hiatal hernia    pts wife states pt does not have   . Heart murmur   . History of kidney stones   . Hypercholesteremia   . Hyperglycemia 05/19/2016  . HYPERLIPIDEMIA 04/29/2008   Qualifier: Diagnosis of  By: Jim Like    . Impaired glucose tolerance 12/20/2013  . Insomnia 04/27/2017  . Lumbosacral radiculopathy 10/14/2016  . MI (myocardial infarction) ([place])    mid-'90's  . MYOCARDIAL INFARCTION 04/29/2008   Qualifier: History of  By: Jim Like    . OSA (obstructive sleep apnea) 05/13/2011   Split night study 2011:  AHI 21/hr with obstructive and central events.  Placed on cpap and titrated to 13cm with reasonable control, but attempts to increase pressure increased the number of  central events.  Medium resmed quattro full face mask used.    . Peripheral neuropathy    peripheral neuropathy  . Phimosis/adherent prepuce 10/21/2013  . Pneumonia   . Prostate cancer ([place])   . Shortness of breath 11/05/2010   Qualifier: Diagnosis of  By: Marilynne Halsted, RN, BSN, Jacquelyn    . Sleep apnea    mild-no cpap, unable to tolerate  . Umbilical hernia   . Umbilical hernia without obstruction and without gangrene 05/19/2016  . URI (upper  respiratory infection) 04/27/2017  . UTI (urinary tract infection) 12/01/2016  . Varicose veins   . Varicose veins of bilateral lower extremities with other complications [id number]  . Wheezing 01/01/2015    Surgical History: Past Surgical History:  Procedure Laterality Date  . ANGIOPLASTY    . APPENDECTOMY    . BACK SURGERY     x3  . CARDIAC CATHETERIZATION     11'11  . CIRCUMCISION N/A 10/21/2013   Procedure: CIRCUMCISION ADULT;  Surgeon: Bernestine Amass, MD;  Location: [facility];  Service: Urology;  Laterality: N/A;  . CYSTOSCOPY N/A 10/21/2013   Procedure: CYSTOSCOPY FLEXIBLE;  Surgeon: Bernestine Amass, MD;  Location: [facility];  Service: Urology;  Laterality: N/A;  . CYSTOSCOPY WITH BIOPSY Right 01/06/2020   Procedure: CYSTOSCOPY WITH BIOPSY;  Surgeon: Hollice Espy, MD;  Location: [facility];  Service: Urology;  Laterality: Right;  . CYSTOSCOPY/URETEROSCOPY/HOLMIUM LASER/STENT PLACEMENT Right 01/06/2020   Procedure: CYSTOSCOPY/URETEROSCOPY/HOLMIUM LASER/STENT PLACEMENT;  Surgeon: Hollice Espy, MD;  Location: [facility];  Service: Urology;  Laterality: Right;  . INSERTION OF MESH N/A 06/17/2016   Procedure: INSERTION OF MESH;  Surgeon: Excell Seltzer, MD;  [facility]: [facility];  Service: General;  Laterality: N/A;  . UMBILICAL HERNIA REPAIR N/A 06/17/2016   Procedure: REPAIR UMBILICAL HERNIA WITH MESH;  Surgeon: Excell Seltzer, MD;  [facility]: [facility];  Service: General;  Laterality: N/A;  . WRIST SURGERY Right     Home Medications:  Allergies as of 05/05/2020      Reactions   Seroquel [quetiapine] Hives      Medication List       Accurate as of May 05, 2020 11:01 AM. If you have any questions, ask your nurse or doctor.        Albuterol Sulfate 108 (90 Base) MCG/ACT Aepb Commonly known as: PROAIR RESPICLICK Inhale 2 puffs into the lungs 4 (four) times daily as needed.   ALPRAZolam 0.5 MG tablet Commonly known as: XANAX Take 0.5 mg by mouth every evening.   aspirin 81 MG  tablet Take 81 mg by mouth daily.   cyclobenzaprine 10 MG tablet Commonly known as: FLEXERIL Take 10 mg by mouth 3 (three) times daily.   DULoxetine 60 MG capsule Commonly known as: CYMBALTA Take 120 mg by mouth daily.   furosemide 40 MG tablet Commonly known as: LASIX Take 40 mg by mouth daily as needed for edema.   linaclotide 145 MCG Caps capsule Commonly known as: Linzess Take 1 capsule (145 mcg total) by mouth daily before breakfast.   oxyCODONE-acetaminophen 10-325 MG tablet Commonly known as: PERCOCET Take 1 tablet by mouth every 4 (four) hours as needed for pain.   potassium chloride 10 MEQ tablet Commonly known as: KLOR-CON TAKE 2 TABLETS BY MOUTH PER DAY ON DAYS TAKING FUROSEMIDE   simvastatin 80 MG tablet Commonly known as: ZOCOR TAKE 1 TABLET BY MOUTH EVERY EVENING   solifenacin 10 MG tablet Commonly known as: VESICARE TAKE 1 TABLET BY MOUTH DAILY  tamsulosin 0.4 MG Caps capsule Commonly known as: FLOMAX Take 1 capsule (0.4 mg total) by mouth daily.   traZODone 50 MG tablet Commonly known as: DESYREL Take 1/2 to 1 tablet by mouth at bedtime as needed for sleep Annual appt due in [place] must see provider for future refills       Allergies:  Allergies  Allergen Reactions  . Seroquel [Quetiapine] Hives    Family History: Family History  Problem Relation Age of Onset  . Prostate cancer Father   . Emphysema Father   . Hyperlipidemia Father   . Hyperlipidemia Mother     Social History:  reports that he quit smoking about 24 years ago. His smoking use included cigarettes. He has a 20.00 pack-year smoking history. He has never used smokeless tobacco. He reports that he does not drink alcohol or use drugs.   Physical Exam: BP (!) 183/78   Pulse 60   Ht 5\' 10"  (1.778 m)   Wt 185 lb (83.9 kg)   BMI 26.54 kg/m   Constitutional:  Alert and oriented, No acute distress. HEENT: [place] AT, moist mucus membranes.  Trachea midline, no  masses. Cardiovascular: No clubbing, cyanosis, or edema. Respiratory: Normal respiratory effort, no increased work of breathing. Abd: Lower midline abdominal incision appreciated as well as periumbilical incision.  Right transverse abdominal incision consistent with appendicitis scar also appreciated. Skin: No rashes, bruises or suspicious lesions. Neurologic: Grossly intact, no focal deficits, moving all 4 extremities. Psychiatric: Normal mood and affect.  Laboratory Data:  Lab Results  Component Value Date   CREATININE 0.89 02/14/2020   Urinalysis UA-pending   Assessment & Plan:    1. Prostate Cancer  Personal history of high risk prostate cancer status post IMRT and ADT x3 years  PSA is undetectable  We will hold off on further ADT for the time being  We discussed the concept of PSA nadir we will continue to recheck his PSA and an approximately 3 times year basis for the time being  2. Right Ureteral stricture  Likely secondary to radiation, biopsy negative with no other obvious etiology  Per previous discussions, various options were discussed and initially he elected conservative management with a chronic indwelling stent.  He since reconsidered and now is interested in ureteral reimplant.  He seen a second opinion as above.   Discussed ureteral robotic re-implant surgery w/ needs for prolonged foley and stent. We reviewed the surgery in detail today including the preoperative, intraoperative, and postoperative course. Risk of bleeding, infection, damage surrounding structures, injury to the bladder/ urethral, ureteral stricture, were all discussed in detail. Discussed nature of refluxing and anastomosis.  He also understands given his history of multiple abdominal surgeries as well as pelvic radiation, if his pelvis is frozen or effusions are extremely severe, will abort the procedure which he understands.  He also understands there is a risk of anastomotic stricture or  procedure failure.  All questions were answered.  Pre-op UA/culture    [facility] [address] [place], [place] [address] [phone]  I, Lucas Mallow, am acting as a scribe for Dr. Hollice Espy,  I have reviewed the above documentation for accuracy and completeness, and I agree with the above.   Hollice Espy, MD  I spent 50 total minutes on the day of the encounter including pre-visit review of the medical record, face-to-face time with the patient, and post visit ordering of labs/imaging/tests.

## 2020-05-04 NOTE — H&P (View-Only) (Signed)
05/05/20 11:01 AM   Albert Wade 11-27-1946 NP:[id number]  Referring provider: Biagio Borg, MD [address] [place],  [place] [address] Chief Complaint  Patient presents with  . Prostate Cancer    HPI: Albert Wade is a 74 y.o. M with multiple GU issues returns today for a 6 month f/u for the evaluation and management of prostate cancer.  He has a personal history of high risk prostate cancer status post IMRT on ADT. PSA is undetectable. ADT through 04/2020.  He had undergone urodynamics in the past which showed engagement of his pelvic floor musculature during voiding consistent with pelvic floor dysfunction.  He also has some overactivity without significant outlet obstruction.  Please see previous notes for details.  Notably, he was found to have incidental right ureteral calculi and hydronephrosis on CT scan 09/2019. He failed to pass the spontaneously.  He was otherwise asymptomatic.  He also elected to undergo ureteroscopic intervention on 01/06/2020.  The stones were treated but he was found to have several centimeters severe right distal ureteral stricture with severe hydroureteronephrosis at the level of the transition point.  Several biopsies were taken, this was benign.  Interesting, urothelium was not present for evaluation and there are scattered fragments of fibromuscular stroma consistent with ureteral wall.  He reports of being bothered by his stent which was placed in January.   He continues to be very bothered by stent.  He bleeds with activity.  He has chronic right flank pain worse with voiding.  He initially elected conservative management and now is more interested in more definitive surgery and is considering reimplant.  He seen Dr. Arther Dames at [facility] urology for second opinion who is supportive of this idea.  SHx of back surgery (lumbar spine, anterior approach) and abdominal hernia surgery w/ mesh 3 years ago. S/p open appy.   PSA undetectable as of  04/28/20.   PMH: Past Medical History:  Diagnosis Date  . Acute bronchitis 01/01/2015  . Acute upper respiratory infection 05/19/2016  . Anxiety   . Arthritis    back,wrists,hx. previous fractures  . BPH (benign prostatic hypertrophy)   . BRONCHITIS, CHRONIC 04/29/2008   Qualifier: Diagnosis of  By: Halford Chessman MD, Vine[place]    . CAD 10/16/2009   Qualifier: Diagnosis of  By: Lynden Ang    . CAD (coronary artery disease)    angioplasty   . Chronic back pain   . Chronic bronchitis   . 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 Confusion 04/27/2017  . Depression   . Encounter for preventative adult health care exam with abnormal findings 12/20/2013  . Fever 04/27/2017  . GERD (gastroesophageal reflux disease)   . Gynecomastia 05/12/2015  . H/O hiatal hernia    pts wife states pt does not have   . Heart murmur   . History of kidney stones   . Hypercholesteremia   . Hyperglycemia 05/19/2016  . HYPERLIPIDEMIA 04/29/2008   Qualifier: Diagnosis of  By: Jim Like    . Impaired glucose tolerance 12/20/2013  . Insomnia 04/27/2017  . Lumbosacral radiculopathy 10/14/2016  . MI (myocardial infarction) ([place])    mid-'90's  . MYOCARDIAL INFARCTION 04/29/2008   Qualifier: History of  By: Jim Like    . OSA (obstructive sleep apnea) 05/13/2011   Split night study 2011:  AHI 21/hr with obstructive and central events.  Placed on cpap and titrated to 13cm with reasonable control, but attempts to increase pressure increased the number of  central events.  Medium resmed quattro full face mask used.    . Peripheral neuropathy    peripheral neuropathy  . Phimosis/adherent prepuce 10/21/2013  . Pneumonia   . Prostate cancer ([place])   . Shortness of breath 11/05/2010   Qualifier: Diagnosis of  By: Marilynne Halsted, RN, BSN, Jacquelyn    . Sleep apnea    mild-no cpap, unable to tolerate  . Umbilical hernia   . Umbilical hernia without obstruction and without gangrene 05/19/2016  . URI (upper  respiratory infection) 04/27/2017  . UTI (urinary tract infection) 12/01/2016  . Varicose veins   . Varicose veins of bilateral lower extremities with other complications [id number]  . Wheezing 01/01/2015    Surgical History: Past Surgical History:  Procedure Laterality Date  . ANGIOPLASTY    . APPENDECTOMY    . BACK SURGERY     x3  . CARDIAC CATHETERIZATION     11'11  . CIRCUMCISION N/A 10/21/2013   Procedure: CIRCUMCISION ADULT;  Surgeon: Bernestine Amass, MD;  [facility];  Service: Urology;  Laterality: N/A;  . CYSTOSCOPY N/A 10/21/2013   Procedure: CYSTOSCOPY FLEXIBLE;  Surgeon: Bernestine Amass, MD;  Location: [facility];  Service: Urology;  Laterality: N/A;  . CYSTOSCOPY WITH BIOPSY Right 01/06/2020   Procedure: CYSTOSCOPY WITH BIOPSY;  Surgeon: Hollice Espy, MD;  Location: [facility];  Service: Urology;  Laterality: Right;  . CYSTOSCOPY/URETEROSCOPY/HOLMIUM LASER/STENT PLACEMENT Right 01/06/2020   Procedure: CYSTOSCOPY/URETEROSCOPY/HOLMIUM LASER/STENT PLACEMENT;  Surgeon: Hollice Espy, MD;  Location: [facility];  Service: Urology;  Laterality: Right;  . INSERTION OF MESH N/A 06/17/2016   Procedure: INSERTION OF MESH;  Surgeon: Excell Seltzer, MD;  [facility];  Service: General;  Laterality: N/A;  . UMBILICAL HERNIA REPAIR N/A 06/17/2016   Procedure: REPAIR UMBILICAL HERNIA WITH MESH;  Surgeon: Excell Seltzer, MD;  Location[facility];  Service: General;  Laterality: N/A;  . WRIST SURGERY Right     Home Medications:  Allergies as of 05/05/2020      Reactions   Seroquel [quetiapine] Hives      Medication List       Accurate as of May 05, 2020 11:01 AM. If you have any questions, ask your nurse or doctor.        Albuterol Sulfate 108 (90 Base) MCG/ACT Aepb Commonly known as: PROAIR RESPICLICK Inhale 2 puffs into the lungs 4 (four) times daily as needed.   ALPRAZolam 0.5 MG tablet Commonly known as: XANAX Take 0.5 mg by mouth every evening.   aspirin 81 MG  tablet Take 81 mg by mouth daily.   cyclobenzaprine 10 MG tablet Commonly known as: FLEXERIL Take 10 mg by mouth 3 (three) times daily.   DULoxetine 60 MG capsule Commonly known as: CYMBALTA Take 120 mg by mouth daily.   furosemide 40 MG tablet Commonly known as: LASIX Take 40 mg by mouth daily as needed for edema.   linaclotide 145 MCG Caps capsule Commonly known as: Linzess Take 1 capsule (145 mcg total) by mouth daily before breakfast.   oxyCODONE-acetaminophen 10-325 MG tablet Commonly known as: PERCOCET Take 1 tablet by mouth every 4 (four) hours as needed for pain.   potassium chloride 10 MEQ tablet Commonly known as: KLOR-CON TAKE 2 TABLETS BY MOUTH PER DAY ON DAYS TAKING FUROSEMIDE   simvastatin 80 MG tablet Commonly known as: ZOCOR TAKE 1 TABLET BY MOUTH EVERY EVENING   solifenacin 10 MG tablet Commonly known as: VESICARE TAKE 1 TABLET BY MOUTH DAILY  tamsulosin 0.4 MG Caps capsule Commonly known as: FLOMAX Take 1 capsule (0.4 mg total) by mouth daily.   traZODone 50 MG tablet Commonly known as: DESYREL Take 1/2 to 1 tablet by mouth at bedtime as needed for sleep Annual appt due in [place] must see provider for future refills       Allergies:  Allergies  Allergen Reactions  . Seroquel [Quetiapine] Hives    Family History: Family History  Problem Relation Age of Onset  . Prostate cancer Father   . Emphysema Father   . Hyperlipidemia Father   . Hyperlipidemia Mother     Social History:  reports that he quit smoking about 24 years ago. His smoking use included cigarettes. He has a 20.00 pack-year smoking history. He has never used smokeless tobacco. He reports that he does not drink alcohol or use drugs.   Physical Exam: BP (!) 183/78   Pulse 60   Ht 5\' 10"  (1.778 m)   Wt 185 lb (83.9 kg)   BMI 26.54 kg/m   Constitutional:  Alert and oriented, No acute distress. HEENT: [place] AT, moist mucus membranes.  Trachea midline, no  masses. Cardiovascular: No clubbing, cyanosis, or edema. Respiratory: Normal respiratory effort, no increased work of breathing. Abd: Lower midline abdominal incision appreciated as well as periumbilical incision.  Right transverse abdominal incision consistent with appendicitis scar also appreciated. Skin: No rashes, bruises or suspicious lesions. Neurologic: Grossly intact, no focal deficits, moving all 4 extremities. Psychiatric: Normal mood and affect.  Laboratory Data:  Lab Results  Component Value Date   CREATININE 0.89 02/14/2020   Urinalysis UA-pending   Assessment & Plan:    1. Prostate Cancer  Personal history of high risk prostate cancer status post IMRT and ADT x3 years  PSA is undetectable  We will hold off on further ADT for the time being  We discussed the concept of PSA nadir we will continue to recheck his PSA and an approximately 3 times year basis for the time being  2. Right Ureteral stricture  Likely secondary to radiation, biopsy negative with no other obvious etiology  Per previous discussions, various options were discussed and initially he elected conservative management with a chronic indwelling stent.  He since reconsidered and now is interested in ureteral reimplant.  He seen a second opinion as above.   Discussed ureteral robotic re-implant surgery w/ needs for prolonged foley and stent. We reviewed the surgery in detail today including the preoperative, intraoperative, and postoperative course. Risk of bleeding, infection, damage surrounding structures, injury to the bladder/ urethral, ureteral stricture, were all discussed in detail. Discussed nature of refluxing and anastomosis.  He also understands given his history of multiple abdominal surgeries as well as pelvic radiation, if his pelvis is frozen or effusions are extremely severe, will abort the procedure which he understands.  He also understands there is a risk of anastomotic stricture or  procedure failure.  All questions were answered.  Pre-op UA/culture    [facility] [address] [place], [place] [address] [phone]  I, Lucas Mallow, am acting as a scribe for Dr. Hollice Espy,  I have reviewed the above documentation for accuracy and completeness, and I agree with the above.   Hollice Espy, MD  I spent 50 total minutes on the day of the encounter including pre-visit review of the medical record, face-to-face time with the patient, and post visit ordering of labs/imaging/tests.

## 2020-05-05 ENCOUNTER — Ambulatory Visit: Payer: Medicare Other | Admitting: Urology

## 2020-05-05 ENCOUNTER — Other Ambulatory Visit: Payer: Self-pay

## 2020-05-05 ENCOUNTER — Other Ambulatory Visit: Payer: Self-pay | Admitting: Radiology

## 2020-05-05 VITALS — BP 183/78 | HR 60 | Ht 70.0 in | Wt 185.0 lb

## 2020-05-05 DIAGNOSIS — N135 Crossing vessel and stricture of ureter without hydronephrosis: Secondary | ICD-10-CM

## 2020-05-05 DIAGNOSIS — N3941 Urge incontinence: Secondary | ICD-10-CM

## 2020-05-06 ENCOUNTER — Encounter: Payer: Self-pay | Admitting: Urology

## 2020-05-06 ENCOUNTER — Other Ambulatory Visit: Payer: Self-pay | Admitting: Radiology

## 2020-05-06 ENCOUNTER — Telehealth: Payer: Self-pay | Admitting: *Deleted

## 2020-05-06 LAB — URINALYSIS, COMPLETE
Bilirubin, UA: NEGATIVE
Glucose, UA: NEGATIVE
Ketones, UA: NEGATIVE
Nitrite, UA: NEGATIVE
Specific Gravity, UA: 1.025 (ref 1.005–1.030)
Urobilinogen, Ur: 1 mg/dL (ref 0.2–1.0)
pH, UA: 7 (ref 5.0–7.5)

## 2020-05-06 LAB — MICROSCOPIC EXAMINATION
Bacteria, UA: NONE SEEN
RBC, Urine: >30 /HPF — AB (ref 0–2)

## 2020-05-06 NOTE — Telephone Encounter (Signed)
   Primary Cardiologist: Dorris Carnes, MD  Chart reviewed and patient contacted by phone today as part of pre-operative protocol coverage. Given past medical history and time since last visit, based on ACC/AHA guidelines, Albert Wade would be at acceptable risk for the planned procedure without further cardiovascular testing.   We usually recommend holding aspirin no more than 5 days pre op but in this patient's case (mild CAD) will defer to the operating surgeon.   I will route this recommendation to the requesting party via Epic fax function and remove from pre-op pool.  Please call with questions.  Kerin Ransom, PA-C 05/06/2020, 4:44 PM

## 2020-05-06 NOTE — Telephone Encounter (Signed)
   [facility] Pre-operative Risk Assessment    Request for surgical clearance:  1. What type of surgery is being performed? ROBOTIC RIGHT URETERAL REIMPLANT, POSSIBLE BOARI FLAP/PSOAS HITCH, CYSTOSCOPY, RIGHT RETROGRADE PYELOGRAM, URETERAL STENT EXCHANGE, ICG INJECTION   2. When is this surgery scheduled? 06/01/20   3. What type of clearance is required (medical clearance vs. Pharmacy clearance to hold med vs. Both)? MEDICAL  4. Are there any medications that need to be held prior to surgery and how long? ASA X 7 DAYS PRIOR TO SURGERY   5. Practice name and name of physician performing surgery? [facility]; DR. Hollice Espy   6. What is your office phone number [phone]    7.   What is your office fax number [phone] ATTN: [email or web address]   8.   Anesthesia type (None, local, MAC, general) ? GENERAL   Julaine Hua 05/06/2020, 3:34 PM  _________________________________________________________________   (provider comments below)

## 2020-05-11 LAB — CULTURE, URINE COMPREHENSIVE

## 2020-05-20 ENCOUNTER — Ambulatory Visit (INDEPENDENT_AMBULATORY_CARE_PROVIDER_SITE_OTHER): Payer: Medicare Other

## 2020-05-20 DIAGNOSIS — Z0000 Encounter for general adult medical examination without abnormal findings: Secondary | ICD-10-CM

## 2020-05-20 NOTE — Progress Notes (Signed)
I connected with Lucienne Minks today by telephone and verified that I am speaking with the correct person using two identifiers. Location patient: home Location provider: work Persons participating in the virtual visit: Benjaman Azoulay, Carlie Franchina (wife) and Lisette Abu, LPN.   I discussed the limitations, risks, security and privacy concerns of performing an evaluation and management service by telephone and the availability of in person appointments. I also discussed with the patient that there may be a patient responsible charge related to this service. The patient expressed understanding and verbally consented to this telephonic visit.    Interactive audio and video telecommunications were attempted between this provider and patient, however failed, due to patient having technical difficulties OR patient did not have access to video capability.  We continued and completed visit with audio only.  Some vital signs may be absent or patient reported.   Time Spent with patient on telephone encounter: 20 minutes  Subjective:   Albert Wade is a 74 y.o. male who presents for Medicare Annual/Subsequent preventive examination.  Review of Systems:  No ROS. Medicare Wellness Visit Cardiac Risk Factors include: advanced age (>91men, >37 women);dyslipidemia;hypertension;male gender     Objective:    Vitals: There were no vitals taken for this visit.  There is no height or weight on file to calculate BMI.  Advanced Directives 05/20/2020 12/23/2019 05/09/2019 04/05/2018 09/28/2017 12/29/2016 12/01/2016  Does Patient Have a Medical Advance Directive? No No No No No No No  Copy of Healthcare Power of Attorney in Chart? - - - - - - -  Would patient like information on creating a medical advance directive? No - Patient declined No - Patient declined No - Patient declined No - Patient declined - No - Patient declined No - Patient declined  Pre-existing out of facility DNR order (yellow form  or pink MOST form) - - - - - - -    Tobacco Social History   Tobacco Use  Smoking Status Former Smoker  . Packs/day: 0.50  . Years: 40.00  . Pack years: 20.00  . Types: Cigarettes  . Quit date: 12/27/1995  . Years since quitting: 24.4  Smokeless Tobacco Never Used     Counseling given: Not Answered   Clinical Intake:  Pre-visit preparation completed: Yes  Pain : 0-10 Pain Score: 8  Pain Type: Chronic pain Pain Location: Neck Pain Radiating Towards: back Pain Descriptors / Indicators: Aching, Constant, Discomfort Pain Onset: More than a month ago Pain Frequency: Constant Pain Relieving Factors: Pain Medication  Pain Relieving Factors: Pain Medication  Nutritional Risks: None Diabetes: No  How often do you need to have someone help you when you read instructions, pamphlets, or other written materials from your doctor or pharmacy?: 1 - Never What is the last grade level you completed in school?: GED  Interpreter Needed?: No  Information entered by :: Shanna Strength N. Lowell Guitar, LPN  Past Medical History:  Diagnosis Date  . Acute bronchitis 01/01/2015  . Acute upper respiratory infection 05/19/2016  . Anxiety   . Arthritis    back,wrists,hx. previous fractures  . BPH (benign prostatic hypertrophy)   . BRONCHITIS, CHRONIC 04/29/2008   Qualifier: Diagnosis of  By: Halford Chessman MD, Vine[place]    . CAD 10/16/2009   Qualifier: Diagnosis of  By: Lynden Ang    . CAD (coronary artery disease)    angioplasty   . Chronic back pain   . Chronic bronchitis   . Chronic low back pain with sciatica  10/14/2016  . Confusion 04/27/2017  . Depression   . Encounter for preventative adult health care exam with abnormal findings 12/20/2013  . Fever 04/27/2017  . GERD (gastroesophageal reflux disease)   . Gynecomastia 05/12/2015  . H/O hiatal hernia    pts wife states pt does not have   . Heart murmur   . History of kidney stones   . Hypercholesteremia   . Hyperglycemia 05/19/2016  .  HYPERLIPIDEMIA 04/29/2008   Qualifier: Diagnosis of  By: Jim Like    . Impaired glucose tolerance 12/20/2013  . Insomnia 04/27/2017  . Lumbosacral radiculopathy 10/14/2016  . MI (myocardial infarction) ([place])    mid-'90's  . MYOCARDIAL INFARCTION 04/29/2008   Qualifier: History of  By: Jim Like    . OSA (obstructive sleep apnea) 05/13/2011   Split night study 2011:  AHI 21/hr with obstructive and central events.  Placed on cpap and titrated to 13cm with reasonable control, but attempts to increase pressure increased the number of central events.  Medium resmed quattro full face mask used.    . Peripheral neuropathy    peripheral neuropathy  . Phimosis/adherent prepuce 10/21/2013  . Pneumonia   . Prostate cancer ([place])   . Shortness of breath 11/05/2010   Qualifier: Diagnosis of  By: Marilynne Halsted, RN, BSN, Jacquelyn    . Sleep apnea    mild-no cpap, unable to tolerate  . Umbilical hernia   . Umbilical hernia without obstruction and without gangrene 05/19/2016  . URI (upper respiratory infection) 04/27/2017  . UTI (urinary tract infection) 12/01/2016  . Varicose veins   . Varicose veins of bilateral lower extremities with other complications [id number]  . Wheezing 01/01/2015   Past Surgical History:  Procedure Laterality Date  . ANGIOPLASTY    . APPENDECTOMY    . BACK SURGERY     x3  . CARDIAC CATHETERIZATION     11'11  . CIRCUMCISION N/A 10/21/2013   Procedure: CIRCUMCISION ADULT;  Surgeon: Bernestine Amass, MD;  Location: [facility];  Service: Urology;  Laterality: N/A;  . CYSTOSCOPY N/A 10/21/2013   Procedure: CYSTOSCOPY FLEXIBLE;  Surgeon: Bernestine Amass, MD;  Location: [facility];  Service: Urology;  Laterality: N/A;  . CYSTOSCOPY WITH BIOPSY Right 01/06/2020   Procedure: CYSTOSCOPY WITH BIOPSY;  Surgeon: Hollice Espy, MD;  Location: [facility];  Service: Urology;  Laterality: Right;  . CYSTOSCOPY/URETEROSCOPY/HOLMIUM LASER/STENT PLACEMENT Right 01/06/2020   Procedure:  CYSTOSCOPY/URETEROSCOPY/HOLMIUM LASER/STENT PLACEMENT;  Surgeon: Hollice Espy, MD;  Location: [facility];  Service: Urology;  Laterality: Right;  . INSERTION OF MESH N/A 06/17/2016   Procedure: INSERTION OF MESH;  Surgeon: Excell Seltzer, MD;  Location: [facility];  Service: General;  Laterality: N/A;  . UMBILICAL HERNIA REPAIR N/A 06/17/2016   Procedure: REPAIR UMBILICAL HERNIA WITH MESH;  Surgeon: Excell Seltzer, MD;  Location: [facility];  Service: General;  Laterality: N/A;  . WRIST SURGERY Right    Family History  Problem Relation Age of Onset  . Prostate cancer Father   . Emphysema Father   . Hyperlipidemia Father   . Hyperlipidemia Mother    Social History   Socioeconomic History  . Marital status: Married    Spouse name: Not on file  . Number of children: Not on file  . Years of education: Not on file  . Highest education level: Not on file  Occupational History  . Occupation: retired Nurse, mental health  . Smoking status: Former Smoker    Packs/day: 0.50  Years: 40.00    Pack years: 20.00    Types: Cigarettes    Quit date: 12/27/1995    Years since quitting: 24.4  . Smokeless tobacco: Never Used  Substance and Sexual Activity  . Alcohol use: No  . Drug use: No  . Sexual activity: Yes  Other Topics Concern  . Not on file  Social History Narrative  . Not on file   Social Determinants of Health   Financial Resource Strain:   . Difficulty of Paying Living Expenses:   Food Insecurity:   . Worried About Charity fundraiser in the Last Year:   . Arboriculturist in the Last Year:   Transportation Needs:   . Film/video editor (Medical):   Marland Kitchen Lack of Transportation (Non-Medical):   Physical Activity:   . Days of Exercise per Week:   . Minutes of Exercise per Session:   Stress:   . Feeling of Stress :   Social Connections:   . Frequency of Communication with Friends and Family:   . Frequency of Social Gatherings with Friends and Family:   . Attends  Religious Services:   . Active Member of Clubs or Organizations:   . Attends Archivist Meetings:   Marland Kitchen Marital Status:     Outpatient Encounter Medications as of 05/20/2020  Medication Sig  . Albuterol Sulfate 108 (90 Base) MCG/ACT AEPB Inhale 2 puffs into the lungs 4 (four) times daily as needed. (Patient taking differently: Inhale 2 puffs into the lungs 4 (four) times daily as needed (shortness of breath). )  . ALPRAZolam (XANAX) 0.5 MG tablet Take 0.5 mg by mouth daily as needed for anxiety.   Marland Kitchen aspirin 81 MG tablet Take 81 mg by mouth daily.  . DULoxetine (CYMBALTA) 60 MG capsule Take 60 mg by mouth 2 (two) times daily.   . furosemide (LASIX) 40 MG tablet Take 40 mg by mouth daily as needed for edema.   Marland Kitchen linaclotide (LINZESS) 145 MCG CAPS capsule Take 1 capsule (145 mcg total) by mouth daily before breakfast.  . oxyCODONE-acetaminophen (PERCOCET) 10-325 MG tablet Take 1 tablet by mouth every 4 (four) hours as needed for pain. (Patient taking differently: Take 1-2 tablets by mouth every 4 (four) hours as needed for pain. Max 8 per day)  . polyethylene glycol (MIRALAX / GLYCOLAX) 17 g packet Take 17 g by mouth daily as needed for moderate constipation.  . potassium chloride (K-DUR) 10 MEQ tablet TAKE 2 TABLETS BY MOUTH PER DAY ON DAYS TAKING FUROSEMIDE (Patient not taking: Reported on 05/11/2020)  . simvastatin (ZOCOR) 80 MG tablet TAKE 1 TABLET BY MOUTH EVERY EVENING (Patient taking differently: Take 80 mg by mouth every evening. )  . solifenacin (VESICARE) 10 MG tablet TAKE 1 TABLET BY MOUTH DAILY (Patient taking differently: Take 10 mg by mouth daily. )  . tamsulosin (FLOMAX) 0.4 MG CAPS capsule Take 1 capsule (0.4 mg total) by mouth daily.  . traZODone (DESYREL) 50 MG tablet Take 1/2 to 1 tablet by mouth at bedtime as needed for sleep Annual appt due in JUNE must see provider for future refills (Patient taking differently: Take 25-50 mg by mouth at bedtime as needed for sleep. )    No facility-administered encounter medications on file as of 05/20/2020.    Activities of Daily Living In your present state of health, do you have any difficulty performing the following activities: 05/20/2020 12/23/2019  Hearing? N -  Vision? N -  Difficulty concentrating  or making decisions? Y -  Comment sometimes -  Walking or climbing stairs? Y N  Comment uses a cane -  Dressing or bathing? N -  Doing errands, shopping? N N  Preparing Food and eating ? N -  Using the Toilet? N -  In the past six months, have you accidently leaked urine? Y -  Do you have problems with loss of bowel control? N -  Managing your Medications? N -  Managing your Finances? N -  Housekeeping or managing your Housekeeping? N -  Some recent data might be hidden    Patient Care Team: Biagio Borg, MD as PCP - General (Internal Medicine) Fay Records, MD as PCP - Cardiology (Cardiology) Guadalupe Maple, MD (Unknown Physician Specialty) Hollice Espy, MD as Consulting Physician (Urology)   Assessment:   This is a routine wellness examination for Albert Wade.  Exercise Activities and Dietary recommendations Current Exercise Habits: The patient does not participate in regular exercise at present, Exercise limited by: respiratory conditions(s);neurologic condition(s);orthopedic condition(s)  Goals    . Patient Stated (pt-stated)     Try to stay alive       Fall Risk Fall Risk  05/20/2020 02/07/2018 05/11/2017 12/29/2016 05/12/2015  Falls in the past year? 0 No No No No  Number falls in past yr: 0 - - - -  Injury with Fall? 0 - - - -  Risk for fall due to : Impaired balance/gait - - - -  Follow up Falls evaluation completed - - - -   Is the patient's home free of loose throw rugs in walkways, pet beds, electrical cords, etc?   yes      Grab bars in the bathroom? yes      Handrails on the stairs?   yes      Adequate lighting?   yes  Timed Get Up and Go Performed: not indicated  Depression  Screen PHQ 2/9 Scores 05/20/2020 02/07/2018 05/11/2017 12/29/2016  PHQ - 2 Score 0 1 0 0    Cognitive Function: not indicated        Immunization History  Administered Date(s) Administered  . Influenza, High Dose Seasonal PF 09/18/2018, 09/05/2019  . Influenza, Seasonal, Injecte, Preservative Fre 08/26/2013  . Influenza,inj,Quad PF,6+ Mos 09/21/2016  . Influenza,inj,quad, With Preservative 10/23/2017  . Influenza-Unspecified 10/12/2014, 09/18/2018  . PFIZER SARS-COV-2 Vaccination 02/16/2020, 03/10/2020  . Pneumococcal Conjugate-13 12/20/2013  . Pneumococcal Polysaccharide-23 05/12/2015  . Td 12/26/2008    Qualifies for Shingles Vaccine? completed  Screening Tests Health Maintenance  Topic Date Due  . TETANUS/TDAP  06/16/2020 (Originally 12/26/2018)  . INFLUENZA VACCINE  07/26/2020  . COLONOSCOPY  04/22/2021  . COVID-19 Vaccine  Completed  . Hepatitis C Screening  Completed  . PNA vac Low Risk Adult  Completed   Cancer Screenings: Lung: Low Dose CT Chest recommended if Age 78-80 years, 30 pack-year currently smoking OR have quit w/in 15years. Patient does qualify. Colorectal: No repeat due to age  Additional Screenings: Hepatitis C Screening: completed on 05/19/2016      Plan:     Reviewed health maintenance screenings with patient today and relevant education, vaccines, and/or referrals were provided.    Continue doing brain stimulating activities (puzzles, reading, adult coloring books, staying active) to keep memory sharp.    Continue to eat heart healthy diet (full of fruits, vegetables, whole grains, lean protein, water--limit salt, fat, and sugar intake) and increase physical activity as tolerated.  I have personally reviewed and  noted the following in the patient's chart:   . Medical and social history . Use of alcohol, tobacco or illicit drugs  . Current medications and supplements . Functional ability and status . Nutritional status . Physical  activity . Advanced directives . List of other physicians . Hospitalizations, surgeries, and ER visits in previous 12 months . Vitals . Screenings to include cognitive, depression, and falls . Referrals and appointments  In addition, I have reviewed and discussed with patient certain preventive protocols, quality metrics, and best practice recommendations. A written personalized care plan for preventive services as well as general preventive health recommendations were provided to patient.     Sheral Flow, LPN  [id number] Nurse Health Advisor  Nurse Note: There were no vitals filed for this visit. There is no height or weight on file to calculate BMI.

## 2020-05-21 ENCOUNTER — Encounter
Admission: RE | Admit: 2020-05-21 | Discharge: 2020-05-21 | Disposition: A | Discharge status: Home / Self Care | Payer: Medicare Other | Source: Ambulatory Visit | Attending: Urology | Admitting: Urology

## 2020-05-21 ENCOUNTER — Other Ambulatory Visit: Payer: Self-pay

## 2020-05-21 DIAGNOSIS — N135 Crossing vessel and stricture of ureter without hydronephrosis: Secondary | ICD-10-CM

## 2020-05-21 NOTE — Patient Instructions (Addendum)
COVID TESTING Date: Thursday, June 3 Testing site:  [facility] [address] Thru Hours:  [id number] am - 1:00 pm Once you are tested, you are asked to stay quarantined (avoiding public places) until after your surgery.   Your procedure is scheduled on: Monday, June 7 Report to Day Surgery on the 2nd floor of the [facility]. To find out your arrival time, please call [phone] between 1PM - 3PM on: Friday, June 4  REMEMBER: Instructions that are not followed completely may result in serious medical risk, up to and including death; or upon the discretion of your surgeon and anesthesiologist your surgery may need to be rescheduled.  Do not eat food after midnight the night before surgery.  No gum chewing, lozengers or hard candies.  You may however, drink CLEAR liquids up to 2 hours before you are scheduled to arrive for your surgery. Do not drink anything within 2 hours of your scheduled arrival time.  Clear liquids include: - water  - apple juice without pulp - gatorade (not RED) - black coffee or tea (Do NOT add milk or creamers to the coffee or tea) Do NOT drink anything that is not on this list.  TAKE THESE MEDICATIONS THE MORNING OF SURGERY WITH A SIP OF WATER:  1.  Albuterol inhaler 2.  Duloxetine 3.  Oxycodone if needed for pain  Use inhalers on the day of surgery and bring to the hospital.  Follow recommendations from Cardiologist or surgeon regarding stopping Aspirin.  1 WEEK PRIOR TO SURGERY: Stop Anti-inflammatories (NSAIDS) such as Advil, Aleve, Ibuprofen, Motrin, Naproxen, Naprosyn and Aspirin based products such as Excedrin, Goodys Powder, BC Powder. (May take Tylenol or Acetaminophen if needed.)  Stop ANY OVER THE COUNTER supplements until after surgery.  No Alcohol for 24 hours before or after surgery.  On the morning of surgery brush your teeth with toothpaste and water, you may rinse your mouth with mouthwash if you  wish. Do not swallow any toothpaste or mouthwash.  Do not wear jewelry, make-up, hairpins, clips or nail polish.  Do not wear lotions, powders, or perfumes.   Do not shave 48 hours prior to surgery.   Contact lenses, hearing aids and dentures may not be worn into surgery.  Do not bring valuables to the hospital. [facility] is not responsible for any missing/lost belongings or valuables.   Use CHG Soap as directed on instruction sheet.  Notify your doctor if there is any change in your medical condition (cold, fever, infection).  Wear comfortable clothing (specific to your surgery type) to the hospital.  Plan for stool softeners for home use; pain medications have a tendency to cause constipation. You can also help prevent constipation by eating foods high in fiber such as fruits and vegetables and drinking plenty of fluids as your diet allows.  After surgery, you can help prevent lung complications by doing breathing exercises.  Take deep breaths and cough every 1-2 hours. Your doctor may order a device called an Incentive Spirometer to help you take deep breaths. When coughing or sneezing, hold a pillow firmly against your incision with both hands. This is called "splinting." Doing this helps protect your incision. It also decreases belly discomfort.  If you are being admitted to the hospital overnight, leave your suitcase in the car. After surgery it may be brought to your room.  If you are being discharged the day of surgery, you will not be allowed to drive home. You  will need a responsible adult (18 years or older) to drive you home and stay with you that night.   If you are taking public transportation, you will need to have a responsible adult (18 years or older) with you. Please confirm with your physician that it is acceptable to use public transportation.   Please call the [facility] at [phone] if you have any questions about these  instructions.  Visitation Policy:  Patients undergoing a surgery or procedure may have one family member or support person with them as long as that person is not COVID-19 positive or experiencing its symptoms.  That person may remain in the waiting area during the procedure.  Inpatient Visitation Update:  Two designated support people may visit a patient during visiting hours 7 am to 8 pm. It must be the same two designated people for the duration of the patient stay. The visitors may come and go during the day, and there is no switching out to have different visitors. A mask must be worn at all times, including in the patient room.

## 2020-05-22 ENCOUNTER — Encounter
Admission: RE | Admit: 2020-05-22 | Discharge: 2020-05-22 | Disposition: A | Discharge status: Home / Self Care | Payer: Medicare Other | Source: Ambulatory Visit | Attending: Urology | Admitting: Urology

## 2020-05-22 ENCOUNTER — Other Ambulatory Visit: Payer: Medicare Other

## 2020-05-22 DIAGNOSIS — I2510 Atherosclerotic heart disease of native coronary artery without angina pectoris: Secondary | ICD-10-CM | POA: Diagnosis not present

## 2020-05-22 DIAGNOSIS — Z01818 Encounter for other preprocedural examination: Secondary | ICD-10-CM | POA: Insufficient documentation

## 2020-05-22 DIAGNOSIS — R54 Age-related physical debility: Secondary | ICD-10-CM | POA: Diagnosis not present

## 2020-05-22 DIAGNOSIS — N135 Crossing vessel and stricture of ureter without hydronephrosis: Secondary | ICD-10-CM

## 2020-05-22 LAB — BASIC METABOLIC PANEL WITH GFR
Anion gap: 8 (ref 5–15)
BUN: 15 mg/dL (ref 8–23)
CO2: 30 mmol/L (ref 22–32)
Calcium: 9.1 mg/dL (ref 8.9–10.3)
Chloride: 102 mmol/L (ref 98–111)
Creatinine, Ser: 0.96 mg/dL (ref 0.61–1.24)
GFR calc Af Amer: >60 mL/min
GFR calc non Af Amer: >60 mL/min
Glucose, Bld: 96 mg/dL (ref 70–99)
Potassium: 4.2 mmol/L (ref 3.5–5.1)
Sodium: 140 mmol/L (ref 135–145)

## 2020-05-22 LAB — URINALYSIS, COMPLETE
Bilirubin, UA: NEGATIVE
Glucose, UA: NEGATIVE
Ketones, UA: NEGATIVE
Nitrite, UA: NEGATIVE
Specific Gravity, UA: 1.025 (ref 1.005–1.030)
Urobilinogen, Ur: 0.2 mg/dL (ref 0.2–1.0)
pH, UA: 5.5 (ref 5.0–7.5)

## 2020-05-22 LAB — MICROSCOPIC EXAMINATION: RBC, Urine: >30 /HPF — AB (ref 0–2)

## 2020-05-22 LAB — CBC
HCT: 32.6 % — ABNORMAL LOW (ref 39.0–52.0)
Hemoglobin: 10.9 g/dL — ABNORMAL LOW (ref 13.0–17.0)
MCH: 29.6 pg (ref 26.0–34.0)
MCHC: 33.4 g/dL (ref 30.0–36.0)
MCV: 88.6 fL (ref 80.0–100.0)
Platelets: 145 10*3/uL — ABNORMAL LOW (ref 150–400)
RBC: 3.68 MIL/uL — ABNORMAL LOW (ref 4.22–5.81)
RDW: 12.1 % (ref 11.5–15.5)
WBC: 4 10*3/uL (ref 4.0–10.5)
nRBC: 0 % (ref 0.0–0.2)

## 2020-05-28 ENCOUNTER — Other Ambulatory Visit: Payer: Self-pay

## 2020-05-28 ENCOUNTER — Telehealth: Payer: Self-pay | Admitting: Physician Assistant

## 2020-05-28 ENCOUNTER — Other Ambulatory Visit
Admission: RE | Admit: 2020-05-28 | Discharge: 2020-05-28 | Disposition: A | Discharge status: Home / Self Care | Payer: Medicare Other | Source: Ambulatory Visit | Attending: Urology | Admitting: Urology

## 2020-05-28 DIAGNOSIS — Z20822 Contact with and (suspected) exposure to covid-19: Secondary | ICD-10-CM | POA: Diagnosis not present

## 2020-05-28 DIAGNOSIS — Z01812 Encounter for preprocedural laboratory examination: Secondary | ICD-10-CM | POA: Diagnosis present

## 2020-05-28 LAB — CULTURE, URINE COMPREHENSIVE

## 2020-05-28 LAB — SARS CORONAVIRUS 2 (TAT 6-24 HRS): SARS Coronavirus 2: NEGATIVE

## 2020-05-28 MED ORDER — SULFAMETHOXAZOLE-TRIMETHOPRIM 800-160 MG PO TABS: 1.0000 | ORAL_TABLET | Freq: Two times a day (BID) | ORAL | 0 refills | Status: AC

## 2020-05-28 NOTE — Telephone Encounter (Signed)
Please contact the patient and inform him that I would like to start him on some antibiotics in advance of his upcoming surgery.  I have sent a prescription for Bactrim DS twice daily x7 days to [facility].  Please instruct him to pick this medication up this evening and start it tomorrow morning.  I would like to make sure that he starts the medication 3 days in advance of his upcoming surgery.  He should continue to take the medication after surgery to complete the 7-day course.

## 2020-05-28 NOTE — Telephone Encounter (Signed)
Contacted patient as advised. Patient agreed he would pick up ABX this evening and start tomorrow morning. Patient agreed to continue taking the ABX after surgery to complete the 7 day course.

## 2020-06-01 ENCOUNTER — Encounter: Payer: Self-pay | Admitting: Urology

## 2020-06-01 ENCOUNTER — Ambulatory Visit: Payer: Medicare Other | Admitting: Anesthesiology

## 2020-06-01 ENCOUNTER — Encounter
Admission: RE | Disposition: A | Discharge status: Home / Self Care | Payer: Self-pay | Source: Home / Self Care | Attending: Urology

## 2020-06-01 ENCOUNTER — Observation Stay: Payer: Medicare Other

## 2020-06-01 ENCOUNTER — Observation Stay
Admission: RE | Admit: 2020-06-01 | Discharge: 2020-06-02 | Disposition: A | Discharge status: Home / Self Care | Payer: Medicare Other | Attending: Urology | Admitting: Urology

## 2020-06-01 ENCOUNTER — Other Ambulatory Visit: Payer: Self-pay

## 2020-06-01 DIAGNOSIS — I252 Old myocardial infarction: Secondary | ICD-10-CM | POA: Diagnosis not present

## 2020-06-01 DIAGNOSIS — F419 Anxiety disorder, unspecified: Secondary | ICD-10-CM | POA: Insufficient documentation

## 2020-06-01 DIAGNOSIS — Z8546 Personal history of malignant neoplasm of prostate: Secondary | ICD-10-CM | POA: Insufficient documentation

## 2020-06-01 DIAGNOSIS — N135 Crossing vessel and stricture of ureter without hydronephrosis: Secondary | ICD-10-CM | POA: Diagnosis present

## 2020-06-01 DIAGNOSIS — I2510 Atherosclerotic heart disease of native coronary artery without angina pectoris: Secondary | ICD-10-CM | POA: Diagnosis not present

## 2020-06-01 DIAGNOSIS — G4733 Obstructive sleep apnea (adult) (pediatric): Secondary | ICD-10-CM | POA: Insufficient documentation

## 2020-06-01 DIAGNOSIS — Z466 Encounter for fitting and adjustment of urinary device: Secondary | ICD-10-CM | POA: Diagnosis not present

## 2020-06-01 DIAGNOSIS — Z7982 Long term (current) use of aspirin: Secondary | ICD-10-CM | POA: Insufficient documentation

## 2020-06-01 DIAGNOSIS — K219 Gastro-esophageal reflux disease without esophagitis: Secondary | ICD-10-CM | POA: Insufficient documentation

## 2020-06-01 DIAGNOSIS — R011 Cardiac murmur, unspecified: Secondary | ICD-10-CM | POA: Insufficient documentation

## 2020-06-01 DIAGNOSIS — Z923 Personal history of irradiation: Secondary | ICD-10-CM | POA: Insufficient documentation

## 2020-06-01 DIAGNOSIS — Z87442 Personal history of urinary calculi: Secondary | ICD-10-CM | POA: Insufficient documentation

## 2020-06-01 DIAGNOSIS — M1990 Unspecified osteoarthritis, unspecified site: Secondary | ICD-10-CM | POA: Insufficient documentation

## 2020-06-01 DIAGNOSIS — Z9862 Peripheral vascular angioplasty status: Secondary | ICD-10-CM | POA: Insufficient documentation

## 2020-06-01 DIAGNOSIS — N400 Enlarged prostate without lower urinary tract symptoms: Secondary | ICD-10-CM | POA: Insufficient documentation

## 2020-06-01 DIAGNOSIS — E7800 Pure hypercholesterolemia, unspecified: Secondary | ICD-10-CM | POA: Insufficient documentation

## 2020-06-01 DIAGNOSIS — K660 Peritoneal adhesions (postprocedural) (postinfection): Secondary | ICD-10-CM | POA: Insufficient documentation

## 2020-06-01 DIAGNOSIS — G8929 Other chronic pain: Secondary | ICD-10-CM | POA: Insufficient documentation

## 2020-06-01 DIAGNOSIS — G4700 Insomnia, unspecified: Secondary | ICD-10-CM | POA: Insufficient documentation

## 2020-06-01 DIAGNOSIS — E785 Hyperlipidemia, unspecified: Secondary | ICD-10-CM | POA: Insufficient documentation

## 2020-06-01 DIAGNOSIS — C61 Malignant neoplasm of prostate: Secondary | ICD-10-CM | POA: Insufficient documentation

## 2020-06-01 DIAGNOSIS — F329 Major depressive disorder, single episode, unspecified: Secondary | ICD-10-CM | POA: Insufficient documentation

## 2020-06-01 DIAGNOSIS — Z87440 Personal history of urinary (tract) infections: Secondary | ICD-10-CM | POA: Diagnosis not present

## 2020-06-01 DIAGNOSIS — G629 Polyneuropathy, unspecified: Secondary | ICD-10-CM | POA: Insufficient documentation

## 2020-06-01 DIAGNOSIS — M5440 Lumbago with sciatica, unspecified side: Secondary | ICD-10-CM | POA: Diagnosis not present

## 2020-06-01 DIAGNOSIS — K5900 Constipation, unspecified: Secondary | ICD-10-CM | POA: Insufficient documentation

## 2020-06-01 DIAGNOSIS — Z960 Presence of urogenital implants: Secondary | ICD-10-CM

## 2020-06-01 DIAGNOSIS — Z79899 Other long term (current) drug therapy: Secondary | ICD-10-CM | POA: Insufficient documentation

## 2020-06-01 DIAGNOSIS — Z87891 Personal history of nicotine dependence: Secondary | ICD-10-CM | POA: Insufficient documentation

## 2020-06-01 DIAGNOSIS — K449 Diaphragmatic hernia without obstruction or gangrene: Secondary | ICD-10-CM | POA: Insufficient documentation

## 2020-06-01 DIAGNOSIS — Z888 Allergy status to other drugs, medicaments and biological substances status: Secondary | ICD-10-CM | POA: Insufficient documentation

## 2020-06-01 HISTORY — PX: CYSTOSCOPY W/ URETERAL STENT PLACEMENT: SHX1429

## 2020-06-01 SURGERY — XI ROBOTICALLY ASSISTED LAPAROSCOPIC URETERAL RE-IMPLANTATION
Anesthesia: General | Laterality: Right

## 2020-06-01 MED ORDER — ONDANSETRON HCL 4 MG/2ML IJ SOLN
INTRAMUSCULAR | Status: DC | PRN
  Administered 2020-06-01: 4 mg via INTRAVENOUS

## 2020-06-01 MED ORDER — ATORVASTATIN CALCIUM 20 MG PO TABS
40.0000 mg | ORAL_TABLET | Freq: Every day | ORAL | Status: DC
  Administered 2020-06-01 – 2020-06-02 (×2): 40 mg via ORAL
  Filled 2020-06-01 (×2): qty 2

## 2020-06-01 MED ORDER — HEPARIN SODIUM (PORCINE) 5000 UNIT/ML IJ SOLN
5000.0000 [IU] | Freq: Three times a day (TID) | INTRAMUSCULAR | Status: DC
  Administered 2020-06-01 – 2020-06-02 (×3): 5000 [IU] via SUBCUTANEOUS
  Filled 2020-06-01 (×3): qty 1

## 2020-06-01 MED ORDER — SUGAMMADEX SODIUM 200 MG/2ML IV SOLN
INTRAVENOUS | Status: DC | PRN
  Administered 2020-06-01: 200 mg via INTRAVENOUS

## 2020-06-01 MED ORDER — ALPRAZOLAM 0.5 MG PO TABS: 0.5000 mg | ORAL_TABLET | Freq: Every day | ORAL | Status: DC | PRN

## 2020-06-01 MED ORDER — FENTANYL CITRATE (PF) 100 MCG/2ML IJ SOLN
INTRAMUSCULAR | Status: AC
  Filled 2020-06-01: qty 2

## 2020-06-01 MED ORDER — ROCURONIUM BROMIDE 10 MG/ML (PF) SYRINGE
PREFILLED_SYRINGE | INTRAVENOUS | Status: AC
  Filled 2020-06-01: qty 10

## 2020-06-01 MED ORDER — EPHEDRINE 5 MG/ML INJ
INTRAVENOUS | Status: AC
  Filled 2020-06-01: qty 10

## 2020-06-01 MED ORDER — THROMBIN 5000 UNITS EX SOLR
CUTANEOUS | Status: AC
  Filled 2020-06-01: qty 5000

## 2020-06-01 MED ORDER — DIPHENHYDRAMINE HCL 50 MG/ML IJ SOLN: 12.5000 mg | Freq: Four times a day (QID) | INTRAMUSCULAR | Status: DC | PRN

## 2020-06-01 MED ORDER — OXYCODONE HCL 5 MG/5ML PO SOLN: 5.0000 mg | Freq: Once | ORAL | Status: AC | PRN

## 2020-06-01 MED ORDER — LIDOCAINE HCL (PF) 2 % IJ SOLN
INTRAMUSCULAR | Status: AC
  Filled 2020-06-01: qty 5

## 2020-06-01 MED ORDER — SUGAMMADEX SODIUM 200 MG/2ML IV SOLN: INTRAVENOUS | Status: DC | PRN

## 2020-06-01 MED ORDER — CEFAZOLIN SODIUM-DEXTROSE 2-4 GM/100ML-% IV SOLN
2.0000 g | INTRAVENOUS | Status: AC
  Administered 2020-06-01: 2 g via INTRAVENOUS

## 2020-06-01 MED ORDER — BUPIVACAINE HCL (PF) 0.5 % IJ SOLN
INTRAMUSCULAR | Status: AC
  Filled 2020-06-01: qty 30

## 2020-06-01 MED ORDER — ONDANSETRON HCL 4 MG/2ML IJ SOLN: 4.0000 mg | INTRAMUSCULAR | Status: DC | PRN

## 2020-06-01 MED ORDER — DEXAMETHASONE SODIUM PHOSPHATE 10 MG/ML IJ SOLN
INTRAMUSCULAR | Status: DC | PRN
  Administered 2020-06-01: 10 mg via INTRAVENOUS

## 2020-06-01 MED ORDER — FENTANYL CITRATE (PF) 100 MCG/2ML IJ SOLN
INTRAMUSCULAR | Status: DC | PRN
  Administered 2020-06-01: 50 ug via INTRAVENOUS
  Administered 2020-06-01: 25 ug via INTRAVENOUS
  Administered 2020-06-01: 50 ug via INTRAVENOUS
  Administered 2020-06-01: 25 ug via INTRAVENOUS
  Administered 2020-06-01: 50 ug via INTRAVENOUS

## 2020-06-01 MED ORDER — DIPHENHYDRAMINE HCL 12.5 MG/5ML PO ELIX
12.5000 mg | ORAL_SOLUTION | Freq: Four times a day (QID) | ORAL | Status: DC | PRN
  Filled 2020-06-01: qty 5

## 2020-06-01 MED ORDER — OXYCODONE HCL 5 MG PO TABS
5.0000 mg | ORAL_TABLET | Freq: Once | ORAL | Status: AC | PRN
  Administered 2020-06-01: 5 mg via ORAL

## 2020-06-01 MED ORDER — ACETAMINOPHEN 10 MG/ML IV SOLN
INTRAVENOUS | Status: AC
  Filled 2020-06-01: qty 100

## 2020-06-01 MED ORDER — DULOXETINE HCL 60 MG PO CPEP
60.0000 mg | ORAL_CAPSULE | Freq: Two times a day (BID) | ORAL | Status: DC
  Administered 2020-06-01 – 2020-06-02 (×2): 60 mg via ORAL
  Filled 2020-06-01: qty 2
  Filled 2020-06-01 (×2): qty 1
  Filled 2020-06-01: qty 2
  Filled 2020-06-01: qty 1

## 2020-06-01 MED ORDER — OXYCODONE HCL 5 MG PO TABS
ORAL_TABLET | ORAL | Status: AC
  Filled 2020-06-01: qty 1

## 2020-06-01 MED ORDER — ASPIRIN EC 81 MG PO TBEC
81.0000 mg | DELAYED_RELEASE_TABLET | Freq: Every day | ORAL | Status: DC
  Administered 2020-06-01 – 2020-06-02 (×2): 81 mg via ORAL
  Filled 2020-06-01 (×2): qty 1

## 2020-06-01 MED ORDER — BUPIVACAINE LIPOSOME 1.3 % IJ SUSP
INTRAMUSCULAR | Status: AC
  Filled 2020-06-01: qty 20

## 2020-06-01 MED ORDER — CHLORHEXIDINE GLUCONATE 0.12 % MT SOLN
OROMUCOSAL | Status: AC
  Administered 2020-06-01: 15 mL via OROMUCOSAL
  Filled 2020-06-01: qty 15

## 2020-06-01 MED ORDER — ROCURONIUM BROMIDE 100 MG/10ML IV SOLN
INTRAVENOUS | Status: DC | PRN
  Administered 2020-06-01: 10 mg via INTRAVENOUS
  Administered 2020-06-01: 50 mg via INTRAVENOUS
  Administered 2020-06-01 (×2): 20 mg via INTRAVENOUS

## 2020-06-01 MED ORDER — LINACLOTIDE 145 MCG PO CAPS
145.0000 ug | ORAL_CAPSULE | Freq: Every day | ORAL | Status: DC
  Administered 2020-06-02: 145 ug via ORAL
  Filled 2020-06-01 (×2): qty 1

## 2020-06-01 MED ORDER — BELLADONNA ALKALOIDS-OPIUM 16.2-60 MG RE SUPP
1.0000 | Freq: Four times a day (QID) | RECTAL | Status: DC | PRN
  Filled 2020-06-01: qty 1

## 2020-06-01 MED ORDER — ALBUTEROL SULFATE (2.5 MG/3ML) 0.083% IN NEBU: 2.5000 mg | INHALATION_SOLUTION | Freq: Four times a day (QID) | RESPIRATORY_TRACT | Status: DC | PRN

## 2020-06-01 MED ORDER — DOCUSATE SODIUM 100 MG PO CAPS
100.0000 mg | ORAL_CAPSULE | Freq: Two times a day (BID) | ORAL | Status: DC
  Administered 2020-06-01 – 2020-06-02 (×3): 100 mg via ORAL
  Filled 2020-06-01 (×3): qty 1

## 2020-06-01 MED ORDER — HYDROMORPHONE HCL 1 MG/ML IJ SOLN
0.2500 mg | INTRAMUSCULAR | Status: DC | PRN
  Administered 2020-06-01 (×3): 0.25 mg via INTRAVENOUS
  Administered 2020-06-01: 0.5 mg via INTRAVENOUS

## 2020-06-01 MED ORDER — SULFAMETHOXAZOLE-TRIMETHOPRIM 800-160 MG PO TABS
1.0000 | ORAL_TABLET | Freq: Two times a day (BID) | ORAL | Status: DC
  Administered 2020-06-01 – 2020-06-02 (×2): 1 via ORAL
  Filled 2020-06-01 (×3): qty 1

## 2020-06-01 MED ORDER — FAMOTIDINE 20 MG PO TABS
ORAL_TABLET | ORAL | Status: AC
  Administered 2020-06-01: 20 mg via ORAL
  Filled 2020-06-01: qty 1

## 2020-06-01 MED ORDER — DARIFENACIN HYDROBROMIDE ER 15 MG PO TB24
15.0000 mg | ORAL_TABLET | Freq: Every day | ORAL | Status: DC
  Filled 2020-06-01 (×3): qty 1

## 2020-06-01 MED ORDER — HYDROMORPHONE HCL 1 MG/ML IJ SOLN
INTRAMUSCULAR | Status: AC
  Administered 2020-06-01: 0.25 mg via INTRAVENOUS
  Filled 2020-06-01: qty 1

## 2020-06-01 MED ORDER — HYDROMORPHONE HCL 1 MG/ML IJ SOLN
INTRAMUSCULAR | Status: AC
  Administered 2020-06-01: 0.5 mg via INTRAVENOUS
  Filled 2020-06-01: qty 1

## 2020-06-01 MED ORDER — FAMOTIDINE 20 MG PO TABS: 20.0000 mg | ORAL_TABLET | Freq: Once | ORAL | Status: AC

## 2020-06-01 MED ORDER — ORAL CARE MOUTH RINSE: 15.0000 mL | Freq: Once | OROMUCOSAL | Status: AC

## 2020-06-01 MED ORDER — TRAZODONE HCL 50 MG PO TABS
25.0000 mg | ORAL_TABLET | Freq: Every evening | ORAL | Status: DC | PRN
  Administered 2020-06-02: 25 mg via ORAL
  Filled 2020-06-01: qty 1

## 2020-06-01 MED ORDER — CEFAZOLIN SODIUM-DEXTROSE 2-4 GM/100ML-% IV SOLN
INTRAVENOUS | Status: AC
  Filled 2020-06-01: qty 100

## 2020-06-01 MED ORDER — ALBUTEROL SULFATE 108 (90 BASE) MCG/ACT IN AEPB: 2.0000 | INHALATION_SPRAY | Freq: Four times a day (QID) | RESPIRATORY_TRACT | Status: DC | PRN

## 2020-06-01 MED ORDER — BUPIVACAINE HCL (PF) 0.5 % IJ SOLN
INTRAMUSCULAR | Status: DC | PRN
  Administered 2020-06-01: 30 mL

## 2020-06-01 MED ORDER — CHLORHEXIDINE GLUCONATE 0.12 % MT SOLN: 15.0000 mL | Freq: Once | OROMUCOSAL | Status: AC

## 2020-06-01 MED ORDER — INDOCYANINE GREEN 25 MG IV SOLR
INTRAVENOUS | Status: DC | PRN
  Administered 2020-06-01: 50 mg

## 2020-06-01 MED ORDER — MORPHINE SULFATE (PF) 2 MG/ML IV SOLN
2.0000 mg | INTRAVENOUS | Status: DC | PRN
  Administered 2020-06-01: 4 mg via INTRAVENOUS
  Administered 2020-06-01: 2 mg via INTRAVENOUS
  Filled 2020-06-01: qty 2
  Filled 2020-06-01: qty 1

## 2020-06-01 MED ORDER — PROPOFOL 10 MG/ML IV BOLUS
INTRAVENOUS | Status: AC
  Filled 2020-06-01: qty 20

## 2020-06-01 MED ORDER — MEPERIDINE HCL 50 MG/ML IJ SOLN: 6.2500 mg | INTRAMUSCULAR | Status: DC | PRN

## 2020-06-01 MED ORDER — PROPOFOL 10 MG/ML IV BOLUS
INTRAVENOUS | Status: DC | PRN
  Administered 2020-06-01: 120 mg via INTRAVENOUS

## 2020-06-01 MED ORDER — CHLORHEXIDINE GLUCONATE CLOTH 2 % EX PADS
6.0000 | MEDICATED_PAD | Freq: Every day | CUTANEOUS | Status: DC
  Administered 2020-06-02: 6 via TOPICAL

## 2020-06-01 MED ORDER — FENTANYL CITRATE (PF) 100 MCG/2ML IJ SOLN
INTRAMUSCULAR | Status: AC
  Administered 2020-06-01: 50 ug via INTRAVENOUS
  Filled 2020-06-01: qty 2

## 2020-06-01 MED ORDER — LACTATED RINGERS IV SOLN: INTRAVENOUS | Status: DC

## 2020-06-01 MED ORDER — ACETAMINOPHEN 325 MG PO TABS: 650.0000 mg | ORAL_TABLET | ORAL | Status: DC | PRN

## 2020-06-01 MED ORDER — OXYCODONE-ACETAMINOPHEN 5-325 MG PO TABS
1.0000 | ORAL_TABLET | ORAL | Status: DC | PRN
  Administered 2020-06-01 – 2020-06-02 (×4): 2 via ORAL
  Filled 2020-06-01 (×5): qty 2

## 2020-06-01 MED ORDER — FENTANYL CITRATE (PF) 100 MCG/2ML IJ SOLN
25.0000 ug | INTRAMUSCULAR | Status: DC | PRN
  Administered 2020-06-01 (×2): 50 ug via INTRAVENOUS

## 2020-06-01 MED ORDER — SODIUM CHLORIDE 0.9 % IV SOLN: INTRAVENOUS | Status: DC

## 2020-06-01 MED ORDER — PROMETHAZINE HCL 25 MG/ML IJ SOLN: 6.2500 mg | INTRAMUSCULAR | Status: DC | PRN

## 2020-06-01 SURGICAL SUPPLY — 111 items
ADAPTER CATH URET CONN 4-6FR (ADAPTER)
ADAPTER GOLDBERG URETERAL (ADAPTER)
APPLICATOR SURGIFLO ENDO (HEMOSTASIS)
BAG BILE T-TUBES STRL (MISCELLANEOUS)
BAG DRAIN CYSTO-URO LG1000N (MISCELLANEOUS) ×2
BAG URINE DRAIN 2000ML AR STRL (UROLOGICAL SUPPLIES) ×4
BAG URO DRAIN 4000ML (MISCELLANEOUS) ×2
BNDG COHESIVE 4X5 TAN STRL (GAUZE/BANDAGES/DRESSINGS)
BRUSH SCRUB EZ  4% CHG (MISCELLANEOUS) ×2
BRUSH SCRUB EZ 1% IODOPHOR (MISCELLANEOUS) ×2
BRUSH SCRUB EZ 4% CHG (MISCELLANEOUS) ×1
BULB RESERV EVAC DRAIN JP 100C (MISCELLANEOUS) ×2
CANISTER SUCT 1200ML W/VALVE (MISCELLANEOUS) ×2
CATH FOLEY 2WAY  5CC 16FR (CATHETERS) ×2
CATH FOLEY 2WAY 5CC 18FR (CATHETERS) ×2
CATH FOLEY 3WAY 30CC 18FR (CATHETERS)
CATH FOLEY SIL 2WAY 14FR5CC (CATHETERS)
CATH URETL 5X70 OPEN END (CATHETERS)
CATH URTH 16FR FL 2W BLN LF (CATHETERS) ×1
CHLORAPREP W/TINT 26 (MISCELLANEOUS) ×4
CLIP VESOLOCK LG 6/CT PURPLE (CLIP) ×6
CONRAY 43 FOR UROLOGY 50M (MISCELLANEOUS)
COVER TIP SHEARS 8 DVNC (MISCELLANEOUS) ×2
COVER TIP SHEARS 8MM DA VINCI (MISCELLANEOUS) ×4
COVER WAND RF STERILE (DRAPES) ×2
DEFOGGER SCOPE WARMER CLEARIFY (MISCELLANEOUS) ×2
DERMABOND ADVANCED (GAUZE/BANDAGES/DRESSINGS) ×1
DERMABOND ADVANCED .7 DNX12 (GAUZE/BANDAGES/DRESSINGS) ×1
DRAIN CHANNEL JP 19F (MISCELLANEOUS) ×2
DRAPE ARM DVNC X/XI (DISPOSABLE) ×4
DRAPE COLUMN DVNC XI (DISPOSABLE) ×1
DRAPE DA VINCI XI ARM (DISPOSABLE) ×8
DRAPE DA VINCI XI COLUMN (DISPOSABLE) ×2
DRAPE LEGGINS SURG 28X43 STRL (DRAPES) ×2
DRAPE SURG 17X11 SM STRL (DRAPES) ×8
DRAPE UNDER BUTTOCK W/FLU (DRAPES) ×2
DRAPE UTILITY 15X26 TOWEL STRL (DRAPES)
ELECT REM PT RETURN 9FT ADLT (ELECTROSURGICAL) ×2
GLIDEWIRE STR 0.035 150CM 3CM (WIRE) ×2
GLOVE BIO SURGEON STRL SZ 6.5 (GLOVE) ×6
GLOVE BIOGEL M 8.0 STRL (GLOVE)
GLOVE BIOGEL PI IND STRL 6.5 (GLOVE) ×2
GLOVE BIOGEL PI INDICATOR 6.5 (GLOVE) ×2
GOWN STRL REUS W/ TWL LRG LVL3 (GOWN DISPOSABLE) ×4
GOWN STRL REUS W/ TWL XL LVL3 (GOWN DISPOSABLE) ×1
GOWN STRL REUS W/TWL LRG LVL3 (GOWN DISPOSABLE) ×8
GOWN STRL REUS W/TWL XL LVL3 (GOWN DISPOSABLE) ×2
GRASPER SUT TROCAR 14GX15 (MISCELLANEOUS) ×2
GUIDEWIRE STR DUAL SENSOR (WIRE) ×2
HEMOSTAT SURGICEL 2X14 (HEMOSTASIS) ×2
HOLDER FOLEY CATH W/STRAP (MISCELLANEOUS) ×2
IRRIGATOR STRYKERFLOW (MISCELLANEOUS) ×2
IV NS 1000ML (IV SOLUTION) ×4
IV NS 1000ML BAXH (IV SOLUTION) ×2
KIT IMAGING PINPOINTPAQ (MISCELLANEOUS) ×4
KIT PINK PAD W/HEAD ARE REST (MISCELLANEOUS) ×2
KIT TURNOVER CYSTO (KITS) ×2
KIT TURNOVER KIT A (KITS) ×2
LABEL OR SOLS (LABEL)
LOOP RED MAXI  1X406MM (MISCELLANEOUS) ×2
LOOP VESSEL MAXI  1X406 RED (MISCELLANEOUS) ×2
LOOP VESSEL MAXI 1X406 RED (MISCELLANEOUS) ×2
MARKER SKIN DUAL TIP RULER LAB (MISCELLANEOUS) ×2
NDL HYPO 25X1 1.5 SAFETY (NEEDLE) ×1 IMPLANT
NDL INSUFFLATION 14GA 120MM (NEEDLE) ×1 IMPLANT
NEEDLE HYPO 25X1 1.5 SAFETY (NEEDLE) ×2
NEEDLE INSUFFLATION 14GA 120MM (NEEDLE) ×2
NS IRRIG 500ML POUR BTL (IV SOLUTION) ×4
OBTURATOR OPTICAL STANDARD 8MM (TROCAR) ×2
OBTURATOR OPTICAL STND 8 DVNC (TROCAR) ×1
PACK CYSTO AR (MISCELLANEOUS) ×2
PACK LAP CHOLECYSTECTOMY (MISCELLANEOUS) ×2
PENCIL ELECTRO HAND CTR (MISCELLANEOUS) ×2 IMPLANT
PLUG CATH AND CAP STER (CATHETERS)
SEAL CANN UNIV 5-8 DVNC XI (MISCELLANEOUS) ×4
SEAL XI 5MM-8MM UNIVERSAL (MISCELLANEOUS) ×8
SET CYSTO W/LG BORE CLAMP LF (SET/KITS/TRAYS/PACK) ×2
SET TUBE SMOKE EVAC HIGH FLOW (TUBING) ×2
SIDE-ARM FITTING (MISCELLANEOUS)
SOL .9 NS 3000ML IRR  AL (IV SOLUTION)
SOL .9 NS 3000ML IRR UROMATIC (IV SOLUTION)
SOLUTION ELECTROLUBE (MISCELLANEOUS) ×2
SPOGE SURGIFLO 8M (HEMOSTASIS)
SPONGE LAP 4X18 RFD (DISPOSABLE) ×2
SPONGE SURGIFLO 8M (HEMOSTASIS)
SPONGE VERSALON 4X4 4PLY (MISCELLANEOUS)
STAPLER SKIN PROX 35W (STAPLE) ×2
STENT URET 6FRX24 CONTOUR (STENTS)
STENT URET 6FRX26 CONTOUR (STENTS) ×2
STRAP SAFETY 5IN WIDE (MISCELLANEOUS) ×2
SURGILUBE 2OZ TUBE FLIPTOP (MISCELLANEOUS) ×2
SUT DVC VLOC 90 3-0 CV23 UNDY (SUTURE) ×2
SUT DVC VLOC 90 3-0 CV23 VLT (SUTURE) ×2
SUT ETHILON 3-0 FS-10 30 BLK (SUTURE) ×2
SUT MNCRL 4-0 (SUTURE) ×2
SUT MNCRL 4-0 27XMFL (SUTURE) ×1
SUT MNCRL AB 4-0 PS2 18 (SUTURE) ×2
SUT SILK 0 (SUTURE)
SUT SILK 0 30XBRD TIE 6 (SUTURE)
SUT VIC AB 2-0 SH 27 (SUTURE) ×2
SUT VIC AB 2-0 SH 27XBRD (SUTURE) ×1
SUT VIC AB 3-0 SH 27 (SUTURE) ×2
SUT VIC AB 3-0 SH 27X BRD (SUTURE) ×1
SUT VIC AB 4-0 RB1 27 (SUTURE) ×8
SUT VIC AB 4-0 RB1 27X BRD (SUTURE) ×4
SYRINGE IRR TOOMEY STRL 70CC (SYRINGE) ×2
TAPE CLOTH 3X10 WHT NS LF (GAUZE/BANDAGES/DRESSINGS) ×2
TROCAR 12M 150ML BLUNT (TROCAR)
TROCAR XCEL 12X100 BLDLESS (ENDOMECHANICALS) ×2
TUBING ART PRESS 48 MALE/FEM (TUBING)
WATER STERILE IRR 1000ML POUR (IV SOLUTION) ×2

## 2020-06-01 NOTE — Anesthesia Procedure Notes (Signed)
Procedure Name: Intubation Date/Time: 06/01/2020 7:50 AM Performed by: Allean Found, CRNA Pre-anesthesia Checklist: Patient identified, Patient being monitored, Timeout performed, Emergency Drugs available and Suction available Patient Re-evaluated:Patient Re-evaluated prior to induction Oxygen Delivery Method: Circle system utilized Preoxygenation: Pre-oxygenation with 100% oxygen Induction Type: IV induction Ventilation: Mask ventilation without difficulty Laryngoscope Size: Mac, McGraph and 4 Grade View: Grade I Tube type: Oral Tube size: 7.0 mm Number of attempts: 1 Airway Equipment and Method: Stylet Placement Confirmation: ETT inserted through vocal cords under direct vision,  positive ETCO2 and breath sounds checked- equal and bilateral Secured at: 22 cm Tube secured with: Tape Dental Injury: Teeth and Oropharynx as per pre-operative assessment

## 2020-06-01 NOTE — Anesthesia Preprocedure Evaluation (Signed)
Anesthesia Evaluation  Patient identified by [name], date of birth, ID band Patient awake    Reviewed: Allergy & Precautions, NPO status , Patient's Chart, lab work & pertinent test results  History of Anesthesia Complications Negative for: history of anesthetic complications  Airway Mallampati: II  TM Distance: >3 FB Neck ROM: Full    Dental  (+) Upper Dentures, Lower Dentures   Pulmonary sleep apnea (not compliant with CPAP) , neg COPD, former smoker,    breath sounds clear to auscultation- rhonchi (-) wheezing      Cardiovascular + CAD and + Past MI (1990s, angioplasty but no stents)  (-) Cardiac Stents and (-) CABG  Rhythm:Regular Rate:Normal - Systolic murmurs and - Diastolic murmurs    Neuro/Psych neg Seizures PSYCHIATRIC DISORDERS Anxiety Depression negative neurological ROS     GI/Hepatic Neg liver ROS, hiatal hernia, GERD  ,  Endo/Other  negative endocrine ROSneg diabetes  Renal/GU negative Renal ROS     Musculoskeletal  (+) Arthritis ,   Abdominal (+) - obese,   Peds  Hematology negative hematology ROS (+)   Anesthesia Other Findings Past Medical History: 01/01/2015: Acute bronchitis 05/19/2016: Acute upper respiratory infection No date: Anxiety No date: Arthritis     Comment:  back,wrists,hx. previous fractures No date: BPH (benign prostatic hypertrophy) 04/29/2008: BRONCHITIS, CHRONIC     Comment:  Qualifier: Diagnosis of  By: Halford Chessman MD, Vine[place]   10/16/2009: CAD     Comment:  Qualifier: Diagnosis of  By: Lynden Ang   No date: CAD (coronary artery disease)     Comment:  angioplasty  No date: Chronic back pain No date: Chronic bronchitis 10/14/2016: Chronic low back pain with sciatica 04/27/2017: Confusion No date: Depression 12/20/2013: Encounter for preventative adult health care exam with  abnormal findings 04/27/2017: Fever No date: GERD (gastroesophageal reflux disease) 05/12/2015:  Gynecomastia No date: H/O hiatal hernia     Comment:  pts wife states pt does not have  No date: Heart murmur No date: History of kidney stones No date: Hypercholesteremia 05/19/2016: Hyperglycemia 04/29/2008: HYPERLIPIDEMIA     Comment:  Qualifier: Diagnosis of  By: Jim Like   12/20/2013: Impaired glucose tolerance 04/27/2017: Insomnia 10/14/2016: Lumbosacral radiculopathy No date: MI (myocardial infarction) ([place])     Comment:  mid-'90's 04/29/2008: MYOCARDIAL INFARCTION     Comment:  Qualifier: History of  By: Jim Like   05/13/2011: OSA (obstructive sleep apnea)     Comment:  Split night study 2011:  AHI 21/hr with obstructive and               central events.  Placed on cpap and titrated to 13cm with              reasonable control, but attempts to increase pressure               increased the number of central events.  Medium resmed               quattro full face mask used.   No date: Peripheral neuropathy     Comment:  peripheral neuropathy 10/21/2013: Phimosis/adherent prepuce No date: Pneumonia No date: Prostate cancer ([place]) 11/05/2010: Shortness of breath     Comment:  Qualifier: Diagnosis of  By: Marilynne Halsted, RN, BSN, Jacquelyn   No date: Sleep apnea     Comment:  mild-no cpap, unable to tolerate No date: Umbilical hernia 4/48/1856: Umbilical hernia without obstruction and without gangrene 04/27/2017: URI (upper respiratory infection) 12/01/2016: UTI (urinary tract infection) No  date: Varicose veins 10/14/2016: Varicose veins of bilateral lower extremities with other  complications 06/28/1637: Wheezing   Reproductive/Obstetrics                             Anesthesia Physical Anesthesia Plan  ASA: III  Anesthesia Plan: General   Post-op Pain Management:    Induction: Intravenous  PONV Risk Score and Plan: 1 and Ondansetron and Dexamethasone  Airway Management Planned: Oral ETT  Additional Equipment:   Intra-op Plan:    Post-operative Plan: Extubation in OR  Informed Consent: I have reviewed the patients History and Physical, chart, labs and discussed the procedure including the risks, benefits and alternatives for the proposed anesthesia with the patient or authorized representative who has indicated his/her understanding and acceptance.     Dental advisory given  Plan Discussed with: CRNA and Anesthesiologist  Anesthesia Plan Comments:         Anesthesia Quick Evaluation

## 2020-06-01 NOTE — Interval H&P Note (Signed)
History and Physical Interval Note:  06/01/2020 7:24 AM  Albert Wade  has presented today for surgery, with the diagnosis of right ureteral stricture.  The various methods of treatment have been discussed with the patient and family. After consideration of risks, benefits and other options for treatment, the patient has consented to  Procedure(s): XI ROBOTICALLY ASSISTED LAPAROSCOPIC URETERAL RE-IMPLANTATION with possible boari flap/psoas hitch (Right) CYSTOSCOPY WITH RETROGRADE PYELOGRAM/URETERAL STENT Exchange (Right) INDOCYANINE GREEN FLUORESCENCE IMAGING (ICG) (Right) as a surgical intervention.  The patient's history has been reviewed, patient examined, no change in status, stable for surgery.  I have reviewed the patient's chart and labs.  Questions were answered to the patient's satisfaction.    RRR CTAB  Hollice Espy

## 2020-06-01 NOTE — Op Note (Addendum)
Date of procedure: 06/01/20  Preoperative diagnosis:  1. Right distal ureteral stricture  Postoperative diagnosis:  1. Same as above  Procedure: 1. Cystoscopy with ureteral stent removal 2. Instillation of ureteral ICG contrast on right 3. Robotic right ureteral reimplant with psoas hitch  Surgeon: Hollice Espy, MD  Assistant Surgeon: Nickolas Madrid, MD  Anesthesia: General  Complications: None  Intraoperative findings: Fibrotic densely adhesed right distal ureter consistent with radiation changes.  Tension-free anastomosis was achieved with bladder mobilization and psoas hitch.  EBL: 50 cc  Specimens: None  Drains: 6 x 26 French double-J ureteral stent on right, 18 French Foley catheter  Indication: Albert Wade is a 74 y.o. 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  He was previously managed with indwelling Foley but ultimately is elected to pursue ureteral reimplant is more definitive management.  After reviewing the management options for treatment, he elected to proceed with the above surgical procedure(s). We have discussed the potential benefits and risks of the procedure, side effects of the proposed treatment, the likelihood of the patient achieving the goals of the procedure, and any potential problems that might occur during the procedure or recuperation. Informed consent has been obtained.  Description of procedure:  The patient was taken to the operating room and general anesthesia was induced.  The patient was placed in the dorsal lithotomy position, prepped and draped in the usual sterile fashion, and preoperative antibiotics were administered. A preoperative time-out was performed.   A 21 French scope was passed per urethra into the bladder.  Attention was turned to the right distal ureter for which a ureteral  stent was seen emanating.  This was relatively encrusted night was attempted to cannulate the stent by bring it to the level of the urethral meatus with a wire but due to encrustation, was unable to do so.  I readvanced the scope and with a 5 [place] open-ended ureteral catheter and wire, I was able to advance an open-ended presumably to level the proximal ureter as it slid easily over the wire.  Upon withdrawing the open-ended ureteral catheter back, I instilled 10 cc of 25 mg of ICG into the ureter to help with ureteral identification.  Additional ICG was left in the bladder as well.    The patient was then reprepped and draped in the standard sterile fashion at this point in time with arms tucked and care to taken to pad all pressure points.  He was strapped to the table.  He was placed in the lithotomy position.  He was then prepped and draped in the standard sterile fashion and a 16 French Foley catheter was placed sterilely on the field.  The Veress needle was used in the left upper quadrant 2 fingerbreadths below the costal margin to achieve insufflation.  Opening pressures were 4 and the abdomen insufflated quite easily.  At this point time, the left laparoscopic port site was placed using a 5 mm Visiport which revealed some small adhesions, primarily fat in the anterior abdominal wall in the midline but otherwise was clear for the other port sites.  An additional 12 mm port was placed in the right lower quadrant which was preclosed closed using a Carter-Thomason and a 0 Vicryl suture.  A robotic 8 mm trocar was placed supra umbilically just above the umbilical mesh.  Finally, the 5 mm port was  exchanged for an 8 mm and a second 8 mm robotic arm was used in the left lower quadrant for a total of 4 robotic arms 1 assistant port.  A 5 mm was placed in the right upper quadrant for the suction port.  Next, attention was turned to identification of the right ureter.  I used the ICG to help locate the ureter  almost immediately.  Attempt to take down some pelvic adhesions of both small bowel as well as colon to help expose the ureter.  I was able to completely encircle the ureter by opening the peritoneum and then eventually filling it down just distal to the level of the iliacs.  This point time, the ureter itself became extremely fibrotic consistent with radiation changes and overall that the surrounding tissue appeared quite disease.  I placed a Weck clip at the distalmost aspect of this and transected the ureter at this level.  Then continue to dissect out the ureter more proximally to allow for extra length.  Next, the bladder was taken down by transecting the medial umbilical ligaments.  There was some more dense adhesions to the anterior abdominal wall on the right compared to the left consistent with known radiation fibrosis within the right hemipelvis.  Ultimately, I also freed up the left bladder extensively as well in order to allow for bladder mobilization.  The right psoas muscle was then identified and the psoas tendon as well as nerves running along the muscle and a 2-0 Vicryl was used within the muscle to hitch the mobilized bladder to once tied down, this provided a good foundation for tension-free anastomosis.  Care was taken to ensure that the suture was placed in parallel with the nerves in order to avoid nerve entrapment.  I then began to identify the location of the ureteral implant within the right dome of the bladder by defatting the dome.  The bladder was filled.  Ultimately, I opened the small opening which was the most appropriate location for the reimplant.  Finally, attention was turned to creation of the anastomosis.  These were 3-0 Vicryl on SH within the apex of a spatulated ureter to tie down to the lower portion of the anastomosis.  I then used 2 running 3 oh barbed sutures to achieve the remainder of the anastomosis.  Prior to closing the anastomosis completely, a wire was placed up to  the level of the kidney and a ureteral stent went smoothly approximately.  When the wire was removed, coil was noted distally which was dunked into the bladder.  The remainder of the open bladder was then oversewn in 2 layers using the same 2 barbed suture to achieve a watertight anastomosis.  Once tied down, this visually looked quite good.  The bladder was then filled with 200 cc of saline and no visible leak was identified.  Pneumoperitoneum was dropped and no active bleeding was noted.  I did place Surgicel in the dissection beds as a precaution.  A drain was placed using the left lateral robotic arm port and placed into the dependent portion of the pelvis.  The robotic arms were then all removed after the machine was undocked.  The preplaced suture was tied down and all additional skin was closed using 4-0 Monocryl suture.  A drain stitch was used to secure the drain.  Notably at the end of the case, we did exchange the 42 [place] for an 12 French Foley catheter to allow maximal drainage of the bladder.  Patient  was then reversed from anesthesia and taken to the back in stable condition.  Plan: Advance his diet as tolerated.  Consider checking drain creatinine depending on output and labs.  Plan for cystogram and Foley removal in 1 week followed by cystoscopy stent removal in 6 weeks.  Hollice Espy, M.D.

## 2020-06-01 NOTE — Transfer of Care (Signed)
Immediate Anesthesia Transfer of Care Note  Patient: Albert Wade  Procedure(s) Performed: XI ROBOTICALLY ASSISTED LAPAROSCOPIC URETERAL RE-IMPLANTATION with possible boari flap/psoas hitch (Right ) CYSTOSCOPY WITH RETROGRADE PYELOGRAM/URETERAL STENT Exchange (Right ) INDOCYANINE GREEN FLUORESCENCE IMAGING (ICG) (Right )  Patient Location: PACU  Anesthesia Type:General  Level of Consciousness: sedated  Airway & Oxygen Therapy: Patient Spontanous Breathing and Patient connected to face mask oxygen  Post-op Assessment: Report given to RN and Post -op Vital signs reviewed and stable  Post vital signs: Reviewed and stable  Last Vitals:  Vitals Value Taken Time  BP 151/61 06/01/20 1134  Temp 36.2 C 06/01/20 1134  Pulse 87 06/01/20 1141  Resp 16 06/01/20 1141  SpO2 99 % 06/01/20 1141  Vitals shown include unvalidated device data.  Last Pain:  Vitals:   06/01/20 0629  TempSrc: Oral  PainSc: 8          Complications: No apparent anesthesia complications

## 2020-06-02 ENCOUNTER — Other Ambulatory Visit: Payer: Self-pay | Admitting: Physician Assistant

## 2020-06-02 DIAGNOSIS — N135 Crossing vessel and stricture of ureter without hydronephrosis: Secondary | ICD-10-CM | POA: Diagnosis not present

## 2020-06-02 LAB — BASIC METABOLIC PANEL WITH GFR
Anion gap: 7 (ref 5–15)
BUN: 14 mg/dL (ref 8–23)
CO2: 29 mmol/L (ref 22–32)
Calcium: 8.7 mg/dL — ABNORMAL LOW (ref 8.9–10.3)
Chloride: 106 mmol/L (ref 98–111)
Creatinine, Ser: 0.97 mg/dL (ref 0.61–1.24)
GFR calc Af Amer: >60 mL/min
GFR calc non Af Amer: >60 mL/min
Glucose, Bld: 135 mg/dL — ABNORMAL HIGH (ref 70–99)
Potassium: 4.5 mmol/L (ref 3.5–5.1)
Sodium: 142 mmol/L (ref 135–145)

## 2020-06-02 LAB — CBC
HCT: 33.4 % — ABNORMAL LOW (ref 39.0–52.0)
Hemoglobin: 11.5 g/dL — ABNORMAL LOW (ref 13.0–17.0)
MCH: 30.3 pg (ref 26.0–34.0)
MCHC: 34.4 g/dL (ref 30.0–36.0)
MCV: 88.1 fL (ref 80.0–100.0)
Platelets: 157 10*3/uL (ref 150–400)
RBC: 3.79 MIL/uL — ABNORMAL LOW (ref 4.22–5.81)
RDW: 12.5 % (ref 11.5–15.5)
WBC: 8.3 10*3/uL (ref 4.0–10.5)
nRBC: 0 % (ref 0.0–0.2)

## 2020-06-02 LAB — CREATININE, FLUID (PLEURAL, PERITONEAL, JP DRAINAGE): Creat, Fluid: 0.9 mg/dL

## 2020-06-02 MED ORDER — OXYCODONE HCL 5 MG PO CAPS: 5.0000 mg | ORAL_CAPSULE | ORAL | 0 refills | Status: DC | PRN

## 2020-06-02 MED ORDER — DOCUSATE SODIUM 100 MG PO CAPS: 100.0000 mg | ORAL_CAPSULE | Freq: Two times a day (BID) | ORAL | 0 refills | Status: AC

## 2020-06-02 NOTE — Discharge Summary (Signed)
Date of admission: 06/01/2020  Date of discharge: 06/02/2020  Admission diagnosis: Distal right ureteral stricture  Discharge diagnosis: Same as above  Secondary diagnoses:  Patient Active Problem List   Diagnosis Date Noted   Ureteral stricture, right 06/01/2020   Weight loss 10/08/2019   Abdominal pain 10/08/2019   Constipation 10/08/2019   Fever 04/27/2017   URI (upper respiratory infection) 04/27/2017   Slowing, urinary stream 04/27/2017   Confusion 04/27/2017   Insomnia 04/27/2017   Prostate cancer ([place]) 01/27/2017   UTI (urinary tract infection) 12/01/2016   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/2016   Lumbosacral radiculopathy 10/14/2016   Varicose veins of bilateral lower extremities with other complications 62/56/3893   Umbilical hernia without obstruction and without gangrene 05/19/2016   Hyperglycemia 05/19/2016   Acute upper respiratory infection 05/19/2016   Gynecomastia 05/12/2015   Encounter for preventative adult health care exam with abnormal findings 12/20/2013   GERD (gastroesophageal reflux disease)    Anxiety    Chronic back pain    Depression    BPH (benign prostatic hypertrophy)    Peripheral neuropathy    Phimosis/adherent prepuce 10/21/2013   OSA (obstructive sleep apnea) 05/13/2011   Coronary atherosclerosis 10/16/2009   HYPERLIPIDEMIA 04/29/2008   MYOCARDIAL INFARCTION 04/29/2008   BRONCHITIS, CHRONIC 04/29/2008   History and Physical: For full details, please see admission history and physical. Briefly, Albert Wade is a 74 y.o. year old patient admitted on 06/01/2020 for scheduled cystoscopy with ureteral stent removal, instillation of ureteral ICG contrast on right, and robotic right ureteral reimplant with psoas hitch with Dr. Erlene Quan for management of a distal right ureteral stricture.  Intraoperative findings notable for fibrotic densely adhesed distal right ureter consistent with radiation changes. He was  left with a right ureteral stent, 18 French Foley catheter, and LLQ JP drain following surgery.  Hospital Course: Patient tolerated the procedure well.  He was then transferred to the floor after an uneventful PACU stay.  His hospital course was uncomplicated.  On POD#1 he had met discharge criteria: was eating a heart healthy diet, was up and ambulating independently,  pain was well controlled, was tolerating catheter well, and was ready for discharge. Drain creatinine normal; JP drain discontinued prior to discharge.  Laboratory values:  Recent Labs    06/02/20 0357  WBC 8.3  HGB 11.5*  HCT 33.4*   Recent Labs    06/02/20 0357  NA 142  K 4.5  CL 106  CO2 29  GLUCOSE 135*  BUN 14  CREATININE 0.97  CALCIUM 8.7*   Results for orders placed or performed during the hospital encounter of 05/28/20  SARS CORONAVIRUS 2 (TAT 6-24 HRS) Nasopharyngeal Nasopharyngeal Swab     Status: None   Collection Time: 05/28/20  9:56 AM   Specimen: Nasopharyngeal Swab  Result Value Ref Range Status   SARS Coronavirus 2 NEGATIVE NEGATIVE Final    Comment: (NOTE) SARS-CoV-2 target nucleic acids are NOT DETECTED. The SARS-CoV-2 RNA is generally detectable in upper and lower respiratory specimens during the acute phase of infection. Negative results do not preclude SARS-CoV-2 infection, do not rule out co-infections with other pathogens, and should not be used as the sole basis for treatment or other patient management decisions. Negative results must be combined with clinical observations, patient history, and epidemiological information. The expected result is Negative. Fact Sheet for Patients: SugarRoll.be Fact Sheet for Healthcare Providers: https://www.woods-mathews.com/ This test is not yet approved or cleared by the [place] FDA and  has been authorized for detection and/or diagnosis of SARS-CoV-2 by FDA under an Emergency Use Authorization  (EUA). This EUA will remain  in effect (meaning this test can be used) for the duration of the COVID-19 declaration under Section 56 4(b)(1) of the Act, 21 U.S.C. section 360bbb-3(b)(1), unless the authorization is terminated or revoked sooner. Performed at [facility], [place] [address], [place], [place] [address]    Disposition: Home  Discharge instruction: The patient was instructed to be ambulatory but told to refrain from heavy lifting, strenuous activity, or driving. Foley care instructions provided.  Discharge medications:  Allergies as of 06/02/2020      Reactions   Cyclobenzaprine    agitation    Seroquel [quetiapine] Hives      Medication List    TAKE these medications   Albuterol Sulfate 108 (90 Base) MCG/ACT Aepb Commonly known as: PROAIR RESPICLICK Inhale 2 puffs into the lungs 4 (four) times daily as needed. What changed: reasons to take this   ALPRAZolam 0.5 MG tablet Commonly known as: XANAX Take 0.5 mg by mouth daily as needed for anxiety.   aspirin 81 MG tablet Take 81 mg by mouth daily.   docusate sodium 100 MG capsule Commonly known as: COLACE Take 1 capsule (100 mg total) by mouth 2 (two) times daily for 7 days.   DULoxetine 60 MG capsule Commonly known as: CYMBALTA Take 60 mg by mouth 2 (two) times daily.   furosemide 40 MG tablet Commonly known as: LASIX Take 40 mg by mouth daily as needed for edema.   linaclotide 145 MCG Caps capsule Commonly known as: Linzess Take 1 capsule (145 mcg total) by mouth daily before breakfast.   oxycodone 5 MG capsule Commonly known as: OXY-IR Take 1 capsule (5 mg total) by mouth every 4 (four) hours as needed. Take 1 capsule by mouth every 4 hours as needed for breakthrough pain to supplement your existing pain regimen.   oxyCODONE-acetaminophen 10-325 MG tablet Commonly known as: PERCOCET Take 1 tablet by mouth every 4 (four) hours as needed for pain. What changed:   how much to take  additional  instructions   polyethylene glycol 17 g packet Commonly known as: MIRALAX / GLYCOLAX Take 17 g by mouth daily as needed for moderate constipation.   potassium chloride 10 MEQ tablet Commonly known as: KLOR-CON TAKE 2 TABLETS BY MOUTH PER DAY ON DAYS TAKING FUROSEMIDE   simvastatin 80 MG tablet Commonly known as: ZOCOR TAKE 1 TABLET BY MOUTH EVERY EVENING   solifenacin 10 MG tablet Commonly known as: VESICARE TAKE 1 TABLET BY MOUTH DAILY   sulfamethoxazole-trimethoprim 800-160 MG tablet Commonly known as: BACTRIM DS Take 1 tablet by mouth 2 (two) times daily for 7 days.   tamsulosin 0.4 MG Caps capsule Commonly known as: FLOMAX Take 1 capsule (0.4 mg total) by mouth daily.   traZODone 50 MG tablet Commonly known as: DESYREL Take 1/2 to 1 tablet by mouth at bedtime as needed for sleep Annual appt due in [place] must see provider for future refills What changed:   how much to take  how to take this  when to take this  reasons to take this  additional instructions       Followup: 1 week for cystogram with possible Foley removal afterward; 6 weeks for cysto stent removal as below. Future Appointments  Date Time Provider [place]  06/08/2020  9:00 AM Keary Hanak, Aldona Bar, PA-C [facility] None  07/14/2020  2:00 PM Hollice Espy,  MD [facility] None

## 2020-06-02 NOTE — Progress Notes (Signed)
Urology Inpatient Progress Note  Subjective: Albert Wade is a 74 y.o. male admitted on 06/01/2020 for scheduled cystoscopy with stent removal and robotic right ureteral reimplant with psoas hitch with Dr. Erlene Quan for management of a distal right ureteral stricture.  Operative findings notable for fibrotic, densely adhesed distal right ureter consistent with post radiation changes.  He was left with a right ureteral stent, left JP drain, and Foley catheter in place following surgery.  He is afebrile, VSS.  Creatinine stable today, 0.97.  H&H stable.  Foley catheter in place draining clear, pink urine.  JP drain in place draining frankly bloody output, 60 mL out overnight.  Today, patient reports feeling well.  He has no acute concerns.  He has not yet ambulated.  Anti-infectives: Anti-infectives (From admission, onward)   Start     Dose/Rate Route Frequency Ordered Stop   06/01/20 1400  sulfamethoxazole-trimethoprim (BACTRIM DS) 800-160 MG per tablet 1 tablet     1 tablet Oral 2 times daily 06/01/20 1356     06/01/20 0650  ceFAZolin (ANCEF) 2-4 GM/100ML-% IVPB    Note to Pharmacy: [facility]   : cabinet override      06/01/20 0650 06/01/20 1141   06/01/20 0039  ceFAZolin (ANCEF) IVPB 2g/100 mL premix     2 g 200 mL/hr over 30 Minutes Intravenous 30 min pre-op 06/01/20 0039 06/01/20 0752      Current Facility-Administered Medications  Medication Dose Route Frequency Provider Last Rate Last Admin  . 0.9 %  sodium chloride infusion   Intravenous Continuous Hollice Espy, MD 100 mL/hr at 06/02/20 0320 Rate Verify at 06/02/20 0320  . acetaminophen (TYLENOL) tablet 650 mg  650 mg Oral Q4H PRN Hollice Espy, MD      . albuterol (PROVENTIL) (2.5 MG/3ML) 0.083% nebulizer solution 2.5 mg  2.5 mg Nebulization Q6H PRN Rito Ehrlich A, RPH      . ALPRAZolam Duanne Moron) tablet 0.5 mg  0.5 mg Oral Daily PRN Hollice Espy, MD      . aspirin EC tablet 81 mg  81 mg Oral Daily Hollice Espy,  MD   81 mg at 06/02/20 2440  . atorvastatin (LIPITOR) tablet 40 mg  40 mg Oral Daily Hollice Espy, MD   40 mg at 06/02/20 0837  . Chlorhexidine Gluconate Cloth 2 % PADS 6 each  6 each Topical Daily Hollice Espy, MD   6 each at 06/02/20 [phone]  . darifenacin (ENABLEX) 24 hr tablet 15 mg  15 mg Oral Daily Hollice Espy, MD      . diphenhydrAMINE (BENADRYL) injection 12.5 mg  12.5 mg Intravenous Q6H PRN Hollice Espy, MD       Or  . diphenhydrAMINE (BENADRYL) 12.5 MG/5ML elixir 12.5 mg  12.5 mg Oral Q6H PRN Hollice Espy, MD      . docusate sodium (COLACE) capsule 100 mg  100 mg Oral BID Hollice Espy, MD   100 mg at 06/02/20 2536  . DULoxetine (CYMBALTA) DR capsule 60 mg  60 mg Oral BID Hollice Espy, MD   60 mg at 06/02/20 0838  . heparin injection 5,000 Units  5,000 Units Subcutaneous Q8H Hollice Espy, MD   5,000 Units at 06/02/20 0602  . linaclotide (LINZESS) capsule 145 mcg  145 mcg Oral QAC breakfast Hollice Espy, MD   145 mcg at 06/02/20 [phone]  . morphine 2 MG/ML injection 2-4 mg  2-4 mg Intravenous Q2H PRN Hollice Espy, MD   4 mg at 06/01/20 2156  . ondansetron (ZOFRAN)  injection 4 mg  4 mg Intravenous Q4H PRN Hollice Espy, MD      . opium-belladonna (B&O) suppository 16.2-60mg   1 suppository Rectal Q6H PRN Hollice Espy, MD      . oxyCODONE-acetaminophen (PERCOCET/ROXICET) 5-325 MG per tablet 1-2 tablet  1-2 tablet Oral Q4H PRN Hollice Espy, MD   2 tablet at 06/02/20 [phone]  . sulfamethoxazole-trimethoprim (BACTRIM DS) 800-160 MG per tablet 1 tablet  1 tablet Oral BID Hollice Espy, MD   1 tablet at 06/02/20 [phone]  . traZODone (DESYREL) tablet 25-50 mg  25-50 mg Oral QHS PRN Hollice Espy, MD   25 mg at 06/02/20 0141   Objective: Vital signs in last 24 hours: Temp:  [97.2 F (36.2 C)-98.9 F (37.2 C)] 98.4 F (36.9 C) (06/08 0513) Pulse Rate:  [67-137] 67 (06/08 0513) Resp:  [9-30] 20 (06/08 0513) BP: (123-178)/(60-105) 145/73 (06/08 0513) SpO2:  [94  %-100 %] 97 % (06/08 0513)  Intake/Output from previous day: 06/07 0701 - 06/08 0700 In: 2220.3 [P.O.:480; I.V.:1640.3; IV Piggyback:100] Out: 3428 [Urine:5425; Drains:150; Blood:50] Intake/Output this shift: Total I/O In: 120 [P.O.:120] Out: -   Physical Exam Vitals and nursing note reviewed.  Constitutional:      General: He is not in acute distress.    Appearance: He is not ill-appearing, toxic-appearing or diaphoretic.  HENT:     Head: Normocephalic and atraumatic.  Pulmonary:     Effort: Pulmonary effort is normal. No respiratory distress.  Abdominal:     Tenderness: There is no guarding or rebound.     Comments: Surgical incisions visualized over the anterior abdomen, all clean, dry, and intact with overlying adhesive.  JP drain noted at the LLQ.  Skin:    General: Skin is warm and dry.  Neurological:     Mental Status: He is alert and oriented to person, place, and time.  Psychiatric:        Mood and Affect: Mood normal.        Behavior: Behavior normal.    Lab Results:  Recent Labs    06/02/20 0357  WBC 8.3  HGB 11.5*  HCT 33.4*  PLT 157   BMET Recent Labs    06/02/20 0357  NA 142  K 4.5  CL 106  CO2 29  GLUCOSE 135*  BUN 14  CREATININE 0.97  CALCIUM 8.7*   Assessment & Plan: 74 year old male s/p robotic right ureteral implant with psoas hitch with Dr. Erlene Quan for management of a distal right ureteral stricture.  Patient recovering well.  We will plan for discharge this afternoon pending drain creatinine results.  Patient to keep Foley on discharge with plans for cystogram on POD 7 with Foley removal if no leaks.  He will then require cystoscopy stent removal in 6 weeks.  Plan: -Encourage ambulation -Advance diet as tolerated -Obtain drain creatinine this morning  Debroah Loop, PA-C 06/02/2020

## 2020-06-02 NOTE — Anesthesia Postprocedure Evaluation (Signed)
Anesthesia Post Note  Patient: Albert Wade  Procedure(s) Performed: XI ROBOTICALLY ASSISTED LAPAROSCOPIC URETERAL RE-IMPLANTATION with possible boari flap/psoas hitch (Right ) CYSTOSCOPY WITH RETROGRADE PYELOGRAM/URETERAL STENT Exchange (Right ) INDOCYANINE GREEN FLUORESCENCE IMAGING (ICG) (Right )  Patient location during evaluation: PACU Anesthesia Type: General Level of consciousness: awake and alert and oriented Pain management: pain level controlled Vital Signs Assessment: post-procedure vital signs reviewed and stable Respiratory status: spontaneous breathing, nonlabored ventilation and respiratory function stable Cardiovascular status: blood pressure returned to baseline and stable Postop Assessment: no signs of nausea or vomiting Anesthetic complications: no     Last Vitals:  Vitals:   06/02/20 0130 06/02/20 0513  BP: 139/72 (!) 145/73  Pulse: 75 67  Resp: 20 20  Temp: 36.9 C 36.9 C  SpO2: 95% 97%    Last Pain:  Vitals:   06/02/20 0513  TempSrc: Oral  PainSc:                  Solita Macadam

## 2020-06-02 NOTE — Progress Notes (Signed)
Albert Wade to be D/C'd home with wife per MD order.  Discussed prescriptions and follow up appointments with the patient. Prescriptions given to patient, medication list explained in detail. Pt verbalized understanding.  Allergies as of 06/02/2020       Reactions   Cyclobenzaprine    agitation    Seroquel [quetiapine] Hives        Medication List     TAKE these medications    Albuterol Sulfate 108 (90 Base) MCG/ACT Aepb Commonly known as: PROAIR RESPICLICK Inhale 2 puffs into the lungs 4 (four) times daily as needed. What changed: reasons to take this   ALPRAZolam 0.5 MG tablet Commonly known as: XANAX Take 0.5 mg by mouth daily as needed for anxiety.   aspirin 81 MG tablet Take 81 mg by mouth daily.   docusate sodium 100 MG capsule Commonly known as: COLACE Take 1 capsule (100 mg total) by mouth 2 (two) times daily for 7 days.   DULoxetine 60 MG capsule Commonly known as: CYMBALTA Take 60 mg by mouth 2 (two) times daily.   furosemide 40 MG tablet Commonly known as: LASIX Take 40 mg by mouth daily as needed for edema.   linaclotide 145 MCG Caps capsule Commonly known as: Linzess Take 1 capsule (145 mcg total) by mouth daily before breakfast.   oxycodone 5 MG capsule Commonly known as: OXY-IR Take 1 capsule (5 mg total) by mouth every 4 (four) hours as needed. Take 1 capsule by mouth every 4 hours as needed for breakthrough pain to supplement your existing pain regimen.   oxyCODONE-acetaminophen 10-325 MG tablet Commonly known as: PERCOCET Take 1 tablet by mouth every 4 (four) hours as needed for pain. What changed:  how much to take additional instructions   polyethylene glycol 17 g packet Commonly known as: MIRALAX / GLYCOLAX Take 17 g by mouth daily as needed for moderate constipation.   potassium chloride 10 MEQ tablet Commonly known as: KLOR-CON TAKE 2 TABLETS BY MOUTH PER DAY ON DAYS TAKING FUROSEMIDE   simvastatin 80 MG tablet Commonly known  as: ZOCOR TAKE 1 TABLET BY MOUTH EVERY EVENING   solifenacin 10 MG tablet Commonly known as: VESICARE TAKE 1 TABLET BY MOUTH DAILY   sulfamethoxazole-trimethoprim 800-160 MG tablet Commonly known as: BACTRIM DS Take 1 tablet by mouth 2 (two) times daily for 7 days.   tamsulosin 0.4 MG Caps capsule Commonly known as: FLOMAX Take 1 capsule (0.4 mg total) by mouth daily.   traZODone 50 MG tablet Commonly known as: DESYREL Take 1/2 to 1 tablet by mouth at bedtime as needed for sleep Annual appt due in JUNE must see provider for future refills What changed:  how much to take how to take this when to take this reasons to take this additional instructions        Vitals:   06/02/20 0513 06/02/20 1252  BP: (!) 145/73 (!) 149/77  Pulse: 67 66  Resp: 20 18  Temp: 98.4 F (36.9 C) 98.3 F (36.8 C)  SpO2: 97% 98%    Skin clean, dry and intact without evidence of skin break down, no evidence of skin tears noted. IV catheter discontinued intact. Site without signs and symptoms of complications. Dressing and pressure applied. Pt denies pain at this time. No complaints noted.  An After Visit Summary was printed and given to the patient. Patient escorted via [place], and D/C home via private auto.  [name]

## 2020-06-03 ENCOUNTER — Telehealth: Payer: Self-pay | Admitting: *Deleted

## 2020-06-03 NOTE — Telephone Encounter (Signed)
Pt was on TCM report admitted 06/01/20 for Distal right ureteral stricture. Pt had a cystoscopy with ureteral stent removal, instillation of ureteral ICG contrast on right, and robotic right ureteral reimplant with psoas hitch. Patient tolerated the procedure well.  He was then transferred to the floor after an uneventful PACU stay. Pt D/C 06/02/20 and will follow-up w/ specialist Hollice Espy MD in 1 week./lmb

## 2020-06-04 ENCOUNTER — Telehealth: Payer: Self-pay | Admitting: Radiology

## 2020-06-04 NOTE — Telephone Encounter (Signed)
Patient's wife states patient has urine coming from around catheter and urine is draining into catheter bag without difficulty.  Patient states he felt like he had to urinate and strained. Discouraged patient from straining and explained that it is normal for the catheter to make you feel like you have to urinate. Both patient and wife verbalized understanding.

## 2020-06-05 ENCOUNTER — Telehealth: Payer: Self-pay | Admitting: Radiology

## 2020-06-05 NOTE — Telephone Encounter (Signed)
Wife states patient has dark bloody urine in catheter bag, would not specify exactly what color it was, with 0.5 inch blood clot in bag. States urine is draining well but hematuria has increased since surgery on 06/01/2020. Please advise.

## 2020-06-05 NOTE — Telephone Encounter (Signed)
LMOM to notify patient of Dr Cherrie Gauze note below. Advised patient to return call if he would like to be seen.   It is normal to have some blood in the urine.  As long as the catheter bag is draining, that is a good sign.  He has a ureteral stent and bladder surgery which is very irritating to the bladder lining.   Since it is Friday afternoon, if he feels more comfortable stopping by the office so somebody can take a look and possibly flush the catheter, I am happy to accommodate that.  Hollice Espy, MD

## 2020-06-05 NOTE — Telephone Encounter (Signed)
It is normal to have some blood in the urine.  As long as the catheter bag is draining, that is a good sign.  He has a ureteral stent and bladder surgery which is very irritating to the bladder lining.   Since it is Friday afternoon, if he feels more comfortable stopping by the office so somebody can take a look and possibly flush the catheter, I am happy to accommodate that.  Albert Espy, MD

## 2020-06-05 NOTE — Telephone Encounter (Signed)
error 

## 2020-06-08 ENCOUNTER — Other Ambulatory Visit: Payer: Self-pay

## 2020-06-08 ENCOUNTER — Ambulatory Visit (INDEPENDENT_AMBULATORY_CARE_PROVIDER_SITE_OTHER): Payer: Medicare Other | Admitting: Physician Assistant

## 2020-06-08 DIAGNOSIS — N135 Crossing vessel and stricture of ureter without hydronephrosis: Secondary | ICD-10-CM

## 2020-06-09 ENCOUNTER — Ambulatory Visit
Admission: RE | Admit: 2020-06-09 | Discharge: 2020-06-09 | Disposition: A | Discharge status: Home / Self Care | Payer: Medicare Other | Source: Ambulatory Visit | Attending: Physician Assistant | Admitting: Physician Assistant

## 2020-06-09 ENCOUNTER — Encounter: Payer: Self-pay | Admitting: Physician Assistant

## 2020-06-09 ENCOUNTER — Ambulatory Visit (INDEPENDENT_AMBULATORY_CARE_PROVIDER_SITE_OTHER): Payer: Medicare Other | Admitting: Physician Assistant

## 2020-06-09 VITALS — BP 135/84 | HR 93 | Ht 70.0 in | Wt 181.0 lb

## 2020-06-09 DIAGNOSIS — N135 Crossing vessel and stricture of ureter without hydronephrosis: Secondary | ICD-10-CM | POA: Diagnosis present

## 2020-06-09 DIAGNOSIS — Z9889 Other specified postprocedural states: Secondary | ICD-10-CM | POA: Diagnosis not present

## 2020-06-09 DIAGNOSIS — Z960 Presence of urogenital implants: Secondary | ICD-10-CM

## 2020-06-09 MED ORDER — IOTHALAMATE MEGLUMINE 17.2 % UR SOLN
250.0000 mL | Freq: Once | URETHRAL | Status: AC | PRN
  Administered 2020-06-09: 135 mL via INTRAVESICAL

## 2020-06-09 MED ORDER — OXYBUTYNIN CHLORIDE ER 10 MG PO TB24: 10.0000 mg | ORAL_TABLET | Freq: Every day | ORAL | 1 refills | Status: DC

## 2020-06-09 NOTE — Progress Notes (Signed)
Catheter Removal  Patient is present today for a catheter removal.  30ml of water was drained from the balloon. A 62HU silicone foley cath was removed from the bladder no complications were noted . Patient tolerated well.  Performed by: Debroah Loop, PA-C   Follow up/ Additional notes: Cystogram reveals no leaks following right ureteral reimplant, proceeded with postop catheter removal in clinic today as above. Patient reports some persistent right flank pain following surgery with right ureteral stent in place. Prescribing Ditropan XL 10mg  for stent discomfort with plans for cysto stent removal in 5 weeks (already scheduled).

## 2020-06-10 ENCOUNTER — Telehealth: Payer: Self-pay

## 2020-06-10 NOTE — Telephone Encounter (Signed)
Patient and wife called stating that they were told by the pharmacy that he should clarify if he is to take Vesicare and the oxybutinin he was prescribed yesterday?

## 2020-06-11 ENCOUNTER — Other Ambulatory Visit: Payer: Self-pay | Admitting: Physician Assistant

## 2020-06-11 MED ORDER — OXYBUTYNIN CHLORIDE 5 MG PO TABS: 5.0000 mg | ORAL_TABLET | Freq: Three times a day (TID) | ORAL | 2 refills | Status: DC | PRN

## 2020-06-11 NOTE — Telephone Encounter (Signed)
Pt wife is calling regarding prev. Message she needs a call ASAP regarding this message so patient can start the rx  Please call [phone]

## 2020-06-11 NOTE — Telephone Encounter (Signed)
Attempted to reach patient 3 times. No voicemail set up.

## 2020-06-11 NOTE — Telephone Encounter (Signed)
Please counsel him that I sent in a different dose form of the oxybutynin that he can take with the Verdigris. This form is 5mg  tablets; he can take them up to 3 times daily to treat his stent discomfort.

## 2020-06-16 ENCOUNTER — Encounter: Payer: Self-pay | Admitting: Urology

## 2020-06-16 ENCOUNTER — Encounter: Payer: Self-pay | Admitting: Internal Medicine

## 2020-06-16 MED ORDER — TRAZODONE HCL 50 MG PO TABS: ORAL_TABLET | ORAL | 0 refills | Status: DC

## 2020-06-16 NOTE — Telephone Encounter (Signed)
Left VM to call the office.

## 2020-06-21 ENCOUNTER — Encounter: Payer: Self-pay | Admitting: Urology

## 2020-06-23 NOTE — Telephone Encounter (Signed)
Called patient to discuss symptoms, patient reports symptoms have improved. He is aware he can take Oxybutynin up to three times a day for discomfort. Informed to stay well hydrated and notify the office with any other changes.

## 2020-06-25 NOTE — Progress Notes (Signed)
Patient presented to clinic for Foley removal, however had not obtained cystogram prior. Scheduled cystogram for tomorrow with plans for Foley removal after if no anastomotic leaks.

## 2020-06-29 ENCOUNTER — Encounter: Payer: Self-pay | Admitting: Urology

## 2020-06-30 MED ORDER — OXYBUTYNIN CHLORIDE 5 MG PO TABS: 5.0000 mg | ORAL_TABLET | Freq: Three times a day (TID) | ORAL | 2 refills | Status: DC | PRN

## 2020-07-07 ENCOUNTER — Telehealth: Payer: Medicare Other | Admitting: Internal Medicine

## 2020-07-14 ENCOUNTER — Encounter: Payer: Self-pay | Admitting: Urology

## 2020-07-14 ENCOUNTER — Ambulatory Visit (INDEPENDENT_AMBULATORY_CARE_PROVIDER_SITE_OTHER): Payer: Medicare Other | Admitting: Urology

## 2020-07-14 ENCOUNTER — Other Ambulatory Visit: Payer: Self-pay

## 2020-07-14 VITALS — BP 136/82 | HR 88

## 2020-07-14 DIAGNOSIS — N135 Crossing vessel and stricture of ureter without hydronephrosis: Secondary | ICD-10-CM | POA: Diagnosis not present

## 2020-07-14 LAB — URINALYSIS, COMPLETE
Bilirubin, UA: NEGATIVE
Glucose, UA: NEGATIVE
Ketones, UA: NEGATIVE
Nitrite, UA: NEGATIVE
Specific Gravity, UA: 1.02 (ref 1.005–1.030)
Urobilinogen, Ur: 1 mg/dL (ref 0.2–1.0)
pH, UA: 6.5 (ref 5.0–7.5)

## 2020-07-14 LAB — MICROSCOPIC EXAMINATION
Bacteria, UA: NONE SEEN
RBC, Urine: >30 /HPF — AB (ref 0–2)

## 2020-07-14 MED ORDER — CIPROFLOXACIN HCL 500 MG PO TABS
500.0000 mg | ORAL_TABLET | Freq: Once | ORAL | Status: AC
  Administered 2020-07-14: 500 mg via ORAL

## 2020-07-14 NOTE — Progress Notes (Signed)
   07/14/2020  CC:  Chief Complaint  Patient presents with  . Cysto Stent Removal    HPI: Albert Wade is a 74 y.o. male with right ureteral stricture who presents today for stent removal.   Right ureteral stricture s/p robotic ureteral implant on 06/01/2020.    Blood pressure 136/82, pulse 88. NED. A&Ox3.   No respiratory distress   Abd soft, NT, ND Normal phallus with bilateral descended testicles  Cystoscopy/ Stent removal procedure  Patient identification was confirmed, informed consent was obtained, and patient was prepped using Betadine solution.  Lidocaine jelly was administered per urethral meatus.    Preoperative abx where received prior to procedure (cipro)  Procedure: - Flexible cystoscope introduced, without any difficulty.   - Thorough search of the bladder revealed:    normal urethral meatus  Stent seen emanating from right dome, grasped with stent graspers, and removed in entirety.    Abdominal incision healing nicely.   Post-Procedure: - Patient tolerated the procedure well   Assessment/ Plan:   1. Right Ureteral Stricture Presumed secondary to radiation Right ureteral stricture implant on 06/01/2020.  Warning symptoms reviewed -RUS in 4 weeks  2. Prostate Cancer Continue PSA every 6 moths  3. Severe OAB May improve with stent removal Stop Flomax and oxybutynin.  Continue Vesicare   I, Albert Wade, am acting as a scribe for Dr. Hollice Espy.  I have reviewed the above documentation for accuracy and completeness, and I agree with the above.   Hollice Espy, MD

## 2020-08-06 ENCOUNTER — Other Ambulatory Visit: Payer: Self-pay

## 2020-08-06 ENCOUNTER — Ambulatory Visit
Admission: RE | Admit: 2020-08-06 | Discharge: 2020-08-06 | Disposition: A | Discharge status: Home / Self Care | Payer: Medicare Other | Source: Ambulatory Visit | Attending: Urology | Admitting: Urology

## 2020-08-06 DIAGNOSIS — N131 Hydronephrosis with ureteral stricture, not elsewhere classified: Secondary | ICD-10-CM | POA: Diagnosis not present

## 2020-08-06 DIAGNOSIS — N135 Crossing vessel and stricture of ureter without hydronephrosis: Secondary | ICD-10-CM

## 2020-08-06 DIAGNOSIS — N1330 Unspecified hydronephrosis: Secondary | ICD-10-CM | POA: Diagnosis not present

## 2020-08-10 DIAGNOSIS — M48061 Spinal stenosis, lumbar region without neurogenic claudication: Secondary | ICD-10-CM | POA: Diagnosis not present

## 2020-08-10 DIAGNOSIS — K5909 Other constipation: Secondary | ICD-10-CM | POA: Diagnosis not present

## 2020-08-10 DIAGNOSIS — G894 Chronic pain syndrome: Secondary | ICD-10-CM | POA: Diagnosis not present

## 2020-08-10 DIAGNOSIS — M25579 Pain in unspecified ankle and joints of unspecified foot: Secondary | ICD-10-CM | POA: Diagnosis not present

## 2020-08-10 NOTE — Progress Notes (Signed)
08/11/2020 1:32 PM   Albert Wade 1946-10-09 [id number]  Referring provider: Biagio Borg, MD [address] [place],  [place] Wade [address] Chief Complaint  Patient presents with  . Follow-up    HPI: Albert Wade is a 74 y.o. male returns today for a 4 week follow up of right ureteral strictures, prostate cancer, and severe OAB. Patient is accompanied by his wife.  He status post right ureteral reimplant on 06/01/2020 for severe right distal ureteral radiation stricture.  Pathology is consistent with this, no malignancy.  His stent was removed on 07/14/2020.  He returns today with follow-up renal ultrasound.  RUS on 08/06/2020 revealed normal size kidneys with mild-to-moderate right-sided hydronephrosis.  Patient notes straining with urination but also incontinence at times, OAB symptoms. Denies hematuria. No back or kidney pain.   Abdominal incisions are well-healed.  He is anxious to get back to lawnmowing, etc.  PMH: Past Medical History:  Diagnosis Date  . Acute bronchitis 01/01/2015  . Acute upper respiratory infection 05/19/2016  . Anxiety   . Arthritis    back,wrists,hx. previous fractures  . BPH (benign prostatic hypertrophy)   . BRONCHITIS, CHRONIC 04/29/2008   Qualifier: Diagnosis of  By: Halford Chessman MD, [place]    . CAD 10/16/2009   Qualifier: Diagnosis of  By: Lynden Ang    . CAD (coronary artery disease)    angioplasty   . Chronic back pain   . Chronic bronchitis   . 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 Confusion 04/27/2017  . Depression   . Encounter for preventative adult health care exam with abnormal findings 12/20/2013  . Fever 04/27/2017  . GERD (gastroesophageal reflux disease)   . Gynecomastia 05/12/2015  . H/O hiatal hernia    pts wife states pt does not have   . Heart murmur   . History of kidney stones   . Hypercholesteremia   . Hyperglycemia 05/19/2016  . HYPERLIPIDEMIA 04/29/2008   Qualifier: Diagnosis of  By: Jim Like    . Impaired glucose tolerance 12/20/2013  . Insomnia 04/27/2017  . Lumbosacral radiculopathy 10/14/2016  . MI (myocardial infarction) ([place])    mid-'90's  . MYOCARDIAL INFARCTION 04/29/2008   Qualifier: History of  By: Jim Like    . OSA (obstructive sleep apnea) 05/13/2011   Split night study 2011:  AHI 21/hr with obstructive and central events.  Placed on cpap and titrated to 13cm with reasonable control, but attempts to increase pressure increased the number of central events.  Medium resmed quattro full face mask used.    . Peripheral neuropathy    peripheral neuropathy  . Phimosis/adherent prepuce 10/21/2013  . Pneumonia   . Prostate cancer ([place])   . Shortness of breath 11/05/2010   Qualifier: Diagnosis of  By: Marilynne Halsted, RN, BSN, Jacquelyn    . Sleep apnea    mild-no cpap, unable to tolerate  . Umbilical hernia   . Umbilical hernia without obstruction and without gangrene 05/19/2016  . URI (upper respiratory infection) 04/27/2017  . UTI (urinary tract infection) 12/01/2016  . Varicose veins   . Varicose veins of bilateral lower extremities with other complications 59/16/3846  . Wheezing 01/01/2015    Surgical History: Past Surgical History:  Procedure Laterality Date  . ANGIOPLASTY    . APPENDECTOMY    . BACK SURGERY     x3  . CARDIAC CATHETERIZATION     11'11  . CIRCUMCISION N/A 10/21/2013   Procedure: CIRCUMCISION ADULT;  Surgeon: Shanon Brow  Cy Blamer, MD;  Location: [facility];  Service: Urology;  Laterality: N/A;  . CYSTOSCOPY N/A 10/21/2013   Procedure: CYSTOSCOPY FLEXIBLE;  Surgeon: Bernestine Amass, MD;  Location: [facility];  Service: Urology;  Laterality: N/A;  . CYSTOSCOPY W/ URETERAL STENT PLACEMENT Right 06/01/2020   Procedure: CYSTOSCOPY WITH RETROGRADE PYELOGRAM/URETERAL STENT Exchange;  Surgeon: Hollice Espy, MD;  Location: [facility];  Service: Urology;  Laterality: Right;  . CYSTOSCOPY WITH BIOPSY Right 01/06/2020   Procedure: CYSTOSCOPY WITH BIOPSY;  Surgeon:  Hollice Espy, MD;  Location: [facility];  Service: Urology;  Laterality: Right;  . CYSTOSCOPY/URETEROSCOPY/HOLMIUM LASER/STENT PLACEMENT Right 01/06/2020   Procedure: CYSTOSCOPY/URETEROSCOPY/HOLMIUM LASER/STENT PLACEMENT;  Surgeon: Hollice Espy, MD;  Location: [facility];  Service: Urology;  Laterality: Right;  . HERNIA REPAIR    . INSERTION OF MESH N/A 06/17/2016   Procedure: INSERTION OF MESH;  Surgeon: Excell Seltzer, MD;  [facility];  Service: General;  Laterality: N/A;  . UMBILICAL HERNIA REPAIR N/A 06/17/2016   Procedure: REPAIR UMBILICAL HERNIA WITH MESH;  Surgeon: Excell Seltzer, MD;  [facility];  Service: General;  Laterality: N/A;  . WRIST SURGERY Right     Home Medications:  Allergies as of 08/11/2020      Reactions   Cyclobenzaprine    agitation    Seroquel [quetiapine] Hives      Medication List       Accurate as of August 11, 2020  1:32 PM. If you have any questions, ask your nurse or doctor.        STOP taking these medications   Fluoxetine HCl (PMDD) 20 MG Tabs Stopped by: Hollice Espy, MD     TAKE these medications   Albuterol Sulfate 108 (90 Base) MCG/ACT Aepb Commonly known as: PROAIR RESPICLICK Inhale 2 puffs into the lungs 4 (four) times daily as needed. What changed: reasons to take this   ALPRAZolam 0.5 MG tablet Commonly known as: XANAX Take 0.5 mg by mouth daily as needed for anxiety.   aspirin 81 MG tablet Take 81 mg by mouth daily.   cyclobenzaprine 10 MG tablet Commonly known as: FLEXERIL Take 10 mg by mouth 3 (three) times daily.   DULoxetine 60 MG capsule Commonly known as: CYMBALTA Take 60 mg by mouth 2 (two) times daily.   furosemide 40 MG tablet Commonly known as: LASIX Take 40 mg by mouth daily as needed for edema.   linaclotide 145 MCG Caps capsule Commonly known as: Linzess Take 1 capsule (145 mcg total) by mouth daily before breakfast.   oxyCODONE-acetaminophen 10-325 MG tablet Commonly known as:  PERCOCET Take 1 tablet by mouth every 4 (four) hours as needed for pain. What changed:   how much to take  additional instructions   polyethylene glycol 17 g packet Commonly known as: MIRALAX / GLYCOLAX Take 17 g by mouth daily as needed for moderate constipation.   potassium chloride 10 MEQ tablet Commonly known as: KLOR-CON TAKE 2 TABLETS BY MOUTH PER DAY ON DAYS TAKING FUROSEMIDE   simvastatin 80 MG tablet Commonly known as: ZOCOR TAKE 1 TABLET BY MOUTH EVERY EVENING   solifenacin 10 MG tablet Commonly known as: VESICARE TAKE 1 TABLET BY MOUTH DAILY   traZODone 50 MG tablet Commonly known as: DESYREL Take 1/2 to 1 tablet by mouth at bedtime as needed for sleep       Allergies:  Allergies  Allergen Reactions  . Cyclobenzaprine     agitation   . Seroquel [Quetiapine] Hives  Family History: Family History  Problem Relation Age of Onset  . Prostate cancer Father   . Emphysema Father   . Hyperlipidemia Father   . Hyperlipidemia Mother     Social History:  reports that he quit smoking about 24 years ago. His smoking use included cigarettes. He has a 20.00 pack-year smoking history. He has never used smokeless tobacco. He reports that he does not drink alcohol and does not use drugs.   Physical Exam: BP (!) 152/73   Pulse 74   Wt 183 lb (83 kg)   BMI 26.26 kg/m   Constitutional:  Alert and oriented, No acute distress.  Accompanied by wife today. HEENT: [place] AT, moist mucus membranes.  Trachea midline, no masses. Cardiovascular: No clubbing, cyanosis, or edema. Respiratory: Normal respiratory effort, no increased work of breathing. Skin: No rashes, bruises or suspicious lesions. Neurologic: Grossly intact, no focal deficits, moving all 4 extremities. Psychiatric: Normal mood and affect.  Laboratory Data:  Lab Results  Component Value Date   CREATININE 0.97 06/02/2020    Pertinent Imaging  Results for orders placed during the hospital encounter of  08/06/20  US RENAL  Narrative CLINICAL DATA:  Post right ureteral reimplant.  EXAM: RENAL / URINARY TRACT ULTRASOUND COMPLETE  COMPARISON:  None.  FINDINGS: Right Kidney:  Renal measurements: 10.6 x 6.6 x 6.6 cm = volume: 244 mL . Echogenicity within normal limits. No mass visualized. Mild-to-moderate hydronephrosis is present.  Left Kidney:  Renal measurements: 11.5 x 5.3 x 4.7 cm = volume: 150 mL. Echogenicity within normal limits. No mass or hydronephrosis visualized.  Bladder:  Appears normal for degree of bladder distention.  Other:  None.  IMPRESSION: Normal size kidneys with mild-to-moderate right-sided hydronephrosis.   Electronically Signed By: Marin Olp M.D. On: 08/07/2020 09:30   I have personally reviewed the images and agree with radiologist interpretation.     Assessment & Plan:    1. History of right ureteral strictures/ right hydronephrosis  Persistent/mildly improved hydronephrosis, unclear secondary to recurrent strictures vs chronic hydronephrosis vs reflux. Patient is s/p reimplant Clinically doing well. Asymptomatic.  RUS on 08/06/2020 showed slight improvement.  Recommend lasix renal scan.  2. Prostate Cancer PSA was <0.1 on 04/28/2020. Continue PSA q 6 months  3. Severe OAB  Continue [facility] [address], [place], [place] [address] [phone]  I, Selena Batten, am acting as a scribe for Dr. Hollice Espy.  I have reviewed the above documentation for accuracy and completeness, and I agree with the above.   Hollice Espy, MD

## 2020-08-11 ENCOUNTER — Ambulatory Visit (INDEPENDENT_AMBULATORY_CARE_PROVIDER_SITE_OTHER): Payer: Medicare Other | Admitting: Urology

## 2020-08-11 ENCOUNTER — Other Ambulatory Visit: Payer: Self-pay

## 2020-08-11 VITALS — BP 152/73 | HR 74 | Wt 183.0 lb

## 2020-08-11 DIAGNOSIS — N131 Hydronephrosis with ureteral stricture, not elsewhere classified: Secondary | ICD-10-CM

## 2020-08-11 DIAGNOSIS — N3941 Urge incontinence: Secondary | ICD-10-CM

## 2020-08-11 DIAGNOSIS — N135 Crossing vessel and stricture of ureter without hydronephrosis: Secondary | ICD-10-CM

## 2020-08-11 DIAGNOSIS — C61 Malignant neoplasm of prostate: Secondary | ICD-10-CM

## 2020-08-27 ENCOUNTER — Other Ambulatory Visit: Payer: Self-pay

## 2020-08-27 ENCOUNTER — Ambulatory Visit: Payer: Medicare Other | Admitting: Dermatology

## 2020-08-27 DIAGNOSIS — D1800 Hemangioma unspecified site: Secondary | ICD-10-CM | POA: Diagnosis not present

## 2020-08-27 DIAGNOSIS — L570 Actinic keratosis: Secondary | ICD-10-CM

## 2020-08-27 DIAGNOSIS — D485 Neoplasm of uncertain behavior of skin: Secondary | ICD-10-CM

## 2020-08-27 DIAGNOSIS — L821 Other seborrheic keratosis: Secondary | ICD-10-CM

## 2020-08-27 DIAGNOSIS — D492 Neoplasm of unspecified behavior of bone, soft tissue, and skin: Secondary | ICD-10-CM

## 2020-08-27 DIAGNOSIS — Z1283 Encounter for screening for malignant neoplasm of skin: Secondary | ICD-10-CM | POA: Diagnosis not present

## 2020-08-27 DIAGNOSIS — D229 Melanocytic nevi, unspecified: Secondary | ICD-10-CM

## 2020-08-27 DIAGNOSIS — L578 Other skin changes due to chronic exposure to nonionizing radiation: Secondary | ICD-10-CM

## 2020-08-27 DIAGNOSIS — L820 Inflamed seborrheic keratosis: Secondary | ICD-10-CM | POA: Diagnosis not present

## 2020-08-27 DIAGNOSIS — L814 Other melanin hyperpigmentation: Secondary | ICD-10-CM

## 2020-08-27 NOTE — Patient Instructions (Signed)
Cryotherapy Aftercare  . Wash gently with soap and water everyday.   . Apply Vaseline and Band-Aid daily until healed.  

## 2020-08-27 NOTE — Progress Notes (Signed)
New Patient Visit  Subjective  Albert Wade is a 74 y.o. male who presents for the following: Skin Problem (scaly places on the face and back x 3 years ) and Annual Exam (UBSE ). The patient presents for Upper Body Skin Exam (UBSE) for skin cancer screening and mole check.  Wife with pt who contributes to history  The following portions of the chart were reviewed this encounter and updated as appropriate:  Tobacco  Allergies  Meds  Problems  Med Hx  Surg Hx  Fam Hx     Review of Systems:  No other skin or systemic complaints except as noted in HPI or Assessment and Plan.  Objective  Well appearing patient in no apparent distress; mood and affect are within normal limits.  All skin waist up examined.  Objective  face,arms,ears (22): Erythematous thin papules/macules with gritty scale.   Objective  arms (17): Erythematous keratotic or waxy stuck-on papule or plaque.   Objective  Upper Back spinal: Pink patch    Assessment & Plan    Actinic Damage - diffuse scaly erythematous macules with underlying dyspigmentation - Recommend daily broad spectrum sunscreen SPF 30+ to sun-exposed areas, reapply every 2 hours as needed.  - Call for new or changing lesions.  AK (actinic keratosis) (22) face,arms,ears  May consider PDT or topical treatment in the future   Destruction of lesion - face,arms,ears Complexity: simple   Destruction method: cryotherapy   Informed consent: discussed and consent obtained   Timeout:  patient name, date of birth, surgical site, and procedure verified Lesion destroyed using liquid nitrogen: Yes   Region frozen until ice ball extended beyond lesion: Yes   Outcome: patient tolerated procedure well with no complications   Post-procedure details: wound care instructions given    Inflamed seborrheic keratosis (17) arms  Destruction of lesion - arms Complexity: simple   Destruction method: cryotherapy   Informed consent: discussed  and consent obtained   Timeout:  patient name, date of birth, surgical site, and procedure verified Lesion destroyed using liquid nitrogen: Yes   Region frozen until ice ball extended beyond lesion: Yes   Outcome: patient tolerated procedure well with no complications   Post-procedure details: wound care instructions given    Neoplasm of skin Upper Back spinal  Observe   Recheck at the next office visit   Skin cancer screening  Lentigines - Scattered tan macules - Discussed due to sun exposure - Benign, observe - Call for any changes  Seborrheic Keratoses - Stuck-on, waxy, tan-brown papules and plaques  - Discussed benign etiology and prognosis. - Observe - Call for any changes  Melanocytic Nevi - Tan-brown and/or pink-flesh-colored symmetric macules and papules - Benign appearing on exam today - Observation - Call clinic for new or changing moles - Recommend daily use of broad spectrum spf 30+ sunscreen to sun-exposed areas.   Hemangiomas - Red papules - Discussed benign nature - Observe - Call for any changes  Actinic Damage - diffuse scaly erythematous macules with underlying dyspigmentation - Recommend daily broad spectrum sunscreen SPF 30+ to sun-exposed areas, reapply every 2 hours as needed.  - Call for new or changing lesions.  Skin cancer screening performed today.  Return in about 2 months (around 10/27/2020) for AKs.  IMarye Round, CMA, am acting as scribe for Sarina Ser, MD .  Documentation: I have reviewed the above documentation for accuracy and completeness, and I agree with the above.  Sarina Ser, MD

## 2020-09-01 DIAGNOSIS — M542 Cervicalgia: Secondary | ICD-10-CM | POA: Diagnosis not present

## 2020-09-02 ENCOUNTER — Encounter: Payer: Self-pay | Admitting: Dermatology

## 2020-09-06 ENCOUNTER — Encounter: Payer: Self-pay | Admitting: Internal Medicine

## 2020-09-07 ENCOUNTER — Ambulatory Visit
Admission: RE | Admit: 2020-09-07 | Discharge: 2020-09-07 | Disposition: A | Discharge status: Home / Self Care | Payer: Medicare Other | Source: Ambulatory Visit | Attending: Urology | Admitting: Urology

## 2020-09-07 ENCOUNTER — Other Ambulatory Visit: Payer: Self-pay | Admitting: Internal Medicine

## 2020-09-07 ENCOUNTER — Other Ambulatory Visit: Payer: Self-pay

## 2020-09-07 DIAGNOSIS — N131 Hydronephrosis with ureteral stricture, not elsewhere classified: Secondary | ICD-10-CM | POA: Insufficient documentation

## 2020-09-07 DIAGNOSIS — N1330 Unspecified hydronephrosis: Secondary | ICD-10-CM | POA: Diagnosis not present

## 2020-09-07 DIAGNOSIS — N2889 Other specified disorders of kidney and ureter: Secondary | ICD-10-CM | POA: Diagnosis not present

## 2020-09-07 MED ORDER — SIMVASTATIN 80 MG PO TABS: 80.0000 mg | ORAL_TABLET | Freq: Every evening | ORAL | 1 refills | Status: DC

## 2020-09-07 MED ORDER — TECHNETIUM TC 99M MERTIATIDE
5.0000 | Freq: Once | INTRAVENOUS | Status: AC | PRN
  Administered 2020-09-07: 5.39 via INTRAVENOUS

## 2020-09-07 MED ORDER — FUROSEMIDE 10 MG/ML IJ SOLN
40.0000 mg | Freq: Once | INTRAMUSCULAR | Status: AC
  Administered 2020-09-07: 40 mg via INTRAVENOUS
  Filled 2020-09-07: qty 4

## 2020-09-07 NOTE — Telephone Encounter (Signed)
Sent to Dr. John. 

## 2020-09-07 NOTE — Telephone Encounter (Signed)
Refill done for 1 mo  Please ask pt to make rov for futther refills as is due oct 2021

## 2020-09-08 NOTE — Progress Notes (Signed)
09/09/2020 11:27 AM   Albert Wade [birth year] [id number]  Referring provider: Biagio Borg, MD [address] [place],  [place] [address] Chief Complaint  Patient presents with  . Follow-up    HPI: Albert Wade is a 74 y.o. male who returns for a 1 month follow up of severe OAB, prostate cancer, and history of right ureteral strictures/right hydronephrosis. He is accompanied by his wife.   He status post right ureteral reimplant on 06/01/2020 for severe right distal ureteral radiation stricture.  Pathology is consistent with this, no malignancy.  His stent was removed on 07/14/2020.  RUS on 08/06/2020 revealed normal size kidneys with mild-to-moderate right-sided hydronephrosis.  NM renal imaging flow w/Pharm on 09/07/2020 revealed normal LEFT renogram. Dilated RIGHT renal collecting system though there is good clearance of tracer from the RIGHT kidney following Lasix administration indicating an absence of significant urinary outflow obstruction.  He reports after recent test his stream is weaker. He has difficulty emptying his bladder. He is on vesicare. He has right kidney pain in the mornings. He is drinking a lot of water. Denies hematuria.   His wife is worried vesicare is contributing to his dry eyes and blurry vision.   PMH: Past Medical History:  Diagnosis Date  . Acute bronchitis 01/01/2015  . Acute upper respiratory infection 05/19/2016  . Anxiety   . Arthritis    back,wrists,hx. previous fractures  . BPH (benign prostatic hypertrophy)   . BRONCHITIS, CHRONIC 04/29/2008   Qualifier: Diagnosis of  By: Albert Chessman MD, Albert Wade    . CAD 10/16/2009   Qualifier: Diagnosis of  By: Lynden Ang    . CAD (coronary artery disease)    angioplasty   . Chronic back pain   . Chronic bronchitis   . 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 Confusion 04/27/2017  . Depression   . Encounter for preventative adult health care exam with abnormal findings 12/20/2013    . Fever 04/27/2017  . GERD (gastroesophageal reflux disease)   . Gynecomastia 05/12/2015  . H/O hiatal hernia    pts wife states pt does not have   . Heart murmur   . History of kidney stones   . Hypercholesteremia   . Hyperglycemia 05/19/2016  . HYPERLIPIDEMIA 04/29/2008   Qualifier: Diagnosis of  By: Albert Wade    . Impaired glucose tolerance 12/20/2013  . Insomnia 04/27/2017  . Lumbosacral radiculopathy 10/14/2016  . MI (myocardial infarction) ([place])    mid-'90's  . MYOCARDIAL INFARCTION 04/29/2008   Qualifier: History of  By: Albert Wade    . OSA (obstructive sleep apnea) 05/13/2011   Split night study 2011:  AHI 21/hr with obstructive and central events.  Placed on cpap and titrated to 13cm with reasonable control, but attempts to increase pressure increased the number of central events.  Medium resmed quattro full face mask used.    . Peripheral neuropathy    peripheral neuropathy  . Phimosis/adherent prepuce 10/21/2013  . Pneumonia   . Prostate cancer ([place])   . Shortness of breath 11/05/2010   Qualifier: Diagnosis of  By: Albert Halsted, RN, BSN, Jacquelyn    . Sleep apnea    mild-no cpap, unable to tolerate  . Umbilical hernia   . Umbilical hernia without obstruction and without gangrene 05/19/2016  . URI (upper respiratory infection) 04/27/2017  . UTI (urinary tract infection) 12/01/2016  . Varicose veins   . Varicose veins of bilateral lower extremities with other complications 95/63/8756  . Wheezing  01/01/2015    Surgical History: Past Surgical History:  Procedure Laterality Date  . ANGIOPLASTY    . APPENDECTOMY    . BACK SURGERY     x3  . CARDIAC CATHETERIZATION     11'11  . CIRCUMCISION N/A 10/21/2013   Procedure: CIRCUMCISION ADULT;  Surgeon: Albert Amass, MD;  [facility]: [facility];  Service: Urology;  Laterality: N/A;  . CYSTOSCOPY N/A 10/21/2013   Procedure: CYSTOSCOPY FLEXIBLE;  Surgeon: Albert Amass, MD;  Location: [facility];  Service: Urology;  Laterality:  N/A;  . CYSTOSCOPY W/ URETERAL STENT PLACEMENT Right 06/01/2020   Procedure: CYSTOSCOPY WITH RETROGRADE PYELOGRAM/URETERAL STENT Exchange;  Surgeon: Hollice Espy, MD;  Location: [facility];  Service: Urology;  Laterality: Right;  . CYSTOSCOPY WITH BIOPSY Right 01/06/2020   Procedure: CYSTOSCOPY WITH BIOPSY;  Surgeon: Hollice Espy, MD;  Location: [facility];  Service: Urology;  Laterality: Right;  . CYSTOSCOPY/URETEROSCOPY/HOLMIUM LASER/STENT PLACEMENT Right 01/06/2020   Procedure: CYSTOSCOPY/URETEROSCOPY/HOLMIUM LASER/STENT PLACEMENT;  Surgeon: Hollice Espy, MD;  Location: [facility];  Service: Urology;  Laterality: Right;  . HERNIA REPAIR    . INSERTION OF MESH N/A 06/17/2016   Procedure: INSERTION OF MESH;  Surgeon: Albert Seltzer, MD;  [facility];  Service: General;  Laterality: N/A;  . UMBILICAL HERNIA REPAIR N/A 06/17/2016   Procedure: REPAIR UMBILICAL HERNIA WITH MESH;  Surgeon: Albert Seltzer, MD;  [facility];  Service: General;  Laterality: N/A;  . WRIST SURGERY Right     Home Medications:  Allergies as of 09/09/2020      Reactions   Cyclobenzaprine    agitation    Seroquel [quetiapine] Hives      Medication List       Accurate as of September 09, 2020 11:27 AM. If you have any questions, ask your nurse or doctor.        STOP taking these medications   cyclobenzaprine 10 MG tablet Commonly known as: FLEXERIL Stopped by: Hollice Espy, MD   linaclotide 145 MCG Caps capsule Commonly known as: Linzess Stopped by: Hollice Espy, MD     TAKE these medications   Albuterol Sulfate 108 (90 Base) MCG/ACT Aepb Commonly known as: PROAIR RESPICLICK Inhale 2 puffs into the lungs 4 (four) times daily as needed. What changed: reasons to take this   ALPRAZolam 0.5 MG tablet Commonly known as: XANAX Take 0.5 mg by mouth daily as needed for anxiety.   aspirin 81 MG tablet Take 81 mg by mouth daily.   DULoxetine 60 MG capsule Commonly known as:  CYMBALTA Take 60 mg by mouth 2 (two) times daily.   furosemide 40 MG tablet Commonly known as: LASIX Take 40 mg by mouth daily as needed for edema.   oxyCODONE-acetaminophen 10-325 MG tablet Commonly known as: PERCOCET Take 1 tablet by mouth every 4 (four) hours as needed for pain. What changed:   how much to take  additional instructions   polyethylene glycol 17 g packet Commonly known as: MIRALAX / GLYCOLAX Take 17 g by mouth daily as needed for moderate constipation.   potassium chloride 10 MEQ tablet Commonly known as: KLOR-CON TAKE 2 TABLETS BY MOUTH PER DAY ON DAYS TAKING FUROSEMIDE   simvastatin 80 MG tablet Commonly known as: ZOCOR Take 1 tablet (80 mg total) by mouth every evening.   solifenacin 10 MG tablet Commonly known as: VESICARE TAKE 1 TABLET BY MOUTH DAILY   traZODone 50 MG tablet Commonly known as: DESYREL TAKE 1/2 TO 1 TABLET BY MOUTH AT  BEDTIMEAS NEEDED FOR SLEEP       Allergies:  Allergies  Allergen Reactions  . Cyclobenzaprine     agitation   . Seroquel [Quetiapine] Hives    Family History: Family History  Problem Relation Age of Onset  . Prostate cancer Father   . Emphysema Father   . Hyperlipidemia Father   . Hyperlipidemia Mother     Social History:  reports that he quit smoking about 24 years ago. His smoking use included cigarettes. He has a 20.00 pack-year smoking history. He has never used smokeless tobacco. He reports that he does not drink alcohol and does not use drugs.   Physical Exam: BP (!) 165/75   Pulse 93   Wt 185 lb (83.9 kg)   BMI 26.54 kg/m   Constitutional:  Alert and oriented, No acute distress.  Accompanied by wife. HEENT: [place] AT, moist mucus membranes.  Trachea midline, no masses. Cardiovascular: No clubbing, cyanosis, or edema. Respiratory: Normal respiratory effort, no increased work of breathing. Skin: No rashes, bruises or suspicious lesions. Neurologic: Grossly intact, no focal deficits, moving all  4 extremities. Psychiatric: Normal mood and affect.  Laboratory Data:  Lab Results  Component Value Date   CREATININE 0.97 06/02/2020     Pertinent Imaging: CLINICAL DATA:  RIGHT hydronephrosis post ureteral implantation  EXAM: NUCLEAR MEDICINE RENAL SCAN WITH DIURETIC ADMINISTRATION  TECHNIQUE: Radionuclide angiographic and sequential renal images were obtained after intravenous injection of radiopharmaceutical. Imaging was continued during slow intravenous injection of Lasix approximately 15 minutes after the start of the examination.  RADIOPHARMACEUTICALS:  5.39 mCi Technetium-62m MAG3 IV  Pharmaceutical: Lasix 40 mg IV 20 minutes into exam  COMPARISON:  CT abdomen and pelvis 10/18/2019  FINDINGS: Flow:  Prompt symmetric arterial flow to the kidneys.  Left renogram: Normal uptake, concentration and excretion of tracer by LEFT kidney. Good clearance before and following Lasix administration. Normal time to peak activity of approximately 4.8 minutes with fall to half maximum activity at 13 minutes. No significant retention of tracer at conclusion of exam.  Right renogram: Normal uptake and concentration of tracer by RIGHT kidney. Excretion of tracer into a dilated collecting system. Poor clearance of tracer prior to Lasix but good clearance of tracer is seen following diuretic administration. Analysis of the renogram curve demonstrates a delayed time to peak activity of approximately 23.3 minutes with fall to half maximum activity at 30.5 minutes.  Differential:  Left kidney = 48 %  Right kidney = 52 %  T1/2 post Lasix :  Left kidney = 30.8 min  Right kidney = 32.5 min  IMPRESSION: Normal LEFT renogram.  Dilated RIGHT renal collecting system though there is good clearance of tracer from the RIGHT kidney following Lasix administration indicating an absence of significant urinary outflow obstruction.   Electronically Signed   By: Lavonia Dana M.D.   On: 09/08/2020 08:24  I have personally reviewed the images and agree with radiologist interpretation.     Assessment & Plan:    1. Right distal ureteral strictures  S/p re-implant NM renal imaging flow w/Pharm on 09/07/2020 noted with adequate excretion, no evidence of obstruction at this time  2. Prostate cancer S/p IMRT for high risk cancer PSA is stable. Continue PSA q 6 months No further ADT, competed 3 years  3. Weak urinary stream Patient was on Vesicare for severe OAB symptoms  Advised patient to stop Vesicare. Patient will call clinic for update on status of urinary symptoms.   RTC in  6 months with renal US and PSA.    [facility] [address] [place], [place] [address] [phone]  I, Selena Batten, am acting as a scribe for Dr. Hollice Espy.  I have reviewed the above documentation for accuracy and completeness, and I agree with the above.   Hollice Espy, MD

## 2020-09-09 ENCOUNTER — Other Ambulatory Visit: Payer: Self-pay

## 2020-09-09 ENCOUNTER — Ambulatory Visit (INDEPENDENT_AMBULATORY_CARE_PROVIDER_SITE_OTHER): Payer: Medicare Other | Admitting: Urology

## 2020-09-09 VITALS — BP 165/75 | HR 93 | Wt 185.0 lb

## 2020-09-09 DIAGNOSIS — C61 Malignant neoplasm of prostate: Secondary | ICD-10-CM | POA: Diagnosis not present

## 2020-10-06 ENCOUNTER — Encounter: Payer: Self-pay | Admitting: Internal Medicine

## 2020-10-06 MED ORDER — TRAZODONE HCL 50 MG PO TABS: ORAL_TABLET | ORAL | 0 refills | Status: DC

## 2020-10-07 DIAGNOSIS — K5909 Other constipation: Secondary | ICD-10-CM | POA: Diagnosis not present

## 2020-10-07 DIAGNOSIS — M48061 Spinal stenosis, lumbar region without neurogenic claudication: Secondary | ICD-10-CM | POA: Diagnosis not present

## 2020-10-07 DIAGNOSIS — M25579 Pain in unspecified ankle and joints of unspecified foot: Secondary | ICD-10-CM | POA: Diagnosis not present

## 2020-10-07 DIAGNOSIS — M542 Cervicalgia: Secondary | ICD-10-CM | POA: Diagnosis not present

## 2020-10-07 DIAGNOSIS — G894 Chronic pain syndrome: Secondary | ICD-10-CM | POA: Diagnosis not present

## 2020-10-17 ENCOUNTER — Other Ambulatory Visit: Payer: Self-pay | Admitting: Internal Medicine

## 2020-10-17 NOTE — Telephone Encounter (Signed)
Please refill as per office routine med refill policy (all routine meds refilled for 3 mo or monthly per pt preference up to one year from last visit, then month to month grace period for 3 mo, then further med refills will have to be denied)  

## 2020-10-29 ENCOUNTER — Other Ambulatory Visit: Payer: Self-pay

## 2020-10-29 ENCOUNTER — Ambulatory Visit: Payer: Medicare Other | Admitting: Dermatology

## 2020-10-29 DIAGNOSIS — L820 Inflamed seborrheic keratosis: Secondary | ICD-10-CM | POA: Diagnosis not present

## 2020-10-29 DIAGNOSIS — L570 Actinic keratosis: Secondary | ICD-10-CM | POA: Diagnosis not present

## 2020-10-29 DIAGNOSIS — C44622 Squamous cell carcinoma of skin of right upper limb, including shoulder: Secondary | ICD-10-CM | POA: Diagnosis not present

## 2020-10-29 DIAGNOSIS — L853 Xerosis cutis: Secondary | ICD-10-CM | POA: Diagnosis not present

## 2020-10-29 DIAGNOSIS — L814 Other melanin hyperpigmentation: Secondary | ICD-10-CM

## 2020-10-29 DIAGNOSIS — L578 Other skin changes due to chronic exposure to nonionizing radiation: Secondary | ICD-10-CM

## 2020-10-29 DIAGNOSIS — D492 Neoplasm of unspecified behavior of bone, soft tissue, and skin: Secondary | ICD-10-CM

## 2020-10-29 DIAGNOSIS — C4492 Squamous cell carcinoma of skin, unspecified: Secondary | ICD-10-CM

## 2020-10-29 HISTORY — DX: Squamous cell carcinoma of skin, unspecified: C44.92

## 2020-10-29 NOTE — Patient Instructions (Addendum)
Seborrheic Keratosis  What causes seborrheic keratoses? Seborrheic keratoses are harmless, common skin growths that first appear during adult life.  As time goes by, more growths appear.  Some people may develop a large number of them.  Seborrheic keratoses appear on both covered and uncovered body parts.  They are not caused by sunlight.  The tendency to develop seborrheic keratoses can be inherited.  They vary in color from skin-colored to gray, brown, or even black.  They can be either smooth or have a rough, warty surface.   Seborrheic keratoses are superficial and look as if they were stuck on the skin.  Under the microscope this type of keratosis looks like layers upon layers of skin.  That is why at times the top layer may seem to fall off, but the rest of the growth remains and re-grows.    Treatment Seborrheic keratoses do not need to be treated, but can easily be removed in the office.  Seborrheic keratoses often cause symptoms when they rub on clothing or jewelry.  Lesions can be in the way of shaving.  If they become inflamed, they can cause itching, soreness, or burning.  Removal of a seborrheic keratosis can be accomplished by freezing, burning, or surgery. If any spot bleeds, scabs, or grows rapidly, please return to have it checked, as these can be an indication of a skin cancer.    Wound Care Instructions  1. Cleanse wound gently with soap and water once a day then pat dry with clean gauze. Apply a thing coat of Petrolatum (petroleum jelly, "Vaseline") over the wound (unless you have an allergy to this). We recommend that you use a new, sterile tube of Vaseline. Do not pick or remove scabs. Do not remove the yellow or white "healing tissue" from the base of the wound.  2. Cover the wound with fresh, clean, nonstick gauze and secure with paper tape. You may use Band-Aids in place of gauze and tape if the would is small enough, but would recommend trimming much of the tape off as there  is often too much. Sometimes Band-Aids can irritate the skin.  3. You should call the office for your biopsy report after 1 week if you have not already been contacted.  4. If you experience any problems, such as abnormal amounts of bleeding, swelling, significant bruising, significant pain, or evidence of infection, please call the office immediately.  5. FOR ADULT SURGERY PATIENTS: If you need something for pain relief you may take 1 extra strength Tylenol (acetaminophen) AND 2 Ibuprofen (200mg  each) together every 4 hours as needed for pain. (do not take these if you are allergic to them or if you have a reason you should not take them.) Typically, you may only need pain medication for 1 to 3 days.     Cerave cream for dry skin

## 2020-10-29 NOTE — Progress Notes (Signed)
Follow-Up Visit   Subjective  Albert Wade is a 74 y.o. male who presents for the following: Actinic Keratosis (32m f/u, face, ears, arm LN2 x 22 08/27/20), ISK (57m f/u, arms, LN2 x 17), check spot (back, pts wife concerned about it), and recheck pink patch (31m f/u, upper back spinal).  Patient accompanied by wife and she contributed to history.  The following portions of the chart were reviewed this encounter and updated as appropriate:  Tobacco  Allergies  Meds  Problems  Med Hx  Surg Hx  Fam Hx     Review of Systems:  No other skin or systemic complaints except as noted in HPI or Assessment and Plan.  Objective  Well appearing patient in no apparent distress; mood and affect are within normal limits.  A focused examination was performed including face, ears, arms, back. Relevant physical exam findings are noted in the Assessment and Plan.  Objective  L low back x 2, L neck x 1, L arm x 1 (4): Erythematous keratotic or waxy stuck-on papule or plaque.   Objective  Neck, face x 23, L arm x 2 (25): Pink scaly macules   Objective  Right mid volar forearm: 1.0cm crusted pap   Assessment & Plan    Actinic Damage - chronic, secondary to cumulative UV radiation exposure/sun exposure over time - diffuse scaly erythematous macules with underlying dyspigmentation - Recommend daily broad spectrum sunscreen SPF 30+ to sun-exposed areas, reapply every 2 hours as needed.  - Call for new or changing lesions. - Severe, chronic - Discussed "Field Treatment" for Severe, Confluent Actinic Changes with Pre-Cancerous Actinic Keratoses due to cumulative sun exposure/UV radiation exposure over time Field treatment involves treatment of an entire area of skin that has confluent Actinic Changes (Sun/ Ultraviolet light damage) and PreCancerous Actinic Keratoses by method of PhotoDynamic Therapy (PDT) and/or prescription Topical Chemotherapy agents such as 5-fluorouracil,  5-fluorouracil/calcipotriene, and/or imiquimod.  The purpose is to decrease the number of clinically evident and subclinical PreCancerous lesions to prevent progression to development of skin cancer by chemically destroying early precancer changes that may or may not be visible.  It has been shown to reduce the risk of developing skin cancer in the treated area. As a result of treatment, redness, scaling, crusting, and open sores may occur during treatment course. One or more than one of these methods may be used and may have to be used several times to control, suppress and eliminate the PreCancerous changes. Discussed treatment course, expected reaction, and possible side effects.  Inflamed seborrheic keratosis (4) L low back x 2, L neck x 1, L arm x 1  Destruction of lesion - L low back x 2, L neck x 1, L arm x 1 Complexity: simple   Destruction method: cryotherapy   Informed consent: discussed and consent obtained   Timeout:  patient name, date of birth, surgical site, and procedure verified Lesion destroyed using liquid nitrogen: Yes   Region frozen until ice ball extended beyond lesion: Yes   Outcome: patient tolerated procedure well with no complications   Post-procedure details: wound care instructions given    AK (actinic keratosis) (25) Neck, face x 23, L arm x 2  Recommend PDT with ala to face  Destruction of lesion - Neck, face x 23, L arm x 2 Complexity: simple   Destruction method: cryotherapy   Informed consent: discussed and consent obtained   Timeout:  patient name, date of birth, surgical site, and procedure verified  Lesion destroyed using liquid nitrogen: Yes   Region frozen until ice ball extended beyond lesion: Yes   Outcome: patient tolerated procedure well with no complications   Post-procedure details: wound care instructions given    Neoplasm of skin Right mid volar forearm  Epidermal / dermal shaving  Lesion diameter (cm):  1 Informed consent: discussed  and consent obtained   Timeout: patient name, date of birth, surgical site, and procedure verified   Procedure prep:  Patient was prepped and draped in usual sterile fashion Prep type:  Isopropyl alcohol Anesthesia: the lesion was anesthetized in a standard fashion   Anesthetic:  1% lidocaine w/ epinephrine 1-100,000 buffered w/ 8.4% NaHCO3 Instrument used: flexible razor blade   Hemostasis achieved with: pressure, aluminum chloride and electrodesiccation   Outcome: patient tolerated procedure well   Post-procedure details: sterile dressing applied and wound care instructions given   Dressing type: bandage and petrolatum    Destruction of lesion Complexity: extensive   Destruction method: electrodesiccation and curettage   Informed consent: discussed and consent obtained   Timeout:  patient name, date of birth, surgical site, and procedure verified Procedure prep:  Patient was prepped and draped in usual sterile fashion Prep type:  Isopropyl alcohol Anesthesia: the lesion was anesthetized in a standard fashion   Anesthetic:  1% lidocaine w/ epinephrine 1-100,000 buffered w/ 8.4% NaHCO3 Curettage performed in three different directions: Yes   Electrodesiccation performed over the curetted area: Yes   Lesion length (cm):  1 Lesion width (cm):  1 Margin per side (cm):  0.2 Final wound size (cm):  1.4 Hemostasis achieved with:  pressure, aluminum chloride and electrodesiccation Outcome: patient tolerated procedure well with no complications   Post-procedure details: sterile dressing applied and wound care instructions given   Dressing type: bandage and petrolatum    Specimen 1 - Surgical pathology Differential Diagnosis: R/O SCC Check Margins: No 1.0cm crusted pap EDC today  R/O SCC shave removal and EDC today   Lentigines - Scattered tan macules - Discussed due to sun exposure - Benign, observe - Call for any changes  Xerosis chronic dryness - diffuse xerotic patches -  recommend gentle, hydrating skin care - gentle skin care handout given - recommend Cerave cream; Dove Sensitive Skin cleanser.  Return in about 1 month (around 11/28/2020) for PDT, face, 59m Dr. Rico Ala   I, Othelia Pulling, RMA, am acting as scribe for Sarina Ser, MD .  Documentation: I have reviewed the above documentation for accuracy and completeness, and I agree with the above.  Sarina Ser, MD

## 2020-10-30 ENCOUNTER — Encounter: Payer: Self-pay | Admitting: Dermatology

## 2020-11-02 ENCOUNTER — Telehealth: Payer: Self-pay

## 2020-11-02 NOTE — Telephone Encounter (Signed)
-----   Message from Ralene Bathe, MD sent at 10/30/2020  5:19 PM EDT ----- Skin , right mid volar forearm WELL DIFFERENTIATED SQUAMOUS CELL CARCINOMA WITH SUPERFICIAL INFILTRATION  Cancer - SCC Already treated Recheck next visit

## 2020-11-02 NOTE — Telephone Encounter (Signed)
Advised patient of results/hd  

## 2020-11-03 DIAGNOSIS — M5412 Radiculopathy, cervical region: Secondary | ICD-10-CM | POA: Diagnosis not present

## 2020-11-05 ENCOUNTER — Other Ambulatory Visit: Payer: Self-pay | Admitting: Internal Medicine

## 2020-11-05 ENCOUNTER — Encounter: Payer: Self-pay | Admitting: Internal Medicine

## 2020-11-05 NOTE — Telephone Encounter (Signed)
Please advise 

## 2020-11-06 ENCOUNTER — Ambulatory Visit: Payer: Medicare Other | Admitting: Internal Medicine

## 2020-11-09 ENCOUNTER — Other Ambulatory Visit: Payer: Self-pay

## 2020-11-09 ENCOUNTER — Ambulatory Visit (INDEPENDENT_AMBULATORY_CARE_PROVIDER_SITE_OTHER): Payer: Medicare Other | Admitting: Internal Medicine

## 2020-11-09 ENCOUNTER — Encounter: Payer: Self-pay | Admitting: Internal Medicine

## 2020-11-09 VITALS — BP 130/72 | HR 62 | Temp 98.7°F | Ht 70.0 in | Wt 186.0 lb

## 2020-11-09 DIAGNOSIS — F3342 Major depressive disorder, recurrent, in full remission: Secondary | ICD-10-CM

## 2020-11-09 DIAGNOSIS — R739 Hyperglycemia, unspecified: Secondary | ICD-10-CM

## 2020-11-09 DIAGNOSIS — E538 Deficiency of other specified B group vitamins: Secondary | ICD-10-CM

## 2020-11-09 DIAGNOSIS — R1011 Right upper quadrant pain: Secondary | ICD-10-CM | POA: Diagnosis not present

## 2020-11-09 DIAGNOSIS — K219 Gastro-esophageal reflux disease without esophagitis: Secondary | ICD-10-CM

## 2020-11-09 DIAGNOSIS — Z0001 Encounter for general adult medical examination with abnormal findings: Secondary | ICD-10-CM

## 2020-11-09 DIAGNOSIS — E559 Vitamin D deficiency, unspecified: Secondary | ICD-10-CM | POA: Diagnosis not present

## 2020-11-09 DIAGNOSIS — Z0000 Encounter for general adult medical examination without abnormal findings: Secondary | ICD-10-CM | POA: Diagnosis not present

## 2020-11-09 LAB — PSA: PSA: 0.02 ng/mL — ABNORMAL LOW (ref 0.10–4.00)

## 2020-11-09 LAB — URINALYSIS, ROUTINE W REFLEX MICROSCOPIC
Bilirubin Urine: NEGATIVE
Hgb urine dipstick: NEGATIVE
Ketones, ur: NEGATIVE
Leukocytes,Ua: NEGATIVE
Nitrite: NEGATIVE
Specific Gravity, Urine: 1.02 (ref 1.000–1.030)
Total Protein, Urine: NEGATIVE
Urine Glucose: NEGATIVE
Urobilinogen, UA: 0.2 (ref 0.0–1.0)
pH: 6 (ref 5.0–8.0)

## 2020-11-09 LAB — CBC WITH DIFFERENTIAL/PLATELET
Basophils Absolute: 0.1 10*3/uL (ref 0.0–0.1)
Basophils Relative: 0.7 % (ref 0.0–3.0)
Eosinophils Absolute: 0.1 10*3/uL (ref 0.0–0.7)
Eosinophils Relative: 0.8 % (ref 0.0–5.0)
HCT: 41 % (ref 39.0–52.0)
Hemoglobin: 13.7 g/dL (ref 13.0–17.0)
Lymphocytes Relative: 12.9 % (ref 12.0–46.0)
Lymphs Abs: 1.5 10*3/uL (ref 0.7–4.0)
MCHC: 33.3 g/dL (ref 30.0–36.0)
MCV: 89 fl (ref 78.0–100.0)
Monocytes Absolute: 1.1 10*3/uL — ABNORMAL HIGH (ref 0.1–1.0)
Monocytes Relative: 9.6 % (ref 3.0–12.0)
Neutro Abs: 8.6 10*3/uL — ABNORMAL HIGH (ref 1.4–7.7)
Neutrophils Relative %: 76 % (ref 43.0–77.0)
Platelets: 229 10*3/uL (ref 150.0–400.0)
RBC: 4.6 Mil/uL (ref 4.22–5.81)
RDW: 13.6 % (ref 11.5–15.5)
WBC: 11.3 10*3/uL — ABNORMAL HIGH (ref 4.0–10.5)

## 2020-11-09 LAB — HEMOGLOBIN A1C: Hgb A1c MFr Bld: 6.2 % (ref 4.6–6.5)

## 2020-11-09 LAB — BASIC METABOLIC PANEL WITH GFR
BUN: 20 mg/dL (ref 6–23)
CO2: 33 meq/L — ABNORMAL HIGH (ref 19–32)
Calcium: 9.3 mg/dL (ref 8.4–10.5)
Chloride: 101 meq/L (ref 96–112)
Creatinine, Ser: 1 mg/dL (ref 0.40–1.50)
GFR: 74.04 mL/min
Glucose, Bld: 90 mg/dL (ref 70–99)
Potassium: 4.9 meq/L (ref 3.5–5.1)
Sodium: 141 meq/L (ref 135–145)

## 2020-11-09 LAB — VITAMIN B12: Vitamin B-12: 239 pg/mL (ref 211–911)

## 2020-11-09 LAB — HEPATIC FUNCTION PANEL
ALT: 24 U/L (ref 0–53)
AST: 17 U/L (ref 0–37)
Albumin: 4.6 g/dL (ref 3.5–5.2)
Alkaline Phosphatase: 72 U/L (ref 39–117)
Bilirubin, Direct: 0.1 mg/dL (ref 0.0–0.3)
Total Bilirubin: 0.6 mg/dL (ref 0.2–1.2)
Total Protein: 7.4 g/dL (ref 6.0–8.3)

## 2020-11-09 LAB — TSH: TSH: 1.52 u[IU]/mL (ref 0.35–4.50)

## 2020-11-09 LAB — LIPID PANEL
Cholesterol: 148 mg/dL (ref 0–200)
HDL: 65.9 mg/dL
LDL Cholesterol: 62 mg/dL (ref 0–99)
NonHDL: 81.62
Total CHOL/HDL Ratio: 2
Triglycerides: 96 mg/dL (ref 0.0–149.0)
VLDL: 19.2 mg/dL (ref 0.0–40.0)

## 2020-11-09 LAB — VITAMIN D 25 HYDROXY (VIT D DEFICIENCY, FRACTURES): VITD: 41.98 ng/mL (ref 30.00–100.00)

## 2020-11-09 LAB — LIPASE: Lipase: 33 U/L (ref 11.0–59.0)

## 2020-11-09 NOTE — Patient Instructions (Signed)
Please continue all other medications as before, and refills have been done if requested.  Please have the pharmacy call with any other refills you may need.  Please continue your efforts at being more active, low cholesterol diet, and weight control.  You are otherwise up to date with prevention measures today.  Please keep your appointments with your specialists as you may have planned  You will be contacted regarding the referral for: abdomen ultrasound in [place] if possible  Please go to the LAB at the blood drawing area for the tests to be done  You will be contacted by phone if any changes need to be made immediately.  Otherwise, you will receive a letter about your results with an explanation, but please check with MyChart first.  Please remember to sign up for MyChart if you have not done so, as this will be important to you in the future with finding out test results, communicating by private email, and scheduling acute appointments online when needed.  Please make an Appointment to return in 6 months, or sooner if needed

## 2020-11-09 NOTE — Progress Notes (Signed)
Subjective:    Patient ID: Albert Wade, male    DOB: Dec 12, 1946, 74 y.o.   [name]: [id number]  HPI  Here for wellness and f/u;  Overall doing ok;  Pt denies Chest pain, worsening SOB, DOE, wheezing, orthopnea, PND, worsening LE edema, palpitations, dizziness or syncope.  Pt denies neurological change such as new headache, facial or extremity weakness.  Pt denies polydipsia, polyuria, or low sugar symptoms. Pt states overall good compliance with treatment and medications, good tolerability, and has been trying to follow appropriate diet.  Pt denies worsening depressive symptoms, suicidal ideation or panic. No fever, night sweats, wt loss, loss of appetite, or other constitutional symptoms.  Pt states good ability with ADL's, has low fall risk, home safety reviewed and adequate, no other significant changes in hearing or vision, and only occasionally active with exercise. Also, Denies worsening reflux, abd pain, dysphagia, n/v, bowel change or blood, except for 3 days onset ruq pain, mild intermittent but sore to touch Past Medical History:  Diagnosis Date  . Acute bronchitis 01/01/2015  . Acute upper respiratory infection 05/19/2016  . Anxiety   . Arthritis    back,wrists,hx. previous fractures  . BPH (benign prostatic hypertrophy)   . BRONCHITIS, CHRONIC 04/29/2008   Qualifier: Diagnosis of  By: Halford Chessman MD, [place]    . CAD 10/16/2009   Qualifier: Diagnosis of  By: Lynden Ang    . CAD (coronary artery disease)    angioplasty   . Chronic back pain   . Chronic bronchitis   . 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 Confusion 04/27/2017  . Depression   . Encounter for preventative adult health care exam with abnormal findings 12/20/2013  . Fever 04/27/2017  . GERD (gastroesophageal reflux disease)   . Gynecomastia 05/12/2015  . H/O hiatal hernia    pts wife states pt does not have   . Heart murmur   . History of kidney stones   . Hypercholesteremia   . Hyperglycemia 05/19/2016    . HYPERLIPIDEMIA 04/29/2008   Qualifier: Diagnosis of  By: Jim Like    . Impaired glucose tolerance 12/20/2013  . Insomnia 04/27/2017  . Lumbosacral radiculopathy 10/14/2016  . MI (myocardial infarction) ([place])    mid-'90's  . MYOCARDIAL INFARCTION 04/29/2008   Qualifier: History of  By: Jim Like    . OSA (obstructive sleep apnea) 05/13/2011   Split night study 2011:  AHI 21/hr with obstructive and central events.  Placed on cpap and titrated to 13cm with reasonable control, but attempts to increase pressure increased the number of central events.  Medium resmed quattro full face mask used.    . Peripheral neuropathy    peripheral neuropathy  . Phimosis/adherent prepuce 10/21/2013  . Pneumonia   . Prostate cancer ([place])   . Shortness of breath 11/05/2010   Qualifier: Diagnosis of  By: Marilynne Halsted, RN, BSN, Jacquelyn    . Sleep apnea    mild-no cpap, unable to tolerate  . Squamous cell carcinoma of skin 10/29/2020   right mid volar forearm  . Umbilical hernia   . Umbilical hernia without obstruction and without gangrene 05/19/2016  . URI (upper respiratory infection) 04/27/2017  . UTI (urinary tract infection) 12/01/2016  . Varicose veins   . Varicose veins of bilateral lower extremities with other complications 18/56/3149  . Wheezing 01/01/2015   Past Surgical History:  Procedure Laterality Date  . ANGIOPLASTY    . APPENDECTOMY    . BACK SURGERY  x3  . CARDIAC CATHETERIZATION     11'11  . CIRCUMCISION N/A 10/21/2013   Procedure: CIRCUMCISION ADULT;  Surgeon: Bernestine Amass, MD;  Location: [facility];  Service: Urology;  Laterality: N/A;  . CYSTOSCOPY N/A 10/21/2013   Procedure: CYSTOSCOPY FLEXIBLE;  Surgeon: Bernestine Amass, MD;  Location: [facility];  Service: Urology;  Laterality: N/A;  . CYSTOSCOPY W/ URETERAL STENT PLACEMENT Right 06/01/2020   Procedure: CYSTOSCOPY WITH RETROGRADE PYELOGRAM/URETERAL STENT Exchange;  Surgeon: Hollice Espy, MD;  Location: [facility];   Service: Urology;  Laterality: Right;  . CYSTOSCOPY WITH BIOPSY Right 01/06/2020   Procedure: CYSTOSCOPY WITH BIOPSY;  Surgeon: Hollice Espy, MD;  Location: [facility];  Service: Urology;  Laterality: Right;  . CYSTOSCOPY/URETEROSCOPY/HOLMIUM LASER/STENT PLACEMENT Right 01/06/2020   Procedure: CYSTOSCOPY/URETEROSCOPY/HOLMIUM LASER/STENT PLACEMENT;  Surgeon: Hollice Espy, MD;  Location: [facility];  Service: Urology;  Laterality: Right;  . HERNIA REPAIR    . INSERTION OF MESH N/A 06/17/2016   Procedure: INSERTION OF MESH;  Surgeon: Excell Seltzer, MD;  Location: [facility];  Service: General;  Laterality: N/A;  . UMBILICAL HERNIA REPAIR N/A 06/17/2016   Procedure: REPAIR UMBILICAL HERNIA WITH MESH;  Surgeon: Excell Seltzer, MD;  Location: [facility];  Service: General;  Laterality: N/A;  . WRIST SURGERY Right     reports that he quit smoking about 24 years ago. His smoking use included cigarettes. He has a 20.00 pack-year smoking history. He has never used smokeless tobacco. He reports that he does not drink alcohol and does not use drugs. family history includes Emphysema in his father; Hyperlipidemia in his father and mother; Prostate cancer in his father. Allergies  Allergen Reactions  . Cyclobenzaprine     agitation   . Seroquel [Quetiapine] Hives   . Current Outpatient Medications on File Prior to Visit  Medication Sig Dispense Refill  . albuterol (VENTOLIN HFA) 108 (90 Base) MCG/ACT inhaler INHALE 2 PUFFS INTO THE LUNGS 4 TIMES DAILY AS NEEDED 8.5 g 2  . aspirin 81 MG tablet Take 81 mg by mouth daily.    . busPIRone (BUSPAR) 15 MG tablet Take 15 mg by mouth 2 (two) times daily.    . DULoxetine (CYMBALTA) 60 MG capsule Take 60 mg by mouth 2 (two) times daily.     . furosemide (LASIX) 40 MG tablet Take 40 mg by mouth daily as needed for edema.     . meloxicam (MOBIC) 15 MG tablet Take 15 mg by mouth daily.    Marland Kitchen oxyCODONE-acetaminophen (PERCOCET) 10-325 MG tablet Take 1 tablet by mouth  every 4 (four) hours as needed for pain. (Patient taking differently: Take 1-2 tablets by mouth every 4 (four) hours as needed for pain. Max 8 per day) 30 tablet 0  . potassium chloride (K-DUR) 10 MEQ tablet TAKE 2 TABLETS BY MOUTH PER DAY ON DAYS TAKING FUROSEMIDE 180 tablet 3  . simvastatin (ZOCOR) 80 MG tablet Take 1 tablet (80 mg total) by mouth every evening. 90 tablet 1  . traZODone (DESYREL) 50 MG tablet TAKE ONE-HALF TO ONE TABLET BY MOUTH AT BEDTIME AS NEEDED FOR SLEEP 90 tablet 1   No current facility-administered medications on file prior to visit.   Review of Systems All otherwise neg per pt    Objective:   Physical Exam BP 130/72 (BP Location: Left Arm, Patient Position: Sitting, Cuff Size: Large)   Pulse 62   Temp 98.7 F (37.1 C) (Oral)   Ht 5\' 10"  (1.778 m)   Wt 186  lb (84.4 kg)   SpO2 99%   BMI 26.69 kg/m  VS noted,  Constitutional: Pt appears in NAD HENT: Head: NCAT.  Right Ear: External ear normal.  Left Ear: External ear normal.  Eyes: . Pupils are equal, round, and reactive to light. Conjunctivae and EOM are normal Nose: without d/c or deformity Neck: Neck supple. Gross normal ROM Cardiovascular: Normal rate and regular rhythm.   Pulmonary/Chest: Effort normal and breath sounds without rales or wheezing.  Abd:  Soft, moderate ruq tender,, ND, + BS, no organomegaly Neurological: Pt is alert. At baseline orientation, motor grossly intact Skin: Skin is warm. No rashes, other new lesions, no LE edema Psychiatric: Pt behavior is normal without agitation  All otherwise neg per pt Lab Results  Component Value Date   WBC 11.3 (H) 11/09/2020   HGB 13.7 11/09/2020   HCT 41.0 11/09/2020   PLT 229.0 11/09/2020   GLUCOSE 90 11/09/2020   CHOL 148 11/09/2020   TRIG 96.0 11/09/2020   HDL 65.90 11/09/2020   LDLDIRECT 78.0 05/19/2016   LDLCALC 62 11/09/2020   ALT 24 11/09/2020   AST 17 11/09/2020   NA 141 11/09/2020   K 4.9 11/09/2020   CL 101 11/09/2020    CREATININE 1.00 11/09/2020   BUN 20 11/09/2020   CO2 33 (H) 11/09/2020   TSH 1.52 11/09/2020   PSA 0.02 (L) 11/09/2020   HGBA1C 6.2 11/09/2020      Assessment & Plan:

## 2020-11-11 ENCOUNTER — Ambulatory Visit
Admission: RE | Admit: 2020-11-11 | Discharge: 2020-11-11 | Disposition: A | Discharge status: Home / Self Care | Payer: Medicare Other | Source: Ambulatory Visit | Attending: Internal Medicine | Admitting: Internal Medicine

## 2020-11-11 ENCOUNTER — Other Ambulatory Visit: Payer: Self-pay

## 2020-11-11 DIAGNOSIS — K829 Disease of gallbladder, unspecified: Secondary | ICD-10-CM | POA: Diagnosis not present

## 2020-11-11 DIAGNOSIS — R1011 Right upper quadrant pain: Secondary | ICD-10-CM | POA: Insufficient documentation

## 2020-11-13 ENCOUNTER — Encounter: Payer: Self-pay | Admitting: Internal Medicine

## 2020-11-15 ENCOUNTER — Encounter: Payer: Self-pay | Admitting: Internal Medicine

## 2020-11-15 NOTE — Assessment & Plan Note (Signed)
stable overall by history and exam, recent data reviewed with pt, and pt to continue medical treatment as before,  to f/u any worsening symptoms or concerns  

## 2020-11-15 NOTE — Assessment & Plan Note (Signed)

## 2020-11-15 NOTE — Assessment & Plan Note (Addendum)
?   GB realted, for ruq u/s  I spent 21 minutes in addition to time for CPX wellness examination in preparing to see the patient by review of recent labs, imaging and procedures, obtaining and reviewing separately obtained history, communicating with the patient and family or caregiver, ordering medications, tests or procedures, and documenting clinical information in the EHR including the differential Dx, treatment, and any further evaluation and other management of ruq pain, hyperglycemia, depression, gerd

## 2020-11-25 ENCOUNTER — Ambulatory Visit: Payer: Medicare Other

## 2020-11-25 ENCOUNTER — Other Ambulatory Visit: Payer: Self-pay

## 2020-11-25 DIAGNOSIS — L570 Actinic keratosis: Secondary | ICD-10-CM

## 2020-11-25 MED ORDER — AMINOLEVULINIC ACID HCL 20 % EX SOLR
1.0000 "application " | Freq: Once | CUTANEOUS | Status: AC
  Administered 2020-11-25: 354 mg via TOPICAL

## 2020-11-25 NOTE — Progress Notes (Signed)
Patient completed PDT therapy today.  1. AK (actinic keratosis) Head - Anterior (Face)  Photodynamic therapy - Head - Anterior (Face) Procedure discussed: discussed risks, benefits, side effects. and alternatives   Prep: site scrubbed/prepped with acetone   Location:  Face Number of lesions:  Multiple Type of treatment:  Blue light Aminolevulinic Acid (see MAR for details): Levulan Number of Levulan sticks used:  1 Incubation time (minutes):  60 Number of minutes under lamp:  16 Number of seconds under lamp:  40 Cooling:  Floor fan Outcome: patient tolerated procedure well with no complications   Post-procedure details: sunscreen applied     

## 2020-11-25 NOTE — Patient Instructions (Signed)

## 2020-12-07 DIAGNOSIS — Z79891 Long term (current) use of opiate analgesic: Secondary | ICD-10-CM | POA: Diagnosis not present

## 2020-12-07 DIAGNOSIS — G894 Chronic pain syndrome: Secondary | ICD-10-CM | POA: Diagnosis not present

## 2020-12-07 DIAGNOSIS — M542 Cervicalgia: Secondary | ICD-10-CM | POA: Diagnosis not present

## 2020-12-07 DIAGNOSIS — K5909 Other constipation: Secondary | ICD-10-CM | POA: Diagnosis not present

## 2020-12-07 DIAGNOSIS — Z5181 Encounter for therapeutic drug level monitoring: Secondary | ICD-10-CM | POA: Diagnosis not present

## 2020-12-07 DIAGNOSIS — M48061 Spinal stenosis, lumbar region without neurogenic claudication: Secondary | ICD-10-CM | POA: Diagnosis not present

## 2020-12-07 DIAGNOSIS — M25579 Pain in unspecified ankle and joints of unspecified foot: Secondary | ICD-10-CM | POA: Diagnosis not present

## 2021-01-15 ENCOUNTER — Ambulatory Visit: Payer: Medicare Other | Admitting: Internal Medicine

## 2021-01-28 DIAGNOSIS — K5909 Other constipation: Secondary | ICD-10-CM | POA: Diagnosis not present

## 2021-01-28 DIAGNOSIS — M25579 Pain in unspecified ankle and joints of unspecified foot: Secondary | ICD-10-CM | POA: Diagnosis not present

## 2021-01-28 DIAGNOSIS — M48061 Spinal stenosis, lumbar region without neurogenic claudication: Secondary | ICD-10-CM | POA: Diagnosis not present

## 2021-01-28 DIAGNOSIS — G894 Chronic pain syndrome: Secondary | ICD-10-CM | POA: Diagnosis not present

## 2021-02-03 ENCOUNTER — Encounter
Admission: EM | Disposition: A | Discharge status: Home / Self Care | Payer: Self-pay | Source: Home / Self Care | Attending: Emergency Medicine

## 2021-02-03 ENCOUNTER — Emergency Department: Payer: Medicare Other | Admitting: Anesthesiology

## 2021-02-03 ENCOUNTER — Emergency Department: Payer: Medicare Other

## 2021-02-03 ENCOUNTER — Encounter: Payer: Self-pay | Admitting: Emergency Medicine

## 2021-02-03 ENCOUNTER — Other Ambulatory Visit: Payer: Self-pay

## 2021-02-03 ENCOUNTER — Ambulatory Visit
Admission: EM | Admit: 2021-02-03 | Discharge: 2021-02-03 | Disposition: A | Discharge status: Home / Self Care | Payer: Medicare Other | Attending: Gastroenterology | Admitting: Gastroenterology

## 2021-02-03 DIAGNOSIS — T182XXA Foreign body in stomach, initial encounter: Secondary | ICD-10-CM

## 2021-02-03 DIAGNOSIS — K56609 Unspecified intestinal obstruction, unspecified as to partial versus complete obstruction: Secondary | ICD-10-CM

## 2021-02-03 DIAGNOSIS — Z791 Long term (current) use of non-steroidal anti-inflammatories (NSAID): Secondary | ICD-10-CM | POA: Insufficient documentation

## 2021-02-03 DIAGNOSIS — Z8042 Family history of malignant neoplasm of prostate: Secondary | ICD-10-CM | POA: Diagnosis not present

## 2021-02-03 DIAGNOSIS — Z825 Family history of asthma and other chronic lower respiratory diseases: Secondary | ICD-10-CM | POA: Diagnosis not present

## 2021-02-03 DIAGNOSIS — Z87891 Personal history of nicotine dependence: Secondary | ICD-10-CM | POA: Insufficient documentation

## 2021-02-03 DIAGNOSIS — R1310 Dysphagia, unspecified: Secondary | ICD-10-CM | POA: Diagnosis not present

## 2021-02-03 DIAGNOSIS — Z8546 Personal history of malignant neoplasm of prostate: Secondary | ICD-10-CM | POA: Diagnosis not present

## 2021-02-03 DIAGNOSIS — G4733 Obstructive sleep apnea (adult) (pediatric): Secondary | ICD-10-CM | POA: Diagnosis not present

## 2021-02-03 DIAGNOSIS — Z20822 Contact with and (suspected) exposure to covid-19: Secondary | ICD-10-CM | POA: Diagnosis not present

## 2021-02-03 DIAGNOSIS — I252 Old myocardial infarction: Secondary | ICD-10-CM | POA: Insufficient documentation

## 2021-02-03 DIAGNOSIS — K219 Gastro-esophageal reflux disease without esophagitis: Secondary | ICD-10-CM | POA: Diagnosis not present

## 2021-02-03 DIAGNOSIS — Z7982 Long term (current) use of aspirin: Secondary | ICD-10-CM | POA: Insufficient documentation

## 2021-02-03 DIAGNOSIS — T18128A Food in esophagus causing other injury, initial encounter: Secondary | ICD-10-CM | POA: Diagnosis not present

## 2021-02-03 DIAGNOSIS — R0602 Shortness of breath: Secondary | ICD-10-CM | POA: Diagnosis not present

## 2021-02-03 DIAGNOSIS — Z79899 Other long term (current) drug therapy: Secondary | ICD-10-CM | POA: Insufficient documentation

## 2021-02-03 DIAGNOSIS — K449 Diaphragmatic hernia without obstruction or gangrene: Secondary | ICD-10-CM | POA: Diagnosis not present

## 2021-02-03 DIAGNOSIS — Z888 Allergy status to other drugs, medicaments and biological substances status: Secondary | ICD-10-CM | POA: Diagnosis not present

## 2021-02-03 DIAGNOSIS — S2231XA Fracture of one rib, right side, initial encounter for closed fracture: Secondary | ICD-10-CM | POA: Diagnosis not present

## 2021-02-03 DIAGNOSIS — K209 Esophagitis, unspecified without bleeding: Secondary | ICD-10-CM

## 2021-02-03 DIAGNOSIS — Z85828 Personal history of other malignant neoplasm of skin: Secondary | ICD-10-CM | POA: Insufficient documentation

## 2021-02-03 DIAGNOSIS — E785 Hyperlipidemia, unspecified: Secondary | ICD-10-CM | POA: Diagnosis not present

## 2021-02-03 DIAGNOSIS — Z981 Arthrodesis status: Secondary | ICD-10-CM | POA: Diagnosis not present

## 2021-02-03 DIAGNOSIS — Z8349 Family history of other endocrine, nutritional and metabolic diseases: Secondary | ICD-10-CM | POA: Diagnosis not present

## 2021-02-03 DIAGNOSIS — X58XXXA Exposure to other specified factors, initial encounter: Secondary | ICD-10-CM | POA: Diagnosis not present

## 2021-02-03 DIAGNOSIS — R0989 Other specified symptoms and signs involving the circulatory and respiratory systems: Secondary | ICD-10-CM | POA: Diagnosis not present

## 2021-02-03 DIAGNOSIS — I2510 Atherosclerotic heart disease of native coronary artery without angina pectoris: Secondary | ICD-10-CM | POA: Diagnosis not present

## 2021-02-03 HISTORY — PX: ESOPHAGOGASTRODUODENOSCOPY: SHX5428

## 2021-02-03 LAB — CBC WITH DIFFERENTIAL/PLATELET
Abs Immature Granulocytes: 0.03 10*3/uL (ref 0.00–0.07)
Basophils Absolute: 0 10*3/uL (ref 0.0–0.1)
Basophils Relative: 1 %
Eosinophils Absolute: 0.1 10*3/uL (ref 0.0–0.5)
Eosinophils Relative: 1 %
HCT: 40.1 % (ref 39.0–52.0)
Hemoglobin: 13.9 g/dL (ref 13.0–17.0)
Immature Granulocytes: 0 %
Lymphocytes Relative: 12 %
Lymphs Abs: 1 10*3/uL (ref 0.7–4.0)
MCH: 30.9 pg (ref 26.0–34.0)
MCHC: 34.7 g/dL (ref 30.0–36.0)
MCV: 89.1 fL (ref 80.0–100.0)
Monocytes Absolute: 0.5 10*3/uL (ref 0.1–1.0)
Monocytes Relative: 6 %
Neutro Abs: 6.3 10*3/uL (ref 1.7–7.7)
Neutrophils Relative %: 80 %
Platelets: 234 10*3/uL (ref 150–400)
RBC: 4.5 MIL/uL (ref 4.22–5.81)
RDW: 12.2 % (ref 11.5–15.5)
WBC: 7.9 10*3/uL (ref 4.0–10.5)
nRBC: 0 % (ref 0.0–0.2)

## 2021-02-03 LAB — COMPREHENSIVE METABOLIC PANEL WITH GFR
ALT: 16 U/L (ref 0–44)
AST: 22 U/L (ref 15–41)
Albumin: 4.5 g/dL (ref 3.5–5.0)
Alkaline Phosphatase: 79 U/L (ref 38–126)
Anion gap: 13 (ref 5–15)
BUN: 13 mg/dL (ref 8–23)
CO2: 27 mmol/L (ref 22–32)
Calcium: 9.7 mg/dL (ref 8.9–10.3)
Chloride: 103 mmol/L (ref 98–111)
Creatinine, Ser: 0.92 mg/dL (ref 0.61–1.24)
GFR, Estimated: >60 mL/min
Glucose, Bld: 159 mg/dL — ABNORMAL HIGH (ref 70–99)
Potassium: 3.9 mmol/L (ref 3.5–5.1)
Sodium: 143 mmol/L (ref 135–145)
Total Bilirubin: 0.7 mg/dL (ref 0.3–1.2)
Total Protein: 8.2 g/dL — ABNORMAL HIGH (ref 6.5–8.1)

## 2021-02-03 LAB — TROPONIN I (HIGH SENSITIVITY): Troponin I (High Sensitivity): 6 ng/L

## 2021-02-03 LAB — RESP PANEL BY RT-PCR (FLU A&B, COVID) ARPGX2
Influenza A by PCR: NEGATIVE
Influenza B by PCR: NEGATIVE
SARS Coronavirus 2 by RT PCR: NEGATIVE

## 2021-02-03 SURGERY — ESOPHAGOGASTRODUODENOSCOPY (EGD)
Anesthesia: General

## 2021-02-03 MED ORDER — LIDOCAINE HCL (CARDIAC) PF 100 MG/5ML IV SOSY
PREFILLED_SYRINGE | INTRAVENOUS | Status: DC | PRN
  Administered 2021-02-03: 100 mg via INTRAVENOUS

## 2021-02-03 MED ORDER — GLUCAGON HCL RDNA (DIAGNOSTIC) 1 MG IJ SOLR
INTRAMUSCULAR | Status: AC
  Filled 2021-02-03: qty 1

## 2021-02-03 MED ORDER — FENTANYL CITRATE (PF) 100 MCG/2ML IJ SOLN
INTRAMUSCULAR | Status: AC
  Filled 2021-02-03: qty 2

## 2021-02-03 MED ORDER — PROPOFOL 10 MG/ML IV BOLUS
INTRAVENOUS | Status: DC | PRN
  Administered 2021-02-03: 150 mg via INTRAVENOUS

## 2021-02-03 MED ORDER — GLUCAGON HCL (RDNA) 1 MG IJ SOLR
1.0000 mg | Freq: Once | INTRAMUSCULAR | Status: AC
  Administered 2021-02-03: 1 mg via INTRAVENOUS
  Filled 2021-02-03: qty 1

## 2021-02-03 MED ORDER — METOCLOPRAMIDE HCL 5 MG/ML IJ SOLN
10.0000 mg | Freq: Once | INTRAMUSCULAR | Status: AC
  Administered 2021-02-03: 10 mg via INTRAVENOUS
  Filled 2021-02-03: qty 2

## 2021-02-03 MED ORDER — LACTATED RINGERS IV BOLUS
500.0000 mL | Freq: Once | INTRAVENOUS | Status: AC
  Administered 2021-02-03: 500 mL via INTRAVENOUS

## 2021-02-03 MED ORDER — PROPOFOL 10 MG/ML IV BOLUS
INTRAVENOUS | Status: AC
  Filled 2021-02-03: qty 20

## 2021-02-03 MED ORDER — DEXAMETHASONE SODIUM PHOSPHATE 10 MG/ML IJ SOLN
INTRAMUSCULAR | Status: DC | PRN
  Administered 2021-02-03: 5 mg via INTRAVENOUS

## 2021-02-03 MED ORDER — EPHEDRINE SULFATE 50 MG/ML IJ SOLN
INTRAMUSCULAR | Status: DC | PRN
  Administered 2021-02-03: 10 mg via INTRAVENOUS
  Administered 2021-02-03: 5 mg via INTRAVENOUS

## 2021-02-03 MED ORDER — ONDANSETRON HCL 4 MG/2ML IJ SOLN
INTRAMUSCULAR | Status: DC | PRN
  Administered 2021-02-03: 4 mg via INTRAVENOUS

## 2021-02-03 MED ORDER — LIDOCAINE HCL (PF) 2 % IJ SOLN
INTRAMUSCULAR | Status: AC
  Filled 2021-02-03: qty 5

## 2021-02-03 MED ORDER — SUCCINYLCHOLINE CHLORIDE 20 MG/ML IJ SOLN
INTRAMUSCULAR | Status: DC | PRN
  Administered 2021-02-03: 180 mg via INTRAVENOUS

## 2021-02-03 MED ORDER — MORPHINE SULFATE (PF) 4 MG/ML IV SOLN
4.0000 mg | Freq: Once | INTRAVENOUS | Status: AC
  Administered 2021-02-03: 4 mg via INTRAVENOUS
  Filled 2021-02-03: qty 1

## 2021-02-03 MED ORDER — SODIUM CHLORIDE 0.9 % IV SOLN
INTRAVENOUS | Status: DC
  Administered 2021-02-03: 10 mL/h via INTRAVENOUS

## 2021-02-03 MED ORDER — FENTANYL CITRATE (PF) 100 MCG/2ML IJ SOLN
INTRAMUSCULAR | Status: DC | PRN
  Administered 2021-02-03: 25 ug via INTRAVENOUS

## 2021-02-03 MED ORDER — SUCCINYLCHOLINE CHLORIDE 200 MG/10ML IV SOSY
PREFILLED_SYRINGE | INTRAVENOUS | Status: AC
  Filled 2021-02-03: qty 10

## 2021-02-03 MED ORDER — OMEPRAZOLE 20 MG PO CPDR: 20.0000 mg | DELAYED_RELEASE_CAPSULE | Freq: Two times a day (BID) | ORAL | 1 refills | Status: DC

## 2021-02-03 MED ORDER — PHENYLEPHRINE HCL (PRESSORS) 10 MG/ML IV SOLN
INTRAVENOUS | Status: DC | PRN
  Administered 2021-02-03 (×4): 100 ug via INTRAVENOUS

## 2021-02-03 NOTE — ED Notes (Signed)
Pt arrives from waiting room w/c escorted by triage nurse and visitor.

## 2021-02-03 NOTE — Op Note (Signed)
[facility] Patient Name: Albert Wade Procedure Date: 02/03/2021 12:22 PM [name]: [id number] Account #: [id number] Date of Birth: October 13, 1946 Admit Type: Inpatient Age: 75 Room: [facility] ENDO ROOM 3 Gender: Male Note Status: Finalized Procedure:             Upper GI endoscopy Indications:           Foreign body in the esophagus Providers:             Jonathon Bellows MD, MD Referring MD:          Biagio Borg, MD (Referring MD) Medicines:             General Anesthesia, Monitored Anesthesia Care Complications:         No immediate complications. Procedure:             Pre-Anesthesia Assessment:                        - Prior to the procedure, a History and Physical was                         performed, and patient medications, allergies and                         sensitivities were reviewed. The patient's tolerance                         of previous anesthesia was reviewed.                        - The risks and benefits of the procedure and the                         sedation options and risks were discussed with the                         patient. All questions were answered and informed                         consent was obtained.                        - ASA Grade Assessment: III - A patient with severe                         systemic disease.                        After obtaining informed consent, the endoscope was                         passed under direct vision. Throughout the procedure,                         the patient's blood pressure, pulse, and oxygen                         saturations were monitored continuously. The Endoscope                         was  introduced through the mouth, and advanced to the                         third part of duodenum. The upper GI endoscopy was                         performed with moderate difficulty due to presence of                         food. The patient tolerated the procedure  well. Findings:      Food was found at the gastroesophageal junction. Removal of food was       accomplished.      A large amount of food (residue) was found in the gastric fundus.      The examined duodenum was normal.      The cardia and gastric fundus were normal on retroflexion.      LA Grade C (one or more mucosal breaks continuous between tops of 2 or       more mucosal folds, less than 75% circumference) esophagitis with no       bleeding was found in the lower third of the esophagus. Impression:            - Food at the gastroesophageal junction. Removal was                         successful.                        - A large amount of food (residue) in the stomach.                        - Normal examined duodenum.                        - LA Grade C esophagitis with no bleeding. Recommendation:        - Discharge patient to home (with escort).                        - Mechanical soft diet for 6 weeks.                        - Use Prilosec (omeprazole) 40 mg PO BID for 8 weeks.                        - Return to my office in 2 weeks.                        - Repeat upper endoscopy in 4 weeks to evaluate the                         response to therapy. Procedure Code(s):     --- Professional ---                        [phone], Esophagogastroduodenoscopy, flexible,                         transoral; with removal of foreign body(s) Diagnosis Code(s):     --- Professional ---  Y48.250I, Food in esophagus causing other injury,                         initial encounter                        T18.2XXA, Foreign body in stomach, initial encounter                        K20.90, Esophagitis, unspecified without bleeding                        T18.108A, Unspecified foreign body in esophagus                         causing other injury, initial encounter CPT copyright 2019 [employer]. All rights reserved. The codes documented in this report are  preliminary and upon coder review may  be revised to meet current compliance requirements. Jonathon Bellows, MD Jonathon Bellows MD, MD 02/03/2021 1:26:17 PM This report has been signed electronically. Number of Addenda: 0 Note Initiated On: 02/03/2021 12:22 PM Estimated Blood Loss:  Estimated blood loss: none.      [facility]

## 2021-02-03 NOTE — Consult Note (Signed)
Albert Wade , MD [address] [place], [place], [place], [address] [id number] [place], [place], [place], [address] Phone: [phone]  Fax: [phone]  Consultation  Referring Provider:     Dr. Merrilee Jansky Care Physician:  Biagio Borg, MD Primary Gastroenterologist: None         Reason for Consultation:     Food impaction  Date of Admission:  02/03/2021 Date of Consultation:  02/03/2021         HPI:   Albert Wade is a 75 y.o. male presented to the emergency room this morning with inability to swallow after eating some steak yesterday afternoon.  Unable to maintain secretions.  I was informed at around 7 AM.  He had not yet had his Covid test.  But x-rays showed no abnormalities.  He says unable to swallow since 5 pm yesterday , h/o gradual dysphagia. No prior endoscopy no recent weight loss rather weight gain.   Past Medical History:  Diagnosis Date  . Acute bronchitis 01/01/2015  . Acute upper respiratory infection 05/19/2016  . Anxiety   . Arthritis    back,wrists,hx. previous fractures  . BPH (benign prostatic hypertrophy)   . BRONCHITIS, CHRONIC 04/29/2008   Qualifier: Diagnosis of  By: Halford Chessman MD, Vineet    . CAD 10/16/2009   Qualifier: Diagnosis of  By: Lynden Ang    . CAD (coronary artery disease)    angioplasty   . Chronic back pain   . Chronic bronchitis   . 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 Confusion 04/27/2017  . Depression   . Encounter for preventative adult health care exam with abnormal findings 12/20/2013  . Fever 04/27/2017  . GERD (gastroesophageal reflux disease)   . Gynecomastia 05/12/2015  . H/O hiatal hernia    pts wife states pt does not have   . Heart murmur   . History of kidney stones   . Hypercholesteremia   . Hyperglycemia 05/19/2016  . HYPERLIPIDEMIA 04/29/2008   Qualifier: Diagnosis of  By: Jim Like    . Impaired glucose tolerance 12/20/2013  . Insomnia 04/27/2017  . Lumbosacral radiculopathy  10/14/2016  . MI (myocardial infarction) ([place])    mid-'90's  . MYOCARDIAL INFARCTION 04/29/2008   Qualifier: History of  By: Jim Like    . OSA (obstructive sleep apnea) 05/13/2011   Split night study 2011:  AHI 21/hr with obstructive and central events.  Placed on cpap and titrated to 13cm with reasonable control, but attempts to increase pressure increased the number of central events.  Medium resmed quattro full face mask used.    . Peripheral neuropathy    peripheral neuropathy  . Phimosis/adherent prepuce 10/21/2013  . Pneumonia   . Prostate cancer ([place])   . Shortness of breath 11/05/2010   Qualifier: Diagnosis of  By: Marilynne Halsted, RN, BSN, Jacquelyn    . Sleep apnea    mild-no cpap, unable to tolerate  . Squamous cell carcinoma of skin 10/29/2020   right mid volar forearm  . Umbilical hernia   . Umbilical hernia without obstruction and without gangrene 05/19/2016  . URI (upper respiratory infection) 04/27/2017  . UTI (urinary tract infection) 12/01/2016  . Varicose veins   . Varicose veins of bilateral lower extremities with other complications 78/67/6720  . Wheezing 01/01/2015    Past Surgical History:  Procedure Laterality Date  . ANGIOPLASTY    . APPENDECTOMY    . BACK SURGERY     x3  .  CARDIAC CATHETERIZATION     11'11  . CIRCUMCISION N/A 10/21/2013   Procedure: CIRCUMCISION ADULT;  Surgeon: Bernestine Amass, MD;  Location: [facility];  Service: Urology;  Laterality: N/A;  . CYSTOSCOPY N/A 10/21/2013   Procedure: CYSTOSCOPY FLEXIBLE;  Surgeon: Bernestine Amass, MD;  Location: [facility];  Service: Urology;  Laterality: N/A;  . CYSTOSCOPY W/ URETERAL STENT PLACEMENT Right 06/01/2020   Procedure: CYSTOSCOPY WITH RETROGRADE PYELOGRAM/URETERAL STENT Exchange;  Surgeon: Hollice Espy, MD;  Location: [facility];  Service: Urology;  Laterality: Right;  . CYSTOSCOPY WITH BIOPSY Right 01/06/2020   Procedure: CYSTOSCOPY WITH BIOPSY;  Surgeon: Hollice Espy, MD;  Location: [facility];  Service:  Urology;  Laterality: Right;  . CYSTOSCOPY/URETEROSCOPY/HOLMIUM LASER/STENT PLACEMENT Right 01/06/2020   Procedure: CYSTOSCOPY/URETEROSCOPY/HOLMIUM LASER/STENT PLACEMENT;  Surgeon: Hollice Espy, MD;  Location: [facility];  Service: Urology;  Laterality: Right;  . HERNIA REPAIR    . INSERTION OF MESH N/A 06/17/2016   Procedure: INSERTION OF MESH;  Surgeon: Excell Seltzer, MD;  Location: [facility];  Service: General;  Laterality: N/A;  . UMBILICAL HERNIA REPAIR N/A 06/17/2016   Procedure: REPAIR UMBILICAL HERNIA WITH MESH;  Surgeon: Excell Seltzer, MD;  Location: [facility];  Service: General;  Laterality: N/A;  . WRIST SURGERY Right     Prior to Admission medications   Medication Sig Start Date End Date Taking? Authorizing Provider  albuterol (VENTOLIN HFA) 108 (90 Base) MCG/ACT inhaler INHALE 2 PUFFS INTO THE LUNGS 4 TIMES DAILY AS NEEDED 10/19/20   Biagio Borg, MD  aspirin 81 MG tablet Take 81 mg by mouth daily.    [provider]  busPIRone (BUSPAR) 15 MG tablet Take 15 mg by mouth 2 (two) times daily. 10/07/20   [provider]  DULoxetine (CYMBALTA) 60 MG capsule Take 60 mg by mouth 2 (two) times daily.  04/23/20   [provider]  furosemide (LASIX) 40 MG tablet Take 40 mg by mouth daily as needed for edema.     [provider]  meloxicam (MOBIC) 15 MG tablet Take 15 mg by mouth daily. 10/07/20   [provider]  oxyCODONE-acetaminophen (PERCOCET) 10-325 MG tablet Take 1 tablet by mouth every 4 (four) hours as needed for pain. Patient taking differently: Take 1-2 tablets by mouth every 4 (four) hours as needed for pain. Max 8 per day 01/06/20   Hollice Espy, MD  potassium chloride (K-DUR) 10 MEQ tablet TAKE 2 TABLETS BY MOUTH PER DAY ON DAYS TAKING FUROSEMIDE 09/10/18   Biagio Borg, MD  simvastatin (ZOCOR) 80 MG tablet Take 1 tablet (80 mg total) by mouth every evening. 09/07/20   Biagio Borg, MD  traZODone (DESYREL) 50 MG tablet TAKE  ONE-HALF TO ONE TABLET BY MOUTH AT BEDTIME AS NEEDED FOR SLEEP 11/05/20   Biagio Borg, MD    Family History  Problem Relation Age of Onset  . Prostate cancer Father   . Emphysema Father   . Hyperlipidemia Father   . Hyperlipidemia Mother      Social History   Tobacco Use  . Smoking status: Former Smoker    Packs/day: 0.50    Years: 40.00    Pack years: 20.00    Types: Cigarettes    Quit date: 12/27/1995    Years since quitting: 25.1  . Smokeless tobacco: Never Used  Vaping Use  . Vaping Use: Never used  Substance Use Topics  . Alcohol use: No  . Drug use: No    Allergies  as of 02/03/2021 - Review Complete 02/03/2021  Allergen Reaction Noted  . Cyclobenzaprine  05/11/2020  . Seroquel [quetiapine] Hives 06/01/2015    Review of Systems:    All systems reviewed and negative except where noted in HPI.   Physical Exam:  Vital signs in last 24 hours: Temp:  [97.9 F (36.6 C)] 97.9 F (36.6 C) (02/09 0534) Pulse Rate:  [62-71] 71 (02/09 0800) Resp:  [15-20] 15 (02/09 0800) BP: (145-196)/(78-89) 196/78 (02/09 0800) SpO2:  [99 %-100 %] 100 % (02/09 0800) Weight:  [83.9 kg] 83.9 kg (02/09 0532)   General:   Pleasant, cooperative in NAD Head:  Normocephalic and atraumatic. Eyes:   No icterus.   Conjunctiva pink. PERRLA. Ears:  Normal auditory acuity. Neck:  Supple; no masses or thyroidomegaly Lungs: Respirations even and unlabored. Lungs clear to auscultation bilaterally.   No wheezes, crackles, or rhonchi.  Heart:  Regular rate and rhythm;  Without murmur, clicks, rubs or gallops Abdomen:  Soft, nondistended, nontender. Normal bowel sounds. No appreciable masses or hepatomegaly.  No rebound or guarding.  Neurologic:  Alert and oriented x3;  grossly normal neurologically. Skin:  Intact without significant lesions or rashes. Cervical Nodes:  No significant cervical adenopathy. Psych:  Alert and cooperative. Normal affect.  LAB RESULTS: Recent Labs    02/03/21 0544   WBC 7.9  HGB 13.9  HCT 40.1  PLT 234   BMET Recent Labs    02/03/21 0544  NA 143  K 3.9  CL 103  CO2 27  GLUCOSE 159*  BUN 13  CREATININE 0.92  CALCIUM 9.7   LFT Recent Labs    02/03/21 0544  PROT 8.2*  ALBUMIN 4.5  AST 22  ALT 16  ALKPHOS 79  BILITOT 0.7   PT/INR No results for input(s): LABPROT, INR in the last 72 hours.  STUDIES: DG Neck Soft Tissue  Result Date: 02/03/2021 CLINICAL DATA:  75 year old male choked on steak yesterday. Subsequently unable to swallow. EXAM: NECK SOFT TISSUES - 1+ VIEW COMPARISON:  Chest radiographs today. FINDINGS: Visualized tracheal air column is within normal limits. Normal epiglottis. Normal pharyngeal and prevertebral soft tissue contours. No radiopaque foreign body identified. No evidence of proximal esophageal dilatation. Mild for age cervical spine degeneration. No acute osseous abnormality identified. Negative lung apices. IMPRESSION: Negative. No radiopaque foreign body identified. Electronically Signed   By: Genevie Ann M.D.   On: 02/03/2021 06:32   DG Chest 2 View  Result Date: 02/03/2021 CLINICAL DATA:  75 year old male choked on steak yesterday. Subsequently unable to swallow. EXAM: CHEST - 2 VIEW COMPARISON:  Chest radiographs 06/18/2019 and earlier. FINDINGS: Mediastinal contours remain normal. Visualized tracheal air column is within normal limits. No esophageal dilatation is evident. No radiopaque foreign body identified. Lung volumes are stable and within normal limits. Chronic anterior right 5th and 6th rib fractures. No pneumothorax, pulmonary edema, pleural effusion or confluent pulmonary opacity. No acute osseous abnormality identified. Paucity of bowel gas in the upper abdomen. IMPRESSION: 1. No cardiopulmonary abnormality. No radiopaque foreign body identified. 2. Chronic anterior right rib fractures. Electronically Signed   By: Genevie Ann M.D.   On: 02/03/2021 06:31   DG Abdomen 1 View  Result Date: 02/03/2021 CLINICAL  DATA:  75 year old male choked on steak yesterday. Subsequently unable to swallow. EXAM: ABDOMEN - 1 VIEW COMPARISON:  Portable chest today. Abdominal radiographs 12/04/2019. CT Abdomen and Pelvis 10/18/2019. FINDINGS: Chronic lower lumbar interbody fusion with adjacent segment disease, L3-L4 vacuum disc. Stable, normal bowel  gas pattern. Normal visible abdominal and pelvic visceral contours. Tiny surgical clips or sutures about the pubic symphysis are stable. No acute osseous abnormality identified. No acute radiopaque foreign body identified. IMPRESSION: Stable, negative. Electronically Signed   By: Genevie Ann M.D.   On: 02/03/2021 06:33      Impression / Plan:   Albert Wade is a 75 y.o. y/o male with inability to swallow since last afternoon after he ate some steak.  Concerning for food impaction.  Plan 1.  EGD today  I have discussed alternative options, risks & benefits,  which include, but are not limited to, bleeding, infection, perforation,respiratory complication & drug reaction.  The patient agrees with this plan & written consent will be obtained.    Thank you for involving me in the care of this patient.      LOS: 0 days   Albert Bellows, MD  02/03/2021, 9:10 AM

## 2021-02-03 NOTE — Progress Notes (Signed)
GI note  Was informed by ER around 7 AM that he has a possible food impaction.  Unable to bring the patient up to endoscopy this morning as he has not yet had a Covid test at that time.  We will plan to perform endoscopy around noon this morning  Dr Jonathon Bellows MD,MRCP [facility] Gastroenterology/Hepatology Pager: [phone]

## 2021-02-03 NOTE — Anesthesia Preprocedure Evaluation (Addendum)
Anesthesia Evaluation  Patient identified by [name], date of birth, ID band Patient awake    Reviewed: Allergy & Precautions, H&P , NPO status , Patient's Chart, lab work & pertinent test results  History of Anesthesia Complications Negative for: history of anesthetic complications  Airway Mallampati: II  TM Distance: >3 FB Neck ROM: full    Dental  (+) Upper Dentures, Lower Dentures   Pulmonary neg shortness of breath, sleep apnea , former smoker,    Pulmonary exam normal        Cardiovascular Exercise Tolerance: Good (-) angina+ CAD and + Past MI  (-) DOE Normal cardiovascular exam     Neuro/Psych PSYCHIATRIC DISORDERS  Neuromuscular disease    GI/Hepatic Neg liver ROS, hiatal hernia, GERD  Medicated and Controlled,  Endo/Other  negative endocrine ROS  Renal/GU      Musculoskeletal  (+) Arthritis ,   Abdominal   Peds  Hematology negative hematology ROS (+)   Anesthesia Other Findings Past Medical History: 01/01/2015: Acute bronchitis 05/19/2016: Acute upper respiratory infection No date: Anxiety No date: Arthritis     Comment:  back,wrists,hx. previous fractures No date: BPH (benign prostatic hypertrophy) 04/29/2008: BRONCHITIS, CHRONIC     Comment:  Qualifier: Diagnosis of  By: Halford Chessman MD, Vineet   10/16/2009: CAD     Comment:  Qualifier: Diagnosis of  By: Lynden Ang   No date: CAD (coronary artery disease)     Comment:  angioplasty  No date: Chronic back pain No date: Chronic bronchitis 10/14/2016: Chronic low back pain with sciatica 04/27/2017: Confusion No date: Depression 12/20/2013: Encounter for preventative adult health care exam with  abnormal findings 04/27/2017: Fever No date: GERD (gastroesophageal reflux disease) 05/12/2015: Gynecomastia No date: H/O hiatal hernia     Comment:  pts wife states pt does not have  No date: Heart murmur No date: History of kidney stones No date:  Hypercholesteremia 05/19/2016: Hyperglycemia 04/29/2008: HYPERLIPIDEMIA     Comment:  Qualifier: Diagnosis of  By: Jim Like   12/20/2013: Impaired glucose tolerance 04/27/2017: Insomnia 10/14/2016: Lumbosacral radiculopathy No date: MI (myocardial infarction) ([place])     Comment:  mid-'90's 04/29/2008: MYOCARDIAL INFARCTION     Comment:  Qualifier: History of  By: Jim Like   05/13/2011: OSA (obstructive sleep apnea)     Comment:  Split night study 2011:  AHI 21/hr with obstructive and               central events.  Placed on cpap and titrated to 13cm with              reasonable control, but attempts to increase pressure               increased the number of central events.  Medium resmed               quattro full face mask used.   No date: Peripheral neuropathy     Comment:  peripheral neuropathy 10/21/2013: Phimosis/adherent prepuce No date: Pneumonia No date: Prostate cancer ([place]) 11/05/2010: Shortness of breath     Comment:  Qualifier: Diagnosis of  By: Marilynne Halsted, RN, BSN, Jacquelyn   No date: Sleep apnea     Comment:  mild-no cpap, unable to tolerate 10/29/2020: Squamous cell carcinoma of skin     Comment:  right mid volar forearm No date: Umbilical hernia 01/13/1477: Umbilical hernia without obstruction and without gangrene 04/27/2017: URI (upper respiratory infection) 12/01/2016: UTI (urinary tract infection) No date: Varicose veins 10/14/2016: Varicose veins of  bilateral lower extremities with other  complications 05/26/4430: Wheezing  Past Surgical History: No date: ANGIOPLASTY No date: APPENDECTOMY No date: BACK SURGERY     Comment:  x3 No date: CARDIAC CATHETERIZATION     Comment:  11'11 10/21/2013: CIRCUMCISION; N/A     Comment:  Procedure: CIRCUMCISION ADULT;  Surgeon: Bernestine Amass,              MD;  Location: [facility];  Service: Urology;  Laterality:               N/A; 10/21/2013: CYSTOSCOPY; N/A     Comment:  Procedure: CYSTOSCOPY FLEXIBLE;   Surgeon: Bernestine Amass, MD;  Location: [facility];  Service: Urology;                Laterality: N/A; 06/01/2020: CYSTOSCOPY W/ URETERAL STENT PLACEMENT; Right     Comment:  Procedure: [place]              STENT Exchange;  Surgeon: Hollice Espy, MD;  Location:              [facility];  Service: Urology;  Laterality: Right; 01/06/2020: CYSTOSCOPY WITH BIOPSY; Right     Comment:  Procedure: CYSTOSCOPY WITH BIOPSY;  Surgeon: Hollice Espy, MD;  Location: [facility];  Service: Urology;                Laterality: Right; 01/06/2020: CYSTOSCOPY/URETEROSCOPY/HOLMIUM LASER/STENT PLACEMENT;  Right     Comment:  Procedure: [id number] LASER/STENT               PLACEMENT;  Surgeon: Hollice Espy, MD;  Location: [facility];  Service: Urology;  Laterality: Right; No date: HERNIA REPAIR 06/17/2016: INSERTION OF MESH; N/A     Comment:  Procedure: INSERTION OF MESH;  Surgeon: Excell Seltzer, MD;  [facility];  Service: General;                Laterality: N/A; 2/50/5397: UMBILICAL HERNIA REPAIR; N/A     Comment:  Procedure: REPAIR UMBILICAL HERNIA WITH MESH;  Surgeon:               Excell Seltzer, MD;  [facility]: [facility];  Service:               General;  Laterality: N/A; No date: WRIST SURGERY; Right  BMI    Body Mass Index: 26.54 kg/m      Reproductive/Obstetrics negative OB ROS                             Anesthesia Physical Anesthesia Plan  ASA: IV and emergent  Anesthesia Plan: General ETT, Rapid Sequence and Cricoid Pressure   Post-op Pain Management:    Induction: Intravenous  PONV Risk Score and Plan: Ondansetron, Dexamethasone, Midazolam and Treatment may vary due to age or medical condition  Airway Management Planned: Oral ETT  Additional Equipment:   Intra-op Plan:   Post-operative Plan: Extubation in OR  Informed Consent: I have  reviewed the patients History and Physical, chart, labs and discussed the procedure including the risks, benefits and alternatives for the proposed anesthesia  with the patient or authorized representative who has indicated his/her understanding and acceptance.     Dental Advisory Given  Plan Discussed with: Anesthesiologist, CRNA and Surgeon  Anesthesia Plan Comments: (Patient consented for risks of anesthesia including but not limited to:  - adverse reactions to medications - damage to eyes, teeth, lips or other oral mucosa - nerve damage due to positioning  - sore throat or hoarseness - Damage to heart, brain, nerves, lungs, other parts of body or loss of life  Patient voiced understanding.)       Anesthesia Quick Evaluation

## 2021-02-03 NOTE — ED Notes (Signed)
Patient transported to X-ray 

## 2021-02-03 NOTE — ED Notes (Signed)
Spoke with endoscopy on the phone, states he is scheduled for around noon today. Informed the pt and his wife who is at the bedside.

## 2021-02-03 NOTE — H&P (Signed)
Jonathon Bellows, MD [address] [place], [place], [place], [address] [address], [place], [place], [place], [address] Phone: [phone]  Fax: [phone]  Primary Care Physician:  Biagio Borg, MD   Pre-Procedure History & Physical: HPI:  Albert Wade is a 75 y.o. male is here for an endoscopy    Past Medical History:  Diagnosis Date  . Acute bronchitis 01/01/2015  . Acute upper respiratory infection 05/19/2016  . Anxiety   . Arthritis    back,wrists,hx. previous fractures  . BPH (benign prostatic hypertrophy)   . BRONCHITIS, CHRONIC 04/29/2008   Qualifier: Diagnosis of  By: Halford Chessman MD, [place]    . CAD 10/16/2009   Qualifier: Diagnosis of  By: Lynden Ang    . CAD (coronary artery disease)    angioplasty   . Chronic back pain   . Chronic bronchitis   . 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 Confusion 04/27/2017  . Depression   . Encounter for preventative adult health care exam with abnormal findings 12/20/2013  . Fever 04/27/2017  . GERD (gastroesophageal reflux disease)   . Gynecomastia 05/12/2015  . H/O hiatal hernia    pts wife states pt does not have   . Heart murmur   . History of kidney stones   . Hypercholesteremia   . Hyperglycemia 05/19/2016  . HYPERLIPIDEMIA 04/29/2008   Qualifier: Diagnosis of  By: Jim Like    . Impaired glucose tolerance 12/20/2013  . Insomnia 04/27/2017  . Lumbosacral radiculopathy 10/14/2016  . MI (myocardial infarction) ([facility])    mid-'90's  . MYOCARDIAL INFARCTION 04/29/2008   Qualifier: History of  By: Jim Like    . OSA (obstructive sleep apnea) 05/13/2011   Split night study 2011:  AHI 21/hr with obstructive and central events.  Placed on cpap and titrated to 13cm with reasonable control, but attempts to increase pressure increased the number of central events.  Medium resmed quattro full face mask used.    . Peripheral neuropathy    peripheral neuropathy  . Phimosis/adherent prepuce  10/21/2013  . Pneumonia   . Prostate cancer ([facility])   . Shortness of breath 11/05/2010   Qualifier: Diagnosis of  By: Marilynne Halsted, RN, BSN, Jacquelyn    . Sleep apnea    mild-no cpap, unable to tolerate  . Squamous cell carcinoma of skin 10/29/2020   right mid volar forearm  . Umbilical hernia   . Umbilical hernia without obstruction and without gangrene 05/19/2016  . URI (upper respiratory infection) 04/27/2017  . UTI (urinary tract infection) 12/01/2016  . Varicose veins   . Varicose veins of bilateral lower extremities with other complications 63/78/5885  . Wheezing 01/01/2015    Past Surgical History:  Procedure Laterality Date  . ANGIOPLASTY    . APPENDECTOMY    . BACK SURGERY     x3  . CARDIAC CATHETERIZATION     11'11  . CIRCUMCISION N/A 10/21/2013   Procedure: CIRCUMCISION ADULT;  Surgeon: Bernestine Amass, MD;  Location: [facility];  Service: Urology;  Laterality: N/A;  . CYSTOSCOPY N/A 10/21/2013   Procedure: CYSTOSCOPY FLEXIBLE;  Surgeon: Bernestine Amass, MD;  Location: [facility];  Service: Urology;  Laterality: N/A;  . CYSTOSCOPY W/ URETERAL STENT PLACEMENT Right 06/01/2020   Procedure: CYSTOSCOPY WITH RETROGRADE PYELOGRAM/URETERAL STENT Exchange;  Surgeon: Hollice Espy, MD;  Location: [facility];  Service: Urology;  Laterality: Right;  . CYSTOSCOPY WITH BIOPSY Right 01/06/2020   Procedure: CYSTOSCOPY WITH BIOPSY;  Surgeon: Hollice Espy, MD;  Location: [facility];  Service: Urology;  Laterality: Right;  . CYSTOSCOPY/URETEROSCOPY/HOLMIUM LASER/STENT PLACEMENT Right 01/06/2020   Procedure: CYSTOSCOPY/URETEROSCOPY/HOLMIUM LASER/STENT PLACEMENT;  Surgeon: Hollice Espy, MD;  Location: [facility];  Service: Urology;  Laterality: Right;  . HERNIA REPAIR    . INSERTION OF MESH N/A 06/17/2016   Procedure: INSERTION OF MESH;  Surgeon: Excell Seltzer, MD;  Location: [facility];  Service: General;  Laterality: N/A;  . UMBILICAL HERNIA REPAIR N/A 06/17/2016   Procedure: REPAIR UMBILICAL HERNIA WITH MESH;   Surgeon: Excell Seltzer, MD;  Location: [facility];  Service: General;  Laterality: N/A;  . WRIST SURGERY Right     Prior to Admission medications   Medication Sig Start Date End Date Taking? Authorizing Provider  albuterol (VENTOLIN HFA) 108 (90 Base) MCG/ACT inhaler INHALE 2 PUFFS INTO THE LUNGS 4 TIMES DAILY AS NEEDED 10/19/20  Yes Biagio Borg, MD  ALPRAZolam Duanne Moron) 0.5 MG tablet Take 0.5 mg by mouth 2 (two) times daily as needed for anxiety. 01/28/21  Yes [provider]  ARIPiprazole (ABILIFY) 5 MG tablet Take 5 mg by mouth daily. 01/28/21  Yes [provider]  aspirin 81 MG tablet Take 81 mg by mouth daily.   Yes [provider]  busPIRone (BUSPAR) 15 MG tablet Take 15 mg by mouth 2 (two) times daily. 10/07/20  Yes [provider]  DULoxetine (CYMBALTA) 60 MG capsule Take 60 mg by mouth 2 (two) times daily.  04/23/20  Yes [provider]  furosemide (LASIX) 40 MG tablet Take 40 mg by mouth daily as needed for edema.    Yes [provider]  meloxicam (MOBIC) 15 MG tablet Take 15 mg by mouth daily. 10/07/20  Yes [provider]  oxyCODONE-acetaminophen (PERCOCET) 10-325 MG tablet Take 1 tablet by mouth every 4 (four) hours as needed for pain. Patient taking differently: Take 1-2 tablets by mouth every 4 (four) hours as needed for pain. Max 8 per day 01/06/20  Yes Hollice Espy, MD  potassium chloride (K-DUR) 10 MEQ tablet TAKE 2 TABLETS BY MOUTH PER DAY ON DAYS TAKING FUROSEMIDE Patient taking differently: Take 20 mEq by mouth daily. TAKE 2 TABLETS BY MOUTH PER DAY ON DAYS TAKING FUROSEMIDE 09/10/18  Yes Biagio Borg, MD  simvastatin (ZOCOR) 80 MG tablet Take 1 tablet (80 mg total) by mouth every evening. 09/07/20  Yes Biagio Borg, MD  traZODone (DESYREL) 50 MG tablet TAKE ONE-HALF TO ONE TABLET BY MOUTH AT BEDTIME AS NEEDED FOR SLEEP Patient taking differently: Take 25-50 mg by mouth at bedtime as needed for sleep. TAKE ONE-HALF  TO ONE TABLET BY MOUTH AT BEDTIME AS NEEDED FOR SLEEP 11/05/20  Yes Biagio Borg, MD    Allergies as of 02/03/2021 - Review Complete 02/03/2021  Allergen Reaction Noted  . Cyclobenzaprine  05/11/2020  . Seroquel [quetiapine] Hives 06/01/2015    Family History  Problem Relation Age of Onset  . Prostate cancer Father   . Emphysema Father   . Hyperlipidemia Father   . Hyperlipidemia Mother     Social History   Socioeconomic History  . Marital status: Married    Spouse name: Not on file  . Number of children: Not on file  . Years of education: Not on file  . Highest education level: Not on file  Occupational History  . Occupation: retired Nurse, mental health  . Smoking status: Former Smoker    Packs/day: 0.50    Years:  40.00    Pack years: 20.00    Types: Cigarettes    Quit date: 12/27/1995    Years since quitting: 25.1  . Smokeless tobacco: Never Used  Vaping Use  . Vaping Use: Never used  Substance and Sexual Activity  . Alcohol use: No  . Drug use: No  . Sexual activity: Yes  Other Topics Concern  . Not on file  Social History Narrative  . Not on file   Social Determinants of Health   Financial Resource Strain: Not on file  Food Insecurity: Not on file  Transportation Needs: Not on file  Physical Activity: Not on file  Stress: Not on file  Social Connections: Not on file  Intimate Partner Violence: Not on file    Review of Systems: See HPI, otherwise negative ROS  Physical Exam: BP (!) 170/89   Pulse 74   Temp 98.6 F (37 C) (Temporal)   Resp 20   Ht 5\' 10"  (1.778 m)   Wt 83.9 kg   SpO2 100%   BMI 26.54 kg/m  General:   Alert,  pleasant and cooperative in NAD Head:  Normocephalic and atraumatic. Neck:  Supple; no masses or thyromegaly. Lungs:  Clear throughout to auscultation, normal respiratory effort.    Heart:  +S1, +S2, Regular rate and rhythm, No edema. Abdomen:  Soft, nontender and nondistended. Normal bowel sounds, without  guarding, and without rebound.   Neurologic:  Alert and  oriented x4;  grossly normal neurologically.  Impression/Plan: Albert Wade is here for an endoscopy  to be performed for food impaction.     Risks, benefits, limitations, and alternatives regarding endoscopy have been reviewed with the patient.  Questions have been answered.  All parties agreeable.   Jonathon Bellows, MD  02/03/2021, 12:13 PM

## 2021-02-03 NOTE — ED Notes (Signed)
Provider at bedside- Pt GCS 15; speech clear.  Reports persistent vomiting since being choked on steak at steakhouse 1 day pta.  No stridor noted.  RR even and unlabored on RA; CTA.  Abdomen soft, round.  Skin warm dry and intact.

## 2021-02-03 NOTE — ED Triage Notes (Signed)
Patient ambulatory to triage with steady gait, without difficulty or distress noted; pt reports approx 5pm yesterday got choked while eating steak; since has been unable to swallow; denies hx of same

## 2021-02-03 NOTE — Anesthesia Procedure Notes (Signed)
Procedure Name: Intubation Performed by: Demetrius Charity, CRNA Pre-anesthesia Checklist: Patient identified, Patient being monitored, Timeout performed, Emergency Drugs available and Suction available Patient Re-evaluated:Patient Re-evaluated prior to induction Oxygen Delivery Method: Circle system utilized Preoxygenation: Pre-oxygenation with 100% oxygen Induction Type: IV induction, Cricoid Pressure applied and Rapid sequence Laryngoscope Size: 3 and McGraph Grade View: Grade I Tube type: Oral Tube size: 7.0 mm Number of attempts: 1 Airway Equipment and Method: Stylet Placement Confirmation: ETT inserted through vocal cords under direct vision,  positive ETCO2 and breath sounds checked- equal and bilateral Secured at: 22 cm Tube secured with: Tape Dental Injury: Teeth and Oropharynx as per pre-operative assessment

## 2021-02-03 NOTE — ED Provider Notes (Signed)
[facility] Emergency Department Provider Note  ____________________________________________   None    (approximate)  I have reviewed the triage vital signs and the nursing notes.   HISTORY  Chief Complaint Foreign Body   HPI Albert Wade is a 75 y.o. male with a past medical history of CAD, HDL, GERD, bronchitis, anxiety, arthritis, BPH, depression, reflux, and OSA who presents for assessment of sensation of something being stuck in his throat as well as some nonbloody vomiting since yesterday afternoon.  He states he thinks he got some steak stuck in his throat.  Patient states that while eating steak he felt a sensation that something got stuck and since then has not been able to swallow anything properly and has vomited several times primarily phlegm.  States he vomited up medications to try to take a little bit of chest pressure while vomiting.  He states he also had a little shortness of breath while vomiting but none at present.  Denies any headache, earache, sore throat, cough, abdominal pain, diarrhea, dysuria, rash extremity pain or other acute sick symptoms.  No prior similar episodes.  No clearly fitting aggravating factors.  Patient states he has not been drooling and has been able to swallow his secretions.         Past Medical History:  Diagnosis Date  . Acute bronchitis 01/01/2015  . Acute upper respiratory infection 05/19/2016  . Anxiety   . Arthritis    back,wrists,hx. previous fractures  . BPH (benign prostatic hypertrophy)   . BRONCHITIS, CHRONIC 04/29/2008   Qualifier: Diagnosis of  By: Halford Chessman MD, [place]    . CAD 10/16/2009   Qualifier: Diagnosis of  By: Lynden Ang    . CAD (coronary artery disease)    angioplasty   . Chronic back pain   . Chronic bronchitis   . 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 Confusion 04/27/2017  . Depression   . Encounter for preventative adult health care exam with abnormal findings  12/20/2013  . Fever 04/27/2017  . GERD (gastroesophageal reflux disease)   . Gynecomastia 05/12/2015  . H/O hiatal hernia    pts wife states pt does not have   . Heart murmur   . History of kidney stones   . Hypercholesteremia   . Hyperglycemia 05/19/2016  . HYPERLIPIDEMIA 04/29/2008   Qualifier: Diagnosis of  By: Jim Like    . Impaired glucose tolerance 12/20/2013  . Insomnia 04/27/2017  . Lumbosacral radiculopathy 10/14/2016  . MI (myocardial infarction) ([place])    mid-'90's  . MYOCARDIAL INFARCTION 04/29/2008   Qualifier: History of  By: Jim Like    . OSA (obstructive sleep apnea) 05/13/2011   Split night study 2011:  AHI 21/hr with obstructive and central events.  Placed on cpap and titrated to 13cm with reasonable control, but attempts to increase pressure increased the number of central events.  Medium resmed quattro full face mask used.    . Peripheral neuropathy    peripheral neuropathy  . Phimosis/adherent prepuce 10/21/2013  . Pneumonia   . Prostate cancer ([place])   . Shortness of breath 11/05/2010   Qualifier: Diagnosis of  By: Marilynne Halsted, RN, BSN, Jacquelyn    . Sleep apnea    mild-no cpap, unable to tolerate  . Squamous cell carcinoma of skin 10/29/2020   right mid volar forearm  . Umbilical hernia   . Umbilical hernia without obstruction and without gangrene 05/19/2016  . URI (upper respiratory infection) 04/27/2017  .  UTI (urinary tract infection) 12/01/2016  . Varicose veins   . Varicose veins of bilateral lower extremities with other complications 55/73/2202  . Wheezing 01/01/2015    Patient Active Problem List   Diagnosis Date Noted  . RUQ pain 11/09/2020  . Ureteral stricture, right 06/01/2020  . Weight loss 10/08/2019  . Abdominal pain 10/08/2019  . Constipation 10/08/2019  . Fever 04/27/2017  . URI (upper respiratory infection) 04/27/2017  . Slowing, urinary stream 04/27/2017  . Confusion 04/27/2017  . Insomnia 04/27/2017  . Prostate cancer  ([place]) 01/27/2017  . UTI (urinary tract infection) 12/01/2016  . 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 Lumbosacral radiculopathy 10/14/2016  . Varicose veins of bilateral lower extremities with other complications 54/27/0623  . Umbilical hernia without obstruction and without gangrene 05/19/2016  . Hyperglycemia 05/19/2016  . Acute upper respiratory infection 05/19/2016  . Gynecomastia 05/12/2015  . Encounter for preventative adult health care exam with abnormal findings 12/20/2013  . GERD (gastroesophageal reflux disease)   . Anxiety   . Chronic back pain   . Depression   . BPH (benign prostatic hypertrophy)   . Peripheral neuropathy   . Phimosis/adherent prepuce 10/21/2013  . OSA (obstructive sleep apnea) 05/13/2011  . Coronary atherosclerosis 10/16/2009  . HYPERLIPIDEMIA 04/29/2008  . MYOCARDIAL INFARCTION 04/29/2008  . BRONCHITIS, CHRONIC 04/29/2008    Past Surgical History:  Procedure Laterality Date  . ANGIOPLASTY    . APPENDECTOMY    . BACK SURGERY     x3  . CARDIAC CATHETERIZATION     11'11  . CIRCUMCISION N/A 10/21/2013   Procedure: CIRCUMCISION ADULT;  Surgeon: Bernestine Amass, MD;  Location: [facility];  Service: Urology;  Laterality: N/A;  . CYSTOSCOPY N/A 10/21/2013   Procedure: CYSTOSCOPY FLEXIBLE;  Surgeon: Bernestine Amass, MD;  Location: [facility];  Service: Urology;  Laterality: N/A;  . CYSTOSCOPY W/ URETERAL STENT PLACEMENT Right 06/01/2020   Procedure: CYSTOSCOPY WITH RETROGRADE PYELOGRAM/URETERAL STENT Exchange;  Surgeon: Hollice Espy, MD;  Location: [facility] ORS;  Service: Urology;  Laterality: Right;  . CYSTOSCOPY WITH BIOPSY Right 01/06/2020   Procedure: CYSTOSCOPY WITH BIOPSY;  Surgeon: Hollice Espy, MD;  Location: [facility] ORS;  Service: Urology;  Laterality: Right;  . CYSTOSCOPY/URETEROSCOPY/HOLMIUM LASER/STENT PLACEMENT Right 01/06/2020   Procedure: CYSTOSCOPY/URETEROSCOPY/HOLMIUM LASER/STENT PLACEMENT;  Surgeon: Hollice Espy, MD;  Location: [facility]  ORS;  Service: Urology;  Laterality: Right;  . HERNIA REPAIR    . INSERTION OF MESH N/A 06/17/2016   Procedure: INSERTION OF MESH;  Surgeon: Excell Seltzer, MD;  Location: [facility];  Service: General;  Laterality: N/A;  . UMBILICAL HERNIA REPAIR N/A 06/17/2016   Procedure: REPAIR UMBILICAL HERNIA WITH MESH;  Surgeon: Excell Seltzer, MD;  Location: [facility];  Service: General;  Laterality: N/A;  . WRIST SURGERY Right     Prior to Admission medications   Medication Sig Start Date End Date Taking? Authorizing Provider  albuterol (VENTOLIN HFA) 108 (90 Base) MCG/ACT inhaler INHALE 2 PUFFS INTO THE LUNGS 4 TIMES DAILY AS NEEDED 10/19/20   Biagio Borg, MD  aspirin 81 MG tablet Take 81 mg by mouth daily.    [provider]  busPIRone (BUSPAR) 15 MG tablet Take 15 mg by mouth 2 (two) times daily. 10/07/20   [provider]  DULoxetine (CYMBALTA) 60 MG capsule Take 60 mg by mouth 2 (two) times daily.  04/23/20   [provider]  furosemide (LASIX) 40 MG tablet Take 40 mg by mouth daily as needed for  edema.     [provider]  meloxicam (MOBIC) 15 MG tablet Take 15 mg by mouth daily. 10/07/20   [provider]  oxyCODONE-acetaminophen (PERCOCET) 10-325 MG tablet Take 1 tablet by mouth every 4 (four) hours as needed for pain. Patient taking differently: Take 1-2 tablets by mouth every 4 (four) hours as needed for pain. Max 8 per day 01/06/20   Hollice Espy, MD  potassium chloride (K-DUR) 10 MEQ tablet TAKE 2 TABLETS BY MOUTH PER DAY ON DAYS TAKING FUROSEMIDE 09/10/18   Biagio Borg, MD  simvastatin (ZOCOR) 80 MG tablet Take 1 tablet (80 mg total) by mouth every evening. 09/07/20   Biagio Borg, MD  traZODone (DESYREL) 50 MG tablet TAKE ONE-HALF TO ONE TABLET BY MOUTH AT BEDTIME AS NEEDED FOR SLEEP 11/05/20   Biagio Borg, MD    Allergies Cyclobenzaprine and Seroquel [quetiapine]  Family History  Problem Relation Age of Onset  . Prostate cancer  Father   . Emphysema Father   . Hyperlipidemia Father   . Hyperlipidemia Mother     Social History Social History   Tobacco Use  . Smoking status: Former Smoker    Packs/day: 0.50    Years: 40.00    Pack years: 20.00    Types: Cigarettes    Quit date: 12/27/1995    Years since quitting: 25.1  . Smokeless tobacco: Never Used  Vaping Use  . Vaping Use: Never used  Substance Use Topics  . Alcohol use: No  . Drug use: No    Review of Systems  Review of Systems  Constitutional: Negative for chills and fever.  HENT: Negative for sore throat.   Eyes: Negative for pain.  Respiratory: Positive for shortness of breath. Negative for cough and stridor.   Cardiovascular: Positive for chest pain ( some pressure with vomiting).  Gastrointestinal: Positive for abdominal pain, nausea and vomiting.  Genitourinary: Negative for dysuria.  Musculoskeletal: Negative for myalgias.  Skin: Negative for rash.  Neurological: Negative for seizures, loss of consciousness and headaches.  Psychiatric/Behavioral: Negative for suicidal ideas.  All other systems reviewed and are negative.     ____________________________________________   PHYSICAL EXAM:  VITAL SIGNS: ED Triage Vitals  Enc Vitals Group     BP      Pulse      Resp      Temp      Temp src      SpO2      Weight      Height      Head Circumference      Peak Flow      Pain Score      Pain Loc      Pain Edu?      Excl. in [place]?    Vitals:   02/03/21 0534  BP: (!) 145/89  Pulse: 62  Resp: 18  Temp: 97.9 F (36.6 C)  SpO2: 99%   Physical Exam Vitals and nursing note reviewed.  Constitutional:      Appearance: He is well-developed and well-nourished.  HENT:     Head: Normocephalic and atraumatic.     Right Ear: External ear normal.     Left Ear: External ear normal.     Nose: Nose normal.     Mouth/Throat:     Mouth: Mucous membranes are moist.  Eyes:     Conjunctiva/sclera: Conjunctivae normal.   Cardiovascular:     Rate and Rhythm: Normal rate and regular rhythm.  Heart sounds: No murmur heard.   Pulmonary:     Effort: Pulmonary effort is normal. No respiratory distress.     Breath sounds: Normal breath sounds.  Abdominal:     Palpations: Abdomen is soft.     Tenderness: There is no abdominal tenderness.  Musculoskeletal:        General: No edema.     Cervical back: Neck supple.  Skin:    General: Skin is warm and dry.     Capillary Refill: Capillary refill takes less than 2 seconds.  Neurological:     Mental Status: He is alert and oriented to person, place, and time.  Psychiatric:        Mood and Affect: Mood and affect and mood normal.      ____________________________________________   LABS (all labs ordered are listed, but only abnormal results are displayed)  Labs Reviewed  COMPREHENSIVE METABOLIC PANEL - Abnormal; Notable for the following components:      Result Value   Glucose, Bld 159 (*)    Total Protein 8.2 (*)    All other components within normal limits  RESP PANEL BY RT-PCR (FLU A&B, COVID) ARPGX2  CBC WITH DIFFERENTIAL/PLATELET  TROPONIN I (HIGH SENSITIVITY)   ____________________________________________  EKG  Sinus rhythm with a ventricular rate of 69, normal axis, unremarkable's, no evidence of acute ischemia or other significant underlying arrhythmia. ____________________________________________  RADIOLOGY  ED MD interpretation: Neck x-ray soft tissue shows no abnormalities. Chest x-ray shows no evidence of radiopaque foreign body, clear esophageal dilation, or other acute thoracic process. KUB shows no clear dilated loops or other evidence of SBO.  Official radiology report(s): DG Neck Soft Tissue  Result Date: 02/03/2021 CLINICAL DATA:  75 year old male choked on steak yesterday. Subsequently unable to swallow. EXAM: NECK SOFT TISSUES - 1+ VIEW COMPARISON:  Chest radiographs today. FINDINGS: Visualized tracheal air column is  within normal limits. Normal epiglottis. Normal pharyngeal and prevertebral soft tissue contours. No radiopaque foreign body identified. No evidence of proximal esophageal dilatation. Mild for age cervical spine degeneration. No acute osseous abnormality identified. Negative lung apices. IMPRESSION: Negative. No radiopaque foreign body identified. Electronically Signed   By: Genevie Ann M.D.   On: 02/03/2021 06:32   DG Chest 2 View  Result Date: 02/03/2021 CLINICAL DATA:  75 year old male choked on steak yesterday. Subsequently unable to swallow. EXAM: CHEST - 2 VIEW COMPARISON:  Chest radiographs 06/18/2019 and earlier. FINDINGS: Mediastinal contours remain normal. Visualized tracheal air column is within normal limits. No esophageal dilatation is evident. No radiopaque foreign body identified. Lung volumes are stable and within normal limits. Chronic anterior right 5th and 6th rib fractures. No pneumothorax, pulmonary edema, pleural effusion or confluent pulmonary opacity. No acute osseous abnormality identified. Paucity of bowel gas in the upper abdomen. IMPRESSION: 1. No cardiopulmonary abnormality. No radiopaque foreign body identified. 2. Chronic anterior right rib fractures. Electronically Signed   By: Genevie Ann M.D.   On: 02/03/2021 06:31   DG Abdomen 1 View  Result Date: 02/03/2021 CLINICAL DATA:  75 year old male choked on steak yesterday. Subsequently unable to swallow. EXAM: ABDOMEN - 1 VIEW COMPARISON:  Portable chest today. Abdominal radiographs 12/04/2019. CT Abdomen and Pelvis 10/18/2019. FINDINGS: Chronic lower lumbar interbody fusion with adjacent segment disease, L3-L4 vacuum disc. Stable, normal bowel gas pattern. Normal visible abdominal and pelvic visceral contours. Tiny surgical clips or sutures about the pubic symphysis are stable. No acute osseous abnormality identified. No acute radiopaque foreign body identified. IMPRESSION: Stable, negative. Electronically  Signed   By: Genevie Ann M.D.   On:  02/03/2021 06:33    ____________________________________________   PROCEDURES  Procedure(s) performed (including Critical Care):  .1-3 Lead EKG Interpretation Performed by: Lucrezia Starch, MD Authorized by: Lucrezia Starch, MD     Interpretation: normal     ECG rate assessment: normal     Rhythm: sinus rhythm     Ectopy: none     Conduction: normal       ____________________________________________   INITIAL IMPRESSION / ASSESSMENT AND PLAN / ED COURSE      Patient presents with above to history exam for assessment of sensation of something being stuck in his throat as well as some vomiting and dry heaving of phlegm and being unable to tolerate medications although able to tolerate secretions after eating steak yesterday.  Yes endorses some chest tightness while vomiting.  He is afebrile and hemodynamically stable on arrival.  He is tolerating his secretions.  Oropharynx unremarkable.  No stridor.  Lungs clear bilaterally. KUB and chest x-ray did not show evidence of SBO or other acute intrathoracic or abdominal abnormality. Neck x-rays unremarkable.  Patient does endorse some substernal chest pressure and shortness of breath while vomiting no evidence of acute infectious process embolus patient for ACS given reassuring EKG and nonelevated troponin. CBC is unremarkable and CMP shows no significant electrolyte or metabolic derangements. Patient was given Reglan and some ginger ale but was able to tolerate this and started dry heaving and vomiting but intracranial since he attempted to drink any. Discussed concern for likely upper GI food bolus obstruction with on-call gastroenterologist Dr. Vicente Males recommended trial of glucagon and if unsuccessful plan for EGD later today.  Glucagon ordered. Clinical plan discussed with patient.   ____________________________________________   FINAL CLINICAL IMPRESSION(S) / ED DIAGNOSES  Final diagnoses:  GI obstruction (HCC)     Medications  glucagon (GLUCAGEN) injection 1 mg (has no administration in time range)  lactated ringers bolus 500 mL (500 mLs Intravenous New Bag/Given 02/03/21 0629)  metoCLOPramide (REGLAN) injection 10 mg (10 mg Intravenous Given 02/03/21 9323)     ED Discharge Orders    None       Note:  This document was prepared using Dragon voice recognition software and may include unintentional dictation errors.   Lucrezia Starch, MD 02/03/21 [phone]

## 2021-02-03 NOTE — Transfer of Care (Signed)
Immediate Anesthesia Transfer of Care Note  Patient: Albert Wade  Procedure(s) Performed: ESOPHAGOGASTRODUODENOSCOPY (EGD) (N/A )  Patient Location: PACU  Anesthesia Type:General  Level of Consciousness: awake, alert  and oriented  Airway & Oxygen Therapy: Patient Spontanous Breathing  Post-op Assessment: Report given to RN and Post -op Vital signs reviewed and stable  Post vital signs: Reviewed and stable  Last Vitals:  Vitals Value Taken Time  BP 141/71 02/03/21 1336  Temp 36.2 C 02/03/21 1336  Pulse 83 02/03/21 1337  Resp 17 02/03/21 1337  SpO2 100 % 02/03/21 1337  Vitals shown include unvalidated device data.  Last Pain:  Vitals:   02/03/21 1200  TempSrc: Temporal  PainSc:          Complications: No complications documented.

## 2021-02-04 NOTE — Anesthesia Postprocedure Evaluation (Signed)
Anesthesia Post Note  Patient: Albert Wade  Procedure(s) Performed: ESOPHAGOGASTRODUODENOSCOPY (EGD) (N/A )  Patient location during evaluation: PACU Anesthesia Type: General Level of consciousness: awake and alert Pain management: pain level controlled Vital Signs Assessment: post-procedure vital signs reviewed and stable Respiratory status: spontaneous breathing, nonlabored ventilation, respiratory function stable and patient connected to nasal cannula oxygen Cardiovascular status: blood pressure returned to baseline and stable Postop Assessment: no apparent nausea or vomiting Anesthetic complications: no   No complications documented.   Last Vitals:  Vitals:   02/03/21 1345 02/03/21 1353  BP: (!) 170/81 (!) 158/77  Pulse: 78   Resp: 17   Temp:  (!) 36.2 C  SpO2: 100%     Last Pain:  Vitals:   02/03/21 1353  TempSrc:   PainSc: 0-No pain                 Precious Haws Waylen Depaolo

## 2021-02-08 ENCOUNTER — Ambulatory Visit: Payer: Medicare Other | Admitting: Internal Medicine

## 2021-02-15 ENCOUNTER — Other Ambulatory Visit: Payer: Self-pay

## 2021-02-15 DIAGNOSIS — K56609 Unspecified intestinal obstruction, unspecified as to partial versus complete obstruction: Secondary | ICD-10-CM | POA: Insufficient documentation

## 2021-03-03 ENCOUNTER — Ambulatory Visit: Payer: Medicare Other

## 2021-03-05 ENCOUNTER — Other Ambulatory Visit
Admission: RE | Admit: 2021-03-05 | Discharge: 2021-03-05 | Disposition: A | Discharge status: Home / Self Care | Payer: Medicare Other | Source: Ambulatory Visit | Attending: Gastroenterology | Admitting: Gastroenterology

## 2021-03-05 ENCOUNTER — Other Ambulatory Visit: Payer: Self-pay

## 2021-03-05 DIAGNOSIS — Z20822 Contact with and (suspected) exposure to covid-19: Secondary | ICD-10-CM | POA: Insufficient documentation

## 2021-03-05 DIAGNOSIS — Z01812 Encounter for preprocedural laboratory examination: Secondary | ICD-10-CM | POA: Diagnosis present

## 2021-03-05 LAB — SARS CORONAVIRUS 2 (TAT 6-24 HRS): SARS Coronavirus 2: NEGATIVE

## 2021-03-08 ENCOUNTER — Encounter: Payer: Self-pay | Admitting: Gastroenterology

## 2021-03-09 ENCOUNTER — Other Ambulatory Visit: Payer: Self-pay

## 2021-03-09 ENCOUNTER — Encounter
Admission: RE | Disposition: A | Discharge status: Home / Self Care | Payer: Self-pay | Source: Home / Self Care | Attending: Gastroenterology

## 2021-03-09 ENCOUNTER — Ambulatory Visit: Payer: Medicare Other | Admitting: Certified Registered Nurse Anesthetist

## 2021-03-09 ENCOUNTER — Encounter: Payer: Self-pay | Admitting: Gastroenterology

## 2021-03-09 ENCOUNTER — Ambulatory Visit
Admission: RE | Admit: 2021-03-09 | Discharge: 2021-03-09 | Disposition: A | Discharge status: Home / Self Care | Payer: Medicare Other | Attending: Gastroenterology | Admitting: Gastroenterology

## 2021-03-09 DIAGNOSIS — Z79899 Other long term (current) drug therapy: Secondary | ICD-10-CM | POA: Diagnosis not present

## 2021-03-09 DIAGNOSIS — Z87891 Personal history of nicotine dependence: Secondary | ICD-10-CM | POA: Diagnosis not present

## 2021-03-09 DIAGNOSIS — K56609 Unspecified intestinal obstruction, unspecified as to partial versus complete obstruction: Secondary | ICD-10-CM

## 2021-03-09 DIAGNOSIS — R1310 Dysphagia, unspecified: Secondary | ICD-10-CM | POA: Insufficient documentation

## 2021-03-09 DIAGNOSIS — K222 Esophageal obstruction: Secondary | ICD-10-CM | POA: Diagnosis not present

## 2021-03-09 DIAGNOSIS — Z7982 Long term (current) use of aspirin: Secondary | ICD-10-CM | POA: Insufficient documentation

## 2021-03-09 DIAGNOSIS — J449 Chronic obstructive pulmonary disease, unspecified: Secondary | ICD-10-CM | POA: Diagnosis not present

## 2021-03-09 DIAGNOSIS — K449 Diaphragmatic hernia without obstruction or gangrene: Secondary | ICD-10-CM | POA: Diagnosis not present

## 2021-03-09 DIAGNOSIS — K219 Gastro-esophageal reflux disease without esophagitis: Secondary | ICD-10-CM | POA: Diagnosis not present

## 2021-03-09 DIAGNOSIS — E785 Hyperlipidemia, unspecified: Secondary | ICD-10-CM | POA: Diagnosis not present

## 2021-03-09 DIAGNOSIS — Z8546 Personal history of malignant neoplasm of prostate: Secondary | ICD-10-CM | POA: Insufficient documentation

## 2021-03-09 DIAGNOSIS — Z85828 Personal history of other malignant neoplasm of skin: Secondary | ICD-10-CM | POA: Diagnosis not present

## 2021-03-09 DIAGNOSIS — K2100 Gastro-esophageal reflux disease with esophagitis, without bleeding: Secondary | ICD-10-CM | POA: Diagnosis not present

## 2021-03-09 HISTORY — DX: Angina pectoris, unspecified: I20.9

## 2021-03-09 HISTORY — DX: Chronic obstructive pulmonary disease, unspecified: J44.9

## 2021-03-09 HISTORY — PX: ESOPHAGOGASTRODUODENOSCOPY (EGD) WITH PROPOFOL: SHX5813

## 2021-03-09 SURGERY — ESOPHAGOGASTRODUODENOSCOPY (EGD) WITH PROPOFOL
Anesthesia: General

## 2021-03-09 MED ORDER — PROPOFOL 10 MG/ML IV BOLUS
INTRAVENOUS | Status: DC | PRN
  Administered 2021-03-09: 70 mg via INTRAVENOUS

## 2021-03-09 MED ORDER — SODIUM CHLORIDE 0.9 % IV SOLN: INTRAVENOUS | Status: DC

## 2021-03-09 MED ORDER — PROPOFOL 500 MG/50ML IV EMUL
INTRAVENOUS | Status: DC | PRN
  Administered 2021-03-09: 140 ug/kg/min via INTRAVENOUS

## 2021-03-09 MED ORDER — LIDOCAINE HCL (CARDIAC) PF 100 MG/5ML IV SOSY
PREFILLED_SYRINGE | INTRAVENOUS | Status: DC | PRN
  Administered 2021-03-09: 50 mg via INTRAVENOUS

## 2021-03-09 NOTE — Anesthesia Preprocedure Evaluation (Signed)
Anesthesia Evaluation  Patient identified by [name], date of birth, ID band Patient awake    Reviewed: Allergy & Precautions, H&P , NPO status , Patient's Chart, lab work & pertinent test results  History of Anesthesia Complications Negative for: history of anesthetic complications  Airway Mallampati: III  TM Distance: <3 FB Neck ROM: limited    Dental  (+) Missing   Pulmonary sleep apnea , COPD, former smoker,    Pulmonary exam normal        Cardiovascular Exercise Tolerance: Good (-) angina+ CAD and + Past MI  Normal cardiovascular exam     Neuro/Psych PSYCHIATRIC DISORDERS  Neuromuscular disease    GI/Hepatic Neg liver ROS, hiatal hernia, GERD  Medicated and Controlled,  Endo/Other  negative endocrine ROS  Renal/GU negative Renal ROS  negative genitourinary   Musculoskeletal  (+) Arthritis ,   Abdominal   Peds  Hematology negative hematology ROS (+)   Anesthesia Other Findings Past Medical History: 01/01/2015: Acute bronchitis 05/19/2016: Acute upper respiratory infection No date: Anginal pain ([place]) No date: Anxiety No date: Arthritis     Comment:  back,wrists,hx. previous fractures No date: BPH (benign prostatic hypertrophy) 04/29/2008: BRONCHITIS, CHRONIC     Comment:  Qualifier: Diagnosis of  By: Halford Chessman MD, Vineet   10/16/2009: CAD     Comment:  Qualifier: Diagnosis of  By: Lynden Ang   No date: CAD (coronary artery disease)     Comment:  angioplasty  No date: Chronic back pain No date: Chronic bronchitis 10/14/2016: Chronic low back pain with sciatica 04/27/2017: Confusion No date: COPD (chronic obstructive pulmonary disease) ([place]) No date: Depression 12/20/2013: Encounter for preventative adult health care exam with  abnormal findings 04/27/2017: Fever No date: GERD (gastroesophageal reflux disease) 05/12/2015: Gynecomastia No date: H/O hiatal hernia     Comment:  pts wife states pt does not  have  No date: Heart murmur No date: History of kidney stones No date: Hypercholesteremia 05/19/2016: Hyperglycemia 04/29/2008: HYPERLIPIDEMIA     Comment:  Qualifier: Diagnosis of  By: Jim Like   12/20/2013: Impaired glucose tolerance 04/27/2017: Insomnia 10/14/2016: Lumbosacral radiculopathy No date: MI (myocardial infarction) ([place])     Comment:  mid-'90's 04/29/2008: MYOCARDIAL INFARCTION     Comment:  Qualifier: History of  By: Jim Like   05/13/2011: OSA (obstructive sleep apnea)     Comment:  Split night study 2011:  AHI 21/hr with obstructive and               central events.  Placed on cpap and titrated to 13cm with              reasonable control, but attempts to increase pressure               increased the number of central events.  Medium resmed               quattro full face mask used.   No date: Peripheral neuropathy     Comment:  peripheral neuropathy 10/21/2013: Phimosis/adherent prepuce No date: Pneumonia No date: Prostate cancer ([place]) 11/05/2010: Shortness of breath     Comment:  Qualifier: Diagnosis of  By: Marilynne Halsted, RN, BSN, Jacquelyn   No date: Sleep apnea     Comment:  mild-no cpap, unable to tolerate 10/29/2020: Squamous cell carcinoma of skin     Comment:  right mid volar forearm No date: Umbilical hernia 6/38/9373: Umbilical hernia without obstruction and without gangrene 04/27/2017: URI (upper respiratory infection) 12/01/2016: UTI (urinary tract infection)  No date: Varicose veins 10/14/2016: Varicose veins of bilateral lower extremities with other  complications 02/01/7411: Wheezing  Past Surgical History: No date: ANGIOPLASTY No date: APPENDECTOMY No date: BACK SURGERY     Comment:  x3 No date: CARDIAC CATHETERIZATION     Comment:  11'11 10/21/2013: CIRCUMCISION; N/A     Comment:  Procedure: CIRCUMCISION ADULT;  Surgeon: Bernestine Amass,              MD;  Location: [facility];  Service: Urology;  Laterality:                N/A; 10/21/2013: CYSTOSCOPY; N/A     Comment:  Procedure: CYSTOSCOPY FLEXIBLE;  Surgeon: Bernestine Amass, MD;  Location: [facility];  Service: Urology;                Laterality: N/A; 06/01/2020: CYSTOSCOPY W/ URETERAL STENT PLACEMENT; Right     Comment:  Procedure: [place]              STENT Exchange;  Surgeon: Hollice Espy, MD;  Location:              [facility];  Service: Urology;  Laterality: Right; 01/06/2020: CYSTOSCOPY WITH BIOPSY; Right     Comment:  Procedure: CYSTOSCOPY WITH BIOPSY;  Surgeon: Hollice Espy, MD;  Location: [facility];  Service: Urology;                Laterality: Right; 01/06/2020: CYSTOSCOPY/URETEROSCOPY/HOLMIUM LASER/STENT PLACEMENT;  Right     Comment:  Procedure: INOMVEHMCN/OBSJGGEZMOQH/UTMLYYT LASER/STENT               PLACEMENT;  Surgeon: Hollice Espy, MD;  Location: [facility]              ORS;  Service: Urology;  Laterality: Right; 02/03/2021: ESOPHAGOGASTRODUODENOSCOPY; N/A     Comment:  Procedure: ESOPHAGOGASTRODUODENOSCOPY (EGD);  Surgeon:               Jonathon Bellows, MD;  Location: [facility] END[facility]COPY;  Service:               Gastroenterology;  Laterality: N/A; No date: HERNIA REPAIR 06/17/2016: INSERTION OF MESH; N/A     Comment:  Procedure: INSERTION OF MESH;  Surgeon: Excell Seltzer, MD;  Location: [facility];  Service: General;                Laterality: N/A; 0/35/4656: UMBILICAL HERNIA REPAIR; N/A     Comment:  Procedure: REPAIR UMBILICAL HERNIA WITH MESH;  Surgeon:               Excell Seltzer, MD;  Location: [facility];  Service:               General;  Laterality: N/A; No date: WRIST SURGERY; Right  BMI    Body Mass Index: 27.26 kg/m      Reproductive/Obstetrics negative OB ROS                             Anesthesia Physical Anesthesia Plan  ASA: III  Anesthesia Plan: General   Post-op Pain Management:    Induction: Intravenous  PONV Risk  Score and Plan: Propofol infusion and TIVA  Airway Management Planned: Natural Airway and Nasal Cannula  Additional Equipment:   Intra-op Plan:   Post-operative Plan:   Informed Consent: I have reviewed the patients History and Physical, chart, labs and discussed the procedure including the risks, benefits and alternatives for the proposed anesthesia with the patient or authorized representative who has indicated his/her understanding and acceptance.     Dental Advisory Given  Plan Discussed with: Anesthesiologist, CRNA and Surgeon  Anesthesia Plan Comments: (Patient consented for risks of anesthesia including but not limited to:  - adverse reactions to medications - risk of airway placement if required - damage to eyes, teeth, lips or other oral mucosa - nerve damage due to positioning  - sore throat or hoarseness - Damage to heart, brain, nerves, lungs, other parts of body or loss of life  Patient voiced understanding.)        Anesthesia Quick Evaluation

## 2021-03-09 NOTE — H&P (Signed)
Jonathon Bellows, MD [address] [place], [place], [place], [address] [address], [place], [place], [place], [address] Phone: [phone]  Fax: [phone]  Primary Care Physician:  Biagio Borg, MD   Pre-Procedure History & Physical: HPI:  Albert Wade is a 75 y.o. male is here for an endoscopy    Past Medical History:  Diagnosis Date  . Acute bronchitis 01/01/2015  . Acute upper respiratory infection 05/19/2016  . Anginal pain ([place])   . Anxiety   . Arthritis    back,wrists,hx. previous fractures  . BPH (benign prostatic hypertrophy)   . BRONCHITIS, CHRONIC 04/29/2008   Qualifier: Diagnosis of  By: Halford Chessman MD, Vine[place]    . CAD 10/16/2009   Qualifier: Diagnosis of  By: Lynden Ang    . CAD (coronary artery disease)    angioplasty   . Chronic back pain   . Chronic bronchitis   . 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 Confusion 04/27/2017  . COPD (chronic obstructive pulmonary disease) ([place])   . Depression   . Encounter for preventative adult health care exam with abnormal findings 12/20/2013  . Fever 04/27/2017  . GERD (gastroesophageal reflux disease)   . Gynecomastia 05/12/2015  . H/O hiatal hernia    pts wife states pt does not have   . Heart murmur   . History of kidney stones   . Hypercholesteremia   . Hyperglycemia 05/19/2016  . HYPERLIPIDEMIA 04/29/2008   Qualifier: Diagnosis of  By: Jim Like    . Impaired glucose tolerance 12/20/2013  . Insomnia 04/27/2017  . Lumbosacral radiculopathy 10/14/2016  . MI (myocardial infarction) ([place])    mid-'90's  . MYOCARDIAL INFARCTION 04/29/2008   Qualifier: History of  By: Jim Like    . OSA (obstructive sleep apnea) 05/13/2011   Split night study 2011:  AHI 21/hr with obstructive and central events.  Placed on cpap and titrated to 13cm with reasonable control, but attempts to increase pressure increased the number of central events.  Medium resmed quattro full face mask used.    .  Peripheral neuropathy    peripheral neuropathy  . Phimosis/adherent prepuce 10/21/2013  . Pneumonia   . Prostate cancer ([place])   . Shortness of breath 11/05/2010   Qualifier: Diagnosis of  By: Marilynne Halsted, RN, BSN, Jacquelyn    . Sleep apnea    mild-no cpap, unable to tolerate  . Squamous cell carcinoma of skin 10/29/2020   right mid volar forearm  . Umbilical hernia   . Umbilical hernia without obstruction and without gangrene 05/19/2016  . URI (upper respiratory infection) 04/27/2017  . UTI (urinary tract infection) 12/01/2016  . Varicose veins   . Varicose veins of bilateral lower extremities with other complications 42/87/6811  . Wheezing 01/01/2015    Past Surgical History:  Procedure Laterality Date  . ANGIOPLASTY    . APPENDECTOMY    . BACK SURGERY     x3  . CARDIAC CATHETERIZATION     11'11  . CIRCUMCISION N/A 10/21/2013   Procedure: CIRCUMCISION ADULT;  Surgeon: Bernestine Amass, MD;  [facility];  Service: Urology;  Laterality: N/A;  . CYSTOSCOPY N/A 10/21/2013   Procedure: CYSTOSCOPY FLEXIBLE;  Surgeon: Bernestine Amass, MD;  Location: [facility];  Service: Urology;  Laterality: N/A;  . CYSTOSCOPY W/ URETERAL STENT PLACEMENT Right 06/01/2020   Procedure: CYSTOSCOPY WITH RETROGRADE PYELOGRAM/URETERAL STENT Exchange;  Surgeon: Hollice Espy, MD;  Location: [facility];  Service: Urology;  Laterality:  Right;  Marland Kitchen CYSTOSCOPY WITH BIOPSY Right 01/06/2020   Procedure: CYSTOSCOPY WITH BIOPSY;  Surgeon: Hollice Espy, MD;  Location: [facility];  Service: Urology;  Laterality: Right;  . CYSTOSCOPY/URETEROSCOPY/HOLMIUM LASER/STENT PLACEMENT Right 01/06/2020   Procedure: CYSTOSCOPY/URETEROSCOPY/HOLMIUM LASER/STENT PLACEMENT;  Surgeon: Hollice Espy, MD;  Location: [facility];  Service: Urology;  Laterality: Right;  . ESOPHAGOGASTRODUODENOSCOPY N/A 02/03/2021   Procedure: ESOPHAGOGASTRODUODENOSCOPY (EGD);  Surgeon: Jonathon Bellows, MD;  [facility] END[facility];  Service: Gastroenterology;  Laterality: N/A;   . HERNIA REPAIR    . INSERTION OF MESH N/A 06/17/2016   Procedure: INSERTION OF MESH;  Surgeon: Excell Seltzer, MD;  [facility]: [facility];  Service: General;  Laterality: N/A;  . UMBILICAL HERNIA REPAIR N/A 06/17/2016   Procedure: REPAIR UMBILICAL HERNIA WITH MESH;  Surgeon: Excell Seltzer, MD;  Location: [facility];  Service: General;  Laterality: N/A;  . WRIST SURGERY Right     Prior to Admission medications   Medication Sig Start Date End Date Taking? Authorizing Provider  ALPRAZolam Duanne Moron) 0.5 MG tablet Take 0.5 mg by mouth 2 (two) times daily as needed for anxiety. 01/28/21  Yes [provider]  ARIPiprazole (ABILIFY) 5 MG tablet Take 5 mg by mouth daily. 01/28/21  Yes [provider]  aspirin 81 MG tablet Take 81 mg by mouth daily.   Yes [provider]  busPIRone (BUSPAR) 15 MG tablet Take 15 mg by mouth 2 (two) times daily. 10/07/20  Yes [provider]  DULoxetine (CYMBALTA) 60 MG capsule Take 60 mg by mouth 2 (two) times daily.  04/23/20  Yes [provider]  furosemide (LASIX) 40 MG tablet Take 40 mg by mouth daily as needed for edema.    Yes [provider]  omeprazole (PRILOSEC) 20 MG capsule Take 1 capsule (20 mg total) by mouth 2 (two) times daily. 02/03/21 02/03/22 Yes Jonathon Bellows, MD  oxyCODONE-acetaminophen (PERCOCET) 10-325 MG tablet Take 1 tablet by mouth every 4 (four) hours as needed for pain. Patient taking differently: Take 1-2 tablets by mouth every 4 (four) hours as needed for pain. Max 8 per day 01/06/20  Yes Hollice Espy, MD  simvastatin (ZOCOR) 80 MG tablet Take 1 tablet (80 mg total) by mouth every evening. 09/07/20  Yes Biagio Borg, MD  traZODone (DESYREL) 50 MG tablet TAKE ONE-HALF TO ONE TABLET BY MOUTH AT BEDTIME AS NEEDED FOR SLEEP 11/05/20  Yes Biagio Borg, MD  albuterol (VENTOLIN HFA) 108 (90 Base) MCG/ACT inhaler INHALE 2 PUFFS INTO THE LUNGS 4 TIMES DAILY AS NEEDED 10/19/20   Biagio Borg, MD  meloxicam  (MOBIC) 15 MG tablet Take 15 mg by mouth daily. Patient not taking: Reported on 03/09/2021 10/07/20   [provider]  potassium chloride (K-DUR) 10 MEQ tablet TAKE 2 TABLETS BY MOUTH PER DAY ON DAYS TAKING FUROSEMIDE Patient not taking: Reported on 03/09/2021 09/10/18   Biagio Borg, MD    Allergies as of 02/15/2021 - Review Complete 02/03/2021  Allergen Reaction Noted  . Cyclobenzaprine  05/11/2020  . Seroquel [quetiapine] Hives 06/01/2015    Family History  Problem Relation Age of Onset  . Prostate cancer Father   . Emphysema Father   . Hyperlipidemia Father   . Hyperlipidemia Mother     Social History   Socioeconomic History  . Marital status: Married    Spouse name: Not on file  . Number of children: Not on file  . Years of education: Not on file  . Highest education level: Not  on file  Occupational History  . Occupation: retired Nurse, mental health  . Smoking status: Former Smoker    Packs/day: 0.50    Years: 40.00    Pack years: 20.00    Types: Cigarettes    Quit date: 12/27/1995    Years since quitting: 25.2  . Smokeless tobacco: Never Used  Vaping Use  . Vaping Use: Never used  Substance and Sexual Activity  . Alcohol use: No  . Drug use: No  . Sexual activity: Yes  Other Topics Concern  . Not on file  Social History Narrative  . Not on file   Social Determinants of Health   Financial Resource Strain: Not on file  Food Insecurity: Not on file  Transportation Needs: Not on file  Physical Activity: Not on file  Stress: Not on file  Social Connections: Not on file  Intimate Partner Violence: Not on file    Review of Systems: See HPI, otherwise negative ROS  Physical Exam: BP 114/71   Pulse 67   Temp (!) 96.5 F (35.8 C) (Temporal)   Resp 16   Ht 5\' 10"  (1.778 m)   Wt 86.2 kg   SpO2 100%   BMI 27.26 kg/m  General:   Alert,  pleasant and cooperative in NAD Head:  Normocephalic and atraumatic. Neck:  Supple; no masses or  thyromegaly. Lungs:  Clear throughout to auscultation, normal respiratory effort.    Heart:  +S1, +S2, Regular rate and rhythm, No edema. Abdomen:  Soft, nontender and nondistended. Normal bowel sounds, without guarding, and without rebound.   Neurologic:  Alert and  oriented x4;  grossly normal neurologically.  Impression/Plan: Albert Wade is here for an endoscopy  to be performed for  evaluation of dysphagia    Risks, benefits, limitations, and alternatives regarding endoscopy have been reviewed with the patient.  Questions have been answered.  All parties agreeable.   Jonathon Bellows, MD  03/09/2021, 9:37 AM

## 2021-03-09 NOTE — Anesthesia Postprocedure Evaluation (Signed)
Anesthesia Post Note  Patient: Albert Wade  Procedure(s) Performed: ESOPHAGOGASTRODUODENOSCOPY (EGD) WITH PROPOFOL (N/A )  Patient location during evaluation: Endoscopy Anesthesia Type: General Level of consciousness: awake and alert Pain management: pain level controlled Vital Signs Assessment: post-procedure vital signs reviewed and stable Respiratory status: spontaneous breathing, nonlabored ventilation, respiratory function stable and patient connected to nasal cannula oxygen Cardiovascular status: blood pressure returned to baseline and stable Postop Assessment: no apparent nausea or vomiting Anesthetic complications: no   No complications documented.   Last Vitals:  Vitals:   03/09/21 1013 03/09/21 1023  BP: (!) 143/80 (!) 162/84  Pulse:    Resp:    Temp:    SpO2:      Last Pain:  Vitals:   03/09/21 1033  TempSrc:   PainSc: 0-No pain                 Precious Haws Piscitello

## 2021-03-09 NOTE — Op Note (Signed)
[facility] Patient Name: Albert Wade Procedure Date: 03/09/2021 9:40 AM [name]: [id number] Account #: [id number] Date of Birth: December 15, 1946 Admit Type: Outpatient Age: 75 Room: [facility] ENDO ROOM 2 Gender: Male Note Status: Finalized Procedure:             Upper GI endoscopy Indications:           Dysphagia Providers:             Jonathon Bellows MD, MD Referring MD:          Biagio Borg, MD (Referring MD) Medicines:             Monitored Anesthesia Care Complications:         No immediate complications. Procedure:             Pre-Anesthesia Assessment:                        - Prior to the procedure, a History and Physical was                         performed, and patient medications, allergies and                         sensitivities were reviewed. The patient's tolerance                         of previous anesthesia was reviewed.                        - The risks and benefits of the procedure and the                         sedation options and risks were discussed with the                         patient. All questions were answered and informed                         consent was obtained.                        - ASA Grade Assessment: II - A patient with mild                         systemic disease.                        After obtaining informed consent, the endoscope was                         passed under direct vision. Throughout the procedure,                         the patient's blood pressure, pulse, and oxygen                         saturations were monitored continuously. The Endoscope                         was introduced through the mouth, and advanced  to the                         third part of duodenum. The upper GI endoscopy was                         accomplished with ease. The patient tolerated the                         procedure well. Findings:      The stomach was normal.      The examined duodenum was normal.       The cardia and gastric fundus were normal on retroflexion.      One benign-appearing, intrinsic mild stenosis was found in the lower       third of the esophagus. This stenosis measured 1.5 cm (inner diameter) x       less than one cm (in length). The stenosis was traversed. A TTS dilator       was passed through the scope. Dilation with a 15-16.5-18 mm balloon       dilator was performed to 18 mm. The dilation site was examined and       showed moderate mucosal disruption. Biopsies were taken with a cold       forceps for histology. Impression:            - Normal stomach.                        - Normal examined duodenum.                        - Benign-appearing esophageal stenosis. Dilated.                         Biopsied. Recommendation:        - Discharge patient to home (with escort).                        - Resume previous diet.                        - Continue present medications.                        - Await pathology results.                        - Return to my office in 4 weeks. Procedure Code(s):     --- Professional ---                        [phone], Esophagogastroduodenoscopy, flexible,                         transoral; with transendoscopic balloon dilation of                         esophagus (less than 30 mm diameter)                        43239, 59, Esophagogastroduodenoscopy, flexible,  transoral; with biopsy, single or multiple Diagnosis Code(s):     --- Professional ---                        K22.2, Esophageal obstruction                        R13.10, Dysphagia, unspecified CPT copyright 2019 American Medical Association. All rights reserved. The codes documented in this report are preliminary and upon coder review may  be revised to meet current compliance requirements. Jonathon Bellows, MD Jonathon Bellows MD, MD 03/09/2021 10:02:47 AM This report has been signed electronically. Number of Addenda: 0 Note Initiated On: 03/09/2021 9:40  AM Estimated Blood Loss:  Estimated blood loss: none.      [facility]

## 2021-03-09 NOTE — Transfer of Care (Signed)
Immediate Anesthesia Transfer of Care Note  Patient: Albert Wade  Procedure(s) Performed: ESOPHAGOGASTRODUODENOSCOPY (EGD) WITH PROPOFOL (N/A )  Patient Location: Endoscopy Unit  Anesthesia Type:General  Level of Consciousness: drowsy  Airway & Oxygen Therapy: Patient Spontanous Breathing  Post-op Assessment: Report given to RN and Post -op Vital signs reviewed and stable  Post vital signs: Reviewed and stable  Last Vitals:  Vitals Value Taken Time  BP 114/57 03/09/21 1004  Temp 36.4 C 03/09/21 1003  Pulse 61 03/09/21 1005  Resp 10 03/09/21 1005  SpO2 91 % 03/09/21 1005  Vitals shown include unvalidated device data.  Last Pain:  Vitals:   03/09/21 1003  TempSrc: Temporal  PainSc: Asleep         Complications: No complications documented.

## 2021-03-10 ENCOUNTER — Ambulatory Visit: Payer: Self-pay | Admitting: Urology

## 2021-03-10 ENCOUNTER — Encounter: Payer: Self-pay | Admitting: Gastroenterology

## 2021-03-10 LAB — SURGICAL PATHOLOGY

## 2021-03-25 ENCOUNTER — Ambulatory Visit: Payer: Medicare Other | Admitting: Gastroenterology

## 2021-03-29 ENCOUNTER — Encounter: Payer: Self-pay | Admitting: Gastroenterology

## 2021-04-08 ENCOUNTER — Other Ambulatory Visit: Payer: Self-pay

## 2021-04-08 ENCOUNTER — Encounter: Payer: Self-pay | Admitting: Gastroenterology

## 2021-04-08 ENCOUNTER — Ambulatory Visit: Payer: Medicare Other | Admitting: Gastroenterology

## 2021-04-08 VITALS — BP 147/68 | HR 72 | Ht 70.0 in | Wt 199.4 lb

## 2021-04-08 DIAGNOSIS — T402X5A Adverse effect of other opioids, initial encounter: Secondary | ICD-10-CM

## 2021-04-08 DIAGNOSIS — K2100 Gastro-esophageal reflux disease with esophagitis, without bleeding: Secondary | ICD-10-CM

## 2021-04-08 DIAGNOSIS — K5903 Drug induced constipation: Secondary | ICD-10-CM | POA: Diagnosis not present

## 2021-04-08 MED ORDER — OMEPRAZOLE 20 MG PO CPDR: 20.0000 mg | DELAYED_RELEASE_CAPSULE | Freq: Two times a day (BID) | ORAL | 3 refills | Status: DC

## 2021-04-08 MED ORDER — NA SULFATE-K SULFATE-MG SULF 17.5-3.13-1.6 GM/177ML PO SOLN: 1.0000 | Freq: Once | ORAL | 0 refills | Status: AC

## 2021-04-08 NOTE — Progress Notes (Signed)
Jonathon Bellows MD, MRCP(U.K) [address]  [place]  [place], [place] [address]  Main: [phone]  Fax: [phone]   Primary Care Physician: Biagio Borg, MD  Primary Gastroenterologist:  Dr. Jonathon Bellows   Chief Complaint  Patient presents with  . New Patient (Initial Visit)    Hosp f/u    HPI: Albert Wade is a 75 y.o. male presented to the emergency room on 02/03/2021 with food impaction.  I performed an EGD on 02/03/2021 and extracted a bolus of food.  There is a large amount of food residue in the gastric fundus.  LA grade C esophagitis was noted.  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  He is here today for follow-up.  Since the dilation he has been doing well.  No complaints of swallowing.  He takes Prilosec 20 mg twice a day.  No heartburn.  He also said he today he had issues with constipation for over 20 years after he had a back surgery for which she takes oxycodone.  Does not have a bowel movement for a week at a time.  Takes MiraLAX which helps at times and at times it does not.  Last colonoscopy was over 9 years back.  No family history of colon cancer or polyps.  No personal history of colon polyps.  Current Outpatient Medications  Medication Sig Dispense Refill  . albuterol (VENTOLIN HFA) 108 (90 Base) MCG/ACT inhaler INHALE 2 PUFFS INTO THE LUNGS 4 TIMES DAILY AS NEEDED 8.5 g 2  . ALPRAZolam (XANAX) 0.5 MG tablet Take 0.5 mg by mouth 2 (two) times daily as needed for anxiety.    Marland Kitchen aspirin 81 MG tablet Take 81 mg by mouth daily.    . DULoxetine (CYMBALTA) 60 MG capsule Take 60 mg by mouth 2 (two) times daily.     . furosemide (LASIX) 40 MG tablet Take 40 mg by mouth daily as needed for edema.     Marland Kitchen omeprazole (PRILOSEC) 20 MG capsule Take 1 capsule (20 mg total) by mouth 2 (two) times daily. 60 capsule 1  . simvastatin  (ZOCOR) 80 MG tablet Take 1 tablet (80 mg total) by mouth every evening. 90 tablet 1  . traZODone (DESYREL) 50 MG tablet TAKE ONE-HALF TO ONE TABLET BY MOUTH AT BEDTIME AS NEEDED FOR SLEEP 90 tablet 1  . ARIPiprazole (ABILIFY) 5 MG tablet Take 5 mg by mouth daily. (Patient not taking: Reported on 04/08/2021)    . busPIRone (BUSPAR) 15 MG tablet Take 15 mg by mouth 2 (two) times daily. (Patient not taking: Reported on 04/08/2021)    . meloxicam (MOBIC) 15 MG tablet Take 15 mg by mouth daily. (Patient not taking: No sig reported)    . oxyCODONE-acetaminophen (PERCOCET) 10-325 MG tablet Take 1 tablet by mouth every 4 (four) hours as needed for pain. (Patient taking differently: Take 1-2 tablets by mouth every 4 (four) hours as needed for pain. Max 8 per day) 30 tablet 0  . potassium chloride (K-DUR) 10 MEQ tablet TAKE 2 TABLETS BY MOUTH PER DAY ON DAYS TAKING FUROSEMIDE (Patient not taking: No sig reported) 180 tablet 3   No current facility-administered medications for this visit.    Allergies as of 04/08/2021 - Review Complete 04/08/2021  Allergen Reaction Noted  . Cyclobenzaprine  05/11/2020  . Seroquel [quetiapine] Hives  06/01/2015    ROS:  General: Negative for anorexia, weight loss, fever, chills, fatigue, weakness. ENT: Negative for hoarseness, difficulty swallowing , nasal congestion. CV: Negative for chest pain, angina, palpitations, dyspnea on exertion, peripheral edema.  Respiratory: Negative for dyspnea at rest, dyspnea on exertion, cough, sputum, wheezing.  GI: See history of present illness. GU:  Negative for dysuria, hematuria, urinary incontinence, urinary frequency, nocturnal urination.  Endo: Negative for unusual weight change.    Physical Examination:   BP (!) 147/68 (BP Location: Left Arm, Patient Position: Sitting, Cuff Size: Large)   Pulse 72   Ht 5\' 10"  (1.778 m)   Wt 199 lb 6.4 oz (90.4 kg)   BMI 28.61 kg/m   General: Well-nourished, well-developed in no acute  distress.  Eyes: No icterus. Conjunctivae pink. Mouth: Oropharyngeal mucosa moist and pink , no lesions erythema or exudate. Abdomen: Bowel sounds are normal, nontender, nondistended, no hepatosplenomegaly or masses, no abdominal bruits or hernia , no rebound or guarding.   Extremities: No lower extremity edema. No clubbing or deformities. Neuro: Alert and oriented x 3.  Grossly intact. Skin: Warm and dry, no jaundice.   Psych: Alert and cooperative, normal mood and affect.   Imaging Studies: No results found.  Assessment and Plan:   Albert Wade is a 75 y.o. y/o male who presented to the hospital in February 2022 with food impaction and severe esophagitis.  Stricture was noted.  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  He has a history suggestive of GERD with esophagitis.  Also has severe constipation likely secondary to use of oxycodone.  Last colonoscopy over 9 years back.  Plan 1. GERD : Counseled on life style changes, suggest to use PPI first thing in the morning on empty stomach and eat 30 minutes after. Advised on the use of a wedge pillow at night , avoid meals for 2 hours prior to bed time. Weight loss .  Due to the severe esophagitis and stricturing noted he will very likely need to be on a PPI long-term.  Continue Prilosec 20 mg twice daily.  2.  Severe constipation likely secondary to opioid use.  If possible decrease use of opiates.  Commence on Linzess to 290 micrograms daily.  Samples will be provided for 1 week.  If it works he will need to call in for prescription.  I will perform a colonoscopy to rule out any luminal obstruction  I have discussed alternative options, risks & benefits,  which include, but are not limited to, bleeding, infection, perforation,respiratory complication & drug reaction.  The patient agrees with this plan & written consent will be obtained.  \     Dr Jonathon Bellows  MD,MRCP  [facility]) Follow up in 8 weeks

## 2021-04-08 NOTE — Progress Notes (Signed)
4 SAMPLES OS LINZESS 290 MCG GIVEN IN OFFICE.

## 2021-04-19 ENCOUNTER — Encounter: Payer: Self-pay | Admitting: Gastroenterology

## 2021-04-19 ENCOUNTER — Telehealth: Payer: Self-pay

## 2021-04-19 NOTE — Telephone Encounter (Signed)
Patient Wife has Covid-19. Called to cancel his upcoming procedure that is scheduled for 04/20/21. Will call back to reschedule.

## 2021-04-20 ENCOUNTER — Encounter: Payer: Self-pay | Admitting: Internal Medicine

## 2021-04-20 ENCOUNTER — Ambulatory Visit: Admission: RE | Admit: 2021-04-20 | Payer: Medicare Other | Source: Home / Self Care | Admitting: Gastroenterology

## 2021-04-20 ENCOUNTER — Encounter: Admission: RE | Payer: Self-pay | Source: Home / Self Care

## 2021-04-20 SURGERY — COLONOSCOPY WITH PROPOFOL
Anesthesia: General

## 2021-04-20 NOTE — Telephone Encounter (Signed)
Patient had notified Endo Unit to cancel procedure due to wife being sick. Spoke with Albert Wade and they were aware of cancellation.

## 2021-04-21 ENCOUNTER — Encounter: Payer: Self-pay | Admitting: Internal Medicine

## 2021-04-21 NOTE — Telephone Encounter (Signed)
Patients wife calling, states the patient tested positive today

## 2021-04-22 ENCOUNTER — Other Ambulatory Visit (HOSPITAL_COMMUNITY): Payer: Self-pay

## 2021-04-22 MED ORDER — NIRMATRELVIR/RITONAVIR (PAXLOVID)TABLET
3.0000 | ORAL_TABLET | Freq: Two times a day (BID) | ORAL | 0 refills | Status: DC
  Filled 2021-04-22: qty 30, 5d supply, fill #0

## 2021-04-22 MED ORDER — NIRMATRELVIR/RITONAVIR (PAXLOVID)TABLET: 3.0000 | ORAL_TABLET | Freq: Two times a day (BID) | ORAL | 0 refills | Status: AC

## 2021-04-22 MED ORDER — NIRMATRELVIR/RITONAVIR (PAXLOVID)TABLET: 3.0000 | ORAL_TABLET | Freq: Two times a day (BID) | ORAL | 0 refills | Status: DC

## 2021-04-22 NOTE — Telephone Encounter (Signed)
Ok to contact pt  Ok for [employer]  - the antibiotic for covid  Pt should HOLD the simvastatin while taking this and only restart 3 days AFTER he is done with the paxlovid

## 2021-04-22 NOTE — Telephone Encounter (Signed)
Patients wife called and is requesting that Paxlovid be sent to [place], [place], [place] [address].    Phone: [phone]

## 2021-04-22 NOTE — Addendum Note (Signed)
Addended by: Biagio Borg on: 04/22/2021 05:09 PM   Modules accepted: Orders

## 2021-04-22 NOTE — Telephone Encounter (Signed)
Patients wife called and said that their pharmacy does not carry Paxlovid. She was wondering if something else could be called in. It can be sent to [place], [place]. Please advise

## 2021-04-27 ENCOUNTER — Other Ambulatory Visit (HOSPITAL_COMMUNITY): Payer: Self-pay

## 2021-04-30 NOTE — Telephone Encounter (Signed)
Patients wife called and said that the patient finished taking Paxlovid but is still not feeling better. She was wondering what else to do. Please advise

## 2021-05-03 DIAGNOSIS — G894 Chronic pain syndrome: Secondary | ICD-10-CM | POA: Diagnosis not present

## 2021-05-03 DIAGNOSIS — M25579 Pain in unspecified ankle and joints of unspecified foot: Secondary | ICD-10-CM | POA: Diagnosis not present

## 2021-05-03 DIAGNOSIS — K5909 Other constipation: Secondary | ICD-10-CM | POA: Diagnosis not present

## 2021-05-03 DIAGNOSIS — M48061 Spinal stenosis, lumbar region without neurogenic claudication: Secondary | ICD-10-CM | POA: Diagnosis not present

## 2021-05-03 DIAGNOSIS — M542 Cervicalgia: Secondary | ICD-10-CM | POA: Diagnosis not present

## 2021-05-12 ENCOUNTER — Ambulatory Visit: Payer: Medicare Other | Admitting: Dermatology

## 2021-05-31 ENCOUNTER — Telehealth: Payer: Self-pay | Admitting: Gastroenterology

## 2021-05-31 ENCOUNTER — Other Ambulatory Visit: Payer: Self-pay

## 2021-05-31 DIAGNOSIS — T402X5A Adverse effect of other opioids, initial encounter: Secondary | ICD-10-CM

## 2021-05-31 DIAGNOSIS — K5903 Drug induced constipation: Secondary | ICD-10-CM

## 2021-05-31 NOTE — Telephone Encounter (Signed)
Called patient back and he wanted to reschedule his procedure with Dr. Vicente Males as soon as possible. Therefore, I went ahead and rescheduled him for June 15 th as he and his wife agreed. I told them both that I would be sending his instructions via MyChart as he was active and they both agreed. I told them that once they read their instructions and had questions for me, to either call me or send me a MyChart message. They both agreed.

## 2021-05-31 NOTE — Telephone Encounter (Signed)
Please call to reschedule procedure  

## 2021-06-07 ENCOUNTER — Ambulatory Visit: Payer: Medicare Other

## 2021-06-08 ENCOUNTER — Ambulatory Visit: Payer: Medicare Other | Admitting: Gastroenterology

## 2021-06-09 ENCOUNTER — Ambulatory Visit: Payer: Medicare Other | Admitting: Certified Registered"

## 2021-06-09 ENCOUNTER — Encounter
Admission: RE | Disposition: A | Discharge status: Home / Self Care | Payer: Self-pay | Source: Home / Self Care | Attending: Gastroenterology

## 2021-06-09 ENCOUNTER — Ambulatory Visit
Admission: RE | Admit: 2021-06-09 | Discharge: 2021-06-09 | Disposition: A | Discharge status: Home / Self Care | Payer: Medicare Other | Attending: Gastroenterology | Admitting: Gastroenterology

## 2021-06-09 DIAGNOSIS — Z7982 Long term (current) use of aspirin: Secondary | ICD-10-CM | POA: Diagnosis not present

## 2021-06-09 DIAGNOSIS — Z888 Allergy status to other drugs, medicaments and biological substances status: Secondary | ICD-10-CM | POA: Insufficient documentation

## 2021-06-09 DIAGNOSIS — K5900 Constipation, unspecified: Secondary | ICD-10-CM | POA: Diagnosis present

## 2021-06-09 DIAGNOSIS — T402X5A Adverse effect of other opioids, initial encounter: Secondary | ICD-10-CM | POA: Diagnosis not present

## 2021-06-09 DIAGNOSIS — Z79899 Other long term (current) drug therapy: Secondary | ICD-10-CM | POA: Insufficient documentation

## 2021-06-09 DIAGNOSIS — K635 Polyp of colon: Secondary | ICD-10-CM | POA: Diagnosis not present

## 2021-06-09 DIAGNOSIS — D124 Benign neoplasm of descending colon: Secondary | ICD-10-CM | POA: Insufficient documentation

## 2021-06-09 DIAGNOSIS — K5903 Drug induced constipation: Secondary | ICD-10-CM

## 2021-06-09 DIAGNOSIS — Z87891 Personal history of nicotine dependence: Secondary | ICD-10-CM | POA: Diagnosis not present

## 2021-06-09 DIAGNOSIS — G4733 Obstructive sleep apnea (adult) (pediatric): Secondary | ICD-10-CM | POA: Diagnosis not present

## 2021-06-09 HISTORY — PX: COLONOSCOPY WITH PROPOFOL: SHX5780

## 2021-06-09 SURGERY — COLONOSCOPY WITH PROPOFOL
Anesthesia: General

## 2021-06-09 MED ORDER — PROPOFOL 10 MG/ML IV BOLUS
INTRAVENOUS | Status: DC | PRN
  Administered 2021-06-09: 70 mg via INTRAVENOUS
  Administered 2021-06-09: 30 mg via INTRAVENOUS

## 2021-06-09 MED ORDER — LIDOCAINE 2% (20 MG/ML) 5 ML SYRINGE
INTRAMUSCULAR | Status: DC | PRN
  Administered 2021-06-09: 25 mg via INTRAVENOUS

## 2021-06-09 MED ORDER — GLYCOPYRROLATE 0.2 MG/ML IJ SOLN
INTRAMUSCULAR | Status: AC
  Filled 2021-06-09: qty 1

## 2021-06-09 MED ORDER — PROPOFOL 500 MG/50ML IV EMUL
INTRAVENOUS | Status: DC | PRN
  Administered 2021-06-09: 120 ug/kg/min via INTRAVENOUS

## 2021-06-09 MED ORDER — FENTANYL CITRATE (PF) 100 MCG/2ML IJ SOLN
INTRAMUSCULAR | Status: DC | PRN
  Administered 2021-06-09 (×2): 50 ug via INTRAVENOUS

## 2021-06-09 MED ORDER — FENTANYL CITRATE (PF) 100 MCG/2ML IJ SOLN
INTRAMUSCULAR | Status: AC
  Filled 2021-06-09: qty 2

## 2021-06-09 MED ORDER — SODIUM CHLORIDE 0.9 % IV SOLN
INTRAVENOUS | Status: DC
  Administered 2021-06-09: 20 mL/h via INTRAVENOUS

## 2021-06-09 NOTE — H&P (Signed)
Jonathon Bellows, MD [address], [place], [place], [place], [address] [address], [place], [place], [place], [address] Phone: [phone]  Fax: [phone]  Primary Care Physician:  Biagio Borg, MD   Pre-Procedure History & Physical: HPI:  Albert Wade is a 75 y.o. male is here for an colonoscopy.   Past Medical History:  Diagnosis Date   Acute bronchitis 01/01/2015   Acute upper respiratory infection 05/19/2016   Anginal pain (HCC)    Anxiety    Arthritis    back,wrists,hx. 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   Confusion 04/27/2017   COPD (chronic obstructive pulmonary disease) ([place])    Depression    Encounter for preventative adult health care exam with abnormal findings 12/20/2013   Fever 04/27/2017   GERD (gastroesophageal reflux disease)    Gynecomastia 05/12/2015   H/O hiatal hernia    pts wife states pt does not have    Heart murmur    History of kidney stones    Hypercholesteremia    Hyperglycemia 05/19/2016   HYPERLIPIDEMIA 04/29/2008   Qualifier: Diagnosis of  By: Jim Like     Impaired glucose tolerance 12/20/2013   Insomnia 04/27/2017   Lumbosacral radiculopathy 10/14/2016   MI (myocardial infarction) [facility])    mid-'90's   MYOCARDIAL INFARCTION 04/29/2008   Qualifier: History of  By: Jim Like     OSA (obstructive sleep apnea) 05/13/2011   Split night study 2011:  AHI 21/hr with obstructive and central events.  Placed on cpap and titrated to 13cm with reasonable control, but attempts to increase pressure increased the number of central events.  Medium resmed quattro full face mask used.     Peripheral neuropathy     peripheral neuropathy   Phimosis/adherent prepuce 10/21/2013   Pneumonia    Prostate cancer ([place])    Shortness of breath 11/05/2010   Qualifier: Diagnosis of  By: Marilynne Halsted, RN, BSN, Jacquelyn     Sleep apnea    mild-no cpap, unable to tolerate   Squamous cell carcinoma of skin 10/29/2020   right mid volar forearm   Umbilical hernia    Umbilical hernia without obstruction and without gangrene 05/19/2016   URI (upper respiratory infection) 04/27/2017   UTI (urinary tract infection) 12/01/2016   Varicose veins    Varicose veins of bilateral lower extremities with other complications 48/18/5631   Wheezing 01/01/2015    Past Surgical History:  Procedure Laterality Date   ANGIOPLASTY     APPENDECTOMY     BACK SURGERY     x3   CARDIAC CATHETERIZATION     11'11   CIRCUMCISION N/A 10/21/2013   Procedure: CIRCUMCISION ADULT;  Surgeon: Bernestine Amass, MD;  Location: [facility];  Service: Urology;  Laterality: N/A;   CYSTOSCOPY N/A 10/21/2013   Procedure: CYSTOSCOPY FLEXIBLE;  Surgeon: Bernestine Amass, MD;  Location: [facility];  Service: Urology;  Laterality: N/A;   CYSTOSCOPY W/ URETERAL STENT PLACEMENT Right 06/01/2020   Procedure: CYSTOSCOPY WITH RETROGRADE PYELOGRAM/URETERAL STENT Exchange;  Surgeon: Hollice Espy, MD;  Location: [facility] ORS;  Service: Urology;  Laterality: Right;  CYSTOSCOPY WITH BIOPSY Right 01/06/2020   Procedure: CYSTOSCOPY WITH BIOPSY;  Surgeon: Hollice Espy, MD;  Location: [facility];  Service: Urology;  Laterality: Right;   CYSTOSCOPY/URETEROSCOPY/HOLMIUM LASER/STENT PLACEMENT Right 01/06/2020   Procedure: CYSTOSCOPY/URETEROSCOPY/HOLMIUM LASER/STENT PLACEMENT;  Surgeon: Hollice Espy, MD;  Location: [facility];  Service: Urology;  Laterality: Right;   ESOPHAGOGASTRODUODENOSCOPY N/A 02/03/2021   Procedure: ESOPHAGOGASTRODUODENOSCOPY (EGD);  Surgeon: Jonathon Bellows, MD;  Location: [facility] ENDOSCOPY;  Service: Gastroenterology;  Laterality: N/A;   ESOPHAGOGASTRODUODENOSCOPY (EGD) WITH  PROPOFOL N/A 03/09/2021   Procedure: ESOPHAGOGASTRODUODENOSCOPY (EGD) WITH PROPOFOL;  Surgeon: Jonathon Bellows, MD;  [facility] END[facility]COPY;  Service: Gastroenterology;  Laterality: N/A;   HERNIA REPAIR     INSERTION OF MESH N/A 06/17/2016   Procedure: INSERTION OF MESH;  Surgeon: Excell Seltzer, MD;  Location: [facility];  Service: General;  Laterality: N/A;   UMBILICAL HERNIA REPAIR N/A 06/17/2016   Procedure: REPAIR UMBILICAL HERNIA WITH MESH;  Surgeon: Excell Seltzer, MD;  Location: [facility];  Service: General;  Laterality: N/A;   WRIST SURGERY Right     Prior to Admission medications   Medication Sig Start Date End Date Taking? Authorizing Provider  albuterol (VENTOLIN HFA) 108 (90 Base) MCG/ACT inhaler INHALE 2 PUFFS INTO THE LUNGS 4 TIMES DAILY AS NEEDED 10/19/20  Yes Biagio Borg, MD  ALPRAZolam Duanne Moron) 0.5 MG tablet Take 0.5 mg by mouth 2 (two) times daily as needed for anxiety. 01/28/21  Yes [provider]  aspirin 81 MG tablet Take 81 mg by mouth daily.   Yes [provider]  DULoxetine (CYMBALTA) 60 MG capsule Take 60 mg by mouth 2 (two) times daily.  04/23/20  Yes [provider]  furosemide (LASIX) 40 MG tablet Take 40 mg by mouth daily as needed for edema.    Yes [provider]  omeprazole (PRILOSEC) 20 MG capsule Take 1 capsule (20 mg total) by mouth 2 (two) times daily. 04/08/21 04/08/22 Yes Jonathon Bellows, MD  oxyCODONE-acetaminophen (PERCOCET) 10-325 MG tablet Take 1 tablet by mouth every 4 (four) hours as needed for pain. Patient taking differently: Take 1-2 tablets by mouth every 4 (four) hours as needed for pain. Max 8 per day 01/06/20  Yes Hollice Espy, MD  simvastatin (ZOCOR) 80 MG tablet Take 1 tablet (80 mg total) by mouth every evening. 09/07/20  Yes Biagio Borg, MD  traZODone (DESYREL) 50 MG tablet TAKE ONE-HALF TO ONE TABLET BY MOUTH AT BEDTIME AS NEEDED FOR SLEEP 11/05/20  Yes Biagio Borg, MD    Allergies as of 05/31/2021 - Review  Complete 04/19/2021  Allergen Reaction Noted   Cyclobenzaprine  05/11/2020   Seroquel [quetiapine] Hives 06/01/2015    Family History  Problem Relation Age of Onset   Prostate cancer Father    Emphysema Father    Hyperlipidemia Father    Hyperlipidemia Mother     Social History   Socioeconomic History   Marital status: Married    Spouse name: Not on file   Number of children: Not on file   Years of education: Not on file   Highest education level: Not on file  Occupational History   Occupation: retired Administrator  Tobacco Use   Smoking status: Former    Packs/day: 0.50    Years: 40.00    Pack years: 20.00    Types: Cigarettes    Quit date: 12/27/1995    Years since quitting: 25.4   Smokeless tobacco: Never  Vaping Use   Vaping Use: Never used  Substance and Sexual Activity   Alcohol use: No   Drug use: No   Sexual activity: Yes  Other Topics Concern   Not on file  Social History Narrative   Not on file   Social Determinants of Health   Financial Resource Strain: Not on file  Food Insecurity: Not on file  Transportation Needs: Not on file  Physical Activity: Not on file  Stress: Not on file  Social Connections: Not on file  Intimate Partner Violence: Not on file    Review of Systems: See HPI, otherwise negative ROS  Physical Exam: BP (!) 177/98   Pulse 80   Temp (!) 97 F (36.1 C) (Temporal)   Resp 20   Ht 5\' 10"  (1.778 m)   Wt 93.9 kg   SpO2 99%   BMI 29.70 kg/m  General:   Alert,  pleasant and cooperative in NAD Head:  Normocephalic and atraumatic. Neck:  Supple; no masses or thyromegaly. Lungs:  Clear throughout to auscultation, normal respiratory effort.    Heart:  +S1, +S2, Regular rate and rhythm, No edema. Abdomen:  Soft, nontender and nondistended. Normal bowel sounds, without guarding, and without rebound.   Neurologic:  Alert and  oriented x4;  grossly normal neurologically.  Impression/Plan: Albert Wade is here for an  colonoscopy to be performed for constipation.  Risks, benefits, limitations, and alternatives regarding  colonoscopy have been reviewed with the patient.  Questions have been answered.  All parties agreeable.   Jonathon Bellows, MD  06/09/2021, 12:57 PM

## 2021-06-09 NOTE — Anesthesia Preprocedure Evaluation (Signed)
Anesthesia Evaluation  Patient identified by [name], date of birth, ID band Patient awake    Reviewed: Allergy & Precautions, H&P , NPO status , Patient's Chart, lab work & pertinent test results  History of Anesthesia Complications Negative for: history of anesthetic complications  Airway Mallampati: III  TM Distance: <3 FB Neck ROM: limited    Dental  (+) Missing   Pulmonary shortness of breath and with exertion, sleep apnea and Continuous Positive Airway Pressure Ventilation , pneumonia, resolved, COPD, former smoker,    Pulmonary exam normal        Cardiovascular Exercise Tolerance: Good (-) angina+ CAD and + Past MI  Normal cardiovascular exam+ Valvular Problems/Murmurs      Neuro/Psych PSYCHIATRIC DISORDERS Anxiety Depression  Neuromuscular disease    GI/Hepatic hiatal hernia, GERD  Medicated and Controlled,(+)     substance abuse  ,   Endo/Other  negative endocrine ROS  Renal/GU negative Renal ROS  negative genitourinary   Musculoskeletal  (+) Arthritis , narcotic dependent  Abdominal   Peds  Hematology negative hematology ROS (+)   Anesthesia Other Findings Acute bronchitis 01/01/2015   Acute upper respiratory infection 05/19/2016   Anginal pain (HCC)    Anxiety    Arthritis  back,wrists,hx. previous fractures  BPH (benign prostatic hypertrophy)    BRONCHITIS, CHRONIC 04/29/2008 Qualifier: Diagnosis of By: Halford Chessman MD, Vineet   CAD 10/16/2009 Qualifier: Diagnosis of By: Lynden Ang   CAD (coronary artery disease)  angioplasty   Chronic back pain    Chronic bronchitis    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/2016   Confusion 04/27/2017   COPD (chronic obstructive pulmonary disease) ([place])    Depression    Encounter for preventative adult health care exam with abnormal findings 12/20/2013   Fever 04/27/2017   GERD (gastroesophageal reflux disease)    Gynecomastia 05/12/2015   H/O hiatal  hernia  pts wife states pt does not have   Heart murmur    History of kidney stones    Hypercholesteremia    Hyperglycemia 05/19/2016   HYPERLIPIDEMIA 04/29/2008 Qualifier: Diagnosis of By: Jim Like   Impaired glucose tolerance 12/20/2013   Insomnia 04/27/2017   Lumbosacral radiculopathy 10/14/2016   MI (myocardial infarction) [facility])  mid-'90's  MYOCARDIAL INFARCTION 04/29/2008 Qualifier: History of By: Jim Like   OSA (obstructive sleep apnea) 05/13/2011 Split night study 2011: AHI 21/hr with obstructive and central events. Placed on cpap and titrated to 13cm with reasonable control, but attempts to increase pressure increased the number of central events. Medium resmed quattro full face mask used.      Reproductive/Obstetrics negative OB ROS                            Anesthesia Physical  Anesthesia Plan  ASA: 3  Anesthesia Plan: General   Post-op Pain Management:    Induction: Intravenous  PONV Risk Score and Plan: Propofol infusion and TIVA  Airway Management Planned: Natural Airway and Nasal Cannula  Additional Equipment:   Intra-op Plan:   Post-operative Plan:   Informed Consent: I have reviewed the patients History and Physical, chart, labs and discussed the procedure including the risks, benefits and alternatives for the proposed anesthesia with the patient or authorized representative who has indicated his/her understanding and acceptance.     Dental Advisory Given  Plan Discussed with: Anesthesiologist, CRNA and Surgeon  Anesthesia Plan Comments: (Patient consented for risks of anesthesia including but not limited to:  -  adverse reactions to medications - risk of airway placement if required - damage to eyes, teeth, lips or other oral mucosa - nerve damage due to positioning  - sore throat or hoarseness - Damage to heart, brain, nerves, lungs, other parts of body or loss of life  Patient voiced  understanding.)       Anesthesia Quick Evaluation

## 2021-06-09 NOTE — Op Note (Signed)
[facility] Gastroenterology Patient Name: Albert Wade Procedure Date: 06/09/2021 12:55 PM [name]: [id number] Account #: [id number] Date of Birth: 09/04/1946 Admit Type: Outpatient Age: 75 Room: [facility] ENDO ROOM 3 Gender: Male Note Status: Finalized Procedure:             Colonoscopy Indications:           Constipation Providers:             Jonathon Bellows MD, MD Referring MD:          Biagio Borg, MD (Referring MD) Medicines:             Monitored Anesthesia Care Complications:         No immediate complications. Procedure:             Pre-Anesthesia Assessment:                        - Prior to the procedure, a History and Physical was                         performed, and patient medications, allergies and                         sensitivities were reviewed. The patient's tolerance                         of previous anesthesia was reviewed.                        - The risks and benefits of the procedure and the                         sedation options and risks were discussed with the                         patient. All questions were answered and informed                         consent was obtained.                        - ASA Grade Assessment: II - A patient with mild                         systemic disease.                        After obtaining informed consent, the colonoscope was                         passed under direct vision. Throughout the procedure,                         the patient's blood pressure, pulse, and oxygen                         saturations were monitored continuously. The                         Colonoscope was introduced through the anus and  advanced to the the cecum, identified by appendiceal                         orifice and ileocecal valve. The colonoscopy was                         performed with ease. The patient tolerated the                         procedure well. The quality of the bowel  preparation                         was excellent. Findings:      The perianal and digital rectal examinations were normal.      A 5 mm polyp was found in the descending colon. The polyp was sessile.       The polyp was removed with a cold snare. Resection and retrieval were       complete.      The exam was otherwise without abnormality. Impression:            - One 5 mm polyp in the descending colon, removed with                         a cold snare. Resected and retrieved.                        - The examination was otherwise normal. Recommendation:        - Discharge patient to home (with escort).                        - Resume previous diet.                        - Continue present medications.                        - Await pathology results.                        - Repeat colonoscopy for surveillance based on                         pathology results. Procedure Code(s):     --- Professional ---                        [id number], Colonoscopy, flexible; with removal of                         tumor(s), polyp(s), or other lesion(s) by snare                         technique Diagnosis Code(s):     --- Professional ---                        K63.5, Polyp of colon                        K59.00, Constipation, unspecified CPT copyright 2019 American Medical Association. All rights reserved. The codes documented  in this report are preliminary and upon coder review may  be revised to meet current compliance requirements. Jonathon Bellows, MD Jonathon Bellows MD, MD 06/09/2021 1:19:53 PM This report has been signed electronically. Number of Addenda: 0 Note Initiated On: 06/09/2021 12:55 PM Scope Withdrawal Time: 0 hours 12 minutes 1 second  Total Procedure Duration: 0 hours 14 minutes 56 seconds  Estimated Blood Loss:  Estimated blood loss: none.      [facility]

## 2021-06-09 NOTE — Transfer of Care (Signed)
Immediate Anesthesia Transfer of Care Note  Patient: Albert Wade  Procedure(s) Performed: COLONOSCOPY WITH PROPOFOL  Patient Location: Endoscopy Unit  Anesthesia Type:General  Level of Consciousness: drowsy  Airway & Oxygen Therapy: Patient Spontanous Breathing and Patient connected to nasal cannula oxygen  Post-op Assessment: Report given to RN and Post -op Vital signs reviewed and stable  Post vital signs: Reviewed  Last Vitals:  Vitals Value Taken Time  BP 103/62 06/09/21 1321  Temp 36.4 C 06/09/21 1320  Pulse 66 06/09/21 1322  Resp 10 06/09/21 1322  SpO2 92 % 06/09/21 1322  Vitals shown include unvalidated device data.  Last Pain:  Vitals:   06/09/21 1320  TempSrc: Temporal  PainSc: Asleep         Complications: No notable events documented.

## 2021-06-09 NOTE — Anesthesia Postprocedure Evaluation (Signed)
Anesthesia Post Note  Patient: Albert Wade  Procedure(s) Performed: COLONOSCOPY WITH PROPOFOL  Patient location during evaluation: Phase II Anesthesia Type: General Level of consciousness: awake and alert, awake and oriented Pain management: pain level controlled Vital Signs Assessment: post-procedure vital signs reviewed and stable Respiratory status: spontaneous breathing, nonlabored ventilation and respiratory function stable Cardiovascular status: blood pressure returned to baseline and stable Postop Assessment: no apparent nausea or vomiting Anesthetic complications: no   No notable events documented.   Last Vitals:  Vitals:   06/09/21 1331 06/09/21 1341  BP: (!) 144/75 (!) 157/90  Pulse: 68 (!) 55  Resp: 16 13  Temp:    SpO2: 100% 100%    Last Pain:  Vitals:   06/09/21 1341  TempSrc:   PainSc: 0-No pain                 Phill Mutter

## 2021-06-10 ENCOUNTER — Encounter: Payer: Self-pay | Admitting: Gastroenterology

## 2021-06-10 LAB — SURGICAL PATHOLOGY

## 2021-06-14 ENCOUNTER — Encounter: Payer: Self-pay | Admitting: Gastroenterology

## 2021-06-14 DIAGNOSIS — M25579 Pain in unspecified ankle and joints of unspecified foot: Secondary | ICD-10-CM | POA: Diagnosis not present

## 2021-06-14 DIAGNOSIS — M48061 Spinal stenosis, lumbar region without neurogenic claudication: Secondary | ICD-10-CM | POA: Diagnosis not present

## 2021-06-14 DIAGNOSIS — K5909 Other constipation: Secondary | ICD-10-CM | POA: Diagnosis not present

## 2021-06-14 DIAGNOSIS — G894 Chronic pain syndrome: Secondary | ICD-10-CM | POA: Diagnosis not present

## 2021-06-22 ENCOUNTER — Encounter: Payer: Self-pay | Admitting: Internal Medicine

## 2021-06-22 ENCOUNTER — Other Ambulatory Visit: Payer: Self-pay

## 2021-06-22 MED ORDER — SIMVASTATIN 80 MG PO TABS: 80.0000 mg | ORAL_TABLET | Freq: Every evening | ORAL | 1 refills | Status: DC

## 2021-07-05 IMAGING — DX DG ABDOMEN 1V
2 series · 2 of 2 positions shown · non-contrast
Comparison: 12/04/2019

CLINICAL DATA: Evaluate ureteral stent

EXAM:
ABDOMEN - 1 VIEW

[abdomen supine (1 of 2)]
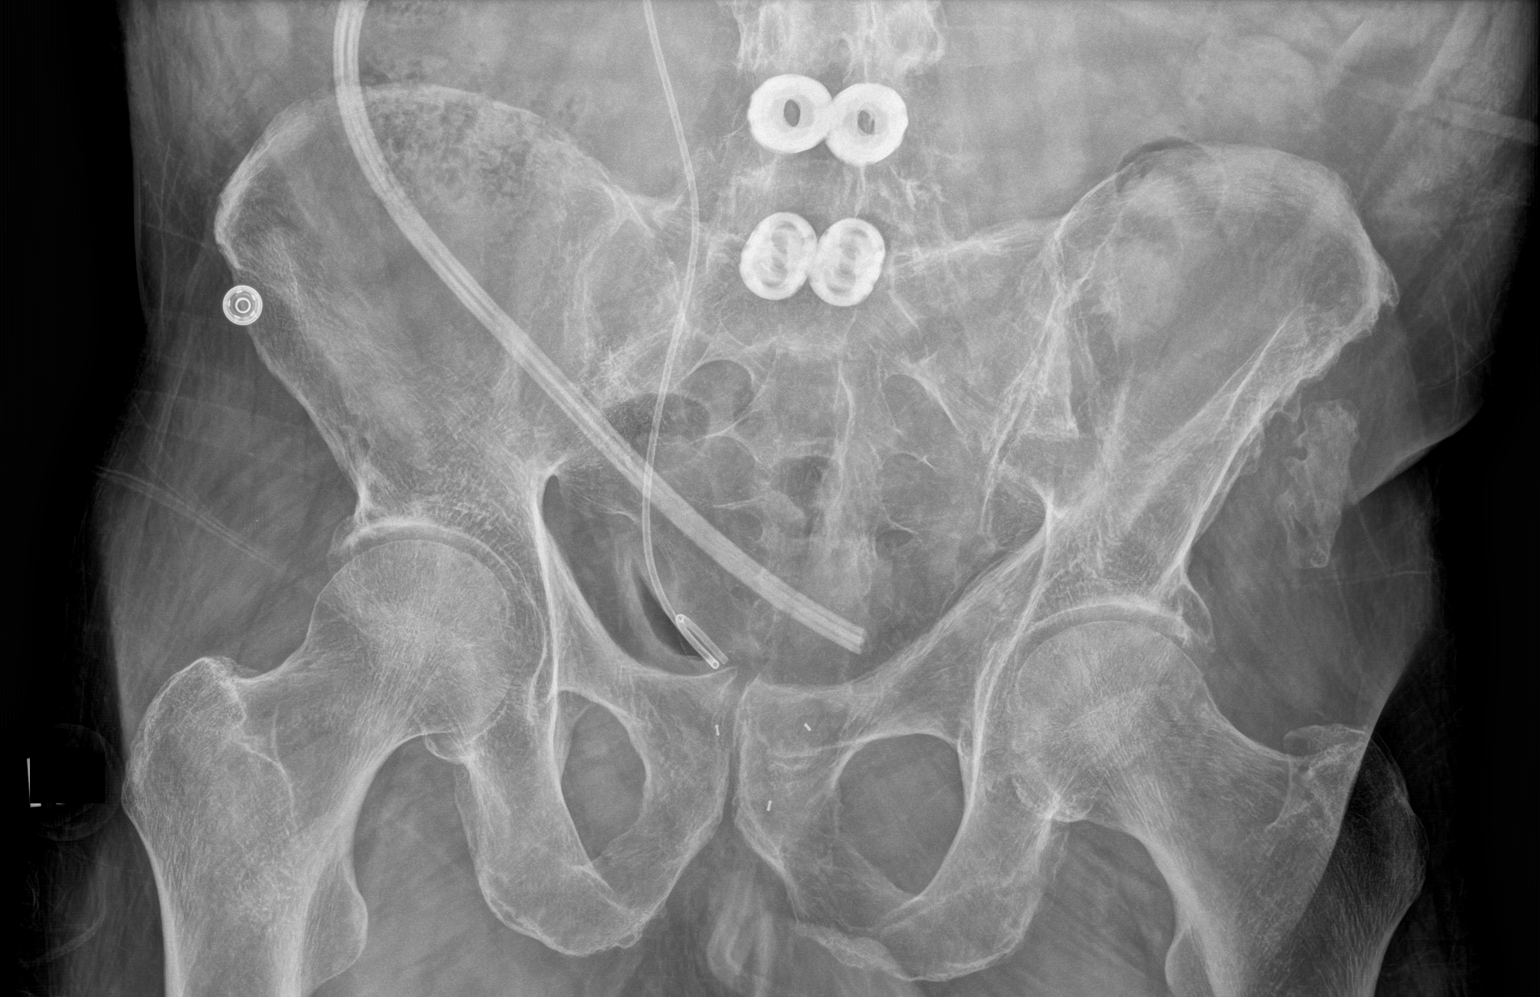

[abdomen supine (2 of 2)]
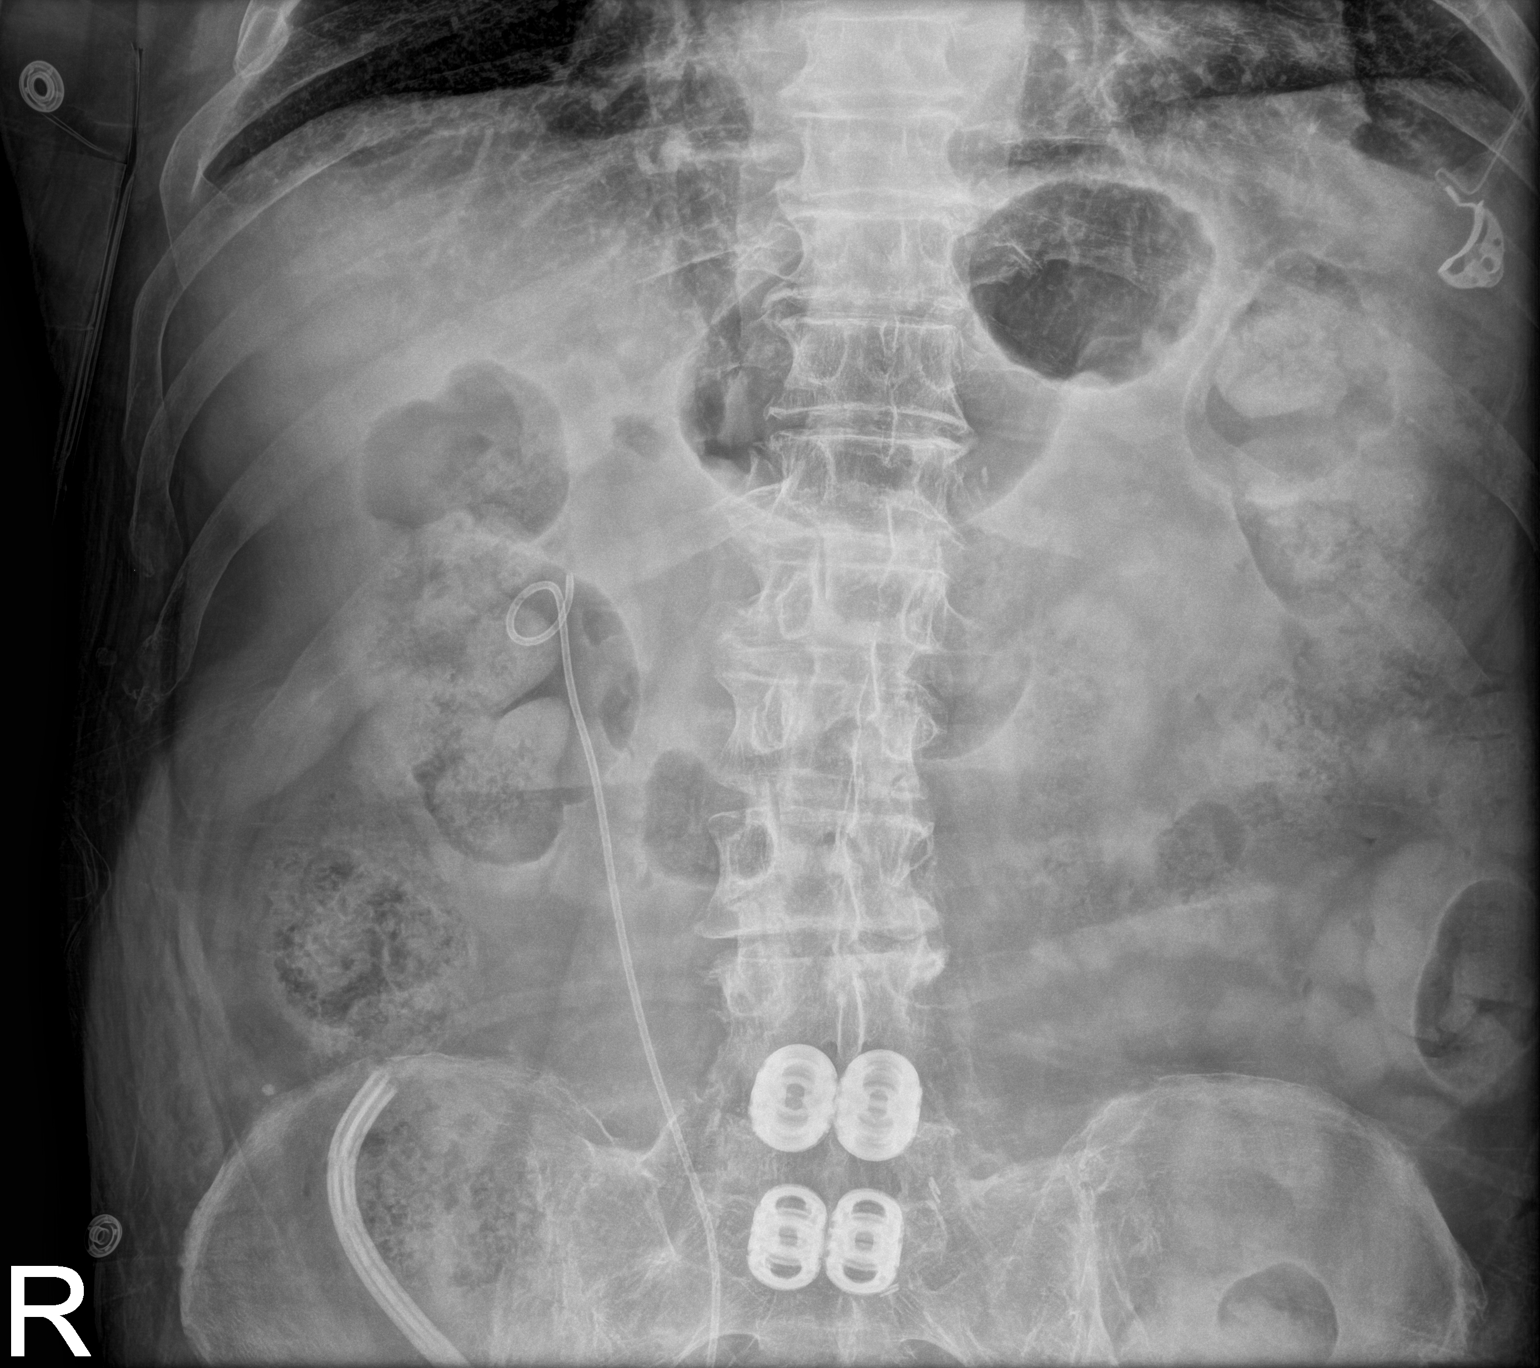

[2 of 2 positions shown; findings below may reference images not displayed]

FINDINGS: Postsurgical changes are noted in the lumbar spine. A right ureteral
stent is noted in satisfactory position. Surgical drain is noted in
the right pelvis. No acute bony abnormality is noted.
IMPRESSION: Right ureteral stent in satisfactory position.

## 2021-07-21 ENCOUNTER — Ambulatory Visit (INDEPENDENT_AMBULATORY_CARE_PROVIDER_SITE_OTHER): Payer: Medicare Other | Admitting: Internal Medicine

## 2021-07-21 ENCOUNTER — Encounter: Payer: Self-pay | Admitting: Internal Medicine

## 2021-07-21 ENCOUNTER — Other Ambulatory Visit: Payer: Self-pay

## 2021-07-21 VITALS — BP 148/82 | HR 69 | Temp 98.0°F | Ht 70.0 in | Wt 213.0 lb

## 2021-07-21 DIAGNOSIS — E7800 Pure hypercholesterolemia, unspecified: Secondary | ICD-10-CM

## 2021-07-21 DIAGNOSIS — M545 Low back pain, unspecified: Secondary | ICD-10-CM

## 2021-07-21 DIAGNOSIS — C61 Malignant neoplasm of prostate: Secondary | ICD-10-CM

## 2021-07-21 DIAGNOSIS — G8929 Other chronic pain: Secondary | ICD-10-CM | POA: Diagnosis not present

## 2021-07-21 DIAGNOSIS — I5020 Unspecified systolic (congestive) heart failure: Secondary | ICD-10-CM | POA: Insufficient documentation

## 2021-07-21 DIAGNOSIS — E538 Deficiency of other specified B group vitamins: Secondary | ICD-10-CM | POA: Diagnosis not present

## 2021-07-21 DIAGNOSIS — R739 Hyperglycemia, unspecified: Secondary | ICD-10-CM | POA: Diagnosis not present

## 2021-07-21 DIAGNOSIS — Z0001 Encounter for general adult medical examination with abnormal findings: Secondary | ICD-10-CM | POA: Diagnosis not present

## 2021-07-21 DIAGNOSIS — E559 Vitamin D deficiency, unspecified: Secondary | ICD-10-CM

## 2021-07-21 DIAGNOSIS — I5021 Acute systolic (congestive) heart failure: Secondary | ICD-10-CM | POA: Diagnosis not present

## 2021-07-21 LAB — URINALYSIS, ROUTINE W REFLEX MICROSCOPIC
Bilirubin Urine: NEGATIVE
Hgb urine dipstick: NEGATIVE
Ketones, ur: NEGATIVE
Leukocytes,Ua: NEGATIVE
Nitrite: NEGATIVE
RBC / HPF: NONE SEEN
Specific Gravity, Urine: 1.015 (ref 1.000–1.030)
Total Protein, Urine: NEGATIVE
Urine Glucose: NEGATIVE
Urobilinogen, UA: 1 (ref 0.0–1.0)
pH: 7 (ref 5.0–8.0)

## 2021-07-21 LAB — LIPID PANEL
Cholesterol: 121 mg/dL (ref 0–200)
HDL: 41.3 mg/dL
LDL Cholesterol: 56 mg/dL (ref 0–99)
NonHDL: 80.12
Total CHOL/HDL Ratio: 3
Triglycerides: 120 mg/dL (ref 0.0–149.0)
VLDL: 24 mg/dL (ref 0.0–40.0)

## 2021-07-21 LAB — CBC WITH DIFFERENTIAL/PLATELET
Basophils Absolute: 0 10*3/uL (ref 0.0–0.1)
Basophils Relative: 0.5 % (ref 0.0–3.0)
Eosinophils Absolute: 0.2 10*3/uL (ref 0.0–0.7)
Eosinophils Relative: 3.2 % (ref 0.0–5.0)
HCT: 35.6 % — ABNORMAL LOW (ref 39.0–52.0)
Hemoglobin: 12.3 g/dL — ABNORMAL LOW (ref 13.0–17.0)
Lymphocytes Relative: 19.6 % (ref 12.0–46.0)
Lymphs Abs: 1.3 10*3/uL (ref 0.7–4.0)
MCHC: 34.5 g/dL (ref 30.0–36.0)
MCV: 89.3 fl (ref 78.0–100.0)
Monocytes Absolute: 0.6 10*3/uL (ref 0.1–1.0)
Monocytes Relative: 9.3 % (ref 3.0–12.0)
Neutro Abs: 4.3 10*3/uL (ref 1.4–7.7)
Neutrophils Relative %: 67.4 % (ref 43.0–77.0)
Platelets: 178 10*3/uL (ref 150.0–400.0)
RBC: 3.99 Mil/uL — ABNORMAL LOW (ref 4.22–5.81)
RDW: 13.7 % (ref 11.5–15.5)
WBC: 6.4 10*3/uL (ref 4.0–10.5)

## 2021-07-21 LAB — HEPATIC FUNCTION PANEL
ALT: 14 U/L (ref 0–53)
AST: 20 U/L (ref 0–37)
Albumin: 3.9 g/dL (ref 3.5–5.2)
Alkaline Phosphatase: 87 U/L (ref 39–117)
Bilirubin, Direct: 0.1 mg/dL (ref 0.0–0.3)
Total Bilirubin: 0.4 mg/dL (ref 0.2–1.2)
Total Protein: 6.4 g/dL (ref 6.0–8.3)

## 2021-07-21 LAB — VITAMIN B12: Vitamin B-12: 255 pg/mL (ref 211–911)

## 2021-07-21 LAB — BRAIN NATRIURETIC PEPTIDE: Pro B Natriuretic peptide (BNP): 170 pg/mL — ABNORMAL HIGH (ref 0.0–100.0)

## 2021-07-21 LAB — VITAMIN D 25 HYDROXY (VIT D DEFICIENCY, FRACTURES): VITD: 43.53 ng/mL (ref 30.00–100.00)

## 2021-07-21 LAB — PSA: PSA: 0.01 ng/mL — ABNORMAL LOW (ref 0.10–4.00)

## 2021-07-21 LAB — HEMOGLOBIN A1C: Hgb A1c MFr Bld: 6.1 % (ref 4.6–6.5)

## 2021-07-21 LAB — TSH: TSH: 1.34 u[IU]/mL (ref 0.35–5.50)

## 2021-07-21 MED ORDER — POTASSIUM CHLORIDE ER 10 MEQ PO TBCR: 10.0000 meq | EXTENDED_RELEASE_TABLET | Freq: Every day | ORAL | 3 refills | Status: DC

## 2021-07-21 MED ORDER — FUROSEMIDE 40 MG PO TABS: 40.0000 mg | ORAL_TABLET | Freq: Two times a day (BID) | ORAL | 3 refills | Status: DC

## 2021-07-21 NOTE — Assessment & Plan Note (Signed)
Also for psa with labs, asympt,  to f/u any worsening symptoms or concerns

## 2021-07-21 NOTE — Assessment & Plan Note (Signed)
.   Lab Results  Component Value Date   HGBA1C 6.1 07/21/2021   Stable, pt to continue current medical treatment  - diet

## 2021-07-21 NOTE — Assessment & Plan Note (Signed)
Lab Results  Component Value Date   LDLCALC 56 07/21/2021   Stable, pt to continue current statin zocor 80

## 2021-07-21 NOTE — Assessment & Plan Note (Addendum)
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

## 2021-07-21 NOTE — Progress Notes (Signed)
Chief Complaint:: wellness exam and Leg Swelling (Both legs and ankles are swollen. Pt states he has been fatigue and has a cough.)  , low K, elevated BP, chronic back pain, prostate ca, hyperglycemia       HPI:  Albert Wade is a 75 y.o. male here for wellness exam; declines covid vax, shingrix and tdap; o/w up to date with preventive referrals and immunizations.                        Also Pt continues to have recurring LBP without change in severity, bowel or bladder change, fever, wt loss,  worsening LE pain/numbness/weakness, gait change or falls.  Has worsening leg swelling to both legs on prn lasix only in the last 2 mo, and has cards appt for 2 mo earliest wife could get.  Has taken K in past and needs restart and f/u as well. Does c/o ongoing fatigue, but denies signficant daytime hypersomnolence.  Asks for psa f/u with hx of prostate ca - Denies urinary symptoms such as dysuria, frequency, urgency, flank pain, hematuria or n/v, fever, chills.     Wt Readings from Last 3 Encounters:  07/21/21 213 lb (96.6 kg)  06/09/21 207 lb (93.9 kg)  04/08/21 199 lb 6.4 oz (90.4 kg)   BP Readings from Last 3 Encounters:  07/21/21 (!) 148/82  06/09/21 (!) 157/90  04/08/21 (!) 147/68   Immunization History  Administered Date(s) Administered   Influenza, High Dose Seasonal PF 09/18/2018, 09/05/2019   Influenza, Seasonal, Injecte, Preservative Fre 08/26/2013   Influenza,inj,Quad PF,6+ Mos 09/21/2016   Influenza,inj,quad, With Preservative 10/23/2017   Influenza-Unspecified 10/12/2014, 09/18/2018, 09/17/2020, 09/17/2020   PFIZER(Purple Top)SARS-COV-2 Vaccination 02/16/2020, 03/10/2020   Pneumococcal Conjugate-13 12/20/2013   Pneumococcal Polysaccharide-23 05/12/2015   Td 12/26/2008   There are no preventive care reminders to display for this patient.     Past Medical History:  Diagnosis Date   Acute bronchitis 01/01/2015   Acute upper respiratory infection 05/19/2016   Anginal  pain (HCC)    Anxiety    Arthritis    back,wrists,hx. 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   Confusion 04/27/2017   COPD (chronic obstructive pulmonary disease) ([place])    Depression    Encounter for preventative adult health care exam with abnormal findings 12/20/2013   Fever 04/27/2017   GERD (gastroesophageal reflux disease)    Gynecomastia 05/12/2015   H/O hiatal hernia    pts wife states pt does not have    Heart murmur    History of kidney stones    Hypercholesteremia    Hyperglycemia 05/19/2016   HYPERLIPIDEMIA 04/29/2008   Qualifier: Diagnosis of  By: Jim Like     Impaired glucose tolerance 12/20/2013   Insomnia 04/27/2017   Lumbosacral radiculopathy 10/14/2016   MI (myocardial infarction) [facility])    mid-'90's   MYOCARDIAL INFARCTION 04/29/2008   Qualifier: History of  By: Jim Like     OSA (obstructive sleep apnea) 05/13/2011   Split night study 2011:  AHI 21/hr with obstructive and central events.  Placed on cpap and titrated to 13cm with reasonable control, but attempts to increase pressure increased the number of central events.  Medium resmed quattro full face mask used.     Peripheral neuropathy    peripheral neuropathy   Phimosis/adherent prepuce 10/21/2013   Pneumonia    Prostate cancer ([place])    Shortness of breath 11/05/2010   Qualifier: Diagnosis of  By: Marilynne Halsted, RN, BSN, Jacquelyn     Sleep apnea    mild-no cpap, unable to tolerate   Squamous cell carcinoma of skin 10/29/2020   right mid volar forearm   Umbilical hernia    Umbilical hernia without obstruction and without gangrene 05/19/2016   URI (upper respiratory infection) 04/27/2017    UTI (urinary tract infection) 12/01/2016   Varicose veins    Varicose veins of bilateral lower extremities with other complications [id number]   Wheezing 01/01/2015   Past Surgical History:  Procedure Laterality Date   ANGIOPLASTY     APPENDECTOMY     BACK SURGERY     x3   CARDIAC CATHETERIZATION     11'11   CIRCUMCISION N/A 10/21/2013   Procedure: CIRCUMCISION ADULT;  Surgeon: Bernestine Amass, MD;  Location: [facility] ORS;  Service: Urology;  Laterality: N/A;   COLONOSCOPY WITH PROPOFOL N/A 06/09/2021   Procedure: COLONOSCOPY WITH PROPOFOL;  Surgeon: Jonathon Bellows, MD;  Location: [facility] ENDOSCOPY;  Service: Gastroenterology;  Laterality: N/A;   CYSTOSCOPY N/A 10/21/2013   Procedure: CYSTOSCOPY FLEXIBLE;  Surgeon: Bernestine Amass, MD;  Location: [facility];  Service: Urology;  Laterality: N/A;   CYSTOSCOPY W/ URETERAL STENT PLACEMENT Right 06/01/2020   Procedure: CYSTOSCOPY WITH RETROGRADE PYELOGRAM/URETERAL STENT Exchange;  Surgeon: Hollice Espy, MD;  Location: [facility] ORS;  Service: Urology;  Laterality: Right;   CYSTOSCOPY WITH BIOPSY Right 01/06/2020   Procedure: CYSTOSCOPY WITH BIOPSY;  Surgeon: Hollice Espy, MD;  Location: [facility] ORS;  Service: Urology;  Laterality: Right;   CYSTOSCOPY/URETEROSCOPY/HOLMIUM LASER/STENT PLACEMENT Right 01/06/2020   Procedure: CYSTOSCOPY/URETEROSCOPY/HOLMIUM LASER/STENT PLACEMENT;  Surgeon: Hollice Espy, MD;  Location: [facility] ORS;  Service: Urology;  Laterality: Right;   ESOPHAGOGASTRODUODENOSCOPY N/A 02/03/2021   Procedure: ESOPHAGOGASTRODUODENOSCOPY (EGD);  Surgeon: Jonathon Bellows, MD;  Location: [facility] ENDOSCOPY;  Service: Gastroenterology;  Laterality: N/A;   ESOPHAGOGASTRODUODENOSCOPY (EGD) WITH PROPOFOL N/A 03/09/2021   Procedure: ESOPHAGOGASTRODUODENOSCOPY (EGD) WITH PROPOFOL;  Surgeon: Jonathon Bellows, MD;  Location: [facility] ENDOSCOPY;  Service: Gastroenterology;  Laterality: N/A;   HERNIA REPAIR     INSERTION OF MESH N/A 06/17/2016   Procedure: INSERTION OF MESH;  Surgeon:  Excell Seltzer, MD;  Location: [facility];  Service: General;  Laterality: N/A;   UMBILICAL HERNIA REPAIR N/A 06/17/2016   Procedure: REPAIR UMBILICAL HERNIA WITH MESH;  Surgeon: Excell Seltzer, MD;  Location: [facility];  Service: General;  Laterality: N/A;   WRIST SURGERY Right     reports that he quit smoking about 25 years ago. His smoking use included cigarettes. He has a 20.00 pack-year smoking history. He has never used smokeless tobacco. He reports that he does not drink alcohol and does not use drugs. family history includes Emphysema in his father; Hyperlipidemia in his father and mother; Prostate cancer in his father. Allergies  Allergen Reactions   Cyclobenzaprine     agitation    Seroquel [Quetiapine] Hives   Current Outpatient Medications on File Prior to Visit  Medication Sig Dispense Refill   albuterol (VENTOLIN HFA) 108 (90 Base) MCG/ACT inhaler INHALE 2 PUFFS INTO THE LUNGS 4 TIMES DAILY AS NEEDED 8.5 g  2   ALPRAZolam (XANAX) 0.5 MG tablet Take 0.5 mg by mouth 2 (two) times daily as needed for anxiety.     aspirin 81 MG tablet Take 81 mg by mouth daily.     DULoxetine (CYMBALTA) 60 MG capsule Take 60 mg by mouth 2 (two) times daily.      meloxicam (MOBIC) 15 MG tablet Take 15 mg by mouth daily.     mirtazapine (REMERON) 15 MG tablet Take 15 mg by mouth at bedtime.     omeprazole (PRILOSEC) 20 MG capsule Take 1 capsule (20 mg total) by mouth 2 (two) times daily. 180 capsule 3   oxyCODONE-acetaminophen (PERCOCET) 10-325 MG tablet Take 1 tablet by mouth every 4 (four) hours as needed for pain. (Patient taking differently: Take 1-2 tablets by mouth every 4 (four) hours as needed for pain. Max 8 per day) 30 tablet 0   simvastatin (ZOCOR) 80 MG tablet Take 1 tablet (80 mg total) by mouth every evening. 90 tablet 1   traZODone (DESYREL) 50 MG tablet TAKE ONE-HALF TO ONE TABLET BY MOUTH AT BEDTIME AS NEEDED FOR SLEEP 90 tablet 1   No current facility-administered medications on file  prior to visit.        ROS:  All others reviewed and negative.  Objective        PE:  BP (!) 148/82   Pulse 69   Temp 98 F (36.7 C) (Oral)   Ht '5\' 10"'$  (1.778 m)   Wt 213 lb (96.6 kg)   SpO2 95%   BMI 30.56 kg/m                 Constitutional: Pt appears in NAD               HENT: Head: NCAT.                Right Ear: External ear normal.                 Left Ear: External ear normal.                Eyes: . Pupils are equal, round, and reactive to light. Conjunctivae and EOM are normal               Nose: without d/c or deformity               Neck: Neck supple. Gross normal ROM               Cardiovascular: Normal rate and regular rhythm.                 Pulmonary/Chest: Effort normal and breath sounds without rales or wheezing.                Abd:  Soft, NT, ND, + BS, no organomegaly               Neurological: Pt is alert. At baseline orientation, motor grossly intact               Skin: Skin is warm. No rashes, no other new lesions, LE edema - 1-2+ bilateral to knees bilat               Psychiatric: Pt behavior is normal without agitation   Micro: none  Cardiac tracings I have personally interpreted today:  none  Pertinent Radiological findings (summarize): none   Lab Results  Component Value Date   WBC 6.4 07/21/2021  HGB 12.3 (L) 07/21/2021   HCT 35.6 (L) 07/21/2021   PLT 178.0 07/21/2021   GLUCOSE 159 (H) 02/03/2021   CHOL 121 07/21/2021   TRIG 120.0 07/21/2021   HDL 41.30 07/21/2021   LDLDIRECT 78.0 05/19/2016   LDLCALC 56 07/21/2021   ALT 14 07/21/2021   AST 20 07/21/2021   NA 143 02/03/2021   K 3.9 02/03/2021   CL 103 02/03/2021   CREATININE 0.92 02/03/2021   BUN 13 02/03/2021   CO2 27 02/03/2021   TSH 1.34 07/21/2021   PSA 0.01 (L) 07/21/2021   HGBA1C 6.1 07/21/2021   Assessment/Plan:  GE SLASKI is a 75 y.o. White or Caucasian [1] male with  has a past medical history of Acute bronchitis (01/01/2015), Acute upper respiratory infection  (05/19/2016), Anginal pain ([place]), Anxiety, Arthritis, BPH (benign prostatic hypertrophy), BRONCHITIS, CHRONIC (04/29/2008), CAD (10/16/2009), CAD (coronary artery disease), Chronic back pain, Chronic bronchitis, Chronic low back pain with sciatica (10/14/2016), Confusion (04/27/2017), COPD (chronic obstructive pulmonary disease) ([place]), Depression, Encounter for preventative adult health care exam with abnormal findings (12/20/2013), Fever (04/27/2017), GERD (gastroesophageal reflux disease), Gynecomastia (05/12/2015), H/O hiatal hernia, Heart murmur, History of kidney stones, Hypercholesteremia, Hyperglycemia (05/19/2016), HYPERLIPIDEMIA (04/29/2008), Impaired glucose tolerance (12/20/2013), Insomnia (04/27/2017), Lumbosacral radiculopathy (10/14/2016), MI (myocardial infarction) ([place]), MYOCARDIAL INFARCTION (04/29/2008), OSA (obstructive sleep apnea) (05/13/2011), Peripheral neuropathy, Phimosis/adherent prepuce (10/21/2013), Pneumonia, Prostate cancer ([place]), Shortness of breath (11/05/2010), Sleep apnea, Squamous cell carcinoma of skin ([id number]), Umbilical hernia, Umbilical hernia without obstruction and without gangrene (05/19/2016), URI (upper respiratory infection) (04/27/2017), UTI (urinary tract infection) (12/01/2016), Varicose veins, Varicose veins of bilateral lower extremities with other complications ([id number]), and Wheezing (01/01/2015).  Chronic back pain Needs surgury per pt which he is putting off,  to f/u any worsening symptoms or concerns  Encounter for preventative adult health care exam with abnormal findings Age and sex appropriate education and counseling updated with regular exercise and diet Referrals for preventative services - none needed Immunizations addressed - declines covid vax, shingrix, tdap Smoking counseling  - none needed Evidence for depression or other mood disorder - none significant Most recent labs reviewed. 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 Lab Results  Component Value Date   HGBA1C 6.1 07/21/2021   Stable, pt to continue current medical treatment  - diet   HLD (hyperlipidemia) Lab Results  Component Value Date   LDLCALC 56 07/21/2021   Stable, pt to continue current statin zocor 80  Followup: Return in about 3 months (around 10/21/2021).  Cathlean Cower, MD 07/21/2021 10:54 PM [facility]

## 2021-07-21 NOTE — Patient Instructions (Signed)
Ok to increase the lasix to 40 mg twice per day  Please take all new medication as prescribed - the potassium pill once per day  Please continue all other medications as before, and refills have been done if requested.  Please have the pharmacy call with any other refills you may need.  Please continue your efforts at being more active, low cholesterol diet, and weight control.  You are otherwise up to date with prevention measures today.  Please keep your appointments with your specialists as you may have planned - cardiology Dr Theressa Stamps will be contacted regarding the referral for: echocardiogram  Please go to the LAB at the blood drawing area for the tests to be done - HERE today  Please go to the LAB at the blood drawing area for the tests to be done - at the [place] in 2 weeks for the potassium check  You will be contacted by phone if any changes need to be made immediately.  Otherwise, you will receive a letter about your results with an explanation, but please check with MyChart first.  Please remember to sign up for MyChart if you have not done so, as this will be important to you in the future with finding out test results, communicating by private email, and scheduling acute appointments online when needed.  Please make an Appointment to return in 3 months,or sooner if needed

## 2021-07-21 NOTE — Assessment & Plan Note (Signed)
Needs surgury per pt which he is putting off,  to f/u any worsening symptoms or concerns

## 2021-07-21 NOTE — Assessment & Plan Note (Signed)
Age and sex appropriate education and counseling updated with regular exercise and diet Referrals for preventative services - none needed Immunizations addressed - declines covid vax, shingrix, tdap Smoking counseling  - none needed Evidence for depression or other mood disorder - none significant Most recent labs reviewed. I have personally reviewed and have noted: 1) the patient's medical and social history 2) The patient's current medications and supplements 3) The patient's height, weight, and BMI have been recorded in the chart

## 2021-08-02 ENCOUNTER — Telehealth: Payer: Self-pay | Admitting: Internal Medicine

## 2021-08-02 ENCOUNTER — Other Ambulatory Visit: Payer: Self-pay | Admitting: Internal Medicine

## 2021-08-02 NOTE — Telephone Encounter (Signed)
Patient requesting to have labs done at [facility]. He states Dr Jenny Reichmann wanted him to have repeat labs in 2 weeks

## 2021-08-02 NOTE — Telephone Encounter (Signed)
Sorry, I am not able to order at that lab

## 2021-08-03 NOTE — Telephone Encounter (Signed)
Pt notified of dr Jenny Reichmann last note

## 2021-08-10 ENCOUNTER — Ambulatory Visit (HOSPITAL_COMMUNITY): Payer: Medicare Other | Attending: Cardiovascular Disease

## 2021-08-10 ENCOUNTER — Other Ambulatory Visit: Payer: Self-pay

## 2021-08-10 DIAGNOSIS — I5021 Acute systolic (congestive) heart failure: Secondary | ICD-10-CM | POA: Diagnosis present

## 2021-08-10 LAB — ECHOCARDIOGRAM COMPLETE
Area-P 1/2: 2.93 cm2
S' Lateral: 3.1 cm

## 2021-08-15 ENCOUNTER — Encounter: Payer: Self-pay | Admitting: Internal Medicine

## 2021-08-16 DIAGNOSIS — M47812 Spondylosis without myelopathy or radiculopathy, cervical region: Secondary | ICD-10-CM | POA: Diagnosis not present

## 2021-08-16 DIAGNOSIS — Z79899 Other long term (current) drug therapy: Secondary | ICD-10-CM | POA: Diagnosis not present

## 2021-08-16 DIAGNOSIS — M542 Cervicalgia: Secondary | ICD-10-CM | POA: Diagnosis not present

## 2021-08-16 DIAGNOSIS — G894 Chronic pain syndrome: Secondary | ICD-10-CM | POA: Diagnosis not present

## 2021-08-16 DIAGNOSIS — K5909 Other constipation: Secondary | ICD-10-CM | POA: Diagnosis not present

## 2021-08-16 DIAGNOSIS — M25579 Pain in unspecified ankle and joints of unspecified foot: Secondary | ICD-10-CM | POA: Diagnosis not present

## 2021-08-16 DIAGNOSIS — M48061 Spinal stenosis, lumbar region without neurogenic claudication: Secondary | ICD-10-CM | POA: Diagnosis not present

## 2021-09-16 ENCOUNTER — Other Ambulatory Visit: Payer: Self-pay

## 2021-09-16 ENCOUNTER — Ambulatory Visit: Payer: Medicare Other | Admitting: Internal Medicine

## 2021-09-16 ENCOUNTER — Encounter: Payer: Self-pay | Admitting: Internal Medicine

## 2021-09-16 ENCOUNTER — Telehealth: Payer: Self-pay | Admitting: Internal Medicine

## 2021-09-16 VITALS — BP 160/80 | HR 71 | Ht 70.5 in | Wt 209.8 lb

## 2021-09-16 DIAGNOSIS — I10 Essential (primary) hypertension: Secondary | ICD-10-CM

## 2021-09-16 DIAGNOSIS — I5032 Chronic diastolic (congestive) heart failure: Secondary | ICD-10-CM | POA: Diagnosis not present

## 2021-09-16 DIAGNOSIS — R609 Edema, unspecified: Secondary | ICD-10-CM

## 2021-09-16 DIAGNOSIS — I5022 Chronic systolic (congestive) heart failure: Secondary | ICD-10-CM | POA: Diagnosis not present

## 2021-09-16 DIAGNOSIS — Z7185 Encounter for immunization safety counseling: Secondary | ICD-10-CM | POA: Diagnosis not present

## 2021-09-16 DIAGNOSIS — E876 Hypokalemia: Secondary | ICD-10-CM | POA: Diagnosis not present

## 2021-09-16 MED ORDER — AMLODIPINE BESYLATE 2.5 MG PO TABS: 2.5000 mg | ORAL_TABLET | Freq: Every day | ORAL | 3 refills | Status: DC

## 2021-09-16 MED ORDER — FLUTICASONE PROPIONATE HFA 44 MCG/ACT IN AERO: 1.0000 | INHALATION_SPRAY | Freq: Two times a day (BID) | RESPIRATORY_TRACT | 6 refills | Status: DC | PRN

## 2021-09-16 NOTE — Telephone Encounter (Signed)
Please contact patient   Would reocmm he cut back on simvistatin 40 mg

## 2021-09-16 NOTE — Progress Notes (Signed)
Cardiology Office Note   Date:  09/16/2021   ID:  Albert, Wade January 06, 1946, [name] [id number]  PCP:  Biagio Borg, MD  Cardiologist:   Dorris Carnes, MD    F/U of mild CAD and HTN   History of Present Illness: Albert Wade is a 75 y.o. male with a history of mild CAD by cath in 2011, COPD ,HL and HTN  The pt was last in cardiology clinic Feb 2021  The pt denies CP   Breathing was OK until the past few wks   Has had some wheezing     No palpitations     Being evaluated for back surgery      Outpatient Medications Prior to Visit  Medication Sig Dispense Refill   albuterol (VENTOLIN HFA) 108 (90 Base) MCG/ACT inhaler INHALE 2 PUFFS INTO THE LUNGS 4 TIMES DAILY AS NEEDED 8.5 g 2   ALPRAZolam (XANAX) 0.5 MG tablet Take 0.5 mg by mouth 2 (two) times daily as needed for anxiety.     aspirin 81 MG tablet Take 81 mg by mouth daily.     DULoxetine (CYMBALTA) 60 MG capsule Take 60 mg by mouth 2 (two) times daily.      furosemide (LASIX) 40 MG tablet Take 1 tablet (40 mg total) by mouth 2 (two) times daily. 180 tablet 3   mirtazapine (REMERON) 15 MG tablet Take 15 mg by mouth at bedtime.     omeprazole (PRILOSEC) 20 MG capsule Take 1 capsule (20 mg total) by mouth 2 (two) times daily. 180 capsule 3   oxyCODONE-acetaminophen (PERCOCET) 10-325 MG tablet Take 1 tablet by mouth every 4 (four) hours as needed for pain. (Patient taking differently: Take 1-2 tablets by mouth every 4 (four) hours as needed for pain. Max 8 per day) 30 tablet 0   potassium chloride (KLOR-CON 10) 10 MEQ tablet Take 1 tablet (10 mEq total) by mouth daily. 90 tablet 3   simvastatin (ZOCOR) 80 MG tablet Take 1 tablet (80 mg total) by mouth every evening. 90 tablet 1   meloxicam (MOBIC) 15 MG tablet Take 15 mg by mouth daily. (Patient not taking: Reported on 09/16/2021)     traZODone (DESYREL) 50 MG tablet TAKE ONE-HALF TO ONE TABLET BY MOUTH AT BEDTIME AS NEEDED FOR SLEEP (Patient not taking: Reported on 09/16/2021) 90  tablet 1   No facility-administered medications prior to visit.     Allergies:   Cyclobenzaprine and Seroquel [quetiapine]   Past Medical History:  Diagnosis Date   Acute bronchitis 01/01/2015   Acute upper respiratory infection 05/19/2016   Anginal pain (HCC)    Anxiety    Arthritis    back,wrists,hx. 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   Confusion 04/27/2017   COPD (chronic obstructive pulmonary disease) ([place])    Depression    Encounter for preventative adult health care exam with abnormal findings 12/20/2013   Fever 04/27/2017   GERD (gastroesophageal reflux disease)    Gynecomastia 05/12/2015   H/O hiatal hernia    pts wife states pt does not have    Heart murmur  History of kidney stones    Hypercholesteremia    Hyperglycemia 05/19/2016   HYPERLIPIDEMIA 04/29/2008   Qualifier: Diagnosis of  By: Jim Like     Impaired glucose tolerance 12/20/2013   Insomnia 04/27/2017   Lumbosacral radiculopathy 10/14/2016   MI (myocardial infarction) [facility])    mid-'90's   MYOCARDIAL INFARCTION 04/29/2008   Qualifier: History of  By: Jim Like     OSA (obstructive sleep apnea) 05/13/2011   Split night study 2011:  AHI 21/hr with obstructive and central events.  Placed on cpap and titrated to 13cm with reasonable control, but attempts to increase pressure increased the number of central events.  Medium resmed quattro full face mask used.     Peripheral neuropathy    peripheral neuropathy   Phimosis/adherent prepuce 10/21/2013   Pneumonia    Prostate cancer ([place])    Shortness of breath 11/05/2010   Qualifier: Diagnosis of  By: Marilynne Halsted, RN, BSN, Jacquelyn     Sleep  apnea    mild-no cpap, unable to tolerate   Squamous cell carcinoma of skin 10/29/2020   right mid volar forearm   Umbilical hernia    Umbilical hernia without obstruction and without gangrene 05/19/2016   URI (upper respiratory infection) 04/27/2017   UTI (urinary tract infection) 12/01/2016   Varicose veins    Varicose veins of bilateral lower extremities with other complications 38/75/6433   Wheezing 01/01/2015    Past Surgical History:  Procedure Laterality Date   ANGIOPLASTY     APPENDECTOMY     BACK SURGERY     x3   CARDIAC CATHETERIZATION     11'11   CIRCUMCISION N/A 10/21/2013   Procedure: CIRCUMCISION ADULT;  Surgeon: Bernestine Amass, MD;  Location: [facility];  Service: Urology;  Laterality: N/A;   COLONOSCOPY WITH PROPOFOL N/A 06/09/2021   Procedure: COLONOSCOPY WITH PROPOFOL;  Surgeon: Jonathon Bellows, MD;  Location: [facility] ENDOSCOPY;  Service: Gastroenterology;  Laterality: N/A;   CYSTOSCOPY N/A 10/21/2013   Procedure: CYSTOSCOPY FLEXIBLE;  Surgeon: Bernestine Amass, MD;  Location: [facility];  Service: Urology;  Laterality: N/A;   CYSTOSCOPY W/ URETERAL STENT PLACEMENT Right 06/01/2020   Procedure: CYSTOSCOPY WITH RETROGRADE PYELOGRAM/URETERAL STENT Exchange;  Surgeon: Hollice Espy, MD;  Location: [facility] ORS;  Service: Urology;  Laterality: Right;   CYSTOSCOPY WITH BIOPSY Right 01/06/2020   Procedure: CYSTOSCOPY WITH BIOPSY;  Surgeon: Hollice Espy, MD;  Location: [facility] ORS;  Service: Urology;  Laterality: Right;   CYSTOSCOPY/URETEROSCOPY/HOLMIUM LASER/STENT PLACEMENT Right 01/06/2020   Procedure: CYSTOSCOPY/URETEROSCOPY/HOLMIUM LASER/STENT PLACEMENT;  Surgeon: Hollice Espy, MD;  Location: [facility] ORS;  Service: Urology;  Laterality: Right;   ESOPHAGOGASTRODUODENOSCOPY N/A 02/03/2021   Procedure: ESOPHAGOGASTRODUODENOSCOPY (EGD);  Surgeon: Jonathon Bellows, MD;  Location: [facility] END[facility]COPY;  Service: Gastroenterology;  Laterality: N/A;   ESOPHAGOGASTRODUODENOSCOPY (EGD) WITH PROPOFOL N/A 03/09/2021    Procedure: ESOPHAGOGASTRODUODENOSCOPY (EGD) WITH PROPOFOL;  Surgeon: Jonathon Bellows, MD;  Location: [facility] ENDOSCOPY;  Service: Gastroenterology;  Laterality: N/A;   HERNIA REPAIR     INSERTION OF MESH N/A 06/17/2016   Procedure: INSERTION OF MESH;  Surgeon: Excell Seltzer, MD;  Location: [facility];  Service: General;  Laterality: N/A;   UMBILICAL HERNIA REPAIR N/A 06/17/2016   Procedure: REPAIR UMBILICAL HERNIA WITH MESH;  Surgeon: Excell Seltzer, MD;  Location: [facility];  Service: General;  Laterality: N/A;   WRIST SURGERY Right      Social History:  The patient  reports that he quit smoking about 25 years ago. His smoking use included cigarettes. He  has a 20.00 pack-year smoking history. He has never used smokeless tobacco. He reports that he does not drink alcohol and does not use drugs.   Family History:  The patient's family history includes Emphysema in his father; Hyperlipidemia in his father and mother; Prostate cancer in his father.    ROS:  Please see the history of present illness. All other systems are reviewed and  Negative to the above problem except as noted.    PHYSICAL EXAM: VS:  BP  160/80  P 71  O2 Sat RA 94%   Wt 210lb GEN:  Obese 75 yo  in no acute distress  HEENT: normal  Neck: no JVD, no carotid bruits, Cardiac: RRR; no murmurs,,no LE edema  Respiratory:  Mild wheezing   Moving air well  GI: soft, nontender, nondistended, + BS  No hepatomegaly  MS: no deformity Moving all extremities   Skin: warm and dry, no rash Neuro:  Strength and sensation are intact Psych: euthymic mood, full affect   EKG:  EKG is ordered today  SR 76 bpm   IWMI   Nonspecific ST changes   Lipid Panel    Component Value Date/Time   CHOL 121 07/21/2021 0948   CHOL 124 02/14/2020 1314   TRIG 120.0 07/21/2021 0948   HDL 41.30 07/21/2021 0948   HDL 46 02/14/2020 1314   CHOLHDL 3 07/21/2021 0948   VLDL 24.0 07/21/2021 0948   LDLCALC 56 07/21/2021 0948   LDLCALC 57 02/14/2020 1314    LDLDIRECT 78.0 05/19/2016 1610      Wt Readings from Last 3 Encounters:  09/16/21 209 lb 12.8 oz (95.2 kg)  07/21/21 213 lb (96.6 kg)  06/09/21 207 lb (93.9 kg)      ASSESSMENT AND PLAN:  1   CAD   Mild by cath in 2011  Myovue in march 2017 showed no significant ischemia   LVEF normal PT without angina From a cardiac standpoint I think that pt is at relatively low and acceptable risk for surgery  OK to proceed without further testing .   2.  HTN   BP is elevated   I would add amlodipine 2.5 mg daily   Follow up BP later this winter  3  HL   LDL 56   HDL 41   Continue simvistatin   Will cut back dose to 40 mg     4  Pulmonary   Some wheezing on exam   Will add steroid inhaler   Will chec BMET and BNP today       Signed, Dorris Carnes, MD  09/16/2021 4:07 PM    [facility] [address], [place], [place]  [address] Phone: [phone]; Fax: [phone]

## 2021-09-16 NOTE — Patient Instructions (Signed)
Medication Instructions:  Your physician has recommended you make the following change in your medication: Start amlodipine 2.5 mg by mouth daily  *If you need a refill on your cardiac medications before your next appointment, please call your pharmacy*   Lab Work: Lab work to be done today--BMP and BNP If you have labs (blood work) drawn today and your tests are completely normal, you will receive your results only by: [place] (if you have MyChart) OR A paper copy in the mail If you have any lab test that is abnormal or we need to change your treatment, we will call you to review the results.   Testing/Procedures: none   Follow-Up: At [facility], you and your health needs are our priority.  As part of our continuing mission to provide you with exceptional heart care, we have created designated Provider Care Teams.  These Care Teams include your primary Cardiologist (physician) and Advanced Practice Providers (APPs -  Physician Assistants and Nurse Practitioners) who all work together to provide you with the care you need, when you need it.  We recommend signing up for the patient portal called "MyChart".  Sign up information is provided on this After Visit Summary.  MyChart is used to connect with patients for Virtual Visits (Telemedicine).  Patients are able to view lab/test results, encounter notes, upcoming appointments, etc.  Non-urgent messages can be sent to your provider as well.   To learn more about what you can do with MyChart, go to [email or web address].    Your next appointment:   We will call you to schedule follow up   The format for your next appointment:   In Person  Provider:   You may see Dorris Carnes, MD or one of the following Advanced Practice Providers on your designated Care Team:   Richardson Dopp, PA-C Robbie Lis, [place]   Other Instructions

## 2021-09-17 ENCOUNTER — Telehealth: Payer: Self-pay | Admitting: Internal Medicine

## 2021-09-17 LAB — BASIC METABOLIC PANEL WITH GFR
BUN/Creatinine Ratio: 16 (ref 10–24)
BUN: 14 mg/dL (ref 8–27)
CO2: 27 mmol/L (ref 20–29)
Calcium: 8.8 mg/dL (ref 8.6–10.2)
Chloride: 100 mmol/L (ref 96–106)
Creatinine, Ser: 0.9 mg/dL (ref 0.76–1.27)
Glucose: 127 mg/dL — ABNORMAL HIGH (ref 65–99)
Potassium: 3.3 mmol/L — ABNORMAL LOW (ref 3.5–5.2)
Sodium: 143 mmol/L (ref 134–144)
eGFR: 89 mL/min/{1.73_m2}

## 2021-09-17 LAB — PRO B NATRIURETIC PEPTIDE: NT-Pro BNP: 110 pg/mL (ref 0–486)

## 2021-09-17 MED ORDER — FLUTICASONE PROPIONATE HFA 44 MCG/ACT IN AERO: 2.0000 | INHALATION_SPRAY | Freq: Two times a day (BID) | RESPIRATORY_TRACT | 6 refills | Status: DC | PRN

## 2021-09-17 NOTE — Telephone Encounter (Signed)
Medication ordered for 1 puff instead of 2 so when the patient would pick up 1 inhaler his insurance would cost two co pays to pick up one unit. Okay to change dose to 2 puffs, 2 times daily. PRN. Pharmacy took verbal order.

## 2021-09-17 NOTE — Telephone Encounter (Signed)
Pt c/o medication issue:  1. Name of Medication: fluticasone (FLOVENT HFA) 44 MCG/ACT inhaler  2. How are you currently taking this medication (dosage and times per day)?   3. Are you having a reaction (difficulty breathing--STAT)?   4. What is your medication issue?   [name] with [facility] is requesting to clarify instructions on the patient's medication.

## 2021-09-20 MED ORDER — SIMVASTATIN 40 MG PO TABS: 40.0000 mg | ORAL_TABLET | Freq: Every day | ORAL | 3 refills | Status: DC

## 2021-09-20 MED ORDER — POTASSIUM CHLORIDE ER 10 MEQ PO TBCR: 20.0000 meq | EXTENDED_RELEASE_TABLET | Freq: Every day | ORAL | 3 refills | Status: DC

## 2021-09-20 NOTE — Telephone Encounter (Signed)
Fay Records, MD  09/18/2021  8:28 PM EDT     Potassium is a little low    Has been normal  on previous checks  He takes  potassium daily I would increase to 20 mEq daily         Patient has been notified of results and recommendations.  He voices understanding.  Will decrease simvastatin to 40 mg daily and increase potassium to 20 meq daily.   Labs forwarded to Dr. Jenny Reichmann per pt request.  Pt and his wife also asked about recent echo which was ordered and reviewed by Dr. Jenny Reichmann.  I reviewed that result with them as well.

## 2021-09-20 NOTE — Addendum Note (Signed)
Addended by: Rodman Key on: 09/20/2021 09:11 AM   Modules accepted: Orders

## 2021-10-13 ENCOUNTER — Other Ambulatory Visit: Payer: Self-pay

## 2021-10-13 ENCOUNTER — Other Ambulatory Visit: Payer: Medicare Other

## 2021-10-13 ENCOUNTER — Telehealth: Payer: Self-pay | Admitting: Internal Medicine

## 2021-10-13 ENCOUNTER — Telehealth (INDEPENDENT_AMBULATORY_CARE_PROVIDER_SITE_OTHER): Payer: Medicare Other | Admitting: Nurse Practitioner

## 2021-10-13 ENCOUNTER — Ambulatory Visit (INDEPENDENT_AMBULATORY_CARE_PROVIDER_SITE_OTHER): Payer: Medicare Other

## 2021-10-13 VITALS — BP 196/86 | HR 74 | Temp 98.4°F

## 2021-10-13 DIAGNOSIS — R52 Pain, unspecified: Secondary | ICD-10-CM

## 2021-10-13 DIAGNOSIS — R509 Fever, unspecified: Secondary | ICD-10-CM

## 2021-10-13 LAB — POCT INFLUENZA A/B
Influenza A, POC: NEGATIVE
Influenza B, POC: NEGATIVE

## 2021-10-13 NOTE — Progress Notes (Addendum)
Patient ID: Albert Wade, male    DOB: 04-23-1946, 75 y.o.   [name]: [id number]  Virtual visit completed through [employer], a video enabled telemedicine application. Due to national recommendations of social distancing due to COVID-19, a virtual visit is felt to be most appropriate for this patient at this time. Reviewed limitations, risks, security and privacy concerns of performing a virtual visit and the availability of in person appointments. I also reviewed that there may be a patient responsible charge related to this service. The patient agreed to proceed.   Patient location: home Provider location: [place] at [facility], office Persons participating in this virtual visit: patient, provider spose  If any vitals were documented, they were collected by patient at home unless specified below.    BP (!) 196/86 Comment: per patient-used wrist b/p cuff  Pulse 74   Temp 98.4 F (36.9 C)    CC: Fever and body aches Subjective:   HPI: Albert Wade is a 75 y.o. male presenting on 10/13/2021 for Fever (X 4 days-10/09/21, body aches, fever went up to 103 at the highest, headache, nodule below right ear, runny nose. No sore throat, no cough, no ear pain. Covid test negative on 10/11/21)    Symptoms started about 4 days At home test. Monday tested and negative. Retested today at home and was negative Pfizer 2 and 2 boosters. Also had flu shot this season  Has not taken tylenol. Has taken his percocet that has tylenol that helps some with the fever. No fever currently No sick contacts    Relevant past medical, surgical, family and social history reviewed and updated as indicated. Interim medical history since our last visit reviewed. Allergies and medications reviewed and updated. Outpatient Medications Prior to Visit  Medication Sig Dispense Refill   albuterol (VENTOLIN HFA) 108 (90 Base) MCG/ACT inhaler INHALE 2 PUFFS INTO THE LUNGS 4 TIMES DAILY AS NEEDED 8.5 g 2   ALPRAZolam  (XANAX) 0.5 MG tablet Take 0.5 mg by mouth 2 (two) times daily as needed for anxiety.     amLODipine (NORVASC) 2.5 MG tablet Take 1 tablet (2.5 mg total) by mouth daily. 90 tablet 3   aspirin 81 MG tablet Take 81 mg by mouth daily.     DULoxetine (CYMBALTA) 60 MG capsule Take 60 mg by mouth 2 (two) times daily.      eszopiclone 3 MG TABS Take 3 mg by mouth at bedtime. Take immediately before bedtime     fluticasone (FLOVENT HFA) 44 MCG/ACT inhaler Inhale 2 puffs into the lungs 2 (two) times daily as needed. 1 each 6   furosemide (LASIX) 40 MG tablet Take 1 tablet (40 mg total) by mouth 2 (two) times daily. 180 tablet 3   omeprazole (PRILOSEC) 20 MG capsule Take 1 capsule (20 mg total) by mouth 2 (two) times daily. 180 capsule 3   oxyCODONE-acetaminophen (PERCOCET) 10-325 MG tablet Take 1 tablet by mouth every 4 (four) hours as needed for pain. (Patient taking differently: Take 1-2 tablets by mouth every 4 (four) hours as needed for pain. Max 8 per day) 30 tablet 0   potassium chloride (KLOR-CON) 10 MEQ tablet Take 2 tablets (20 mEq total) by mouth daily. 180 tablet 3   simvastatin (ZOCOR) 40 MG tablet Take 1 tablet (40 mg total) by mouth at bedtime. 90 tablet 3   meloxicam (MOBIC) 15 MG tablet Take 15 mg by mouth daily. (Patient not taking: Reported on 09/16/2021)     mirtazapine (  REMERON) 15 MG tablet Take 15 mg by mouth at bedtime.     traZODone (DESYREL) 50 MG tablet TAKE ONE-HALF TO ONE TABLET BY MOUTH AT BEDTIME AS NEEDED FOR SLEEP (Patient not taking: Reported on 09/16/2021) 90 tablet 1   No facility-administered medications prior to visit.     Per HPI unless specifically indicated in ROS section below Review of Systems  Constitutional:  Positive for chills, fatigue and fever.  HENT:  Positive for sinus pressure. Negative for congestion, ear discharge, ear pain and sore throat.   Respiratory:  Negative for cough and shortness of breath.   Cardiovascular:  Negative for chest pain.   Gastrointestinal:  Negative for diarrhea, nausea and vomiting.  Musculoskeletal:  Positive for arthralgias and myalgias.  Neurological:  Positive for headaches.  Objective:  BP (!) 196/86 Comment: per patient-used wrist b/p cuff  Pulse 74   Temp 98.4 F (36.9 C)   Wt Readings from Last 3 Encounters:  09/16/21 209 lb 12.8 oz (95.2 kg)  07/21/21 213 lb (96.6 kg)  06/09/21 207 lb (93.9 kg)      Physical exam: Gen: alert, NAD, not ill appearing Pulm: speaks in complete sentences without increased work of breathing Psych: normal mood, normal thought content      Results for orders placed or performed in visit on 62/70/35  Basic Metabolic Panel (BMET)  Result Value Ref Range   Glucose 127 (H) 65 - 99 mg/dL   BUN 14 8 - 27 mg/dL   Creatinine, Ser 0.90 0.76 - 1.27 mg/dL   eGFR 89 >59 mL/min/1.73   BUN/Creatinine Ratio 16 10 - 24   Sodium 143 134 - 144 mmol/L   Potassium 3.3 (L) 3.5 - 5.2 mmol/L   Chloride 100 96 - 106 mmol/L   CO2 27 20 - 29 mmol/L   Calcium 8.8 8.6 - 10.2 mg/dL  Pro b natriuretic peptide  Result Value Ref Range   NT-Pro BNP 110 0 - 486 pg/mL   Assessment & Plan:   Problem List Items Addressed This Visit       Other   Fever - Primary    Patient states has been having a fever.  Has taken 2 COVID tests 1 today and Monday with a negative.  We will have patient come in for flu testing.  Pending lab results.  May continue over-the-counter treatments as needed for symptom relief      Relevant Orders   Influenza A/B   Body aches    Pending flu test.      Relevant Orders   Influenza A/B    Flu test negative.   No orders of the defined types were placed in this encounter.  No orders of the defined types were placed in this encounter.   I discussed the assessment and treatment plan with the patient. The patient was provided an opportunity to ask questions and all were answered. The patient agreed with the plan and demonstrated an understanding of the  instructions. The patient was advised to call back or seek an in-person evaluation if the symptoms worsen or if the condition fails to improve as anticipated.  Follow up plan: No follow-ups on file.  Romilda Garret, NP

## 2021-10-13 NOTE — Assessment & Plan Note (Signed)
Patient states has been having a fever.  Has taken 2 COVID tests 1 today and Monday with a negative.  We will have patient come in for flu testing.  Pending lab results.  May continue over-the-counter treatments as needed for symptom relief

## 2021-10-13 NOTE — Telephone Encounter (Signed)
Please advise recommendations and result per epic says negative

## 2021-10-13 NOTE — Telephone Encounter (Addendum)
Pt wife called in wanting to know the results of a flu test her husband had. Pt saw Karl Ito today virtually. Pt wife want to be called

## 2021-10-13 NOTE — Assessment & Plan Note (Signed)
Pending flu test.

## 2021-10-14 ENCOUNTER — Observation Stay: Payer: Medicare Other

## 2021-10-14 ENCOUNTER — Other Ambulatory Visit: Payer: Self-pay

## 2021-10-14 ENCOUNTER — Emergency Department: Payer: Medicare Other

## 2021-10-14 ENCOUNTER — Inpatient Hospital Stay
Admission: EM | Admit: 2021-10-14 | Discharge: 2021-10-16 | DRG: 853 | Disposition: A | Discharge status: Home / Self Care | Payer: Medicare Other | Attending: Internal Medicine | Admitting: Internal Medicine

## 2021-10-14 DIAGNOSIS — G8929 Other chronic pain: Secondary | ICD-10-CM | POA: Diagnosis present

## 2021-10-14 DIAGNOSIS — G4733 Obstructive sleep apnea (adult) (pediatric): Secondary | ICD-10-CM | POA: Diagnosis not present

## 2021-10-14 DIAGNOSIS — I252 Old myocardial infarction: Secondary | ICD-10-CM

## 2021-10-14 DIAGNOSIS — R7989 Other specified abnormal findings of blood chemistry: Secondary | ICD-10-CM | POA: Diagnosis not present

## 2021-10-14 DIAGNOSIS — R509 Fever, unspecified: Principal | ICD-10-CM | POA: Diagnosis present

## 2021-10-14 DIAGNOSIS — I10 Essential (primary) hypertension: Secondary | ICD-10-CM | POA: Diagnosis not present

## 2021-10-14 DIAGNOSIS — J449 Chronic obstructive pulmonary disease, unspecified: Secondary | ICD-10-CM | POA: Diagnosis not present

## 2021-10-14 DIAGNOSIS — G9341 Metabolic encephalopathy: Secondary | ICD-10-CM | POA: Diagnosis not present

## 2021-10-14 DIAGNOSIS — A419 Sepsis, unspecified organism: Secondary | ICD-10-CM | POA: Diagnosis not present

## 2021-10-14 DIAGNOSIS — I700 Atherosclerosis of aorta: Secondary | ICD-10-CM | POA: Diagnosis not present

## 2021-10-14 DIAGNOSIS — N400 Enlarged prostate without lower urinary tract symptoms: Secondary | ICD-10-CM | POA: Diagnosis present

## 2021-10-14 DIAGNOSIS — Z20822 Contact with and (suspected) exposure to covid-19: Secondary | ICD-10-CM | POA: Diagnosis not present

## 2021-10-14 DIAGNOSIS — N1330 Unspecified hydronephrosis: Secondary | ICD-10-CM | POA: Diagnosis not present

## 2021-10-14 DIAGNOSIS — E785 Hyperlipidemia, unspecified: Secondary | ICD-10-CM | POA: Diagnosis present

## 2021-10-14 DIAGNOSIS — I8393 Asymptomatic varicose veins of bilateral lower extremities: Secondary | ICD-10-CM | POA: Diagnosis present

## 2021-10-14 DIAGNOSIS — I110 Hypertensive heart disease with heart failure: Secondary | ICD-10-CM | POA: Diagnosis present

## 2021-10-14 DIAGNOSIS — Z83438 Family history of other disorder of lipoprotein metabolism and other lipidemia: Secondary | ICD-10-CM | POA: Diagnosis not present

## 2021-10-14 DIAGNOSIS — G629 Polyneuropathy, unspecified: Secondary | ICD-10-CM | POA: Diagnosis present

## 2021-10-14 DIAGNOSIS — Z7982 Long term (current) use of aspirin: Secondary | ICD-10-CM

## 2021-10-14 DIAGNOSIS — Z87442 Personal history of urinary calculi: Secondary | ICD-10-CM | POA: Diagnosis not present

## 2021-10-14 DIAGNOSIS — E876 Hypokalemia: Secondary | ICD-10-CM | POA: Diagnosis present

## 2021-10-14 DIAGNOSIS — Z888 Allergy status to other drugs, medicaments and biological substances status: Secondary | ICD-10-CM

## 2021-10-14 DIAGNOSIS — E7800 Pure hypercholesterolemia, unspecified: Secondary | ICD-10-CM | POA: Diagnosis not present

## 2021-10-14 DIAGNOSIS — Z8546 Personal history of malignant neoplasm of prostate: Secondary | ICD-10-CM

## 2021-10-14 DIAGNOSIS — Z85828 Personal history of other malignant neoplasm of skin: Secondary | ICD-10-CM

## 2021-10-14 DIAGNOSIS — K760 Fatty (change of) liver, not elsewhere classified: Secondary | ICD-10-CM | POA: Diagnosis present

## 2021-10-14 DIAGNOSIS — Z87891 Personal history of nicotine dependence: Secondary | ICD-10-CM

## 2021-10-14 DIAGNOSIS — I2510 Atherosclerotic heart disease of native coronary artery without angina pectoris: Secondary | ICD-10-CM | POA: Diagnosis not present

## 2021-10-14 DIAGNOSIS — I5032 Chronic diastolic (congestive) heart failure: Secondary | ICD-10-CM | POA: Diagnosis present

## 2021-10-14 DIAGNOSIS — Z825 Family history of asthma and other chronic lower respiratory diseases: Secondary | ICD-10-CM

## 2021-10-14 DIAGNOSIS — R519 Headache, unspecified: Secondary | ICD-10-CM | POA: Diagnosis not present

## 2021-10-14 DIAGNOSIS — F418 Other specified anxiety disorders: Secondary | ICD-10-CM | POA: Diagnosis not present

## 2021-10-14 DIAGNOSIS — M47812 Spondylosis without myelopathy or radiculopathy, cervical region: Secondary | ICD-10-CM | POA: Diagnosis not present

## 2021-10-14 DIAGNOSIS — D170 Benign lipomatous neoplasm of skin and subcutaneous tissue of head, face and neck: Secondary | ICD-10-CM | POA: Diagnosis not present

## 2021-10-14 DIAGNOSIS — Z8042 Family history of malignant neoplasm of prostate: Secondary | ICD-10-CM

## 2021-10-14 DIAGNOSIS — R7303 Prediabetes: Secondary | ICD-10-CM | POA: Diagnosis present

## 2021-10-14 DIAGNOSIS — K219 Gastro-esophageal reflux disease without esophagitis: Secondary | ICD-10-CM | POA: Diagnosis not present

## 2021-10-14 DIAGNOSIS — Z79899 Other long term (current) drug therapy: Secondary | ICD-10-CM

## 2021-10-14 HISTORY — DX: Essential (primary) hypertension: I10

## 2021-10-14 LAB — URINALYSIS, COMPLETE (UACMP) WITH MICROSCOPIC
Bacteria, UA: NONE SEEN
Bilirubin Urine: NEGATIVE
Glucose, UA: NEGATIVE mg/dL
Hgb urine dipstick: NEGATIVE
Ketones, ur: NEGATIVE mg/dL
Leukocytes,Ua: NEGATIVE
Nitrite: NEGATIVE
Protein, ur: 30 mg/dL — AB
Specific Gravity, Urine: 1.013 (ref 1.005–1.030)
pH: 6 (ref 5.0–8.0)

## 2021-10-14 LAB — MAGNESIUM: Magnesium: 2.1 mg/dL (ref 1.7–2.4)

## 2021-10-14 LAB — CBC WITH DIFFERENTIAL/PLATELET
Abs Immature Granulocytes: 0.03 10*3/uL (ref 0.00–0.07)
Basophils Absolute: 0 10*3/uL (ref 0.0–0.1)
Basophils Relative: 0 %
Eosinophils Absolute: 0.1 10*3/uL (ref 0.0–0.5)
Eosinophils Relative: 2 %
HCT: 39.6 % (ref 39.0–52.0)
Hemoglobin: 13.6 g/dL (ref 13.0–17.0)
Immature Granulocytes: 1 %
Lymphocytes Relative: 10 %
Lymphs Abs: 0.6 10*3/uL — ABNORMAL LOW (ref 0.7–4.0)
MCH: 31.1 pg (ref 26.0–34.0)
MCHC: 34.3 g/dL (ref 30.0–36.0)
MCV: 90.4 fL (ref 80.0–100.0)
Monocytes Absolute: 0.4 10*3/uL (ref 0.1–1.0)
Monocytes Relative: 7 %
Neutro Abs: 4.3 10*3/uL (ref 1.7–7.7)
Neutrophils Relative %: 80 %
Platelets: 123 10*3/uL — ABNORMAL LOW (ref 150–400)
RBC: 4.38 MIL/uL (ref 4.22–5.81)
RDW: 12.5 % (ref 11.5–15.5)
WBC: 5.4 10*3/uL (ref 4.0–10.5)
nRBC: 0 % (ref 0.0–0.2)

## 2021-10-14 LAB — COMPREHENSIVE METABOLIC PANEL WITH GFR
ALT: 180 U/L — ABNORMAL HIGH (ref 0–44)
AST: 109 U/L — ABNORMAL HIGH (ref 15–41)
Albumin: 3.6 g/dL (ref 3.5–5.0)
Alkaline Phosphatase: 242 U/L — ABNORMAL HIGH (ref 38–126)
Anion gap: 13 (ref 5–15)
BUN: 13 mg/dL (ref 8–23)
CO2: 25 mmol/L (ref 22–32)
Calcium: 9.1 mg/dL (ref 8.9–10.3)
Chloride: 96 mmol/L — ABNORMAL LOW (ref 98–111)
Creatinine, Ser: 1.14 mg/dL (ref 0.61–1.24)
GFR, Estimated: >60 mL/min
Glucose, Bld: 177 mg/dL — ABNORMAL HIGH (ref 70–99)
Potassium: 3.2 mmol/L — ABNORMAL LOW (ref 3.5–5.1)
Sodium: 134 mmol/L — ABNORMAL LOW (ref 135–145)
Total Bilirubin: 2.1 mg/dL — ABNORMAL HIGH (ref 0.3–1.2)
Total Protein: 7.4 g/dL (ref 6.5–8.1)

## 2021-10-14 LAB — PROTIME-INR
INR: 1 (ref 0.8–1.2)
Prothrombin Time: 12.9 s (ref 11.4–15.2)

## 2021-10-14 LAB — HEPATITIS PANEL, ACUTE
HCV Ab: NONREACTIVE
Hep A IgM: NONREACTIVE
Hep B C IgM: NONREACTIVE
Hepatitis B Surface Ag: NONREACTIVE

## 2021-10-14 LAB — LACTIC ACID, PLASMA
Lactic Acid, Venous: 2.3 mmol/L (ref 0.5–1.9)
Lactic Acid, Venous: 1.4 mmol/L (ref 0.5–1.9)

## 2021-10-14 LAB — LIPASE, BLOOD: Lipase: 30 U/L (ref 11–51)

## 2021-10-14 LAB — RESP PANEL BY RT-PCR (FLU A&B, COVID) ARPGX2
Influenza A by PCR: NEGATIVE
Influenza B by PCR: NEGATIVE
SARS Coronavirus 2 by RT PCR: NEGATIVE

## 2021-10-14 LAB — PROCALCITONIN: Procalcitonin: 4.1 ng/mL

## 2021-10-14 LAB — SEDIMENTATION RATE: Sed Rate: 71 mm/h — ABNORMAL HIGH (ref 0–20)

## 2021-10-14 LAB — APTT: aPTT: 35 s (ref 24–36)

## 2021-10-14 LAB — ACETAMINOPHEN LEVEL: Acetaminophen (Tylenol), Serum: 17 ug/mL (ref 10–30)

## 2021-10-14 LAB — BRAIN NATRIURETIC PEPTIDE: B Natriuretic Peptide: 54 pg/mL (ref 0.0–100.0)

## 2021-10-14 LAB — C-REACTIVE PROTEIN: CRP: 17.7 mg/dL — ABNORMAL HIGH

## 2021-10-14 MED ORDER — SODIUM CHLORIDE 0.9 % IV SOLN
2.0000 g | Freq: Once | INTRAVENOUS | Status: AC
  Administered 2021-10-14: 2 g via INTRAVENOUS
  Filled 2021-10-14: qty 2

## 2021-10-14 MED ORDER — VANCOMYCIN HCL IN DEXTROSE 1-5 GM/200ML-% IV SOLN
1000.0000 mg | Freq: Once | INTRAVENOUS | Status: AC
  Administered 2021-10-14: 21:00:00 1000 mg via INTRAVENOUS
  Filled 2021-10-14: qty 200

## 2021-10-14 MED ORDER — HEPARIN SODIUM (PORCINE) 5000 UNIT/ML IJ SOLN
5000.0000 [IU] | Freq: Three times a day (TID) | INTRAMUSCULAR | Status: DC
  Administered 2021-10-14 – 2021-10-15 (×2): 5000 [IU] via SUBCUTANEOUS
  Filled 2021-10-14 (×3): qty 1

## 2021-10-14 MED ORDER — SODIUM CHLORIDE 0.9 % IV SOLN: 2.0000 g | Freq: Once | INTRAVENOUS | Status: DC

## 2021-10-14 MED ORDER — VANCOMYCIN HCL IN DEXTROSE 1-5 GM/200ML-% IV SOLN
1000.0000 mg | Freq: Once | INTRAVENOUS | Status: AC
  Administered 2021-10-14: 1000 mg via INTRAVENOUS
  Filled 2021-10-14: qty 200

## 2021-10-14 MED ORDER — IBUPROFEN 400 MG PO TABS: 400.0000 mg | ORAL_TABLET | Freq: Four times a day (QID) | ORAL | Status: DC | PRN

## 2021-10-14 MED ORDER — OXYCODONE HCL 5 MG PO TABS
10.0000 mg | ORAL_TABLET | Freq: Four times a day (QID) | ORAL | Status: DC | PRN
  Administered 2021-10-14 – 2021-10-16 (×4): 10 mg via ORAL
  Filled 2021-10-14 (×4): qty 2

## 2021-10-14 MED ORDER — SIMVASTATIN 20 MG PO TABS
40.0000 mg | ORAL_TABLET | Freq: Every day | ORAL | Status: DC
  Administered 2021-10-14 – 2021-10-15 (×2): 40 mg via ORAL
  Filled 2021-10-14 (×2): qty 2

## 2021-10-14 MED ORDER — PIPERACILLIN-TAZOBACTAM 3.375 G IVPB
3.3750 g | Freq: Three times a day (TID) | INTRAVENOUS | Status: DC
  Administered 2021-10-14 – 2021-10-15 (×3): 3.375 g via INTRAVENOUS
  Filled 2021-10-14 (×3): qty 50

## 2021-10-14 MED ORDER — METHYLPREDNISOLONE SODIUM SUCC 40 MG IJ SOLR
40.0000 mg | Freq: Every day | INTRAMUSCULAR | Status: DC
  Administered 2021-10-14 – 2021-10-15 (×2): 40 mg via INTRAVENOUS
  Filled 2021-10-14 (×3): qty 1

## 2021-10-14 MED ORDER — ONDANSETRON HCL 4 MG/2ML IJ SOLN: 4.0000 mg | Freq: Three times a day (TID) | INTRAMUSCULAR | Status: DC | PRN

## 2021-10-14 MED ORDER — HYDRALAZINE HCL 20 MG/ML IJ SOLN: 5.0000 mg | INTRAMUSCULAR | Status: DC | PRN

## 2021-10-14 MED ORDER — POTASSIUM CHLORIDE CRYS ER 20 MEQ PO TBCR
40.0000 meq | EXTENDED_RELEASE_TABLET | Freq: Once | ORAL | Status: AC
  Administered 2021-10-14: 20:00:00 40 meq via ORAL
  Filled 2021-10-14: qty 2

## 2021-10-14 MED ORDER — DM-GUAIFENESIN ER 30-600 MG PO TB12: 1.0000 | ORAL_TABLET | Freq: Two times a day (BID) | ORAL | Status: DC | PRN

## 2021-10-14 MED ORDER — PANTOPRAZOLE SODIUM 40 MG PO TBEC
40.0000 mg | DELAYED_RELEASE_TABLET | Freq: Every day | ORAL | Status: DC
  Administered 2021-10-15 – 2021-10-16 (×2): 40 mg via ORAL
  Filled 2021-10-14 (×2): qty 1

## 2021-10-14 MED ORDER — VANCOMYCIN HCL 1750 MG/350ML IV SOLN: 1750.0000 mg | INTRAVENOUS | Status: DC

## 2021-10-14 MED ORDER — ALPRAZOLAM 0.5 MG PO TABS
0.5000 mg | ORAL_TABLET | Freq: Two times a day (BID) | ORAL | Status: DC | PRN
  Administered 2021-10-15 – 2021-10-16 (×2): 0.5 mg via ORAL
  Filled 2021-10-14 (×2): qty 1

## 2021-10-14 MED ORDER — IOHEXOL 300 MG/ML  SOLN
100.0000 mL | Freq: Once | INTRAMUSCULAR | Status: AC | PRN
  Administered 2021-10-14: 100 mL via INTRAVENOUS
  Filled 2021-10-14: qty 100

## 2021-10-14 MED ORDER — ALBUTEROL SULFATE (2.5 MG/3ML) 0.083% IN NEBU: 2.5000 mg | INHALATION_SOLUTION | RESPIRATORY_TRACT | Status: DC | PRN

## 2021-10-14 MED ORDER — LACTATED RINGERS IV BOLUS
1000.0000 mL | Freq: Once | INTRAVENOUS | Status: AC
  Administered 2021-10-14: 1000 mL via INTRAVENOUS

## 2021-10-14 MED ORDER — ASPIRIN EC 81 MG PO TBEC
81.0000 mg | DELAYED_RELEASE_TABLET | Freq: Every day | ORAL | Status: DC
  Administered 2021-10-15: 81 mg via ORAL
  Filled 2021-10-14: qty 1

## 2021-10-14 MED ORDER — METRONIDAZOLE 500 MG/100ML IV SOLN
500.0000 mg | Freq: Once | INTRAVENOUS | Status: AC
  Administered 2021-10-14: 500 mg via INTRAVENOUS
  Filled 2021-10-14: qty 100

## 2021-10-14 MED ORDER — IOHEXOL 350 MG/ML SOLN
125.0000 mL | Freq: Once | INTRAVENOUS | Status: DC | PRN
  Filled 2021-10-14: qty 125

## 2021-10-14 MED ORDER — DULOXETINE HCL 30 MG PO CPEP
60.0000 mg | ORAL_CAPSULE | Freq: Two times a day (BID) | ORAL | Status: DC
  Administered 2021-10-14 – 2021-10-16 (×4): 60 mg via ORAL
  Filled 2021-10-14 (×4): qty 2

## 2021-10-14 MED ORDER — ACETAMINOPHEN 325 MG PO TABS: 650.0000 mg | ORAL_TABLET | Freq: Four times a day (QID) | ORAL | Status: DC | PRN

## 2021-10-14 NOTE — ED Provider Notes (Signed)
HPI: Pt is a 75 y.o. male who presents with complaints of fever   The patient p/w  covid and flu neg yesterday took tylenol this AM and fever broke but given off and on few days came in to evaluate for sepsis per home doc.,  ROS:no abd pain   Past Medical History:  Diagnosis Date   Acute bronchitis 01/01/2015   Acute upper respiratory infection 05/19/2016   Anginal pain ([place])    Anxiety    Arthritis    back,wrists,hx. 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   Confusion 04/27/2017   COPD (chronic obstructive pulmonary disease) ([place])    Depression    Encounter for preventative adult health care exam with abnormal findings 12/20/2013   Fever 04/27/2017   GERD (gastroesophageal reflux disease)    Gynecomastia 05/12/2015   H/O hiatal hernia    pts wife states pt does not have    Heart murmur    History of kidney stones    Hypercholesteremia    Hyperglycemia 05/19/2016   HYPERLIPIDEMIA 04/29/2008   Qualifier: Diagnosis of  By: Jim Like     Impaired glucose tolerance 12/20/2013   Insomnia 04/27/2017   Lumbosacral radiculopathy 10/14/2016   MI (myocardial infarction) [facility])    mid-'90's   MYOCARDIAL INFARCTION 04/29/2008   Qualifier: History of  By: Jim Like     OSA (obstructive sleep apnea) 05/13/2011   Split night study 2011:  AHI 21/hr with obstructive and central events.  Placed on cpap and titrated to 13cm with reasonable control, but attempts to increase pressure increased the number of central events.  Medium resmed quattro full face mask used.     Peripheral neuropathy    peripheral neuropathy   Phimosis/adherent prepuce 10/21/2013   Pneumonia    Prostate  cancer ([place])    Shortness of breath 11/05/2010   Qualifier: Diagnosis of  By: Marilynne Halsted, RN, BSN, Jacquelyn     Sleep apnea    mild-no cpap, unable to tolerate   Squamous cell carcinoma of skin 10/29/2020   right mid volar forearm   Umbilical hernia    Umbilical hernia without obstruction and without gangrene 05/19/2016   URI (upper respiratory infection) 04/27/2017   UTI (urinary tract infection) 12/01/2016   Varicose veins    Varicose veins of bilateral lower extremities with other complications 67/89/3810   Wheezing 01/01/2015   Vitals:   10/14/21 0858  BP: 133/67  Pulse: 83  Resp: 18  Temp: 97.6 F (36.4 C)  SpO2: 94%    Focused Physical Exam: Gen: No acute distress Head: atraumatic, normocephalic Eyes: Extraocular movements grossly intact; conjunctiva clear CV: RRR Lung: No increased WOB, no stridor GI: ND, no obvious masses Neuro: Alert and awake  Medical Decision Making and Plan: Given the patient's initial medical screening exam, the following diagnostic evaluation has been ordered. The patient will be placed in the appropriate treatment space, once one is available, to complete the evaluation and treatment. I have discussed the plan of care with the patient and I have advised the patient that an ED physician or mid-level practitioner will reevaluate their condition after  the test results have been received, as the results may give them additional insight into the type of treatment they may need.   Diagnostics: labs   Treatments: none immediately   Vanessa [place], MD 10/14/21 [phone]

## 2021-10-14 NOTE — Consult Note (Signed)
CODE SEPSIS - PHARMACY COMMUNICATION  **Broad Spectrum Antibiotics should be administered within 1 hour of Sepsis diagnosis**  Time Code Sepsis Called/Page Received: 5300  Antibiotics Ordered: cefepime, metronidazole, vancomycin   Time of 1st antibiotic administration: [place], PharmD Pharmacy Resident  10/14/2021 1:48 PM

## 2021-10-14 NOTE — H&P (Signed)
History and Physical    MARKEY Wade IBB:[id number] DOB: 18-Apr-1946 DOA: 10/14/2021  Referring MD/NP/PA:   PCP: Biagio Borg, MD   Patient coming from:  The patient is coming from home.  At baseline, pt is independent for most of ADL.        Chief Complaint: fever  HPI: Albert Wade is a 75 y.o. male with medical history significant of hypertension, hyperlipidemia, COPD, GERD, depression with anxiety, varicose vein, prostate cancer, BPH, OSA, CAD, myocardial infarction, dCHF, who presents with fever.  Patient states that he has fever for more than 4 days, one time with temperature 103.4 at home.  Patient denies chest pain, cough, shortness of breath.  No nausea, vomiting, diarrhea or abdominal pain. Patient reports mild dysuria and burning on urination, no hematuria.  He has mild right flank pain.  He also complains of pain in right temporal area of his head, right jaw and right neck. No neck stiffness.  No ear pain or ear drainage.  Per his wife at the bedside, patient had disorientation when he had fever, but currently patient is alert and oriented x3, no confusion.  ED Course: pt was found to have WBC 5.4, lactic acid 2.3, INR 1.0, PTT 35, negative urinalysis, negative COVID PCR, potassium 3.4, GFR > 60, abnormal liver function (ALP 242, AST 109, ALT 180, total bilirubin 2.1), pending lipase.  Temperature 97.6, blood pressure 196/86, 137/68, heart rate 85, RR 20, oxygen saturation 94% on room air.  Chest x-ray negative.  CT of neck soft tissue is negative.  Patient is placed on MedSurg bed for position  CT of abdomen/pelvis: 1. No CT evidence for acute intra-abdominal or pelvic abnormality. 2. Minimal right hydronephrosis with reimplantation of right ureter. No obstructing stone.  Review of Systems:   General: has fevers, chills, no body weight gain, has fatigue HEENT: no blurry vision, hearing changes or sore throat Respiratory: no dyspnea, coughing, wheezing CV: no chest  pain, no palpitations GI: no nausea, vomiting, abdominal pain, diarrhea, constipation GU: has dysuria, burning on urination, no  increased urinary frequency, hematuria  Ext: has trace leg edema Neuro: no unilateral weakness, numbness, or tingling, no vision change or hearing loss. Has disorientation. Skin: no rash, no skin tear. MSK: No muscle spasm, no deformity, no limitation of range of movement in spin Heme: No easy bruising.  Travel history: No recent long distant travel.  Allergy:  Allergies  Allergen Reactions   Cyclobenzaprine     agitation    Seroquel [Quetiapine] Hives    Past Medical History:  Diagnosis Date   Acute bronchitis 01/01/2015   Acute upper respiratory infection 05/19/2016   Anginal pain (HCC)    Anxiety    Arthritis    back,wrists,hx. 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   Confusion 04/27/2017   COPD (chronic obstructive pulmonary disease) ([place])    Depression    Encounter for preventative adult health care exam with abnormal findings 12/20/2013   Fever 04/27/2017   GERD (gastroesophageal reflux disease)    Gynecomastia 05/12/2015   H/O hiatal hernia    pts wife  states pt does not have    Heart murmur    History of kidney stones    HTN (hypertension)    Hypercholesteremia    Hyperglycemia 05/19/2016   HYPERLIPIDEMIA 04/29/2008   Qualifier: Diagnosis of  By: Jim Like     Impaired glucose tolerance 12/20/2013   Insomnia 04/27/2017   Lumbosacral radiculopathy 10/14/2016   MI (myocardial infarction) [facility])    mid-'90's   MYOCARDIAL INFARCTION 04/29/2008   Qualifier: History of  By: Jim Like     OSA (obstructive sleep apnea)  05/13/2011   Split night study 2011:  AHI 21/hr with obstructive and central events.  Placed on cpap and titrated to 13cm with reasonable control, but attempts to increase pressure increased the number of central events.  Medium resmed quattro full face mask used.     Peripheral neuropathy    peripheral neuropathy   Phimosis/adherent prepuce 10/21/2013   Pneumonia    Prostate cancer ([place])    Shortness of breath 11/05/2010   Qualifier: Diagnosis of  By: Marilynne Halsted, RN, BSN, Jacquelyn     Sleep apnea    mild-no cpap, unable to tolerate   Squamous cell carcinoma of skin 10/29/2020   right mid volar forearm   Umbilical hernia    Umbilical hernia without obstruction and without gangrene 05/19/2016   URI (upper respiratory infection) 04/27/2017   UTI (urinary tract infection) 12/01/2016   Varicose veins    Varicose veins of bilateral lower extremities with other complications 78/93/8101   Wheezing 01/01/2015    Past Surgical History:  Procedure Laterality Date   ANGIOPLASTY     APPENDECTOMY     BACK SURGERY     x3   CARDIAC CATHETERIZATION     11'11   CIRCUMCISION N/A 10/21/2013   Procedure: CIRCUMCISION ADULT;  Surgeon: Bernestine Amass, MD;  Location: [facility];  Service: Urology;  Laterality: N/A;   COLONOSCOPY WITH PROPOFOL N/A 06/09/2021   Procedure: COLONOSCOPY WITH PROPOFOL;  Surgeon: Jonathon Bellows, MD;  Location: [facility] ENDOSCOPY;  Service: Gastroenterology;  Laterality: N/A;   CYSTOSCOPY N/A 10/21/2013   Procedure: CYSTOSCOPY FLEXIBLE;  Surgeon: Bernestine Amass, MD;  Location: [facility];  Service: Urology;  Laterality: N/A;   CYSTOSCOPY W/ URETERAL STENT PLACEMENT Right 06/01/2020   Procedure: CYSTOSCOPY WITH RETROGRADE PYELOGRAM/URETERAL STENT Exchange;  Surgeon: Hollice Espy, MD;  Location: [facility] ORS;  Service: Urology;  Laterality: Right;   CYSTOSCOPY WITH BIOPSY Right 01/06/2020   Procedure: CYSTOSCOPY WITH BIOPSY;  Surgeon: Hollice Espy, MD;  Location: [facility] ORS;  Service: Urology;  Laterality:  Right;   CYSTOSCOPY/URETEROSCOPY/HOLMIUM LASER/STENT PLACEMENT Right 01/06/2020   Procedure: CYSTOSCOPY/URETEROSCOPY/HOLMIUM LASER/STENT PLACEMENT;  Surgeon: Hollice Espy, MD;  Location: [facility] ORS;  Service: Urology;  Laterality: Right;   ESOPHAGOGASTRODUODENOSCOPY N/A 02/03/2021   Procedure: ESOPHAGOGASTRODUODENOSCOPY (EGD);  Surgeon: Jonathon Bellows, MD;  Location[facility] ENDOSCOPY;  Service: Gastroenterology;  Laterality: N/A;   ESOPHAGOGASTRODUODENOSCOPY (EGD) WITH PROPOFOL N/A 03/09/2021   Procedure: ESOPHAGOGASTRODUODENOSCOPY (EGD) WITH PROPOFOL;  Surgeon: Jonathon Bellows, MD;  Location[facility] ENDOSCOPY;  Service: Gastroenterology;  Laterality: N/A;   HERNIA REPAIR     INSERTION OF MESH N/A 06/17/2016   Procedure: INSERTION OF MESH;  Surgeon: Excell Seltzer, MD;  Location: [facility];  Service: General;  Laterality: N/A;   UMBILICAL HERNIA REPAIR N/A 06/17/2016   Procedure: REPAIR UMBILICAL HERNIA WITH MESH;  Surgeon: Excell Seltzer, MD;  Location: [facility];  Service: General;  Laterality: N/A;   WRIST SURGERY Right     Social History:  reports  that he quit smoking about 25 years ago. His smoking use included cigarettes. He has a 20.00 pack-year smoking history. He has never used smokeless tobacco. He reports that he does not drink alcohol and does not use drugs.  Family History:  Family History  Problem Relation Age of Onset   Prostate cancer Father    Emphysema Father    Hyperlipidemia Father    Hyperlipidemia Mother      Prior to Admission medications   Medication Sig Start Date End Date Taking? Authorizing Provider  albuterol (VENTOLIN HFA) 108 (90 Base) MCG/ACT inhaler INHALE 2 PUFFS INTO THE LUNGS 4 TIMES DAILY AS NEEDED 10/19/20   Biagio Borg, MD  ALPRAZolam Duanne Moron) 0.5 MG tablet Take 0.5 mg by mouth 2 (two) times daily as needed for anxiety. 01/28/21   [provider]  amLODipine (NORVASC) 2.5 MG tablet Take 1 tablet (2.5 mg total) by mouth daily. 09/16/21   Fay Records, MD   aspirin 81 MG tablet Take 81 mg by mouth daily.    [provider]  DULoxetine (CYMBALTA) 60 MG capsule Take 60 mg by mouth 2 (two) times daily.  04/23/20   [provider]  eszopiclone 3 MG TABS Take 3 mg by mouth at bedtime. Take immediately before bedtime    [provider]  fluticasone (FLOVENT HFA) 44 MCG/ACT inhaler Inhale 2 puffs into the lungs 2 (two) times daily as needed. 09/17/21   Fay Records, MD  furosemide (LASIX) 40 MG tablet Take 1 tablet (40 mg total) by mouth 2 (two) times daily. 07/21/21   Biagio Borg, MD  omeprazole (PRILOSEC) 20 MG capsule Take 1 capsule (20 mg total) by mouth 2 (two) times daily. 04/08/21 04/08/22  Jonathon Bellows, MD  oxyCODONE-acetaminophen (PERCOCET) 10-325 MG tablet Take 1 tablet by mouth every 4 (four) hours as needed for pain. Patient taking differently: Take 1-2 tablets by mouth every 4 (four) hours as needed for pain. Max 8 per day 01/06/20   Hollice Espy, MD  potassium chloride (KLOR-CON) 10 MEQ tablet Take 2 tablets (20 mEq total) by mouth daily. 09/20/21   Fay Records, MD  simvastatin (ZOCOR) 40 MG tablet Take 1 tablet (40 mg total) by mouth at bedtime. 09/20/21   Fay Records, MD    Physical Exam: Vitals:   10/14/21 0858 10/14/21 1426  BP: 133/67 137/68  Pulse: 83 85  Resp: 18 20  Temp: 97.6 F (36.4 C)   TempSrc: Oral   SpO2: 94% 96%   General: Not in acute distress HEENT:       Eyes: PERRL, EOMI, no scleral icterus.       ENT: No discharge from the ears and nose, no pharynx injection, no tonsillar enlargement.        Neck: No JVD, no bruit, no mass felt. Heme: No neck lymph node enlargement. Cardiac: S1/S2, RRR,  No gallops or rubs. Respiratory: No rales, wheezing, rhonchi or rubs. GI: Soft, nondistended, nontender, no rebound pain, no organomegaly, BS present. GU: No hematuria Ext: has trace leg edema bilaterally. 1+DP/PT pulse bilaterally. Musculoskeletal: No joint deformities, No joint redness or  warmth, no limitation of ROM in spin. Skin: No rashes.  Neuro: Alert, oriented X3, cranial nerves II-XII grossly intact, moves all extremities normally.  Psych: Patient is not psychotic, no suicidal or hemocidal ideation.  Labs on Admission: I have personally reviewed following labs and imaging studies  CBC: Recent Labs  Lab 10/14/21 0901  WBC 5.4  NEUTROABS 4.3  HGB 13.6  HCT 39.6  MCV 90.4  PLT 191*   Basic Metabolic Panel: Recent Labs  Lab 10/14/21 0901  NA 134*  K 3.2*  CL 96*  CO2 25  GLUCOSE 177*  BUN 13  CREATININE 1.14  CALCIUM 9.1   GFR: CrCl cannot be calculated (Unknown ideal weight.). Liver Function Tests: Recent Labs  Lab 10/14/21 0901  AST 109*  ALT 180*  ALKPHOS 242*  BILITOT 2.1*  PROT 7.4  ALBUMIN 3.6   No results for input(s): LIPASE, AMYLASE in the last 168 hours. No results for input(s): AMMONIA in the last 168 hours. Coagulation Profile: Recent Labs  Lab 10/14/21 0901  INR 1.0   Cardiac Enzymes: No results for input(s): CKTOTAL, CKMB, CKMBINDEX, TROPONINI in the last 168 hours. BNP (last 3 results) Recent Labs    07/21/21 0948 09/16/21 1639  PROBNP 170.0* 110   HbA1C: No results for input(s): HGBA1C in the last 72 hours. CBG: No results for input(s): GLUCAP in the last 168 hours. Lipid Profile: No results for input(s): CHOL, HDL, LDLCALC, TRIG, CHOLHDL, LDLDIRECT in the last 72 hours. Thyroid Function Tests: No results for input(s): TSH, T4TOTAL, FREET4, T3FREE, THYROIDAB in the last 72 hours. Anemia Panel: No results for input(s): VITAMINB12, FOLATE, FERRITIN, TIBC, IRON, RETICCTPCT in the last 72 hours. Urine analysis:    Component Value Date/Time   COLORURINE AMBER (A) 10/14/2021 1315   APPEARANCEUR HAZY (A) 10/14/2021 1315   APPEARANCEUR Cloudy (A) 07/14/2020 1349   LABSPEC 1.013 10/14/2021 1315   PHURINE 6.0 10/14/2021 1315   GLUCOSEU NEGATIVE 10/14/2021 [place] 07/21/2021 0948   HGBUR NEGATIVE  10/14/2021 [place] 10/14/2021 1315   BILIRUBINUR Negative 07/14/2020 [place] 10/14/2021 1315   PROTEINUR 30 (A) 10/14/2021 1315   UROBILINOGEN 1.0 07/21/2021 0948   NITRITE NEGATIVE 10/14/2021 1315   LEUKOCYTESUR NEGATIVE 10/14/2021 1315   Sepsis Labs: _0 (procalcitonin:4,lacticidven:4) ) Recent Results (from the past 240 hour(s))  Resp Panel by RT-PCR (Flu A&B, Covid) Nasopharyngeal Swab     Status: None   Collection Time: 10/14/21  9:01 AM   Specimen: Nasopharyngeal Swab; Nasopharyngeal(NP) swabs in vial transport medium  Result Value Ref Range Status   SARS Coronavirus 2 by RT PCR NEGATIVE NEGATIVE Final    Comment: (NOTE) SARS-CoV-2 target nucleic acids are NOT DETECTED.  The SARS-CoV-2 RNA is generally detectable in upper respiratory specimens during the acute phase of infection. The lowest concentration of SARS-CoV-2 viral copies this assay can detect is 138 copies/mL. A negative result does not preclude SARS-Cov-2 infection and should not be used as the sole basis for treatment or other patient management decisions. A negative result may occur with  improper specimen collection/handling, submission of specimen other than nasopharyngeal swab, presence of viral mutation(s) within the areas targeted by this assay, and inadequate number of viral copies(<138 copies/mL). A negative result must be combined with clinical observations, patient history, and epidemiological information. The expected result is Negative.  Fact Sheet for Patients:  EntrepreneurPulse.com.au  Fact Sheet for Healthcare Providers:  IncredibleEmployment.be  This test is no t yet approved or cleared by the [place] FDA and  has been authorized for detection and/or diagnosis of SARS-CoV-2 by FDA under an Emergency Use Authorization (EUA). This EUA will remain  in effect (meaning this test can be used) for the duration of  the COVID-19 declaration under Section 564(b)(1) of the Act, 21 U.S.C.section 360bbb-3(b)(1), unless the authorization  is terminated  or revoked sooner.       Influenza A by PCR NEGATIVE NEGATIVE Final   Influenza B by PCR NEGATIVE NEGATIVE Final    Comment: (NOTE) The Xpert Xpress SARS-CoV-2/FLU/RSV plus assay is intended as an aid in the diagnosis of influenza from Nasopharyngeal swab specimens and should not be used as a sole basis for treatment. Nasal washings and aspirates are unacceptable for Xpert Xpress SARS-CoV-2/FLU/RSV testing.  Fact Sheet for Patients: EntrepreneurPulse.com.au  Fact Sheet for Healthcare Providers: IncredibleEmployment.be  This test is not yet approved or cleared by the [place] FDA and has been authorized for detection and/or diagnosis of SARS-CoV-2 by FDA under an Emergency Use Authorization (EUA). This EUA will remain in effect (meaning this test can be used) for the duration of the COVID-19 declaration under Section 564(b)(1) of the Act, 21 U.S.C. section 360bbb-3(b)(1), unless the authorization is terminated or revoked.  Performed at [facility], [address] [place], [place] [address]      Radiological Exams on Admission: DG Chest 2 View  Result Date: 10/14/2021 CLINICAL DATA:  Headache, right-sided ear pain since Saturday. History of COPD EXAM: CHEST - 2 VIEW COMPARISON:  Chest radiograph 2 9 FINDINGS: The cardiomediastinal silhouette is stable. There is slight asymmetric elevation of the right hemidiaphragm. Linear opacities projecting over the right mid lung likely reflect atelectasis or scar. There is no focal consolidation. There is no pulmonary edema. There is no pleural effusion or pneumothorax. There is mild degenerative change of the imaged spine. There is no acute osseous abnormality. IMPRESSION: No radiographic evidence of acute cardiopulmonary process. Electronically Signed   By:  Valetta Mole M.D.   On: 10/14/2021 09:30   CT Soft Tissue Neck W Contrast  Result Date: 10/14/2021 CLINICAL DATA:  Rule out neck abscess. Headache and fever. Right ear pain. EXAM: CT NECK WITH CONTRAST TECHNIQUE: Multidetector CT imaging of the neck was performed using the standard protocol following the bolus administration of intravenous contrast. CONTRAST:  112m OMNIPAQUE IOHEXOL 300 MG/ML  SOLN COMPARISON:  None. FINDINGS: Pharynx and larynx: Normal. No mass or swelling. Salivary glands: No inflammation, mass, or stone. Incidental lipoma posterior to the submandibular gland on the right. Thyroid: Small thyroid.  No focal lesion. Lymph nodes: Negative for enlarged or pathologic lymph nodes in the neck. Vascular: Normal arterial enhancement. Atherosclerotic disease in the carotid bifurcation bilaterally. Limited intracranial: Negative Visualized orbits: Negative Mastoids and visualized paranasal sinuses: Paranasal sinuses clear. Mastoid clear bilaterally. Skeleton: Cervical spondylosis.  No acute skeletal abnormality. Upper chest: Lung apices clear bilaterally.  No acute abnormality Other: None IMPRESSION: Negative for pharyngeal mass or abscess. No acute abnormality in the neck. Electronically Signed   By: CFranchot GalloM.D.   On: 10/14/2021 15:42   CT Abdomen Pelvis W Contrast  Result Date: 10/14/2021 CLINICAL DATA:  Abdomen pain EXAM: CT ABDOMEN AND PELVIS WITH CONTRAST TECHNIQUE: Multidetector CT imaging of the abdomen and pelvis was performed using the standard protocol following bolus administration of intravenous contrast. CONTRAST:  1076mOMNIPAQUE IOHEXOL 300 MG/ML  SOLN COMPARISON:  CT 10/18/2019, nuclear medicine renal imaging 09/07/2020 FINDINGS: Lower chest: Lung bases demonstrate no acute consolidation or effusion. Cardiac size within normal limits. Hepatobiliary: No focal liver abnormality is seen. No gallstones, gallbladder wall thickening, or biliary dilatation. Pancreas: Unremarkable.  No pancreatic ductal dilatation or surrounding inflammatory changes. Spleen: Normal in size without focal abnormality. Adrenals/Urinary Tract: Adrenal glands are normal. Minimal right hydronephrosis without obstructing stone. Status post right  ureteral reimplantation to the right bladder dome. Bladder is otherwise unremarkable. Stomach/Bowel: Stomach is within normal limits. Appendix not seen corresponding to history of appendectomy. No evidence of bowel wall thickening, distention, or inflammatory changes. Vascular/Lymphatic: Mild aortic atherosclerosis. No aneurysm. No suspicious nodes Reproductive: Negative for mass. Post treatment changes of the prostate. Other: Negative for pelvic effusion or free air Musculoskeletal: No acute osseous abnormality. Interbody devices at L4-L5 and L5-S1. IMPRESSION: 1. No CT evidence for acute intra-abdominal or pelvic abnormality. 2. Minimal right hydronephrosis with reimplantation of right ureter. No obstructing stone. Electronically Signed   By: Donavan Foil M.D.   On: 10/14/2021 15:59     EKG: I have personally reviewed.  Sinus rhythm, QTC 428, Q waves in inferior leads  Assessment/Plan Principal Problem:   Fever Active Problems:   HLD (hyperlipidemia)   Depression with anxiety   Abnormal LFTs   Chronic diastolic CHF (congestive heart failure) (HCC)   COPD (chronic obstructive pulmonary disease) (HCC)   CAD (coronary artery disease)   Acute metabolic encephalopathy   Hypokalemia   HTN (hypertension)   Elevated lactic acid level   Fever: Patient reports fever at home, 103.4 at one time.  Patient took Tylenol, his temperature is 97.6 in ED. Etiology is not clear.  No leukocytosis.  Urinalysis negative.  Chest x-ray negative.  Patient complains of pain in the right neck, right jaw and right temporal area, but CT of neck is negative.  Patient has abnormal liver function, but CT scan of abdomen/pelvis is negative for biliary duct dilation or gallstones. Will  get US-RUQ for further evaluation.  Since patient has pain in the right temporal area, giant cell arteritis is differential diagnosis, though not typically at his age 13.  Will start steroids empirically.  Currently patient is alert and oriented x3, no confusion.  No neck stiffness, neck supple, low suspicions for meningitis.  If no source of infection identified, may need to do biopsy to rule out giant cell arteritis. Patient has elevated lactic acid, but no leukocytosis or tachycardia.  Does not meet criteria for sepsis currently.  -will place on MedSurg bed for obs -Empiric antibiotics with vancomycin and Zosyn (patient received 1 dose of cefepime and Flagyl in ED) -Start Solu-Medrol 40 mg daily -Check ESR and CRP -Blood culture and urine culture -IV fluid: Patient received 1 L LR in ED (patient has CHF, limiting aggressive IV fluid treatment)  Abnormal liver function -Check Tylenol level -Avoid using Tylenol -Hepatitis panel -Follow-up ultrasound right upper quadrant  Elevated lactic acid level: Lactic acid 2.3.  Does not meet criteria for sepsis -IV fluid: 1 L LR -Trend lactic acid level  HLD (hyperlipidemia) -Zocor  Depression with anxiety -Continue home medications  Chronic diastolic CHF (congestive heart failure) ([place]): 2D echo on 08/10/2021 showed EF of 55 to 60% with grade 1 diastolic dysfunction.  Patient has trace leg edema, no JVD, chest x-ray negative for pulmonary edema, clinically CHF seem to be compensated. -Hold Lasix since patient at risk of developing sepsis  COPD (chronic obstructive pulmonary disease) ([place]): No wheezing or rhonchi -Bronchodilators  CAD (coronary artery disease): No chest pain -Aspirin, Zocor  Acute metabolic encephalopathy: Patient had disorientation at home when patient had a fever, but currently patient is alert and oriented x3.  No confusion -Frequent neuro check  Hypokalemia: K 3.2 -Repleted potassium -Check magnesium level  HTN  (hypertension): Blood pressure 196/86, 137/68. -IV hydralazine as needed -Hold amlodipine since patient is at risk of developing sepsis  DVT ppx: SQ Heparin    Code Status: Full code Family Communication:     Yes, patient's wife at bed side Disposition Plan:  Anticipate discharge back to previous environment Consults called:  none Admission status and Level of care: Med-Surg:    for obs    Status is: Observation  The patient remains OBS appropriate and will d/c before 2 midnights.          Date of Service 10/14/2021    Ivor Costa [facility]   If 7PM-7AM, please contact night-coverage [email or web address] 10/14/2021, 5:25 PM

## 2021-10-14 NOTE — ED Notes (Signed)
Critical lactic of 2.3 notified to provider at this time.

## 2021-10-14 NOTE — Sepsis Progress Note (Signed)
Second LA collected but no result in chart, sent secure chat to bedside RN, waiting for reply

## 2021-10-14 NOTE — Consult Note (Signed)
Pharmacy Antibiotic Note  Albert Wade is a 75 y.o. male admitted on 10/14/2021 with sepsis. Pharmacy has been consulted for vancomycin and Zosyn dosing.  Plan: Vancomycin 2000 mg IV loading dose, followed by 1750 mg IV q24h  Goal AUC 400-550  Est AUC: 489.5 Est Cmax: 35.8 Est Cmin: 11.3 Calculated with SCr 1.14, Vd 0.72  Zosyn 3.375 gm IV q8h (4 hour infusion)  Monitor clinical picture, renal function, and vancomycin levels at steady state  F/U C&S, abx deescalation / LOT   Weight: 94.8 kg (209 lb)  Temp (24hrs), Avg:98.2 F (36.8 C), Min:97.6 F (36.4 C), Max:98.8 F (37.1 C)  Recent Labs  Lab 10/14/21 0901 10/14/21 1315  WBC 5.4  --   CREATININE 1.14  --   LATICACIDVEN  --  2.3*    Estimated Creatinine Clearance: 65.3 mL/min (by C-G formula based on SCr of 1.14 mg/dL).    Allergies  Allergen Reactions   Cyclobenzaprine     agitation    Seroquel [Quetiapine] Hives    Antimicrobials this admission: 10/20 cefepime and Flagyl x 1 10/20 vancomycin >>  10/20 Zosyn >>   Dose adjustments this admission: N/A  Microbiology results: 10/20 BCx: pending 10/20 UCx: pending   Thank you for allowing pharmacy to be a part of this patient's care.  Darnelle Bos, PharmD 10/14/2021 6:47 PM

## 2021-10-14 NOTE — ED Provider Notes (Signed)
[facility] Emergency Department Provider Note   ____________________________________________   Event Date/Time   First MD Initiated Contact with Patient 10/14/21 1222     (approximate)  I have reviewed the triage vital signs and the nursing notes.   HISTORY  Chief Complaint Fever and Ear Pain    HPI Albert Wade is a 75 y.o. male with past medical history of hypertension, hyperlipidemia, CAD, CHF, COPD, and prostate cancer who presents to the ED complaining of fever.  60 of history is obtained from patient's wife, who states that patient has been dealing with 4 days of temperatures as high as 103.4 at home.  He takes Percocet regularly for pain, which wife states has been helping with his fever.  He has seemed weaker than usual and occasionally disoriented.  He currently complains of pain underneath his right jaw, where he feels like he has a swollen lymph node.  He denies any sore throat, dental pain, or ear pain.  He has not had any fever or neck stiffness.  He does endorse dysuria with right-sided flank pain that have been present for the past 4 days.  Pain is described as sharp and constant, not exacerbated or alleviated by anything in particular.  He has not had any abdominal pain, nausea, vomiting, or diarrhea.        Past Medical History:  Diagnosis Date   Acute bronchitis 01/01/2015   Acute upper respiratory infection 05/19/2016   Anginal pain (HCC)    Anxiety    Arthritis    back,wrists,hx. 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   Confusion 04/27/2017   COPD (chronic obstructive pulmonary disease) ([place])    Depression     Encounter for preventative adult health care exam with abnormal findings 12/20/2013   Fever 04/27/2017   GERD (gastroesophageal reflux disease)    Gynecomastia 05/12/2015   H/O hiatal hernia    pts wife states pt does not have    Heart murmur    History of kidney stones    Hypercholesteremia    Hyperglycemia 05/19/2016   HYPERLIPIDEMIA 04/29/2008   Qualifier: Diagnosis of  By: Jim Like     Impaired glucose tolerance 12/20/2013   Insomnia 04/27/2017   Lumbosacral radiculopathy 10/14/2016   MI (myocardial infarction) [facility]    mid-'90's   MYOCARDIAL INFARCTION 04/29/2008   Qualifier: History of  By: Jim Like     OSA (obstructive sleep apnea) 05/13/2011   Split night study 2011:  AHI 21/hr with obstructive and central events.  Placed on cpap and titrated to 13cm with reasonable control, but attempts to increase pressure increased the number of central events.  Medium resmed quattro full face mask used.     Peripheral neuropathy    peripheral neuropathy   Phimosis/adherent prepuce 10/21/2013   Pneumonia    Prostate cancer ([place])    Shortness of breath 11/05/2010   Qualifier: Diagnosis of  By: Marilynne Halsted, RN, BSN, Jacquelyn     Sleep apnea    mild-no cpap, unable to tolerate   Squamous cell carcinoma of skin 10/29/2020   right mid volar forearm  Umbilical hernia    Umbilical hernia without obstruction and without gangrene 05/19/2016   URI (upper respiratory infection) 04/27/2017   UTI (urinary tract infection) 12/01/2016   Varicose veins    Varicose veins of bilateral lower extremities with other complications 37/62/8315   Wheezing 01/01/2015    Patient Active Problem List   Diagnosis Date Noted   Body aches 17/61/6073   Systolic congestive heart failure ([place]) 07/21/2021   GI obstruction ([place]) 02/15/2021   RUQ pain 11/09/2020   Ureteral stricture, right 06/01/2020   Weight loss 10/08/2019   Abdominal pain 10/08/2019   Therapeutic opioid induced constipation 10/08/2019    Fever 04/27/2017   URI (upper respiratory infection) 04/27/2017   Slowing, urinary stream 04/27/2017   Confusion 04/27/2017   Insomnia 04/27/2017   Prostate cancer ([place]) 01/27/2017   UTI (urinary tract infection) 12/01/2016   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/2016   Lumbosacral radiculopathy 10/14/2016   Varicose veins of bilateral lower extremities with other complications 71/05/2693   Umbilical hernia without obstruction and without gangrene 05/19/2016   Hyperglycemia 05/19/2016   Acute upper respiratory infection 05/19/2016   Gynecomastia 05/12/2015   Encounter for preventative adult health care exam with abnormal findings 12/20/2013   GERD (gastroesophageal reflux disease)    Anxiety    Chronic back pain    Depression    BPH (benign prostatic hypertrophy)    Peripheral neuropathy    Phimosis/adherent prepuce 10/21/2013   OSA (obstructive sleep apnea) 05/13/2011   Coronary atherosclerosis 10/16/2009   HLD (hyperlipidemia) 04/29/2008   MYOCARDIAL INFARCTION 04/29/2008   BRONCHITIS, CHRONIC 04/29/2008    Past Surgical History:  Procedure Laterality Date   ANGIOPLASTY     APPENDECTOMY     BACK SURGERY     x3   CARDIAC CATHETERIZATION     11'11   CIRCUMCISION N/A 10/21/2013   Procedure: CIRCUMCISION ADULT;  Surgeon: Bernestine Amass, MD;  Location: [facility] ORS;  Service: Urology;  Laterality: N/A;   COLONOSCOPY WITH PROPOFOL N/A 06/09/2021   Procedure: COLONOSCOPY WITH PROPOFOL;  Surgeon: Jonathon Bellows, MD;  Location: [facility] ENDOSCOPY;  Service: Gastroenterology;  Laterality: N/A;   CYSTOSCOPY N/A 10/21/2013   Procedure: CYSTOSCOPY FLEXIBLE;  Surgeon: Bernestine Amass, MD;  Location: [facility] ORS;  Service: Urology;  Laterality: N/A;   CYSTOSCOPY W/ URETERAL STENT PLACEMENT Right 06/01/2020   Procedure: CYSTOSCOPY WITH RETROGRADE PYELOGRAM/URETERAL STENT Exchange;  Surgeon: Hollice Espy, MD;  Location: [facility] ORS;  Service: Urology;  Laterality: Right;   CYSTOSCOPY WITH BIOPSY Right  01/06/2020   Procedure: CYSTOSCOPY WITH BIOPSY;  Surgeon: Hollice Espy, MD;  Location: [facility] ORS;  Service: Urology;  Laterality: Right;   CYSTOSCOPY/URETEROSCOPY/HOLMIUM LASER/STENT PLACEMENT Right 01/06/2020   Procedure: CYSTOSCOPY/URETEROSCOPY/HOLMIUM LASER/STENT PLACEMENT;  Surgeon: Hollice Espy, MD;  Location: [facility] ORS;  Service: Urology;  Laterality: Right;   ESOPHAGOGASTRODUODENOSCOPY N/A 02/03/2021   Procedure: ESOPHAGOGASTRODUODENOSCOPY (EGD);  Surgeon: Jonathon Bellows, MD;  Location: [facility] ENDOSCOPY;  Service: Gastroenterology;  Laterality: N/A;   ESOPHAGOGASTRODUODENOSCOPY (EGD) WITH PROPOFOL N/A 03/09/2021   Procedure: ESOPHAGOGASTRODUODENOSCOPY (EGD) WITH PROPOFOL;  Surgeon: Jonathon Bellows, MD;  Location: [facility] END[facility]COPY;  Service: Gastroenterology;  Laterality: N/A;   HERNIA REPAIR     INSERTION OF MESH N/A 06/17/2016   Procedure: INSERTION OF MESH;  Surgeon: Excell Seltzer, MD;  Location: [facility] ORS;  Service: General;  Laterality: N/A;   UMBILICAL HERNIA REPAIR N/A 06/17/2016   Procedure: REPAIR UMBILICAL HERNIA WITH MESH;  Surgeon: Excell Seltzer, MD;  Location: [facility] ORS;  Service: General;  Laterality: N/A;   WRIST SURGERY Right     Prior to Admission medications   Medication Sig Start Date End Date Taking? Authorizing Provider  albuterol (VENTOLIN HFA) 108 (90 Base) MCG/ACT inhaler INHALE 2 PUFFS INTO THE LUNGS 4 TIMES DAILY AS NEEDED 10/19/20   Biagio Borg, MD  ALPRAZolam Duanne Moron) 0.5 MG tablet Take 0.5 mg by mouth 2 (two) times daily as needed for anxiety. 01/28/21   [provider]  amLODipine (NORVASC) 2.5 MG tablet Take 1 tablet (2.5 mg total) by mouth daily. 09/16/21   Fay Records, MD  aspirin 81 MG tablet Take 81 mg by mouth daily.    [provider]  DULoxetine (CYMBALTA) 60 MG capsule Take 60 mg by mouth 2 (two) times daily.  04/23/20   [provider]  eszopiclone 3 MG TABS Take 3 mg by mouth at bedtime. Take immediately before bedtime    [provider]  fluticasone (FLOVENT HFA) 44 MCG/ACT inhaler Inhale 2 puffs into the lungs 2 (two) times daily as needed. 09/17/21   Fay Records, MD  furosemide (LASIX) 40 MG tablet Take 1 tablet (40 mg total) by mouth 2 (two) times daily. 07/21/21   Biagio Borg, MD  omeprazole (PRILOSEC) 20 MG capsule Take 1 capsule (20 mg total) by mouth 2 (two) times daily. 04/08/21 04/08/22  Jonathon Bellows, MD  oxyCODONE-acetaminophen (PERCOCET) 10-325 MG tablet Take 1 tablet by mouth every 4 (four) hours as needed for pain. Patient taking differently: Take 1-2 tablets by mouth every 4 (four) hours as needed for pain. Max 8 per day 01/06/20   Hollice Espy, MD  potassium chloride (KLOR-CON) 10 MEQ tablet Take 2 tablets (20 mEq total) by mouth daily. 09/20/21   Fay Records, MD  simvastatin (ZOCOR) 40 MG tablet Take 1 tablet (40 mg total) by mouth at bedtime. 09/20/21   Fay Records, MD    Allergies Cyclobenzaprine and Seroquel [quetiapine]  Family History  Problem Relation Age of Onset   Prostate cancer Father    Emphysema Father    Hyperlipidemia Father    Hyperlipidemia Mother     Social History Social History   Tobacco Use   Smoking status: Former    Packs/day: 0.50    Years: 40.00    Pack years: 20.00    Types: Cigarettes    Quit date: 12/27/1995    Years since quitting: 25.8   Smokeless tobacco: Never  Vaping Use   Vaping Use: Never used  Substance Use Topics   Alcohol use: No   Drug use: No    Review of Systems  Constitutional: Positive for fever/chills.  Positive for generalized weakness and confusion. Eyes: No visual changes. ENT: No sore throat.  Positive for jaw pain. Cardiovascular: Denies chest pain. Respiratory: Denies shortness of breath. Gastrointestinal: No abdominal pain.  Positive for flank pain.  No nausea, no vomiting.  No diarrhea.  No constipation. Genitourinary: Positive for dysuria. Musculoskeletal: Negative for back pain. Skin: Negative for  rash. Neurological: Negative for headaches, focal weakness or numbness.  ____________________________________________   PHYSICAL EXAM:  VITAL SIGNS: ED Triage Vitals [10/14/21 0858]  Enc Vitals Group     BP 133/67     Pulse Rate 83     Resp 18     Temp 97.6 F (36.4 C)     Temp Source Oral     SpO2 94 %     Weight      Height  Head Circumference      Peak Flow      Pain Score      Pain Loc      Pain Edu?      Excl. in [place]?     Constitutional: Alert and oriented. Eyes: Conjunctivae are normal. Head: Atraumatic. Nose: No congestion/rhinnorhea. Ears: TMs obscured by impacted cerumen. Mouth/Throat: Mucous membranes are moist.  Posterior oropharynx clear with no erythema, edema, or exudates.  Patient with dentures in place, no jaw edema or fluctuance noted. Neck: Normal ROM, mildly tender enlarged lymph node noted at right angle of mandible.  No tenderness to palpation at mastoid process. Cardiovascular: Normal rate, regular rhythm. Grossly normal heart sounds. Respiratory: Normal respiratory effort.  No retractions. Lungs CTAB. Gastrointestinal: Soft and nontender.  Right CVA tenderness noted.  No distention. Genitourinary: deferred Musculoskeletal: No lower extremity tenderness nor edema. Neurologic:  Normal speech and language. No gross focal neurologic deficits are appreciated. Skin:  Skin is warm, dry and intact. No rash noted. Psychiatric: Mood and affect are normal. Speech and behavior are normal.  ____________________________________________   LABS (all labs ordered are listed, but only abnormal results are displayed)  Labs Reviewed  LACTIC ACID, PLASMA - Abnormal; Notable for the following components:      Result Value   Lactic Acid, Venous 2.3 (*)    All other components within normal limits  COMPREHENSIVE METABOLIC PANEL - Abnormal; Notable for the following components:   Sodium 134 (*)    Potassium 3.2 (*)    Chloride 96 (*)    Glucose, Bld 177 (*)     AST 109 (*)    ALT 180 (*)    Alkaline Phosphatase 242 (*)    Total Bilirubin 2.1 (*)    All other components within normal limits  CBC WITH DIFFERENTIAL/PLATELET - Abnormal; Notable for the following components:   Platelets 123 (*)    Lymphs Abs 0.6 (*)    All other components within normal limits  URINALYSIS, COMPLETE (UACMP) WITH MICROSCOPIC - Abnormal; Notable for the following components:   Color, Urine AMBER (*)    APPearance HAZY (*)    Protein, ur 30 (*)    All other components within normal limits  RESP PANEL BY RT-PCR (FLU A&B, COVID) ARPGX2  CULTURE, BLOOD (ROUTINE X 2)  CULTURE, BLOOD (ROUTINE X 2)  URINE CULTURE  PROTIME-INR  APTT  PROCALCITONIN  LACTIC ACID, PLASMA  BRAIN NATRIURETIC PEPTIDE   ____________________________________________  EKG  ED ECG REPORT I, Blake Divine, the attending physician, personally viewed and interpreted this ECG.   Date: 10/14/2021  EKG Time: 13:26  Rate: 69  Rhythm: normal sinus rhythm  Axis: Normal  Intervals:none  ST&T Change: None   PROCEDURES  Procedure(s) performed (including Critical Care):  Procedures   ____________________________________________   INITIAL IMPRESSION / ASSESSMENT AND PLAN / ED COURSE      75 year old male with past medical history of hypertension, hyperlipidemia, CAD, CHF, COPD, and prostate cancer who presents to the ED complaining of 4 days of fever, generalized weakness, mild disorientation, and dysuria.  He additionally complains of some swelling and mild discomfort at the angle of his right mandible, appears to have a swollen lymph node in this area but there is no evidence of dental infection, pharyngitis, ear infection, or mastoiditis.  He does endorse dysuria and has right CVA tenderness on exam, suspect fever is due to UTI.  Vital signs and labs are thus far reassuring, but clinical presentation is concerning for  sepsis and we will draw blood cultures and lactate.  UA does not  appear consistent with infection, chest x-ray reviewed by me and shows no infiltrate, edema, or effusion.  Given no obvious source for patient's sepsis, CT of his neck was performed along with CT of his abdomen and pelvis, both of which are negative for acute process.  No obvious source for his sepsis at this time, doubt meningitis as he has no headache or neck stiffness.  Case discussed with hospitalist for admission.      ____________________________________________   FINAL CLINICAL IMPRESSION(S) / ED DIAGNOSES  Final diagnoses:  Sepsis without acute organ dysfunction, due to unspecified organism [facility])     ED Discharge Orders     None        Note:  This document was prepared using Dragon voice recognition software and may include unintentional dictation errors.    Blake Divine, MD 10/14/21 [phone]

## 2021-10-14 NOTE — Sepsis Progress Note (Signed)
Sepsis protocol monitored by eLink 

## 2021-10-14 NOTE — Sepsis Progress Note (Signed)
Sent another secure chat to bedside RN about repeat LA that has been collected but has not resulted

## 2021-10-14 NOTE — Consult Note (Signed)
PHARMACY -  BRIEF ANTIBIOTIC NOTE   Pharmacy has received consult(s) for cefepime and vancomycin from an ED provider.  The patient's profile has been reviewed for ht/wt/allergies/indication/available labs.    One time order(s) placed for cefepime 2g IV and vancomycin 1g  Further antibiotics/pharmacy consults should be ordered by admitting physician if indicated.                       Thank you, Narda Rutherford, PharmD Pharmacy Resident  10/14/2021 1:53 PM

## 2021-10-14 NOTE — ED Triage Notes (Signed)
Pt c/o HA with fever and right ear pain for the past week pt is a/ox4 on arrival.

## 2021-10-15 ENCOUNTER — Encounter
Admission: EM | Disposition: A | Discharge status: Home / Self Care | Payer: Self-pay | Source: Home / Self Care | Attending: Internal Medicine

## 2021-10-15 ENCOUNTER — Inpatient Hospital Stay: Payer: Medicare Other | Admitting: Anesthesiology

## 2021-10-15 ENCOUNTER — Encounter: Payer: Self-pay | Admitting: Internal Medicine

## 2021-10-15 DIAGNOSIS — I110 Hypertensive heart disease with heart failure: Secondary | ICD-10-CM | POA: Diagnosis present

## 2021-10-15 DIAGNOSIS — J449 Chronic obstructive pulmonary disease, unspecified: Secondary | ICD-10-CM | POA: Diagnosis present

## 2021-10-15 DIAGNOSIS — I2510 Atherosclerotic heart disease of native coronary artery without angina pectoris: Secondary | ICD-10-CM | POA: Diagnosis present

## 2021-10-15 DIAGNOSIS — I5032 Chronic diastolic (congestive) heart failure: Secondary | ICD-10-CM | POA: Diagnosis present

## 2021-10-15 DIAGNOSIS — R700 Elevated erythrocyte sedimentation rate: Secondary | ICD-10-CM | POA: Diagnosis not present

## 2021-10-15 DIAGNOSIS — R7989 Other specified abnormal findings of blood chemistry: Secondary | ICD-10-CM | POA: Diagnosis not present

## 2021-10-15 DIAGNOSIS — Z8042 Family history of malignant neoplasm of prostate: Secondary | ICD-10-CM | POA: Diagnosis not present

## 2021-10-15 DIAGNOSIS — Z825 Family history of asthma and other chronic lower respiratory diseases: Secondary | ICD-10-CM | POA: Diagnosis not present

## 2021-10-15 DIAGNOSIS — Z8546 Personal history of malignant neoplasm of prostate: Secondary | ICD-10-CM | POA: Diagnosis not present

## 2021-10-15 DIAGNOSIS — K219 Gastro-esophageal reflux disease without esophagitis: Secondary | ICD-10-CM | POA: Diagnosis present

## 2021-10-15 DIAGNOSIS — E7800 Pure hypercholesterolemia, unspecified: Secondary | ICD-10-CM | POA: Diagnosis present

## 2021-10-15 DIAGNOSIS — E876 Hypokalemia: Secondary | ICD-10-CM | POA: Diagnosis present

## 2021-10-15 DIAGNOSIS — R7982 Elevated C-reactive protein (CRP): Secondary | ICD-10-CM | POA: Diagnosis not present

## 2021-10-15 DIAGNOSIS — Z87891 Personal history of nicotine dependence: Secondary | ICD-10-CM | POA: Diagnosis not present

## 2021-10-15 DIAGNOSIS — G9341 Metabolic encephalopathy: Secondary | ICD-10-CM | POA: Diagnosis present

## 2021-10-15 DIAGNOSIS — G629 Polyneuropathy, unspecified: Secondary | ICD-10-CM | POA: Diagnosis present

## 2021-10-15 DIAGNOSIS — Z83438 Family history of other disorder of lipoprotein metabolism and other lipidemia: Secondary | ICD-10-CM | POA: Diagnosis not present

## 2021-10-15 DIAGNOSIS — Z87442 Personal history of urinary calculi: Secondary | ICD-10-CM | POA: Diagnosis not present

## 2021-10-15 DIAGNOSIS — R509 Fever, unspecified: Secondary | ICD-10-CM | POA: Diagnosis present

## 2021-10-15 DIAGNOSIS — G4733 Obstructive sleep apnea (adult) (pediatric): Secondary | ICD-10-CM | POA: Diagnosis present

## 2021-10-15 DIAGNOSIS — Z20822 Contact with and (suspected) exposure to covid-19: Secondary | ICD-10-CM | POA: Diagnosis present

## 2021-10-15 DIAGNOSIS — R945 Abnormal results of liver function studies: Secondary | ICD-10-CM | POA: Diagnosis not present

## 2021-10-15 DIAGNOSIS — I252 Old myocardial infarction: Secondary | ICD-10-CM | POA: Diagnosis not present

## 2021-10-15 DIAGNOSIS — I776 Arteritis, unspecified: Secondary | ICD-10-CM | POA: Diagnosis not present

## 2021-10-15 DIAGNOSIS — Z85828 Personal history of other malignant neoplasm of skin: Secondary | ICD-10-CM | POA: Diagnosis not present

## 2021-10-15 DIAGNOSIS — F418 Other specified anxiety disorders: Secondary | ICD-10-CM | POA: Diagnosis present

## 2021-10-15 DIAGNOSIS — I10 Essential (primary) hypertension: Secondary | ICD-10-CM | POA: Diagnosis not present

## 2021-10-15 DIAGNOSIS — N400 Enlarged prostate without lower urinary tract symptoms: Secondary | ICD-10-CM | POA: Diagnosis present

## 2021-10-15 DIAGNOSIS — K760 Fatty (change of) liver, not elsewhere classified: Secondary | ICD-10-CM | POA: Diagnosis present

## 2021-10-15 DIAGNOSIS — G8929 Other chronic pain: Secondary | ICD-10-CM | POA: Diagnosis present

## 2021-10-15 HISTORY — PX: ARTERY BIOPSY: SHX891

## 2021-10-15 LAB — COMPREHENSIVE METABOLIC PANEL WITH GFR
ALT: 139 U/L — ABNORMAL HIGH (ref 0–44)
AST: 72 U/L — ABNORMAL HIGH (ref 15–41)
Albumin: 3.2 g/dL — ABNORMAL LOW (ref 3.5–5.0)
Alkaline Phosphatase: 234 U/L — ABNORMAL HIGH (ref 38–126)
Anion gap: 7 (ref 5–15)
BUN: 10 mg/dL (ref 8–23)
CO2: 31 mmol/L (ref 22–32)
Calcium: 9.1 mg/dL (ref 8.9–10.3)
Chloride: 102 mmol/L (ref 98–111)
Creatinine, Ser: 0.94 mg/dL (ref 0.61–1.24)
GFR, Estimated: >60 mL/min
Glucose, Bld: 173 mg/dL — ABNORMAL HIGH (ref 70–99)
Potassium: 4.3 mmol/L (ref 3.5–5.1)
Sodium: 140 mmol/L (ref 135–145)
Total Bilirubin: 1.5 mg/dL — ABNORMAL HIGH (ref 0.3–1.2)
Total Protein: 7.1 g/dL (ref 6.5–8.1)

## 2021-10-15 LAB — CBC
HCT: 36.3 % — ABNORMAL LOW (ref 39.0–52.0)
Hemoglobin: 12.5 g/dL — ABNORMAL LOW (ref 13.0–17.0)
MCH: 31.2 pg (ref 26.0–34.0)
MCHC: 34.4 g/dL (ref 30.0–36.0)
MCV: 90.5 fL (ref 80.0–100.0)
Platelets: 127 10*3/uL — ABNORMAL LOW (ref 150–400)
RBC: 4.01 MIL/uL — ABNORMAL LOW (ref 4.22–5.81)
RDW: 12.5 % (ref 11.5–15.5)
WBC: 3.9 10*3/uL — ABNORMAL LOW (ref 4.0–10.5)
nRBC: 0 % (ref 0.0–0.2)

## 2021-10-15 LAB — URINE CULTURE: Culture: NO GROWTH

## 2021-10-15 LAB — GLUCOSE, CAPILLARY: Glucose-Capillary: 144 mg/dL — ABNORMAL HIGH (ref 70–99)

## 2021-10-15 SURGERY — BIOPSY TEMPORAL ARTERY
Anesthesia: General | Site: Head | Laterality: Right

## 2021-10-15 MED ORDER — FENTANYL CITRATE (PF) 100 MCG/2ML IJ SOLN
INTRAMUSCULAR | Status: AC
  Filled 2021-10-15: qty 2

## 2021-10-15 MED ORDER — PROPOFOL 10 MG/ML IV BOLUS
INTRAVENOUS | Status: AC
  Filled 2021-10-15: qty 20

## 2021-10-15 MED ORDER — ONDANSETRON HCL 4 MG/2ML IJ SOLN
INTRAMUSCULAR | Status: AC
  Filled 2021-10-15: qty 2

## 2021-10-15 MED ORDER — LACTATED RINGERS IV SOLN: INTRAVENOUS | Status: DC

## 2021-10-15 MED ORDER — AMLODIPINE BESYLATE 5 MG PO TABS
2.5000 mg | ORAL_TABLET | Freq: Every day | ORAL | Status: DC
  Administered 2021-10-15: 2.5 mg via ORAL
  Filled 2021-10-15: qty 1

## 2021-10-15 MED ORDER — LIDOCAINE HCL (PF) 1 % IJ SOLN
INTRAMUSCULAR | Status: AC
  Filled 2021-10-15: qty 30

## 2021-10-15 MED ORDER — LIDOCAINE HCL 1 % IJ SOLN
INTRAMUSCULAR | Status: DC | PRN
  Administered 2021-10-15: 4 mL

## 2021-10-15 MED ORDER — SODIUM CHLORIDE (PF) 0.9 % IJ SOLN
INTRAMUSCULAR | Status: AC
  Filled 2021-10-15: qty 20

## 2021-10-15 MED ORDER — DEXAMETHASONE SODIUM PHOSPHATE 10 MG/ML IJ SOLN
INTRAMUSCULAR | Status: DC | PRN
  Administered 2021-10-15: 5 mg via INTRAVENOUS

## 2021-10-15 MED ORDER — CEFAZOLIN SODIUM-DEXTROSE 2-3 GM-%(50ML) IV SOLR
INTRAVENOUS | Status: DC | PRN
  Administered 2021-10-15: 2 g via INTRAVENOUS

## 2021-10-15 MED ORDER — FENTANYL CITRATE (PF) 100 MCG/2ML IJ SOLN
25.0000 ug | Freq: Once | INTRAMUSCULAR | Status: AC
  Administered 2021-10-15: 25 ug via INTRAVENOUS

## 2021-10-15 MED ORDER — ONDANSETRON HCL 4 MG/2ML IJ SOLN: 4.0000 mg | Freq: Once | INTRAMUSCULAR | Status: DC | PRN

## 2021-10-15 MED ORDER — 0.9 % SODIUM CHLORIDE (POUR BTL) OPTIME
TOPICAL | Status: DC | PRN
  Administered 2021-10-15: 100 mL

## 2021-10-15 MED ORDER — PHENYLEPHRINE HCL-NACL 20-0.9 MG/250ML-% IV SOLN
INTRAVENOUS | Status: AC
  Filled 2021-10-15: qty 250

## 2021-10-15 MED ORDER — FENTANYL CITRATE (PF) 100 MCG/2ML IJ SOLN
INTRAMUSCULAR | Status: DC | PRN
  Administered 2021-10-15: 25 ug via INTRAVENOUS

## 2021-10-15 MED ORDER — FENTANYL CITRATE (PF) 100 MCG/2ML IJ SOLN: 25.0000 ug | INTRAMUSCULAR | Status: DC | PRN

## 2021-10-15 MED ORDER — ONDANSETRON HCL 4 MG/2ML IJ SOLN
INTRAMUSCULAR | Status: DC | PRN
  Administered 2021-10-15: 4 mg via INTRAVENOUS

## 2021-10-15 MED ORDER — SEVOFLURANE IN SOLN
RESPIRATORY_TRACT | Status: AC
  Filled 2021-10-15: qty 250

## 2021-10-15 MED ORDER — CEFAZOLIN SODIUM 1 G IJ SOLR
INTRAMUSCULAR | Status: AC
  Filled 2021-10-15: qty 20

## 2021-10-15 MED ORDER — LIDOCAINE HCL (CARDIAC) PF 100 MG/5ML IV SOSY
PREFILLED_SYRINGE | INTRAVENOUS | Status: DC | PRN
  Administered 2021-10-15: 100 mg via INTRAVENOUS

## 2021-10-15 MED ORDER — DEXMEDETOMIDINE (PRECEDEX) IN NS 20 MCG/5ML (4 MCG/ML) IV SYRINGE
PREFILLED_SYRINGE | INTRAVENOUS | Status: AC
  Filled 2021-10-15: qty 5

## 2021-10-15 MED ORDER — DEXMEDETOMIDINE (PRECEDEX) IN NS 20 MCG/5ML (4 MCG/ML) IV SYRINGE
PREFILLED_SYRINGE | INTRAVENOUS | Status: DC | PRN
  Administered 2021-10-15: 8 ug via INTRAVENOUS

## 2021-10-15 MED ORDER — PROPOFOL 10 MG/ML IV BOLUS
INTRAVENOUS | Status: DC | PRN
  Administered 2021-10-15: 180 mg via INTRAVENOUS
  Administered 2021-10-15: 20 mg via INTRAVENOUS

## 2021-10-15 MED ORDER — DEXAMETHASONE SODIUM PHOSPHATE 10 MG/ML IJ SOLN
INTRAMUSCULAR | Status: AC
  Filled 2021-10-15: qty 1

## 2021-10-15 SURGICAL SUPPLY — 41 items
BLADE SURG 15 STRL LF DISP TIS (BLADE) ×1
BLADE SURG 15 STRL SS (BLADE) ×2
BLADE SURG SZ11 CARB STEEL (BLADE) ×2
CNTNR SPEC 2.5X3XGRAD LEK (MISCELLANEOUS)
CONT SPEC 4OZ STER OR WHT (MISCELLANEOUS)
CONT SPEC 4OZ STRL OR WHT (MISCELLANEOUS)
COTTON BALL STRL MEDIUM (GAUZE/BANDAGES/DRESSINGS) ×2
DERMABOND ADVANCED (GAUZE/BANDAGES/DRESSINGS) ×1
DERMABOND ADVANCED .7 DNX12 (GAUZE/BANDAGES/DRESSINGS) ×1
DRAPE LAPAROTOMY 77X122 PED (DRAPES) ×2
DRSG TELFA 4X3 1S NADH ST (GAUZE/BANDAGES/DRESSINGS) ×2
ELECT CAUTERY BLADE TIP 2.5 (TIP) ×2
ELECT CAUTERY NEEDLE 2.0 MIC (NEEDLE) ×2
ELECT REM PT RETURN 9FT ADLT (ELECTROSURGICAL) ×2
GAUZE 4X4 16PLY ~~LOC~~+RFID DBL (SPONGE) ×2
GLOVE SURG SYN 6.5 ES PF (GLOVE) ×2 IMPLANT
GLOVE SURG UNDER POLY LF SZ7 (GLOVE) ×2
GOWN STRL REUS W/ TWL LRG LVL3 (GOWN DISPOSABLE) ×2
GOWN STRL REUS W/TWL LRG LVL3 (GOWN DISPOSABLE) ×4
KIT TURNOVER KIT A (KITS) ×2
LABEL OR SOLS (LABEL) ×2
MANIFOLD NEPTUNE II (INSTRUMENTS) ×2
NEEDLE HYPO 25X1 1.5 SAFETY (NEEDLE)
NS IRRIG 500ML POUR BTL (IV SOLUTION) ×2
PACK BASIN MINOR ARMC (MISCELLANEOUS) ×2
SOL PREP PVP 2OZ (MISCELLANEOUS) ×2
SUCTION FRAZIER HANDLE 10FR (MISCELLANEOUS) ×2
SUCTION TUBE FRAZIER 10FR DISP (MISCELLANEOUS) ×1
SUT MNCRL AB 4-0 PS2 18 (SUTURE) ×2
SUT SILK 2 0 (SUTURE)
SUT SILK 2-0 18XBRD TIE 12 (SUTURE)
SUT SILK 3 0 (SUTURE) ×2
SUT SILK 3-0 18XBRD TIE 12 (SUTURE) ×1
SUT SILK 4 0 (SUTURE)
SUT SILK 4-0 18XBRD TIE 12 (SUTURE)
SUT VIC AB 3-0 SH 27 (SUTURE)
SUT VIC AB 3-0 SH 27X BRD (SUTURE)
SUT VICRYL 3-0 27IN SH (SUTURE) ×2
SYR 10ML LL (SYRINGE)
SYR BULB IRRIG 60ML STRL (SYRINGE) ×2
WATER STERILE IRR 500ML POUR (IV SOLUTION) ×2

## 2021-10-15 NOTE — Discharge Instructions (Signed)
Removal, Care After This sheet gives you information about how to care for yourself after your procedure. Your health care provider may also give you more specific instructions. If you have problems or questions, contact your health care provider. What can I expect after the procedure? After the procedure, it is common to have: Soreness. Bruising. Itching. Follow these instructions at home: site care Follow instructions from your health care provider about how to take care of your site. Make sure you: Wash your hands with soap and water before and after you change your bandage (dressing). If soap and water are not available, use hand sanitizer. Leave stitches (sutures), skin glue, or adhesive strips in place. These skin closures may need to stay in place for 2 weeks or longer. If adhesive strip edges start to loosen and curl up, you may trim the loose edges. Do not remove adhesive strips completely unless your health care provider tells you to do that. If the area bleeds or bruises, apply gentle pressure for 10 minutes. OK TO SHOWER IN 24HRS  Check your site every day for signs of infection. Check for: Redness, swelling, or pain. Fluid or blood. Warmth. Pus or a bad smell.  General instructions Rest and then return to your normal activities as told by your health care provider.  tylenol and advil as needed for discomfort.  Please alternate between the two every four hours as needed for pain.    Use narcotics, if prescribed, only when tylenol and motrin is not enough to control pain.  325-650mg every 8hrs to max of 3000mg/24hrs (including the 325mg in every norco dose) for the tylenol.    Advil up to 800mg per dose every 8hrs as needed for pain.   Keep all follow-up visits as told by your health care provider. This is important. Contact a health care provider if: You have redness, swelling, or pain around your site. You have fluid or blood coming from your site. Your site feels warm to  the touch. You have pus or a bad smell coming from your site. You have a fever. Your sutures, skin glue, or adhesive strips loosen or come off sooner than expected. Get help right away if: You have bleeding that does not stop with pressure or a dressing. Summary After the procedure, it is common to have some soreness, bruising, and itching at the site. Follow instructions from your health care provider about how to take care of your site. Check your site every day for signs of infection. Contact a health care provider if you have redness, swelling, or pain around your site, or your site feels warm to the touch. Keep all follow-up visits as told by your health care provider. This is important. This information is not intended to replace advice given to you by your health care provider. Make sure you discuss any questions you have with your health care provider. Document Released: 01/08/2016 Document Revised: 06/11/2018 Document Reviewed: 06/11/2018 Elsevier Interactive Patient Education  2019 Elsevier Inc[employer]   

## 2021-10-15 NOTE — Anesthesia Procedure Notes (Signed)
Procedure Name: LMA Insertion Date/Time: 10/15/2021 4:55 PM Performed by: Doreen Salvage, CRNA Pre-anesthesia Checklist: Patient identified, Patient being monitored, Timeout performed, Emergency Drugs available and Suction available Patient Re-evaluated:Patient Re-evaluated prior to induction Oxygen Delivery Method: Circle system utilized Preoxygenation: Pre-oxygenation with 100% oxygen Induction Type: IV induction Ventilation: Mask ventilation without difficulty LMA: LMA inserted Tube type: Oral Tube size: 4.0 mm Number of attempts: 1 Placement Confirmation: positive ETCO2 and breath sounds checked- equal and bilateral Tube secured with: Tape Dental Injury: Teeth and Oropharynx as per pre-operative assessment

## 2021-10-15 NOTE — Transfer of Care (Signed)
Immediate Anesthesia Transfer of Care Note  Patient: Albert Wade  Procedure(s) Performed: BIOPSY TEMPORAL ARTERY (Right: Head)  Patient Location: PACU  Anesthesia Type:General  Level of Consciousness: awake  Airway & Oxygen Therapy: Patient Spontanous Breathing and Patient connected to face mask oxygen  Post-op Assessment: Report given to RN and Post -op Vital signs reviewed and stable  Post vital signs: Reviewed and stable  Last Vitals:  Vitals Value Taken Time  BP 163/77   Temp    Pulse 70 10/15/21 1851  Resp 13 10/15/21 1851  SpO2 100 % 10/15/21 1851  Vitals shown include unvalidated device data.  Last Pain:  Vitals:   10/15/21 1550  TempSrc:   PainSc: 0-No pain      Patients Stated Pain Goal: 0 (16/10/96 0454)  Complications: No notable events documented.

## 2021-10-15 NOTE — Consult Note (Signed)
Intraoperative consultation from Dr. Nicole Kindred.  Dr. Nicole Kindred requested vascular surgery assistance for identification and exploration of right temporal artery biopsy.  The patient was in OR for and upon entering the operating room the patient was in the supine position in a semi-Fowler presentation.  An incision was already made in the right temporal artery area.  Dr. Laurann Montana had a slight difficult time finding the temporal artery, however he was reoriented and found the artery.  I scrubbed in to give him some assistance and he took a part of the temporal artery out for biopsy after ligating it proximally and distally.  No further assistance was required and Dr. Laurann Montana completed the surgery in satisfactory condition.

## 2021-10-15 NOTE — Op Note (Signed)
Pre-Op Dx: RIGHT temporal pain and elevated ESR Post-Op Dx: same Anesthesia: LMA EBL: minimal Complications:  none apparent Specimen: right temporal artery Procedure: excisional biopsy of right temporal artery Surgeon: Lysle Pearl  Indication for procedure: Patient presenting with symptoms above concerning for possible temporal arteritis surgery requested for biopsy to aid in diagnosis.  Description of Procedure:  Patient willing to the OR, transferred to the OR table in supine position.  Preoperative antibiotics given.  Area sterilized and draped in usual position.  Timeout performed.   Local infused to area previously marked after confirming palpable temporal artery as well as using Doppler.  6cm incision made through dermis with 15blade and the palpable temporal artery was noted overlying the fascia and muscle.    The 2 ends were then clamped and then ligated and the specimen was passed off the operative field pending pathology.  2 ends were then suture ligated with 3-0 silk.  Prior to the removal of the biopsy specimen, any branching arteries and overlying vein was also suture-ligated with 3-0 silk.  Wound hemostasis noted, then skin closed with 3-0 vicryl for deep dermal layer and running 4-0 monocryl in subcuticular fashion.  Wound then dressed with dermabond.  Pt tolerated procedure well, and transferred to PACU in stable condition. Sponge and instrument count correct at end of procedure.

## 2021-10-15 NOTE — Anesthesia Preprocedure Evaluation (Signed)
Anesthesia Evaluation  Patient identified by [name], date of birth, ID band Patient awake    Reviewed: Allergy & Precautions, H&P , NPO status , Patient's Chart, lab work & pertinent test results, reviewed documented beta blocker date and time   Airway Mallampati: III  TM Distance: >3 FB Neck ROM: full    Dental  (+) Edentulous Upper, Edentulous Lower   Pulmonary shortness of breath and with exertion, sleep apnea , pneumonia, resolved, COPD,  COPD inhaler, former smoker,    Pulmonary exam normal        Cardiovascular Exercise Tolerance: Poor hypertension, + angina with exertion + CAD, + Past MI and +CHF  Normal cardiovascular exam+ Valvular Problems/Murmurs  Rate:Normal     Neuro/Psych PSYCHIATRIC DISORDERS Depression  Neuromuscular disease    GI/Hepatic Neg liver ROS, hiatal hernia, GERD  ,  Endo/Other  negative endocrine ROS  Renal/GU negative Renal ROS  negative genitourinary   Musculoskeletal   Abdominal   Peds  Hematology negative hematology ROS (+)   Anesthesia Other Findings   Reproductive/Obstetrics negative OB ROS                             Anesthesia Physical Anesthesia Plan  ASA: 3  Anesthesia Plan: General LMA   Post-op Pain Management:    Induction:   PONV Risk Score and Plan: 3  Airway Management Planned:   Additional Equipment:   Intra-op Plan:   Post-operative Plan:   Informed Consent: I have reviewed the patients History and Physical, chart, labs and discussed the procedure including the risks, benefits and alternatives for the proposed anesthesia with the patient or authorized representative who has indicated his/her understanding and acceptance.       Plan Discussed with: CRNA  Anesthesia Plan Comments:         Anesthesia Quick Evaluation

## 2021-10-15 NOTE — Consult Note (Signed)
Subjective:   CC: Right-sided temporal headache and fever, elevated ESR  HPI:  Albert Wade is a 75 y.o. male who was consulted by [facility] for evaluation of above.  First noted a few days ago.  Symptoms include: Pain is dull, localized to right temporal area and below right ear.  Exacerbated by nothing specific.  Alleviated by steroids and pain meds.  Associated with fever of unknown origin.  Admitted for above symptoms and work-up has been negative so far except for a elevated ESR.  Due to the location of the headache steroids were empirically started for concern for arteritis.  Surgery consulted for temporal artery biopsy.   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), Confusion (04/27/2017), COPD (chronic obstructive pulmonary disease) (HCC), Depression, Encounter for preventative adult health care exam with abnormal findings (12/20/2013), Fever (04/27/2017), GERD (gastroesophageal reflux disease), Gynecomastia (05/12/2015), H/O hiatal hernia, Heart murmur, History of kidney stones, HTN (hypertension), Hypercholesteremia, Hyperglycemia (05/19/2016), HYPERLIPIDEMIA (04/29/2008), Impaired glucose tolerance (12/20/2013), Insomnia (04/27/2017), Lumbosacral radiculopathy (10/14/2016), MI (myocardial infarction) (HCC), MYOCARDIAL INFARCTION (04/29/2008), OSA (obstructive sleep apnea) (05/13/2011), Peripheral neuropathy, Phimosis/adherent prepuce (10/21/2013), Pneumonia, Prostate cancer (HCC), Shortness of breath (11/05/2010), Sleep apnea, Squamous cell carcinoma of skin (10/29/2020), Umbilical hernia, Umbilical hernia without obstruction and without gangrene (05/19/2016), URI (upper respiratory  infection) (04/27/2017), UTI (urinary tract infection) (12/01/2016), Varicose veins, Varicose veins of bilateral lower extremities with other complications (10/14/2016), and Wheezing (01/01/2015).  Past Surgical History:  has a past surgical history that includes Appendectomy; Angioplasty; Cardiac catheterization; Wrist surgery (Right); Back surgery; Circumcision (N/A, 10/21/2013); Cystoscopy (N/A, 10/21/2013); Umbilical hernia repair (N/A, 06/17/2016); Insertion of mesh (N/A, 06/17/2016); Cystoscopy/ureteroscopy/holmium laser/stent placement (Right, 01/06/2020); Cystoscopy with biopsy (Right, 01/06/2020); Hernia repair; Cystoscopy w/ ureteral stent placement (Right, 06/01/2020); Esophagogastroduodenoscopy (N/A, 02/03/2021); Esophagogastroduodenoscopy (egd) with propofol (N/A, 03/09/2021); and Colonoscopy with propofol (N/A, 06/09/2021).  Family History: family history includes Emphysema in his father; Hyperlipidemia in his father and mother; Prostate cancer in his father.  Social History:  reports that he quit smoking about 25 years ago. His smoking use included cigarettes. He has a 20.00 pack-year smoking history. He has never used smokeless tobacco. He reports that he does not drink alcohol and does not use drugs.  Current Medications:  Prior to Admission medications   Medication Sig Start Date End Date Taking? Authorizing Provider  albuterol (VENTOLIN HFA) 108 (90 Base) MCG/ACT inhaler INHALE 2 PUFFS INTO THE LUNGS 4 TIMES DAILY AS NEEDED 10/19/20  Yes John, James W, MD  ALPRAZolam (XANAX) 0.5 MG tablet Take 0.5 mg by mouth 2 (two) times daily as needed for anxiety. 01/28/21  Yes [provider]  amLODipine (NORVASC) 2.5 MG tablet Take 1 tablet (2.5 mg total) by mouth daily. 09/16/21  Yes Ross, Paula V, MD  aspirin 81 MG tablet Take 81 mg by mouth daily.   Yes [provider]  DULoxetine (CYMBALTA) 60 MG capsule Take 60 mg by mouth 2 (two) times daily.  04/23/20  Yes [provider]   fluticasone (FLOVENT HFA) 44 MCG/ACT inhaler Inhale 2 puffs into the lungs 2 (two) times daily as needed. 09/17/21  Yes Ross, Paula V, MD  furosemide (LASIX) 40 MG tablet Take 1 tablet (40 mg total) by mouth 2 (two) times daily. 07/21/21  Yes   Biagio Borg, MD  omeprazole (PRILOSEC) 20 MG capsule Take 1 capsule (20 mg total) by mouth 2 (two) times daily. 04/08/21 04/08/22 Yes Jonathon Bellows, MD  oxyCODONE-acetaminophen (PERCOCET) 10-325 MG tablet Take 1 tablet by mouth every 4 (four) hours as needed for pain. Patient taking differently: Take 1-2 tablets by mouth every 4 (four) hours as needed for pain. Max 8 per day 01/06/20  Yes Hollice Espy, MD  potassium chloride (KLOR-CON) 10 MEQ tablet Take 2 tablets (20 mEq total) by mouth daily. 09/20/21  Yes Fay Records, MD  simvastatin (ZOCOR) 40 MG tablet Take 1 tablet (40 mg total) by mouth at bedtime. 09/20/21  Yes Fay Records, MD  Eszopiclone 3 MG TABS Take 3 mg by mouth at bedtime. Take immediately before bedtime Patient not taking: Reported on 10/14/2021    [provider]    Allergies:  Allergies  Allergen Reactions   Cyclobenzaprine     agitation    Seroquel [Quetiapine] Hives    ROS:  General: Denies weight loss, weight gain, fatigue, fevers, chills, and night sweats. Eyes: Denies blurry vision, double vision, eye pain, itchy eyes, and tearing. Ears: Denies hearing loss, earache, and ringing in ears. Nose: Denies sinus pain, congestion, infections, runny nose, and nosebleeds. Mouth/throat: Denies hoarseness, sore throat, bleeding gums, and difficulty swallowing. Heart: Denies chest pain, palpitations, racing heart, irregular heartbeat, leg pain or swelling, and decreased activity tolerance. Respiratory: Denies breathing difficulty, shortness of breath, wheezing, cough, and sputum. GI: Denies change in appetite, heartburn, nausea, vomiting, constipation, diarrhea, and blood in stool. GU: Denies difficulty urinating, pain with  urinating, urgency, frequency, blood in urine. Musculoskeletal: Denies joint stiffness, pain, swelling, muscle weakness. Skin: Denies rash, itching, mass, tumors, sores, and boils Neurologic: Denies headache, fainting, dizziness, seizures, numbness, and tingling. Psychiatric: Denies depression, anxiety, difficulty sleeping, and memory loss. Endocrine: Denies heat or cold intolerance, and increased thirst or urination. Blood/lymph: Denies easy bruising, easy bruising, and swollen glands     Objective:     BP (!) 153/76 (BP Location: Right Arm)   Pulse 63   Temp (!) 97.4 F (36.3 C) (Oral)   Resp 12   Ht 5' 10.51" (1.791 m)   Wt 89.1 kg   SpO2 100%   BMI 27.78 kg/m   Constitutional :  alert, cooperative, appears stated age, and no distress  Lymphatics/Throat:  supple without significant adenopathy  Respiratory:  clear to auscultation bilaterally  Cardiovascular:  regular rate and rhythm  Gastrointestinal: soft, non-tender; bowel sounds normal; no masses,  no organomegaly.  Musculoskeletal: Steady gait and movement  Skin: Cool and moist  Psychiatric: Normal affect, non-agitated, not confused       LABS:  CMP Latest Ref Rng & Units 10/15/2021 10/14/2021 09/16/2021  Glucose 70 - 99 mg/dL 173(H) 177(H) 127(H)  BUN 8 - 23 mg/dL _0 Creatinine 0.61 - 1.24 mg/dL 0.94 1.14 0.90  Sodium 135 - 145 mmol/L 140 134(L) 143  Potassium 3.5 - 5.1 mmol/L 4.3 3.2(L) 3.3(L)  Chloride 98 - 111 mmol/L 102 96(L) 100  CO2 22 - 32 mmol/L _1 Calcium 8.9 - 10.3 mg/dL 9.1 9.1 8.8  Total Protein 6.5 - 8.1 g/dL 7.1 7.4 -  Total Bilirubin 0.3 - 1.2 mg/dL 1.5(H) 2.1(H) -  Alkaline Phos 38 - 126 U/L 234(H) 242(H) -  AST 15 - 41 U/L 72(H) 109(H) -  ALT 0 - 44 U/L 139(H) 180(H) -   CBC Latest Ref Rng &  Units 10/15/2021 10/14/2021 07/21/2021  WBC 4.0 - 10.5 K/uL 3.9(L) 5.4 6.4  Hemoglobin 13.0 - 17.0 g/dL 12.5(L) 13.6 12.3(L)  Hematocrit 39.0 - 52.0 % 36.3(L) 39.6 35.6(L)  Platelets 150 -  400 K/uL 127(L) 123(L) 178.0  SED RATE: 71   RADS: Narrative & Impression  CLINICAL DATA:  Rule out neck abscess. Headache and fever. Right ear pain.   EXAM: CT NECK WITH CONTRAST   TECHNIQUE: Multidetector CT imaging of the neck was performed using the standard protocol following the bolus administration of intravenous contrast.   CONTRAST:  100mL OMNIPAQUE IOHEXOL 300 MG/ML  SOLN   COMPARISON:  None.   FINDINGS: Pharynx and larynx: Normal. No mass or swelling.   Salivary glands: No inflammation, mass, or stone. Incidental lipoma posterior to the submandibular gland on the right.   Thyroid: Small thyroid.  No focal lesion.   Lymph nodes: Negative for enlarged or pathologic lymph nodes in the neck.   Vascular: Normal arterial enhancement. Atherosclerotic disease in the carotid bifurcation bilaterally.   Limited intracranial: Negative   Visualized orbits: Negative   Mastoids and visualized paranasal sinuses: Paranasal sinuses clear. Mastoid clear bilaterally.   Skeleton: Cervical spondylosis.  No acute skeletal abnormality.   Upper chest: Lung apices clear bilaterally.  No acute abnormality   Other: None   IMPRESSION: Negative for pharyngeal mass or abscess. No acute abnormality in the neck.     Electronically Signed   By: Charles  Clark M.D.   On: 10/14/2021 15:42    Assessment:      Right-sided temporal headache and fever, elevated ESR  Plan:     1. Alternatives include continued observation and steroid course completion.  Benefits include pathologic evaluation. Discussed the risk of surgery including recurrence, chronic pain, post-op infxn, poor cosmesis, poor/delayed wound healing, and possible re-operation to address said risks. The risks of general anesthetic, if used, includes MI, CVA, sudden death or even reaction to anesthetic medications also discussed.  Typical post-op recovery time of 3-5 days with possible activity restrictions were also  discussed.  The patient verbalized understanding and all questions were answered to the patient's satisfaction.  We will proceed with the biopsy, understanding he is slight increased risk of bleeding issues secondary to aspirin and heparin use.  We did discuss the low yield of these biopsy results but the alternatives of delayed diagnosis potentially causing complications including prolonged use of unnecessary steroids.  Patient and wife both verbalized understanding and still wishes to proceed.    

## 2021-10-15 NOTE — Progress Notes (Signed)
PROGRESS NOTE    Albert Wade   BVQ:[id number]  DOB: 1946/03/07  PCP: Biagio Borg, MD    DOA: 10/14/2021 LOS: 0    Brief Narrative / Hospital Course to Date:   Albert Wade is a 75 y.o. male with medical history significant of hypertension, hyperlipidemia, COPD, GERD, depression with anxiety, varicose vein, prostate cancer, BPH, OSA, CAD, myocardial infarction, dCHF, who presented to the ED on 10/15/2021 with fevers up to 103.4 at home.  He also reported right-sided jaw and temporal area pain.    He was admitted and started on broad spectrum antibiotics empirically.  IV steroids were started and inflammatory markers ordered, with consideration for giant cell arteritis.  No source of infection was identified.  ESR and CPR both markedly elevated.  Patient later reported recently having some transient vision changes in the right eye.  He also reported a remote history of right-sided cluster headaches, although many years ago.   General surgery was consulted to obtain temporal artery biopsy.  Patient remains on empiric IV steroids.    Monitoring for signs of infection or recurrent fevers of antibiotics given no infection source was found.   Assessment & Plan   Principal Problem:   Fever Active Problems:   HLD (hyperlipidemia)   Depression with anxiety   Abnormal LFTs   Chronic diastolic CHF (congestive heart failure) (HCC)   COPD (chronic obstructive pulmonary disease) (HCC)   CAD (coronary artery disease)   Acute metabolic encephalopathy   Hypokalemia   HTN (hypertension)   Elevated lactic acid level   Fevers - unclear source, no infection identified. Suspect Giant Cell Arteritis / Temporal Arteritis given unexplained fevers, symptoms reported as above and elevated inflammatory markers. --stop antibiotics and monitor --continue IV steroids --general surgery consulted for TA biopsy --follow up temporal artery biopsy --will continue on empiric steroids while biopsy  pending  --refer to rheumatology as outpatient  Abnormal liver function - RUQ U/S showed hepatic steatosis.  Hepatitis panel negative.  Normal Tylenol level. --Monitor LFT's   Elevated lactic acid level - Lactic acid 2.3, repeat normal after IV fluids.  Did not meet sepsis criteria.   Suspect mild dehydration vs related to fevers.     Hyperlipidemia - Zocor   Depression with anxiety - continue home medications   Chronic diastolic CHF - compensated, overall euvolemic. 2D echo on 08/10/2021 showed EF of 55 to 60% with grade 1 diastolic dysfunction.   --Lasix held on admission due to concern for developing sepsis --Monitor volume status, likely resume Lasix tomorrow if clinically stably off antibiotics.    COPD- Stable, No wheezing or rhonchi to suggest exacerbation. --Bronchodilators   CAD- No chest pain, stable. -- continue ASA and Zocor   Acute metabolic encephalopathy resolved. Patient had disorientation at home when patient had a fever.  Now alert and oriented x3.  No confusion. --Monitor --Delirium precautions   Hypokalemia - K 3.2 on admission, replaced.   Monitor and replace as needed.    Hypertension - Blood pressure 196/86 on arrival, improved to 137/68.  Resume home amlodipine.  PRN hydralazine.     Patient BMI: Body mass index is 27.78 kg/m.   DVT prophylaxis: heparin injection 5,000 Units Start: 10/14/21 2200   Diet:  Diet Orders (From admission, onward)     Start     Ordered   10/14/21 1852  Diet Heart Room service appropriate? Yes; Fluid consistency: Thin  Diet effective now       Question Answer  Comment  Room service appropriate? Yes   Fluid consistency: Thin      10/14/21 1852              Code Status: Full Code   Subjective 10/15/21    Pt seen sitting up in bed.  Reports feeling fine.  No recurrent fevers.  Reports some right eye vision changes intermittently along with his temporal and jaw pain that he's noticed along with the fevers.   Denies any other symptoms.  Hopes he can go home soon.  Reports remote hx of severe cluster headaches on right side, but not in years.    Disposition Plan & Communication   Status is: Inpatient  Remains inpatient appropriate because: ongoing diagnostic evaluation not appropriate for outpatient. Concern for GCA/TA as above, biopsy pending and requires IV therapies.  Monitoring off antibiotics.     Consults, Procedures, Significant Events   Consultants:  General surgery  Procedures:  Temporal Artery Biopsy this aftenroon planned  Antimicrobials:  Anti-infectives (From admission, onward)    Start     Dose/Rate Route Frequency Ordered Stop   10/15/21 1900  vancomycin (VANCOREADY) IVPB 1750 mg/350 mL        1,750 mg 175 mL/hr over 120 Minutes Intravenous Every 24 hours 10/14/21 1905     10/14/21 1900  vancomycin (VANCOCIN) IVPB 1000 mg/200 mL premix        1,000 mg 200 mL/hr over 60 Minutes Intravenous  Once 10/14/21 1847 10/14/21 2216   10/14/21 1900  piperacillin-tazobactam (ZOSYN) IVPB 3.375 g        3.375 g 12.5 mL/hr over 240 Minutes Intravenous Every 8 hours 10/14/21 1851     10/14/21 1645  ceFEPIme (MAXIPIME) 2 g in sodium chloride 0.9 % 100 mL IVPB  Status:  Discontinued        2 g 200 mL/hr over 30 Minutes Intravenous  Once 10/14/21 1638 10/14/21 1658   10/14/21 1400  ceFEPIme (MAXIPIME) 2 g in sodium chloride 0.9 % 100 mL IVPB        2 g 200 mL/hr over 30 Minutes Intravenous  Once 10/14/21 1346 10/14/21 1454   10/14/21 1400  metroNIDAZOLE (FLAGYL) IVPB 500 mg        500 mg 100 mL/hr over 60 Minutes Intravenous  Once 10/14/21 1346 10/14/21 1633   10/14/21 1400  vancomycin (VANCOCIN) IVPB 1000 mg/200 mL premix        1,000 mg 200 mL/hr over 60 Minutes Intravenous  Once 10/14/21 1346 10/14/21 1645         Micro    Objective   Vitals:   10/14/21 1843 10/15/21 0116 10/15/21 0454 10/15/21 0805  BP: 137/68 109/87 135/69 (!) 150/89  Pulse: 73 63 (!) 55 60  Resp:  '18 18 18 16  ' Temp: 98.8 F (37.1 C) (!) 97.5 F (36.4 C) (!) 97.5 F (36.4 C) 97.6 F (36.4 C)  TempSrc: Oral Oral Oral Oral  SpO2: 98% 95% 98% 97%  Weight:   89.1 kg   Height: 5' 10.51" (1.791 m)       Intake/Output Summary (Last 24 hours) at 10/15/2021 0823 Last data filed at 10/15/2021 0203 Gross per 24 hour  Intake 1816.67 ml  Output 1451 ml  Net 365.67 ml   Filed Weights   10/14/21 1813 10/15/21 0454  Weight: 94.8 kg 89.1 kg    Physical Exam:  General exam: awake, alert, no acute distress HEENT: atraumatic, clear conjunctiva, anicteric sclera, moist mucus membranes, hearing grossly normal  Respiratory system: CTAB, no wheezes, rales or rhonchi, normal respiratory effort. Cardiovascular system: normal S1/S2, RRR, no JVD, murmurs, rubs, gallops, no pedal edema.   Gastrointestinal system: soft, NT, ND, no HSM felt, +bowel sounds. Central nervous system: A&O x4. no gross focal neurologic deficits, normal speech Extremities: moves all, no edema, normal tone Skin: dry, intact, normal temperature Psychiatry: normal mood, congruent affect, judgement and insight appear normal  Labs   Data Reviewed: I have personally reviewed following labs and imaging studies  CBC: Recent Labs  Lab 10/14/21 0901 10/15/21 0222  WBC 5.4 3.9*  NEUTROABS 4.3  --   HGB 13.6 12.5*  HCT 39.6 36.3*  MCV 90.4 90.5  PLT 123* 761*   Basic Metabolic Panel: Recent Labs  Lab 10/14/21 0901 10/14/21 1847 10/15/21 0222  NA 134*  --  140  K 3.2*  --  4.3  CL 96*  --  102  CO2 25  --  31  GLUCOSE 177*  --  173*  BUN 13  --  10  CREATININE 1.14  --  0.94  CALCIUM 9.1  --  9.1  MG  --  2.1  --    GFR: Estimated Creatinine Clearance: 77 mL/min (by C-G formula based on SCr of 0.94 mg/dL). Liver Function Tests: Recent Labs  Lab 10/14/21 0901 10/15/21 0222  AST 109* 72*  ALT 180* 139*  ALKPHOS 242* 234*  BILITOT 2.1* 1.5*  PROT 7.4 7.1  ALBUMIN 3.6 3.2*   Recent Labs  Lab  10/14/21 1847  LIPASE 30   No results for input(s): AMMONIA in the last 168 hours. Coagulation Profile: Recent Labs  Lab 10/14/21 0901  INR 1.0   Cardiac Enzymes: No results for input(s): CKTOTAL, CKMB, CKMBINDEX, TROPONINI in the last 168 hours. BNP (last 3 results) Recent Labs    07/21/21 0948 09/16/21 1639  PROBNP 170.0* 110   HbA1C: No results for input(s): HGBA1C in the last 72 hours. CBG: Recent Labs  Lab 10/15/21 0809  GLUCAP 144*   Lipid Profile: No results for input(s): CHOL, HDL, LDLCALC, TRIG, CHOLHDL, LDLDIRECT in the last 72 hours. Thyroid Function Tests: No results for input(s): TSH, T4TOTAL, FREET4, T3FREE, THYROIDAB in the last 72 hours. Anemia Panel: No results for input(s): VITAMINB12, FOLATE, FERRITIN, TIBC, IRON, RETICCTPCT in the last 72 hours. Sepsis Labs: Recent Labs  Lab 10/14/21 0901 10/14/21 1315 10/14/21 1847  PROCALCITON 4.10  --   --   LATICACIDVEN  --  2.3* 1.4    Recent Results (from the past 240 hour(s))  Resp Panel by RT-PCR (Flu A&B, Covid) Nasopharyngeal Swab     Status: None   Collection Time: 10/14/21  9:01 AM   Specimen: Nasopharyngeal Swab; Nasopharyngeal(NP) swabs in vial transport medium  Result Value Ref Range Status   SARS Coronavirus 2 by RT PCR NEGATIVE NEGATIVE Final    Comment: (NOTE) SARS-CoV-2 target nucleic acids are NOT DETECTED.  The SARS-CoV-2 RNA is generally detectable in upper respiratory specimens during the acute phase of infection. The lowest concentration of SARS-CoV-2 viral copies this assay can detect is 138 copies/mL. A negative result does not preclude SARS-Cov-2 infection and should not be used as the sole basis for treatment or other patient management decisions. A negative result may occur with  improper specimen collection/handling, submission of specimen other than nasopharyngeal swab, presence of viral mutation(s) within the areas targeted by this assay, and inadequate number of  viral copies(<138 copies/mL). A negative result must be combined with clinical observations,  patient history, and epidemiological information. The expected result is Negative.  Fact Sheet for Patients:  EntrepreneurPulse.com.au  Fact Sheet for Healthcare Providers:  IncredibleEmployment.be  This test is no t yet approved or cleared by the [place] FDA and  has been authorized for detection and/or diagnosis of SARS-CoV-2 by FDA under an Emergency Use Authorization (EUA). This EUA will remain  in effect (meaning this test can be used) for the duration of the COVID-19 declaration under Section 564(b)(1) of the Act, 21 U.S.C.section 360bbb-3(b)(1), unless the authorization is terminated  or revoked sooner.       Influenza A by PCR NEGATIVE NEGATIVE Final   Influenza B by PCR NEGATIVE NEGATIVE Final    Comment: (NOTE) The Xpert Xpress SARS-CoV-2/FLU/RSV plus assay is intended as an aid in the diagnosis of influenza from Nasopharyngeal swab specimens and should not be used as a sole basis for treatment. Nasal washings and aspirates are unacceptable for Xpert Xpress SARS-CoV-2/FLU/RSV testing.  Fact Sheet for Patients: EntrepreneurPulse.com.au  Fact Sheet for Healthcare Providers: IncredibleEmployment.be  This test is not yet approved or cleared by the [place] FDA and has been authorized for detection and/or diagnosis of SARS-CoV-2 by FDA under an Emergency Use Authorization (EUA). This EUA will remain in effect (meaning this test can be used) for the duration of the COVID-19 declaration under Section 564(b)(1) of the Act, 21 U.S.C. section 360bbb-3(b)(1), unless the authorization is terminated or revoked.  Performed at [facility], [address] [place], [place] [address]   Blood Culture (routine x 2)     Status: None (Preliminary result)   Collection Time: 10/14/21  1:15 PM    Specimen: BLOOD  Result Value Ref Range Status   Specimen Description BLOOD BLOOD LEFT HAND  Final   Special Requests   Final    BOTTLES DRAWN AEROBIC AND ANAEROBIC Blood Culture adequate volume   Culture   Final    NO GROWTH < 24 HOURS Performed at [facility], [address], [place], [place] [address]    Report Status PENDING  Incomplete  Blood Culture (routine x 2)     Status: None (Preliminary result)   Collection Time: 10/14/21  1:15 PM   Specimen: BLOOD  Result Value Ref Range Status   Specimen Description BLOOD RIGHT ANTECUBITAL  Final   Special Requests   Final    BOTTLES DRAWN AEROBIC AND ANAEROBIC Blood Culture adequate volume   Culture   Final    NO GROWTH < 24 HOURS Performed at [facility], [address] [place], [place] [address]    Report Status PENDING  Incomplete      Imaging Studies   DG Chest 2 View  Result Date: 10/14/2021 CLINICAL DATA:  Headache, right-sided ear pain since Saturday. History of COPD EXAM: CHEST - 2 VIEW COMPARISON:  Chest radiograph 2 9 FINDINGS: The cardiomediastinal silhouette is stable. There is slight asymmetric elevation of the right hemidiaphragm. Linear opacities projecting over the right mid lung likely reflect atelectasis or scar. There is no focal consolidation. There is no pulmonary edema. There is no pleural effusion or pneumothorax. There is mild degenerative change of the imaged spine. There is no acute osseous abnormality. IMPRESSION: No radiographic evidence of acute cardiopulmonary process. Electronically Signed   By: Valetta Mole M.D.   On: 10/14/2021 09:30   CT Soft Tissue Neck W Contrast  Result Date: 10/14/2021 CLINICAL DATA:  Rule out neck abscess. Headache and fever. Right ear pain. EXAM: CT  NECK WITH CONTRAST TECHNIQUE: Multidetector CT imaging of the neck was performed using the standard protocol following the bolus administration of intravenous contrast. CONTRAST:  125m OMNIPAQUE IOHEXOL 300  MG/ML  SOLN COMPARISON:  None. FINDINGS: Pharynx and larynx: Normal. No mass or swelling. Salivary glands: No inflammation, mass, or stone. Incidental lipoma posterior to the submandibular gland on the right. Thyroid: Small thyroid.  No focal lesion. Lymph nodes: Negative for enlarged or pathologic lymph nodes in the neck. Vascular: Normal arterial enhancement. Atherosclerotic disease in the carotid bifurcation bilaterally. Limited intracranial: Negative Visualized orbits: Negative Mastoids and visualized paranasal sinuses: Paranasal sinuses clear. Mastoid clear bilaterally. Skeleton: Cervical spondylosis.  No acute skeletal abnormality. Upper chest: Lung apices clear bilaterally.  No acute abnormality Other: None IMPRESSION: Negative for pharyngeal mass or abscess. No acute abnormality in the neck. Electronically Signed   By: CFranchot GalloM.D.   On: 10/14/2021 15:42   CT Abdomen Pelvis W Contrast  Result Date: 10/14/2021 CLINICAL DATA:  Abdomen pain EXAM: CT ABDOMEN AND PELVIS WITH CONTRAST TECHNIQUE: Multidetector CT imaging of the abdomen and pelvis was performed using the standard protocol following bolus administration of intravenous contrast. CONTRAST:  1074mOMNIPAQUE IOHEXOL 300 MG/ML  SOLN COMPARISON:  CT 10/18/2019, nuclear medicine renal imaging 09/07/2020 FINDINGS: Lower chest: Lung bases demonstrate no acute consolidation or effusion. Cardiac size within normal limits. Hepatobiliary: No focal liver abnormality is seen. No gallstones, gallbladder wall thickening, or biliary dilatation. Pancreas: Unremarkable. No pancreatic ductal dilatation or surrounding inflammatory changes. Spleen: Normal in size without focal abnormality. Adrenals/Urinary Tract: Adrenal glands are normal. Minimal right hydronephrosis without obstructing stone. Status post right ureteral reimplantation to the right bladder dome. Bladder is otherwise unremarkable. Stomach/Bowel: Stomach is within normal limits. Appendix not seen  corresponding to history of appendectomy. No evidence of bowel wall thickening, distention, or inflammatory changes. Vascular/Lymphatic: Mild aortic atherosclerosis. No aneurysm. No suspicious nodes Reproductive: Negative for mass. Post treatment changes of the prostate. Other: Negative for pelvic effusion or free air Musculoskeletal: No acute osseous abnormality. Interbody devices at L4-L5 and L5-S1. IMPRESSION: 1. No CT evidence for acute intra-abdominal or pelvic abnormality. 2. Minimal right hydronephrosis with reimplantation of right ureter. No obstructing stone. Electronically Signed   By: KiDonavan Foil.D.   On: 10/14/2021 15:59   [place]OMEN LIMITED RUQ (LIVER/GB)  Result Date: 10/14/2021 CLINICAL DATA:  Abnormal LFTs EXAM: ULTRASOUND ABDOMEN LIMITED RIGHT UPPER QUADRANT COMPARISON:  None. FINDINGS: Gallbladder: No gallstones or wall thickening visualized. No sonographic Murphy sign noted by sonographer. Common bile duct: Diameter: Upper limits normal in caliber for patient's age, 8 mm. No visible ductal stones. Liver: Increased echotexture compatible with fatty infiltration. No focal abnormality or biliary ductal dilatation. Portal vein is patent on color Doppler imaging with normal direction of blood flow towards the liver. Other: None. IMPRESSION: Hepatic steatosis.  No acute findings. Electronically Signed   By: KeRolm Baptise.D.   On: 10/14/2021 18:42     Medications   Scheduled Meds:  aspirin EC  81 mg Oral Daily   DULoxetine  60 mg Oral BID   heparin  5,000 Units Subcutaneous Q8H   methylPREDNISolone (SOLU-MEDROL) injection  40 mg Intravenous Daily   pantoprazole  40 mg Oral Daily   simvastatin  40 mg Oral QHS   Continuous Infusions:  piperacillin-tazobactam (ZOSYN)  IV 3.375 g (10/15/21 0205)   vancomycin         LOS: 0 days    Time spent: 30 minutes  Ezekiel Slocumb, DO [facility]  10/15/2021, 8:23 AM      If 7PM-7AM, please contact  night-coverage. How to contact the [facility] Acute Care Attending or Consulting provider [place] or covering provider during after hours [place], for this patient?    Check the care team in [facility] and look for a) attending/consulting [facility] provider listed and b) the [facility] team listed Log into [email or web address] and use [place]'s universal password to access. If you do not have the password, please contact the hospital operator. Locate the [facility] provider you are looking for under [facility] and page to a number that you can be directly reached. If you still have difficulty reaching the provider, please page the [facility] (Director on Call) for the Hospitalists listed on amion for assistance.

## 2021-10-16 DIAGNOSIS — I10 Essential (primary) hypertension: Secondary | ICD-10-CM | POA: Diagnosis not present

## 2021-10-16 DIAGNOSIS — R509 Fever, unspecified: Secondary | ICD-10-CM | POA: Diagnosis not present

## 2021-10-16 LAB — CBC
HCT: 34.9 % — ABNORMAL LOW (ref 39.0–52.0)
Hemoglobin: 12.2 g/dL — ABNORMAL LOW (ref 13.0–17.0)
MCH: 31.4 pg (ref 26.0–34.0)
MCHC: 35 g/dL (ref 30.0–36.0)
MCV: 89.7 fL (ref 80.0–100.0)
Platelets: 154 10*3/uL (ref 150–400)
RBC: 3.89 MIL/uL — ABNORMAL LOW (ref 4.22–5.81)
RDW: 12.4 % (ref 11.5–15.5)
WBC: 8.7 10*3/uL (ref 4.0–10.5)
nRBC: 0 % (ref 0.0–0.2)

## 2021-10-16 LAB — BASIC METABOLIC PANEL WITH GFR
Anion gap: 8 (ref 5–15)
BUN: 14 mg/dL (ref 8–23)
CO2: 29 mmol/L (ref 22–32)
Calcium: 8.9 mg/dL (ref 8.9–10.3)
Chloride: 102 mmol/L (ref 98–111)
Creatinine, Ser: 0.81 mg/dL (ref 0.61–1.24)
GFR, Estimated: >60 mL/min
Glucose, Bld: 139 mg/dL — ABNORMAL HIGH (ref 70–99)
Potassium: 3.2 mmol/L — ABNORMAL LOW (ref 3.5–5.1)
Sodium: 139 mmol/L (ref 135–145)

## 2021-10-16 LAB — GLUCOSE, CAPILLARY: Glucose-Capillary: 127 mg/dL — ABNORMAL HIGH (ref 70–99)

## 2021-10-16 MED ORDER — PREDNISONE 50 MG PO TABS
60.0000 mg | ORAL_TABLET | Freq: Every day | ORAL | Status: DC
  Administered 2021-10-16: 60 mg via ORAL
  Filled 2021-10-16: qty 1

## 2021-10-16 MED ORDER — TRAZODONE HCL 50 MG PO TABS: 50.0000 mg | ORAL_TABLET | Freq: Every evening | ORAL | 0 refills | Status: AC | PRN

## 2021-10-16 MED ORDER — BLOOD GLUCOSE MONITOR KIT: PACK | 0 refills | Status: DC

## 2021-10-16 MED ORDER — POTASSIUM CHLORIDE CRYS ER 20 MEQ PO TBCR
40.0000 meq | EXTENDED_RELEASE_TABLET | ORAL | Status: DC
  Administered 2021-10-16: 40 meq via ORAL
  Filled 2021-10-16: qty 2

## 2021-10-16 MED ORDER — PREDNISONE 20 MG PO TABS: 60.0000 mg | ORAL_TABLET | Freq: Every day | ORAL | 0 refills | Status: DC

## 2021-10-16 MED ORDER — PREDNISONE 20 MG PO TABS: 60.0000 mg | ORAL_TABLET | Freq: Every day | ORAL | 1 refills | Status: AC

## 2021-10-16 MED ORDER — AMLODIPINE BESYLATE 5 MG PO TABS
5.0000 mg | ORAL_TABLET | Freq: Every day | ORAL | Status: DC
  Administered 2021-10-16: 5 mg via ORAL
  Filled 2021-10-16: qty 1

## 2021-10-16 NOTE — Progress Notes (Signed)
Pt being discharged home, discharge instructions reviewed with pt and wife, states understanding, pt with no complaints at discharge 

## 2021-10-16 NOTE — Discharge Summary (Addendum)
Physician Discharge Summary  Albert Wade:[id number] DOB: 15-Mar-1946 DOA: 10/14/2021  PCP: Biagio Borg, MD  Admit date: 10/14/2021 Discharge date: 11/09/2021  Admitted From: home Disposition:  home  Recommendations for Outpatient Follow-up:  Follow up with PCP in 1-2 weeks Please obtain BMP/CBC in one week Please follow up with Dr. Jefm Bryant, rheumatology Please follow up on Temporal Artery Biopsy results Please follow up with Dr. Lysle Pearl, general surgery Follow up on patient's blood glucose while on steroids  Home Health: no  Equipment/Devices: none   Discharge Condition: stable  CODE STATUS: full  Diet recommendation: Regular    Discharge Diagnoses: Principal Problem:   Fever Active Problems:   HLD (hyperlipidemia)   Depression with anxiety   Abnormal LFTs   Chronic diastolic CHF (congestive heart failure) (HCC)   COPD (chronic obstructive pulmonary disease) ([place])   CAD (coronary artery disease)   Acute metabolic encephalopathy   Hypokalemia   HTN (hypertension)   Elevated lactic acid level    Summary of HPI and Hospital Course:  Albert Wade is a 75 y.o. male with medical history significant of hypertension, hyperlipidemia, COPD, GERD, depression with anxiety, varicose vein, prostate cancer, BPH, OSA, CAD, myocardial infarction, dCHF, who presented to the ED on 10/15/2021 with fevers up to 103.4 at home.  He also reported right-sided jaw and temporal area pain.     He was admitted and started on broad spectrum antibiotics empirically.  IV steroids were started and inflammatory markers ordered, with consideration for giant cell arteritis.  No source of infection was identified.  ESR and CPR both markedly elevated.  Patient later reported recently having some transient vision changes in the right eye.  He also reported a remote history of right-sided cluster headaches, although many years ago.    General surgery was consulted to obtain temporal artery biopsy.   Patient was treated with empiric IV steroids.    No signs of infection or recurrent fevers after empiric antibiotics were stopped.     Fever and vision changes Suspected Giant Cell Arteritis / Temporal Arteritis - not yet ruled out with biopsy pending. High suspicion given unexplained fevers, symptoms reported as above and elevated inflammatory markers, and vision changes reported by patient. Initially treated with empiric antibiotics.  Remained asymptomatic / no ssx's of infection off antibiotics Treated with IV steroids and transitioned to Prednisone. General surgery was consulted for TA biopsy Follow up temporal artery biopsy results. Continued on empiric steroids while biopsy pending Follow up with Dr. Jefm Bryant, rheumatology, next week.   Pre-diabetes - Hbg A1c 6.1% in July 2022. Discussed in detail with patient and wife, high risk for steroid-induced hyperglycemia. Sent glucometer and patient advised to monitor blood glucose while on steroids.   Recommend very close PCP follow up.  Abnormal liver function - RUQ U/S showed hepatic steatosis.  Hepatitis panel negative.  Normal Tylenol level. --Monitor LFT's in follow up   Elevated lactic acid level - Lactic acid 2.3, repeat normal after IV fluids.  Did not meet sepsis criteria.    Suspect mild dehydration vs related to fevers.     Hyperlipidemia - Zocor   Depression with anxiety - continue home medications   Chronic diastolic CHF - compensated, overall euvolemic. 2D echo on 08/10/2021 showed EF of 55 to 60% with grade 1 diastolic dysfunction.   Lasix held on admission due to concern for developing sepsis, resumed. Daily weights to monitor volume status. Monitor renal function and electrolytes in follow up.   COPD-  Stable, No wheezing or rhonchi to suggest exacerbation.  Continue bronchodilators / home regimen.   CAD- No chest pain, stable.  Continue ASA and Zocor.   Acute metabolic encephalopathy resolved. Patient had  disorientation at home when patient had a fever.  Now alert and oriented x3.  No confusion.   Hypokalemia - K 3.2 on admission, replaced.   Monitor and replace as needed.    Hypertension - Blood pressure 196/86 on arrival, improved to 137/68.  Resumed home amlodipine.   PCP follow up.  Discharge Instructions   Discharge Instructions     Call MD for:   Complete by: As directed    Vision loss of vision changes  Blood sugars running high - especially if consistently above 200.   Call MD for:  extreme fatigue   Complete by: As directed    Call MD for:  persistant dizziness or light-headedness   Complete by: As directed    Call MD for:  severe uncontrolled pain   Complete by: As directed    Call MD for:  temperature >100.4   Complete by: As directed    Diet - low sodium heart healthy   Complete by: As directed    Discharge instructions   Complete by: As directed    --Continue taking prednisone every morning with breakfast.  --Follow up with Dr. Jefm Bryant, rheumatologist.  He will go over the results of your temporal artery biopsy and determine whether you should continue on prednisone and adjust the dose, if needed.  --Follow up with Dr. Lysle Pearl, general surgeon, as scheduled to check your incision.  --I sent trazodone prescription to help with sleep.  Steroids can make it difficult to sleep because they increase your energy in general.    --Please have your primary care provider check AM labs (before any food) within about a week.   Steroids can increase your blood sugar, so we need to make sure it's not running too high.   Discharge wound care:   Complete by: As directed    Follow surgeon's instructions for caring for incision site and follow up in clinic. Call surgeon's office if any swelling, redness or drainage from the incision site.   Increase activity slowly   Complete by: As directed       Allergies as of 10/16/2021       Reactions   Cyclobenzaprine    agitation     Seroquel [quetiapine] Hives        Medication List     STOP taking these medications    Eszopiclone 3 MG Tabs       TAKE these medications    albuterol 108 (90 Base) MCG/ACT inhaler Commonly known as: VENTOLIN HFA INHALE 2 PUFFS INTO THE LUNGS 4 TIMES DAILY AS NEEDED   ALPRAZolam 0.5 MG tablet Commonly known as: XANAX Take 0.5 mg by mouth 2 (two) times daily as needed for anxiety.   amLODipine 2.5 MG tablet Commonly known as: NORVASC Take 1 tablet (2.5 mg total) by mouth daily.   aspirin 81 MG tablet Take 81 mg by mouth daily.   blood glucose meter kit and supplies Kit Dispense based on patient and insurance preference. Use up to four times daily as directed.   DULoxetine 60 MG capsule Commonly known as: CYMBALTA Take 60 mg by mouth 2 (two) times daily.   fluticasone 44 MCG/ACT inhaler Commonly known as: Flovent HFA Inhale 2 puffs into the lungs 2 (two) times daily as needed.   furosemide 40  MG tablet Commonly known as: LASIX Take 1 tablet (40 mg total) by mouth 2 (two) times daily.   omeprazole 20 MG capsule Commonly known as: PriLOSEC Take 1 capsule (20 mg total) by mouth 2 (two) times daily.   oxyCODONE-acetaminophen 10-325 MG tablet Commonly known as: PERCOCET Take 1 tablet by mouth every 4 (four) hours as needed for pain. What changed:  how much to take additional instructions   potassium chloride 10 MEQ tablet Commonly known as: KLOR-CON Take 2 tablets (20 mEq total) by mouth daily.   predniSONE 20 MG tablet Commonly known as: DELTASONE Take 3 tablets (60 mg total) by mouth daily with breakfast.   simvastatin 40 MG tablet Commonly known as: ZOCOR Take 1 tablet (40 mg total) by mouth at bedtime.   traZODone 50 MG tablet Commonly known as: DESYREL Take 1 tablet (50 mg total) by mouth at bedtime as needed for sleep.               Discharge Care Instructions  (From admission, onward)           Start     Ordered   10/16/21  0000  Discharge wound care:       Comments: Follow surgeon's instructions for caring for incision site and follow up in clinic. Call surgeon's office if any swelling, redness or drainage from the incision site.   10/16/21 1015            Follow-up Information     Sakai, Isami, DO. Call in 2 week(s).   Specialty: Surgery Why: post op temporal artery biopsy Contact information: [place] [address] [phone]         Emmaline Kluver., MD. Schedule an appointment as soon as possible for a visit.   Specialty: Rheumatology Why: Patient needs follow up for possible giant cell arteritis.  Temporal artery biopsy pending. Patient to call Monday when Office is open. Contact information: [place] [address] [phone]         Biagio Borg, MD. Schedule an appointment as soon as possible for a visit in 1 week(s).   Specialties: Internal Medicine, Radiology Why: Hospital follow up. Patient to call Monday when office is open. Contact information: [place] [address] [phone]         Fay Records, MD .   Specialty: Cardiology Why: Patient to call on Monday when Office is Open Contact information: [address] [place] [address] [phone]                Allergies  Allergen Reactions   Cyclobenzaprine     agitation    Seroquel [Quetiapine] Hives     If you experience worsening of your admission symptoms, develop shortness of breath, life threatening emergency, suicidal or homicidal thoughts you must seek medical attention immediately by calling 911 or calling your MD immediately  if symptoms less severe.    Please note   You were cared for by a hospitalist during your hospital stay. If you have any questions about your discharge medications or the care you received while you were in the hospital after you are discharged, you can  call the unit and asked to speak with the hospitalist on call if the hospitalist that took care of you is not available. Once you are discharged, your primary care physician will handle any further medical issues. Please note that NO REFILLS  for any discharge medications will be authorized once you are discharged, as it is imperative that you return to your primary care physician (or establish a relationship with a primary care physician if you do not have one) for your aftercare needs so that they can reassess your need for medications and monitor your lab values.   Consultations: General surgery    Procedures/Studies: DG Chest 2 View  Result Date: 10/14/2021 CLINICAL DATA:  Headache, right-sided ear pain since Saturday. History of COPD EXAM: CHEST - 2 VIEW COMPARISON:  Chest radiograph 2 9 FINDINGS: The cardiomediastinal silhouette is stable. There is slight asymmetric elevation of the right hemidiaphragm. Linear opacities projecting over the right mid lung likely reflect atelectasis or scar. There is no focal consolidation. There is no pulmonary edema. There is no pleural effusion or pneumothorax. There is mild degenerative change of the imaged spine. There is no acute osseous abnormality. IMPRESSION: No radiographic evidence of acute cardiopulmonary process. Electronically Signed   By: Valetta Mole M.D.   On: 10/14/2021 09:30   CT Soft Tissue Neck W Contrast  Result Date: 10/14/2021 CLINICAL DATA:  Rule out neck abscess. Headache and fever. Right ear pain. EXAM: CT NECK WITH CONTRAST TECHNIQUE: Multidetector CT imaging of the neck was performed using the standard protocol following the bolus administration of intravenous contrast. CONTRAST:  148m OMNIPAQUE IOHEXOL 300 MG/ML  SOLN COMPARISON:  None. FINDINGS: Pharynx and larynx: Normal. No mass or swelling. Salivary glands: No inflammation, mass, or stone. Incidental lipoma posterior to the submandibular gland on the right. Thyroid: Small  thyroid.  No focal lesion. Lymph nodes: Negative for enlarged or pathologic lymph nodes in the neck. Vascular: Normal arterial enhancement. Atherosclerotic disease in the carotid bifurcation bilaterally. Limited intracranial: Negative Visualized orbits: Negative Mastoids and visualized paranasal sinuses: Paranasal sinuses clear. Mastoid clear bilaterally. Skeleton: Cervical spondylosis.  No acute skeletal abnormality. Upper chest: Lung apices clear bilaterally.  No acute abnormality Other: None IMPRESSION: Negative for pharyngeal mass or abscess. No acute abnormality in the neck. Electronically Signed   By: CFranchot GalloM.D.   On: 10/14/2021 15:42   CT Abdomen Pelvis W Contrast  Result Date: 10/14/2021 CLINICAL DATA:  Abdomen pain EXAM: CT ABDOMEN AND PELVIS WITH CONTRAST TECHNIQUE: Multidetector CT imaging of the abdomen and pelvis was performed using the standard protocol following bolus administration of intravenous contrast. CONTRAST:  1053mOMNIPAQUE IOHEXOL 300 MG/ML  SOLN COMPARISON:  CT 10/18/2019, nuclear medicine renal imaging 09/07/2020 FINDINGS: Lower chest: Lung bases demonstrate no acute consolidation or effusion. Cardiac size within normal limits. Hepatobiliary: No focal liver abnormality is seen. No gallstones, gallbladder wall thickening, or biliary dilatation. Pancreas: Unremarkable. No pancreatic ductal dilatation or surrounding inflammatory changes. Spleen: Normal in size without focal abnormality. Adrenals/Urinary Tract: Adrenal glands are normal. Minimal right hydronephrosis without obstructing stone. Status post right ureteral reimplantation to the right bladder dome. Bladder is otherwise unremarkable. Stomach/Bowel: Stomach is within normal limits. Appendix not seen corresponding to history of appendectomy. No evidence of bowel wall thickening, distention, or inflammatory changes. Vascular/Lymphatic: Mild aortic atherosclerosis. No aneurysm. No suspicious nodes Reproductive: Negative  for mass. Post treatment changes of the prostate. Other: Negative for pelvic effusion or free air Musculoskeletal: No acute osseous abnormality. Interbody devices at L4-L5 and L5-S1. IMPRESSION: 1. No CT evidence for acute intra-abdominal or pelvic abnormality. 2. Minimal right hydronephrosis with reimplantation of right ureter. No obstructing stone. Electronically Signed   By: KiDonavan Foil.D.   On: 10/14/2021 15:59   [place]  ABDOMEN LIMITED RUQ (LIVER/GB)  Result Date: 10/14/2021 CLINICAL DATA:  Abnormal LFTs EXAM: ULTRASOUND ABDOMEN LIMITED RIGHT UPPER QUADRANT COMPARISON:  None. FINDINGS: Gallbladder: No gallstones or wall thickening visualized. No sonographic Murphy sign noted by sonographer. Common bile duct: Diameter: Upper limits normal in caliber for patient's age, 8 mm. No visible ductal stones. Liver: Increased echotexture compatible with fatty infiltration. No focal abnormality or biliary ductal dilatation. Portal vein is patent on color Doppler imaging with normal direction of blood flow towards the liver. Other: None. IMPRESSION: Hepatic steatosis.  No acute findings. Electronically Signed   By: Rolm Baptise M.D.   On: 10/14/2021 18:42     Temporal Artery Biopsy   Subjective: Pt doing well this AM.  Denies fevers/chills, states overall feeling much better. Wife at bedside.  We talked about risk of high blood sugars on steroids and him being in pre-diabetes range by last A1c.  He reports has close follow up with PCP and agreeable to monitoring sugars at home closely.   Discharge Exam: Vitals:   10/16/21 0608 10/16/21 0747  BP: (!) 148/70 138/75  Pulse: (!) 59 (!) 57  Resp: 14 18  Temp: (!) 97.3 F (36.3 C) (!) 97.4 F (36.3 C)  SpO2: 94%    Vitals:   10/15/21 1945 10/15/21 2354 10/16/21 0608 10/16/21 0747  BP: (!) 165/82 (!) 144/83 (!) 148/70 138/75  Pulse: 65 63 (!) 59 (!) 57  Resp: '16 20 14 18  ' Temp: 97.7 F (36.5 C) 97.7 F (36.5 C) (!) 97.3 F (36.3 C) (!) 97.4 F (36.3  C)  TempSrc:   Oral Oral  SpO2: 93% 96% 94%   Weight:   93.4 kg   Height:        General: Pt is alert, awake, not in acute distress Cardiovascular: RRR, S1/S2 +, no rubs, no gallops Respiratory: CTA bilaterally, no wheezing, no rhonchi Abdominal: Soft, NT, ND, bowel sounds + Extremities: no edema, no cyanosis    The results of significant diagnostics from this hospitalization (including imaging, microbiology, ancillary and laboratory) are listed below for reference.     Microbiology: No results found for this or any previous visit (from the past 240 hour(s)).    Labs: BNP (last 3 results) Recent Labs    10/14/21 0900  BNP 25.3   Basic Metabolic Panel: No results for input(s): NA, K, CL, CO2, GLUCOSE, BUN, CREATININE, CALCIUM, MG, PHOS in the last 168 hours.  Liver Function Tests: No results for input(s): AST, ALT, ALKPHOS, BILITOT, PROT, ALBUMIN in the last 168 hours.  No results for input(s): LIPASE, AMYLASE in the last 168 hours.  No results for input(s): AMMONIA in the last 168 hours. CBC: No results for input(s): WBC, NEUTROABS, HGB, HCT, MCV, PLT in the last 168 hours.  Cardiac Enzymes: No results for input(s): CKTOTAL, CKMB, CKMBINDEX, TROPONINI in the last 168 hours. BNP: Invalid input(s): POCBNP CBG: No results for input(s): GLUCAP in the last 168 hours.  D-Dimer No results for input(s): DDIMER in the last 72 hours. Hgb A1c No results for input(s): HGBA1C in the last 72 hours. Lipid Profile No results for input(s): CHOL, HDL, LDLCALC, TRIG, CHOLHDL, LDLDIRECT in the last 72 hours. Thyroid function studies No results for input(s): TSH, T4TOTAL, T3FREE, THYROIDAB in the last 72 hours.  Invalid input(s): FREET3 Anemia work up No results for input(s): VITAMINB12, FOLATE, FERRITIN, TIBC, IRON, RETICCTPCT in the last 72 hours. Urinalysis    Component Value Date/Time   COLORURINE AMBER (A) 10/14/2021  [place] (A) 10/14/2021 1315    APPEARANCEUR Cloudy (A) 07/14/2020 1349   LABSPEC 1.013 10/14/2021 1315   PHURINE 6.0 10/14/2021 1315   GLUCOSEU NEGATIVE 10/14/2021 [place] 07/21/2021 0948   HGBUR NEGATIVE 10/14/2021 [place] 10/14/2021 1315   BILIRUBINUR Negative 07/14/2020 [place] 10/14/2021 1315   PROTEINUR 30 (A) 10/14/2021 1315   UROBILINOGEN 1.0 07/21/2021 0948   NITRITE NEGATIVE 10/14/2021 1315   LEUKOCYTESUR NEGATIVE 10/14/2021 1315   Sepsis Labs Invalid input(s): PROCALCITONIN,  WBC,  LACTICIDVEN Microbiology No results found for this or any previous visit (from the past 240 hour(s)).    Time coordinating discharge: Over 30 minutes  SIGNED:   Ezekiel Slocumb, DO [facility] 11/09/2021, 12:17 PM   If 7PM-7AM, please contact night-coverage [email or web address]

## 2021-10-18 ENCOUNTER — Encounter: Payer: Self-pay | Admitting: Surgery

## 2021-10-18 DIAGNOSIS — G894 Chronic pain syndrome: Secondary | ICD-10-CM | POA: Diagnosis not present

## 2021-10-18 DIAGNOSIS — M25579 Pain in unspecified ankle and joints of unspecified foot: Secondary | ICD-10-CM | POA: Diagnosis not present

## 2021-10-18 DIAGNOSIS — M542 Cervicalgia: Secondary | ICD-10-CM | POA: Diagnosis not present

## 2021-10-18 DIAGNOSIS — Z5181 Encounter for therapeutic drug level monitoring: Secondary | ICD-10-CM | POA: Diagnosis not present

## 2021-10-18 DIAGNOSIS — M48061 Spinal stenosis, lumbar region without neurogenic claudication: Secondary | ICD-10-CM | POA: Diagnosis not present

## 2021-10-18 DIAGNOSIS — Z79899 Other long term (current) drug therapy: Secondary | ICD-10-CM | POA: Diagnosis not present

## 2021-10-18 DIAGNOSIS — K5909 Other constipation: Secondary | ICD-10-CM | POA: Diagnosis not present

## 2021-10-19 LAB — CULTURE, BLOOD (ROUTINE X 2)
Culture: NO GROWTH
Culture: NO GROWTH
Special Requests: ADEQUATE
Special Requests: ADEQUATE

## 2021-10-19 LAB — SURGICAL PATHOLOGY

## 2021-10-21 ENCOUNTER — Encounter: Payer: Self-pay | Admitting: Internal Medicine

## 2021-10-21 DIAGNOSIS — R700 Elevated erythrocyte sedimentation rate: Secondary | ICD-10-CM | POA: Diagnosis not present

## 2021-10-21 DIAGNOSIS — R7989 Other specified abnormal findings of blood chemistry: Secondary | ICD-10-CM | POA: Diagnosis not present

## 2021-10-25 NOTE — Anesthesia Postprocedure Evaluation (Signed)
Anesthesia Post Note  Patient: Albert Wade  Procedure(s) Performed: BIOPSY TEMPORAL ARTERY (Right: Head)  Patient location during evaluation: PACU Anesthesia Type: General Level of consciousness: awake and alert Pain management: pain level controlled Vital Signs Assessment: post-procedure vital signs reviewed and stable Respiratory status: spontaneous breathing, nonlabored ventilation, respiratory function stable and patient connected to nasal cannula oxygen Cardiovascular status: blood pressure returned to baseline and stable Postop Assessment: no apparent nausea or vomiting Anesthetic complications: no   No notable events documented.   Last Vitals:  Vitals:   10/16/21 0608 10/16/21 0747  BP: (!) 148/70 138/75  Pulse: (!) 59 (!) 57  Resp: 14 18  Temp: (!) 36.3 C (!) 36.3 C  SpO2: 94%     Last Pain:  Vitals:   10/16/21 0747  TempSrc: Oral  PainSc: 0-No pain                 Molli Barrows

## 2021-11-08 DIAGNOSIS — R700 Elevated erythrocyte sedimentation rate: Secondary | ICD-10-CM | POA: Diagnosis not present

## 2021-11-24 ENCOUNTER — Telehealth: Payer: Self-pay | Admitting: Gastroenterology

## 2021-11-24 NOTE — Telephone Encounter (Signed)
Inbound call from pt's wife requesting to pick up some samples of Linzess if we have some available for tomorrow. Please advise. Thank you.

## 2021-11-25 NOTE — Telephone Encounter (Signed)
Called patient's wife-Albert Wade and informed her that I would be leaving them some Linzess 290 mcg samples at the front desk. Stanton Kidney stated that the co-pay is too much for them to get a prescription for it.

## 2021-11-30 DIAGNOSIS — R700 Elevated erythrocyte sedimentation rate: Secondary | ICD-10-CM | POA: Diagnosis not present

## 2021-12-17 DIAGNOSIS — M25579 Pain in unspecified ankle and joints of unspecified foot: Secondary | ICD-10-CM | POA: Diagnosis not present

## 2021-12-17 DIAGNOSIS — K5909 Other constipation: Secondary | ICD-10-CM | POA: Diagnosis not present

## 2021-12-17 DIAGNOSIS — M542 Cervicalgia: Secondary | ICD-10-CM | POA: Diagnosis not present

## 2021-12-17 DIAGNOSIS — M48061 Spinal stenosis, lumbar region without neurogenic claudication: Secondary | ICD-10-CM | POA: Diagnosis not present

## 2021-12-17 DIAGNOSIS — G894 Chronic pain syndrome: Secondary | ICD-10-CM | POA: Diagnosis not present

## 2021-12-22 DIAGNOSIS — R700 Elevated erythrocyte sedimentation rate: Secondary | ICD-10-CM | POA: Diagnosis not present

## 2021-12-28 ENCOUNTER — Encounter: Payer: Self-pay | Admitting: Surgery

## 2021-12-31 DIAGNOSIS — R059 Cough, unspecified: Secondary | ICD-10-CM | POA: Diagnosis not present

## 2021-12-31 DIAGNOSIS — Z03818 Encounter for observation for suspected exposure to other biological agents ruled out: Secondary | ICD-10-CM | POA: Diagnosis not present

## 2021-12-31 DIAGNOSIS — R051 Acute cough: Secondary | ICD-10-CM | POA: Diagnosis not present

## 2021-12-31 DIAGNOSIS — J42 Unspecified chronic bronchitis: Secondary | ICD-10-CM | POA: Diagnosis not present

## 2021-12-31 DIAGNOSIS — R509 Fever, unspecified: Secondary | ICD-10-CM | POA: Diagnosis not present

## 2021-12-31 DIAGNOSIS — B338 Other specified viral diseases: Secondary | ICD-10-CM | POA: Diagnosis not present

## 2022-01-05 ENCOUNTER — Ambulatory Visit: Payer: Medicare Other | Admitting: Podiatry

## 2022-01-08 ENCOUNTER — Other Ambulatory Visit: Payer: Self-pay | Admitting: Internal Medicine

## 2022-01-11 ENCOUNTER — Telehealth: Payer: Self-pay

## 2022-01-11 NOTE — Telephone Encounter (Signed)
See previous message.  Please approve inhaler that is cheaper (brand)

## 2022-01-11 NOTE — Telephone Encounter (Signed)
[facility] stating that [employer] has a lower copay for brand name Flovent inhaler. Would Dr. Harrington Challenger like to send a new Rx for brand name DAW? Please address

## 2022-01-12 ENCOUNTER — Other Ambulatory Visit: Payer: Self-pay

## 2022-01-12 MED ORDER — FLOVENT HFA 44 MCG/ACT IN AERO: 2.0000 | INHALATION_SPRAY | Freq: Two times a day (BID) | RESPIRATORY_TRACT | 12 refills | Status: DC

## 2022-01-12 MED ORDER — FLOVENT HFA 110 MCG/ACT IN AERO: 2.0000 | INHALATION_SPRAY | Freq: Two times a day (BID) | RESPIRATORY_TRACT | 12 refills | Status: DC

## 2022-01-12 NOTE — Telephone Encounter (Deleted)
Flovent MD sent in per Dr. Harrington Challenger.

## 2022-01-12 NOTE — Telephone Encounter (Addendum)
Pt is using generic Flovent/ Fluticasone.... will send in new RX for brand Flovent which is preferred by the pts formulary.

## 2022-01-24 DIAGNOSIS — R700 Elevated erythrocyte sedimentation rate: Secondary | ICD-10-CM | POA: Diagnosis not present

## 2022-01-27 ENCOUNTER — Ambulatory Visit: Payer: Medicare Other | Admitting: Podiatry

## 2022-01-27 ENCOUNTER — Ambulatory Visit: Payer: Medicare Other

## 2022-01-27 ENCOUNTER — Encounter: Payer: Self-pay | Admitting: Podiatry

## 2022-01-27 ENCOUNTER — Other Ambulatory Visit: Payer: Self-pay

## 2022-01-27 VITALS — Temp 97.8°F

## 2022-01-27 DIAGNOSIS — L97511 Non-pressure chronic ulcer of other part of right foot limited to breakdown of skin: Secondary | ICD-10-CM | POA: Diagnosis not present

## 2022-01-27 DIAGNOSIS — M2031 Hallux varus (acquired), right foot: Secondary | ICD-10-CM | POA: Diagnosis not present

## 2022-01-27 MED ORDER — DOXYCYCLINE HYCLATE 100 MG PO TABS: 100.0000 mg | ORAL_TABLET | Freq: Two times a day (BID) | ORAL | 0 refills | Status: DC

## 2022-01-27 NOTE — Progress Notes (Signed)
°Subjective:  °Patient ID: Albert Wade, male    DOB: 05/22/1946,  [name]: [id number] ° °Chief Complaint  °Patient presents with  ° Toe Pain  °  "My big toe is oozing pus, right foot.  My toenais is coming off too."  ° ° °75 y.o. male presents for wound care.  Patient presents with right dorsal hallux ulceration limited to breakdown of the skin.  Patient states that it has been draining.  Patient had previous surgery done by Dr. Hyatt.  He has hammertoe contracture leading to a lot of pressure on the dorsal aspect of the toe that could lead to rubbing and therefore the ulceration.  He is not a diabetic.  He has not seen anyone else prior to seeing me.  He denies any other acute complaints.  He wanted to get this evaluated.  He is ambulating in his [employer] shoes. ° ° °Review of Systems: Negative except as noted in the HPI. Denies N/V/F/Ch. ° °Past Medical History:  °Diagnosis Date  ° Acute bronchitis 01/01/2015  ° Acute upper respiratory infection 05/19/2016  ° Anginal pain (HCC)   ° Anxiety   ° Arthritis   ° back,wrists,hx. 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/2016  ° Confusion 04/27/2017  ° COPD (chronic obstructive pulmonary disease) (HCC)   ° Depression   ° Encounter for preventative adult health care exam with abnormal findings 12/20/2013  ° Fever 04/27/2017  ° GERD (gastroesophageal reflux disease)   ° Gynecomastia 05/12/2015  ° H/O hiatal hernia   ° pts wife states pt does not have   ° Heart murmur   ° History of kidney stones   ° HTN (hypertension)   ° Hypercholesteremia   ° Hyperglycemia 05/19/2016  ° HYPERLIPIDEMIA 04/29/2008  ° Qualifier: Diagnosis of  By: Thornburg, Elizabeth    ° Impaired glucose tolerance  12/20/2013  ° Insomnia 04/27/2017  ° Lumbosacral radiculopathy 10/14/2016  ° MI (myocardial infarction) (HCC)   ° mid-'90's  ° MYOCARDIAL INFARCTION 04/29/2008  ° Qualifier: History of  By: Thornburg, Elizabeth    ° OSA (obstructive sleep apnea) 05/13/2011  ° Split night study 2011:  AHI 21/hr with obstructive and central events.  Placed on cpap and titrated to 13cm with reasonable control, but attempts to increase pressure increased the number of central events.  Medium resmed quattro full face mask used.    ° Peripheral neuropathy   ° peripheral neuropathy  ° Phimosis/adherent prepuce 10/21/2013  ° Pneumonia   ° Prostate cancer (HCC)   ° Shortness of breath 11/05/2010  ° Qualifier: Diagnosis of  By: Reiss, RN, BSN, Jacquelyn    ° Sleep apnea   ° mild-no cpap, unable to tolerate  ° Squamous cell carcinoma of skin 10/29/2020  ° right mid volar forearm  ° Umbilical hernia   ° Umbilical hernia without obstruction and without gangrene 05/19/2016  ° URI (upper respiratory infection) 04/27/2017  ° UTI (urinary tract infection) 12/01/2016  ° Varicose veins   ° Varicose veins of bilateral lower extremities with other complications 10/14/2016  ° Wheezing 01/01/2015  ° ° °Current Outpatient Medications:  °  doxycycline (VIBRA-TABS)   100 MG tablet, Take 1 tablet (100 mg total) by mouth 2 (two) times daily., Disp: 28 tablet, Rfl: 0 °  albuterol (VENTOLIN HFA) 108 (90 Base) MCG/ACT inhaler, INHALE 2 PUFFS INTO THE LUNGS 4 TIMES DAILY AS NEEDED, Disp: 8.5 g, Rfl: 2 °  ALPRAZolam (XANAX) 0.5 MG tablet, Take 0.5 mg by mouth 2 (two) times daily as needed for anxiety., Disp: , Rfl:  °  amLODipine (NORVASC) 2.5 MG tablet, Take 1 tablet (2.5 mg total) by mouth daily., Disp: 90 tablet, Rfl: 3 °  aspirin 81 MG tablet, Take 81 mg by mouth daily., Disp: , Rfl:  °  blood glucose meter kit and supplies KIT, Dispense based on patient and insurance preference. Use up to four times daily as directed., Disp: 1 each, Rfl: 0 °  DULoxetine (CYMBALTA) 60 MG  capsule, Take 60 mg by mouth 2 (two) times daily. , Disp: , Rfl:  °  FLOVENT HFA 44 MCG/ACT inhaler, Inhale 2 puffs into the lungs 2 (two) times daily. As needed, Disp: 1 each, Rfl: 12 °  furosemide (LASIX) 40 MG tablet, Take 1 tablet (40 mg total) by mouth 2 (two) times daily., Disp: 180 tablet, Rfl: 3 °  omeprazole (PRILOSEC) 20 MG capsule, Take 1 capsule (20 mg total) by mouth 2 (two) times daily., Disp: 180 capsule, Rfl: 3 °  oxyCODONE-acetaminophen (PERCOCET) 10-325 MG tablet, Take 1 tablet by mouth every 4 (four) hours as needed for pain. (Patient taking differently: Take 1-2 tablets by mouth every 4 (four) hours as needed for pain. Max 8 per day), Disp: 30 tablet, Rfl: 0 °  potassium chloride (KLOR-CON) 10 MEQ tablet, Take 2 tablets (20 mEq total) by mouth daily., Disp: 180 tablet, Rfl: 3 °  simvastatin (ZOCOR) 40 MG tablet, Take 1 tablet (40 mg total) by mouth at bedtime., Disp: 90 tablet, Rfl: 3 °  traZODone (DESYREL) 50 MG tablet, Take 1 tablet (50 mg total) by mouth at bedtime as needed for sleep., Disp: 30 tablet, Rfl: 0 ° °Social History  ° °Tobacco Use  °Smoking Status Former  ° Packs/day: 0.50  ° Years: 40.00  ° Pack years: 20.00  ° Types: Cigarettes  ° Quit date: 12/27/1995  ° Years since quitting: 26.1  °Smokeless Tobacco Never  ° ° °Allergies  °Allergen Reactions  ° Cyclobenzaprine   °  agitation   ° Seroquel [Quetiapine] Hives  ° °Objective:  ° °Vitals:  ° 01/27/22 1355  °Temp: 97.8 °F (36.6 °C)  ° °There is no height or weight on file to calculate BMI. °Constitutional Well developed. °Well nourished.  °Vascular Dorsalis pedis pulses palpable bilaterally. °Posterior tibial pulses palpable bilaterally. °Capillary refill normal to all digits.  °No cyanosis or clubbing noted. °Pedal hair growth normal.  °Neurologic Normal speech. °Oriented to person, place, and time. °Protective sensation absent  °Dermatologic Wound Location: Right dorsal hallux ulceration limited to the breakdown of the skin.  Mild  erythema noted circumferentially around the toe.  Does not probe down to deep tissue.  Hammertoe contractures/hallux malleus noted of the right great toe. °Wound Base: Mixed Granular/Fibrotic °Peri-wound: Reddened °Exudate: Scant/small amount Serosanguinous exudate °Wound Measurements: °-See below  °Orthopedic: No pain to palpation either foot.  ° °Radiographs: None °Assessment:  ° °1. Skin ulcer of right great toe, limited to breakdown of skin (HCC)   °2. Hallux malleus of right foot   ° °Plan:  °Patient was evaluated and treated and all questions answered. ° °Ulcer right hallux ulceration limited to the breakdown   the breakdown of the skin with underlying hallux malleus -Debridement as below. -Dressed with Betadine wet-to-dry, DSD. -Continue off-loading with surgical shoe. -Given the nature of the toe contracture and hallux malleus patient is a high risk of developing further complication of losing the toe.  I discussed with the patient he states understanding.  Procedure: Excisional Debridement of Wound Tool: Sharp chisel blade/tissue nipper Rationale: Removal of non-viable soft tissue from the wound to promote healing.  Anesthesia: none Pre-Debridement Wound Measurements: 0.5 cm x 0.8 cm x 0.2 cm  Post-Debridement Wound Measurements: 0.6 cm x 0.8 cm x 0.2 cm  Type of Debridement: Sharp Excisional Tissue Removed: Non-viable soft tissue Blood loss: Minimal (<50cc) Depth of Debridement: subcutaneous tissue. Technique: Sharp excisional debridement to bleeding, viable wound base.  Wound Progress: This is my initial evaluation of continue to monitor the progression of the wound Site healing conversation 7 Dressing: Dry, sterile, compression dressing. Disposition: Patient tolerated procedure well. Patient to return in 1 week for follow-up.  No follow-ups on file.

## 2022-02-15 ENCOUNTER — Ambulatory Visit: Payer: Medicare Other | Admitting: Podiatry

## 2022-02-15 ENCOUNTER — Other Ambulatory Visit: Payer: Self-pay

## 2022-02-15 DIAGNOSIS — L97511 Non-pressure chronic ulcer of other part of right foot limited to breakdown of skin: Secondary | ICD-10-CM | POA: Diagnosis not present

## 2022-02-17 NOTE — Progress Notes (Signed)
°Subjective:  °Patient ID: Albert Wade, male    DOB: 06/26/1946,  [name]: [id number] ° °Chief Complaint  °Patient presents with  ° Foot Ulcer  °  Right great toe wound   ° ° °76 y.o. male presents for wound care.  Patient presents for follow-up of right great toe ulceration.  Patient states that he is doing okay.  He has been doing Betadine wet-to-dry dressing changes.  He once he states he feels a lot better not a sore.  He denies any other acute complaints. ° ° °Review of Systems: Negative except as noted in the HPI. Denies N/V/F/Ch. ° °Past Medical History:  °Diagnosis Date  ° Acute bronchitis 01/01/2015  ° Acute upper respiratory infection 05/19/2016  ° Anginal pain (HCC)   ° Anxiety   ° Arthritis   ° back,wrists,hx. 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/2016  ° Confusion 04/27/2017  ° COPD (chronic obstructive pulmonary disease) (HCC)   ° Depression   ° Encounter for preventative adult health care exam with abnormal findings 12/20/2013  ° Fever 04/27/2017  ° GERD (gastroesophageal reflux disease)   ° Gynecomastia 05/12/2015  ° H/O hiatal hernia   ° pts wife states pt does not have   ° Heart murmur   ° History of kidney stones   ° HTN (hypertension)   ° Hypercholesteremia   ° Hyperglycemia 05/19/2016  ° HYPERLIPIDEMIA 04/29/2008  ° Qualifier: Diagnosis of  By: Thornburg, Elizabeth    ° Impaired glucose tolerance 12/20/2013  ° Insomnia 04/27/2017  ° Lumbosacral radiculopathy 10/14/2016  ° MI (myocardial infarction) (HCC)   ° mid-'90's  ° MYOCARDIAL INFARCTION 04/29/2008  ° Qualifier: History of  By: Thornburg, Elizabeth    ° OSA (obstructive sleep apnea) 05/13/2011  ° Split night study 2011:  AHI 21/hr with obstructive  and central events.  Placed on cpap and titrated to 13cm with reasonable control, but attempts to increase pressure increased the number of central events.  Medium resmed quattro full face mask used.    ° Peripheral neuropathy   ° peripheral neuropathy  ° Phimosis/adherent prepuce 10/21/2013  ° Pneumonia   ° Prostate cancer (HCC)   ° Shortness of breath 11/05/2010  ° Qualifier: Diagnosis of  By: Reiss, RN, BSN, Jacquelyn    ° Sleep apnea   ° mild-no cpap, unable to tolerate  ° Squamous cell carcinoma of skin 10/29/2020  ° right mid volar forearm  ° Umbilical hernia   ° Umbilical hernia without obstruction and without gangrene 05/19/2016  ° URI (upper respiratory infection) 04/27/2017  ° UTI (urinary tract infection) 12/01/2016  ° Varicose veins   ° Varicose veins of bilateral lower extremities with other complications 10/14/2016  ° Wheezing 01/01/2015  ° ° °Current Outpatient Medications:  °  albuterol (VENTOLIN HFA) 108 (90 Base) MCG/ACT inhaler, INHALE 2 PUFFS INTO THE LUNGS 4 TIMES DAILY AS NEEDED, Disp: 8.5 g, Rfl: 2 °  ALPRAZolam (XANAX) 0.5 MG tablet, Take 0.5 mg by mouth 2 (two) times daily as needed for anxiety., Disp: , Rfl:  °  amLODipine (NORVASC) 2.5 MG tablet, Take 1 tablet (2.5 mg total) by   mouth daily., Disp: 90 tablet, Rfl: 3 °  aspirin 81 MG tablet, Take 81 mg by mouth daily., Disp: , Rfl:  °  blood glucose meter kit and supplies KIT, Dispense based on patient and insurance preference. Use up to four times daily as directed., Disp: 1 each, Rfl: 0 °  doxycycline (VIBRA-TABS) 100 MG tablet, Take 1 tablet (100 mg total) by mouth 2 (two) times daily., Disp: 28 tablet, Rfl: 0 °  DULoxetine (CYMBALTA) 60 MG capsule, Take 60 mg by mouth 2 (two) times daily. , Disp: , Rfl:  °  FLOVENT HFA 44 MCG/ACT inhaler, Inhale 2 puffs into the lungs 2 (two) times daily. As needed, Disp: 1 each, Rfl: 12 °  furosemide (LASIX) 40 MG tablet, Take 1 tablet (40 mg total) by mouth 2 (two) times daily., Disp: 180 tablet, Rfl: 3 °   omeprazole (PRILOSEC) 20 MG capsule, Take 1 capsule (20 mg total) by mouth 2 (two) times daily., Disp: 180 capsule, Rfl: 3 °  oxyCODONE-acetaminophen (PERCOCET) 10-325 MG tablet, Take 1 tablet by mouth every 4 (four) hours as needed for pain. (Patient taking differently: Take 1-2 tablets by mouth every 4 (four) hours as needed for pain. Max 8 per day), Disp: 30 tablet, Rfl: 0 °  potassium chloride (KLOR-CON) 10 MEQ tablet, Take 2 tablets (20 mEq total) by mouth daily., Disp: 180 tablet, Rfl: 3 °  simvastatin (ZOCOR) 40 MG tablet, Take 1 tablet (40 mg total) by mouth at bedtime., Disp: 90 tablet, Rfl: 3 °  traZODone (DESYREL) 50 MG tablet, Take 1 tablet (50 mg total) by mouth at bedtime as needed for sleep., Disp: 30 tablet, Rfl: 0 ° °Social History  ° °Tobacco Use  °Smoking Status Former  ° Packs/day: 0.50  ° Years: 40.00  ° Pack years: 20.00  ° Types: Cigarettes  ° Quit date: 12/27/1995  ° Years since quitting: 26.1  °Smokeless Tobacco Never  ° ° °Allergies  °Allergen Reactions  ° Cyclobenzaprine   °  agitation   ° Seroquel [Quetiapine] Hives  ° °Objective:  ° °There were no vitals filed for this visit. ° °There is no height or weight on file to calculate BMI. °Constitutional Well developed. °Well nourished.  °Vascular Dorsalis pedis pulses palpable bilaterally. °Posterior tibial pulses palpable bilaterally. °Capillary refill normal to all digits.  °No cyanosis or clubbing noted. °Pedal hair growth normal.  °Neurologic Normal speech. °Oriented to person, place, and time. °Protective sensation absent  °Dermatologic Wound Location: Right dorsal hallux ulceration limited to the breakdown of the skin.  Mild erythema noted circumferentially around the toe.  Does not probe down to deep tissue.  Hammertoe contractures/hallux malleus noted of the right great toe. °Wound Base: Mixed Granular/Fibrotic °Peri-wound: Reddened °Exudate: Scant/small amount Serosanguinous exudate °Wound Measurements: °-See below  °Orthopedic: No  pain to palpation either foot.  ° °Radiographs: None °Assessment:  ° °No diagnosis found. ° °Plan:  °Patient was evaluated and treated and all questions answered. ° °Ulcer right hallux ulceration limited to the breakdown of the skin with underlying hallux malleus °-Debridement as below. °-Dressed with Betadine wet-to-dry, DSD. °-Continue off-loading with surgical shoe. °-Given the nature of the toe contracture and hallux malleus patient is a high risk of developing further complication of losing the toe.  I discussed with the patient he states understanding. ° °Procedure: Excisional Debridement of Wound~stagnant °Tool: Sharp chisel blade/tissue nipper °Rationale: Removal of non-viable soft tissue from the wound to promote healing.  °Anesthesia: none °Pre-Debridement Wound Measurements: 0.5 cm x   0.8 cm x 0.2 cm  °Post-Debridement Wound Measurements: 0.6 cm x 0.8 cm x 0.2 cm  °Type of Debridement: Sharp Excisional °Tissue Removed: Non-viable soft tissue °Blood loss: Minimal (<50cc) °Depth of Debridement: subcutaneous tissue. °Technique: Sharp excisional debridement to bleeding, viable wound base.  °Wound Progress: Clinically the wound measurements appears to be about the same. °Dressing: Dry, sterile, compression dressing. °Disposition: Patient tolerated procedure well. Patient to return in 1 week for follow-up. ° °No follow-ups on file. ° °  °

## 2022-02-21 DIAGNOSIS — G894 Chronic pain syndrome: Secondary | ICD-10-CM | POA: Diagnosis not present

## 2022-02-21 DIAGNOSIS — M47812 Spondylosis without myelopathy or radiculopathy, cervical region: Secondary | ICD-10-CM | POA: Diagnosis not present

## 2022-02-21 DIAGNOSIS — M542 Cervicalgia: Secondary | ICD-10-CM | POA: Diagnosis not present

## 2022-02-21 DIAGNOSIS — M25579 Pain in unspecified ankle and joints of unspecified foot: Secondary | ICD-10-CM | POA: Diagnosis not present

## 2022-02-21 DIAGNOSIS — K5909 Other constipation: Secondary | ICD-10-CM | POA: Diagnosis not present

## 2022-02-21 DIAGNOSIS — M48061 Spinal stenosis, lumbar region without neurogenic claudication: Secondary | ICD-10-CM | POA: Diagnosis not present

## 2022-02-21 DIAGNOSIS — Z79899 Other long term (current) drug therapy: Secondary | ICD-10-CM | POA: Diagnosis not present

## 2022-03-09 DIAGNOSIS — M48061 Spinal stenosis, lumbar region without neurogenic claudication: Secondary | ICD-10-CM | POA: Diagnosis not present

## 2022-03-15 ENCOUNTER — Ambulatory Visit: Payer: Medicare Other | Admitting: Podiatry

## 2022-03-16 DIAGNOSIS — M47812 Spondylosis without myelopathy or radiculopathy, cervical region: Secondary | ICD-10-CM | POA: Diagnosis not present

## 2022-03-17 ENCOUNTER — Other Ambulatory Visit: Payer: Self-pay

## 2022-03-17 ENCOUNTER — Ambulatory Visit: Payer: Medicare Other | Admitting: Podiatry

## 2022-03-17 DIAGNOSIS — M19071 Primary osteoarthritis, right ankle and foot: Secondary | ICD-10-CM

## 2022-03-17 DIAGNOSIS — L989 Disorder of the skin and subcutaneous tissue, unspecified: Secondary | ICD-10-CM | POA: Diagnosis not present

## 2022-03-17 DIAGNOSIS — Z01818 Encounter for other preprocedural examination: Secondary | ICD-10-CM

## 2022-03-17 DIAGNOSIS — R700 Elevated erythrocyte sedimentation rate: Secondary | ICD-10-CM | POA: Diagnosis not present

## 2022-03-21 ENCOUNTER — Telehealth: Payer: Self-pay | Admitting: Urology

## 2022-03-21 NOTE — Telephone Encounter (Signed)
DOS - 04/18/22 ? ?ARTHRODESIS IPJ RIGHT --- [phone] ?EXC BENIGN LESION RIGHT --- [id number] ? ?[employer] EFFECTIVE DATE - 12/26/21 ? ?PLAN DEDUCTIBLE  - $0.00 ?OUT OF POCKET - $3,600.00 W/ $3,545.00 REMAINING ?COINSURANCE - 0% ?COPAY - $295.00 ? ? ?PER [employer] WEBSITE FOR CPT CODES 50413 AND 64383 Notification or Prior Authorization is not required for the requested services ? ?Decision ID #:[id number] ?

## 2022-03-22 NOTE — Progress Notes (Signed)
?Subjective:  ?Patient ID: Albert Wade, male    DOB: September 10, 1946,  [name]: [id number] ? ?Chief Complaint  ?Patient presents with  ? Foot Ulcer  ? ? ?76 y.o. male presents for wound care.  Patient presents for follow-up of right great toe ulceration.  Patient states that he is doing okay.  He states the wound looks about the same.  He would like to discuss surgical options at this time. ? ? ?Review of Systems: Negative except as noted in the HPI. Denies N/V/F/Ch. ? ?Past Medical History:  ?Diagnosis Date  ? Acute bronchitis 01/01/2015  ? Acute upper respiratory infection 05/19/2016  ? Anginal pain ([place])   ? Anxiety   ? Arthritis   ? back,wrists,hx. previous fractures  ? BPH (benign prostatic hypertrophy)   ? BRONCHITIS, CHRONIC 04/29/2008  ? Qualifier: Diagnosis of  By: Halford Chessman MD, Vineet    ? CAD 10/16/2009  ? Qualifier: Diagnosis of  By: Lynden Ang    ? CAD (coronary artery disease)   ? angioplasty   ? Chronic back pain   ? Chronic bronchitis   ? 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 Confusion 04/27/2017  ? COPD (chronic obstructive pulmonary disease) ([place])   ? Depression   ? Encounter for preventative adult health care exam with abnormal findings 12/20/2013  ? Fever 04/27/2017  ? GERD (gastroesophageal reflux disease)   ? Gynecomastia 05/12/2015  ? H/O hiatal hernia   ? pts wife states pt does not have   ? Heart murmur   ? History of kidney stones   ? HTN (hypertension)   ? Hypercholesteremia   ? Hyperglycemia 05/19/2016  ? HYPERLIPIDEMIA 04/29/2008  ? Qualifier: Diagnosis of  By: Jim Like    ? Impaired glucose tolerance 12/20/2013  ? Insomnia 04/27/2017  ? Lumbosacral radiculopathy 10/14/2016  ? MI (myocardial infarction) ([place])   ? mid-'90's  ? MYOCARDIAL INFARCTION 04/29/2008  ? Qualifier: History of  By: Jim Like    ? OSA (obstructive sleep apnea) 05/13/2011  ? Split night study 2011:  AHI 21/hr with obstructive and central events.  Placed on cpap and titrated to 13cm with reasonable  control, but attempts to increase pressure increased the number of central events.  Medium resmed quattro full face mask used.    ? Peripheral neuropathy   ? peripheral neuropathy  ? Phimosis/adherent prepuce 10/21/2013  ? Pneumonia   ? Prostate cancer ([place])   ? Shortness of breath 11/05/2010  ? Qualifier: Diagnosis of  By: Marilynne Halsted RN, BSN, [place]    ? Sleep apnea   ? mild-no cpap, unable to tolerate  ? Squamous cell carcinoma of skin 10/29/2020  ? right mid volar forearm  ? Umbilical hernia   ? Umbilical hernia without obstruction and without gangrene 05/19/2016  ? URI (upper respiratory infection) 04/27/2017  ? UTI (urinary tract infection) 12/01/2016  ? Varicose veins   ? Varicose veins of bilateral lower extremities with other complications 32/20/2542  ? Wheezing 01/01/2015  ? ? ?Current Outpatient Medications:  ?  albuterol (VENTOLIN HFA) 108 (90 Base) MCG/ACT inhaler, INHALE 2 PUFFS INTO THE LUNGS 4 TIMES DAILY AS NEEDED, Disp: 8.5 g, Rfl: 2 ?  ALPRAZolam (XANAX) 0.5 MG tablet, Take 0.5 mg by mouth 2 (two) times daily as needed for anxiety., Disp: , Rfl:  ?  amLODipine (NORVASC) 2.5 MG tablet, Take 1 tablet (2.5 mg total) by mouth daily., Disp: 90 tablet, Rfl: 3 ?  aspirin 81 MG tablet, Take 81 mg by  mouth daily., Disp: , Rfl:  ?  blood glucose meter kit and supplies KIT, Dispense based on patient and insurance preference. Use up to four times daily as directed., Disp: 1 each, Rfl: 0 ?  doxycycline (VIBRA-TABS) 100 MG tablet, Take 1 tablet (100 mg total) by mouth 2 (two) times daily., Disp: 28 tablet, Rfl: 0 ?  DULoxetine (CYMBALTA) 60 MG capsule, Take 60 mg by mouth 2 (two) times daily. , Disp: , Rfl:  ?  FLOVENT HFA 44 MCG/ACT inhaler, Inhale 2 puffs into the lungs 2 (two) times daily. As needed, Disp: 1 each, Rfl: 12 ?  furosemide (LASIX) 40 MG tablet, Take 1 tablet (40 mg total) by mouth 2 (two) times daily., Disp: 180 tablet, Rfl: 3 ?  omeprazole (PRILOSEC) 20 MG capsule, Take 1 capsule (20 mg total) by mouth  2 (two) times daily., Disp: 180 capsule, Rfl: 3 ?  oxyCODONE-acetaminophen (PERCOCET) 10-325 MG tablet, Take 1 tablet by mouth every 4 (four) hours as needed for pain. (Patient taking differently: Take 1-2 tablets by mouth every 4 (four) hours as needed for pain. Max 8 per day), Disp: 30 tablet, Rfl: 0 ?  potassium chloride (KLOR-CON) 10 MEQ tablet, Take 2 tablets (20 mEq total) by mouth daily., Disp: 180 tablet, Rfl: 3 ?  simvastatin (ZOCOR) 40 MG tablet, Take 1 tablet (40 mg total) by mouth at bedtime., Disp: 90 tablet, Rfl: 3 ?  traZODone (DESYREL) 50 MG tablet, Take 1 tablet (50 mg total) by mouth at bedtime as needed for sleep., Disp: 30 tablet, Rfl: 0 ? ?Social History  ? ?Tobacco Use  ?Smoking Status Former  ? Packs/day: 0.50  ? Years: 40.00  ? Pack years: 20.00  ? Types: Cigarettes  ? Quit date: 12/27/1995  ? Years since quitting: 26.2  ?Smokeless Tobacco Never  ? ? ?Allergies  ?Allergen Reactions  ? Cyclobenzaprine   ?  agitation   ? Seroquel [Quetiapine] Hives  ? ?Objective:  ? ?There were no vitals filed for this visit. ? ?There is no height or weight on file to calculate BMI. ?Constitutional Well developed. ?Well nourished.  ?Vascular Dorsalis pedis pulses palpable bilaterally. ?Posterior tibial pulses palpable bilaterally. ?Capillary refill normal to all digits.  ?No cyanosis or clubbing noted. ?Pedal hair growth normal.  ?Neurologic Normal speech. ?Oriented to person, place, and time. ?Protective sensation absent  ?Dermatologic Wound Location: Right dorsal hallux ulceration limited to the breakdown of the skin.  Mild erythema noted circumferentially around the toe.  Does not probe down to deep tissue.  Hammertoe contractures/hallux malleus noted of the right great toe. ?Wound Base: Mixed Granular/Fibrotic ?Peri-wound: Reddened ?Exudate: Scant/small amount Serosanguinous exudate ?Wound Measurements: ?-See below ?-Crepitus could be clinically appreciated due to the high right hallux IPJ.  Limited range of  motion noted at the joint due to the underlying contracture.  ?Orthopedic: No pain to palpation either foot.  ? ?Radiographs: None ?Assessment:  ? ?1. Arthritis of right foot   ?2. Benign skin lesion   ?3. Encounter for preoperative examination for general surgical procedure   ? ? ?Plan:  ?Patient was evaluated and treated and all questions answered. ? ?Ulcer right hallux ulceration limited to the breakdown of the skin with underlying hallux malleus ?-Debridement as below. ?-Dressed with Betadine wet-to-dry, DSD. ?-Continue off-loading with surgical shoe. ?-Given the nature of the toe contracture and hallux malleus patient is a high risk of developing further complication of losing the toe.  I discussed with the patient he states understanding. ?-I   discussed with him given the contracture he will benefit from hallux IPJ fusion with excision of benign skin lesion/ulceration.  Given that he has failed conservative treatment options and the wound is not decreasing because of underlying contracture I discussed with the patient that he will benefit from resecting the contracture and fusing the joint.  I discussed my preoperative intraoperative postoperative plan in extensive detail he states understand like to proceed with surgery. ?-I discussed with him that given that he is a diabetic is a high risk of wound complication including loss of toe however I discussed with him if the ulceration does not close in the osteomyelitis goes in the bone he will likely lose the toe regardless.  Patient states understand would like to salvage the toe by prophylactic means. ? ? ?No follow-ups on file. ? ?  ?

## 2022-03-30 ENCOUNTER — Telehealth: Payer: Self-pay | Admitting: *Deleted

## 2022-03-30 ENCOUNTER — Other Ambulatory Visit: Payer: Self-pay | Admitting: Podiatry

## 2022-03-30 MED ORDER — DOXYCYCLINE HYCLATE 100 MG PO TABS: 100.0000 mg | ORAL_TABLET | Freq: Two times a day (BID) | ORAL | 0 refills | Status: DC

## 2022-03-30 NOTE — Telephone Encounter (Signed)
"  My husband is scheduled to have surgery on 04/18/2022.  Dr. Posey Pronto said for [place] to call if we notice any pus.  We have noticed a yellow discharge from that toe.  I'm wondering if he will call him in an antibiotic."  He probably needs to see Dr. Posey Pronto.  I can get him in to see Dr. Posey Pronto tomorrow at 9:45 am.  "Okay, that would be great."  I'll send Dr. Posey Pronto a message and see if he wants to go ahead and start him on an antibiotic.  What pharmacy do you use?  "We use [facility]."   ? ?I scheduled him an appointment with Dr. Posey Pronto for 03/31/2022 at 9:45 am. ?

## 2022-03-31 ENCOUNTER — Inpatient Hospital Stay
Admission: EM | Admit: 2022-03-31 | Discharge: 2022-04-02 | DRG: 504 | Disposition: A | Discharge status: Home / Self Care | Payer: Medicare Other | Attending: Internal Medicine | Admitting: Internal Medicine

## 2022-03-31 ENCOUNTER — Other Ambulatory Visit: Payer: Self-pay

## 2022-03-31 ENCOUNTER — Inpatient Hospital Stay: Payer: Medicare Other

## 2022-03-31 ENCOUNTER — Ambulatory Visit: Payer: Medicare Other | Admitting: Podiatry

## 2022-03-31 DIAGNOSIS — Z85828 Personal history of other malignant neoplasm of skin: Secondary | ICD-10-CM

## 2022-03-31 DIAGNOSIS — Z87891 Personal history of nicotine dependence: Secondary | ICD-10-CM | POA: Diagnosis not present

## 2022-03-31 DIAGNOSIS — I10 Essential (primary) hypertension: Secondary | ICD-10-CM | POA: Diagnosis not present

## 2022-03-31 DIAGNOSIS — M2031 Hallux varus (acquired), right foot: Secondary | ICD-10-CM | POA: Diagnosis not present

## 2022-03-31 DIAGNOSIS — J449 Chronic obstructive pulmonary disease, unspecified: Secondary | ICD-10-CM | POA: Diagnosis present

## 2022-03-31 DIAGNOSIS — I110 Hypertensive heart disease with heart failure: Secondary | ICD-10-CM | POA: Diagnosis present

## 2022-03-31 DIAGNOSIS — Z83438 Family history of other disorder of lipoprotein metabolism and other lipidemia: Secondary | ICD-10-CM | POA: Diagnosis not present

## 2022-03-31 DIAGNOSIS — I5032 Chronic diastolic (congestive) heart failure: Secondary | ICD-10-CM | POA: Diagnosis present

## 2022-03-31 DIAGNOSIS — F418 Other specified anxiety disorders: Secondary | ICD-10-CM | POA: Diagnosis present

## 2022-03-31 DIAGNOSIS — M86171 Other acute osteomyelitis, right ankle and foot: Principal | ICD-10-CM | POA: Diagnosis present

## 2022-03-31 DIAGNOSIS — E7800 Pure hypercholesterolemia, unspecified: Secondary | ICD-10-CM | POA: Diagnosis present

## 2022-03-31 DIAGNOSIS — E663 Overweight: Secondary | ICD-10-CM | POA: Diagnosis present

## 2022-03-31 DIAGNOSIS — K219 Gastro-esophageal reflux disease without esophagitis: Secondary | ICD-10-CM | POA: Diagnosis present

## 2022-03-31 DIAGNOSIS — L97511 Non-pressure chronic ulcer of other part of right foot limited to breakdown of skin: Secondary | ICD-10-CM

## 2022-03-31 DIAGNOSIS — G4730 Sleep apnea, unspecified: Secondary | ICD-10-CM | POA: Diagnosis present

## 2022-03-31 DIAGNOSIS — L089 Local infection of the skin and subcutaneous tissue, unspecified: Principal | ICD-10-CM

## 2022-03-31 DIAGNOSIS — Z7982 Long term (current) use of aspirin: Secondary | ICD-10-CM

## 2022-03-31 DIAGNOSIS — E876 Hypokalemia: Secondary | ICD-10-CM | POA: Diagnosis present

## 2022-03-31 DIAGNOSIS — E785 Hyperlipidemia, unspecified: Secondary | ICD-10-CM | POA: Diagnosis present

## 2022-03-31 DIAGNOSIS — Z6826 Body mass index (BMI) 26.0-26.9, adult: Secondary | ICD-10-CM

## 2022-03-31 DIAGNOSIS — Z89411 Acquired absence of right great toe: Secondary | ICD-10-CM | POA: Diagnosis not present

## 2022-03-31 DIAGNOSIS — Z8546 Personal history of malignant neoplasm of prostate: Secondary | ICD-10-CM | POA: Diagnosis not present

## 2022-03-31 DIAGNOSIS — I252 Old myocardial infarction: Secondary | ICD-10-CM | POA: Diagnosis not present

## 2022-03-31 DIAGNOSIS — G8929 Other chronic pain: Secondary | ICD-10-CM | POA: Diagnosis present

## 2022-03-31 DIAGNOSIS — L03119 Cellulitis of unspecified part of limb: Secondary | ICD-10-CM | POA: Diagnosis not present

## 2022-03-31 DIAGNOSIS — I2510 Atherosclerotic heart disease of native coronary artery without angina pectoris: Secondary | ICD-10-CM | POA: Diagnosis present

## 2022-03-31 DIAGNOSIS — G4733 Obstructive sleep apnea (adult) (pediatric): Secondary | ICD-10-CM | POA: Diagnosis present

## 2022-03-31 DIAGNOSIS — L97519 Non-pressure chronic ulcer of other part of right foot with unspecified severity: Secondary | ICD-10-CM | POA: Diagnosis present

## 2022-03-31 DIAGNOSIS — Z825 Family history of asthma and other chronic lower respiratory diseases: Secondary | ICD-10-CM

## 2022-03-31 DIAGNOSIS — Z79899 Other long term (current) drug therapy: Secondary | ICD-10-CM | POA: Diagnosis not present

## 2022-03-31 DIAGNOSIS — M869 Osteomyelitis, unspecified: Secondary | ICD-10-CM | POA: Diagnosis present

## 2022-03-31 DIAGNOSIS — N400 Enlarged prostate without lower urinary tract symptoms: Secondary | ICD-10-CM | POA: Diagnosis present

## 2022-03-31 DIAGNOSIS — M19071 Primary osteoarthritis, right ankle and foot: Secondary | ICD-10-CM | POA: Diagnosis not present

## 2022-03-31 DIAGNOSIS — Z8042 Family history of malignant neoplasm of prostate: Secondary | ICD-10-CM

## 2022-03-31 DIAGNOSIS — L03115 Cellulitis of right lower limb: Secondary | ICD-10-CM | POA: Diagnosis present

## 2022-03-31 DIAGNOSIS — M545 Low back pain, unspecified: Secondary | ICD-10-CM | POA: Diagnosis not present

## 2022-03-31 DIAGNOSIS — G629 Polyneuropathy, unspecified: Secondary | ICD-10-CM | POA: Diagnosis present

## 2022-03-31 DIAGNOSIS — Z872 Personal history of diseases of the skin and subcutaneous tissue: Secondary | ICD-10-CM | POA: Diagnosis not present

## 2022-03-31 DIAGNOSIS — R600 Localized edema: Secondary | ICD-10-CM | POA: Diagnosis not present

## 2022-03-31 LAB — COMPREHENSIVE METABOLIC PANEL WITH GFR
ALT: 20 U/L (ref 0–44)
AST: 19 U/L (ref 15–41)
Albumin: 3.4 g/dL — ABNORMAL LOW (ref 3.5–5.0)
Alkaline Phosphatase: 111 U/L (ref 38–126)
Anion gap: 13 (ref 5–15)
BUN: 14 mg/dL (ref 8–23)
CO2: 32 mmol/L (ref 22–32)
Calcium: 8.9 mg/dL (ref 8.9–10.3)
Chloride: 94 mmol/L — ABNORMAL LOW (ref 98–111)
Creatinine, Ser: 0.96 mg/dL (ref 0.61–1.24)
GFR, Estimated: >60 mL/min
Glucose, Bld: 152 mg/dL — ABNORMAL HIGH (ref 70–99)
Potassium: 3.3 mmol/L — ABNORMAL LOW (ref 3.5–5.1)
Sodium: 139 mmol/L (ref 135–145)
Total Bilirubin: 0.6 mg/dL (ref 0.3–1.2)
Total Protein: 7.3 g/dL (ref 6.5–8.1)

## 2022-03-31 LAB — CBC WITH DIFFERENTIAL/PLATELET
Abs Immature Granulocytes: 0.02 10*3/uL (ref 0.00–0.07)
Basophils Absolute: 0 10*3/uL (ref 0.0–0.1)
Basophils Relative: 0 %
Eosinophils Absolute: 0.1 10*3/uL (ref 0.0–0.5)
Eosinophils Relative: 1 %
HCT: 34.3 % — ABNORMAL LOW (ref 39.0–52.0)
Hemoglobin: 11.1 g/dL — ABNORMAL LOW (ref 13.0–17.0)
Immature Granulocytes: 0 %
Lymphocytes Relative: 18 %
Lymphs Abs: 1.3 10*3/uL (ref 0.7–4.0)
MCH: 28.7 pg (ref 26.0–34.0)
MCHC: 32.4 g/dL (ref 30.0–36.0)
MCV: 88.6 fL (ref 80.0–100.0)
Monocytes Absolute: 0.4 10*3/uL (ref 0.1–1.0)
Monocytes Relative: 6 %
Neutro Abs: 5.3 10*3/uL (ref 1.7–7.7)
Neutrophils Relative %: 75 %
Platelets: 221 10*3/uL (ref 150–400)
RBC: 3.87 MIL/uL — ABNORMAL LOW (ref 4.22–5.81)
RDW: 12.2 % (ref 11.5–15.5)
WBC: 7.1 10*3/uL (ref 4.0–10.5)
nRBC: 0 % (ref 0.0–0.2)

## 2022-03-31 LAB — URINALYSIS, ROUTINE W REFLEX MICROSCOPIC
Bilirubin Urine: NEGATIVE
Glucose, UA: NEGATIVE mg/dL
Hgb urine dipstick: NEGATIVE
Ketones, ur: NEGATIVE mg/dL
Leukocytes,Ua: NEGATIVE
Nitrite: NEGATIVE
Protein, ur: NEGATIVE mg/dL
Specific Gravity, Urine: 1.004 — ABNORMAL LOW (ref 1.005–1.030)
pH: 6 (ref 5.0–8.0)

## 2022-03-31 LAB — SURGICAL PCR SCREEN
MRSA, PCR: NEGATIVE
Staphylococcus aureus: POSITIVE — AB

## 2022-03-31 LAB — LACTIC ACID, PLASMA
Lactic Acid, Venous: 1.8 mmol/L (ref 0.5–1.9)
Lactic Acid, Venous: 1.5 mmol/L (ref 0.5–1.9)

## 2022-03-31 LAB — PROCALCITONIN: Procalcitonin: <0.1 ng/mL

## 2022-03-31 MED ORDER — ONDANSETRON HCL 4 MG/2ML IJ SOLN: 4.0000 mg | Freq: Four times a day (QID) | INTRAMUSCULAR | Status: DC | PRN

## 2022-03-31 MED ORDER — POTASSIUM CHLORIDE CRYS ER 20 MEQ PO TBCR
20.0000 meq | EXTENDED_RELEASE_TABLET | Freq: Every day | ORAL | Status: DC
  Administered 2022-03-31 – 2022-04-02 (×2): 20 meq via ORAL
  Filled 2022-03-31 (×3): qty 1
  Filled 2022-03-31: qty 2

## 2022-03-31 MED ORDER — MORPHINE SULFATE (PF) 2 MG/ML IV SOLN
1.0000 mg | INTRAVENOUS | Status: DC | PRN
  Administered 2022-04-01 (×2): 1 mg via INTRAVENOUS
  Filled 2022-03-31 (×2): qty 1

## 2022-03-31 MED ORDER — ACETAMINOPHEN 325 MG PO TABS: 650.0000 mg | ORAL_TABLET | Freq: Four times a day (QID) | ORAL | Status: DC | PRN

## 2022-03-31 MED ORDER — OXYCODONE HCL 5 MG PO TABS
5.0000 mg | ORAL_TABLET | ORAL | Status: DC | PRN
  Administered 2022-04-01 – 2022-04-02 (×4): 5 mg via ORAL
  Filled 2022-03-31 (×4): qty 1

## 2022-03-31 MED ORDER — SODIUM CHLORIDE 0.9 % IV SOLN
1.0000 g | Freq: Once | INTRAVENOUS | Status: AC
  Administered 2022-03-31: 1 g via INTRAVENOUS
  Filled 2022-03-31: qty 10

## 2022-03-31 MED ORDER — OXYCODONE-ACETAMINOPHEN 10-325 MG PO TABS: 1.0000 | ORAL_TABLET | ORAL | Status: DC | PRN

## 2022-03-31 MED ORDER — ACETAMINOPHEN 650 MG RE SUPP: 650.0000 mg | Freq: Four times a day (QID) | RECTAL | Status: DC | PRN

## 2022-03-31 MED ORDER — VANCOMYCIN HCL 1500 MG/300ML IV SOLN
1500.0000 mg | Freq: Once | INTRAVENOUS | Status: AC
  Administered 2022-03-31: 1500 mg via INTRAVENOUS
  Filled 2022-03-31: qty 300

## 2022-03-31 MED ORDER — FUROSEMIDE 40 MG PO TABS
40.0000 mg | ORAL_TABLET | Freq: Two times a day (BID) | ORAL | Status: DC
  Administered 2022-03-31 – 2022-04-02 (×3): 40 mg via ORAL
  Filled 2022-03-31 (×3): qty 1

## 2022-03-31 MED ORDER — POLYETHYLENE GLYCOL 3350 17 G PO PACK: 17.0000 g | PACK | Freq: Every day | ORAL | Status: DC | PRN

## 2022-03-31 MED ORDER — TRAZODONE HCL 100 MG PO TABS
100.0000 mg | ORAL_TABLET | Freq: Every evening | ORAL | Status: DC | PRN
  Administered 2022-03-31 – 2022-04-01 (×2): 100 mg via ORAL
  Filled 2022-03-31 (×2): qty 1

## 2022-03-31 MED ORDER — AMLODIPINE BESYLATE 5 MG PO TABS
2.5000 mg | ORAL_TABLET | Freq: Every day | ORAL | Status: DC
  Administered 2022-03-31 – 2022-04-02 (×3): 2.5 mg via ORAL
  Filled 2022-03-31 (×3): qty 1

## 2022-03-31 MED ORDER — DULOXETINE HCL 30 MG PO CPEP
60.0000 mg | ORAL_CAPSULE | Freq: Two times a day (BID) | ORAL | Status: DC
  Administered 2022-03-31 – 2022-04-02 (×4): 60 mg via ORAL
  Filled 2022-03-31 (×4): qty 2

## 2022-03-31 MED ORDER — ONDANSETRON HCL 4 MG PO TABS: 4.0000 mg | ORAL_TABLET | Freq: Four times a day (QID) | ORAL | Status: DC | PRN

## 2022-03-31 MED ORDER — PANTOPRAZOLE SODIUM 40 MG PO TBEC
40.0000 mg | DELAYED_RELEASE_TABLET | Freq: Every day | ORAL | Status: DC
  Administered 2022-03-31 – 2022-04-02 (×2): 40 mg via ORAL
  Filled 2022-03-31 (×3): qty 1

## 2022-03-31 MED ORDER — CHLORHEXIDINE GLUCONATE CLOTH 2 % EX PADS: 6.0000 | MEDICATED_PAD | Freq: Every day | CUTANEOUS | Status: DC

## 2022-03-31 MED ORDER — TRAZODONE HCL 50 MG PO TABS: 50.0000 mg | ORAL_TABLET | Freq: Every evening | ORAL | Status: DC | PRN

## 2022-03-31 MED ORDER — SIMVASTATIN 20 MG PO TABS
40.0000 mg | ORAL_TABLET | Freq: Every day | ORAL | Status: DC
  Administered 2022-03-31 – 2022-04-01 (×2): 40 mg via ORAL
  Filled 2022-03-31 (×2): qty 2

## 2022-03-31 MED ORDER — ENOXAPARIN SODIUM 40 MG/0.4ML IJ SOSY
40.0000 mg | PREFILLED_SYRINGE | INTRAMUSCULAR | Status: DC
  Filled 2022-03-31 (×2): qty 0.4

## 2022-03-31 MED ORDER — ALBUTEROL SULFATE (2.5 MG/3ML) 0.083% IN NEBU: 3.0000 mL | INHALATION_SOLUTION | Freq: Four times a day (QID) | RESPIRATORY_TRACT | Status: DC | PRN

## 2022-03-31 MED ORDER — OXYCODONE-ACETAMINOPHEN 5-325 MG PO TABS
1.0000 | ORAL_TABLET | ORAL | Status: DC | PRN
  Administered 2022-03-31 – 2022-04-02 (×4): 1 via ORAL
  Filled 2022-03-31 (×4): qty 1

## 2022-03-31 MED ORDER — MUPIROCIN 2 % EX OINT
1.0000 "application " | TOPICAL_OINTMENT | Freq: Two times a day (BID) | CUTANEOUS | Status: DC
  Administered 2022-04-01 – 2022-04-02 (×3): 1 "application " via NASAL
  Filled 2022-03-31: qty 22

## 2022-03-31 MED ORDER — ALPRAZOLAM 0.5 MG PO TABS
0.5000 mg | ORAL_TABLET | Freq: Two times a day (BID) | ORAL | Status: DC | PRN
  Administered 2022-03-31: 0.5 mg via ORAL
  Filled 2022-03-31: qty 1

## 2022-03-31 NOTE — ED Triage Notes (Signed)
Pt with right great toe infection for 6-7 weeks. Pt states he has not been on antibiotics. Pt states he was told to come to the hospital to be admitted for a toe amputation. Pt states there is a hole in the top of the toe to his bone. Pt with dressing on toe at this time.  ?

## 2022-03-31 NOTE — ED Notes (Signed)
Informed RN bed assigned 

## 2022-03-31 NOTE — Hospital Course (Addendum)
Patient is a 76 year old male with past medical history of CAD, prostate cancer and diastolic heart failure who has had issues with a chronic wound on his right great toe for some time.  In the last few days, patient started developing some drainage from the toe wound as well as erythema going up his foot.  Patient started on antibiotic as outpatient.  After being seen by his podiatrist, patient was sent over to the emergency room on 4/6 for concerns of osteomyelitis.  Patient admitted to the hospitalist service and MRI notes osteomyelitis of the proximal and distal phalanges of the first digit with an oblique fracture through the distal aspect of the proximal phalanx, suspected pathologic fracture.  Soft tissue edema and skin irregularity about the first digit consistent with cellulitis.  Podiatry took patient for right first toe amputation at the first [facility].  Surgery went well without complications.  Patient seen following day and cleared for discharge.  He will go home with postoperative shoe and on oral antibiotics. ?

## 2022-03-31 NOTE — Assessment & Plan Note (Signed)
Stable, continue statin 

## 2022-03-31 NOTE — Assessment & Plan Note (Addendum)
MRI confirmed osteomyelitis.  Status post right first great toe amputation.  Postop, patient will keep wound wrapped.  Not allowed to get wet.  Podiatry will call patient on Monday 4/10 for follow-up appointment on Wednesday 4/12.  Weightbearing as tolerated while wearing boot. ?

## 2022-03-31 NOTE — H&P (Signed)
 " History and Physical    Patient: Albert Wade FMW:[id number] DOB: Jun 05, 1946 DOA: 03/31/2022 DOS: the patient was seen and examined on 03/31/2022 PCP: Norleen Lynwood LELON, MD  Patient coming from: Home  Chief Complaint:  Chief Complaint  Patient presents with   right great toe infection   HPI: Patient is a 76 year old male with past medical history of CAD, prostate cancer and diastolic heart failure who has had issues with a chronic wound on his right great toe for some time.  In the last few days, patient started developing some drainage from the toe wound as well as erythema going up his foot.  There is concern about cellulitis.  Patient had an antibiotic called and the day prior and then saw his podiatrist today.  Podiatrist was concerned about osteomyelitis and sent patient over to the emergency room.  In the emergency room, vitals look stable but wound looked concerning.  Podiatry plans to possibly amputate toe.  Patient admitted to the hospitalist service.  Patient complains of acute right toe pain as well as his chronic back pain.  Otherwise, he has no complaints.  His breathing comfortably.  Review of Systems: As mentioned in the history of present illness. All other systems reviewed and are negative. Past Medical History:  Diagnosis Date   Acute bronchitis 01/01/2015   Acute upper respiratory infection 05/19/2016   Anginal pain (HCC)    Anxiety    Arthritis    back,wrists,hx. previous fractures   BPH (benign prostatic hypertrophy)    BRONCHITIS, CHRONIC 04/29/2008   Qualifier: Diagnosis of  By: Shellia MD, Vineet     CAD 10/16/2009   Qualifier: Diagnosis of  By: Gardenia Pao     CAD (coronary artery disease)    angioplasty    Chronic back pain    Chronic bronchitis    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/2016   Confusion 04/27/2017   COPD (chronic obstructive pulmonary disease) (HCC)    Depression    Encounter for preventative adult health care exam with abnormal findings  12/20/2013   Fever 04/27/2017   GERD (gastroesophageal reflux disease)    Gynecomastia 05/12/2015   H/O hiatal hernia    pts wife states pt does not have    Heart murmur    History of kidney stones    HTN (hypertension)    Hypercholesteremia    Hyperglycemia 05/19/2016   HYPERLIPIDEMIA 04/29/2008   Qualifier: Diagnosis of  By: Clifford Norris     Impaired glucose tolerance 12/20/2013   Insomnia 04/27/2017   Lumbosacral radiculopathy 10/14/2016   MI (myocardial infarction) [facility])    mid-'90's   MYOCARDIAL INFARCTION 04/29/2008   Qualifier: History of  By: Clifford Norris     OSA (obstructive sleep apnea) 05/13/2011   Split night study 2011:  AHI 21/hr with obstructive and central events.  Placed on cpap and titrated to 13cm with reasonable control, but attempts to increase pressure increased the number of central events.  Medium resmed quattro full face mask used.     Peripheral neuropathy    peripheral neuropathy   Phimosis/adherent prepuce 10/21/2013   Pneumonia    Prostate cancer (HCC)    Shortness of breath 11/05/2010   Qualifier: Diagnosis of  By: Pixie, RN, BSN, Jacquelyn     Sleep apnea    mild-no cpap, unable to tolerate   Squamous cell carcinoma of skin 10/29/2020   right mid volar forearm   Umbilical hernia    Umbilical hernia without obstruction and  without gangrene 05/19/2016   URI (upper respiratory infection) 04/27/2017   UTI (urinary tract infection) 12/01/2016   Varicose veins    Varicose veins of bilateral lower extremities with other complications 10/14/2016   Wheezing 01/01/2015   Past Surgical History:  Procedure Laterality Date   ANGIOPLASTY     APPENDECTOMY     ARTERY BIOPSY Right 10/15/2021   Procedure: BIOPSY TEMPORAL ARTERY;  Surgeon: Tye Millet, DO;  Location: [facility] ORS;  Service: General;  Laterality: Right;   BACK SURGERY     x3   CARDIAC CATHETERIZATION     11'11   CIRCUMCISION N/A 10/21/2013   Procedure: CIRCUMCISION ADULT;  Surgeon: Alm GORMAN Fragmin, MD;  Location: [facility] ORS;  Service: Urology;  Laterality: N/A;   COLONOSCOPY WITH PROPOFOL  N/A 06/09/2021   Procedure: COLONOSCOPY WITH PROPOFOL ;  Surgeon: Therisa Bi, MD;  Location: [facility] ENDOSCOPY;  Service: Gastroenterology;  Laterality: N/A;   CYSTOSCOPY N/A 10/21/2013   Procedure: CYSTOSCOPY FLEXIBLE;  Surgeon: Alm GORMAN Fragmin, MD;  Location: [facility] ORS;  Service: Urology;  Laterality: N/A;   CYSTOSCOPY W/ URETERAL STENT PLACEMENT Right 06/01/2020   Procedure: CYSTOSCOPY WITH RETROGRADE PYELOGRAM/URETERAL STENT Exchange;  Surgeon: Penne Knee, MD;  Location: [facility] ORS;  Service: Urology;  Laterality: Right;   CYSTOSCOPY WITH BIOPSY Right 01/06/2020   Procedure: CYSTOSCOPY WITH BIOPSY;  Surgeon: Penne Knee, MD;  Location: [facility] ORS;  Service: Urology;  Laterality: Right;   CYSTOSCOPY/URETEROSCOPY/HOLMIUM LASER/STENT PLACEMENT Right 01/06/2020   Procedure: CYSTOSCOPY/URETEROSCOPY/HOLMIUM LASER/STENT PLACEMENT;  Surgeon: Penne Knee, MD;  Location: [facility] ORS;  Service: Urology;  Laterality: Right;   ESOPHAGOGASTRODUODENOSCOPY N/A 02/03/2021   Procedure: ESOPHAGOGASTRODUODENOSCOPY (EGD);  Surgeon: Therisa Bi, MD;  Location: [facility] ENDOSCOPY;  Service: Gastroenterology;  Laterality: N/A;   ESOPHAGOGASTRODUODENOSCOPY (EGD) WITH PROPOFOL  N/A 03/09/2021   Procedure: ESOPHAGOGASTRODUODENOSCOPY (EGD) WITH PROPOFOL ;  Surgeon: Therisa Bi, MD;  Location: [facility] ENDOSCOPY;  Service: Gastroenterology;  Laterality: N/A;   HERNIA REPAIR     INSERTION OF MESH N/A 06/17/2016   Procedure: INSERTION OF MESH;  Surgeon: Morene Olives, MD;  Location: [facility] ORS;  Service: General;  Laterality: N/A;   UMBILICAL HERNIA REPAIR N/A 06/17/2016   Procedure: REPAIR UMBILICAL HERNIA WITH MESH;  Surgeon: Morene Olives, MD;  Location: [facility] ORS;  Service: General;  Laterality: N/A;   WRIST SURGERY Right    Social History:  reports that he quit smoking about 26 years ago. His smoking use included cigarettes. He has a 20.00  pack-year smoking history. He has never used smokeless tobacco. He reports that he does not drink alcohol and does not use drugs.  Ambulates without assistance.  Lives at home with his family.  Allergies  Allergen Reactions   Cyclobenzaprine     agitation    Seroquel [Quetiapine] Hives    Family History  Problem Relation Age of Onset   Prostate cancer Father    Emphysema Father    Hyperlipidemia Father    Hyperlipidemia Mother     Prior to Admission medications   Medication Sig Start Date End Date Taking? Authorizing Provider  ALPRAZolam  (XANAX ) 0.5 MG tablet Take 0.5 mg by mouth 2 (two) times daily as needed for anxiety. 01/28/21  Yes [provider]  amLODipine  (NORVASC ) 2.5 MG tablet Take 1 tablet (2.5 mg total) by mouth daily. 09/16/21  Yes Okey Vina GAILS, MD  aspirin  81 MG tablet Take 81 mg by mouth daily.   Yes [provider]  doxycycline  (VIBRA -TABS) 100 MG tablet Take 100 mg by mouth 2 (two)  times daily.   Yes [provider]  DULoxetine  (CYMBALTA ) 60 MG capsule Take 60 mg by mouth 2 (two) times daily.  04/23/20  Yes [provider]  FLOVENT  HFA 44 MCG/ACT inhaler Inhale 2 puffs into the lungs 2 (two) times daily. As needed 01/12/22  Yes Okey Vina GAILS, MD  furosemide  (LASIX ) 40 MG tablet Take 1 tablet (40 mg total) by mouth 2 (two) times daily. 07/21/21  Yes Norleen Lynwood ORN, MD  omeprazole  (PRILOSEC) 20 MG capsule Take 1 capsule (20 mg total) by mouth 2 (two) times daily. 04/08/21 04/08/22 Yes Therisa Bi, MD  oxyCODONE -acetaminophen  (PERCOCET) 10-325 MG tablet Take 1 tablet by mouth every 4 (four) hours as needed for pain. Patient taking differently: Take 2 tablets by mouth 4 (four) times daily. Max 8 per day 01/06/20  Yes Penne Knee, MD  simvastatin  (ZOCOR ) 40 MG tablet Take 1 tablet (40 mg total) by mouth at bedtime. 09/20/21  Yes Okey Vina GAILS, MD  traZODone  (DESYREL ) 50 MG tablet Take 1 tablet (50 mg total) by mouth at bedtime as needed for  sleep. Patient taking differently: Take 150 mg by mouth at bedtime as needed for sleep. Take 2-3 tablets at bedtime as needed 10/16/21  Yes Fausto Burnard LABOR, DO  albuterol  (VENTOLIN  HFA) 108 (90 Base) MCG/ACT inhaler INHALE 2 PUFFS INTO THE LUNGS 4 TIMES DAILY AS NEEDED 01/08/22   Norleen Lynwood ORN, MD  blood glucose meter kit and supplies KIT Dispense based on patient and insurance preference. Use up to four times daily as directed. 10/16/21   Fausto Burnard LABOR, DO    Physical Exam: Vitals:   03/31/22 1501 03/31/22 1502  BP: (!) 153/80   Pulse: 78   Resp: 18   Temp: 98 F (36.7 C)   TempSrc: Oral   SpO2: 97%   Weight:  83.9 kg  Height:  5' 10 (1.778 m)   General: Alert and oriented x3, no acute distress HEENT: Normocephalic atraumatic, mucous membranes are moist Neck: Supple, no JVD Cardiovascular: Regular rate and rhythm, S1-S2 Lungs: Clear auscultation bilaterally Abdomen: Soft, nontender, nondistended, positive bowel sounds Extremities: Right great toe is wrapped.  Underneath the wrapping he has a large wound which has some minimal drainage.  He also has erythema going up to mid foot Neuro: No focal deficits Psychiatry: Patient is appropriate, no evidence of psychoses  Data Reviewed:  Labs reviewed.  MRI is pending. Lactic acid level on high end of normal at 1.8.  Mildly hypokalemic at 3.3.  Hemoglobin of 11.1.  Assessment and Plan: * Acute osteomyelitis of toe of right foot (HCC) Checking procalcitonin.  Checking MRI.  Chronic diastolic CHF (congestive heart failure) (HCC) Looks to be euvolemic.  Breathing comfortably.  COPD (chronic obstructive pulmonary disease) (HCC) Stable.  Not on home oxygen.  Breathing comfortably  Depression with anxiety Continue Cymbalta  at Xanax  plus nightly trazodone   Chronic back pain We will continue patient's Percocet for his back plus low-dose as needed IV morphine  for his toe.  HTN (hypertension) Continue home medications.  HLD  (hyperlipidemia) Stable, continue statin  Benign prostatic hyperplasia Stable continue meds  Overweight (BMI 25.0-29.9) Meets criteria BMI greater than 25      Advance Care Planning: Full code  Consults: Podiatry  Family Communication: Wife at the bedside  Severity of Illness: The appropriate patient status for this patient is INPATIENT. Inpatient status is judged to be reasonable and necessary in order to provide the required intensity of service to ensure the  patient's safety. The patient's presenting symptoms, physical exam findings, and initial radiographic and laboratory data in the context of their chronic comorbidities is felt to place them at high risk for further clinical deterioration. Furthermore, it is not anticipated that the patient will be medically stable for discharge from the hospital within 2 midnights of admission.   * I certify that at the point of admission it is my clinical judgment that the patient will require inpatient hospital care spanning beyond 2 midnights from the point of admission due to high intensity of service, high risk for further deterioration and high frequency of surveillance required.*  Author: Zanai Mallari K Aqil Goetting, MD 03/31/2022 6:30 PM  For on call review [email or web address].  "

## 2022-03-31 NOTE — Assessment & Plan Note (Addendum)
Stable, continued on his home medications. ?

## 2022-03-31 NOTE — ED Notes (Addendum)
First set of cultures sent to lab. 

## 2022-03-31 NOTE — Progress Notes (Signed)
PHARMACY -  BRIEF ANTIBIOTIC NOTE  ? ?Pharmacy has received consult(s) for vancomycin from an ED provider.  The patient's profile has been reviewed for ht/wt/allergies/indication/available labs.   ? ?One time order(s) placed for vancomycin 1500 mg IV x 1 ? ?Further antibiotics/pharmacy consults should be ordered by admitting physician if indicated.       ?                ?Thank you, ?Dallie Piles ?03/31/2022  3:38 PM ? ? ?

## 2022-03-31 NOTE — Progress Notes (Signed)
?Subjective:  ?Patient ID: Albert Wade, male    DOB: 31-Jul-1946,  [name]: [id number] ? ?Chief Complaint  ?Patient presents with  ? Foot Ulcer  ?  Right hallux toe wound ?Red swollen warm to the touch has drainage coming out the wound pt stated that it started Monday ?He is scheduled for surgery on the 24th   ? ? ?76 y.o. male presents for wound care.  Patient presents for follow-up of right great toe ulceration.  Patient states it has gotten red hot swollen and there is more drainage.  He was scheduled for surgery on the 24th for IPJ fusion to allow the soft tissue ulceration to heal.  However since it has regressed patient will need to be admitted for IV antibiotics.  He denies any nausea fever chills vomiting.  Denies any other acute complaints. ? ? ?Review of Systems: Negative except as noted in the HPI. Denies N/V/F/Ch. ? ?Past Medical History:  ?Diagnosis Date  ? Acute bronchitis 01/01/2015  ? Acute upper respiratory infection 05/19/2016  ? Anginal pain ([place])   ? Anxiety   ? Arthritis   ? back,wrists,hx. previous fractures  ? BPH (benign prostatic hypertrophy)   ? BRONCHITIS, CHRONIC 04/29/2008  ? Qualifier: Diagnosis of  By: Halford Chessman MD, Vineet    ? CAD 10/16/2009  ? Qualifier: Diagnosis of  By: Lynden Ang    ? CAD (coronary artery disease)   ? angioplasty   ? Chronic back pain   ? Chronic bronchitis   ? 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 Confusion 04/27/2017  ? COPD (chronic obstructive pulmonary disease) ([place])   ? Depression   ? Encounter for preventative adult health care exam with abnormal findings 12/20/2013  ? Fever 04/27/2017  ? GERD (gastroesophageal reflux disease)   ? Gynecomastia 05/12/2015  ? H/O hiatal hernia   ? pts wife states pt does not have   ? Heart murmur   ? History of kidney stones   ? HTN (hypertension)   ? Hypercholesteremia   ? Hyperglycemia 05/19/2016  ? HYPERLIPIDEMIA 04/29/2008  ? Qualifier: Diagnosis of  By: Jim Like    ? Impaired glucose tolerance 12/20/2013   ? Insomnia 04/27/2017  ? Lumbosacral radiculopathy 10/14/2016  ? MI (myocardial infarction) ([place])   ? mid-'90's  ? MYOCARDIAL INFARCTION 04/29/2008  ? Qualifier: History of  By: Jim Like    ? OSA (obstructive sleep apnea) 05/13/2011  ? Split night study 2011:  AHI 21/hr with obstructive and central events.  Placed on cpap and titrated to 13cm with reasonable control, but attempts to increase pressure increased the number of central events.  Medium resmed quattro full face mask used.    ? Peripheral neuropathy   ? peripheral neuropathy  ? Phimosis/adherent prepuce 10/21/2013  ? Pneumonia   ? Prostate cancer ([place])   ? Shortness of breath 11/05/2010  ? Qualifier: Diagnosis of  By: Marilynne Halsted RN, BSN, [place]    ? Sleep apnea   ? mild-no cpap, unable to tolerate  ? Squamous cell carcinoma of skin 10/29/2020  ? right mid volar forearm  ? Umbilical hernia   ? Umbilical hernia without obstruction and without gangrene 05/19/2016  ? URI (upper respiratory infection) 04/27/2017  ? UTI (urinary tract infection) 12/01/2016  ? Varicose veins   ? Varicose veins of bilateral lower extremities with other complications 18/84/1660  ? Wheezing 01/01/2015  ? ? ?Current Outpatient Medications:  ?  albuterol (VENTOLIN HFA) 108 (90 Base) MCG/ACT inhaler, INHALE 2  PUFFS INTO THE LUNGS 4 TIMES DAILY AS NEEDED, Disp: 8.5 g, Rfl: 2 ?  ALPRAZolam (XANAX) 0.5 MG tablet, Take 0.5 mg by mouth 2 (two) times daily as needed for anxiety., Disp: , Rfl:  ?  amLODipine (NORVASC) 2.5 MG tablet, Take 1 tablet (2.5 mg total) by mouth daily., Disp: 90 tablet, Rfl: 3 ?  aspirin 81 MG tablet, Take 81 mg by mouth daily., Disp: , Rfl:  ?  blood glucose meter kit and supplies KIT, Dispense based on patient and insurance preference. Use up to four times daily as directed., Disp: 1 each, Rfl: 0 ?  doxycycline (VIBRA-TABS) 100 MG tablet, Take 1 tablet (100 mg total) by mouth 2 (two) times daily., Disp: 28 tablet, Rfl: 0 ?  DULoxetine (CYMBALTA) 60 MG capsule, Take  60 mg by mouth 2 (two) times daily. , Disp: , Rfl:  ?  FLOVENT HFA 44 MCG/ACT inhaler, Inhale 2 puffs into the lungs 2 (two) times daily. As needed, Disp: 1 each, Rfl: 12 ?  furosemide (LASIX) 40 MG tablet, Take 1 tablet (40 mg total) by mouth 2 (two) times daily., Disp: 180 tablet, Rfl: 3 ?  omeprazole (PRILOSEC) 20 MG capsule, Take 1 capsule (20 mg total) by mouth 2 (two) times daily., Disp: 180 capsule, Rfl: 3 ?  oxyCODONE-acetaminophen (PERCOCET) 10-325 MG tablet, Take 1 tablet by mouth every 4 (four) hours as needed for pain. (Patient taking differently: Take 1-2 tablets by mouth every 4 (four) hours as needed for pain. Max 8 per day), Disp: 30 tablet, Rfl: 0 ?  potassium chloride (KLOR-CON) 10 MEQ tablet, Take 2 tablets (20 mEq total) by mouth daily., Disp: 180 tablet, Rfl: 3 ?  simvastatin (ZOCOR) 40 MG tablet, Take 1 tablet (40 mg total) by mouth at bedtime., Disp: 90 tablet, Rfl: 3 ?  traZODone (DESYREL) 50 MG tablet, Take 1 tablet (50 mg total) by mouth at bedtime as needed for sleep., Disp: 30 tablet, Rfl: 0 ? ?Social History  ? ?Tobacco Use  ?Smoking Status Former  ? Packs/day: 0.50  ? Years: 40.00  ? Pack years: 20.00  ? Types: Cigarettes  ? Quit date: 12/27/1995  ? Years since quitting: 26.2  ?Smokeless Tobacco Never  ? ? ?Allergies  ?Allergen Reactions  ? Cyclobenzaprine   ?  agitation   ? Seroquel [Quetiapine] Hives  ? ?Objective:  ? ?There were no vitals filed for this visit. ? ?There is no height or weight on file to calculate BMI. ?Constitutional Well developed. ?Well nourished.  ?Vascular Dorsalis pedis pulses faintly palpable bilaterally. ?Posterior tibial pulses faintly palpable bilaterally. ?Capillary refill normal to all digits.  ?No cyanosis or clubbing noted. ?Pedal hair growth normal.  ?Neurologic Normal speech. ?Oriented to person, place, and time. ?Protective sensation absent  ?Dermatologic Wound Location: Right dorsal hallux ulceration limited to the breakdown of the skin.  Cellulitis  noted circumferential around the toe up to the metatarsophalangeal joint.  Some purulent drainage noted.  Probing down to bone.  No malodor present  ?Orthopedic: No pain to palpation either foot.  ? ? ? ? ? ? ?Radiographs: None ?Assessment:  ? ?1. Hallux malleus of right foot   ?2. Skin ulcer of right great toe, limited to breakdown of skin ([place])   ?3. Cellulitis of foot   ? ? ?Plan:  ?Patient was evaluated and treated and all questions answered. ? ?Ulcer right hallux ulceration probing down to bone with some purulent drainage expressed with redness up to the metatarsal phalangeal joint ?-  Clinically the patient's wound has regressed considerably.  Therefore I believe patient will benefit from admission into the hospital for IV antibiotics MRI to assess for osteomyelitis. ?-He will need a surgical amputation of the toe as an inpatient.  I will discuss this with the on-call doctor for [facility]. ?-Weightbearing as tolerated in a surgical shoe ?- He states that he has some errands to run at home to take care of his wife then he will go to the emergency room to be admitted.  I discussed with him to go to the emergency room right away ?-Patient is a high risk of worsening infection and losing the foot/leg.  I discussed with the patient he states understanding. ? ?No follow-ups on file. ? ?  ?

## 2022-03-31 NOTE — Assessment & Plan Note (Signed)
Continue Cymbalta at Xanax plus nightly trazodone ?

## 2022-03-31 NOTE — Assessment & Plan Note (Signed)
Meets criteria BMI greater than 25 

## 2022-03-31 NOTE — ED Provider Notes (Signed)
? ?[facility] ?Provider Note ? ? ? Event Date/Time  ? First MD Initiated Contact with Patient 03/31/22 1517   ?  (approximate) ? ? ?History  ? ?right great toe infection ? ? ?HPI ? ?Albert Wade is a 76 y.o. male history of peripheral neuropathy presents to the ER for evaluation of infection of the right great toe.  Has been having wound discomfort swelling redness there for several weeks.  No follow-up with podiatry clinic today with findings concerning for osteomyelitis and felt to require admission to the hospital for IV antibiotics MRI and probable surgical debridement possible amputation.  Patient's pain is mild.  No fevers.  No allergies to any medications.  Took 1 dose of doxycycline this morning. ?  ? ? ?Physical Exam  ? ?Triage Vital Signs: ?ED Triage Vitals  ?Enc Vitals Group  ?   BP 03/31/22 1501 (!) 153/80  ?   Pulse Rate 03/31/22 1501 78  ?   Resp 03/31/22 1501 18  ?   Temp 03/31/22 1501 98 ?F (36.7 ?C)  ?   Temp Source 03/31/22 1501 Oral  ?   SpO2 03/31/22 1501 97 %  ?   Weight 03/31/22 1502 185 lb (83.9 kg)  ?   Height 03/31/22 1502 '5\' 10"'$  (1.778 m)  ?   Head Circumference --   ?   Peak Flow --   ?   Pain Score 03/31/22 1501 6  ?   Pain Loc --   ?   Pain Edu? --   ?   Excl. in [place]? --   ? ? ?Most recent vital signs: ?Vitals:  ? 03/31/22 1501  ?BP: (!) 153/80  ?Pulse: 78  ?Resp: 18  ?Temp: 98 ?F (36.7 ?C)  ?SpO2: 97%  ? ? ? ?Constitutional: Alert  ?Eyes: Conjunctivae are normal.  ?Head: Atraumatic. ?Nose: No congestion/rhinnorhea. ?Mouth/Throat: Mucous membranes are moist.   ?Neck: Painless ROM.  ?Cardiovascular:   Good peripheral circulation. ?Respiratory: Normal respiratory effort.  No retractions.  ?Gastrointestinal: Soft and nontender.  ?Musculoskeletal:  no deformity, right le warm to touch, great toe dressed from podiatry clinic ?Neurologic:  MAE spontaneously. No gross focal neurologic deficits are appreciated.  ?Skin:  Skin is warm, dry and intact. No rash  noted. ?Psychiatric: Mood and affect are normal. Speech and behavior are normal. ? ? ? ?ED Results / Procedures / Treatments  ? ?Labs ?(all labs ordered are listed, but only abnormal results are displayed) ?Labs Reviewed  ?CBC WITH DIFFERENTIAL/PLATELET - Abnormal; Notable for the following components:  ?    Result Value  ? RBC 3.87 (*)   ? Hemoglobin 11.1 (*)   ? HCT 34.3 (*)   ? All other components within normal limits  ?LACTIC ACID, PLASMA  ?LACTIC ACID, PLASMA  ?COMPREHENSIVE METABOLIC PANEL  ?URINALYSIS, ROUTINE W REFLEX MICROSCOPIC  ? ? ? ?EKG ? ? ? ? ?RADIOLOGY ? ? ? ?PROCEDURES: ? ?Critical Care performed:  ? ?Procedures ? ? ?MEDICATIONS ORDERED IN ED: ?Medications  ?ceFEPIme (MAXIPIME) 1 g in sodium chloride 0.9 % 100 mL IVPB (has no administration in time range)  ? ? ? ?IMPRESSION / MDM / ASSESSMENT AND PLAN / ED COURSE  ?I reviewed the triage vital signs and the nursing notes. ?             ?               ? ?Differential diagnosis includes, but is not limited to, cellulitis, fracture,  osteomyelitis, gangrene ? ?Patient presenting to the ER for symptoms as described above.  Review of note from podiatry clinic today does appear to have large ulceration and his exam is consistent with cellulitic changes will order MRI per podiatry recommendation we will start on broad-spectrum antibiotics.  He is not septic no significant leukocytosis.  Hospitalist has been consulted for admission. ? ? ?  ? ? ?FINAL CLINICAL IMPRESSION(S) / ED DIAGNOSES  ? ?Final diagnoses:  ?Infection of great toe  ? ? ? ?Rx / DC Orders  ? ?ED Discharge Orders   ? ? None  ? ?  ? ? ? ?Note:  This document was prepared using Dragon voice recognition software and may include unintentional dictation errors. ? ?  ?Merlyn Lot, MD ?03/31/22 1531 ? ?

## 2022-03-31 NOTE — Assessment & Plan Note (Signed)
Stable.  Not on home oxygen.  Breathing comfortably ?

## 2022-03-31 NOTE — Assessment & Plan Note (Signed)
Looks to be euvolemic.  Breathing comfortably. ?

## 2022-03-31 NOTE — Assessment & Plan Note (Signed)
Stable continue meds

## 2022-03-31 NOTE — Assessment & Plan Note (Addendum)
We will continue patient's Percocet for his back upon discharge.  He received as needed IV morphine sparingly during hospitalization. ?

## 2022-03-31 NOTE — ED Notes (Signed)
Dr. Quentin Cornwall at bedside assessing pt at this time.  ?

## 2022-04-01 ENCOUNTER — Inpatient Hospital Stay: Payer: Medicare Other | Admitting: Anesthesiology

## 2022-04-01 ENCOUNTER — Encounter
Admission: EM | Disposition: A | Discharge status: Home / Self Care | Payer: Self-pay | Source: Home / Self Care | Attending: Internal Medicine

## 2022-04-01 DIAGNOSIS — L03115 Cellulitis of right lower limb: Secondary | ICD-10-CM

## 2022-04-01 DIAGNOSIS — M86171 Other acute osteomyelitis, right ankle and foot: Principal | ICD-10-CM

## 2022-04-01 HISTORY — PX: AMPUTATION TOE: SHX6595

## 2022-04-01 LAB — BASIC METABOLIC PANEL WITH GFR
Anion gap: 12 (ref 5–15)
BUN: 14 mg/dL (ref 8–23)
CO2: 30 mmol/L (ref 22–32)
Calcium: 8.4 mg/dL — ABNORMAL LOW (ref 8.9–10.3)
Chloride: 102 mmol/L (ref 98–111)
Creatinine, Ser: 0.87 mg/dL (ref 0.61–1.24)
GFR, Estimated: >60 mL/min
Glucose, Bld: 115 mg/dL — ABNORMAL HIGH (ref 70–99)
Potassium: 3.2 mmol/L — ABNORMAL LOW (ref 3.5–5.1)
Sodium: 144 mmol/L (ref 135–145)

## 2022-04-01 LAB — CBC
HCT: 34 % — ABNORMAL LOW (ref 39.0–52.0)
Hemoglobin: 11.1 g/dL — ABNORMAL LOW (ref 13.0–17.0)
MCH: 29 pg (ref 26.0–34.0)
MCHC: 32.6 g/dL (ref 30.0–36.0)
MCV: 88.8 fL (ref 80.0–100.0)
Platelets: 209 10*3/uL (ref 150–400)
RBC: 3.83 MIL/uL — ABNORMAL LOW (ref 4.22–5.81)
RDW: 12.2 % (ref 11.5–15.5)
WBC: 5.7 10*3/uL (ref 4.0–10.5)
nRBC: 0 % (ref 0.0–0.2)

## 2022-04-01 SURGERY — AMPUTATION TOE
Anesthesia: General | Site: Toe | Laterality: Right

## 2022-04-01 MED ORDER — 0.9 % SODIUM CHLORIDE (POUR BTL) OPTIME
TOPICAL | Status: DC | PRN
  Administered 2022-04-01: 1000 mL

## 2022-04-01 MED ORDER — OXYCODONE HCL 5 MG/5ML PO SOLN: 5.0000 mg | Freq: Once | ORAL | Status: DC | PRN

## 2022-04-01 MED ORDER — PROPOFOL 500 MG/50ML IV EMUL
INTRAVENOUS | Status: AC
  Filled 2022-04-01: qty 50

## 2022-04-01 MED ORDER — ONDANSETRON HCL 4 MG/2ML IJ SOLN: 4.0000 mg | Freq: Once | INTRAMUSCULAR | Status: DC | PRN

## 2022-04-01 MED ORDER — VANCOMYCIN HCL 1000 MG IV SOLR
INTRAVENOUS | Status: AC
  Filled 2022-04-01: qty 20

## 2022-04-01 MED ORDER — COVID-19MRNA BIVAL VACC PFIZER 30 MCG/0.3ML IM SUSP
0.3000 mL | Freq: Once | INTRAMUSCULAR | Status: DC
  Filled 2022-04-01: qty 0.3

## 2022-04-01 MED ORDER — EPHEDRINE SULFATE (PRESSORS) 50 MG/ML IJ SOLN
INTRAMUSCULAR | Status: DC | PRN
  Administered 2022-04-01: 5 mg via INTRAVENOUS
  Administered 2022-04-01 (×2): 10 mg via INTRAVENOUS

## 2022-04-01 MED ORDER — OXYCODONE HCL 5 MG PO TABS: 5.0000 mg | ORAL_TABLET | Freq: Once | ORAL | Status: DC | PRN

## 2022-04-01 MED ORDER — GLYCOPYRROLATE 0.2 MG/ML IJ SOLN
INTRAMUSCULAR | Status: DC | PRN
  Administered 2022-04-01: .2 mg via INTRAVENOUS

## 2022-04-01 MED ORDER — PROPOFOL 500 MG/50ML IV EMUL
INTRAVENOUS | Status: DC | PRN
  Administered 2022-04-01: 120 ug/kg/min via INTRAVENOUS

## 2022-04-01 MED ORDER — POTASSIUM CHLORIDE CRYS ER 20 MEQ PO TBCR
40.0000 meq | EXTENDED_RELEASE_TABLET | Freq: Once | ORAL | Status: AC
  Administered 2022-04-01: 40 meq via ORAL
  Filled 2022-04-01: qty 2

## 2022-04-01 MED ORDER — BUPIVACAINE HCL 0.5 % IJ SOLN
INTRAMUSCULAR | Status: DC | PRN
  Administered 2022-04-01: 10 mL

## 2022-04-01 MED ORDER — FENTANYL CITRATE (PF) 100 MCG/2ML IJ SOLN
INTRAMUSCULAR | Status: AC
  Filled 2022-04-01: qty 2

## 2022-04-01 MED ORDER — SODIUM CHLORIDE 0.9 % IV SOLN: INTRAVENOUS | Status: DC | PRN

## 2022-04-01 MED ORDER — SODIUM CHLORIDE 0.9 % IV SOLN
2.0000 g | Freq: Three times a day (TID) | INTRAVENOUS | Status: DC
  Administered 2022-04-01 – 2022-04-02 (×4): 2 g via INTRAVENOUS
  Filled 2022-04-01 (×6): qty 12.5

## 2022-04-01 MED ORDER — FENTANYL CITRATE (PF) 100 MCG/2ML IJ SOLN: 25.0000 ug | INTRAMUSCULAR | Status: DC | PRN

## 2022-04-01 MED ORDER — VANCOMYCIN HCL 1750 MG/350ML IV SOLN
1750.0000 mg | INTRAVENOUS | Status: DC
  Administered 2022-04-01 – 2022-04-02 (×2): 1750 mg via INTRAVENOUS
  Filled 2022-04-01 (×2): qty 350

## 2022-04-01 MED ORDER — NEOMYCIN-POLYMYXIN B GU 40-200000 IR SOLN
Status: AC
  Filled 2022-04-01: qty 2

## 2022-04-01 MED ORDER — DEXMEDETOMIDINE (PRECEDEX) IN NS 20 MCG/5ML (4 MCG/ML) IV SYRINGE
PREFILLED_SYRINGE | INTRAVENOUS | Status: DC | PRN
  Administered 2022-04-01: 12 ug via INTRAVENOUS

## 2022-04-01 MED ORDER — FENTANYL CITRATE (PF) 100 MCG/2ML IJ SOLN
INTRAMUSCULAR | Status: DC | PRN
  Administered 2022-04-01: 50 ug via INTRAVENOUS
  Administered 2022-04-01 (×2): 25 ug via INTRAVENOUS

## 2022-04-01 SURGICAL SUPPLY — 44 items
BLADE MED AGGRESSIVE (BLADE)
BLADE OSC/SAGITTAL MD 5.5X18 (BLADE)
BLADE SURG 15 STRL LF DISP TIS (BLADE)
BLADE SURG 15 STRL SS (BLADE)
BLADE SURG MINI STRL (BLADE)
BNDG CONFORM 2 STRL LF (GAUZE/BANDAGES/DRESSINGS)
BNDG ELASTIC 4X5.8 VLCR STR LF (GAUZE/BANDAGES/DRESSINGS) ×2
BNDG ESMARK 4X12 TAN STRL LF (GAUZE/BANDAGES/DRESSINGS) ×2
BNDG GAUZE ELAST 4 BULKY (GAUZE/BANDAGES/DRESSINGS) ×2
CNTNR SPEC 2.5X3XGRAD LEK (MISCELLANEOUS) ×1
CONT SPEC 4OZ STER OR WHT (MISCELLANEOUS) ×1
CONT SPEC 4OZ STRL OR WHT (MISCELLANEOUS) ×1
CUFF TOURN SGL QUICK 12 (TOURNIQUET CUFF)
CUFF TOURN SGL QUICK 18X4 (TOURNIQUET CUFF)
DRAPE FLUOR MINI C-ARM 54X84 (DRAPES)
DURAPREP 26ML APPLICATOR (WOUND CARE) ×2
ELECT REM PT RETURN 9FT ADLT (ELECTROSURGICAL) ×2
GAUZE SPONGE 4X4 12PLY STRL (GAUZE/BANDAGES/DRESSINGS) ×4
GAUZE XEROFORM 1X8 LF (GAUZE/BANDAGES/DRESSINGS) ×2
GLOVE SRG 8 PF TXTR STRL LF DI (GLOVE) ×1
GLOVE SURG ENC MOIS LTX SZ8 (GLOVE) ×2
GLOVE SURG UNDER POLY LF SZ8 (GLOVE) ×2
GOWN STRL REUS W/ TWL LRG LVL3 (GOWN DISPOSABLE) ×2
GOWN STRL REUS W/TWL LRG LVL3 (GOWN DISPOSABLE) ×4
HANDPIECE VERSAJET DEBRIDEMENT (MISCELLANEOUS)
IV NS IRRIG 3000ML ARTHROMATIC (IV SOLUTION)
KIT TURNOVER KIT A (KITS) ×2
LABEL OR SOLS (LABEL) ×2
MANIFOLD NEPTUNE II (INSTRUMENTS) ×2
NDL FILTER BLUNT 18X1 1/2 (NEEDLE) ×1 IMPLANT
NDL HYPO 25X1 1.5 SAFETY (NEEDLE) ×2 IMPLANT
NEEDLE FILTER BLUNT 18X 1/2SAF (NEEDLE) ×1
NEEDLE FILTER BLUNT 18X1 1/2 (NEEDLE) ×1
NEEDLE HYPO 25X1 1.5 SAFETY (NEEDLE) ×4
NS IRRIG 500ML POUR BTL (IV SOLUTION) ×2
PACK EXTREMITY ARMC (MISCELLANEOUS) ×2
SOL PREP PVP 2OZ (MISCELLANEOUS) ×2
STOCKINETTE STRL 6IN 960660 (GAUZE/BANDAGES/DRESSINGS) ×2
SUT ETHILON 3-0 FS-10 30 BLK (SUTURE) ×4
SUT VIC AB 3-0 SH 27 (SUTURE) ×2
SUT VIC AB 3-0 SH 27X BRD (SUTURE) ×1
SWAB CULTURE AMIES ANAERIB BLU (MISCELLANEOUS)
SYR 10ML LL (SYRINGE) ×2
WATER STERILE IRR 500ML POUR (IV SOLUTION) ×2

## 2022-04-01 NOTE — Assessment & Plan Note (Addendum)
Secondary to chronic ulcer and osteomyelitis.  Patient on vancomycin and cefepime.  For discharge, patient will discharge on to doxycycline 100 mg p.o. twice daily ?

## 2022-04-01 NOTE — Op Note (Signed)
Patient Name: Albert Wade ?DOB: 07-27-1946  ?[name]: [id number] ?  ?Date of Service: 03/31/2022 - 04/01/2022 ? ?Surgeon: Dr. Lanae Crumbly, DPM ?Assistants: None ?Pre-operative Diagnosis:  ?Great toe osteomyelitis right ?Post-operative Diagnosis:  ?Great toe osteomyelitis right ?Procedures: ? 1) amputation MTPJ great toe right ?Pathology/Specimens: ?ID Type Source Tests Collected by Time Destination  ?1 : right great toe (section at ulcer site and proximal margins) Amputation Toe, Right SURGICAL PATHOLOGY Criselda Peaches, DPM 04/01/2022 1118   ?A : right great toe (bone for culture) Tissue Bone AEROBIC/ANAEROBIC CULTURE W GRAM STAIN (SURGICAL/DEEP WOUND) Criselda Peaches, DPM 04/01/2022 1117   ? ?Anesthesia: IV sedation with local anesthesia ?Hemostasis: * Missing tourniquet times found for documented tourniquets in log: [id number] * ?Estimated Blood Loss: 2 mL ?Materials: * No implants in log * ?Medications: 10 cc of 0.5% Marcaine plain ?Complications: No complications noted ? ?Indications for Procedure:  ?This is a 76 y.o. male with a history of severe hallux deformity of the right great toe and overlying ulceration which has worsened acutely to the level of the bone.  Preoperative MRI revealed osteomyelitis.  Amputation was recommended and he consented to proceed.  All questions were addressed prior to surgery.  No guarantees as to the outcome of surgery were made.   ?  ?Procedure in Detail: ?Patient was identified in pre-operative holding area. Formal consent was signed and the right lower extremity was marked. Patient was brought back to the operating room. Anesthesia was induced. The extremity was prepped and draped in the usual sterile fashion. Timeout was taken to confirm patient name, laterality, and procedure prior to incision.  ? ?Attention was then directed to the right first toe where an incision was made in a fishmouth style around the first MTPJ. Dissection was carried down to level of bone.  Dissection was  continued to the MTP joint and all collateral ligaments were freed at the joint.  The bone soft tissue attachments of the proximal phalanx were removed and passed for pathology.  A bone culture was taken of the head of the proximal phalanx of the ulceration site.  The remaining metatarsal head appeared healthy and viable.  The area was copiously irrigated.  The skin was reapproximated with Monocryl nylon and skin staples. ? ? ?The foot was then dressed with Xeroform and dry sterile dressings. Patient tolerated the procedure well. ?  ?Disposition: ?Following a period of post-operative monitoring, patient will be transferred to the floor.  I expect he will have surgical cure of his osteomyelitis and may proceed to home tomorrow in a weightbearing postoperative shoe on oral antibiotics.Marland Kitchen ? ?

## 2022-04-01 NOTE — Final Consult Note (Signed)
? ?Reason for Consult: Right great toe infection, osteomyelitis ?Referring Physician: Dr. Maryland Pink ? ?Albert Wade is an 76 y.o. male.  ?HPI: Patient is a 76 year old male with a history of significant hallux deformity who developed a partial thickness ulceration that developed into a full-thickness ulceration.  He was under the care of my partner Dr. Posey Pronto in the outpatient setting for ongoing wound care.  They had planned for eventual reduction of the deformity later this month with IPJ fusion and excision of the ulcer.  Unfortunately it acutely worsened over the last weekend.  A prescription for doxycycline was sent to the pharmacy, he was only able to take 1 dose of it prior to seeing Dr. Posey Pronto yesterday in the office when it had acutely worsened.  Dr. Posey Pronto recommended admission for IV antibiotics and surgical intervention. ? ?Past Medical History:  ?Diagnosis Date  ? Acute bronchitis 01/01/2015  ? Acute upper respiratory infection 05/19/2016  ? Anginal pain ([place])   ? Anxiety   ? Arthritis   ? back,wrists,hx. previous fractures  ? BPH (benign prostatic hypertrophy)   ? BRONCHITIS, CHRONIC 04/29/2008  ? Qualifier: Diagnosis of  By: Halford Chessman MD, [place]    ? CAD 10/16/2009  ? Qualifier: Diagnosis of  By: Lynden Ang    ? CAD (coronary artery disease)   ? angioplasty   ? Chronic back pain   ? Chronic bronchitis   ? 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 Confusion 04/27/2017  ? COPD (chronic obstructive pulmonary disease) ([place])   ? Depression   ? Encounter for preventative adult health care exam with abnormal findings 12/20/2013  ? Fever 04/27/2017  ? GERD (gastroesophageal reflux disease)   ? Gynecomastia 05/12/2015  ? H/O hiatal hernia   ? pts wife states pt does not have   ? Heart murmur   ? History of kidney stones   ? HTN (hypertension)   ? Hypercholesteremia   ? Hyperglycemia 05/19/2016  ? HYPERLIPIDEMIA 04/29/2008  ? Qualifier: Diagnosis of  By: Jim Like    ? Impaired glucose tolerance  12/20/2013  ? Insomnia 04/27/2017  ? Lumbosacral radiculopathy 10/14/2016  ? MI (myocardial infarction) ([place])   ? mid-'90's  ? MYOCARDIAL INFARCTION 04/29/2008  ? Qualifier: History of  By: Jim Like    ? OSA (obstructive sleep apnea) 05/13/2011  ? Split night study 2011:  AHI 21/hr with obstructive and central events.  Placed on cpap and titrated to 13cm with reasonable control, but attempts to increase pressure increased the number of central events.  Medium resmed quattro full face mask used.    ? Peripheral neuropathy   ? peripheral neuropathy  ? Phimosis/adherent prepuce 10/21/2013  ? Pneumonia   ? Prostate cancer ([place])   ? Shortness of breath 11/05/2010  ? Qualifier: Diagnosis of  By: Marilynne Halsted RN, BSN, [place]    ? Sleep apnea   ? mild-no cpap, unable to tolerate  ? Squamous cell carcinoma of skin 10/29/2020  ? right mid volar forearm  ? Umbilical hernia   ? Umbilical hernia without obstruction and without gangrene 05/19/2016  ? URI (upper respiratory infection) 04/27/2017  ? UTI (urinary tract infection) 12/01/2016  ? Varicose veins   ? Varicose veins of bilateral lower extremities with other complications 44/96/7591  ? Wheezing 01/01/2015  ? ? ?Past Surgical History:  ?Procedure Laterality Date  ? ANGIOPLASTY    ? APPENDECTOMY    ? ARTERY BIOPSY Right 10/15/2021  ? Procedure: BIOPSY TEMPORAL ARTERY;  Surgeon: Lysle Pearl,  Isami, DO;  Location: [facility] ORS;  Service: General;  Laterality: Right;  ? BACK SURGERY    ? x3  ? CARDIAC CATHETERIZATION    ? 11'11  ? CIRCUMCISION N/A 10/21/2013  ? Procedure: CIRCUMCISION ADULT;  Surgeon: Bernestine Amass, MD;  Location: [facility];  Service: Urology;  Laterality: N/A;  ? COLONOSCOPY WITH PROPOFOL N/A 06/09/2021  ? Procedure: COLONOSCOPY WITH PROPOFOL;  Surgeon: Jonathon Bellows, MD;  Location: [facility] ENDOSCOPY;  Service: Gastroenterology;  Laterality: N/A;  ? CYSTOSCOPY N/A 10/21/2013  ? Procedure: CYSTOSCOPY FLEXIBLE;  Surgeon: Bernestine Amass, MD;  Location: [facility] ORS;  Service: Urology;   Laterality: N/A;  ? CYSTOSCOPY W/ URETERAL STENT PLACEMENT Right 06/01/2020  ? Procedure: CYSTOSCOPY WITH RETROGRADE PYELOGRAM/URETERAL STENT Exchange;  Surgeon: Hollice Espy, MD;  Location: [facility] ORS;  Service: Urology;  Laterality: Right;  ? CYSTOSCOPY WITH BIOPSY Right 01/06/2020  ? Procedure: CYSTOSCOPY WITH BIOPSY;  Surgeon: Hollice Espy, MD;  Location: [facility] ORS;  Service: Urology;  Laterality: Right;  ? CYSTOSCOPY/URETEROSCOPY/HOLMIUM LASER/STENT PLACEMENT Right 01/06/2020  ? Procedure: CYSTOSCOPY/URETEROSCOPY/HOLMIUM LASER/STENT PLACEMENT;  Surgeon: Hollice Espy, MD;  Location: [facility] ORS;  Service: Urology;  Laterality: Right;  ? ESOPHAGOGASTRODUODENOSCOPY N/A 02/03/2021  ? Procedure: ESOPHAGOGASTRODUODENOSCOPY (EGD);  Surgeon: Jonathon Bellows, MD;  Location[facility] ENDOSCOPY;  Service: Gastroenterology;  Laterality: N/A;  ? ESOPHAGOGASTRODUODENOSCOPY (EGD) WITH PROPOFOL N/A 03/09/2021  ? Procedure: ESOPHAGOGASTRODUODENOSCOPY (EGD) WITH PROPOFOL;  Surgeon: Jonathon Bellows, MD;  Location: [facility] ENDOSCOPY;  Service: Gastroenterology;  Laterality: N/A;  ? HERNIA REPAIR    ? INSERTION OF MESH N/A 06/17/2016  ? Procedure: INSERTION OF MESH;  Surgeon: Excell Seltzer, MD;  Location: [facility] ORS;  Service: General;  Laterality: N/A;  ? UMBILICAL HERNIA REPAIR N/A 06/17/2016  ? Procedure: REPAIR UMBILICAL HERNIA WITH MESH;  Surgeon: Excell Seltzer, MD;  Location: [facility] ORS;  Service: General;  Laterality: N/A;  ? WRIST SURGERY Right   ? ? ?Family History  ?Problem Relation Age of Onset  ? Prostate cancer Father   ? Emphysema Father   ? Hyperlipidemia Father   ? Hyperlipidemia Mother   ? ? ?Social History:  reports that he quit smoking about 26 years ago. His smoking use included cigarettes. He has a 20.00 pack-year smoking history. He has never used smokeless tobacco. He reports that he does not drink alcohol and does not use drugs. ? ?Allergies:  ?Allergies  ?Allergen Reactions  ? Cyclobenzaprine   ?  agitation   ? Seroquel  [Quetiapine] Hives  ? ? ?Medications: I have reviewed the patient's current medications. ? ?Results for orders placed or performed during the hospital encounter of 03/31/22 (from the past 48 hour(s))  ?Lactic acid, plasma     Status: None  ? Collection Time: 03/31/22  3:05 PM  ?Result Value Ref Range  ? Lactic Acid, Venous 1.8 0.5 - 1.9 mmol/L  ?  Comment: Performed at [facility], [address], [place], [place] [address]  ?Comprehensive metabolic panel     Status: Abnormal  ? Collection Time: 03/31/22  3:05 PM  ?Result Value Ref Range  ? Sodium 139 135 - 145 mmol/L  ? Potassium 3.3 (L) 3.5 - 5.1 mmol/L  ? Chloride 94 (L) 98 - 111 mmol/L  ? CO2 32 22 - 32 mmol/L  ? Glucose, Bld 152 (H) 70 - 99 mg/dL  ?  Comment: Glucose reference range applies only to samples taken after fasting for at least 8 hours.  ? BUN 14 8 - 23 mg/dL  ? Creatinine,  Ser 0.96 0.61 - 1.24 mg/dL  ? Calcium 8.9 8.9 - 10.3 mg/dL  ? Total Protein 7.3 6.5 - 8.1 g/dL  ? Albumin 3.4 (L) 3.5 - 5.0 g/dL  ? AST 19 15 - 41 U/L  ? ALT 20 0 - 44 U/L  ? Alkaline Phosphatase 111 38 - 126 U/L  ? Total Bilirubin 0.6 0.3 - 1.2 mg/dL  ? GFR, Estimated >60 >60 mL/min  ?  Comment: (NOTE) ?Calculated using the CKD-EPI Creatinine Equation (2021) ?  ? Anion gap 13 5 - 15  ?  Comment: Performed at [facility], [address] [place], [place] [address]  ?CBC with Differential     Status: Abnormal  ? Collection Time: 03/31/22  3:05 PM  ?Result Value Ref Range  ? WBC 7.1 4.0 - 10.5 K/uL  ? RBC 3.87 (L) 4.22 - 5.81 MIL/uL  ? Hemoglobin 11.1 (L) 13.0 - 17.0 g/dL  ? HCT 34.3 (L) 39.0 - 52.0 %  ? MCV 88.6 80.0 - 100.0 fL  ? MCH 28.7 26.0 - 34.0 pg  ? MCHC 32.4 30.0 - 36.0 g/dL  ? RDW 12.2 11.5 - 15.5 %  ? Platelets 221 150 - 400 K/uL  ? nRBC 0.0 0.0 - 0.2 %  ? Neutrophils Relative % 75 %  ? Neutro Abs 5.3 1.7 - 7.7 K/uL  ? Lymphocytes Relative 18 %  ? Lymphs Abs 1.3 0.7 - 4.0 K/uL  ? Monocytes Relative 6 %  ? Monocytes Absolute 0.4 0.1 - 1.0 K/uL  ?  Eosinophils Relative 1 %  ? Eosinophils Absolute 0.1 0.0 - 0.5 K/uL  ? Basophils Relative 0 %  ? Basophils Absolute 0.0 0.0 - 0.1 K/uL  ? Immature Granulocytes 0 %  ? Abs Immature Granulocytes 0.02 0.00 - 0.07 K/uL

## 2022-04-01 NOTE — Anesthesia Postprocedure Evaluation (Signed)
Anesthesia Post Note ? ?Patient: Albert Wade ? ?Procedure(s) Performed: AMPUTATION TOE-Right Great Toe Amputation (Right: Toe) ? ?Patient location during evaluation: PACU ?Anesthesia Type: General ?Level of consciousness: awake and alert ?Pain management: pain level controlled ?Vital Signs Assessment: post-procedure vital signs reviewed and stable ?Respiratory status: spontaneous breathing, nonlabored ventilation, respiratory function stable and patient connected to nasal cannula oxygen ?Cardiovascular status: blood pressure returned to baseline and stable ?Postop Assessment: no apparent nausea or vomiting ?Anesthetic complications: no ? ? ?No notable events documented. ? ? ?Last Vitals:  ?Vitals:  ? 04/01/22 1200 04/01/22 1226  ?BP: 133/65 (!) 150/72  ?Pulse: 70 68  ?Resp: 10 16  ?Temp:  (!) 36.4 ?C  ?SpO2: 97% 98%  ?  ?Last Pain:  ?Vitals:  ? 04/01/22 1145  ?TempSrc:   ?PainSc: 0-No pain  ? ? ?  ?  ?  ?  ?  ?  ? ?[name] ? ? ? ? ?

## 2022-04-01 NOTE — Transfer of Care (Signed)
Immediate Anesthesia Transfer of Care Note ? ?Patient: Albert Wade ? ?Procedure(s) Performed: AMPUTATION TOE-Right Great Toe Amputation (Right: Toe) ? ?Patient Location: PACU ? ?Anesthesia Type:General ? ?Level of Consciousness: drowsy ? ?Airway & Oxygen Therapy: Patient Spontanous Breathing and Patient connected to face mask oxygen ? ?Post-op Assessment: Report given to RN ? ?Post vital signs: stable ? ?Last Vitals:  ?Vitals Value Taken Time  ?BP 102/61 04/01/22 1139  ?Temp    ?Pulse 41 04/01/22 1142  ?Resp 23 04/01/22 1142  ?SpO2 92 % 04/01/22 1142  ?Vitals shown include unvalidated device data. ? ?Last Pain:  ?Vitals:  ? 04/01/22 0957  ?TempSrc: Tympanic  ?PainSc: 8   ?   ? ?  ? ?Complications: No notable events documented. ?

## 2022-04-01 NOTE — Brief Op Note (Signed)
04/01/2022 ? ?11:42 AM ? ?PATIENT:  Albert Wade  76 y.o. male ? ?PRE-OPERATIVE DIAGNOSIS:  Right Great Toe Amp ? ?POST-OPERATIVE DIAGNOSIS:  Right Great Toe Amp ? ?PROCEDURE:  Procedure(s): ?AMPUTATION TOE-Right Great Toe Amputation (Right) ? ?SURGEON:  Surgeon(s) and Role: ?   * Juanluis Guastella, Stephan Minister, DPM - Primary ? ? ?ASSISTANTS: none  ? ?ANESTHESIA:   MAC ? ?EBL:  2 mL  ? ?BLOOD ADMINISTERED:none ? ?DRAINS: none  ? ?LOCAL MEDICATIONS USED:  BUPIVICAINE  and Amount: 10 ml ? ?SPECIMEN: Right great toe ? ?DISPOSITION OF SPECIMEN: Bone culture for microbiology and bone pathology ? ?COUNTS:  YES ? ?TOURNIQUET:  * Missing tourniquet times found for documented tourniquets in log: [id number] * ? ?DICTATION: .Note written in EPIC ? ?PLAN OF CARE: Admit to inpatient  ? ?PATIENT DISPOSITION:  PACU - hemodynamically stable. ?  ?Delay start of Pharmacological VTE agent (>24hrs) due to surgical blood loss or risk of bleeding: no ? ?

## 2022-04-01 NOTE — Plan of Care (Signed)
?  Problem: Education: ?Goal: Knowledge of General Education information will improve ?Description: Including pain rating scale, medication(s)/side effects and non-pharmacologic comfort measures ?Outcome: Progressing ?  ?Problem: Health Behavior/Discharge Planning: ?Goal: Ability to manage health-related needs will improve ?Outcome: Progressing ?  ?Problem: Clinical Measurements: ?Goal: Ability to maintain clinical measurements within normal limits will improve ?Outcome: Progressing ?Goal: Will remain free from infection ?Outcome: Progressing ?Goal: Diagnostic test results will improve ?Outcome: Progressing ?Goal: Respiratory complications will improve ?Outcome: Progressing ?Goal: Cardiovascular complication will be avoided ?Outcome: Progressing ?  ?Problem: Coping: ?Goal: Level of anxiety will decrease ?Outcome: Progressing ?  ?Problem: Elimination: ?Goal: Will not experience complications related to bowel motility ?Outcome: Progressing ?Goal: Will not experience complications related to urinary retention ?Outcome: Progressing ?  ?Problem: Pain Managment: ?Goal: General experience of comfort will improve ?Outcome: Progressing ?  ?

## 2022-04-01 NOTE — Anesthesia Preprocedure Evaluation (Signed)
Anesthesia Evaluation  ?Patient identified by [name], date of birth, ID band ?Patient awake ? ? ? ?Reviewed: ?Allergy & Precautions, H&P , NPO status , Patient's Chart, lab work & pertinent test results, reviewed documented beta blocker date and time  ? ?Airway ?Mallampati: III ? ?TM Distance: >3 FB ?Neck ROM: full ? ? ? Dental ? ?(+) Edentulous Upper, Edentulous Lower ?  ?Pulmonary ?shortness of breath and with exertion, sleep apnea , pneumonia, resolved, COPD,  COPD inhaler, former smoker,  ?  ?Pulmonary exam normal ?breath sounds clear to auscultation ? ? ? ? ? ? Cardiovascular ?Exercise Tolerance: Poor ?hypertension, + angina with exertion + CAD, + Past MI and +CHF  ?Normal cardiovascular exam+ Valvular Problems/Murmurs  ?Rhythm:Regular Rate:Normal ? ?1. Left ventricular ejection fraction, by estimation, is 55 to 60%. The  ?left ventricle has normal function. The left ventricle has no regional  ?wall motion abnormalities. Left ventricular diastolic parameters are  ?consistent with Grade I diastolic  ?dysfunction (impaired relaxation).  ??2. Right ventricular systolic function is normal. The right ventricular  ?size is normal. There is normal pulmonary artery systolic pressure.  ??3. Left atrial size was severely dilated.  ??4. The mitral valve is normal in structure. No evidence of mitral valve  ?regurgitation. No evidence of mitral stenosis.  ??5. The aortic valve is tricuspid. Aortic valve regurgitation is not  ?visualized. No aortic stenosis is present.  ??6. The inferior vena cava is normal in size with greater than 50%  ?respiratory variability, suggesting right atrial pressure of 3 mmHg ?  ?Neuro/Psych ?PSYCHIATRIC DISORDERS Depression  Neuromuscular disease   ? GI/Hepatic ?Neg liver ROS, hiatal hernia, GERD  ,  ?Endo/Other  ?negative endocrine ROS ? Renal/GU ?negative Renal ROS  ?negative genitourinary ?  ?Musculoskeletal ? ? Abdominal ?  ?Peds ? Hematology ?negative  hematology ROS ?(+)   ?Anesthesia Other Findings ?Past Medical History: ?01/01/2015: Acute bronchitis ?05/19/2016: Acute upper respiratory infection ?No date: Anginal pain ([place]) ?No date: Anxiety ?No date: Arthritis ?    Comment:  back,wrists,hx. previous fractures ?No date: BPH (benign prostatic hypertrophy) ?04/29/2008: BRONCHITIS, CHRONIC ?    Comment:  Qualifier: Diagnosis of  By: Halford Chessman MD, [place]   ?10/16/2009: CAD ?    Comment:  Qualifier: Diagnosis of  By: Lynden Ang   ?No date: CAD (coronary artery disease) ?    Comment:  angioplasty  ?No date: Chronic back pain ?No date: Chronic bronchitis ?10/14/2016: Chronic low back pain with sciatica ?04/27/2017: Confusion ?No date: COPD (chronic obstructive pulmonary disease) ([place]) ?No date: Depression ?12/20/2013: Encounter for preventative adult health care exam with  ?abnormal findings ?04/27/2017: Fever ?No date: GERD (gastroesophageal reflux disease) ?05/12/2015: Gynecomastia ?No date: H/O hiatal hernia ?    Comment:  pts wife states pt does not have  ?No date: Heart murmur ?No date: History of kidney stones ?No date: HTN (hypertension) ?No date: Hypercholesteremia ?05/19/2016: Hyperglycemia ?04/29/2008: HYPERLIPIDEMIA ?    Comment:  Qualifier: Diagnosis of  By: Jim Like   ?12/20/2013: Impaired glucose tolerance ?04/27/2017: Insomnia ?10/14/2016: Lumbosacral radiculopathy ?No date: MI (myocardial infarction) ([place]) ?    Comment:  mid-'90's ?04/29/2008: MYOCARDIAL INFARCTION ?    Comment:  Qualifier: History of  By: Jim Like   ?05/13/2011: OSA (obstructive sleep apnea) ?    Comment:  Split night study 2011:  AHI 21/hr with obstructive and  ?             central events.  Placed on cpap and titrated to 13cm with ?  reasonable control, but attempts to increase pressure  ?             increased the number of central events.  Medium resmed  ?             quattro full face mask used.   ?No date: Peripheral neuropathy ?    Comment:  peripheral  neuropathy ?10/21/2013: Phimosis/adherent prepuce ?No date: Pneumonia ?No date: Prostate cancer [facility]) ?11/05/2010: Shortness of breath ?    Comment:  Qualifier: Diagnosis of  By: Marilynne Halsted RN, BSN, [place]   ?No date: Sleep apnea ?    Comment:  mild-no cpap, unable to tolerate ?10/29/2020: Squamous cell carcinoma of skin ?    Comment:  right mid volar forearm ?No date: Umbilical hernia ?6/96/2952: Umbilical hernia without obstruction and without gangrene ?04/27/2017: URI (upper respiratory infection) ?12/01/2016: UTI (urinary tract infection) ?No date: Varicose veins ?10/14/2016: Varicose veins of bilateral lower extremities with other  ?complications ?01/01/2015: Wheezing ? ? Reproductive/Obstetrics ?negative OB ROS ? ?  ? ? ? ? ? ? ? ? ? ? ? ? ? ?  ?  ? ? ? ? ? ? ? ? ?Anesthesia Physical ? ?Anesthesia Plan ? ?ASA: 3 ? ?Anesthesia Plan: General  ? ?Post-op Pain Management: Minimal or no pain anticipated  ? ?Induction: Intravenous ? ?PONV Risk Score and Plan: 2 and Propofol infusion, TIVA, Ondansetron and Midazolam ? ?Airway Management Planned: Nasal Cannula and Natural Airway ? ?Additional Equipment: None ? ?Intra-op Plan:  ? ?Post-operative Plan:  ? ?Informed Consent: I have reviewed the patients History and Physical, chart, labs and discussed the procedure including the risks, benefits and alternatives for the proposed anesthesia with the patient or authorized representative who has indicated his/her understanding and acceptance.  ? ? ? ?Dental advisory given ? ?Plan Discussed with: CRNA ? ?Anesthesia Plan Comments: (Discussed risks of anesthesia with patient, including possibility of difficulty with spontaneous ventilation under anesthesia necessitating airway intervention, PONV, and rare risks such as cardiac or respiratory or neurological events, and allergic reactions. Discussed the role of CRNA in patient's perioperative care. Patient understands.)  ? ? ? ? ? ? ?Anesthesia Quick Evaluation ? ?

## 2022-04-01 NOTE — Progress Notes (Signed)
[facility] Progress Note ? ?Patient: Albert Wade    JGG:[id number]  DOA: 03/31/2022    ?Date of Service: the patient was seen and examined on 04/01/2022 ? ?Brief hospital course: ?Patient is a 76 year old male with past medical history of CAD, prostate cancer and diastolic heart failure who has had issues with a chronic wound on his right great toe for some time.  In the last few days, patient started developing some drainage from the toe wound as well as erythema going up his foot.  Patient started on antibiotic as outpatient.  After being seen by his podiatrist, patient was sent over to the emergency room on 4/6 for concerns of osteomyelitis.  Patient admitted to the hospitalist service and MRI notes osteomyelitis of the proximal and distal phalanges of the first digit with an oblique fracture through the distal aspect of the proximal phalanx, suspected pathologic fracture.  Soft tissue edema and skin irregularity about the first digit consistent with cellulitis.  Podiatry able to take patient to the OR today, 4/7. ? ?Assessment and Plan: ?Assessment and Plan: ?* Acute osteomyelitis of toe of right foot ([place]) ?MRI confirms osteomyelitis.  For OR for toe amputation today. ? ?Cellulitis of right foot ?Secondary to chronic ulcer and osteomyelitis.  Patient on vancomycin and cefepime. ? ?Chronic diastolic CHF (congestive heart failure) ([place]) ?Looks to be euvolemic.  Breathing comfortably. ? ?COPD (chronic obstructive pulmonary disease) ([place]) ?Stable.  Not on home oxygen.  Breathing comfortably ? ?Depression with anxiety ?Continue Cymbalta at Xanax plus nightly trazodone ? ?Chronic back pain ?We will continue patient's Percocet for his back plus low-dose as needed IV morphine for his toe.  Pain controlled. ? ?HTN (hypertension) ?Continue home medications. ? ?HLD (hyperlipidemia) ?Stable, continue statin ? ?Benign prostatic hyperplasia ?Stable continue meds ? ?Overweight (BMI 25.0-29.9) ?Meets criteria BMI  greater than 25 ? ? ? ? ? ? ?Body mass index is 26.54 kg/m?.  ?  ?   ? ?Consultants: ?Podiatry ? ?Procedures: ?Right great toe amputation 4/7 ? ? ?Antimicrobials: ?IV cefepime and vancomycin 4/6-present ? ? ? ?Code Status: Full ? ? ?Subjective: Patient doing okay.  Pain tolerated.  Surgery ? ?Objective: ?Vital signs were reviewed and unremarkable. ?Vitals:  ? 04/01/22 0725 04/01/22 0957  ?BP: 127/71 (!) 142/62  ?Pulse: 63 65  ?Resp: 16 16  ?Temp: 98.2 ?F (36.8 ?C) 98 ?F (36.7 ?C)  ?SpO2: 95% 96%  ? ? ?Intake/Output Summary (Last 24 hours) at 04/01/2022 1133 ?Last data filed at 04/01/2022 1131 ?Gross per 24 hour  ?Intake 785.74 ml  ?Output 2 ml  ?Net 783.74 ml  ? ?Filed Weights  ? 03/31/22 1502 04/01/22 0957  ?Weight: 83.9 kg 83.9 kg  ? ?Body mass index is 26.54 kg/m?. ? ?Exam: ? ?General: Alert and oriented x3, no acute distress ?HEENT: Normocephalic, atraumatic, mucous membranes moist ?Cardiovascular: Regular rate and rhythm, S1-S2 ?Respiratory: Clear to auscultation bilaterally ?Abdomen: Soft, nontender, nondistended, positive bowel sounds ?Musculoskeletal: No clubbing or cyanosis or edema, right great toe wrapped ?Skin: Ascending cellulitis right foot ?Psychiatry: Appropriate, no evidence of psychoses ?Neurology: No focal deficits ? ?Data Reviewed: ?Potassium today of 3.2.  Hemoglobin stable.  Noted normal procalcitonin.  MRI reviewed ? ?Disposition:  ?Status is: Inpatient ?Remains inpatient appropriate because: Surgery ?  ? ?Anticipated discharge date: 4/8 ? ?Remaining issues to be resolved so that patient can be discharged: Surgery and clearance by podiatry ? ? ?Family Communication: Wife and son at the bedside ?DVT Prophylaxis: ?enoxaparin (LOVENOX) injection 40 mg  Start: 03/31/22 2000 ? ? ? ?Author: ?Annita Brod ,MD ?04/01/2022 11:33 AM ? ?To reach On-call, see care teams to locate the attending and reach out via [email or web address]. ?Between 7PM-7AM, please contact night-coverage ?If you still have difficulty  reaching the attending provider, please page the [facility] (Director on Call) for [facility] on amion for assistance. ? ?

## 2022-04-01 NOTE — Progress Notes (Signed)
Pharmacy Antibiotic Note ? ?Albert Wade is a 76 y.o. male admitted on 03/31/2022 with osteomyelitis.  Pharmacy has been consulted for Cefepime and Vancomycin dosing. ? ?Plan: ?Cefepime 2 gm q8h per indication and renal fxn. ? ?Pt given initial dose of Vancomycin 1500 mg x 1 ?Vancomycin 1750 mg IV Q 24 hrs.  ?Goal AUC 400-550. ?Expected AUC: 478.8 ?SCr used: 0.96 ? ?Pharmacy will continue to follow and will adjust abx dosing whenever warranted. ? ? ?Height: '5\' 10"'$  (177.8 cm) ?Weight: 83.9 kg (185 lb) ?IBW/kg (Calculated) : 73 ? ?Temp (24hrs), Avg:97.9 ?F (36.6 ?C), Min:97.6 ?F (36.4 ?C), Max:98 ?F (36.7 ?C) ? ?Recent Labs  ?Lab 03/31/22 ?1505 03/31/22 ?2154  ?WBC 7.1  --   ?CREATININE 0.96  --   ?LATICACIDVEN 1.8 1.5  ?  ?Estimated Creatinine Clearance: 67.6 mL/min (by C-G formula based on SCr of 0.96 mg/dL).   ? ?Allergies  ?Allergen Reactions  ? Cyclobenzaprine   ?  agitation   ? Seroquel [Quetiapine] Hives  ? ? ?Antimicrobials this admission: ?4/06 Cefepime >> ?4/06 Vancomycin >>  ? ?Microbiology results: ?No new lab cx ordered or pending at this time ? ?Thank you for allowing pharmacy to be a part of this patient?s care. ? ?Renda Rolls, PharmD, MBA ?04/01/2022 ?4:37 AM ? ? ?

## 2022-04-01 NOTE — H&P (Signed)
History and Physical Interval Note: ? ?04/01/2022 ?10:45 AM ? ?Albert Wade  has presented today for surgery, with the diagnosis of osteomyelitis.  The various methods of treatment have been discussed with the patient and family. After consideration of risks, benefits and other options for treatment, the patient has consented to   ?Procedure(s): ?AMPUTATION TOE-Right Great Toe Amputation (Right) as a surgical intervention.  The patient's history has been reviewed, patient examined, no change in status, stable for surgery.  I have reviewed the patient's chart and labs.  Questions were answered to the patient's satisfaction.   ? ? ?[name] ? ? ?

## 2022-04-02 ENCOUNTER — Encounter: Payer: Self-pay | Admitting: Podiatry

## 2022-04-02 LAB — CBC
HCT: 36 % — ABNORMAL LOW (ref 39.0–52.0)
Hemoglobin: 11.8 g/dL — ABNORMAL LOW (ref 13.0–17.0)
MCH: 29 pg (ref 26.0–34.0)
MCHC: 32.8 g/dL (ref 30.0–36.0)
MCV: 88.5 fL (ref 80.0–100.0)
Platelets: 247 10*3/uL (ref 150–400)
RBC: 4.07 MIL/uL — ABNORMAL LOW (ref 4.22–5.81)
RDW: 12.1 % (ref 11.5–15.5)
WBC: 8 10*3/uL (ref 4.0–10.5)
nRBC: 0 % (ref 0.0–0.2)

## 2022-04-02 LAB — CREATININE, SERUM
Creatinine, Ser: 0.86 mg/dL (ref 0.61–1.24)
GFR, Estimated: >60 mL/min

## 2022-04-02 NOTE — Discharge Summary (Signed)
 " Physician Discharge Summary   Patient: Albert Wade [name]: [id number] DOB: 1946/10/24  Admit date:     03/31/2022  Discharge date: 04/02/22  Discharge Physician: Jazen Spraggins K Janifer Gieselman   PCP: Norleen Lynwood LELON, MD   Recommendations at discharge:   Discharge medications: Doxycycline  100 mg p.o. twice daily.  Please note that the patient's pharmacy is closed, but he states that he already had some doxycycline  prior to hospitalization and will discontinue this.  He also declined any new prescription for pain medication and says that he will use his already existing Percocet as needed. Podiatry office will call patient on Monday 4/10 to set up follow-up appointment on Wednesday, 4/12  Discharge Diagnoses: Principal Problem:   Acute osteomyelitis of toe of right foot (HCC) Active Problems:   Cellulitis of right foot   Chronic diastolic CHF (congestive heart failure) (HCC)   COPD (chronic obstructive pulmonary disease) (HCC)   Depression with anxiety   Chronic back pain   HTN (hypertension)   OSA (obstructive sleep apnea)   CAD (coronary artery disease)   HLD (hyperlipidemia)   Benign prostatic hyperplasia   Overweight (BMI 25.0-29.9)  Resolved Problems:   * No resolved hospital problems. [facility] Course: Patient is a 76 year old male with past medical history of CAD, prostate cancer and diastolic heart failure who has had issues with a chronic wound on his right great toe for some time.  In the last few days, patient started developing some drainage from the toe wound as well as erythema going up his foot.  Patient started on antibiotic as outpatient.  After being seen by his podiatrist, patient was sent over to the emergency room on 4/6 for concerns of osteomyelitis.  Patient admitted to the hospitalist service and MRI notes osteomyelitis of the proximal and distal phalanges of the first digit with an oblique fracture through the distal aspect of the proximal phalanx, suspected pathologic  fracture.  Soft tissue edema and skin irregularity about the first digit consistent with cellulitis.  Podiatry took patient for right first toe amputation at the first [facility].  Surgery went well without complications.  Patient seen following day and cleared for discharge.  He will go home with postoperative shoe and on oral antibiotics.  Assessment and Plan: * Acute osteomyelitis of toe of right foot (HCC) MRI confirmed osteomyelitis.  Status post right first great toe amputation.  Postop, patient will keep wound wrapped.  Not allowed to get wet.  Podiatry will call patient on Monday 4/10 for follow-up appointment on Wednesday 4/12.  Weightbearing as tolerated while wearing boot.  Cellulitis of right foot Secondary to chronic ulcer and osteomyelitis.  Patient on vancomycin  and cefepime .  For discharge, patient will discharge on to doxycycline  100 mg p.o. twice daily  Chronic diastolic CHF (congestive heart failure) (HCC) Looks to be euvolemic.  Breathing comfortably.  COPD (chronic obstructive pulmonary disease) (HCC) Stable.  Not on home oxygen.  Breathing comfortably  Depression with anxiety Continue Cymbalta  at Xanax  plus nightly trazodone   Chronic back pain We will continue patient's Percocet for his back upon discharge.  He received as needed IV morphine  sparingly during hospitalization.  HTN (hypertension) Stable, continued on his home medications.  HLD (hyperlipidemia) Stable, continue statin  Benign prostatic hyperplasia Stable continue meds  Overweight (BMI 25.0-29.9) Meets criteria BMI greater than 25        Pain control - [place]  Controlled Substance Reporting System database was reviewed. and patient was instructed, not to drive, operate  heavy machinery, perform activities at heights, swimming or participation in water activities or provide baby-sitting services while on Pain, Sleep and Anxiety Medications; until their outpatient Physician has advised to do so  again. Also recommended to not to take more than prescribed Pain, Sleep and Anxiety Medications.  Consultants: Podiatry Procedures performed: Amputation of first toe on right Disposition: Home Diet recommendation:  Discharge Diet Orders (From admission, onward)     Start     Ordered   04/02/22 0000  Diet - low sodium heart healthy        04/02/22 1302           Cardiac diet DISCHARGE MEDICATION: Allergies as of 04/02/2022       Reactions   Cyclobenzaprine    agitation    Seroquel [quetiapine] Hives        Medication List     TAKE these medications    albuterol  108 (90 Base) MCG/ACT inhaler Commonly known as: VENTOLIN  HFA INHALE 2 PUFFS INTO THE LUNGS 4 TIMES DAILY AS NEEDED   ALPRAZolam  0.5 MG tablet Commonly known as: XANAX  Take 0.5 mg by mouth 2 (two) times daily as needed for anxiety.   amLODipine  2.5 MG tablet Commonly known as: NORVASC  Take 1 tablet (2.5 mg total) by mouth daily.   aspirin  81 MG tablet Take 81 mg by mouth daily.   blood glucose meter kit and supplies Kit Dispense based on patient and insurance preference. Use up to four times daily as directed.   doxycycline  100 MG tablet Commonly known as: VIBRA -TABS Take 100 mg by mouth 2 (two) times daily.   DULoxetine  60 MG capsule Commonly known as: CYMBALTA  Take 60 mg by mouth 2 (two) times daily.   Flovent  HFA 44 MCG/ACT inhaler Generic drug: fluticasone  Inhale 2 puffs into the lungs 2 (two) times daily. As needed   furosemide  40 MG tablet Commonly known as: LASIX  Take 1 tablet (40 mg total) by mouth 2 (two) times daily.   omeprazole  20 MG capsule Commonly known as: PriLOSEC Take 1 capsule (20 mg total) by mouth 2 (two) times daily.   oxyCODONE -acetaminophen  10-325 MG tablet Commonly known as: PERCOCET Take 1 tablet by mouth every 4 (four) hours as needed for pain. What changed:  how much to take when to take this additional instructions   simvastatin  40 MG tablet Commonly  known as: ZOCOR  Take 1 tablet (40 mg total) by mouth at bedtime.   traZODone  50 MG tablet Commonly known as: DESYREL  Take 1 tablet (50 mg total) by mouth at bedtime as needed for sleep. What changed:  how much to take additional instructions               Discharge Care Instructions  (From admission, onward)           Start     Ordered   04/02/22 0000  Discharge wound care:       Comments: Keep wrapped until you see podiatrist.  Do not get wet.   04/02/22 1302            Discharge Exam: Filed Weights   03/31/22 1502 04/01/22 0957  Weight: 83.9 kg 83.9 kg   General: Alert and oriented x3, no acute distress Cardiovascular: Regular rate and rhythm, S1-S2 Right foot wrapped  Condition at discharge: good  The results of significant diagnostics from this hospitalization (including imaging, microbiology, ancillary and laboratory) are listed below for reference.   Imaging Studies: MR TOES RIGHT WO CONTRAST  Result  Date: 03/31/2022 CLINICAL DATA:  Patient presents for follow-up for right great toe ulceration, patient stated has got heart red and swollen; and there is also more drainage from the wound. EXAM: MRI OF THE RIGHT TOES WITHOUT CONTRAST TECHNIQUE: Multiplanar, multisequence MR imaging of the right was performed. No intravenous contrast was administered. COMPARISON:  Radiographs dated January 27 2022 FINDINGS: Bones/Joint/Cartilage There is susceptibility artifact from orthopedic hardware in the second and third phalanges as well as in the second metatarsal head and first metatarsal shaft. There is bone marrow edema in the proximal and distal phalanges of the first digit, concerning for osteomyelitis with a oblique fracture through the distal aspect of the proximal phalanx. There are degenerative changes of the first metatarsophalangeal joint. Bone marrow signal within the remaining forefoot is within normal limits. Ligaments Collateral ligaments are intact. The  Lisfranc ligament is also maintained. Muscles and Tendons There is generalized muscle edema of the plantar muscles. The flexor and extensor tendons are grossly intact, evaluation is however somewhat limited due to motion. Soft tissues There is marked subcutaneous soft tissue edema about the dorsum of the foot concerning for cellulitis. Skin irregularity about the dorsal and medial aspect of the first digit, significant motion however limits evaluation. IMPRESSION: 1. Osteomyelitis of the proximal and distal phalanges of the first digit with a oblique fracture through the distal aspect of the proximal phalanx, which may represent a pathological fracture. 2. Marked soft tissue edema and skin irregularity about the first digit consistent with patient's known ulcer with cellulitis. 3.  Ligaments appears intact. Electronically Signed   By: Imran  Ahmed D.O.   On: 03/31/2022 21:44    Microbiology: Results for orders placed or performed during the hospital encounter of 03/31/22  Surgical PCR screen     Status: Abnormal   Collection Time: 03/31/22  8:35 PM   Specimen: Nasal Mucosa; Nasal Swab  Result Value Ref Range Status   MRSA, PCR NEGATIVE NEGATIVE Final   Staphylococcus aureus POSITIVE (A) NEGATIVE Final    Comment: (NOTE) The Xpert SA Assay (FDA approved for NASAL specimens in patients 38 years of age and older), is one component of a comprehensive surveillance program. It is not intended to diagnose infection nor to guide or monitor treatment. Performed at [facility], [address] [place], [place] [address]   Aerobic/Anaerobic Culture w Gram Stain (surgical/deep wound)     Status: None (Preliminary result)   Collection Time: 04/01/22 11:17 AM   Specimen: Bone; Tissue  Result Value Ref Range Status   Specimen Description   Final    BONE Performed at [facility], [address], [place], [place] [address]    Special Requests   Final    RT TOE Performed at [facility], [address] [place], [place] [address]    Gram Stain   Final    NO SQUAMOUS EPITHELIAL CELLS SEEN FEW WBC SEEN NO ORGANISMS SEEN    Culture   Final    RARE GRAM POSITIVE COCCI CULTURE REINCUBATED FOR BETTER GROWTH Performed at [facility], [address], [place], [place] [address]    Report Status PENDING  Incomplete    Labs: CBC: Recent Labs  Lab 03/31/22 1505 04/01/22 0314 04/02/22 0535  WBC 7.1 5.7 8.0  NEUTROABS 5.3  --   --   HGB 11.1* 11.1* 11.8*  HCT 34.3* 34.0* 36.0*  MCV 88.6 88.8 88.5  PLT 221 209 247   Basic Metabolic Panel: Recent  Labs  Lab 03/31/22 1505 04/01/22 0314 04/02/22 0535  NA 139 144  --   K 3.3* 3.2*  --   CL 94* 102  --   CO2 32 30  --   GLUCOSE 152* 115*  --   BUN 14 14  --   CREATININE 0.96 0.87 0.86  CALCIUM  8.9 8.4*  --    Liver Function Tests: Recent Labs  Lab 03/31/22 1505  AST 19  ALT 20  ALKPHOS 111  BILITOT 0.6  PROT 7.3  ALBUMIN 3.4*   CBG: No results for input(s): GLUCAP in the last 168 hours.  Discharge time spent: less than 30 minutes.  Signed: Yaniel Limbaugh K Ervin Hensley, MD [facility] 04/02/2022 "

## 2022-04-02 NOTE — TOC Transition Note (Signed)
Transition of Care (TOC) - CM/SW Discharge Note ? ? ?Patient Details  ?Name: Albert Wade ?[name]: [id number] ?Date of Birth: 1946-09-12 ? ?Transition of Care (TOC) CM/SW Contact:  ?Izola Price, RN ?Phone Number: ?04/02/2022, 1:21 PM ? ? ?Clinical Narrative:   04/02/22: Patient discharging today with surgical boot in place. No HH or DME orders, spouse will transport home on discharge from acute care. Patient to keep dressing in place till podiatrist follow up appointment per discharge instructions. Simmie Davies RN CM  ? ? ? ?Final next level of care: Home/Self Care ?Barriers to Discharge: Barriers Resolved ? ? ?Patient Goals and CMS Choice ?  ?  ?Choice offered to / list presented to : NA ? ?Discharge Placement ?  ?           ?  ?  ?  ?  ? ?Discharge Plan and Services ?  ?  ?           ?DME Arranged: N/A ?DME Agency: NA ?  ?  ?  ?HH Arranged: NA ?[place] Agency: NA ?  ?  ?  ? ?Social Determinants of Health (SDOH) Interventions ?  ? ? ?Readmission Risk Interventions ?   ? View : No data to display.  ?  ?  ?  ? ? ? ? ? ?

## 2022-04-02 NOTE — Plan of Care (Signed)
Patient sleeping between care. Moderate pain. Dressing C/D/I. No new changes in assessment. Call bell within reach. ? ?PLAN OF CARE ONGOING ?Problem: Education: ?Goal: Knowledge of General Education information will improve ?Description: Including pain rating scale, medication(s)/side effects and non-pharmacologic comfort measures ?Outcome: Progressing ?  ?Problem: Health Behavior/Discharge Planning: ?Goal: Ability to manage health-related needs will improve ?Outcome: Progressing ?  ?Problem: Clinical Measurements: ?Goal: Ability to maintain clinical measurements within normal limits will improve ?Outcome: Progressing ?Goal: Will remain free from infection ?Outcome: Progressing ?Goal: Diagnostic test results will improve ?Outcome: Progressing ?Goal: Respiratory complications will improve ?Outcome: Progressing ?Goal: Cardiovascular complication will be avoided ?Outcome: Progressing ?  ?Problem: Activity: ?Goal: Risk for activity intolerance will decrease ?Outcome: Progressing ?  ?Problem: Nutrition: ?Goal: Adequate nutrition will be maintained ?Outcome: Progressing ?  ?Problem: Coping: ?Goal: Level of anxiety will decrease ?Outcome: Progressing ?  ?Problem: Elimination: ?Goal: Will not experience complications related to bowel motility ?Outcome: Progressing ?Goal: Will not experience complications related to urinary retention ?Outcome: Progressing ?  ?Problem: Pain Managment: ?Goal: General experience of comfort will improve ?Outcome: Progressing ?  ?Problem: Safety: ?Goal: Ability to remain free from injury will improve ?Outcome: Progressing ?  ?Problem: Skin Integrity: ?Goal: Risk for impaired skin integrity will decrease ?Outcome: Progressing ?  ?

## 2022-04-02 NOTE — Progress Notes (Addendum)
1309 ?Pt refuses COVID vaccine at this time  ? ?1317 ?Avs reviewed with pt and wife all questions and concerns answered. IV removed surgical boot delivered  ? ?6435 ?Pt has picked up home meds from pharmacy  ?

## 2022-04-02 NOTE — Progress Notes (Signed)
?  Subjective:  ?Patient ID: Albert Wade, male    DOB: April 03, 1946,  [name]: [id number] ? ?Patient seen at bedside resting comfortably feels he still has the big toe there but not having much pain ? ?Negative for chest pain and shortness of breath ?Chest pain: no ?Shortness of breath: no ?Fever: no ?Night sweats: no ?Weight loss: no ?Constitutional signs: no ?Objective:  ? ?Vitals:  ? 04/02/22 0423 04/02/22 0750  ?BP: 121/60 133/62  ?Pulse: 66 66  ?Resp:  14  ?Temp: 98.1 ?F (36.7 ?C) 98.4 ?F (36.9 ?C)  ?SpO2: 98% 96%  ? ?General AA&O x3. Normal mood and affect.  ?Vascular Dorsalis pedis and posterior tibial pulses 2/4 bilat. ?Brisk capillary refill to all digits. Pedal hair present.  ?Neurologic Epicritic sensation grossly intact.  ?Dermatologic Dressing clean dry and intact  ?Orthopedic: MMT 5/5 in dorsiflexion, plantarflexion, inversion, and eversion. ?Normal joint ROM without pain or crepitus.  ? ? ?Assessment & Plan:  ?Patient was evaluated and treated and all questions answered. ? ?POD #1 status post right great toe amputation ?-Overall doing well ?-Need surgical shoe prior to discharge.  He may be WBAT in this.  There is an active order for this currently ?-Culture so far with no organisms seen on Gram stain may take a few days to speciate.  Recommend discharge on doxycycline 100 mg twice daily, I will follow culture and change as needed ?-My office will coordinate his follow-up on Monday for a Wednesday appointment ?-Okay to discharge home today ? ?Criselda Peaches, DPM ? ?Accessible via secure chat for questions or concerns. ? ?

## 2022-04-04 ENCOUNTER — Ambulatory Visit: Payer: Medicare Other | Admitting: Dermatology

## 2022-04-04 ENCOUNTER — Other Ambulatory Visit: Payer: Self-pay

## 2022-04-05 ENCOUNTER — Telehealth: Payer: Self-pay | Admitting: *Deleted

## 2022-04-05 ENCOUNTER — Telehealth: Payer: Self-pay

## 2022-04-05 LAB — SURGICAL PATHOLOGY

## 2022-04-05 NOTE — Chronic Care Management (AMB) (Signed)
?  Care Management  ? ?Note ? ?04/05/2022 ?Name: Albert Wade [name]: [id number] DOB: Oct 25, 1946 ? ?Albert Wade is a 76 y.o. year old male who is a primary care patient of Biagio Borg, MD. I reached out to [name] by phone today offer care coordination services.  ? ?[name] was given information about care management services today including:  ?Care management services include personalized support from designated clinical staff supervised by his physician, including individualized plan of care and coordination with other care providers ?24/7 contact phone numbers for assistance for urgent and routine care needs. ?The patient may stop care management services at any time by phone call to the office staff. ? ?Patient agreed to services and verbal consent obtained.  ? ?Follow up plan: ?Telephone appointment with care management team member scheduled for: 04/08/2022 ? ?Bryton Romagnoli, CCMA ?Care Guide, Embedded Care Coordination ?[facility]  ?Direct Dial: [phone] ? ? ?

## 2022-04-05 NOTE — Telephone Encounter (Signed)
Received a call from this patient?s wife. She said that the wound became infected and they performed the surgery at the hospital last week. Surgery with Dr. Posey Pronto has been canceled  ?

## 2022-04-06 ENCOUNTER — Ambulatory Visit (INDEPENDENT_AMBULATORY_CARE_PROVIDER_SITE_OTHER): Payer: Medicare Other | Admitting: Podiatry

## 2022-04-06 ENCOUNTER — Encounter: Payer: Self-pay | Admitting: Podiatry

## 2022-04-06 VITALS — Temp 98.2°F

## 2022-04-06 DIAGNOSIS — M86171 Other acute osteomyelitis, right ankle and foot: Secondary | ICD-10-CM

## 2022-04-06 LAB — AEROBIC/ANAEROBIC CULTURE W GRAM STAIN (SURGICAL/DEEP WOUND): Gram Stain: NONE SEEN

## 2022-04-08 ENCOUNTER — Telehealth: Payer: Self-pay | Admitting: *Deleted

## 2022-04-08 ENCOUNTER — Telehealth: Payer: Medicare Other

## 2022-04-08 ENCOUNTER — Encounter: Payer: Self-pay | Admitting: *Deleted

## 2022-04-08 NOTE — Telephone Encounter (Signed)
?  Care Management  ? ?Follow Up Note ? ? ?04/08/2022 ?Name: Albert Wade [name]: [id number] DOB: [birth year] ? ?Referred by: Biagio Borg, MD ?Reason for referral : Care Coordination (Clinic RN CM Initial Outreach attempt: Unsuccessful attempt) ? ?An unsuccessful telephone outreach was attempted today. The patient was referred to the case management team for assistance with care management and care coordination.  ? ?Follow Up Plan:  ?A HIPPA compliant phone message was left for the patient providing contact information and requesting a return call ?Will place request with scheduling care guide to contact patient to re-schedule today's missed CCM RN initial telephone appointment if I do not hear back from patient by end of day ? ?Oneta Rack, RN, BSN, CCRN Alumnus ?[place] ?([phone]: direct office ? ? ?

## 2022-04-09 ENCOUNTER — Encounter: Payer: Self-pay | Admitting: Podiatry

## 2022-04-09 NOTE — Progress Notes (Signed)
?  Subjective:  ?Patient ID: Albert Wade, male    DOB: 1946/12/22,  [name]: [id number] ? ?Chief Complaint  ?Patient presents with  ? Routine Post Op  ? ? ?DOS: 04/01/2022 ?Procedure: Right great toe amputation ? ?76 y.o. male returns for post-op check.  Overall doing well, not having too much pain, still is trying to come to terms losing the toe ? ?Review of Systems: Negative except as noted in the HPI. Denies N/V/F/Ch. ? ? ?Objective:  ? ?Vitals:  ? 04/06/22 1319  ?Temp: 98.2 ?F (36.8 ?C)  ? ?There is no height or weight on file to calculate BMI. ?Constitutional Well developed. ?Well nourished.  ?Vascular Foot warm and well perfused. ?Capillary refill normal to all digits.  Calf is soft and supple, no posterior calf or knee pain, negative Homans' sign  ?Neurologic Normal speech. ?Oriented to person, place, and time. ?Epicritic sensation to light touch grossly present bilaterally.  ?Dermatologic Skin healing well without signs of infection. Skin edges well coapted without signs of infection.  ?Orthopedic: Tenderness to palpation noted about the surgical site.  ? ? ?Assessment:  ?No diagnosis found. ?Plan:  ?Patient was evaluated and treated and all questions answered. ? ?S/p foot surgery right ?-Progressing as expected post-operatively. ?-WB Status: WBAT in surgical shoe ?-Sutures: Plan to remove in 2 weeks. ?-Medications: No refills required he will finish his current antibiotics ?-Foot redressed. ? ?Return in about 2 weeks (around 04/20/2022) for post op (no x-rays), suture removal.  ?

## 2022-04-14 ENCOUNTER — Ambulatory Visit: Payer: Medicare Other | Admitting: *Deleted

## 2022-04-14 DIAGNOSIS — I509 Heart failure, unspecified: Secondary | ICD-10-CM

## 2022-04-15 NOTE — Chronic Care Management (AMB) (Signed)
 "  Care Management    RN Visit Note  04/15/2022 Name: Albert Wade [name]: [id number] DOB: 08/31/1946  Subjective: Albert Wade is a 76 y.o. year old male who is a primary care patient of Norleen Lynwood LELON, MD. The care management team was consulted for assistance with disease management and care coordination needs.    Engaged with patient by telephone for initial visit in response to provider referral for case management and/or care coordination services.   Consent to Services:   Albert Wade was given information about Care Management services 04/05/22 including:  Care Management services includes personalized support from designated clinical staff supervised by his physician, including individualized plan of care and coordination with other care providers 24/7 contact phone numbers for assistance for urgent and routine care needs. The patient may stop case management services at any time by phone call to the office staff.  Patient agreed to services and consent obtained.   Assessment: Review of patient past medical history, allergies, medications, health status, including review of consultants reports, laboratory and other test data, was performed as part of comprehensive evaluation and provision of chronic care management services.   SDOH (Social Determinants of Health) assessments and interventions performed:  SDOH Interventions    Flowsheet Row Most Recent Value  SDOH Interventions   Food Insecurity Interventions Intervention Not Indicated  [denies food insecurity]  Housing Interventions Intervention Not Indicated  [lives with spouse only in single family one level home x 18 years,  denies concerns around home safety]  Transportation Interventions Intervention Not Indicated  [Drives self]     Care Plan  Allergies  Allergen Reactions   Cyclobenzaprine     agitation    Seroquel [Quetiapine] Hives   Outpatient Encounter Medications as of 04/14/2022  Medication Sig Note    albuterol  (VENTOLIN  HFA) 108 (90 Base) MCG/ACT inhaler INHALE 2 PUFFS INTO THE LUNGS 4 TIMES DAILY AS NEEDED    ALPRAZolam  (XANAX ) 0.5 MG tablet Take 0.5 mg by mouth 2 (two) times daily as needed for anxiety.    amLODipine  (NORVASC ) 2.5 MG tablet Take 1 tablet (2.5 mg total) by mouth daily.    aspirin  81 MG tablet Take 81 mg by mouth daily.    blood glucose meter kit and supplies KIT Dispense based on patient and insurance preference. Use up to four times daily as directed.    doxycycline  (VIBRA -TABS) 100 MG tablet Take 100 mg by mouth 2 (two) times daily.    DULoxetine  (CYMBALTA ) 60 MG capsule Take 60 mg by mouth 2 (two) times daily.     FLOVENT  HFA 44 MCG/ACT inhaler Inhale 2 puffs into the lungs 2 (two) times daily. As needed (Patient not taking: Reported on 04/14/2022) 04/14/2022: 04/14/22: reports not taking- states insurance company would not cover   furosemide  (LASIX ) 40 MG tablet Take 1 tablet (40 mg total) by mouth 2 (two) times daily.    omeprazole  (PRILOSEC) 20 MG capsule Take 1 capsule (20 mg total) by mouth 2 (two) times daily.    oxyCODONE -acetaminophen  (PERCOCET) 10-325 MG tablet Take 1 tablet by mouth every 4 (four) hours as needed for pain. (Patient taking differently: Take 2 tablets by mouth 4 (four) times daily. Max 8 per day)    simvastatin  (ZOCOR ) 40 MG tablet Take 1 tablet (40 mg total) by mouth at bedtime.    traZODone  (DESYREL ) 50 MG tablet Take 1 tablet (50 mg total) by mouth at bedtime as needed for sleep. (Patient taking differently: Take 150 mg  by mouth at bedtime as needed for sleep. Take 2-3 tablets at bedtime as needed)    No facility-administered encounter medications on file as of 04/14/2022.   Patient Active Problem List   Diagnosis Date Noted   Cellulitis of right foot 04/01/2022   Acute osteomyelitis of toe of right foot (HCC) 03/31/2022   Overweight (BMI 25.0-29.9) 03/31/2022   Abnormal LFTs 10/14/2021   Chronic diastolic CHF (congestive heart failure) (HCC)  10/14/2021   COPD (chronic obstructive pulmonary disease) (HCC)    CAD (coronary artery disease)    Acute metabolic encephalopathy    Hypokalemia    HTN (hypertension)    Elevated lactic acid level    Body aches 10/13/2021   GI obstruction (HCC) 02/15/2021   RUQ pain 11/09/2020   Ureteral stricture, right 06/01/2020   Weight loss 10/08/2019   Abdominal pain 10/08/2019   Therapeutic opioid induced constipation 10/08/2019   Fever 04/27/2017   URI (upper respiratory infection) 04/27/2017   Slowing, urinary stream 04/27/2017   Confusion 04/27/2017   Insomnia 04/27/2017   Prostate cancer (HCC) 01/27/2017   UTI (urinary tract infection) 12/01/2016   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/2016   Lumbosacral radiculopathy 10/14/2016   Varicose veins of bilateral lower extremities with other complications 10/14/2016   Umbilical hernia without obstruction and without gangrene 05/19/2016   Hyperglycemia 05/19/2016   Acute upper respiratory infection 05/19/2016   Gynecomastia 05/12/2015   Encounter for preventative adult health care exam with abnormal findings 12/20/2013   GERD (gastroesophageal reflux disease)    Anxiety    Chronic back pain    Depression with anxiety    Benign prostatic hyperplasia    Peripheral neuropathy    Phimosis/adherent prepuce 10/21/2013   OSA (obstructive sleep apnea) 05/13/2011   Coronary atherosclerosis 10/16/2009   HLD (hyperlipidemia) 04/29/2008   MYOCARDIAL INFARCTION 04/29/2008   BRONCHITIS, CHRONIC 04/29/2008   Conditions to be addressed/monitored:   CHF and CAD  Care Plan : RN Care Manager Plan of Care  Updates made by Albert Beatris HERO, RN since 04/15/2022 12:00 AM     Problem: Chronic Disease Management Needs   Priority: High     Long-Range Goal: Development of plan of care for long term chronic disease management   Start Date: 04/14/2022  Expected End Date: 04/15/2023  Priority: High  Note:   Current Barriers:  Chronic Disease  Management support and education needs related to CHF and CAD Recent hospitalization April 6-8, 2023 for (R) great toe amputation due to osteomyelitis; discharged home to self-care without home health services Chronic pain- followed by established pain management provider in [place]: attends routine visits with provider every 3 months  RNCM Clinical Goal(s):  Patient will demonstrate ongoing health management independence as evidenced by adherence to plan of care for self-health management of CHF/ CAD        through collaboration with RN Care manager, provider, and care team  Interventions: 1:1 collaboration with primary care provider regarding development and update of comprehensive plan of care as evidenced by provider attestation and co-signature Inter-disciplinary care team collaboration (see longitudinal plan of care) Evaluation of current treatment plan related to  self management and patient's adherence to plan as established by provider Review of patient status, including review of consultants reports, relevant laboratory and other test results, and medications completed 04/14/22: RN CM initial assessment completed SDOH assessment completed: no unmet concerns identified Depression screening completed: no concerns identified Pain assessment updated: reports chronic back pain x  many years; confirms he is active with pain management provider located in [place], [place]; reports he believes his pain is managed well with established pain medications/ rest; he denies significant pain from recent (R) great toe amputation Falls assessment updated: she continues to deny new/ recent falls x 12 months- continues using ;  positive reinforcement provided with encouragement to continue efforts at fall prevention; previously provided education around fall risks/ prevention reinforced Medications discussed: reports he independently self-manages medications; takes directly out of pill Rx bottles;  denies current concerns/ issues/ questions around medications; endorses adherence to taking all medications as prescribed Reviewed recent hospitalization/ post-discharge instructions: he verbalizes a good understanding of same and denies questions Discussed signs/ symptoms wound infection- he denies all; discussed action plan for development of concerns- call orthopedic/ podiatry surgical providers Confirms he continues wearing foot boot as instructed Confirmed patient attended recent post-op provider appointment with podiatry provider 04/06/22- confirmed patient is aware of follow up appointment for suture removal 04/20/22 Reviewed upcoming scheduled provider appointments: 04/20/22- podiatry provider for suture removal, post-op check ; patient confirms is aware of all and has plans to attend as scheduled Noted patient's last PCP office visit 07/21/21- PCP had recommended follow up in 3 months; reminded patient to schedule PCP office visit- he states he will do Discussed plans with patient for ongoing care management follow up and provided patient with direct contact information for care management team    Heart Failure Interventions:  (Status: 04/14/22: New goal.)  Long Term Goal  Wt Readings from Last 3 Encounters:  04/01/22 185 lb (83.9 kg)  10/16/21 205 lb 14.6 oz (93.4 kg)  09/16/21 209 lb 12.8 oz (95.2 kg)  Basic overview and discussion of pathophysiology of Heart Failure reviewed Provided education on low sodium diet Reviewed Heart Failure Action Plan in depth and provided written copy Assessed need for readable accurate scales in home Advised patient to weigh each morning after emptying bladder Discussed importance of daily weight and advised patient to weigh and record daily Reviewed role of diuretics in prevention of fluid overload and management of heart failure Discussed the importance of keeping all appointments with provider Provided patient with education about the role of exercise in  the management of heart failure Confirmed patient routinely monitors/ records weights at home, every day or every other day; verbalizes a good baseline understanding of purpose/ rationale for daily weight monitoring; however, he will benefit from ongoing education/ support/ reminding Reviewed recent weights at home: he reports consistent weights between 185-188 lbs, with a most recent weight of 187 lbs Provided education around signs/ symptoms yellow CHF zone along with corresponding action plan; explained that weight gain is often the earliest indicator of fluid retention/ need to take action early when he notices weight gain at home: he currently denies signs/ symptoms of yellow CHF zone Confirms patient is not currently using maintenance inhaler- reports uses rescue inhaler only as needed; reports uses approximately twice a week; patient states is not sure if he needs a maintenance inhaler: encouraged him to discuss with PCP Confirmed adherence to diuretic regimen: takes at 7:00 am and 4:00 pm Confirmed patient reports adherence to prescribed diet: positive reinforcement provided with encouragement to continue efforts Confirmed patient is not currently active as he recuperates from recent toe amputation: provided education around benefits of activity/ exercise in setting of CAD/ CHF- encouraged him to discuss recommended activity level as he recuperates from recent surgery and to not over-do activity until his care providers have  cleared him from a surgical standpoint: he verbalizes understanding and agreement  Patient Goals/Self-Care Activities: As evidenced by review of EHR, collaboration with care team, and patient reporting during CCM RN CM outreach,  Patient Abby will: Take medications as prescribed Attend all scheduled provider appointments Call pharmacy for medication refills Call provider office for new concerns or questions Continue to check daily weights at home: call your  cardiologist if you notice weight gain > 3 lbs overnight or 5 lbs in one week Continue to follow heart healthy, low salt, low cholesterol, carbohydrate-modified, low sugar diet Review enclosed educational material about the importance of daily weights when you have heart problems      Plan:  Telephone follow up appointment with care management team member scheduled for:  Monday, May 02, 2022 at 9:00 am The patient has been provided with contact information for the care management team and has been advised to call with any health related questions or concerns  [name], RN, BSN, CCRN Alumnus CCM Clinic RN Care Coordination- [facility] [place] [phone]: direct office           "

## 2022-04-15 NOTE — Patient Instructions (Signed)
 Visit Information  Albert Wade, thank you for taking time to talk with me today. Please don't hesitate to contact me if I can be of assistance to you before our next scheduled telephone appointment  Below are the goals we discussed today:  Patient Self-Care Activities: Patient Albert Wade will: Take medications as prescribed Attend all scheduled provider appointments Call pharmacy for medication refills Call provider office for new concerns or questions Continue to check daily weights at home: call your cardiologist if you notice weight gain > 3 lbs overnight or 5 lbs in one week Continue to follow heart healthy, low salt, low cholesterol, carbohydrate-modified, low sugar diet Review enclosed educational material about the importance of daily weights when you have heart problems  Our next scheduled telephone follow up visit/ appointment is scheduled on: Monday, May 02, 2022 at 9:00 am- This is a PHONE CALL appointment  If you need to cancel or re-schedule our visit, please call [phone] and our care guide team will be happy to assist you.   I look forward to hearing about your progress.   [name], RN, BSN, CCRN [facility] RN Care Coordination- [facility] [phone]: direct office  If you are experiencing a Mental Health or Behavioral Health Crisis or need someone to talk to, please  call the Suicide and Crisis Lifeline: 988 call the [place]  National Suicide Prevention Lifeline: 828-194-6320 or TTY: (636) 398-5169 TTY (337)575-8879) to talk to a trained counselor call 1-800-273-TALK (toll free, 24 hour hotline) go to [facility] [address] [place] [phone]) call 911   Heart Failure Action Plan A heart failure action plan helps you understand what to do when you have symptoms of heart failure. Your action plan is a color-coded plan that lists the symptoms to watch for and indicates what actions to take. If you have  symptoms in the red zone, you need medical care right away. If you have symptoms in the yellow zone, you are having problems. If you have symptoms in the green zone, you are doing well. Follow the plan that was created by you and your health care provider. Review your plan each time you visit your health care provider. Red zone These signs and symptoms mean you should get medical help right away: You have trouble breathing when resting. You have a dry cough that is getting worse. You have swelling or pain in your legs or abdomen that is getting worse. You suddenly gain more than 2-3 lb (0.9-1.4 kg) in 24 hours, or more than 5 lb (2.3 kg) in a week. This amount may be more or less depending on your condition. You have trouble staying awake or you feel confused. You have chest pain. You do not have an appetite. You pass out. You have worsening sadness or depression. If you have any of these symptoms, call your local emergency services (911 in the [place] right away. Do not drive yourself to the hospital. Yellow zone These signs and symptoms mean your condition may be getting worse and you should make some changes: You have trouble breathing when you are active, or you need to sleep with your head raised on extra pillows to help you breathe. You have swelling in your legs or abdomen. You gain 2-3 lb (0.9-1.4 kg) in 24 hours, or 5 lb (2.3 kg) in a week. This amount may be more or less depending on your condition. You get tired easily. You have trouble sleeping. You have a dry cough.  If you have any of these symptoms: Contact your health care provider within the next day. Your health care provider may adjust your medicines. Green zone These signs mean you are doing well and can continue what you are doing: You do not have shortness of breath. You have very little swelling or no new swelling. Your weight is stable (no gain or loss). You have a normal activity level. You do not have chest  pain or any other new symptoms. Follow these instructions at home: Take over-the-counter and prescription medicines only as told by your health care provider. Weigh yourself daily. Your target weight is __________ lb (__________ kg). Call your health care provider if you gain more than __________ lb (__________ kg) in 24 hours, or more than __________ lb (__________ kg) in a week. Health care provider name: _____________________________________________________ Health care provider phone number: _____________________________________________________ Eat a heart-healthy diet. Work with a diet and nutrition specialist (dietitian) to create an eating plan that is best for you. Keep all follow-up visits. This is important. Where to find more information American Heart Association: www.heart.org Summary A heart failure action plan helps you understand what to do when you have symptoms of heart failure. Follow the action plan that was created by you and your health care provider. Get help right away if you have any symptoms in the red zone. This information is not intended to replace advice given to you by your health care provider. Make sure you discuss any questions you have with your health care provider. Document Revised: 07/27/2020 Document Reviewed: 07/27/2020 Elsevier Patient Education  2023 [employer].  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; discharged home  to self-care without home health services Chronic pain- followed by established pain management provider in [place]: attends routine visits with provider every 3 months  RNCM Clinical Goal(s):  Patient will demonstrate ongoing health management independence as evidenced by adherence to plan of care for self-health management of CHF/ CAD        through collaboration with RN Care manager, provider, and care team  Interventions: 1:1 collaboration with primary care provider regarding development and update of comprehensive plan of care as evidenced by provider attestation and co-signature Inter-disciplinary care team collaboration (see longitudinal plan of care) Evaluation of current treatment plan related to  self management and patient's adherence to plan as established by provider Review of patient status, including review of consultants reports, relevant laboratory and other test results, and medications completed 04/14/22: RN CM initial assessment completed SDOH assessment completed: no unmet concerns identified Pain assessment updated: reports chronic back pain x many years; confirms he is active with pain management provider located in [place], [place]; reports he believes his pain is managed well with established pain medications/ rest; he denies significant pain from recent (R) great toe amputation Falls assessment updated: she continues to deny new/ recent falls x 12 months- continues using ;  positive reinforcement provided with encouragement to continue efforts at fall prevention; previously provided education around fall risks/ prevention reinforced Medications discussed: reports he independently self-manages medications; takes directly out of pill Rx bottles; denies current concerns/ issues/ questions around medications; endorses adherence to taking all medications as prescribed Reviewed recent hospitalization/ post-discharge instructions: he verbalizes a good understanding of same  and denies questions Discussed signs/ symptoms wound infection- he denies all; discussed action plan for development of concerns- call orthopedic/ podiatry surgical providers Confirms he continues wearing foot boot as instructed Confirmed patient attended recent post-op provider appointment with podiatry provider 04/06/22- confirmed patient is aware of follow up appointment for suture removal 04/20/22 Reviewed upcoming scheduled provider appointments: 04/20/22- podiatry provider for suture removal, post-op check ; patient confirms is aware of all and has plans to attend as scheduled Noted patient's last PCP office visit 07/21/21- PCP had recommended follow up in 3 months; reminded patient to schedule PCP office visit- he states he will do Discussed plans with patient for ongoing care management follow up and provided patient with direct contact information for care management team    Heart Failure Interventions:  (Status: 04/14/22: New goal.)  Long Term Goal  Wt Readings from Last 3 Encounters:  04/01/22 185 lb (83.9 kg)  10/16/21 205 lb 14.6 oz (93.4 kg)  09/16/21 209 lb 12.8 oz (95.2 kg)  Basic overview and discussion of pathophysiology of Heart Failure reviewed Provided education on low sodium diet Reviewed Heart Failure Action Plan in depth and provided written copy Assessed need for readable accurate scales in home Advised patient to weigh each morning after emptying bladder Discussed importance of daily weight and advised patient to weigh and record daily Reviewed role of diuretics in prevention of fluid overload and management of heart failure Discussed the importance of keeping all appointments with provider Provided patient with education about the role of exercise in the management of heart failure Confirmed patient routinely monitors/ records weights at home, every day or every other day; verbalizes a good baseline understanding of purpose/ rationale for daily weight monitoring;  however, he will benefit from ongoing education/ support/ reminding Reviewed recent weights at home: he reports consistent weights between 185-188 lbs, with a most recent weight of 187 lbs Provided education around signs/ symptoms yellow CHF zone along with corresponding action plan; explained that weight gain is often the earliest indicator of fluid retention/ need to take action early when he notices weight gain at home: he currently denies signs/ symptoms of yellow CHF zone Confirms patient is not currently using maintenance inhaler- reports uses rescue inhaler only as needed; reports uses approximately twice a week; patient states is not sure if he needs a maintenance inhaler: encouraged him to discuss with PCP Confirmed adherence to diuretic regimen: takes at 7:00 am and 4:00 pm Confirmed patient reports adherence to prescribed diet: positive reinforcement provided with encouragement to continue efforts Confirmed patient is not currently active as he recuperates from recent toe amputation: provided education around benefits of activity/ exercise in setting of CAD/ CHF- encouraged him to discuss recommended activity level as he recuperates from recent surgery and to not over-do activity until his care providers have cleared him from a surgical standpoint: he verbalizes understanding and agreement  Patient Goals/Self-Care Activities: As evidenced by review of EHR, collaboration with care team, and patient reporting during CCM RN CM outreach,  Patient Albert Wade will: Take medications as prescribed Attend all scheduled provider appointments Call pharmacy for medication refills Call provider office for  new concerns or questions Continue to check daily weights at home: call your cardiologist if you notice weight gain > 3 lbs overnight or 5 lbs in one week Continue to follow heart healthy, low salt, low cholesterol, carbohydrate-modified, low sugar diet Review enclosed educational material about the  importance of daily weights when you have heart problems      Mr. Frieden was given information about Care Management services 04/05/22 by the embedded care coordination team including:  Care Management services include personalized support from designated clinical staff supervised by his physician, including individualized plan of care and coordination with other care providers 24/7 contact phone numbers for assistance for urgent and routine care needs. The patient may stop CCM services at any time (effective at the end of the month) by phone call to the office staff.  Patient agreed to services and verbal consent obtained.   Patient verbalizes understanding of instructions and care plan provided today and agrees to view in MyChart. Active MyChart status confirmed with patient   Telephone follow up appointment with care management team member scheduled for:  Monday, May 02, 2022 at 9:00 am The patient has been provided with contact information for the care management team and has been advised to call with any health related questions or concerns

## 2022-04-20 ENCOUNTER — Ambulatory Visit (INDEPENDENT_AMBULATORY_CARE_PROVIDER_SITE_OTHER): Payer: Medicare Other | Admitting: Podiatry

## 2022-04-20 DIAGNOSIS — M86171 Other acute osteomyelitis, right ankle and foot: Secondary | ICD-10-CM

## 2022-04-20 NOTE — Progress Notes (Signed)
?  Subjective:  ?Patient ID: Albert Wade, male    DOB: [birth year],  [name]: [id number] ? ?Chief Complaint  ?Patient presents with  ? Routine Post Op  ?    Post op -suture removal right   ? ? ?DOS: 04/01/2022 ?Procedure: Right great toe amputation ? ?76 y.o. male returns for post-op check.  Overall doing well, pain is controlled ? ?Review of Systems: Negative except as noted in the HPI. Denies N/V/F/Ch. ? ? ?Objective:  ? ?There were no vitals filed for this visit. ? ?There is no height or weight on file to calculate BMI. ?Constitutional Well developed. ?Well nourished.  ?Vascular Foot warm and well perfused. ?Capillary refill normal to all digits.  Calf is soft and supple, no posterior calf or knee pain, negative Homans' sign  ?Neurologic Normal speech. ?Oriented to person, place, and time. ?Epicritic sensation to light touch grossly present bilaterally.  ?Dermatologic Skin healing well without signs of infection. Skin edges well coapted without signs of infection.  ?Orthopedic: Tenderness to palpation noted about the surgical site.  ? ? ?Assessment:  ? ?1. Acute osteomyelitis of toe of right foot ([place])   ? ?Plan:  ?Patient was evaluated and treated and all questions answered. ? ?S/p foot surgery right ?-Doing well status post right hallux amputation.  Sutures and staples removed uneventfully today.  He may return to regular shoe gear as tolerated.  Advised on the possibility of drift medially of the second toe and risk of recurrent ulceration he will monitor this closely and I will see him back as needed. ? ?Return if symptoms worsen or fail to improve.  ?

## 2022-04-25 ENCOUNTER — Other Ambulatory Visit: Payer: Self-pay | Admitting: Gastroenterology

## 2022-04-26 ENCOUNTER — Encounter: Payer: Medicare Other | Admitting: Podiatry

## 2022-05-02 ENCOUNTER — Telehealth: Payer: Medicare Other

## 2022-05-04 DIAGNOSIS — M25571 Pain in right ankle and joints of right foot: Secondary | ICD-10-CM | POA: Diagnosis not present

## 2022-05-04 DIAGNOSIS — Z79899 Other long term (current) drug therapy: Secondary | ICD-10-CM | POA: Diagnosis not present

## 2022-05-04 DIAGNOSIS — K5909 Other constipation: Secondary | ICD-10-CM | POA: Diagnosis not present

## 2022-05-04 DIAGNOSIS — M47812 Spondylosis without myelopathy or radiculopathy, cervical region: Secondary | ICD-10-CM | POA: Diagnosis not present

## 2022-05-04 DIAGNOSIS — M542 Cervicalgia: Secondary | ICD-10-CM | POA: Diagnosis not present

## 2022-05-04 DIAGNOSIS — M48061 Spinal stenosis, lumbar region without neurogenic claudication: Secondary | ICD-10-CM | POA: Diagnosis not present

## 2022-05-04 DIAGNOSIS — G894 Chronic pain syndrome: Secondary | ICD-10-CM | POA: Diagnosis not present

## 2022-05-09 ENCOUNTER — Ambulatory Visit: Payer: Medicare Other | Admitting: *Deleted

## 2022-05-09 DIAGNOSIS — I509 Heart failure, unspecified: Secondary | ICD-10-CM

## 2022-05-09 NOTE — Patient Instructions (Signed)
 Visit Information  Albert Wade, thank you for taking time to talk with me today about [name] health care needs. Please don't hesitate to contact me if I can be of assistance to you before our next scheduled telephone appointment.  Below are the goals we discussed today:  Patient Self-Care Activities: Patient Albert Wade will: Take medications as prescribed Attend all scheduled provider appointments Call pharmacy for medication refills Call provider office for new concerns or questions Continue to check daily weights at home: call your cardiologist if you notice weight gain > 3 lbs overnight or 5 lbs in one week- the weight you reported today was 183 lbs Continue to follow heart healthy, low salt, low cholesterol, carbohydrate-modified, low sugar diet Keep up the great work preventing falls   Our next scheduled telephone follow up visit/ appointment is scheduled on: Monday, June 13, 2022 at 10:30 am- This is a PHONE CALL appointment  If you need to cancel or re-schedule our visit, please call [phone] and our care guide team will be happy to assist you.   I look forward to hearing about your progress.   [name], RN, BSN, CCRN [facility] RN Care Coordination- [facility] [phone]: direct office  If you are experiencing a Mental Health or Behavioral Health Crisis or need someone to talk to, please  call the Suicide and Crisis Lifeline: 988 call the [place]  National Suicide Prevention Lifeline: 902-663-3574 or TTY: (906) 393-9123 TTY 680-836-9017) to talk to a trained counselor call 1-800-273-TALK (toll free, 24 hour hotline) go to [facility] [address], [place] [phone]) call 911   Patient verbalizes understanding of instructions and care plan provided today and agrees to view in MyChart. Active MyChart status confirmed with patient Telephone follow up appointment with care management team member scheduled for:   Monday, June 13, 2022 at 10:30 am The patient has been provided with contact information for the care management team and has been advised to call with any health related questions or concerns   Living With Heart Failure Heart failure is a long-term (chronic) condition in which the heart cannot pump enough blood through the body. When this happens, parts of the body do not get the blood and oxygen they need. There is no cure for heart failure at this time, so it is important for you to take good care of yourself and follow the treatment plan you set with your health care provider. If you are living with heart failure, there are ways to help you manage the disease. How to manage lifestyle changes Living with heart failure requires you to make changes in your life. Your health care team will teach you about the changes you need to make in order to relieve your symptoms and lower your risk of going to the hospital. Work with your health care provider to develop a treatment plan that works for you. Activity Ask your health care provider about attending cardiac rehabilitation. These programs include aerobic physical activity, which provides many benefits for your heart. If no cardiac rehabilitation program is available, ask your health care provider what aerobic exercises are safe for you to do. Return to your normal activities as told by your health care provider. Ask your health care provider what activities are safe for you. Pace your daily activities and allow time for rest as needed. Managing stress It is normal to have many emotions about your diagnosis, such as fear, sadness, anger, and loss. If you feel any  of these emotions and need help coping, contact your health care provider. Here are some ways to help yourself manage these emotions: Talk to friends and family members about your condition. They can give you support and guidance. Explain your symptoms to them and, if comfortable, invite them to  attend appointments or rehabilitation with you. Join a support group for people with chronic heart failure. Talking with other people who have the same symptoms may give you new ways of coping with your disease and your emotions. Accept help from others. Do not be ashamed if you need help with certain tasks. Use stress management techniques, such as meditation, breathing exercises, or listening to relaxing music. Conditions such as depression and anxiety are common in persons with heart failure. Pay attention to changes in your mood, emotions, and stress levels. Tell your health care provider if you have any of the following symptoms: Trouble sleeping or a change in your sleeping patterns. Feeling sad, down, or depressed more often than not, every day for more than 2 weeks. Losing interest in activities you normally enjoy. Feeling irritable or crying for no reason. Finding yourself worrying about the future often. Work You may need to develop a plan with your health care provider if heart failure interferes with your ability to work. This may include: Reducing your work hours. Finding functions that are less active or require less effort. Planning rest periods during your work hours. Travel Talk with your health care provider if you plan to travel. There may be circumstances in which your health care provider recommends that you do not travel or that you delay travel until your condition is under control. When you travel, bring your medicine and a list of your medicines. If you are traveling by air, keep your medicines with you in a carry-on bag. Consider finding a medical facility in the area you will be traveling to and determine what your health insurance will cover. If you will be traveling by public transportation (airplane, train, bus), contact the company prior to traveling if you have special needs. This may include needs related to diet, oxygen, a wheelchair, a seating request, or help  with luggage. If you use oxygen, make sure to bring enough oxygen with you. If you have a battery-powered device, bring a fully charged extra battery with you. If you have a device, bring a note from your health care provider and inform all security screening personnel that you have the device. You may need to go through special screening for safety. Sexual activity  Ask your health care provider when it is safe for you to resume sexual activity. You may need to start slowly and gradually increase intimacy. You can increase intimacy by doing such things as caressing, touching, and holding each other. Get regular exercise as told by your health care provider. This can benefit your sex life by building strength and endurance. Sleep If your condition interferes with your sleep, find ways to improve your sleep quality, such as: Sleep lying on your side, or sleep with your head elevated by raising the head of your bed or using multiple pillows. Ask your health care provider about screening for sleep apnea. Try to go to sleep and wake up at the same times every day. Sleep in a dark, cool room. Do not do any physical activity or eating for a few hours before bedtime. Plan rest periods during the day, but do not take long naps during the day.  Where to find support  Consider talking with: Family members. Close friends. A mental health professional or therapist. A member of your church, faith, or community group. Other sources of support include: Local support groups. Ask your health care provider about groups near you. Online support groups, such as those found through the American Heart Association: supportnetwork.heart.org Local home care agencies, community agencies, or social agencies. A palliative care specialist. Palliative care can help you manage symptoms, promote comfort, improve quality of life, and maintain dignity. Where to find more information American Heart Association:  heart.org National Heart, Lung, and Blood Institute: buffalodrycleaner.gl Centers for Disease Control and Prevention: riphit.se Heart Failure Society of America: amregister.tn Contact a health care provider if: You have a rapid weight gain. You have increasing shortness of breath that is unusual for you. You are unable to participate in your usual physical activities. You tire easily. You have difficulty sleeping, such as: You wake up feeling short of breath. You have to use more pillows to raise your head in order to sleep. You cough more than normal, especially with physical activity. You have any swelling or more swelling in areas such as your hands, feet, ankles, or abdomen. You become dizzy or light-headed when you stand up. You have changes to your appetite. You have symptoms of depression or anxiety. Get help right away if: You have difficulty breathing. You notice or your family notices a change in your awareness, such as having trouble staying awake or having difficulty with concentration. You have pain or discomfort in your chest. You have an episode of fainting (syncope). You feel like your heart is beating quickly (palpitations). You have extreme feelings of sadness or loss of hope, or you have thoughts about hurting yourself or others. These symptoms may represent a serious problem that is an emergency. Do not wait to see if the symptoms will go away. Get medical help right away. Call your local emergency services (911 in the [place] Do not drive yourself to the hospital. Summary There is no cure for heart failure, so it is important for you to take good care of yourself and follow the treatment plan set by your health care provider. Ask your health care provider about attending cardiac rehabilitation. These programs include aerobic physical activity, which provides many benefits for your heart. It is normal to have many emotions about your diagnosis, such as fear,  sadness, anger, and loss. If you feel any of these emotions and need help coping, contact your health care provider. You may need to develop a plan with your health care provider if heart failure interferes with your ability to work. This information is not intended to replace advice given to you by your health care provider. Make sure you discuss any questions you have with your health care provider. Document Revised: 07/27/2020 Document Reviewed: 07/27/2020 Elsevier Patient Education  2023 [employer].

## 2022-05-09 NOTE — Chronic Care Management (AMB) (Signed)
? Care Management ?  ? RN Visit Note ? ?05/09/2022 ?Name: Albert Wade [name]: [id number] DOB: 04/27/1946 ? ?Subjective: ?Albert Wade is a 76 y.o. year old male who is a primary care patient of Biagio Borg, MD. The care management team was consulted for assistance with disease management and care coordination needs.   ? ?Engaged with patient's spouse Albert Wade by telephone for follow up visit in response to provider referral for case management and/or care coordination services. Patient provided verbal consent for me or any PCP team members to speak with his spouse "at any time" on 04/14/22 ? ?Consent to Services:  ? [name] was given information about Care Management services 04/05/22 including:  ?Care Management services includes personalized support from designated clinical staff supervised by his physician, including individualized plan of care and coordination with other care providers ?24/7 contact phone numbers for assistance for urgent and routine care needs. ?The patient may stop case management services at any time by phone call to the office staff. ? ?Patient agreed to services and consent obtained.  ? ?Assessment: Review of patient past medical history, allergies, medications, health status, including review of consultants reports, laboratory and other test data, was performed as part of comprehensive evaluation and provision of chronic care management services.  ?Care Plan ? ?Allergies  ?Allergen Reactions  ? Cyclobenzaprine   ?  agitation   ? Seroquel [Quetiapine] Hives  ? ?Outpatient Encounter Medications as of 05/09/2022  ?Medication Sig Note  ? albuterol (VENTOLIN HFA) 108 (90 Base) MCG/ACT inhaler INHALE 2 PUFFS INTO THE LUNGS 4 TIMES DAILY AS NEEDED   ? ALPRAZolam (XANAX) 0.5 MG tablet Take 0.5 mg by mouth 2 (two) times daily as needed for anxiety.   ? amLODipine (NORVASC) 2.5 MG tablet Take 1 tablet (2.5 mg total) by mouth daily.   ? aspirin 81 MG tablet Take 81 mg by mouth daily.   ? blood  glucose meter kit and supplies KIT Dispense based on patient and insurance preference. Use up to four times daily as directed.   ? doxycycline (VIBRA-TABS) 100 MG tablet Take 100 mg by mouth 2 (two) times daily.   ? DULoxetine (CYMBALTA) 60 MG capsule Take 60 mg by mouth 2 (two) times daily.    ? FLOVENT HFA 44 MCG/ACT inhaler Inhale 2 puffs into the lungs 2 (two) times daily. As needed (Patient not taking: Reported on 04/14/2022) 04/14/2022: 04/14/22: reports not taking- states insurance company would not cover  ? furosemide (LASIX) 40 MG tablet Take 1 tablet (40 mg total) by mouth 2 (two) times daily.   ? omeprazole (PRILOSEC) 20 MG capsule TAKE ONE CAPSULE BY MOUTH TWICE A DAY   ? oxyCODONE-acetaminophen (PERCOCET) 10-325 MG tablet Take 1 tablet by mouth every 4 (four) hours as needed for pain. (Patient taking differently: Take 2 tablets by mouth 4 (four) times daily. Max 8 per day)   ? simvastatin (ZOCOR) 40 MG tablet Take 1 tablet (40 mg total) by mouth at bedtime.   ? traZODone (DESYREL) 50 MG tablet Take 1 tablet (50 mg total) by mouth at bedtime as needed for sleep. (Patient taking differently: Take 150 mg by mouth at bedtime as needed for sleep. Take 2-3 tablets at bedtime as needed)   ? ?No facility-administered encounter medications on file as of 05/09/2022.  ? ?Patient Active Problem List  ? Diagnosis Date Noted  ? Cellulitis of right foot 04/01/2022  ? Acute osteomyelitis of toe of right foot ([place]) 03/31/2022  ?  Overweight (BMI 25.0-29.9) 03/31/2022  ? Abnormal LFTs 10/14/2021  ? Chronic diastolic CHF (congestive heart failure) ([place]) 10/14/2021  ? COPD (chronic obstructive pulmonary disease) ([place])   ? CAD (coronary artery disease)   ? Acute metabolic encephalopathy   ? Hypokalemia   ? HTN (hypertension)   ? Elevated lactic acid level   ? Body aches 10/13/2021  ? GI obstruction ([place]) 02/15/2021  ? RUQ pain 11/09/2020  ? Ureteral stricture, right 06/01/2020  ? Weight loss 10/08/2019  ? Abdominal pain  10/08/2019  ? Therapeutic opioid induced constipation 10/08/2019  ? Fever 04/27/2017  ? URI (upper respiratory infection) 04/27/2017  ? Slowing, urinary stream 04/27/2017  ? Confusion 04/27/2017  ? Insomnia 04/27/2017  ? Prostate cancer ([place]) 01/27/2017  ? UTI (urinary tract infection) 12/01/2016  ? 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 Lumbosacral radiculopathy 10/14/2016  ? Varicose veins of bilateral lower extremities with other complications 92/92/4462  ? Umbilical hernia without obstruction and without gangrene 05/19/2016  ? Hyperglycemia 05/19/2016  ? Acute upper respiratory infection 05/19/2016  ? Gynecomastia 05/12/2015  ? Encounter for preventative adult health care exam with abnormal findings 12/20/2013  ? GERD (gastroesophageal reflux disease)   ? Anxiety   ? Chronic back pain   ? Depression with anxiety   ? Benign prostatic hyperplasia   ? Peripheral neuropathy   ? Phimosis/adherent prepuce 10/21/2013  ? OSA (obstructive sleep apnea) 05/13/2011  ? Coronary atherosclerosis 10/16/2009  ? HLD (hyperlipidemia) 04/29/2008  ? MYOCARDIAL INFARCTION 04/29/2008  ? BRONCHITIS, CHRONIC 04/29/2008  ? ?Conditions to be addressed/monitored: CHF; recent (R) great toe amputation due osteomyelitis ? ?Care Plan : RN Care Manager Plan of Care  ?Updates made by Knox Royalty, RN since 05/09/2022 12:00 AM  ?  ? ?Problem: Chronic Disease Management Needs   ?Priority: High  ?  ? ?Long-Range Goal: Ongoing adherence to established plan of care for long term chronic disease management   ?Start Date: 04/14/2022  ?Expected End Date: 04/15/2023  ?Priority: High  ?Note:   ?Current Barriers:  ?Chronic Disease Management support and education needs related to CHF and CAD ?Recent hospitalization April 6-8, 2023 for (R) great toe amputation due to osteomyelitis; discharged home to self-care without home health services ?Chronic pain- followed by established pain management provider in [place]: attends routine visits  with provider every 3 months ? ?RNCM Clinical Goal(s):  ?Patient will demonstrate ongoing health management independence as evidenced by adherence to plan of care for self-health management of CHF/ CAD        through collaboration with RN Care manager, provider, and care team ? ?Interventions: ?1:1 collaboration with primary care provider regarding development and update of comprehensive plan of care as evidenced by provider attestation and co-signature ?Inter-disciplinary care team collaboration (see longitudinal plan of care) ?04/14/22: RN CM initial assessment completed ?Evaluation of current treatment plan related to  self management and patient's adherence to plan as established by provider ?Review of patient status, including review of consultants reports, relevant laboratory and other test results, and medications completed ?Pain discussed with spouse/ caregiver: she reports patient managing pain "well;" confirms no significant changes in pain management plan/ pain levels- continues visiting with pain management provider in [place] for chronic pain management needs ?Falls assessment updated: continues to deny new/ recent falls x 12 months- continues using "walking stick" for ambulation assistance, "as needed;"  positive reinforcement provided with encouragement to continue efforts at fall prevention; previously provided education around fall  risks/ prevention reinforced ?Medications discussed:  reports continues to independently self-manage and denies current concerns/ issues/ questions around medications; endorses adherence to taking all medications as prescribed ?Reviewed recent podiatry provider appointment 04/20/22- spouse confirms patent attended and stitches were removed; reports "foot doing overall okay, but it is still bothering him;" reports they have scheduled second opinion appointment scheduled for tomorrow with "Dr. Vickki Muff" in [place] ?Reviewed upcoming scheduled provider appointments:  05/10/22- second opinion for podiatry; 05/24/22- PCP; patient's spouse confirms patient is aware of all and has plans to attend as scheduled ?Reminded spouse to complete updated DPR form when they attend upcoming PCP app

## 2022-05-10 ENCOUNTER — Encounter: Payer: Medicare Other | Admitting: Podiatry

## 2022-05-19 DIAGNOSIS — M48062 Spinal stenosis, lumbar region with neurogenic claudication: Secondary | ICD-10-CM | POA: Diagnosis not present

## 2022-05-24 ENCOUNTER — Ambulatory Visit: Payer: Medicare Other | Admitting: Internal Medicine

## 2022-05-26 DIAGNOSIS — Z89421 Acquired absence of other right toe(s): Secondary | ICD-10-CM | POA: Diagnosis not present

## 2022-05-26 DIAGNOSIS — M7741 Metatarsalgia, right foot: Secondary | ICD-10-CM | POA: Diagnosis not present

## 2022-05-26 DIAGNOSIS — M79671 Pain in right foot: Secondary | ICD-10-CM | POA: Diagnosis not present

## 2022-05-27 ENCOUNTER — Ambulatory Visit: Payer: Medicare Other | Admitting: Internal Medicine

## 2022-05-30 ENCOUNTER — Ambulatory Visit (INDEPENDENT_AMBULATORY_CARE_PROVIDER_SITE_OTHER): Payer: Medicare Other

## 2022-05-30 ENCOUNTER — Encounter: Payer: Self-pay | Admitting: Family Medicine

## 2022-05-30 ENCOUNTER — Ambulatory Visit: Payer: Medicare Other | Admitting: Family Medicine

## 2022-05-30 ENCOUNTER — Other Ambulatory Visit: Payer: Self-pay | Admitting: Family Medicine

## 2022-05-30 VITALS — BP 124/62 | HR 60 | Temp 97.8°F | Ht 70.0 in | Wt 183.0 lb

## 2022-05-30 DIAGNOSIS — R509 Fever, unspecified: Secondary | ICD-10-CM

## 2022-05-30 DIAGNOSIS — R5383 Other fatigue: Secondary | ICD-10-CM | POA: Diagnosis not present

## 2022-05-30 DIAGNOSIS — E876 Hypokalemia: Secondary | ICD-10-CM

## 2022-05-30 DIAGNOSIS — R051 Acute cough: Secondary | ICD-10-CM

## 2022-05-30 LAB — CBC WITH DIFFERENTIAL/PLATELET
Basophils Absolute: 0 10*3/uL (ref 0.0–0.1)
Basophils Relative: 0.6 % (ref 0.0–3.0)
Eosinophils Absolute: 0.2 10*3/uL (ref 0.0–0.7)
Eosinophils Relative: 2.9 % (ref 0.0–5.0)
HCT: 37 % — ABNORMAL LOW (ref 39.0–52.0)
Hemoglobin: 12.3 g/dL — ABNORMAL LOW (ref 13.0–17.0)
Lymphocytes Relative: 18 % (ref 12.0–46.0)
Lymphs Abs: 1.2 10*3/uL (ref 0.7–4.0)
MCHC: 33.1 g/dL (ref 30.0–36.0)
MCV: 87.9 fl (ref 78.0–100.0)
Monocytes Absolute: 0.6 10*3/uL (ref 0.1–1.0)
Monocytes Relative: 9.7 % (ref 3.0–12.0)
Neutro Abs: 4.5 10*3/uL (ref 1.4–7.7)
Neutrophils Relative %: 68.8 % (ref 43.0–77.0)
Platelets: 170 10*3/uL (ref 150.0–400.0)
RBC: 4.2 Mil/uL — ABNORMAL LOW (ref 4.22–5.81)
RDW: 15 % (ref 11.5–15.5)
WBC: 6.5 10*3/uL (ref 4.0–10.5)

## 2022-05-30 LAB — POCT URINALYSIS DIPSTICK
Bilirubin, UA: NEGATIVE
Blood, UA: NEGATIVE
Glucose, UA: NEGATIVE
Ketones, UA: NEGATIVE
Leukocytes, UA: NEGATIVE
Nitrite, UA: NEGATIVE
Protein, UA: NEGATIVE
Spec Grav, UA: 1.02
Urobilinogen, UA: 0.2 U/dL
pH, UA: 6

## 2022-05-30 LAB — COMPREHENSIVE METABOLIC PANEL WITH GFR
ALT: 67 U/L — ABNORMAL HIGH (ref 0–53)
AST: 27 U/L (ref 0–37)
Albumin: 3.7 g/dL (ref 3.5–5.2)
Alkaline Phosphatase: 179 U/L — ABNORMAL HIGH (ref 39–117)
BUN: 11 mg/dL (ref 6–23)
CO2: 38 meq/L — ABNORMAL HIGH (ref 19–32)
Calcium: 9.5 mg/dL (ref 8.4–10.5)
Chloride: 96 meq/L (ref 96–112)
Creatinine, Ser: 0.98 mg/dL (ref 0.40–1.50)
GFR: 75.03 mL/min
Glucose, Bld: 116 mg/dL — ABNORMAL HIGH (ref 70–99)
Potassium: 3.2 meq/L — ABNORMAL LOW (ref 3.5–5.1)
Sodium: 141 meq/L (ref 135–145)
Total Bilirubin: 0.5 mg/dL (ref 0.2–1.2)
Total Protein: 6.9 g/dL (ref 6.0–8.3)

## 2022-05-30 LAB — POC COVID19 BINAXNOW: SARS Coronavirus 2 Ag: NEGATIVE

## 2022-05-30 LAB — TSH: TSH: 3.19 u[IU]/mL (ref 0.35–5.50)

## 2022-05-30 MED ORDER — BENZONATATE 200 MG PO CAPS: 200.0000 mg | ORAL_CAPSULE | Freq: Two times a day (BID) | ORAL | 0 refills | Status: DC | PRN

## 2022-05-30 MED ORDER — POTASSIUM CHLORIDE CRYS ER 10 MEQ PO TBCR: 10.0000 meq | EXTENDED_RELEASE_TABLET | Freq: Every day | ORAL | 0 refills | Status: DC

## 2022-05-30 NOTE — Progress Notes (Signed)
His potassium is low so I am going to send in a potassium supplement to his pharmacy. Take this for the next week and then follow up with Dr. Jenny Reichmann please. His liver function tests are slightly elevated as they have been in the past and his anemia is stable.

## 2022-05-30 NOTE — Progress Notes (Signed)
Subjective:  Albert Wade is a 76 y.o. male who presents, with his wife, for a 1 1/2 - 2 week history of URI symptoms.   Reports gradually improving symptoms including fatigue, daytime somnolence, and dry cough.  No fever in the past 5 days. Denies chills, body aches, dizziness, chest pain, palpitations, abdominal pain, N/V/D.   States he had 2 episodes of shortness of breath but none in the past few days. Breathing feels back to baseline.   He has been doing his usual activities such as mowing and work around the house.   States he has chronic sleep issues, wakes up very early and cannot go back to sleep.  He feels depressed and has a psychologist to talk to.    Hx of pneumonia and RSV in 12/2021 Amputation on right big toe in March and it has not been infected. Recent podiatry visit.   Is not using a CPAP. Sleeps on 2 pillows.   Hx of CHF, CAD, MI, HTN, COPD, chronic bronchitis and OSA.    No other aggravating or relieving factors.  No other c/o.  ROS as in subjective.   Past Medical History:  Diagnosis Date   Acute bronchitis 01/01/2015   Acute upper respiratory infection 05/19/2016   Anginal pain (HCC)    Anxiety    Arthritis    back,wrists,hx. 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   Confusion 04/27/2017   COPD (chronic obstructive pulmonary disease) ([place])    Depression    Encounter for preventative adult health care exam with abnormal findings 12/20/2013   Fever 04/27/2017   GERD (gastroesophageal reflux disease)    Gynecomastia 05/12/2015   H/O hiatal hernia    pts wife states pt does not have    Heart murmur    History of kidney stones    HTN  (hypertension)    Hypercholesteremia    Hyperglycemia 05/19/2016   HYPERLIPIDEMIA 04/29/2008   Qualifier: Diagnosis of  By: Jim Like     Impaired glucose tolerance 12/20/2013   Insomnia 04/27/2017   Lumbosacral radiculopathy 10/14/2016   MI (myocardial infarction) [facility])    mid-'90's   MYOCARDIAL INFARCTION 04/29/2008   Qualifier: History of  By: Jim Like     OSA (obstructive sleep apnea) 05/13/2011   Split night study 2011:  AHI 21/hr with obstructive and central events.  Placed on cpap and titrated to 13cm with reasonable control, but attempts to increase pressure increased the number of central events.  Medium resmed quattro full face mask used.     Peripheral neuropathy    peripheral neuropathy   Phimosis/adherent prepuce 10/21/2013   Pneumonia    Prostate cancer ([place])    Shortness of breath 11/05/2010   Qualifier: Diagnosis of  By: Marilynne Halsted, RN, BSN, Jacquelyn     Sleep apnea    mild-no cpap, unable to tolerate   Squamous cell carcinoma of skin 10/29/2020   right mid volar forearm   Umbilical hernia    Umbilical hernia without obstruction and without gangrene 05/19/2016   URI (upper respiratory infection) 04/27/2017   UTI (urinary tract infection) 12/01/2016  Varicose veins    Varicose veins of bilateral lower extremities with other complications 55/37/4827   Wheezing 01/01/2015   Current Outpatient Medications on File Prior to Visit  Medication Sig Dispense Refill   albuterol (VENTOLIN HFA) 108 (90 Base) MCG/ACT inhaler INHALE 2 PUFFS INTO THE LUNGS 4 TIMES DAILY AS NEEDED 8.5 g 2   ALPRAZolam (XANAX) 0.5 MG tablet Take 0.5 mg by mouth 2 (two) times daily as needed for anxiety.     amLODipine (NORVASC) 2.5 MG tablet Take 1 tablet (2.5 mg total) by mouth daily. 90 tablet 3   aspirin 81 MG tablet Take 81 mg by mouth daily.     blood glucose meter kit and supplies KIT Dispense based on patient and insurance preference. Use up to four times daily as directed. 1 each 0    DULoxetine (CYMBALTA) 60 MG capsule Take 60 mg by mouth 2 (two) times daily.      furosemide (LASIX) 40 MG tablet Take 1 tablet (40 mg total) by mouth 2 (two) times daily. 180 tablet 3   omeprazole (PRILOSEC) 20 MG capsule TAKE ONE CAPSULE BY MOUTH TWICE A DAY 180 capsule 3   oxyCODONE-acetaminophen (PERCOCET) 10-325 MG tablet Take 1 tablet by mouth every 4 (four) hours as needed for pain. (Patient taking differently: Take 2 tablets by mouth 4 (four) times daily. Max 8 per day) 30 tablet 0   simvastatin (ZOCOR) 40 MG tablet Take 1 tablet (40 mg total) by mouth at bedtime. 90 tablet 3   traZODone (DESYREL) 50 MG tablet Take 1 tablet (50 mg total) by mouth at bedtime as needed for sleep. (Patient taking differently: Take 150 mg by mouth at bedtime as needed for sleep. Take 2-3 tablets at bedtime as needed) 30 tablet 0   No current facility-administered medications on file prior to visit.     Objective: Vitals:   05/30/22 0936  BP: 124/62  Pulse: 60  Temp: 97.8 F (36.6 C)  SpO2: 99%    General appearance: Alert, WD/WN, no distress, mildly ill appearing                             Skin: warm, no rash                           Head: no sinus tenderness                            Eyes: conjunctiva normal, corneas clear, PERRLA                            Ears: pearly TMs, external ear canals normal                          Nose: septum midline, turbinates swollen, with erythema and clear discharge             Mouth/throat: MMM, tongue normal, mild pharyngeal erythema                           Neck: supple, no adenopathy, no thyromegaly, nontender                          Heart: RRR  Lungs: CTA bilaterally, no wheezes, rales, or rhonchi      Assessment: Fatigue, unspecified type - Plan: POC COVID-19, DG Chest 2 View, CBC with Differential/Platelet, Comprehensive metabolic panel, TSH, TSH, Comprehensive metabolic panel, CBC with Differential/Platelet  Fever,  unspecified fever cause - Plan: POC COVID-19, DG Chest 2 View, CBC with Differential/Platelet, Comprehensive metabolic panel, POCT urinalysis dipstick, Comprehensive metabolic panel, CBC with Differential/Platelet  Acute cough - Plan: DG Chest 2 View, CBC with Differential/Platelet, Comprehensive metabolic panel, benzonatate (TESSALON) 200 MG capsule, Comprehensive metabolic panel, CBC with Differential/Platelet  Hypokalemia - Plan: potassium chloride (KLOR-CON M) 10 MEQ tablet   Plan: Negative Covid test.  Negative urinalysis.   Exam is benign. Vitals WNL Chest XR and labs ordered.  Tessalon prescribed for cough.  Suggested symptomatic OTC remedies. Nasal saline spray for congestion.  Tylenol OTC for fever and malaise.  Call/return if worsening or not improving in the next 3-4 days.  Follow up with PCP for chronic health conditions including insomnia and depression.   Review of labs shows hypokalemia and prescription sent to his pharmacy to replace potassium

## 2022-05-30 NOTE — Patient Instructions (Addendum)
Your Covid test is negative and your urine test is also normal.    Go downstairs before you leave for labs and a chest X ray.   I will be in touch with your results from today.   Rest often and stay hydrated. Your urine should be a light yellow, not dark.   I have sent in a cough medication called Tessalon to your pharmacy.

## 2022-06-06 ENCOUNTER — Ambulatory Visit: Payer: Medicare Other | Admitting: Internal Medicine

## 2022-06-08 ENCOUNTER — Telehealth: Payer: Self-pay

## 2022-06-08 NOTE — Telephone Encounter (Signed)
Pt is calling to get appt rescheduled for the call on 6/19 as they will be out of town.

## 2022-06-13 ENCOUNTER — Telehealth: Payer: Medicare Other

## 2022-06-13 NOTE — Telephone Encounter (Signed)
You're welcome. We are a team that makes the dream work!!

## 2022-06-16 ENCOUNTER — Telehealth: Payer: Self-pay

## 2022-06-16 NOTE — Telephone Encounter (Signed)
   Pre-operative Risk Assessment    Patient Name: Albert Wade  DOB: Jun 22, 1946 [name]: [id number]      Request for Surgical Clearance    Procedure:   LUMBAR LAMINECTOMY AND DECOMPRESSION  Date of Surgery:  Clearance 08/02/22                                 Surgeon:  DR. Charlotte Sanes Surgeon's Group or Practice Name:  [id number]  Phone number:  [phone] Fax number:  [phone]   Type of Clearance Requested:   - Medical  - Pharmacy:  Hold Aspirin NEEDS INSTRUCTIONS    Type of Anesthesia:  General    Additional requests/questions:    SignedJacinta Shoe   06/16/2022, 4:49 PM

## 2022-06-16 NOTE — Telephone Encounter (Signed)
   Name: NATAN HARTOG  DOB: [birth year]  [name]: [id number]  Primary Cardiologist: Dorris Carnes, MD  Chart reviewed as part of pre-operative protocol coverage. Because of Jaskirat Schwieger Mukherjee's past medical history and time since last visit, he will require a follow-up tele visit in order to better assess preoperative cardiovascular risk.  Pre-op covering staff: - Please schedule appointment and call patient to inform them. If patient already had an upcoming appointment within acceptable timeframe, please add "pre-op clearance" to the appointment notes so provider is aware. - Please contact requesting surgeon's office via preferred method (i.e, phone, fax) to inform them of need for appointment prior to surgery.  Currently on only aspirin for history of CAD.  Would recommend continuing aspirin if possible.  Elgie Collard, PA-C  06/16/2022, 5:06 PM

## 2022-06-21 ENCOUNTER — Telehealth: Payer: Self-pay | Admitting: *Deleted

## 2022-06-21 NOTE — Chronic Care Management (AMB) (Signed)
" °  Care Coordination Note  06/21/2022 Name: Albert Wade [name]: [id number] DOB: [birth year]  Albert Wade is a 76 y.o. year old male who is a primary care patient of Norleen Lynwood LELON, MD and is actively engaged with the care management team. I reached out to Vinie LELON Blush by phone today to assist with re-scheduling a follow up visit with the RN Case Manager  Follow up plan: Patient declines further follow up and engagement by the care management team. Appropriate care team members and provider have been notified via electronic communication. The care management team is available to follow up with the patient after provider conversation with the patient regarding recommendation for care management engagement and subsequent re-referral to the care management team.   [facility] Guide, Embedded Care Coordination [facility]  Care Management  Direct Dial: [phone]  "

## 2022-06-22 ENCOUNTER — Ambulatory Visit: Payer: Self-pay | Admitting: *Deleted

## 2022-06-22 ENCOUNTER — Telehealth: Payer: Self-pay | Admitting: *Deleted

## 2022-06-22 ENCOUNTER — Encounter: Payer: Self-pay | Admitting: *Deleted

## 2022-06-22 DIAGNOSIS — I509 Heart failure, unspecified: Secondary | ICD-10-CM

## 2022-06-22 NOTE — Chronic Care Management (AMB) (Signed)
Care Management    RN Visit Note  06/22/2022 Name: Albert Wade [name]: [id number] DOB: 09/17/1946  Subjective: Albert Wade is a 76 y.o. year old male who is a primary care patient of Albert Borg, MD. The care management team was consulted for assistance with disease management and care coordination needs.    Collaboration with care management scheduling care guide  for  clinic RN CM case closure  in response to provider referral for case management and/or care coordination services.   Consent to Services:   Albert Wade was given information about Care Management services 04/05/22 including:  Care Management services includes personalized support from designated clinical staff supervised by his physician, including individualized plan of care and coordination with other care providers 24/7 contact phone numbers for assistance for urgent and routine care needs. The patient may stop case management services at any time by phone call to the office staff.  Patient agreed to services and consent obtained.   Assessment: Review of patient past medical history, allergies, medications, health status, including review of consultants reports, laboratory and other test data, was performed as part of comprehensive evaluation and provision of chronic care management services.  Care Plan  Allergies  Allergen Reactions   Cyclobenzaprine     agitation    Seroquel [Quetiapine] Hives   Outpatient Encounter Medications as of 06/22/2022  Medication Sig   albuterol (VENTOLIN HFA) 108 (90 Base) MCG/ACT inhaler INHALE 2 PUFFS INTO THE LUNGS 4 TIMES DAILY AS NEEDED   ALPRAZolam (XANAX) 0.5 MG tablet Take 0.5 mg by mouth 2 (two) times daily as needed for anxiety.   amLODipine (NORVASC) 2.5 MG tablet Take 1 tablet (2.5 mg total) by mouth daily.   aspirin 81 MG tablet Take 81 mg by mouth daily.   benzonatate (TESSALON) 200 MG capsule Take 1 capsule (200 mg total) by mouth 2 (two) times daily as needed for  cough.   blood glucose meter kit and supplies KIT Dispense based on patient and insurance preference. Use up to four times daily as directed.   DULoxetine (CYMBALTA) 60 MG capsule Take 60 mg by mouth 2 (two) times daily.    furosemide (LASIX) 40 MG tablet Take 1 tablet (40 mg total) by mouth 2 (two) times daily.   omeprazole (PRILOSEC) 20 MG capsule TAKE ONE CAPSULE BY MOUTH TWICE A DAY   oxyCODONE-acetaminophen (PERCOCET) 10-325 MG tablet Take 1 tablet by mouth every 4 (four) hours as needed for pain. (Patient taking differently: Take 2 tablets by mouth 4 (four) times daily. Max 8 per day)   potassium chloride (KLOR-CON M) 10 MEQ tablet Take 1 tablet (10 mEq total) by mouth daily.   simvastatin (ZOCOR) 40 MG tablet Take 1 tablet (40 mg total) by mouth at bedtime.   traZODone (DESYREL) 50 MG tablet Take 1 tablet (50 mg total) by mouth at bedtime as needed for sleep. (Patient taking differently: Take 150 mg by mouth at bedtime as needed for sleep. Take 2-3 tablets at bedtime as needed)   No facility-administered encounter medications on file as of 06/22/2022.   Patient Active Problem List   Diagnosis Date Noted   Cellulitis of right foot 04/01/2022   Acute osteomyelitis of toe of right foot ([place]) 03/31/2022   Overweight (BMI 25.0-29.9) 03/31/2022   Abnormal LFTs 10/14/2021   Chronic diastolic CHF (congestive heart failure) ([place]) 10/14/2021   COPD (chronic obstructive pulmonary disease) (HCC)    CAD (coronary artery disease)    Acute  metabolic encephalopathy    Hypokalemia    HTN (hypertension)    Elevated lactic acid level    Body aches 10/13/2021   GI obstruction ([place]) 02/15/2021   RUQ pain 11/09/2020   Ureteral stricture, right 06/01/2020   Weight loss 10/08/2019   Abdominal pain 10/08/2019   Therapeutic opioid induced constipation 10/08/2019   Fever 04/27/2017   URI (upper respiratory infection) 04/27/2017   Slowing, urinary stream 04/27/2017   Confusion 04/27/2017   Insomnia  04/27/2017   Prostate cancer ([place]) 01/27/2017   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/2016   Lumbosacral radiculopathy 10/14/2016   Varicose veins of bilateral lower extremities with other complications 57/32/2025   Umbilical hernia without obstruction and without gangrene 05/19/2016   Hyperglycemia 05/19/2016   Acute upper respiratory infection 05/19/2016   Gynecomastia 05/12/2015   Encounter for preventative adult health care exam with abnormal findings 12/20/2013   GERD (gastroesophageal reflux disease)    Anxiety    Chronic back pain    Depression with anxiety    Benign prostatic hyperplasia    Peripheral neuropathy    Phimosis/adherent prepuce 10/21/2013   OSA (obstructive sleep apnea) 05/13/2011   Coronary atherosclerosis 10/16/2009   HLD (hyperlipidemia) 04/29/2008   MYOCARDIAL INFARCTION 04/29/2008   BRONCHITIS, CHRONIC 04/29/2008   Conditions to be addressed/monitored: CHF/ recent surgical amputation of (R) great toe  Care Plan : RN Care Manager Plan of Care  Updates made by Knox Royalty, RN since 06/22/2022 12:00 AM     Problem: Chronic Disease Management Needs   Priority: High     Long-Range Goal: Ongoing adherence to established plan of care for long term chronic disease management   Start Date: 04/14/2022  Expected End Date: 04/15/2023  Priority: High  Note:   Current Barriers:  Chronic Disease Management support and education needs related to CHF and CAD Recent hospitalization April 6-8, 2023 for (R) great toe amputation due to osteomyelitis; discharged home to self-care without home health services Chronic pain- followed by established pain management provider in [place]: attends routine visits with provider every 3 months  RNCM Clinical Goal(s):  Patient will demonstrate ongoing health management independence as evidenced by adherence to plan of care for self-health management of CHF/ CAD        through collaboration with RN Care manager,  provider, and care team  Interventions: 1:1 collaboration with primary care provider regarding development and update of comprehensive plan of care as evidenced by provider attestation and co-signature Inter-disciplinary care team collaboration (see longitudinal plan of care) 04/14/22: RN CM initial assessment completed Evaluation of current treatment plan related to  self management and patient's adherence to plan as established by provider Review of patient status, including review of consultants reports, relevant laboratory and other test results, and medications completed 06/22/22: received care coordination outreach from care management scheduling care guide that patient has declined ongoing need for clinic RN CM outreach and wishes to discontinue ongoing participation in care management services thorough PCP office; clinic RN CM case closure today accordingly       Plan:  No further follow up required: patient has declined ongoing follow up/ engagement in care management services through PCP office; clinic RN CM case closure accordingly today  Oneta Rack, RN, BSN, [facility] RN Care Coordination- [place] [phone]: direct office

## 2022-06-22 NOTE — Telephone Encounter (Signed)
Pt agreeable to tele pre op appt 07/13/22 @ 9 am. Med rec and consent are done.     Patient Consent for Virtual Visit        Albert Wade has provided verbal consent on 06/22/2022 for a virtual visit (video or telephone).   CONSENT FOR VIRTUAL VISIT FOR:  Albert Wade  By participating in this virtual visit I agree to the following:  I hereby voluntarily request, consent and authorize [facility] and its employed or contracted physicians, physician assistants, nurse practitioners or other licensed health care professionals (the Practitioner), to provide me with telemedicine health care services (the "Services") as deemed necessary by the treating Practitioner. I acknowledge and consent to receive the Services by the Practitioner via telemedicine. I understand that the telemedicine visit will involve communicating with the Practitioner through live audiovisual communication technology and the disclosure of certain medical information by electronic transmission. I acknowledge that I have been given the opportunity to request an in-person assessment or other available alternative prior to the telemedicine visit and am voluntarily participating in the telemedicine visit.  I understand that I have the right to withhold or withdraw my consent to the use of telemedicine in the course of my care at any time, without affecting my right to future care or treatment, and that the Practitioner or I may terminate the telemedicine visit at any time. I understand that I have the right to inspect all information obtained and/or recorded in the course of the telemedicine visit and may receive copies of available information for a reasonable fee.  I understand that some of the potential risks of receiving the Services via telemedicine include:  Delay or interruption in medical evaluation due to technological equipment failure or disruption; Information transmitted may not be sufficient (e.g. poor resolution  of images) to allow for appropriate medical decision making by the Practitioner; and/or  In rare instances, security protocols could fail, causing a breach of personal health information.  Furthermore, I acknowledge that it is my responsibility to provide information about my medical history, conditions and care that is complete and accurate to the best of my ability. I acknowledge that Practitioner's advice, recommendations, and/or decision may be based on factors not within their control, such as incomplete or inaccurate data provided by me or distortions of diagnostic images or specimens that may result from electronic transmissions. I understand that the practice of medicine is not an exact science and that Practitioner makes no warranties or guarantees regarding treatment outcomes. I acknowledge that a copy of this consent can be made available to me via my patient portal ([facility]), or I can request a printed copy by calling the office of [facility].    I understand that my insurance will be billed for this visit.   I have read or had this consent read to me. I understand the contents of this consent, which adequately explains the benefits and risks of the Services being provided via telemedicine.  I have been provided ample opportunity to ask questions regarding this consent and the Services and have had my questions answered to my satisfaction. I give my informed consent for the services to be provided through the use of telemedicine in my medical care

## 2022-06-22 NOTE — Telephone Encounter (Signed)
Pt agreeable to tele pre op appt 07/13/22 @ 9 am. Med rec and consent are done.

## 2022-07-04 DIAGNOSIS — M48061 Spinal stenosis, lumbar region without neurogenic claudication: Secondary | ICD-10-CM | POA: Diagnosis not present

## 2022-07-04 DIAGNOSIS — M542 Cervicalgia: Secondary | ICD-10-CM | POA: Diagnosis not present

## 2022-07-04 DIAGNOSIS — Z79899 Other long term (current) drug therapy: Secondary | ICD-10-CM | POA: Diagnosis not present

## 2022-07-04 DIAGNOSIS — M47812 Spondylosis without myelopathy or radiculopathy, cervical region: Secondary | ICD-10-CM | POA: Diagnosis not present

## 2022-07-04 DIAGNOSIS — G894 Chronic pain syndrome: Secondary | ICD-10-CM | POA: Diagnosis not present

## 2022-07-04 DIAGNOSIS — K5909 Other constipation: Secondary | ICD-10-CM | POA: Diagnosis not present

## 2022-07-04 DIAGNOSIS — M25571 Pain in right ankle and joints of right foot: Secondary | ICD-10-CM | POA: Diagnosis not present

## 2022-07-13 ENCOUNTER — Ambulatory Visit (INDEPENDENT_AMBULATORY_CARE_PROVIDER_SITE_OTHER): Payer: Medicare Other | Admitting: Nurse Practitioner

## 2022-07-13 DIAGNOSIS — Z01810 Encounter for preprocedural cardiovascular examination: Secondary | ICD-10-CM

## 2022-07-13 NOTE — Progress Notes (Signed)
Virtual Visit via Telephone Note   Because of Albert Wade's co-morbid illnesses, he is at least at moderate risk for complications without adequate follow up.  This format is felt to be most appropriate for this patient at this time.  The patient did not have access to video technology/had technical difficulties with video requiring transitioning to audio format only (telephone).  All issues noted in this document were discussed and addressed.  No physical exam could be performed with this format.  Please refer to the patient's chart for his consent to telehealth for Albert Wade.  Evaluation Performed:  Preoperative cardiovascular risk assessment _____________   Date:  07/13/2022   Patient ID:  Albert Wade, DOB 1946-06-13, [name] [id number] Patient Location:  Home Provider location:   Office  Primary Care Provider:  Biagio Borg, Albert Wade Primary Cardiologist:  Albert Carnes, Albert Wade  Chief Complaint / Patient Profile   76 y.o. y/o male with a h/o CAD by cath in 2011, hypertension, hyperlipidemia, and COPD who is pending laminectomy and decompression on 08/02/2022 with Dr. Charlotte Wade with Albert Wade and presents today for telephonic preoperative cardiovascular risk assessment.  Past Medical History    Past Medical History:  Diagnosis Date   Acute bronchitis 01/01/2015   Acute upper respiratory infection 05/19/2016   Anginal pain (HCC)    Anxiety    Arthritis    back,wrists,hx. previous fractures   BPH (benign prostatic hypertrophy)    BRONCHITIS, CHRONIC 04/29/2008   Qualifier: Diagnosis of  By: Albert Chessman Albert Wade, Vineet     CAD 10/16/2009   Qualifier: Diagnosis of  By: Albert Wade     CAD (coronary artery disease)    angioplasty    Chronic back pain    Chronic bronchitis    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/2016   Confusion 04/27/2017   COPD (chronic obstructive pulmonary disease) ([place])    Depression    Encounter for preventative adult health care exam with abnormal  findings 12/20/2013   Fever 04/27/2017   GERD (gastroesophageal reflux disease)    Gynecomastia 05/12/2015   H/O hiatal hernia    pts wife states pt does not have    Heart murmur    History of kidney stones    HTN (hypertension)    Hypercholesteremia    Hyperglycemia 05/19/2016   HYPERLIPIDEMIA 04/29/2008   Qualifier: Diagnosis of  By: Albert Wade     Impaired glucose tolerance 12/20/2013   Insomnia 04/27/2017   Lumbosacral radiculopathy 10/14/2016   MI (myocardial infarction) Albert Wade)    mid-'90's   MYOCARDIAL INFARCTION 04/29/2008   Qualifier: History of  By: Albert Wade     OSA (obstructive sleep apnea) 05/13/2011   Split night study 2011:  AHI 21/hr with obstructive and central events.  Placed on cpap and titrated to 13cm with reasonable control, but attempts to increase pressure increased the number of central events.  Medium resmed quattro full face mask used.     Peripheral neuropathy    peripheral neuropathy   Phimosis/adherent prepuce 10/21/2013   Pneumonia    Prostate cancer ([place])    Shortness of breath 11/05/2010   Qualifier: Diagnosis of  By: Albert Wade, Jacquelyn     Sleep apnea    mild-no cpap, unable to tolerate   Squamous cell carcinoma of skin 10/29/2020   right mid volar forearm   Umbilical hernia    Umbilical hernia without obstruction and without gangrene 05/19/2016   URI (upper respiratory infection) 04/27/2017  UTI (urinary tract infection) 12/01/2016   Varicose veins    Varicose veins of bilateral lower extremities with other complications 54/56/2563   Wheezing 01/01/2015   Past Surgical History:  Procedure Laterality Date   AMPUTATION TOE Right 04/01/2022   Procedure: AMPUTATION TOE-Right Great Toe Amputation;  Surgeon: Albert Wade;  Location: [facility] ORS;  Service: Podiatry;  Laterality: Right;   ANGIOPLASTY     APPENDECTOMY     ARTERY BIOPSY Right 10/15/2021   Procedure: BIOPSY TEMPORAL ARTERY;  Surgeon: Albert Sprague, DO;  Location:  [facility]S;  Service: General;  Laterality: Right;   BACK SURGERY     x3   CARDIAC CATHETERIZATION     11'11   CIRCUMCISION N/A 10/21/2013   Procedure: CIRCUMCISION ADULT;  Surgeon: Albert Amass, Albert Wade;  Location: [facility];  Service: Urology;  Laterality: N/A;   COLONOSCOPY WITH PROPOFOL N/A 06/09/2021   Procedure: COLONOSCOPY WITH PROPOFOL;  Surgeon: Albert Bellows, Albert Wade;  Location: [facility] ENDOSCOPY;  Service: Gastroenterology;  Laterality: N/A;   CYSTOSCOPY N/A 10/21/2013   Procedure: CYSTOSCOPY FLEXIBLE;  Surgeon: Albert Amass, Albert Wade;  Location: [facility];  Service: Urology;  Laterality: N/A;   CYSTOSCOPY W/ URETERAL STENT PLACEMENT Right 06/01/2020   Procedure: CYSTOSCOPY WITH RETROGRADE PYELOGRAM/URETERAL STENT Exchange;  Surgeon: Albert Espy, Albert Wade;  Location: [facility];  Service: Urology;  Laterality: Right;   CYSTOSCOPY WITH BIOPSY Right 01/06/2020   Procedure: CYSTOSCOPY WITH BIOPSY;  Surgeon: Albert Espy, Albert Wade;  Location: [facility];  Service: Urology;  Laterality: Right;   CYSTOSCOPY/URETEROSCOPY/HOLMIUM LASER/STENT PLACEMENT Right 01/06/2020   Procedure: CYSTOSCOPY/URETEROSCOPY/HOLMIUM LASER/STENT PLACEMENT;  Surgeon: Albert Espy, Albert Wade;  Location: [facility]S;  Service: Urology;  Laterality: Right;   ESOPHAGOGASTRODUODENOSCOPY N/A 02/03/2021   Procedure: ESOPHAGOGASTRODUODENOSCOPY (EGD);  Surgeon: Albert Bellows, Albert Wade;  [facility];  Service: Gastroenterology;  Laterality: N/A;   ESOPHAGOGASTRODUODENOSCOPY (EGD) WITH PROPOFOL N/A 03/09/2021   Procedure: ESOPHAGOGASTRODUODENOSCOPY (EGD) WITH PROPOFOL;  Surgeon: Albert Bellows, Albert Wade;  Location: [facility] ENDOSCOPY;  Service: Gastroenterology;  Laterality: N/A;   HERNIA REPAIR     INSERTION OF MESH N/A 06/17/2016   Procedure: INSERTION OF MESH;  Surgeon: Albert Seltzer, Albert Wade;  Location: [facility];  Service: General;  Laterality: N/A;   UMBILICAL HERNIA REPAIR N/A 06/17/2016   Procedure: REPAIR UMBILICAL HERNIA WITH MESH;  Surgeon: Albert Seltzer, Albert Wade;   Location: [facility];  Service: General;  Laterality: N/A;   WRIST SURGERY Right     Allergies  Allergies  Allergen Reactions   Cyclobenzaprine     agitation    Seroquel [Quetiapine] Hives    History of Present Illness    KAYON DOZIER is a 76 y.o. male who presents via audio/video conferencing for a telehealth visit today.  Pt was last seen in cardiology clinic on 09/16/2021 by Dr. Harrington Challenger.  At that time DURIEL DEERY was doing well.  The patient is now pending procedure as outlined above. Since his last visit, he has done well from a cardiac standpoint.  He does report significant back pain and is eager to proceed with surgery. He denies chest pain, palpitations, dyspnea, pnd, orthopnea, n, v, dizziness, syncope, edema, weight gain, or early satiety. All other systems reviewed and are otherwise negative except as noted above.   Home Medications    Prior to Admission medications   Medication Sig Start Date End Date Taking? Authorizing Provider  albuterol (VENTOLIN HFA) 108 (90 Base) MCG/ACT inhaler INHALE 2 PUFFS INTO THE LUNGS 4 TIMES DAILY AS NEEDED 01/08/22  Albert Borg, Albert Wade  ALPRAZolam Duanne Moron) 0.5 MG tablet Take 0.5 mg by mouth 2 (two) times daily as needed for anxiety. 01/28/21   Provider, Historical, Albert Wade  amLODipine (NORVASC) 2.5 MG tablet Take 1 tablet (2.5 mg total) by mouth daily. 09/16/21   Fay Records, Albert Wade  aspirin 81 MG tablet Take 81 mg by mouth daily.    Provider, Historical, Albert Wade  benzonatate (TESSALON) 200 MG capsule Take 1 capsule (200 mg total) by mouth 2 (two) times daily as needed for cough. 05/30/22   Henson, Vickie L, NP-C  blood glucose meter kit and supplies KIT Dispense based on patient and insurance preference. Use up to four times daily as directed. 10/16/21   Ezekiel Slocumb, DO  DULoxetine (CYMBALTA) 60 MG capsule Take 60 mg by mouth 2 (two) times daily.  04/23/20   Provider, Historical, Albert Wade  furosemide (LASIX) 40 MG tablet Take 1 tablet (40 mg total) by mouth 2  (two) times daily. 07/21/21   Albert Borg, Albert Wade  omeprazole (PRILOSEC) 20 MG capsule TAKE ONE CAPSULE BY MOUTH TWICE A DAY 04/25/22   Albert Bellows, Albert Wade  oxyCODONE-acetaminophen (PERCOCET) 10-325 MG tablet Take 1 tablet by mouth every 4 (four) hours as needed for pain. Patient taking differently: Take 2 tablets by mouth 4 (four) times daily. Max 8 per day 01/06/20   Albert Espy, Albert Wade  potassium chloride (KLOR-CON M) 10 MEQ tablet Take 1 tablet (10 mEq total) by mouth daily. 05/30/22   Henson, Vickie L, NP-C  simvastatin (ZOCOR) 40 MG tablet Take 1 tablet (40 mg total) by mouth at bedtime. 09/20/21   Fay Records, Albert Wade  traZODone (DESYREL) 50 MG tablet Take 1 tablet (50 mg total) by mouth at bedtime as needed for sleep. Patient taking differently: Take 150 mg by mouth at bedtime as needed for sleep. Take 2-3 tablets at bedtime as needed 10/16/21   Ezekiel Slocumb, DO    Physical Exam    Vital Signs:  ANTOINE VANDERMEULEN does not have vital signs available for review today.  Given telephonic nature of communication, physical exam is limited. AAOx3. NAD. Normal affect.  Speech and respirations are unlabored.  Accessory Clinical Findings    None  Assessment & Plan    1.  Preoperative Cardiovascular Risk Assessment:  According to the Revised Cardiac Risk Index (RCRI), his Perioperative Risk of Major Cardiac Event is (%): 0.4. His Functional Capacity in METs is: 6.36 according to the [place] Activity Status Index (DASI).Therefore, based on ACC/AHA guidelines, patient would be at acceptable risk for the planned procedure without further cardiovascular testing.   Patient takes aspirin 81 mg daily for history of mild CAD.  Ideally, he will continue aspirin throughout the perioperative period. However, if aspirin must be held prior to surgery, he may hold aspirin for 7 days prior to procedure. Please resume aspirin as soon as possible postprocedure, at the discretion of the surgeon.  A copy of this note will  be routed to requesting surgeon.  Time:   Today, I have spent 8 minutes with the patient with telehealth technology discussing medical history, symptoms, and management plan.     Lenna Sciara, NP  07/13/2022, 9:13 AM

## 2022-07-21 DIAGNOSIS — M48061 Spinal stenosis, lumbar region without neurogenic claudication: Secondary | ICD-10-CM | POA: Diagnosis not present

## 2022-08-02 ENCOUNTER — Other Ambulatory Visit: Payer: Self-pay | Admitting: Family Medicine

## 2022-08-02 ENCOUNTER — Telehealth: Payer: Self-pay

## 2022-08-02 DIAGNOSIS — Z9049 Acquired absence of other specified parts of digestive tract: Secondary | ICD-10-CM | POA: Diagnosis not present

## 2022-08-02 DIAGNOSIS — Z79891 Long term (current) use of opiate analgesic: Secondary | ICD-10-CM | POA: Diagnosis not present

## 2022-08-02 DIAGNOSIS — Z888 Allergy status to other drugs, medicaments and biological substances status: Secondary | ICD-10-CM | POA: Diagnosis not present

## 2022-08-02 DIAGNOSIS — G4733 Obstructive sleep apnea (adult) (pediatric): Secondary | ICD-10-CM | POA: Diagnosis not present

## 2022-08-02 DIAGNOSIS — E785 Hyperlipidemia, unspecified: Secondary | ICD-10-CM | POA: Diagnosis not present

## 2022-08-02 DIAGNOSIS — Z79899 Other long term (current) drug therapy: Secondary | ICD-10-CM | POA: Diagnosis not present

## 2022-08-02 DIAGNOSIS — M47816 Spondylosis without myelopathy or radiculopathy, lumbar region: Secondary | ICD-10-CM | POA: Diagnosis not present

## 2022-08-02 DIAGNOSIS — M48062 Spinal stenosis, lumbar region with neurogenic claudication: Secondary | ICD-10-CM | POA: Diagnosis not present

## 2022-08-02 DIAGNOSIS — Z955 Presence of coronary angioplasty implant and graft: Secondary | ICD-10-CM | POA: Diagnosis not present

## 2022-08-02 DIAGNOSIS — E876 Hypokalemia: Secondary | ICD-10-CM | POA: Diagnosis not present

## 2022-08-02 DIAGNOSIS — G9741 Accidental puncture or laceration of dura during a procedure: Secondary | ICD-10-CM | POA: Diagnosis not present

## 2022-08-02 DIAGNOSIS — F32A Depression, unspecified: Secondary | ICD-10-CM | POA: Diagnosis not present

## 2022-08-02 DIAGNOSIS — R0781 Pleurodynia: Secondary | ICD-10-CM | POA: Diagnosis not present

## 2022-08-02 DIAGNOSIS — Z8249 Family history of ischemic heart disease and other diseases of the circulatory system: Secondary | ICD-10-CM | POA: Diagnosis not present

## 2022-08-02 DIAGNOSIS — Z9889 Other specified postprocedural states: Secondary | ICD-10-CM | POA: Diagnosis not present

## 2022-08-02 DIAGNOSIS — M1990 Unspecified osteoarthritis, unspecified site: Secondary | ICD-10-CM | POA: Diagnosis not present

## 2022-08-02 DIAGNOSIS — Z7982 Long term (current) use of aspirin: Secondary | ICD-10-CM | POA: Diagnosis not present

## 2022-08-02 DIAGNOSIS — K219 Gastro-esophageal reflux disease without esophagitis: Secondary | ICD-10-CM | POA: Diagnosis not present

## 2022-08-02 DIAGNOSIS — I252 Old myocardial infarction: Secondary | ICD-10-CM | POA: Diagnosis not present

## 2022-08-02 DIAGNOSIS — I2510 Atherosclerotic heart disease of native coronary artery without angina pectoris: Secondary | ICD-10-CM | POA: Diagnosis not present

## 2022-08-02 DIAGNOSIS — I10 Essential (primary) hypertension: Secondary | ICD-10-CM | POA: Diagnosis not present

## 2022-08-02 MED ORDER — POTASSIUM CHLORIDE CRYS ER 10 MEQ PO TBCR: 10.0000 meq | EXTENDED_RELEASE_TABLET | Freq: Every day | ORAL | 0 refills | Status: DC

## 2022-08-02 NOTE — Telephone Encounter (Signed)
Pts wife has called and stated the pt is in [place] for back surgery and they checked his blood work before surgery in which it came back that his potassium is at 3.0 and is needing a rx for potassium ASAP.  **The surgical office did give the pt potassium through IV to complete surgery. However, he is needing a rx ASAP.  **Pt is on 80 mg of LASIX a day. Wife is needing to know if the pt needs to cut back on taking the LASIX at this dose and go with a lower dose? Pt will be coming home tomorrow 08/03/2022.  Pts Wife Albert Wade can be reached at [phone] with the update on the potassium and instructions on how to take the Lasix.

## 2022-08-02 NOTE — Telephone Encounter (Signed)
Called pt, talked to wife Albert Wade in regards to her questions about her wanting med sent if for the husband and to take the lasix as prescribed based on Dr. Jenny Reichmann order. Wife understood and will be picking up prescription tomorrow

## 2022-08-02 NOTE — Telephone Encounter (Signed)
Ok to take the lasix as rx, but also K script at 10 meq per day - I will send now

## 2022-08-23 ENCOUNTER — Emergency Department
Admission: EM | Admit: 2022-08-23 | Discharge: 2022-08-23 | Disposition: A | Discharge status: Home / Self Care | Payer: Medicare Other | Attending: Emergency Medicine | Admitting: Emergency Medicine

## 2022-08-23 ENCOUNTER — Emergency Department: Payer: Medicare Other

## 2022-08-23 ENCOUNTER — Other Ambulatory Visit: Payer: Self-pay

## 2022-08-23 DIAGNOSIS — R509 Fever, unspecified: Secondary | ICD-10-CM | POA: Diagnosis not present

## 2022-08-23 DIAGNOSIS — S29001A Unspecified injury of muscle and tendon of front wall of thorax, initial encounter: Secondary | ICD-10-CM | POA: Diagnosis present

## 2022-08-23 DIAGNOSIS — I2510 Atherosclerotic heart disease of native coronary artery without angina pectoris: Secondary | ICD-10-CM | POA: Diagnosis not present

## 2022-08-23 DIAGNOSIS — J449 Chronic obstructive pulmonary disease, unspecified: Secondary | ICD-10-CM | POA: Diagnosis not present

## 2022-08-23 DIAGNOSIS — S2242XA Multiple fractures of ribs, left side, initial encounter for closed fracture: Secondary | ICD-10-CM | POA: Diagnosis not present

## 2022-08-23 DIAGNOSIS — X58XXXA Exposure to other specified factors, initial encounter: Secondary | ICD-10-CM | POA: Diagnosis not present

## 2022-08-23 DIAGNOSIS — I10 Essential (primary) hypertension: Secondary | ICD-10-CM | POA: Diagnosis not present

## 2022-08-23 DIAGNOSIS — Z20822 Contact with and (suspected) exposure to covid-19: Secondary | ICD-10-CM | POA: Insufficient documentation

## 2022-08-23 DIAGNOSIS — Z87442 Personal history of urinary calculi: Secondary | ICD-10-CM | POA: Diagnosis not present

## 2022-08-23 LAB — CBC
HCT: 35 % — ABNORMAL LOW (ref 39.0–52.0)
Hemoglobin: 11.1 g/dL — ABNORMAL LOW (ref 13.0–17.0)
MCH: 28.2 pg (ref 26.0–34.0)
MCHC: 31.7 g/dL (ref 30.0–36.0)
MCV: 88.8 fL (ref 80.0–100.0)
Platelets: 207 10*3/uL (ref 150–400)
RBC: 3.94 MIL/uL — ABNORMAL LOW (ref 4.22–5.81)
RDW: 12.3 % (ref 11.5–15.5)
WBC: 7.5 10*3/uL (ref 4.0–10.5)
nRBC: 0 % (ref 0.0–0.2)

## 2022-08-23 LAB — URINALYSIS, ROUTINE W REFLEX MICROSCOPIC
Bacteria, UA: NONE SEEN
Bilirubin Urine: NEGATIVE
Glucose, UA: NEGATIVE mg/dL
Ketones, ur: NEGATIVE mg/dL
Leukocytes,Ua: NEGATIVE
Nitrite: NEGATIVE
Protein, ur: NEGATIVE mg/dL
Specific Gravity, Urine: 1.018 (ref 1.005–1.030)
pH: 6 (ref 5.0–8.0)

## 2022-08-23 LAB — BASIC METABOLIC PANEL WITH GFR
Anion gap: 5 (ref 5–15)
BUN: 15 mg/dL (ref 8–23)
CO2: 26 mmol/L (ref 22–32)
Calcium: 8.9 mg/dL (ref 8.9–10.3)
Chloride: 107 mmol/L (ref 98–111)
Creatinine, Ser: 1.09 mg/dL (ref 0.61–1.24)
GFR, Estimated: >60 mL/min
Glucose, Bld: 153 mg/dL — ABNORMAL HIGH (ref 70–99)
Potassium: 4.1 mmol/L (ref 3.5–5.1)
Sodium: 138 mmol/L (ref 135–145)

## 2022-08-23 LAB — SARS CORONAVIRUS 2 BY RT PCR: SARS Coronavirus 2 by RT PCR: NEGATIVE

## 2022-08-23 MED ORDER — LACTATED RINGERS IV BOLUS
1000.0000 mL | Freq: Once | INTRAVENOUS | Status: AC
  Administered 2022-08-23: 1000 mL via INTRAVENOUS

## 2022-08-23 NOTE — ED Provider Notes (Signed)
[facility] Provider Note    Event Date/Time   First MD Initiated Contact with Patient 08/23/22 [phone]     (approximate)   History   Fever   HPI  Albert Wade is a 76 y.o. male with past medical history of coronary disease, hypertension hyperlipidemia and COPD who presents with fever.  Patient started feeling unwell yesterday.  He feels generally just fatigued and had fever of 101.9 last night.  No measured fever today.  Denies cough runny nose shortness of breath.  Denies nausea vomiting diarrhea or other GI symptoms.  No abdominal pain.  Denies rashes.  Did have burning with urination today.  He had a laminectomy and decompression on 8/8 with [facility].  Since surgery has had left anterior rib pain.  Feels worse when he takes a deep breath.  He was prescribed a Lidoderm patch for this.  He thinks his ribs are broken denies any trauma.  Patient denies any worsening back pain drainage or redness around his incision.  No sick contacts.  Still eating and drinking.     Past Medical History:  Diagnosis Date   Acute bronchitis 01/01/2015   Acute upper respiratory infection 05/19/2016   Anginal pain (HCC)    Anxiety    Arthritis    back,wrists,hx. 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   Confusion 04/27/2017   COPD (chronic obstructive pulmonary disease) ([place])    Depression    Encounter for preventative adult health care exam with abnormal findings 12/20/2013   Fever 04/27/2017   GERD (gastroesophageal reflux disease)    Gynecomastia 05/12/2015   H/O hiatal hernia    pts wife states pt does not have    Heart murmur    History of kidney stones    HTN  (hypertension)    Hypercholesteremia    Hyperglycemia 05/19/2016   HYPERLIPIDEMIA 04/29/2008   Qualifier: Diagnosis of  By: Jim Like     Impaired glucose tolerance 12/20/2013   Insomnia 04/27/2017   Lumbosacral radiculopathy 10/14/2016   MI (myocardial infarction) [facility])    mid-'90's   MYOCARDIAL INFARCTION 04/29/2008   Qualifier: History of  By: Jim Like     OSA (obstructive sleep apnea) 05/13/2011   Split night study 2011:  AHI 21/hr with obstructive and central events.  Placed on cpap and titrated to 13cm with reasonable control, but attempts to increase pressure increased the number of central events.  Medium resmed quattro full face mask used.     Peripheral neuropathy    peripheral neuropathy   Phimosis/adherent prepuce 10/21/2013   Pneumonia    Prostate cancer ([place])    Shortness of breath 11/05/2010   Qualifier: Diagnosis of  By: Marilynne Halsted, RN, BSN, Jacquelyn     Sleep apnea    mild-no cpap, unable to tolerate   Squamous cell carcinoma of skin 10/29/2020   right mid volar forearm   Umbilical hernia    Umbilical hernia without obstruction and without gangrene 05/19/2016   URI (upper respiratory infection) 04/27/2017   UTI (urinary tract infection) 12/01/2016   Varicose veins    Varicose veins of  bilateral lower extremities with other complications 24/40/1027   Wheezing 01/01/2015    Patient Active Problem List   Diagnosis Date Noted   Cellulitis of right foot 04/01/2022   Acute osteomyelitis of toe of right foot ([place]) 03/31/2022   Overweight (BMI 25.0-29.9) 03/31/2022   Abnormal LFTs 10/14/2021   Chronic diastolic CHF (congestive heart failure) ([employer]) 10/14/2021   COPD (chronic obstructive pulmonary disease) (HCC)    CAD (coronary artery disease)    Acute metabolic encephalopathy    Hypokalemia    HTN (hypertension)    Elevated lactic acid level    Body aches 10/13/2021   GI obstruction ([place]) 02/15/2021   RUQ pain 11/09/2020   Ureteral stricture, right  06/01/2020   Weight loss 10/08/2019   Abdominal pain 10/08/2019   Therapeutic opioid induced constipation 10/08/2019   Fever 04/27/2017   URI (upper respiratory infection) 04/27/2017   Slowing, urinary stream 04/27/2017   Confusion 04/27/2017   Insomnia 04/27/2017   Prostate cancer ([place]) 01/27/2017   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/2016   Lumbosacral radiculopathy 10/14/2016   Varicose veins of bilateral lower extremities with other complications 25/36/6440   Umbilical hernia without obstruction and without gangrene 05/19/2016   Hyperglycemia 05/19/2016   Acute upper respiratory infection 05/19/2016   Gynecomastia 05/12/2015   Encounter for preventative adult health care exam with abnormal findings 12/20/2013   GERD (gastroesophageal reflux disease)    Anxiety    Chronic back pain    Depression with anxiety    Benign prostatic hyperplasia    Peripheral neuropathy    Phimosis/adherent prepuce 10/21/2013   OSA (obstructive sleep apnea) 05/13/2011   Coronary atherosclerosis 10/16/2009   HLD (hyperlipidemia) 04/29/2008   MYOCARDIAL INFARCTION 04/29/2008   BRONCHITIS, CHRONIC 04/29/2008     Physical Exam  Triage Vital Signs: ED Triage Vitals  Enc Vitals Group     BP 08/23/22 0903 (!) 140/96     Pulse Rate 08/23/22 0903 81     Resp 08/23/22 0903 18     Temp 08/23/22 0903 98.1 F (36.7 C)     Temp src --      SpO2 08/23/22 0903 91 %     Weight --      Height --      Head Circumference --      Peak Flow --      Pain Score 08/23/22 0900 9     Pain Loc --      Pain Edu? --      Excl. in [place]? --     Most recent vital signs: Vitals:   08/23/22 0903 08/23/22 1156  BP: (!) 140/96 131/64  Pulse: 81 69  Resp: 18 16  Temp: 98.1 F (36.7 C) 97.8 F (36.6 C)  SpO2: 91% 100%     General: Awake, no distress.  CV:  Good peripheral perfusion.  No peripheral edema Resp:  Normal effort.  Lungs are clear no increased work of breathing Abd:  No distention.  Abdomen  soft nontender Neuro:             Awake, Alert, Oriented x 3  Other:  Tenderness to palpation over the left costophrenic angle   ED Results / Procedures / Treatments  Labs (all labs ordered are listed, but only abnormal results are displayed) Labs Reviewed  CBC - Abnormal; Notable for the following components:      Result Value   RBC 3.94 (*)    Hemoglobin 11.1 (*)    HCT  35.0 (*)    All other components within normal limits  BASIC METABOLIC PANEL - Abnormal; Notable for the following components:   Glucose, Bld 153 (*)    All other components within normal limits  URINALYSIS, ROUTINE W REFLEX MICROSCOPIC - Abnormal; Notable for the following components:   Color, Urine AMBER (*)    APPearance CLEAR (*)    Hgb urine dipstick SMALL (*)    All other components within normal limits  SARS CORONAVIRUS 2 BY RT PCR     EKG  EKG interpretation performed by myself: NSR, nml axis, nml intervals, no acute ischemic changes    RADIOLOGY Chest x-ray interpreted myself is negative for pneumothorax or infiltrate   PROCEDURES:  Critical Care performed: No  .1-3 Lead EKG Interpretation  Performed by: Rada Hay, MD Authorized by: Rada Hay, MD     Interpretation: normal     ECG rate assessment: normal     Rhythm: sinus rhythm     Ectopy: none     Conduction: normal     The patient is on the cardiac monitor to evaluate for evidence of arrhythmia and/or significant heart rate changes.   MEDICATIONS ORDERED IN ED: Medications  lactated ringers bolus 1,000 mL (1,000 mLs Intravenous New Bag/Given 08/23/22 1105)     IMPRESSION / MDM / ASSESSMENT AND PLAN / ED COURSE  I reviewed the triage vital signs and the nursing notes.                              Patient's presentation is most consistent with acute presentation with potential threat to life or bodily function.  Differential diagnosis includes, but is not limited to, pneumonia, viral illness, UTI,  bacteremia, postoperative spinal infection  The patient is a 76 year old male presents with fever generalized weakness and fatigue since yesterday.  Did have a laminectomy about 3 weeks ago has had left anterior rib pain since that time with pleuritic pain in that region but denies shortness of breath cough.  Had fever 101.9 yesterday denies GI symptoms.  Patient is afebrile here initial sat borderline at 91% but he is satting well on my evaluation in the room.  His lungs sound clear his abdomen is benign he has no findings of skin or soft tissue infection.  His incision on the back looks to be well-healed is not cellulitic there is no drainage.  Labs including a CBC and BMP are reassuring he has no leukocytosis.  We will add on urinalysis as he is having some burning with urination and COVID test.  Chest x-ray interestingly does show 1/8 and ninth rib fractures which I suspect is from surgery potentially since his pain started immediately after surgery and he had no other trauma.  There is no pneumothorax or infiltrate.    UA does not suggest infection.  COVID test is negative.  Patient remained afebrile in the ED with stable blood pressure.  On reassessment he continues to feel well no longer feeling weak.  Unclear what the source of his fever is could be another viral infection just not tested for.  Given his reassuring work-up and exam I think he is appropriate for outpatient follow-up.  I did discuss return precautions to the ED if he is developing   FINAL CLINICAL IMPRESSION(S) / ED DIAGNOSES   Final diagnoses:  Fever, unspecified fever cause  Closed fracture of multiple ribs of left side, initial encounter  Rx / DC Orders   ED Discharge Orders     None        Note:  This document was prepared using Dragon voice recognition software and may include unintentional dictation errors.   Rada Hay, MD 08/23/22 [phone]

## 2022-08-23 NOTE — ED Triage Notes (Addendum)
Pt comes with c/o back surgery 3 weeks ago. Pt states fever and left sided rib pain. Pt states he thinks his ribs are broken.   Pt states fever this am. Pt took tylenol for it. Family also reports they think pt looks pale.

## 2022-08-23 NOTE — ED Notes (Addendum)
Pt A&O, IV removed, pt ambulating with steady gait, pt given discharge instructions.

## 2022-08-23 NOTE — Discharge Instructions (Signed)
Your blood work and urine sample were reassuring.  You do not have a urinary tract infection.  You have some blood in your urine you should follow-up with urology in regards to this.  See 2 rib fractures on the left side which could be related to your surgery since the pain seemed to start after that.  You can take Tylenol and the Lidoderm patch as needed for pain.  If you have persistent fevers or developing new symptoms that are concerning to you do not hesitate to return to the emergency department.

## 2022-08-31 ENCOUNTER — Ambulatory Visit: Payer: Medicare Other | Admitting: Urology

## 2022-09-05 ENCOUNTER — Other Ambulatory Visit: Payer: Self-pay | Admitting: Internal Medicine

## 2022-09-05 DIAGNOSIS — Z79899 Other long term (current) drug therapy: Secondary | ICD-10-CM | POA: Diagnosis not present

## 2022-09-05 DIAGNOSIS — M48061 Spinal stenosis, lumbar region without neurogenic claudication: Secondary | ICD-10-CM | POA: Diagnosis not present

## 2022-09-05 DIAGNOSIS — M542 Cervicalgia: Secondary | ICD-10-CM | POA: Diagnosis not present

## 2022-09-05 DIAGNOSIS — G8918 Other acute postprocedural pain: Secondary | ICD-10-CM | POA: Diagnosis not present

## 2022-09-05 DIAGNOSIS — M47812 Spondylosis without myelopathy or radiculopathy, cervical region: Secondary | ICD-10-CM | POA: Diagnosis not present

## 2022-09-05 DIAGNOSIS — M25579 Pain in unspecified ankle and joints of unspecified foot: Secondary | ICD-10-CM | POA: Diagnosis not present

## 2022-09-05 DIAGNOSIS — G894 Chronic pain syndrome: Secondary | ICD-10-CM | POA: Diagnosis not present

## 2022-09-15 DIAGNOSIS — I509 Heart failure, unspecified: Secondary | ICD-10-CM | POA: Diagnosis not present

## 2022-09-15 DIAGNOSIS — K219 Gastro-esophageal reflux disease without esophagitis: Secondary | ICD-10-CM | POA: Diagnosis not present

## 2022-09-15 DIAGNOSIS — R0789 Other chest pain: Secondary | ICD-10-CM | POA: Diagnosis not present

## 2022-09-15 DIAGNOSIS — B9561 Methicillin susceptible Staphylococcus aureus infection as the cause of diseases classified elsewhere: Secondary | ICD-10-CM | POA: Diagnosis not present

## 2022-09-15 DIAGNOSIS — T8140XA Infection following a procedure, unspecified, initial encounter: Secondary | ICD-10-CM | POA: Diagnosis not present

## 2022-09-15 DIAGNOSIS — I10 Essential (primary) hypertension: Secondary | ICD-10-CM | POA: Diagnosis not present

## 2022-09-15 DIAGNOSIS — I2510 Atherosclerotic heart disease of native coronary artery without angina pectoris: Secondary | ICD-10-CM | POA: Diagnosis not present

## 2022-09-15 DIAGNOSIS — E785 Hyperlipidemia, unspecified: Secondary | ICD-10-CM | POA: Diagnosis not present

## 2022-09-15 DIAGNOSIS — T8142XA Infection following a procedure, deep incisional surgical site, initial encounter: Secondary | ICD-10-CM | POA: Diagnosis not present

## 2022-09-15 DIAGNOSIS — I110 Hypertensive heart disease with heart failure: Secondary | ICD-10-CM | POA: Diagnosis not present

## 2022-09-15 DIAGNOSIS — F32A Depression, unspecified: Secondary | ICD-10-CM | POA: Diagnosis not present

## 2022-09-15 DIAGNOSIS — G8929 Other chronic pain: Secondary | ICD-10-CM | POA: Diagnosis not present

## 2022-09-15 DIAGNOSIS — I252 Old myocardial infarction: Secondary | ICD-10-CM | POA: Diagnosis not present

## 2022-09-15 DIAGNOSIS — Z859 Personal history of malignant neoplasm, unspecified: Secondary | ICD-10-CM | POA: Diagnosis not present

## 2022-09-15 DIAGNOSIS — T8149XA Infection following a procedure, other surgical site, initial encounter: Secondary | ICD-10-CM | POA: Diagnosis not present

## 2022-09-15 DIAGNOSIS — T8132XA Disruption of internal operation (surgical) wound, not elsewhere classified, initial encounter: Secondary | ICD-10-CM | POA: Diagnosis not present

## 2022-09-15 DIAGNOSIS — Z8739 Personal history of other diseases of the musculoskeletal system and connective tissue: Secondary | ICD-10-CM | POA: Diagnosis not present

## 2022-09-20 DIAGNOSIS — T8149XA Infection following a procedure, other surgical site, initial encounter: Secondary | ICD-10-CM | POA: Diagnosis not present

## 2022-09-23 DIAGNOSIS — T8149XA Infection following a procedure, other surgical site, initial encounter: Secondary | ICD-10-CM | POA: Diagnosis not present

## 2022-09-28 ENCOUNTER — Other Ambulatory Visit: Payer: Self-pay | Admitting: Internal Medicine

## 2022-09-30 ENCOUNTER — Other Ambulatory Visit: Payer: Self-pay | Admitting: Internal Medicine

## 2022-09-30 DIAGNOSIS — E876 Hypokalemia: Secondary | ICD-10-CM

## 2022-09-30 DIAGNOSIS — T8149XA Infection following a procedure, other surgical site, initial encounter: Secondary | ICD-10-CM | POA: Diagnosis not present

## 2022-09-30 NOTE — Telephone Encounter (Signed)
Please refill as per office routine med refill policy (all routine meds to be refilled for 3 mo or monthly (per pt preference) up to one year from last visit, then month to month grace period for 3 mo, then further med refills will have to be denied) ? ?

## 2022-10-04 DIAGNOSIS — T847XXD Infection and inflammatory reaction due to other internal orthopedic prosthetic devices, implants and grafts, subsequent encounter: Secondary | ICD-10-CM | POA: Diagnosis not present

## 2022-10-07 DIAGNOSIS — T8149XA Infection following a procedure, other surgical site, initial encounter: Secondary | ICD-10-CM | POA: Diagnosis not present

## 2022-10-08 DIAGNOSIS — T8149XA Infection following a procedure, other surgical site, initial encounter: Secondary | ICD-10-CM | POA: Diagnosis not present

## 2022-10-11 ENCOUNTER — Other Ambulatory Visit: Payer: Self-pay | Admitting: Internal Medicine

## 2022-10-11 NOTE — Telephone Encounter (Signed)
Please refill as per office routine med refill policy (all routine meds to be refilled for 3 mo or monthly (per pt preference) up to one year from last visit, then month to month grace period for 3 mo, then further med refills will have to be denied) ? ?

## 2022-10-14 DIAGNOSIS — T8149XA Infection following a procedure, other surgical site, initial encounter: Secondary | ICD-10-CM | POA: Diagnosis not present

## 2022-10-15 DIAGNOSIS — T8149XA Infection following a procedure, other surgical site, initial encounter: Secondary | ICD-10-CM | POA: Diagnosis not present

## 2022-10-21 DIAGNOSIS — T8149XA Infection following a procedure, other surgical site, initial encounter: Secondary | ICD-10-CM | POA: Diagnosis not present

## 2022-10-22 DIAGNOSIS — T8149XA Infection following a procedure, other surgical site, initial encounter: Secondary | ICD-10-CM | POA: Diagnosis not present

## 2022-10-27 DIAGNOSIS — T847XXD Infection and inflammatory reaction due to other internal orthopedic prosthetic devices, implants and grafts, subsequent encounter: Secondary | ICD-10-CM | POA: Diagnosis not present

## 2022-10-27 DIAGNOSIS — Z23 Encounter for immunization: Secondary | ICD-10-CM | POA: Diagnosis not present

## 2022-10-27 DIAGNOSIS — Z792 Long term (current) use of antibiotics: Secondary | ICD-10-CM | POA: Diagnosis not present

## 2022-10-27 DIAGNOSIS — A4901 Methicillin susceptible Staphylococcus aureus infection, unspecified site: Secondary | ICD-10-CM | POA: Diagnosis not present

## 2022-10-27 DIAGNOSIS — Z452 Encounter for adjustment and management of vascular access device: Secondary | ICD-10-CM | POA: Diagnosis not present

## 2022-11-03 DIAGNOSIS — G4700 Insomnia, unspecified: Secondary | ICD-10-CM | POA: Diagnosis not present

## 2022-11-03 DIAGNOSIS — M47817 Spondylosis without myelopathy or radiculopathy, lumbosacral region: Secondary | ICD-10-CM | POA: Diagnosis not present

## 2022-11-03 DIAGNOSIS — M47812 Spondylosis without myelopathy or radiculopathy, cervical region: Secondary | ICD-10-CM | POA: Diagnosis not present

## 2022-11-03 DIAGNOSIS — G894 Chronic pain syndrome: Secondary | ICD-10-CM | POA: Diagnosis not present

## 2022-11-03 DIAGNOSIS — M542 Cervicalgia: Secondary | ICD-10-CM | POA: Diagnosis not present

## 2022-11-03 DIAGNOSIS — K5909 Other constipation: Secondary | ICD-10-CM | POA: Diagnosis not present

## 2022-11-03 DIAGNOSIS — M48061 Spinal stenosis, lumbar region without neurogenic claudication: Secondary | ICD-10-CM | POA: Diagnosis not present

## 2022-11-03 DIAGNOSIS — Z79899 Other long term (current) drug therapy: Secondary | ICD-10-CM | POA: Diagnosis not present

## 2022-11-22 DIAGNOSIS — T847XXD Infection and inflammatory reaction due to other internal orthopedic prosthetic devices, implants and grafts, subsequent encounter: Secondary | ICD-10-CM | POA: Diagnosis not present

## 2022-11-24 DIAGNOSIS — Z4889 Encounter for other specified surgical aftercare: Secondary | ICD-10-CM | POA: Diagnosis not present

## 2022-12-09 ENCOUNTER — Telehealth: Payer: Self-pay | Admitting: Internal Medicine

## 2022-12-09 NOTE — Telephone Encounter (Signed)
*  STAT* If patient is at the pharmacy, call can be transferred to refill team.   1. Which medications need to be refilled? (please list name of each medication and dose if known) furosemide (LASIX) 40 MG tablet   2. Which pharmacy/location (including street and city if local pharmacy) is medication to be sent to?  [place], [place]    3. Do they need a 30 day or 90 day supply? 30 day  Patient is completely out of medication

## 2022-12-12 ENCOUNTER — Telehealth: Payer: Self-pay | Admitting: Internal Medicine

## 2022-12-12 NOTE — Telephone Encounter (Signed)
Pt's wife would like a callback regarding possible blockage in pt's legs. She would like to know if pt is able to have a referral to see Dr. Fletcher Anon. Please advise

## 2022-12-12 NOTE — Progress Notes (Deleted)
Office Visit    Patient Name: Albert Wade Date of Encounter: 12/12/2022  Primary Care Provider:  Biagio Borg, MD Primary Cardiologist:  Dorris Carnes, MD Primary Electrophysiologist: None  Chief Complaint    Albert Wade is a 76 y.o. male with PMH of HLD, HTN, COPD, MI with angioplasty in 1993, mild CAD s/p LHC 2011, COPD (unable to tolerate CPAP), prostate CA s/p radiation therapy, GERD who presents today for complaint of lower extremity edema.  Past Medical History    Past Medical History:  Diagnosis Date   Acute bronchitis 01/01/2015   Acute upper respiratory infection 05/19/2016   Anginal pain (HCC)    Anxiety    Arthritis    back,wrists,hx. 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   Confusion 04/27/2017   COPD (chronic obstructive pulmonary disease) ([place])    Depression    Encounter for preventative adult health care exam with abnormal findings 12/20/2013   Fever 04/27/2017   GERD (gastroesophageal reflux disease)    Gynecomastia 05/12/2015   H/O hiatal hernia    pts wife states pt does not have    Heart murmur    History of kidney stones    HTN (hypertension)    Hypercholesteremia    Hyperglycemia 05/19/2016   HYPERLIPIDEMIA 04/29/2008   Qualifier: Diagnosis of  By: Jim Like     Impaired glucose tolerance 12/20/2013   Insomnia 04/27/2017   Lumbosacral radiculopathy 10/14/2016   MI (myocardial infarction) [facility])    mid-'90's   MYOCARDIAL INFARCTION 04/29/2008   Qualifier: History of  By: Jim Like     OSA (obstructive sleep apnea) 05/13/2011   Split night study 2011:  AHI 21/hr with obstructive and central events.  Placed on cpap and  titrated to 13cm with reasonable control, but attempts to increase pressure increased the number of central events.  Medium resmed quattro full face mask used.     Peripheral neuropathy    peripheral neuropathy   Phimosis/adherent prepuce 10/21/2013   Pneumonia    Prostate cancer ([place])    Shortness of breath 11/05/2010   Qualifier: Diagnosis of  By: Marilynne Halsted, RN, BSN, Jacquelyn     Sleep apnea    mild-no cpap, unable to tolerate   Squamous cell carcinoma of skin 10/29/2020   right mid volar forearm   Umbilical hernia    Umbilical hernia without obstruction and without gangrene 05/19/2016   URI (upper respiratory infection) 04/27/2017   UTI (urinary tract infection) 12/01/2016   Varicose veins    Varicose veins of bilateral lower extremities with other complications 91/50/5697   Wheezing 01/01/2015   Past Surgical History:  Procedure Laterality Date   AMPUTATION TOE Right 04/01/2022   Procedure: AMPUTATION TOE-Right Great Toe Amputation;  Surgeon: Criselda Peaches, DPM;  Location: [facility] ORS;  Service: Podiatry;  Laterality: Right;   ANGIOPLASTY     APPENDECTOMY     ARTERY BIOPSY Right 10/15/2021   Procedure: BIOPSY TEMPORAL ARTERY;  Surgeon: Benjamine Sprague, DO;  Location: [facility] ORS;  Service: General;  Laterality: Right;   BACK SURGERY     x3  CARDIAC CATHETERIZATION     11'11   CIRCUMCISION N/A 10/21/2013   Procedure: CIRCUMCISION ADULT;  Surgeon: Bernestine Amass, MD;  Location: [facility];  Service: Urology;  Laterality: N/A;   COLONOSCOPY WITH PROPOFOL N/A 06/09/2021   Procedure: COLONOSCOPY WITH PROPOFOL;  Surgeon: Jonathon Bellows, MD;  Location: [facility] ENDOSCOPY;  Service: Gastroenterology;  Laterality: N/A;   CYSTOSCOPY N/A 10/21/2013   Procedure: CYSTOSCOPY FLEXIBLE;  Surgeon: Bernestine Amass, MD;  Location: [facility];  Service: Urology;  Laterality: N/A;   CYSTOSCOPY W/ URETERAL STENT PLACEMENT Right 06/01/2020   Procedure: CYSTOSCOPY WITH RETROGRADE PYELOGRAM/URETERAL STENT Exchange;  Surgeon:  Hollice Espy, MD;  Location: [facility] ORS;  Service: Urology;  Laterality: Right;   CYSTOSCOPY WITH BIOPSY Right 01/06/2020   Procedure: CYSTOSCOPY WITH BIOPSY;  Surgeon: Hollice Espy, MD;  Location: [facility] ORS;  Service: Urology;  Laterality: Right;   CYSTOSCOPY/URETEROSCOPY/HOLMIUM LASER/STENT PLACEMENT Right 01/06/2020   Procedure: CYSTOSCOPY/URETEROSCOPY/HOLMIUM LASER/STENT PLACEMENT;  Surgeon: Hollice Espy, MD;  Location: [facility] ORS;  Service: Urology;  Laterality: Right;   ESOPHAGOGASTRODUODENOSCOPY N/A 02/03/2021   Procedure: ESOPHAGOGASTRODUODENOSCOPY (EGD);  Surgeon: Jonathon Bellows, MD;  Location: [facility] ENDOSCOPY;  Service: Gastroenterology;  Laterality: N/A;   ESOPHAGOGASTRODUODENOSCOPY (EGD) WITH PROPOFOL N/A 03/09/2021   Procedure: ESOPHAGOGASTRODUODENOSCOPY (EGD) WITH PROPOFOL;  Surgeon: Jonathon Bellows, MD;  Location: [facility] ENDOSCOPY;  Service: Gastroenterology;  Laterality: N/A;   HERNIA REPAIR     INSERTION OF MESH N/A 06/17/2016   Procedure: INSERTION OF MESH;  Surgeon: Excell Seltzer, MD;  Location: [facility];  Service: General;  Laterality: N/A;   UMBILICAL HERNIA REPAIR N/A 06/17/2016   Procedure: REPAIR UMBILICAL HERNIA WITH MESH;  Surgeon: Excell Seltzer, MD;  Location: [facility];  Service: General;  Laterality: N/A;   WRIST SURGERY Right     Allergies  Allergies  Allergen Reactions   Cyclobenzaprine     agitation    Seroquel [Quetiapine] Hives    History of Present Illness    Albert Wade  is a 76 year old male with the above mention past medical history who presents today for complaint of lower extremity edema.  He was initially seen by Dr. Harrington Challenger in 2010 by self-referral.  He has had complaints of lower extremity edema since 2011 that was treated with Lasix provided by PCP with mild decrease in edema.  2D echo was recommended and patient underwent nuclear stress test that revealed normal BP response and no significant ST segment changes with normal stress study.  He was seen  in 2020 for preop clearance and had complained of worsening leg pain with walking and was sent for ABIs and lower extremity arterial Dopplers that showed no evidence of lower extremity arterial disease.  2D echo was completed in 2022 with EF of 55-60%, no RWMA, grade 1 DD, normal RV systolic function with severely dilated LA no valvular abnormalities.   Since last being seen in the office patient reports***.  Patient denies chest pain, palpitations, dyspnea, PND, orthopnea, nausea, vomiting, dizziness, syncope, edema, weight gain, or early satiety.     ***Notes: -Patient contacted office yesterday due to swollen and painful legs with request of referral to Dr. Velva Harman Home Medications    Current Outpatient Medications  Medication Sig Dispense Refill   albuterol (VENTOLIN HFA) 108 (90 Base) MCG/ACT inhaler INHALE 2 PUFFS INTO THE LUNGS 4 TIMES DAILY AS NEEDED 8.5 g 2   ALPRAZolam (XANAX) 0.5 MG tablet Take 0.5 mg by mouth 2 (two) times daily as needed for anxiety.  amLODipine (NORVASC) 2.5 MG tablet TAKE 1 TABLET BY MOUTH DAILY 90 tablet 2   aspirin 81 MG tablet Take 81 mg by mouth daily.     benzonatate (TESSALON) 200 MG capsule Take 1 capsule (200 mg total) by mouth 2 (two) times daily as needed for cough. 20 capsule 0   blood glucose meter kit and supplies KIT Dispense based on patient and insurance preference. Use up to four times daily as directed. 1 each 0   DULoxetine (CYMBALTA) 60 MG capsule Take 60 mg by mouth 2 (two) times daily.      furosemide (LASIX) 40 MG tablet TAKE 1 TABLET BY MOUTH 2 TIMES DAILY 60 tablet 0   omeprazole (PRILOSEC) 20 MG capsule TAKE ONE CAPSULE BY MOUTH TWICE A DAY 180 capsule 3   oxyCODONE-acetaminophen (PERCOCET) 10-325 MG tablet Take 1 tablet by mouth every 4 (four) hours as needed for pain. (Patient taking differently: Take 2 tablets by mouth 4 (four) times daily. Max 8 per day) 30 tablet 0   potassium chloride (KLOR-CON M) 10 MEQ tablet TAKE 1 TABLET BY  MOUTH DAILY 30 tablet 0   simvastatin (ZOCOR) 40 MG tablet TAKE 1 TABLET BY MOUTH AT BEDTIME 90 tablet 2   traZODone (DESYREL) 50 MG tablet Take 1 tablet (50 mg total) by mouth at bedtime as needed for sleep. (Patient taking differently: Take 150 mg by mouth at bedtime as needed for sleep. Take 2-3 tablets at bedtime as needed) 30 tablet 0   No current facility-administered medications for this visit.     Review of Systems  Please see the history of present illness.    (+)*** (+)***  All other systems reviewed and are otherwise negative except as noted above.  Physical Exam    Wt Readings from Last 3 Encounters:  05/30/22 183 lb (83 kg)  04/01/22 185 lb (83.9 kg)  10/16/21 205 lb 14.6 oz (93.4 kg)   YK:DXIPJ were no vitals filed for this visit.,There is no height or weight on file to calculate BMI.  Constitutional:      Appearance: Healthy appearance. Not in distress.  Neck:     Vascular: JVD normal.  Pulmonary:     Effort: Pulmonary effort is normal.     Breath sounds: No wheezing. No rales. Diminished in the bases Cardiovascular:     Normal rate. Regular rhythm. Normal S1. Normal S2.      Murmurs: There is no murmur.  Edema:    Peripheral edema absent.  Abdominal:     Palpations: Abdomen is soft non tender. There is no hepatomegaly.  Skin:    General: Skin is warm and dry.  Neurological:     General: No focal deficit present.     Mental Status: Alert and oriented to person, place and time.     Cranial Nerves: Cranial nerves are intact.  EKG/LABS/Other Studies Reviewed    ECG personally reviewed by me today - ***  Risk Assessment/Calculations:   {Does this patient have ATRIAL FIBRILLATION?:[phone]}        Lab Results  Component Value Date   WBC 7.5 08/23/2022   HGB 11.1 (L) 08/23/2022   HCT 35.0 (L) 08/23/2022   MCV 88.8 08/23/2022   PLT 207 08/23/2022   Lab Results  Component Value Date   CREATININE 1.09 08/23/2022   BUN 15 08/23/2022   NA 138  08/23/2022   K 4.1 08/23/2022   CL 107 08/23/2022   CO2 26 08/23/2022   Lab Results  Component Value Date   ALT 67 (H) 05/30/2022   AST 27 05/30/2022   ALKPHOS 179 (H) 05/30/2022   BILITOT 0.5 05/30/2022   Lab Results  Component Value Date   CHOL 121 07/21/2021   HDL 41.30 07/21/2021   LDLCALC 56 07/21/2021   LDLDIRECT 78.0 05/19/2016   TRIG 120.0 07/21/2021   CHOLHDL 3 07/21/2021    Lab Results  Component Value Date   HGBA1C 6.1 07/21/2021    Assessment & Plan    1.  Lower extremity edema: -Patient had lower extremity Dopplers and ABIs performed that showed no evidence of arterial disease in 2020  2.  Nonobstructive CAD: -S/p LHC 2011 with mild nonobstructive CAD and Myoview in 2017 with no significant ischemia and normal EF.  3.***  4.***      Disposition: Follow-up with Dorris Carnes, MD or APP in *** months {Are you ordering a CV Procedure (e.g. stress test, cath, DCCV, TEE, etc)?   Press F2        :[id number]}   Medication Adjustments/Labs and Tests Ordered: Current medicines are reviewed at length with the patient today.  Concerns regarding medicines are outlined above.   Signed, Mable Fill, Marissa Nestle, NP 12/12/2022, 8:54 PM [facility]  Note:  This document was prepared using Dragon voice recognition software and may include unintentional dictation errors.

## 2022-12-12 NOTE — Telephone Encounter (Signed)
Called pt spouse ok per DPR.  Reports she is concerned about pt legs.  Reports they are very swollen and are painful.  Spouse would like a referral to Dr. Fletcher Anon.  I advised spouse pt would need to have testing to confirm need to see Dr. Fletcher Anon before referral can be sent.   Reports when push finger into legs leaves an indentation.  Pt elevates legs very little reports pt is hard headed and doesn't do like he needs to.  Swelling has occurred for 2-3 weeks but is getting worse.  Pt is taking spouse furosemide as does not have furosemide.  BP is okay per spouse no recent readings. Does not perform QD wt advised to have pt check daily weights and write them down to help determine if pt is retaining fluid.  Expresses she will have pt check weights.  Scheduled pt to see Jaquelyn Bitter, NP 12/13/22 at 1:55 pm.  Pt is hesitant to see anyone besides Dr. Harrington Challenger.  Advised NP is able to care for pt and I did not cancel 01/16/23 OV with Dr.Ross.  Advised will let Jaquelyn Bitter determine if OV should be canceled.  No further concerns at this time.

## 2022-12-13 ENCOUNTER — Ambulatory Visit: Payer: Medicare Other | Admitting: Nurse Practitioner

## 2022-12-13 DIAGNOSIS — R600 Localized edema: Secondary | ICD-10-CM

## 2022-12-13 NOTE — Telephone Encounter (Signed)
Pts wife spoke with a triage nurse yesterday and was given an appt with Ambrose Pancoast NP for today.. she says she cannot make his appt today due to starting to run a fever... he has had many infections recently that [facility] has been treating him for... he has been out of lasix for a couple of weeks and has had no K for over a month... he has been taking 80 mg of his wife's lasix.... his lower extremities have been swollen and painful and getting worse daily... he is not feeling well.... his PCP Dr Gwynn Burly office advised him that he needs to be seen but he declined an appt with them since he has not felt well and does not want to be seen in the office... he canceled his appt today with out NP due to now running a mild fever.. I have urged her to call his MD at [facility] to alert him about the fever and the indication that his infection has either returned or worsening.   He has an appt with Dr Harrington Challenger 01/16/23... his last OV with her 09/16/21...   I advised his wife that I need to review his chart with the MD and I will call her back... but until then she needs to call his PCP and his Inf Dz MD.

## 2022-12-13 NOTE — Telephone Encounter (Signed)
Pt is requesting a refill on furosemide. Pt's PCP refilled this medication last. Would Dr. Harrington Challenger like to refill this medication until appt with Dr. Harrington Challenger in January 2024? Please address

## 2022-12-13 NOTE — Telephone Encounter (Signed)
*  STAT* If patient is at the pharmacy, call can be transferred to refill team.   1. Which medications need to be refilled? (please list name of each medication and dose if known) Furosemide  2. Which pharmacy/location (including street and city if local pharmacy) is medication to be sent to? [facility] [place]  3. Do they need a 30 day or 90 day supply? Enough until his appointment on 01-16-23 with Dr Harrington Challenger- was sick on 12-13-22 for his appointment- need this call in today please- does not have any

## 2022-12-13 NOTE — Telephone Encounter (Signed)
Left a message for the pt/ wife to call back.

## 2022-12-13 NOTE — Telephone Encounter (Signed)
I called to follow up with the pt and his wife and she reports that his fever is gone without meds... it was 100.3 all morning... she will reach out to his PCP... Dr Gwynn Burly office and see if they can get him in this week.... he was the original prescriber of his lasix.Marland KitchenMarland Kitchen I will call back to follow up with her.

## 2023-01-02 DIAGNOSIS — Z79899 Other long term (current) drug therapy: Secondary | ICD-10-CM | POA: Diagnosis not present

## 2023-01-02 DIAGNOSIS — G894 Chronic pain syndrome: Secondary | ICD-10-CM | POA: Diagnosis not present

## 2023-01-02 DIAGNOSIS — M25579 Pain in unspecified ankle and joints of unspecified foot: Secondary | ICD-10-CM | POA: Diagnosis not present

## 2023-01-02 DIAGNOSIS — M47812 Spondylosis without myelopathy or radiculopathy, cervical region: Secondary | ICD-10-CM | POA: Diagnosis not present

## 2023-01-02 DIAGNOSIS — M961 Postlaminectomy syndrome, not elsewhere classified: Secondary | ICD-10-CM | POA: Diagnosis not present

## 2023-01-02 DIAGNOSIS — M542 Cervicalgia: Secondary | ICD-10-CM | POA: Diagnosis not present

## 2023-01-03 ENCOUNTER — Encounter: Payer: Self-pay | Admitting: Internal Medicine

## 2023-01-03 ENCOUNTER — Other Ambulatory Visit: Payer: Self-pay | Admitting: Internal Medicine

## 2023-01-03 NOTE — Telephone Encounter (Signed)
Please refill as per office routine med refill policy (all routine meds to be refilled for 3 mo or monthly (per pt preference) up to one year from last visit, then month to month grace period for 3 mo, then further med refills will have to be denied) ? ?

## 2023-01-04 ENCOUNTER — Other Ambulatory Visit: Payer: Self-pay

## 2023-01-04 ENCOUNTER — Other Ambulatory Visit: Payer: Self-pay | Admitting: Internal Medicine

## 2023-01-04 MED ORDER — FUROSEMIDE 40 MG PO TABS: 40.0000 mg | ORAL_TABLET | Freq: Two times a day (BID) | ORAL | 0 refills | Status: DC

## 2023-01-04 MED ORDER — ALBUTEROL SULFATE HFA 108 (90 BASE) MCG/ACT IN AERS: INHALATION_SPRAY | RESPIRATORY_TRACT | 0 refills | Status: DC

## 2023-01-04 NOTE — Telephone Encounter (Signed)
Please refill as per office routine med refill policy (all routine meds to be refilled for 3 mo or monthly (per pt preference) up to one year from last visit, then month to month grace period for 3 mo, then further med refills will have to be denied) ? ?

## 2023-01-04 NOTE — Telephone Encounter (Signed)
Patient requesting refill for Lasix and albuterol, system not allowing me to reorder

## 2023-01-09 ENCOUNTER — Ambulatory Visit: Payer: Medicare Other | Admitting: Dermatology

## 2023-01-09 DIAGNOSIS — Z8589 Personal history of malignant neoplasm of other organs and systems: Secondary | ICD-10-CM

## 2023-01-09 DIAGNOSIS — L570 Actinic keratosis: Secondary | ICD-10-CM

## 2023-01-09 DIAGNOSIS — Z85828 Personal history of other malignant neoplasm of skin: Secondary | ICD-10-CM

## 2023-01-09 DIAGNOSIS — D229 Melanocytic nevi, unspecified: Secondary | ICD-10-CM

## 2023-01-09 DIAGNOSIS — C44629 Squamous cell carcinoma of skin of left upper limb, including shoulder: Secondary | ICD-10-CM | POA: Diagnosis not present

## 2023-01-09 DIAGNOSIS — Z1283 Encounter for screening for malignant neoplasm of skin: Secondary | ICD-10-CM | POA: Diagnosis not present

## 2023-01-09 DIAGNOSIS — L814 Other melanin hyperpigmentation: Secondary | ICD-10-CM | POA: Diagnosis not present

## 2023-01-09 DIAGNOSIS — D485 Neoplasm of uncertain behavior of skin: Secondary | ICD-10-CM

## 2023-01-09 DIAGNOSIS — L578 Other skin changes due to chronic exposure to nonionizing radiation: Secondary | ICD-10-CM | POA: Diagnosis not present

## 2023-01-09 DIAGNOSIS — L821 Other seborrheic keratosis: Secondary | ICD-10-CM | POA: Diagnosis not present

## 2023-01-09 NOTE — Progress Notes (Signed)
Follow-Up Visit   Subjective  Albert Wade is a 77 y.o. male who presents for the following: Annual Exam (History of SCC - The patient presents for Upper Body Skin Exam (UBSE) for skin cancer screening and mole check.  The patient has spots, moles and lesions to be evaluated, some may be new or changing and the patient has concerns that these could be cancer./).  Accompanied by wife  The following portions of the chart were reviewed this encounter and updated as appropriate:   Tobacco  Allergies  Meds  Problems  Med Hx  Surg Hx  Fam Hx     Review of Systems:  No other skin or systemic complaints except as noted in HPI or Assessment and Plan.  Objective  Well appearing patient in no apparent distress; mood and affect are within normal limits.  All skin waist up examined.  Left Forearm - Posterior Hyperkeratotic papule 1.5 x 1.0 cm  Face, ears (8) Erythematous thin papules/macules with gritty scale.    Assessment & Plan   History of Squamous Cell Carcinoma of the Skin - No evidence of recurrence today - No lymphadenopathy - Recommend regular full body skin exams - Recommend daily broad spectrum sunscreen SPF 30+ to sun-exposed areas, reapply every 2 hours as needed.  - Call if any new or changing lesions are noted between office visits  Lentigines - Scattered tan macules - Due to sun exposure - Benign-appearing, observe - Recommend daily broad spectrum sunscreen SPF 30+ to sun-exposed areas, reapply every 2 hours as needed. - Call for any changes  Seborrheic Keratoses - Stuck-on, waxy, tan-brown papules and/or plaques  - Benign-appearing - Discussed benign etiology and prognosis. - Observe - Call for any changes  Melanocytic Nevi - Tan-brown and/or pink-flesh-colored symmetric macules and papules - Benign appearing on exam today - Observation - Call clinic for new or changing moles - Recommend daily use of broad spectrum spf 30+ sunscreen to  sun-exposed areas.   Hemangiomas - Red papules - Discussed benign nature - Observe - Call for any changes  Actinic Damage - Chronic condition, secondary to cumulative UV/sun exposure - diffuse scaly erythematous macules with underlying dyspigmentation - Recommend daily broad spectrum sunscreen SPF 30+ to sun-exposed areas, reapply every 2 hours as needed.  - Staying in the shade or wearing long sleeves, sun glasses (UVA+UVB protection) and wide brim hats (4-inch brim around the entire circumference of the hat) are also recommended for sun protection.  - Call for new or changing lesions.  Skin cancer screening performed today.  Neoplasm of uncertain behavior of skin Left Forearm - Posterior  Epidermal / dermal shaving  Lesion diameter (cm):  1.5 Informed consent: discussed and consent obtained   Timeout: patient name, date of birth, surgical site, and procedure verified   Procedure prep:  Patient was prepped and draped in usual sterile fashion Prep type:  Isopropyl alcohol Anesthesia: the lesion was anesthetized in a standard fashion   Anesthetic:  1% lidocaine w/ epinephrine 1-100,000 buffered w/ 8.4% NaHCO3 Instrument used: flexible razor blade   Hemostasis achieved with: pressure, aluminum chloride and electrodesiccation   Outcome: patient tolerated procedure well   Post-procedure details: sterile dressing applied and wound care instructions given   Dressing type: bandage and petrolatum    Destruction of lesion Complexity: extensive   Destruction method: electrodesiccation and curettage   Informed consent: discussed and consent obtained   Timeout:  patient name, date of birth, surgical site, and procedure verified Procedure  prep:  Patient was prepped and draped in usual sterile fashion Prep type:  Isopropyl alcohol Anesthesia: the lesion was anesthetized in a standard fashion   Anesthetic:  1% lidocaine w/ epinephrine 1-100,000 buffered w/ 8.4% NaHCO3 Curettage performed  in three different directions: Yes   Electrodesiccation performed over the curetted area: Yes   Lesion length (cm):  1.5 Lesion width (cm):  1 Margin per side (cm):  0.2 Final wound size (cm):  1.9 Hemostasis achieved with:  pressure and aluminum chloride Outcome: patient tolerated procedure well with no complications   Post-procedure details: sterile dressing applied and wound care instructions given   Dressing type: bandage and petrolatum    Specimen 1 - Surgical pathology Differential Diagnosis: SCC vs other  Check Margins: No EDC today  AK (actinic keratosis) (8) Face, ears  Destruction of lesion - Face, ears Complexity: simple   Destruction method: cryotherapy   Informed consent: discussed and consent obtained   Timeout:  patient name, date of birth, surgical site, and procedure verified Lesion destroyed using liquid nitrogen: Yes   Region frozen until ice ball extended beyond lesion: Yes   Outcome: patient tolerated procedure well with no complications   Post-procedure details: wound care instructions given     Return in about 6 months (around 07/10/2023) for Botox follow up, AK follow up.  I, Ashok Cordia, CMA, am acting as scribe for Sarina Ser, MD . Documentation: I have reviewed the above documentation for accuracy and completeness, and I agree with the above.  Sarina Ser, MD

## 2023-01-09 NOTE — Patient Instructions (Addendum)
Cryotherapy Aftercare  Wash gently with soap and water everyday.   Apply Vaseline and Band-Aid daily until healed.  Wound Care Instructions  Cleanse wound gently with soap and water once a day then pat dry with clean gauze. Apply a thin coat of Petrolatum (petroleum jelly, "Vaseline") over the wound (unless you have an allergy to this). We recommend that you use a new, sterile tube of Vaseline. Do not pick or remove scabs. Do not remove the yellow or white "healing tissue" from the base of the wound.  Cover the wound with fresh, clean, nonstick gauze and secure with paper tape. You may use Band-Aids in place of gauze and tape if the wound is small enough, but would recommend trimming much of the tape off as there is often too much. Sometimes Band-Aids can irritate the skin.  You should call the office for your biopsy report after 1 week if you have not already been contacted.  If you experience any problems, such as abnormal amounts of bleeding, swelling, significant bruising, significant pain, or evidence of infection, please call the office immediately.  FOR ADULT SURGERY PATIENTS: If you need something for pain relief you may take 1 extra strength Tylenol (acetaminophen) AND 2 Ibuprofen (200mg each) together every 4 hours as needed for pain. (do not take these if you are allergic to them or if you have a reason you should not take them.) Typically, you may only need pain medication for 1 to 3 days.      Due to recent changes in healthcare laws, you may see results of your pathology and/or laboratory studies on MyChart before the doctors have had a chance to review them. We understand that in some cases there may be results that are confusing or concerning to you. Please understand that not all results are received at the same time and often the doctors may need to interpret multiple results in order to provide you with the best plan of care or course of treatment. Therefore, we ask that you  please give us 2 business days to thoroughly review all your results before contacting the office for clarification. Should we see a critical lab result, you will be contacted sooner.   If You Need Anything After Your Visit  If you have any questions or concerns for your doctor, please call our main line at [phone] and press option 4 to reach your doctor's medical assistant. If no one answers, please leave a voicemail as directed and we will return your call as soon as possible. Messages left after 4 pm will be answered the following business day.   You may also send us a message via MyChart. We typically respond to MyChart messages within 1-2 business days.  For prescription refills, please ask your pharmacy to contact our office. Our fax number is [phone].  If you have an urgent issue when the clinic is closed that cannot wait until the next business day, you can page your doctor at the number below.    Please note that while we do our best to be available for urgent issues outside of office hours, we are not available 24/7.   If you have an urgent issue and are unable to reach us, you may choose to seek medical care at your doctor's office, retail clinic, urgent care center, or emergency room.  If you have a medical emergency, please immediately call 911 or go to the emergency department.  Pager Numbers  - Dr. Kowalski: [phone]  -   Dr. Moye: [phone]  - Dr. Stewart: [phone]  In the event of inclement weather, please call our main line at [phone] for an update on the status of any delays or closures.  Dermatology Medication Tips: Please keep the boxes that topical medications come in in order to help keep track of the instructions about where and how to use these. Pharmacies typically print the medication instructions only on the boxes and not directly on the medication tubes.   If your medication is too expensive, please contact our office at  [phone] option 4 or send us a message through MyChart.   We are unable to tell what your co-pay for medications will be in advance as this is different depending on your insurance coverage. However, we may be able to find a substitute medication at lower cost or fill out paperwork to get insurance to cover a needed medication.   If a prior authorization is required to get your medication covered by your insurance company, please allow us 1-2 business days to complete this process.  Drug prices often vary depending on where the prescription is filled and some pharmacies may offer cheaper prices.  The website www.goodrx.com contains coupons for medications through different pharmacies. The prices here do not account for what the cost may be with help from insurance (it may be cheaper with your insurance), but the website can give you the price if you did not use any insurance.  - You can print the associated coupon and take it with your prescription to the pharmacy.  - You may also stop by our office during regular business hours and pick up a GoodRx coupon card.  - If you need your prescription sent electronically to a different pharmacy, notify our office through [place] MyChart or by phone at [phone] option 4.     Si Usted Necesita Algo Despus de Su Visita  Tambin puede enviarnos un mensaje a travs de MyChart. Por lo general respondemos a los mensajes de MyChart en el transcurso de 1 a 2 das hbiles.  Para renovar recetas, por favor pida a su farmacia que se ponga en contacto con nuestra oficina. Nuestro nmero de fax es el [phone].  Si tiene un asunto urgente cuando la clnica est cerrada y que no puede esperar hasta el siguiente da hbil, puede llamar/localizar a su doctor(a) al nmero que aparece a continuacin.   Por favor, tenga en cuenta que aunque hacemos todo lo posible para estar disponibles para asuntos urgentes fuera del horario de oficina, no estamos  disponibles las 24 horas del da, los 7 das de la semana.   Si tiene un problema urgente y no puede comunicarse con nosotros, puede optar por buscar atencin mdica  en el consultorio de su doctor(a), en una clnica privada, en un centro de atencin urgente o en una sala de emergencias.  Si tiene una emergencia mdica, por favor llame inmediatamente al 911 o vaya a la sala de emergencias.  Nmeros de bper  - Dr. Kowalski: [phone]  - Dra. Moye: [phone]  - Dra. Stewart: [phone]  En caso de inclemencias del tiempo, por favor llame a nuestra lnea principal al [phone] para una actualizacin sobre el estado de cualquier retraso o cierre.  Consejos para la medicacin en dermatologa: Por favor, guarde las cajas en las que vienen los medicamentos de uso tpico para ayudarle a seguir las instrucciones sobre dnde y cmo usarlos. Las farmacias generalmente imprimen las instrucciones del medicamento slo en las cajas y   no directamente en los tubos del medicamento.   Si su medicamento es muy caro, por favor, pngase en contacto con nuestra oficina llamando al [phone] y presione la opcin 4 o envenos un mensaje a travs de MyChart.   No podemos decirle cul ser su copago por los medicamentos por adelantado ya que esto es diferente dependiendo de la cobertura de su seguro. Sin embargo, es posible que podamos encontrar un medicamento sustituto a menor costo o llenar un formulario para que el seguro cubra el medicamento que se considera necesario.   Si se requiere una autorizacin previa para que su compaa de seguros cubra su medicamento, por favor permtanos de 1 a 2 das hbiles para completar este proceso.  Los precios de los medicamentos varan con frecuencia dependiendo del lugar de dnde se surte la receta y alguna farmacias pueden ofrecer precios ms baratos.  El sitio web www.goodrx.com tiene cupones para medicamentos de diferentes farmacias. Los precios aqu no  tienen en cuenta lo que podra costar con la ayuda del seguro (puede ser ms barato con su seguro), pero el sitio web puede darle el precio si no utiliz ningn seguro.  - Puede imprimir el cupn correspondiente y llevarlo con su receta a la farmacia.  - Tambin puede pasar por nuestra oficina durante el horario de atencin regular y recoger una tarjeta de cupones de GoodRx.  - Si necesita que su receta se enve electrnicamente a una farmacia diferente, informe a nuestra oficina a travs de MyChart de [place] o por telfono llamando al [phone] y presione la opcin 4.  

## 2023-01-12 ENCOUNTER — Encounter: Payer: Medicare Other | Admitting: Dermatology

## 2023-01-12 DIAGNOSIS — G894 Chronic pain syndrome: Secondary | ICD-10-CM | POA: Diagnosis not present

## 2023-01-12 DIAGNOSIS — M797 Fibromyalgia: Secondary | ICD-10-CM | POA: Diagnosis not present

## 2023-01-12 DIAGNOSIS — Z79899 Other long term (current) drug therapy: Secondary | ICD-10-CM | POA: Diagnosis not present

## 2023-01-12 DIAGNOSIS — M47812 Spondylosis without myelopathy or radiculopathy, cervical region: Secondary | ICD-10-CM | POA: Diagnosis not present

## 2023-01-12 DIAGNOSIS — M542 Cervicalgia: Secondary | ICD-10-CM | POA: Diagnosis not present

## 2023-01-12 DIAGNOSIS — M48061 Spinal stenosis, lumbar region without neurogenic claudication: Secondary | ICD-10-CM | POA: Diagnosis not present

## 2023-01-12 DIAGNOSIS — K5909 Other constipation: Secondary | ICD-10-CM | POA: Diagnosis not present

## 2023-01-13 DIAGNOSIS — A4901 Methicillin susceptible Staphylococcus aureus infection, unspecified site: Secondary | ICD-10-CM | POA: Diagnosis not present

## 2023-01-13 DIAGNOSIS — Z792 Long term (current) use of antibiotics: Secondary | ICD-10-CM | POA: Diagnosis not present

## 2023-01-13 DIAGNOSIS — T847XXD Infection and inflammatory reaction due to other internal orthopedic prosthetic devices, implants and grafts, subsequent encounter: Secondary | ICD-10-CM | POA: Diagnosis not present

## 2023-01-15 NOTE — Progress Notes (Signed)
Cardiology Office Note   Date:  01/15/2023   ID:  Albert, Wade [birth year], [name] [id number]  PCP:  Biagio Borg, MD  Cardiologist:   Dorris Carnes, MD    F/U of mild CAD and HTN   History of Present Illness: Albert Wade is a 77 y.o. male with a history of mild CAD by cath in 2011, COPD ,HL and HTN  The pt was last in cardiology clinic by E Monge as part of a preop risk assesment prior to back surgery    Per pt and his wife he had a very difficlult time with wound infection.      From cardiac standpoint he denies CP   Breathing is OK       Outpatient Medications Prior to Visit  Medication Sig Dispense Refill   albuterol (VENTOLIN HFA) 108 (90 Base) MCG/ACT inhaler INHALE 2 PUFFS INTO THE LUNGS 4 TIMES DAILY AS NEEDED 8.5 g 0   ALPRAZolam (XANAX) 0.5 MG tablet Take 0.5 mg by mouth 2 (two) times daily as needed for anxiety.     amLODipine (NORVASC) 2.5 MG tablet TAKE 1 TABLET BY MOUTH DAILY 90 tablet 2   aspirin 81 MG tablet Take 81 mg by mouth daily.     benzonatate (TESSALON) 200 MG capsule Take 1 capsule (200 mg total) by mouth 2 (two) times daily as needed for cough. 20 capsule 0   blood glucose meter kit and supplies KIT Dispense based on patient and insurance preference. Use up to four times daily as directed. 1 each 0   DULoxetine (CYMBALTA) 60 MG capsule Take 60 mg by mouth 2 (two) times daily.      furosemide (LASIX) 40 MG tablet Take 1 tablet (40 mg total) by mouth 2 (two) times daily. 60 tablet 0   omeprazole (PRILOSEC) 20 MG capsule TAKE ONE CAPSULE BY MOUTH TWICE A DAY 180 capsule 3   oxyCODONE-acetaminophen (PERCOCET) 10-325 MG tablet Take 1 tablet by mouth every 4 (four) hours as needed for pain. (Patient taking differently: Take 2 tablets by mouth 4 (four) times daily. Max 8 per day) 30 tablet 0   potassium chloride (KLOR-CON M) 10 MEQ tablet TAKE 1 TABLET BY MOUTH DAILY 30 tablet 0   simvastatin (ZOCOR) 40 MG tablet TAKE 1 TABLET BY MOUTH AT BEDTIME 90 tablet  2   traZODone (DESYREL) 50 MG tablet Take 1 tablet (50 mg total) by mouth at bedtime as needed for sleep. (Patient taking differently: Take 150 mg by mouth at bedtime as needed for sleep. Take 2-3 tablets at bedtime as needed) 30 tablet 0   No facility-administered medications prior to visit.     Allergies:   Cyclobenzaprine and Seroquel [quetiapine]   Past Medical History:  Diagnosis Date   Acute bronchitis 01/01/2015   Acute upper respiratory infection 05/19/2016   Anginal pain (HCC)    Anxiety    Arthritis    back,wrists,hx. 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   Confusion 04/27/2017   COPD (chronic obstructive pulmonary disease) ([place])    Depression    Encounter for preventative  adult health care exam with abnormal findings 12/20/2013   Fever 04/27/2017   GERD (gastroesophageal reflux disease)    Gynecomastia 05/12/2015   H/O hiatal hernia    pts wife states pt does not have    Heart murmur    History of kidney stones    HTN (hypertension)    Hypercholesteremia    Hyperglycemia 05/19/2016   HYPERLIPIDEMIA 04/29/2008   Qualifier: Diagnosis of  By: Jim Like     Impaired glucose tolerance 12/20/2013   Insomnia 04/27/2017   Lumbosacral radiculopathy 10/14/2016   MI (myocardial infarction) [facility])    mid-'90's   MYOCARDIAL INFARCTION 04/29/2008   Qualifier: History of  By: Jim Like     OSA (obstructive sleep apnea) 05/13/2011   Split night study 2011:  AHI 21/hr with obstructive and central events.  Placed on cpap and titrated to 13cm with reasonable control, but attempts to increase pressure increased the number of central events.  Medium resmed quattro  full face mask used.     Peripheral neuropathy    peripheral neuropathy   Phimosis/adherent prepuce 10/21/2013   Pneumonia    Prostate cancer ([place])    Shortness of breath 11/05/2010   Qualifier: Diagnosis of  By: Marilynne Halsted, RN, BSN, Jacquelyn     Sleep apnea    mild-no cpap, unable to tolerate   Squamous cell carcinoma of skin 10/29/2020   right mid volar forearm   Umbilical hernia    Umbilical hernia without obstruction and without gangrene 05/19/2016   URI (upper respiratory infection) 04/27/2017   UTI (urinary tract infection) 12/01/2016   Varicose veins    Varicose veins of bilateral lower extremities with other complications 63/89/3734   Wheezing 01/01/2015    Past Surgical History:  Procedure Laterality Date   AMPUTATION TOE Right 04/01/2022   Procedure: AMPUTATION TOE-Right Great Toe Amputation;  Surgeon: Criselda Peaches, DPM;  Location: [facility] ORS;  Service: Podiatry;  Laterality: Right;   ANGIOPLASTY     APPENDECTOMY     ARTERY BIOPSY Right 10/15/2021   Procedure: BIOPSY TEMPORAL ARTERY;  Surgeon: Benjamine Sprague, DO;  Location: [facility] ORS;  Service: General;  Laterality: Right;   BACK SURGERY     x3   CARDIAC CATHETERIZATION     11'11   CIRCUMCISION N/A 10/21/2013   Procedure: CIRCUMCISION ADULT;  Surgeon: Bernestine Amass, MD;  Location: [facility];  Service: Urology;  Laterality: N/A;   COLONOSCOPY WITH PROPOFOL N/A 06/09/2021   Procedure: COLONOSCOPY WITH PROPOFOL;  Surgeon: Jonathon Bellows, MD;  Location[facility] ENDOSCOPY;  Service: Gastroenterology;  Laterality: N/A;   CYSTOSCOPY N/A 10/21/2013   Procedure: CYSTOSCOPY FLEXIBLE;  Surgeon: Bernestine Amass, MD;  Location: [facility];  Service: Urology;  Laterality: N/A;   CYSTOSCOPY W/ URETERAL STENT PLACEMENT Right 06/01/2020   Procedure: CYSTOSCOPY WITH RETROGRADE PYELOGRAM/URETERAL STENT Exchange;  Surgeon: Hollice Espy, MD;  Location: [facility] ORS;  Service: Urology;  Laterality: Right;   CYSTOSCOPY WITH BIOPSY Right 01/06/2020   Procedure:  CYSTOSCOPY WITH BIOPSY;  Surgeon: Hollice Espy, MD;  Location: [facility] ORS;  Service: Urology;  Laterality: Right;   CYSTOSCOPY/URETEROSCOPY/HOLMIUM LASER/STENT PLACEMENT Right 01/06/2020   Procedure: CYSTOSCOPY/URETEROSCOPY/HOLMIUM LASER/STENT PLACEMENT;  Surgeon: Hollice Espy, MD;  Location: [facility] ORS;  Service: Urology;  Laterality: Right;   ESOPHAGOGASTRODUODENOSCOPY N/A 02/03/2021   Procedure: ESOPHAGOGASTRODUODENOSCOPY (EGD);  Surgeon: Jonathon Bellows, MD;  Location[facility] ENDOSCOPY;  Service: Gastroenterology;  Laterality: N/A;   ESOPHAGOGASTRODUODENOSCOPY (EGD) WITH PROPOFOL N/A 03/09/2021   Procedure: ESOPHAGOGASTRODUODENOSCOPY (EGD) WITH PROPOFOL;  Surgeon:  Jonathon Bellows, MD;  Location: [facility] ENDOSCOPY;  Service: Gastroenterology;  Laterality: N/A;   HERNIA REPAIR     INSERTION OF MESH N/A 06/17/2016   Procedure: INSERTION OF MESH;  Surgeon: Excell Seltzer, MD;  Location: [facility];  Service: General;  Laterality: N/A;   UMBILICAL HERNIA REPAIR N/A 06/17/2016   Procedure: REPAIR UMBILICAL HERNIA WITH MESH;  Surgeon: Excell Seltzer, MD;  Location: [facility];  Service: General;  Laterality: N/A;   WRIST SURGERY Right      Social History:  The patient  reports that he quit smoking about 27 years ago. His smoking use included cigarettes. He has a 20.00 pack-year smoking history. He has never used smokeless tobacco. He reports that he does not drink alcohol and does not use drugs.   Family History:  The patient's family history includes Emphysema in his father; Hyperlipidemia in his father and mother; Prostate cancer in his father.    ROS:  Please see the history of present illness. All other systems are reviewed and  Negative to the above problem except as noted.    PHYSICAL EXAM: VS:  BP  148/56  P 62   Wt 192 lb GEN:  Obese 77 yo  in no acute distress  HEENT: normal  Neck: no JVD, no carotid bruit Cardiac: RRR; no murmurs,,no LE edema   + varicosities Respiratory:  CTA  GI: soft,  nontender, nondistended, + BS  No hepatomegaly  MS: no deformity Moving all extremities   Skin: warm and dry, no rash Neuro:  Strength and sensation are intact Psych: euthymic mood, full affect   EKG:  EKG is not ordered today    Lipid Panel    Component Value Date/Time   CHOL 121 07/21/2021 0948   CHOL 124 02/14/2020 1314   TRIG 120.0 07/21/2021 0948   HDL 41.30 07/21/2021 0948   HDL 46 02/14/2020 1314   CHOLHDL 3 07/21/2021 0948   VLDL 24.0 07/21/2021 0948   LDLCALC 56 07/21/2021 0948   LDLCALC 57 02/14/2020 1314   LDLDIRECT 78.0 05/19/2016 1610      Wt Readings from Last 3 Encounters:  05/30/22 183 lb (83 kg)  04/01/22 185 lb (83.9 kg)  10/16/21 205 lb 14.6 oz (93.4 kg)      ASSESSMENT AND PLAN:  1   CAD   Mild by cath in 2011  Myovue in march 2017 showed no significant ischemia   LVEF normal PT without symptoms of angina From a cardiac standpoint I think that pt is at relatively low and acceptable risk for surgery  OK to proceed without further testing .   2.  HTN   BP is high today  Will review labs and adjust      Follow at home   3  HL  Will get lipomed panel today   Will check BMET, CBC, PSA, lipomed and Hgb A1C today      Signed, Dorris Carnes, MD  01/15/2023 7:36 PM    [address], [place], [place]  [address] Phone: [phone]; Fax: [phone]

## 2023-01-16 ENCOUNTER — Ambulatory Visit: Payer: Medicare Other | Attending: Internal Medicine | Admitting: Internal Medicine

## 2023-01-16 ENCOUNTER — Telehealth: Payer: Self-pay

## 2023-01-16 ENCOUNTER — Encounter: Payer: Self-pay | Admitting: Internal Medicine

## 2023-01-16 VITALS — BP 148/56 | HR 62 | Ht 70.0 in | Wt 192.0 lb

## 2023-01-16 DIAGNOSIS — I83893 Varicose veins of bilateral lower extremities with other complications: Secondary | ICD-10-CM | POA: Diagnosis not present

## 2023-01-16 DIAGNOSIS — G8929 Other chronic pain: Secondary | ICD-10-CM

## 2023-01-16 DIAGNOSIS — M5441 Lumbago with sciatica, right side: Secondary | ICD-10-CM

## 2023-01-16 DIAGNOSIS — R609 Edema, unspecified: Secondary | ICD-10-CM | POA: Diagnosis not present

## 2023-01-16 DIAGNOSIS — I10 Essential (primary) hypertension: Secondary | ICD-10-CM | POA: Diagnosis not present

## 2023-01-16 DIAGNOSIS — M5442 Lumbago with sciatica, left side: Secondary | ICD-10-CM

## 2023-01-16 DIAGNOSIS — G4733 Obstructive sleep apnea (adult) (pediatric): Secondary | ICD-10-CM | POA: Diagnosis not present

## 2023-01-16 DIAGNOSIS — N400 Enlarged prostate without lower urinary tract symptoms: Secondary | ICD-10-CM

## 2023-01-16 DIAGNOSIS — I5032 Chronic diastolic (congestive) heart failure: Secondary | ICD-10-CM | POA: Diagnosis not present

## 2023-01-16 DIAGNOSIS — G4700 Insomnia, unspecified: Secondary | ICD-10-CM | POA: Diagnosis not present

## 2023-01-16 LAB — CBC

## 2023-01-16 NOTE — Patient Instructions (Addendum)
Medication Instructions:   *If you need a refill on your cardiac medications before your next appointment, please call your pharmacy*   Lab Work: HGBA1C, BMET, CBC,NMR, PSA If you have labs (blood work) drawn today and your tests are completely normal, you will receive your results only by: [place] (if you have MyChart) OR A paper copy in the mail If you have any lab test that is abnormal or we need to change your treatment, we will call you to review the results.   Testing/Procedures:    Follow-Up: At [facility], you and your health needs are our priority.  As part of our continuing mission to provide you with exceptional heart care, we have created designated Provider Care Teams.  These Care Teams include your primary Cardiologist (physician) and Advanced Practice Providers (APPs -  Physician Assistants and Nurse Practitioners) who all work together to provide you with the care you need, when you need it.  We recommend signing up for the patient portal called "MyChart".  Sign up information is provided on this After Visit Summary.  MyChart is used to connect with patients for Virtual Visits (Telemedicine).  Patients are able to view lab/test results, encounter notes, upcoming appointments, etc.  Non-urgent messages can be sent to your provider as well.   To learn more about what you can do with MyChart, go to [email or web address].    Your next appointment:   1 year(s)  Provider:   Dorris Carnes, MD     Other Instructions REFERRAL TO DR Gardenia Phlegm  REFERRAL TO DR. Marjean Donna

## 2023-01-16 NOTE — Telephone Encounter (Signed)
Advised patient of results/hd  

## 2023-01-16 NOTE — Telephone Encounter (Signed)
-----  Message from Ralene Bathe, MD sent at 01/16/2023  1:47 PM EST ----- Diagnosis Skin , left forearm - posterior SQUAMOUS CELL CARCINOMA, KERATOACANTHOMA TYPE  Cancer - SCC Already treated Recheck next visit

## 2023-01-17 LAB — NMR, LIPOPROFILE
Cholesterol, Total: 131 mg/dL (ref 100–199)
HDL Particle Number: 33.2 umol/L
HDL-C: 52 mg/dL
LDL Particle Number: 669 nmol/L
LDL Size: 20.4 nm — ABNORMAL LOW
LDL-C (NIH Calc): 60 mg/dL (ref 0–99)
LP-IR Score: 34
Small LDL Particle Number: 307 nmol/L
Triglycerides: 106 mg/dL (ref 0–149)

## 2023-01-17 LAB — BASIC METABOLIC PANEL WITH GFR
BUN/Creatinine Ratio: 14 (ref 10–24)
BUN: 10 mg/dL (ref 8–27)
CO2: 30 mmol/L — ABNORMAL HIGH (ref 20–29)
Calcium: 8.5 mg/dL — ABNORMAL LOW (ref 8.6–10.2)
Chloride: 103 mmol/L (ref 96–106)
Creatinine, Ser: 0.74 mg/dL — ABNORMAL LOW (ref 0.76–1.27)
Glucose: 111 mg/dL — ABNORMAL HIGH (ref 70–99)
Potassium: 3.6 mmol/L (ref 3.5–5.2)
Sodium: 146 mmol/L — ABNORMAL HIGH (ref 134–144)
eGFR: 94 mL/min/{1.73_m2}

## 2023-01-17 LAB — PSA: Prostate Specific Ag, Serum: <0.1 ng/mL (ref 0.0–4.0)

## 2023-01-17 LAB — CBC
Hematocrit: 34.2 % — ABNORMAL LOW (ref 37.5–51.0)
Hemoglobin: 11.1 g/dL — ABNORMAL LOW (ref 13.0–17.7)
MCH: 26.1 pg — ABNORMAL LOW (ref 26.6–33.0)
MCHC: 32.5 g/dL (ref 31.5–35.7)
MCV: 81 fL (ref 79–97)
Platelets: 211 10*3/uL (ref 150–450)
RBC: 4.25 x10E6/uL (ref 4.14–5.80)
RDW: 13.5 % (ref 11.6–15.4)
WBC: 5 10*3/uL (ref 3.4–10.8)

## 2023-01-17 LAB — HEMOGLOBIN A1C
Est. average glucose Bld gHb Est-mCnc: 131 mg/dL
Hgb A1c MFr Bld: 6.2 % — ABNORMAL HIGH (ref 4.8–5.6)

## 2023-01-18 ENCOUNTER — Encounter: Payer: Self-pay | Admitting: Dermatology

## 2023-01-18 ENCOUNTER — Encounter: Payer: Self-pay | Admitting: Infectious Diseases

## 2023-01-19 ENCOUNTER — Ambulatory Visit
Admission: RE | Admit: 2023-01-19 | Discharge: 2023-01-19 | Disposition: A | Discharge status: Home / Self Care | Payer: Medicare Other | Source: Ambulatory Visit | Attending: Infectious Diseases | Admitting: Infectious Diseases

## 2023-01-19 ENCOUNTER — Other Ambulatory Visit: Payer: Self-pay | Admitting: Infectious Diseases

## 2023-01-19 ENCOUNTER — Ambulatory Visit
Admission: RE | Admit: 2023-01-19 | Discharge: 2023-01-19 | Disposition: A | Discharge status: Home / Self Care | Payer: Medicare Other | Attending: Infectious Diseases | Admitting: Infectious Diseases

## 2023-01-19 DIAGNOSIS — M5134 Other intervertebral disc degeneration, thoracic region: Secondary | ICD-10-CM | POA: Diagnosis not present

## 2023-01-19 DIAGNOSIS — T847XXS Infection and inflammatory reaction due to other internal orthopedic prosthetic devices, implants and grafts, sequela: Secondary | ICD-10-CM

## 2023-01-19 DIAGNOSIS — M4312 Spondylolisthesis, cervical region: Secondary | ICD-10-CM | POA: Diagnosis not present

## 2023-01-19 DIAGNOSIS — M47812 Spondylosis without myelopathy or radiculopathy, cervical region: Secondary | ICD-10-CM | POA: Diagnosis not present

## 2023-01-19 DIAGNOSIS — M5136 Other intervertebral disc degeneration, lumbar region: Secondary | ICD-10-CM | POA: Diagnosis not present

## 2023-01-19 DIAGNOSIS — M47816 Spondylosis without myelopathy or radiculopathy, lumbar region: Secondary | ICD-10-CM | POA: Diagnosis not present

## 2023-01-19 DIAGNOSIS — M8588 Other specified disorders of bone density and structure, other site: Secondary | ICD-10-CM | POA: Diagnosis not present

## 2023-01-30 ENCOUNTER — Other Ambulatory Visit: Payer: Self-pay | Admitting: Internal Medicine

## 2023-02-02 ENCOUNTER — Other Ambulatory Visit: Payer: Self-pay | Admitting: *Deleted

## 2023-02-02 DIAGNOSIS — M79605 Pain in left leg: Secondary | ICD-10-CM

## 2023-02-02 DIAGNOSIS — M79604 Pain in right leg: Secondary | ICD-10-CM

## 2023-02-13 DIAGNOSIS — M542 Cervicalgia: Secondary | ICD-10-CM | POA: Diagnosis not present

## 2023-02-13 DIAGNOSIS — M25579 Pain in unspecified ankle and joints of unspecified foot: Secondary | ICD-10-CM | POA: Diagnosis not present

## 2023-02-13 DIAGNOSIS — M48061 Spinal stenosis, lumbar region without neurogenic claudication: Secondary | ICD-10-CM | POA: Diagnosis not present

## 2023-02-13 DIAGNOSIS — Z79899 Other long term (current) drug therapy: Secondary | ICD-10-CM | POA: Diagnosis not present

## 2023-02-13 DIAGNOSIS — K5909 Other constipation: Secondary | ICD-10-CM | POA: Diagnosis not present

## 2023-02-13 DIAGNOSIS — M961 Postlaminectomy syndrome, not elsewhere classified: Secondary | ICD-10-CM | POA: Diagnosis not present

## 2023-02-13 DIAGNOSIS — M545 Low back pain, unspecified: Secondary | ICD-10-CM | POA: Diagnosis not present

## 2023-02-13 DIAGNOSIS — M47812 Spondylosis without myelopathy or radiculopathy, cervical region: Secondary | ICD-10-CM | POA: Diagnosis not present

## 2023-02-17 DIAGNOSIS — T847XXD Infection and inflammatory reaction due to other internal orthopedic prosthetic devices, implants and grafts, subsequent encounter: Secondary | ICD-10-CM | POA: Diagnosis not present

## 2023-02-17 DIAGNOSIS — Z792 Long term (current) use of antibiotics: Secondary | ICD-10-CM | POA: Diagnosis not present

## 2023-02-17 DIAGNOSIS — I5032 Chronic diastolic (congestive) heart failure: Secondary | ICD-10-CM | POA: Diagnosis not present

## 2023-02-17 DIAGNOSIS — E785 Hyperlipidemia, unspecified: Secondary | ICD-10-CM | POA: Diagnosis not present

## 2023-02-17 DIAGNOSIS — Z87891 Personal history of nicotine dependence: Secondary | ICD-10-CM | POA: Diagnosis not present

## 2023-02-17 DIAGNOSIS — I2510 Atherosclerotic heart disease of native coronary artery without angina pectoris: Secondary | ICD-10-CM | POA: Diagnosis not present

## 2023-02-17 DIAGNOSIS — K219 Gastro-esophageal reflux disease without esophagitis: Secondary | ICD-10-CM | POA: Diagnosis not present

## 2023-02-17 DIAGNOSIS — F32A Depression, unspecified: Secondary | ICD-10-CM | POA: Diagnosis not present

## 2023-02-17 DIAGNOSIS — I110 Hypertensive heart disease with heart failure: Secondary | ICD-10-CM | POA: Diagnosis not present

## 2023-02-17 DIAGNOSIS — A4901 Methicillin susceptible Staphylococcus aureus infection, unspecified site: Secondary | ICD-10-CM | POA: Diagnosis not present

## 2023-02-20 ENCOUNTER — Other Ambulatory Visit: Payer: Self-pay | Admitting: Infectious Diseases

## 2023-02-20 DIAGNOSIS — T847XXD Infection and inflammatory reaction due to other internal orthopedic prosthetic devices, implants and grafts, subsequent encounter: Secondary | ICD-10-CM

## 2023-02-21 ENCOUNTER — Ambulatory Visit (HOSPITAL_COMMUNITY): Payer: Medicare Other

## 2023-02-21 ENCOUNTER — Encounter: Payer: Medicare Other | Admitting: Vascular Surgery

## 2023-02-22 ENCOUNTER — Ambulatory Visit: Payer: Medicare Other | Admitting: Family Medicine

## 2023-02-23 ENCOUNTER — Ambulatory Visit
Admission: RE | Admit: 2023-02-23 | Discharge: 2023-02-23 | Disposition: A | Discharge status: Home / Self Care | Payer: Medicare Other | Source: Ambulatory Visit | Attending: Infectious Diseases | Admitting: Infectious Diseases

## 2023-02-23 DIAGNOSIS — T847XXD Infection and inflammatory reaction due to other internal orthopedic prosthetic devices, implants and grafts, subsequent encounter: Secondary | ICD-10-CM | POA: Diagnosis present

## 2023-02-23 DIAGNOSIS — M8588 Other specified disorders of bone density and structure, other site: Secondary | ICD-10-CM | POA: Diagnosis not present

## 2023-02-23 DIAGNOSIS — M4126 Other idiopathic scoliosis, lumbar region: Secondary | ICD-10-CM | POA: Diagnosis not present

## 2023-02-23 MED ORDER — IOHEXOL 300 MG/ML  SOLN
100.0000 mL | Freq: Once | INTRAMUSCULAR | Status: AC | PRN
  Administered 2023-02-23: 100 mL via INTRAVENOUS

## 2023-03-01 DIAGNOSIS — M47812 Spondylosis without myelopathy or radiculopathy, cervical region: Secondary | ICD-10-CM | POA: Diagnosis not present

## 2023-03-01 DIAGNOSIS — G894 Chronic pain syndrome: Secondary | ICD-10-CM | POA: Diagnosis not present

## 2023-03-01 DIAGNOSIS — M48061 Spinal stenosis, lumbar region without neurogenic claudication: Secondary | ICD-10-CM | POA: Diagnosis not present

## 2023-03-01 DIAGNOSIS — K5909 Other constipation: Secondary | ICD-10-CM | POA: Diagnosis not present

## 2023-03-01 DIAGNOSIS — Z79899 Other long term (current) drug therapy: Secondary | ICD-10-CM | POA: Diagnosis not present

## 2023-04-27 ENCOUNTER — Other Ambulatory Visit: Payer: Self-pay | Admitting: Physician Assistant

## 2023-04-27 ENCOUNTER — Other Ambulatory Visit: Payer: Self-pay | Admitting: Gastroenterology

## 2023-04-27 DIAGNOSIS — M545 Low back pain, unspecified: Secondary | ICD-10-CM

## 2023-04-27 DIAGNOSIS — M48061 Spinal stenosis, lumbar region without neurogenic claudication: Secondary | ICD-10-CM | POA: Diagnosis not present

## 2023-04-27 DIAGNOSIS — Z792 Long term (current) use of antibiotics: Secondary | ICD-10-CM | POA: Diagnosis not present

## 2023-04-27 DIAGNOSIS — M4326 Fusion of spine, lumbar region: Secondary | ICD-10-CM | POA: Diagnosis not present

## 2023-04-27 DIAGNOSIS — T8149XA Infection following a procedure, other surgical site, initial encounter: Secondary | ICD-10-CM

## 2023-04-27 DIAGNOSIS — T148XXA Other injury of unspecified body region, initial encounter: Secondary | ICD-10-CM

## 2023-04-27 DIAGNOSIS — R5082 Postprocedural fever: Secondary | ICD-10-CM | POA: Diagnosis not present

## 2023-04-27 DIAGNOSIS — Z9889 Other specified postprocedural states: Secondary | ICD-10-CM | POA: Diagnosis not present

## 2023-04-27 DIAGNOSIS — Z87891 Personal history of nicotine dependence: Secondary | ICD-10-CM | POA: Diagnosis not present

## 2023-04-27 DIAGNOSIS — I700 Atherosclerosis of aorta: Secondary | ICD-10-CM | POA: Diagnosis not present

## 2023-04-27 DIAGNOSIS — Z5986 Financial insecurity: Secondary | ICD-10-CM | POA: Diagnosis not present

## 2023-05-02 ENCOUNTER — Ambulatory Visit: Payer: Medicare Other | Admitting: Vascular Surgery

## 2023-05-02 ENCOUNTER — Ambulatory Visit (HOSPITAL_COMMUNITY)
Admission: RE | Admit: 2023-05-02 | Discharge: 2023-05-02 | Disposition: A | Discharge status: Home / Self Care | Payer: Medicare Other | Source: Ambulatory Visit | Attending: Vascular Surgery | Admitting: Vascular Surgery

## 2023-05-02 ENCOUNTER — Encounter: Payer: Self-pay | Admitting: Vascular Surgery

## 2023-05-02 VITALS — BP 146/68 | HR 60 | Temp 97.7°F | Resp 16 | Ht 70.0 in | Wt 188.0 lb

## 2023-05-02 DIAGNOSIS — I872 Venous insufficiency (chronic) (peripheral): Secondary | ICD-10-CM | POA: Diagnosis not present

## 2023-05-02 DIAGNOSIS — M79605 Pain in left leg: Secondary | ICD-10-CM

## 2023-05-02 DIAGNOSIS — M79604 Pain in right leg: Secondary | ICD-10-CM

## 2023-05-02 NOTE — Progress Notes (Signed)
Patient name: Albert Wade [name]: [id number] DOB: Jun 15, 1946 Sex: male  REASON FOR CONSULT: Varicose veins  HPI: Albert Wade is a 77 y.o. male, with multiple comorbidities including coronary artery disease, COPD, hypertension, hyperlipidemia, obstructive sleep apnea that presents for evaluation of varicose veins.  His most prominent varicosities are in the right leg.  He states these have been present for 20 or 30 years.  It really does not bother him.  He has minimal leg swelling.  His PCP recommended that he get this evaluated.  Does have compression stockings that he wore for 7-8 years.  He has been doing conservative therapy.  Wife states he is going to [facility] to see infectious disease for spine infection after surgery in [place].  Past Medical History:  Diagnosis Date   Acute bronchitis 01/01/2015   Acute upper respiratory infection 05/19/2016   Anginal pain (HCC)    Anxiety    Arthritis    back,wrists,hx. previous fractures   BPH (benign prostatic hypertrophy)    BRONCHITIS, CHRONIC 04/29/2008   Qualifier: Diagnosis of  By: Craige Cotta MD, Vineet     CAD 10/16/2009   Qualifier: Diagnosis of  By: Oswald Hillock     CAD (coronary artery disease)    angioplasty    Chronic back pain    Chronic bronchitis    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/2016   Confusion 04/27/2017   COPD (chronic obstructive pulmonary disease) (HCC)    Depression    Encounter for preventative adult health care exam with abnormal findings 12/20/2013   Fever 04/27/2017   GERD (gastroesophageal reflux disease)    Gynecomastia 05/12/2015   H/O hiatal hernia    pts wife states pt does not have    Heart murmur    History of kidney stones    HTN (hypertension)    Hypercholesteremia    Hyperglycemia 05/19/2016   HYPERLIPIDEMIA 04/29/2008   Qualifier: Diagnosis of  By: Marinus Maw     Impaired glucose tolerance 12/20/2013   Insomnia 04/27/2017   Lumbosacral radiculopathy 10/14/2016   MI  (myocardial infarction) [facility])    mid-'90's   MYOCARDIAL INFARCTION 04/29/2008   Qualifier: History of  By: Marinus Maw     OSA (obstructive sleep apnea) 05/13/2011   Split night study 2011:  AHI 21/hr with obstructive and central events.  Placed on cpap and titrated to 13cm with reasonable control, but attempts to increase pressure increased the number of central events.  Medium resmed quattro full face mask used.     Peripheral neuropathy    peripheral neuropathy   Phimosis/adherent prepuce 10/21/2013   Pneumonia    Prostate cancer (HCC)    Shortness of breath 11/05/2010   Qualifier: Diagnosis of  By: Annamary Rummage, RN, BSN, Jacquelyn     Sleep apnea    mild-no cpap, unable to tolerate   Squamous cell carcinoma of skin 10/29/2020   right mid volar forearm   Squamous cell carcinoma of skin 01/09/2023   Left forearm - [facility]   Umbilical hernia    Umbilical hernia without obstruction and without gangrene 05/19/2016   URI (upper respiratory infection) 04/27/2017   UTI (urinary tract infection) 12/01/2016   Varicose veins    Varicose veins of bilateral lower extremities with other complications 10/14/2016   Wheezing 01/01/2015    Past Surgical History:  Procedure Laterality Date   AMPUTATION TOE Right 04/01/2022   Procedure: AMPUTATION TOE-Right Great Toe Amputation;  Surgeon: Edwin Cap, DPM;  Location: [facility]  ORS;  Service: Podiatry;  Laterality: Right;   ANGIOPLASTY     APPENDECTOMY     ARTERY BIOPSY Right 10/15/2021   Procedure: BIOPSY TEMPORAL ARTERY;  Surgeon: Sung Amabile, DO;  Location: [facility] ORS;  Service: General;  Laterality: Right;   BACK SURGERY     x3   CARDIAC CATHETERIZATION     11'11   CIRCUMCISION N/A 10/21/2013   Procedure: CIRCUMCISION ADULT;  Surgeon: Valetta Fuller, MD;  Location: [facility] ORS;  Service: Urology;  Laterality: N/A;   COLONOSCOPY WITH PROPOFOL N/A 06/09/2021   Procedure: COLONOSCOPY WITH PROPOFOL;  Surgeon: Wyline Mood, MD;  Location: [facility]  ENDOSCOPY;  Service: Gastroenterology;  Laterality: N/A;   CYSTOSCOPY N/A 10/21/2013   Procedure: CYSTOSCOPY FLEXIBLE;  Surgeon: Valetta Fuller, MD;  Location: [facility] ORS;  Service: Urology;  Laterality: N/A;   CYSTOSCOPY W/ URETERAL STENT PLACEMENT Right 06/01/2020   Procedure: CYSTOSCOPY WITH RETROGRADE PYELOGRAM/URETERAL STENT Exchange;  Surgeon: Vanna Scotland, MD;  Location: [facility] ORS;  Service: Urology;  Laterality: Right;   CYSTOSCOPY WITH BIOPSY Right 01/06/2020   Procedure: CYSTOSCOPY WITH BIOPSY;  Surgeon: Vanna Scotland, MD;  Location: [facility] ORS;  Service: Urology;  Laterality: Right;   CYSTOSCOPY/URETEROSCOPY/HOLMIUM LASER/STENT PLACEMENT Right 01/06/2020   Procedure: CYSTOSCOPY/URETEROSCOPY/HOLMIUM LASER/STENT PLACEMENT;  Surgeon: Vanna Scotland, MD;  Location: [facility] ORS;  Service: Urology;  Laterality: Right;   ESOPHAGOGASTRODUODENOSCOPY N/A 02/03/2021   Procedure: ESOPHAGOGASTRODUODENOSCOPY (EGD);  Surgeon: Wyline Mood, MD;  Location[facility] ENDOSCOPY;  Service: Gastroenterology;  Laterality: N/A;   ESOPHAGOGASTRODUODENOSCOPY (EGD) WITH PROPOFOL N/A 03/09/2021   Procedure: ESOPHAGOGASTRODUODENOSCOPY (EGD) WITH PROPOFOL;  Surgeon: Wyline Mood, MD;  Location: [facility] ENDOSCOPY;  Service: Gastroenterology;  Laterality: N/A;   HERNIA REPAIR     INSERTION OF MESH N/A 06/17/2016   Procedure: INSERTION OF MESH;  Surgeon: Glenna Fellows, MD;  Location: [facility] ORS;  Service: General;  Laterality: N/A;   UMBILICAL HERNIA REPAIR N/A 06/17/2016   Procedure: REPAIR UMBILICAL HERNIA WITH MESH;  Surgeon: Glenna Fellows, MD;  Location: [facility] ORS;  Service: General;  Laterality: N/A;   WRIST SURGERY Right     Family History  Problem Relation Age of Onset   Prostate cancer Father    Emphysema Father    Hyperlipidemia Father    Hyperlipidemia Mother     SOCIAL HISTORY: Social History   Socioeconomic History   Marital status: Married    Spouse name: Not on file   Number of children: Not on file   Years  of education: Not on file   Highest education level: Not on file  Occupational History   Occupation: retired Naval architect  Tobacco Use   Smoking status: Former    Packs/day: 0.50    Years: 40.00    Additional pack years: 0.00    Total pack years: 20.00    Types: Cigarettes    Quit date: 12/27/1995    Years since quitting: 27.3   Smokeless tobacco: Never  Vaping Use   Vaping Use: Never used  Substance and Sexual Activity   Alcohol use: No   Drug use: No   Sexual activity: Yes  Other Topics Concern   Not on file  Social History Narrative   Not on file   Social Determinants of Health   Financial Resource Strain: Not on file  Food Insecurity: No Food Insecurity (04/14/2022)   Hunger Vital Sign    Worried About Running Out of Food in the Last Year: Never true    Ran Out of Food in the Last  Year: Never true  Transportation Needs: No Transportation Needs (04/14/2022)   PRAPARE - Administrator, Civil Service (Medical): No    Lack of Transportation (Non-Medical): No  Physical Activity: Not on file  Stress: Not on file  Social Connections: Not on file  Intimate Partner Violence: Not on file    Allergies  Allergen Reactions   Cyclobenzaprine     agitation    Seroquel [Quetiapine] Hives    Current Outpatient Medications  Medication Sig Dispense Refill   albuterol (VENTOLIN HFA) 108 (90 Base) MCG/ACT inhaler INHALE 2 PUFFS INTO THE LUNGS 4 TIMES DAILY AS NEEDED 8.5 g 0   ALPRAZolam (XANAX) 0.5 MG tablet Take 0.5 mg by mouth 2 (two) times daily as needed for anxiety.     amLODipine (NORVASC) 2.5 MG tablet TAKE 1 TABLET BY MOUTH DAILY 90 tablet 2   aspirin 81 MG tablet Take 81 mg by mouth daily.     benzonatate (TESSALON) 200 MG capsule Take 1 capsule (200 mg total) by mouth 2 (two) times daily as needed for cough. 20 capsule 0   blood glucose meter kit and supplies KIT Dispense based on patient and insurance preference. Use up to four times daily as directed. 1 each  0   cephALEXin (KEFLEX) 500 MG capsule Take 3,000 mg by mouth daily.     DULoxetine (CYMBALTA) 60 MG capsule Take 60 mg by mouth 2 (two) times daily.      furosemide (LASIX) 40 MG tablet TAKE 1 TABLET BY MOUTH TWICE A DAY 60 tablet 11   oxyCODONE-acetaminophen (PERCOCET) 10-325 MG tablet Take 2 tablets by mouth every 4 (four) hours as needed for pain.     simvastatin (ZOCOR) 40 MG tablet TAKE 1 TABLET BY MOUTH AT BEDTIME 90 tablet 2   traZODone (DESYREL) 50 MG tablet Take 1 tablet (50 mg total) by mouth at bedtime as needed for sleep. 30 tablet 0   omeprazole (PRILOSEC) 20 MG capsule TAKE ONE CAPSULE BY MOUTH TWICE A DAY 180 capsule 3   No current facility-administered medications for this visit.    REVIEW OF SYSTEMS:  [X]  denotes positive finding, [ ]  denotes negative finding Cardiac  Comments:  Chest pain or chest pressure:    Shortness of breath upon exertion:    Short of breath when lying flat:    Irregular heart rhythm:        Vascular    Pain in calf, thigh, or hip brought on by ambulation:    Pain in feet at night that wakes you up from your sleep:     Blood clot in your veins:    Leg swelling:         Pulmonary    Oxygen at home:    Productive cough:     Wheezing:         Neurologic    Sudden weakness in arms or legs:     Sudden numbness in arms or legs:     Sudden onset of difficulty speaking or slurred speech:    Temporary loss of vision in one eye:     Problems with dizziness:         Gastrointestinal    Blood in stool:     Vomited blood:         Genitourinary    Burning when urinating:     Blood in urine:        Psychiatric    Major depression:  Hematologic    Bleeding problems:    Problems with blood clotting too easily:        Skin    Rashes or ulcers:        Constitutional    Fever or chills:      PHYSICAL EXAM: Vitals:   05/02/23 1145  BP: (!) 146/68  Pulse: 60  Resp: 16  Temp: 97.7 F (36.5 C)  TempSrc: Temporal  SpO2: 97%   Weight: 188 lb (85.3 kg)  Height: 5\' 10"  (1.778 m)    GENERAL: The patient is a well-nourished male, in no acute distress. The vital signs are documented above. CARDIAC: There is a regular rate and rhythm.  VASCULAR:  Bilateral DP pulses palpable Right great toe previously amputated and healed Large right leg varicosity as pictured. PULMONARY: No respiratory distress. ABDOMEN: Soft and non-tender. MUSCULOSKELETAL: There are no major deformities or cyanosis. NEUROLOGIC: No focal weakness or paresthesias are detected. PSYCHIATRIC: The patient has a normal affect.    DATA:     Lower Venous Reflux Study  Patient Name:  Albert Wade  Date of Exam:   05/02/2023 Medical Rec #: [id number]         Accession #:    [id number] Date of Birth: 1946-10-21          Patient Gender: M Patient Age:   77 years Exam Location:  [facility] Procedure:      VAS [place] LOWER EXTREMITY VENOUS REFLUX Referring Phys: Heath Lark   --------------------------------------------------------------------------- -----   Indications: Pain, and Edema.   Comparison Study: no prior  Performing Technologist: [place] Ponder RVS    Examination Guidelines: A complete evaluation includes B-mode imaging, spectral Doppler, color Doppler, and power Doppler as needed of all accessible portions of each vessel. Bilateral testing is considered an integral part of a complete examination. Limited examinations for reoccurring indications may be performed as noted. The reflux portion of the exam is performed with the patient in reverse Trendelenburg. Significant venous reflux is defined as >500 ms in the superficial venous system, and >1 second in the deep venous system.    Venous Reflux Times +--------------+---------+------+-----------+------------+--------+ RIGHT         Reflux NoRefluxReflux TimeDiameter cmsComments                         Yes                                   +--------------+---------+------+-----------+------------+--------+ CFV                     yes   >1 second                      +--------------+---------+------+-----------+------------+--------+ FV mid        no                                             +--------------+---------+------+-----------+------------+--------+ Popliteal     no                                             +--------------+---------+------+-----------+------------+--------+ GSV at [facility]  no                            .622             +--------------+---------+------+-----------+------------+--------+ GSV prox thighno                            .403             +--------------+---------+------+-----------+------------+--------+ GSV mid thigh no                            .348             +--------------+---------+------+-----------+------------+--------+ GSV dist thighno                            .309             +--------------+---------+------+-----------+------------+--------+ GSV at knee             yes    >500 ms      .286             +--------------+---------+------+-----------+------------+--------+ GSV prox calf no                            .183             +--------------+---------+------+-----------+------------+--------+ GSV mid calf  no                            .178             +--------------+---------+------+-----------+------------+--------+ SSV Pop Fossa no                            .165             +--------------+---------+------+-----------+------------+--------+ SSV prox calf no                            .151             +--------------+---------+------+-----------+------------+--------+ SSV mid calf  no                            .142             +--------------+---------+------+-----------+------------+--------+ AASV          no                            .294              +--------------+---------+------+-----------+------------+--------+        Summary: Right: - No evidence of deep vein thrombosis seen in the right lower extremity, from the common femoral through the popliteal veins. - No evidence of superficial venous thrombosis in the right lower extremity. - Venous reflux is noted in the right common femoral vein. - Venous reflux is noted in the GSV at the knee.   *See table(s) above for measurements and observations.    Assessment/Plan:  77 year old male with evidence of chronic venous insufficiency with large varicosity.  On exam he has a very prominent varicosity in the right leg as pictured above.  Reflux study shows reflux in the common femoral vein as well as the great saphenous vein at the knee with evidence of both superficial and deep venous reflux.  I discussed the option of likely stab phlebectomy to address his large varicosity.  Fortunately he is having no discomfort or pain.  I do not think there is any indication for intervention if this does not bother him.  We again talked about compression stockings, elevation, exercise and weight loss for conservative therapy.  He can follow-up with me as needed.   Cephus Shelling, MD [facility] Office: [phone]

## 2023-05-05 ENCOUNTER — Ambulatory Visit
Admission: RE | Admit: 2023-05-05 | Discharge: 2023-05-05 | Disposition: A | Discharge status: Home / Self Care | Payer: Medicare Other | Source: Ambulatory Visit | Attending: Physician Assistant | Admitting: Physician Assistant

## 2023-05-05 DIAGNOSIS — T8149XA Infection following a procedure, other surgical site, initial encounter: Secondary | ICD-10-CM | POA: Insufficient documentation

## 2023-05-05 DIAGNOSIS — M4326 Fusion of spine, lumbar region: Secondary | ICD-10-CM

## 2023-05-05 DIAGNOSIS — T148XXA Other injury of unspecified body region, initial encounter: Secondary | ICD-10-CM

## 2023-05-05 DIAGNOSIS — M545 Low back pain, unspecified: Secondary | ICD-10-CM

## 2023-05-05 MED ORDER — GADOBUTROL 1 MMOL/ML IV SOLN
7.5000 mL | Freq: Once | INTRAVENOUS | Status: AC | PRN
  Administered 2023-05-05: 7.5 mL via INTRAVENOUS

## 2023-05-10 ENCOUNTER — Encounter: Payer: Self-pay | Admitting: Emergency Medicine

## 2023-05-10 ENCOUNTER — Other Ambulatory Visit: Payer: Self-pay

## 2023-05-10 ENCOUNTER — Emergency Department
Admission: EM | Admit: 2023-05-10 | Discharge: 2023-05-10 | Disposition: A | Discharge status: Home / Self Care | Payer: Medicare Other | Attending: Emergency Medicine | Admitting: Emergency Medicine

## 2023-05-10 ENCOUNTER — Emergency Department: Payer: Medicare Other

## 2023-05-10 DIAGNOSIS — X501XXA Overexertion from prolonged static or awkward postures, initial encounter: Secondary | ICD-10-CM | POA: Insufficient documentation

## 2023-05-10 DIAGNOSIS — S63501A Unspecified sprain of right wrist, initial encounter: Secondary | ICD-10-CM | POA: Insufficient documentation

## 2023-05-10 DIAGNOSIS — S638X1A Sprain of other part of right wrist and hand, initial encounter: Secondary | ICD-10-CM | POA: Diagnosis not present

## 2023-05-10 DIAGNOSIS — S6991XA Unspecified injury of right wrist, hand and finger(s), initial encounter: Secondary | ICD-10-CM | POA: Diagnosis present

## 2023-05-10 DIAGNOSIS — M19031 Primary osteoarthritis, right wrist: Secondary | ICD-10-CM | POA: Diagnosis not present

## 2023-05-10 MED ORDER — KETOROLAC TROMETHAMINE 30 MG/ML IJ SOLN
15.0000 mg | Freq: Once | INTRAMUSCULAR | Status: AC
  Administered 2023-05-10: 15 mg via INTRAMUSCULAR
  Filled 2023-05-10: qty 1

## 2023-05-10 NOTE — ED Provider Notes (Signed)
[facility] Provider Note    Event Date/Time   First MD Initiated Contact with Patient 05/10/23 1046     (approximate)   History   No chief complaint on file.   HPI  Albert Wade is a 77 y.o. male presents to the ED today for evaluation of right wrist pain.  Monday evening he was using a post digger to plant some tomatoes and states that the tool  twisted on him and he injured his wrist at that time. He describes his wrist going into extension at the time of injury.  He has a history of back injury and was given Percocet to manage that which he took this morning, and it has not helped his wrist pain.  He has been unable to sleep due to the discomfort caused by this injury.  His wife has been alternating heat and ice over the arm.  He reports that he is not able to move the arm without pain.     Physical Exam   Triage Vital Signs: ED Triage Vitals  Enc Vitals Group     BP 05/10/23 1037 (!) 157/74     Pulse Rate 05/10/23 1037 84     Resp 05/10/23 1037 18     Temp 05/10/23 1037 99.7 F (37.6 C)     Temp Source 05/10/23 1037 Oral     SpO2 05/10/23 1037 98 %     Weight 05/10/23 1033 185 lb (83.9 kg)     Height 05/10/23 1033 5\' 10"  (1.778 m)     Head Circumference --      Peak Flow --      Pain Score 05/10/23 1033 10     Pain Loc --      Pain Edu? --      Excl. in GC? --     Most recent vital signs: Vitals:   05/10/23 1037  BP: (!) 157/74  Pulse: 84  Resp: 18  Temp: 99.7 F (37.6 C)  SpO2: 98%     General: Awake, mild distress.  CV:  Good peripheral perfusion.  Resp:  Normal effort.  Abd:  No distention.  R Arm:  Mild swelling of the right wrist noted when compared with the left wrist.  No bruising present.  Good capillary refill.  Patient unable to complete wrist range of motion due to pain.  Finger limited due to pain.  Unable to flex and extend the elbow due to pain, but no tenderness to palpation of the elbow. Tenderness to  palpation over the distal radius and ulna as well as across carpal bones, including the snuffbox. Sensation intact across all dermatomes.   ED Results / Procedures / Treatments   Labs (all labs ordered are listed, but only abnormal results are displayed) Labs Reviewed - No data to display    RADIOLOGY Right wrist x-rays were obtained in the ER today.  I individually reviewed and interpreted the images, no fractures or dislocations.  I reviewed the radiologist report and I agree.   PROCEDURES:  Critical Care performed: No  Procedures   MEDICATIONS ORDERED IN ED: Medications  ketorolac (TORADOL) 30 MG/ML injection 15 mg (15 mg Intramuscular Given 05/10/23 1131)     IMPRESSION / MDM / ASSESSMENT AND PLAN / ED COURSE  I reviewed the triage vital signs and the nursing notes.  Differential diagnosis includes, but is not limited to, wrist fracture, wrist sprain, muscle strain.  Patient's presentation is most consistent with acute, uncomplicated illness.  Mr. Ting presented to the emergency department today for evaluation of wrist pain after injuring it doing some gardening on Monday night.  I reviewed his chart and he has an extensive medical history including CAD, COPD, chronic back pain.  Wrist x-rays were obtained and did not show evidence of a fracture.  Patient received a Toradol shot to help with pain relief. Patient given a wrist brace and instructed to follow-up with PCP.  Patient was not given any pain medications at discharge as he already has a prescription in for opioids due to his chronic back pain. Patient was stable for discharge.       FINAL CLINICAL IMPRESSION(S) / ED DIAGNOSES   Final diagnoses:  Wrist sprain, right, initial encounter     Rx / DC Orders   ED Discharge Orders     None        Note:  This document was prepared using Dragon voice recognition software and may include unintentional dictation errors.    Cruz Condon, PA-C 05/10/23 1517    Trinna Post, MD 05/10/23 [phone]

## 2023-05-10 NOTE — Discharge Instructions (Signed)
Please wear wrist brace at all times for one week, aside from showering. Keep elevated. You may continue to use ice and heat packs. Resume activity as tolerated.

## 2023-05-10 NOTE — ED Triage Notes (Signed)
Pt via POV from home. Pt here for R wrist and R forearm pain states he was planting and digging a hole and twisted his R wrist. Pt is A&Ox4 and NAD

## 2023-05-11 ENCOUNTER — Other Ambulatory Visit: Payer: Self-pay | Admitting: Internal Medicine

## 2023-05-12 DIAGNOSIS — I5032 Chronic diastolic (congestive) heart failure: Secondary | ICD-10-CM | POA: Diagnosis not present

## 2023-05-12 DIAGNOSIS — F32A Depression, unspecified: Secondary | ICD-10-CM | POA: Diagnosis not present

## 2023-05-12 DIAGNOSIS — K219 Gastro-esophageal reflux disease without esophagitis: Secondary | ICD-10-CM | POA: Diagnosis not present

## 2023-05-12 DIAGNOSIS — E785 Hyperlipidemia, unspecified: Secondary | ICD-10-CM | POA: Diagnosis not present

## 2023-05-12 DIAGNOSIS — R600 Localized edema: Secondary | ICD-10-CM | POA: Diagnosis not present

## 2023-05-12 DIAGNOSIS — I110 Hypertensive heart disease with heart failure: Secondary | ICD-10-CM | POA: Diagnosis not present

## 2023-05-12 DIAGNOSIS — I2510 Atherosclerotic heart disease of native coronary artery without angina pectoris: Secondary | ICD-10-CM | POA: Diagnosis not present

## 2023-05-12 DIAGNOSIS — Z792 Long term (current) use of antibiotics: Secondary | ICD-10-CM | POA: Diagnosis not present

## 2023-05-12 DIAGNOSIS — T847XXD Infection and inflammatory reaction due to other internal orthopedic prosthetic devices, implants and grafts, subsequent encounter: Secondary | ICD-10-CM | POA: Diagnosis not present

## 2023-05-12 DIAGNOSIS — A4901 Methicillin susceptible Staphylococcus aureus infection, unspecified site: Secondary | ICD-10-CM | POA: Diagnosis not present

## 2023-05-12 DIAGNOSIS — Z87891 Personal history of nicotine dependence: Secondary | ICD-10-CM | POA: Diagnosis not present

## 2023-05-13 DIAGNOSIS — M79641 Pain in right hand: Secondary | ICD-10-CM | POA: Diagnosis not present

## 2023-05-13 DIAGNOSIS — M7989 Other specified soft tissue disorders: Secondary | ICD-10-CM | POA: Diagnosis not present

## 2023-05-13 DIAGNOSIS — S6991XA Unspecified injury of right wrist, hand and finger(s), initial encounter: Secondary | ICD-10-CM | POA: Diagnosis not present

## 2023-05-13 DIAGNOSIS — M19041 Primary osteoarthritis, right hand: Secondary | ICD-10-CM | POA: Diagnosis not present

## 2023-05-15 DIAGNOSIS — M79641 Pain in right hand: Secondary | ICD-10-CM | POA: Diagnosis not present

## 2023-05-15 DIAGNOSIS — M7989 Other specified soft tissue disorders: Secondary | ICD-10-CM | POA: Diagnosis not present

## 2023-05-23 DIAGNOSIS — M19031 Primary osteoarthritis, right wrist: Secondary | ICD-10-CM | POA: Diagnosis not present

## 2023-05-26 ENCOUNTER — Other Ambulatory Visit: Payer: Self-pay | Admitting: Internal Medicine

## 2023-05-30 ENCOUNTER — Ambulatory Visit: Payer: Medicare Other | Admitting: Internal Medicine

## 2023-06-09 DIAGNOSIS — T8140XD Infection following a procedure, unspecified, subsequent encounter: Secondary | ICD-10-CM | POA: Diagnosis not present

## 2023-06-09 DIAGNOSIS — M542 Cervicalgia: Secondary | ICD-10-CM | POA: Diagnosis not present

## 2023-06-09 DIAGNOSIS — M48061 Spinal stenosis, lumbar region without neurogenic claudication: Secondary | ICD-10-CM | POA: Diagnosis not present

## 2023-06-09 DIAGNOSIS — K5909 Other constipation: Secondary | ICD-10-CM | POA: Diagnosis not present

## 2023-06-09 DIAGNOSIS — M5416 Radiculopathy, lumbar region: Secondary | ICD-10-CM | POA: Diagnosis not present

## 2023-06-09 DIAGNOSIS — G4709 Other insomnia: Secondary | ICD-10-CM | POA: Diagnosis not present

## 2023-06-09 DIAGNOSIS — M47812 Spondylosis without myelopathy or radiculopathy, cervical region: Secondary | ICD-10-CM | POA: Diagnosis not present

## 2023-06-09 DIAGNOSIS — Z79899 Other long term (current) drug therapy: Secondary | ICD-10-CM | POA: Diagnosis not present

## 2023-06-09 DIAGNOSIS — M25579 Pain in unspecified ankle and joints of unspecified foot: Secondary | ICD-10-CM | POA: Diagnosis not present

## 2023-06-09 DIAGNOSIS — G894 Chronic pain syndrome: Secondary | ICD-10-CM | POA: Diagnosis not present

## 2023-06-23 DIAGNOSIS — A4901 Methicillin susceptible Staphylococcus aureus infection, unspecified site: Secondary | ICD-10-CM | POA: Diagnosis not present

## 2023-06-23 DIAGNOSIS — Z792 Long term (current) use of antibiotics: Secondary | ICD-10-CM | POA: Diagnosis not present

## 2023-06-23 DIAGNOSIS — E876 Hypokalemia: Secondary | ICD-10-CM | POA: Diagnosis not present

## 2023-07-04 DIAGNOSIS — H2513 Age-related nuclear cataract, bilateral: Secondary | ICD-10-CM | POA: Diagnosis not present

## 2023-07-04 DIAGNOSIS — H5213 Myopia, bilateral: Secondary | ICD-10-CM | POA: Diagnosis not present

## 2023-07-04 DIAGNOSIS — H43393 Other vitreous opacities, bilateral: Secondary | ICD-10-CM | POA: Diagnosis not present

## 2023-07-05 ENCOUNTER — Ambulatory Visit (INDEPENDENT_AMBULATORY_CARE_PROVIDER_SITE_OTHER): Payer: Medicare Other | Admitting: Internal Medicine

## 2023-07-05 VITALS — BP 124/68 | HR 63 | Temp 97.9°F | Ht 70.0 in | Wt 168.0 lb

## 2023-07-05 DIAGNOSIS — M545 Low back pain, unspecified: Secondary | ICD-10-CM

## 2023-07-05 DIAGNOSIS — E876 Hypokalemia: Secondary | ICD-10-CM

## 2023-07-05 DIAGNOSIS — I5032 Chronic diastolic (congestive) heart failure: Secondary | ICD-10-CM

## 2023-07-05 DIAGNOSIS — F32A Depression, unspecified: Secondary | ICD-10-CM

## 2023-07-05 DIAGNOSIS — G8929 Other chronic pain: Secondary | ICD-10-CM

## 2023-07-05 DIAGNOSIS — E7800 Pure hypercholesterolemia, unspecified: Secondary | ICD-10-CM | POA: Diagnosis not present

## 2023-07-05 DIAGNOSIS — F419 Anxiety disorder, unspecified: Secondary | ICD-10-CM | POA: Diagnosis not present

## 2023-07-05 DIAGNOSIS — E538 Deficiency of other specified B group vitamins: Secondary | ICD-10-CM | POA: Insufficient documentation

## 2023-07-05 DIAGNOSIS — R739 Hyperglycemia, unspecified: Secondary | ICD-10-CM | POA: Diagnosis not present

## 2023-07-05 DIAGNOSIS — Z125 Encounter for screening for malignant neoplasm of prostate: Secondary | ICD-10-CM

## 2023-07-05 DIAGNOSIS — I10 Essential (primary) hypertension: Secondary | ICD-10-CM | POA: Diagnosis not present

## 2023-07-05 DIAGNOSIS — Z0001 Encounter for general adult medical examination with abnormal findings: Secondary | ICD-10-CM | POA: Diagnosis not present

## 2023-07-05 DIAGNOSIS — E559 Vitamin D deficiency, unspecified: Secondary | ICD-10-CM | POA: Diagnosis not present

## 2023-07-05 DIAGNOSIS — J449 Chronic obstructive pulmonary disease, unspecified: Secondary | ICD-10-CM | POA: Diagnosis not present

## 2023-07-05 LAB — HEPATIC FUNCTION PANEL
ALT: 46 U/L (ref 0–53)
AST: 24 U/L (ref 0–37)
Albumin: 3.9 g/dL (ref 3.5–5.2)
Alkaline Phosphatase: 175 U/L — ABNORMAL HIGH (ref 39–117)
Bilirubin, Direct: 0.1 mg/dL (ref 0.0–0.3)
Total Bilirubin: 0.5 mg/dL (ref 0.2–1.2)
Total Protein: 6.4 g/dL (ref 6.0–8.3)

## 2023-07-05 LAB — BASIC METABOLIC PANEL WITH GFR
BUN: 12 mg/dL (ref 6–23)
CO2: 28 meq/L (ref 19–32)
Calcium: 9.5 mg/dL (ref 8.4–10.5)
Chloride: 104 meq/L (ref 96–112)
Creatinine, Ser: 0.92 mg/dL (ref 0.40–1.50)
GFR: 80.32 mL/min
Glucose, Bld: 118 mg/dL — ABNORMAL HIGH (ref 70–99)
Potassium: 5.1 meq/L (ref 3.5–5.1)
Sodium: 143 meq/L (ref 135–145)

## 2023-07-05 LAB — LIPID PANEL
Cholesterol: 134 mg/dL (ref 0–200)
HDL: 44.5 mg/dL
LDL Cholesterol: 70 mg/dL (ref 0–99)
NonHDL: 89.23
Total CHOL/HDL Ratio: 3
Triglycerides: 96 mg/dL (ref 0.0–149.0)
VLDL: 19.2 mg/dL (ref 0.0–40.0)

## 2023-07-05 LAB — URINALYSIS, ROUTINE W REFLEX MICROSCOPIC
Hgb urine dipstick: NEGATIVE
Leukocytes,Ua: NEGATIVE
Nitrite: NEGATIVE
Specific Gravity, Urine: >=1.03 — AB (ref 1.000–1.030)
Total Protein, Urine: NEGATIVE
Urine Glucose: NEGATIVE
Urobilinogen, UA: 0.2 (ref 0.0–1.0)
pH: 6 (ref 5.0–8.0)

## 2023-07-05 LAB — VITAMIN B12: Vitamin B-12: 203 pg/mL — ABNORMAL LOW (ref 211–911)

## 2023-07-05 LAB — CBC WITH DIFFERENTIAL/PLATELET
Basophils Absolute: 0 10*3/uL (ref 0.0–0.1)
Basophils Relative: 0.7 % (ref 0.0–3.0)
Eosinophils Absolute: 0.1 10*3/uL (ref 0.0–0.7)
Eosinophils Relative: 1.3 % (ref 0.0–5.0)
HCT: 38 % — ABNORMAL LOW (ref 39.0–52.0)
Hemoglobin: 12 g/dL — ABNORMAL LOW (ref 13.0–17.0)
Lymphocytes Relative: 18.5 % (ref 12.0–46.0)
Lymphs Abs: 1.1 10*3/uL (ref 0.7–4.0)
MCHC: 31.6 g/dL (ref 30.0–36.0)
MCV: 82.2 fl (ref 78.0–100.0)
Monocytes Absolute: 0.6 10*3/uL (ref 0.1–1.0)
Monocytes Relative: 10 % (ref 3.0–12.0)
Neutro Abs: 4.1 10*3/uL (ref 1.4–7.7)
Neutrophils Relative %: 69.5 % (ref 43.0–77.0)
Platelets: 175 10*3/uL (ref 150.0–400.0)
RBC: 4.62 Mil/uL (ref 4.22–5.81)
RDW: 16.9 % — ABNORMAL HIGH (ref 11.5–15.5)
WBC: 6 10*3/uL (ref 4.0–10.5)

## 2023-07-05 LAB — TSH: TSH: 3.06 u[IU]/mL (ref 0.35–5.50)

## 2023-07-05 LAB — MICROALBUMIN / CREATININE URINE RATIO
Creatinine,U: 163.5 mg/dL
Microalb Creat Ratio: 0.8 mg/g (ref 0.0–30.0)
Microalb, Ur: 1.4 mg/dL (ref 0.0–1.9)

## 2023-07-05 LAB — PSA: PSA: 0.03 ng/mL — ABNORMAL LOW (ref 0.10–4.00)

## 2023-07-05 LAB — HEMOGLOBIN A1C: Hgb A1c MFr Bld: 6.4 % (ref 4.6–6.5)

## 2023-07-05 LAB — VITAMIN D 25 HYDROXY (VIT D DEFICIENCY, FRACTURES): VITD: 40.39 ng/mL (ref 30.00–100.00)

## 2023-07-05 MED ORDER — OXYCODONE HCL 30 MG PO TABS: ORAL_TABLET | ORAL | 0 refills | Status: DC

## 2023-07-05 MED ORDER — CITALOPRAM HYDROBROMIDE 10 MG PO TABS: 10.0000 mg | ORAL_TABLET | Freq: Every day | ORAL | 3 refills | Status: DC

## 2023-07-05 MED ORDER — POTASSIUM CHLORIDE ER 10 MEQ PO TBCR: EXTENDED_RELEASE_TABLET | ORAL | 3 refills | Status: DC

## 2023-07-05 MED ORDER — ALBUTEROL SULFATE HFA 108 (90 BASE) MCG/ACT IN AERS: INHALATION_SPRAY | RESPIRATORY_TRACT | 11 refills | Status: DC

## 2023-07-05 MED ORDER — FUROSEMIDE 40 MG PO TABS: ORAL_TABLET | ORAL | 3 refills | Status: DC

## 2023-07-05 NOTE — Progress Notes (Signed)
Chief Complaint:: wellness exam and Medication Refill   After lost to f/u since July 2022; with hx of intervening lumbar infection and drainage with MSSA, chf, recent hypokalemia, copd, anxiety depression       HPI:  Albert Wade is a 77 y.o. male here for wellness exam; declines covid booster, for tdap and shingrix at pharmacy, o/w up to date                        Also seen recently June 28 with MSSA chronic infection with restarting keflex and MR ordered, found to have K 2.7, started kdur 20 every day for 30 days and asked to f/u here.  Pt denies chest pain, increased sob or doe, wheezing, orthopnea, PND, palpitations, dizziness or syncope, but has has mild leg swelling and concerned about not taking lasix as before.  Also asks for albuterol hfa prn restart.  .   Pt denies polydipsia, polyuria, or new focal neuro s/s.    Pt denies fever, wt loss, night sweats, loss of appetite, or other constitutional symptoms  Has had mild worsening depressive symptoms, but no suicidal ideation, or panic; has ongoing anxiety,   Wt Readings from Last 3 Encounters:  07/05/23 168 lb (76.2 kg)  05/10/23 185 lb (83.9 kg)  05/02/23 188 lb (85.3 kg)   BP Readings from Last 3 Encounters:  07/05/23 124/68  05/10/23 (!) 157/74  05/02/23 (!) 146/68   Immunization History  Administered Date(s) Administered   Influenza Split 09/20/2021   Influenza, High Dose Seasonal PF 09/18/2018, 09/05/2019   Influenza, Seasonal, Injecte, Preservative Fre 08/26/2013   Influenza,inj,Quad PF,6+ Mos 09/21/2016   Influenza,inj,quad, With Preservative 10/23/2017   Influenza-Unspecified 10/12/2014, 09/18/2018, 09/17/2020, 09/17/2020   PFIZER(Purple Top)SARS-COV-2 Vaccination 02/16/2020, 03/10/2020   Pneumococcal Conjugate-13 12/20/2013   Pneumococcal Polysaccharide-23 05/12/2015   Td 12/26/2008   Health Maintenance Due  Topic Date Due   Zoster Vaccines- Shingrix (1 of 2) Never done   DTaP/Tdap/Td (2 - Tdap)  12/26/2018   COVID-19 Vaccine (3 - Pfizer risk series) 04/07/2020   Medicare Annual Wellness (AWV)  05/20/2021      Past Medical History:  Diagnosis Date   Acute bronchitis 01/01/2015   Acute upper respiratory infection 05/19/2016   Anginal pain (HCC)    Anxiety    Arthritis    back,wrists,hx. previous fractures   BPH (benign prostatic hypertrophy)    BRONCHITIS, CHRONIC 04/29/2008   Qualifier: Diagnosis of  By: Craige Cotta MD, Vineet     CAD 10/16/2009   Qualifier: Diagnosis of  By: Oswald Hillock     CAD (coronary artery disease)    angioplasty    Chronic back pain    Chronic bronchitis    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/2016   Confusion 04/27/2017   COPD (chronic obstructive pulmonary disease) (HCC)    Depression    Encounter for preventative adult health care exam with abnormal findings 12/20/2013   Fever 04/27/2017   GERD (gastroesophageal reflux disease)    Gynecomastia 05/12/2015   H/O hiatal hernia    pts wife states pt does not have    Heart murmur    History of kidney stones    HTN (hypertension)    Hypercholesteremia    Hyperglycemia 05/19/2016   HYPERLIPIDEMIA 04/29/2008   Qualifier: Diagnosis of  By: Marinus Maw     Impaired glucose tolerance 12/20/2013   Insomnia 04/27/2017   Lumbosacral radiculopathy 10/14/2016  MI (myocardial infarction) (HCC)    mid-'90's   MYOCARDIAL INFARCTION 04/29/2008   Qualifier: History of  By: Marinus Maw     OSA (obstructive sleep apnea) 05/13/2011   Split night study 2011:  AHI 21/hr with obstructive and central events.  Placed on cpap and titrated to 13cm with reasonable control, but attempts to increase pressure increased the number of central events.  Medium resmed quattro full face mask used.     Peripheral neuropathy    peripheral neuropathy   Phimosis/adherent prepuce 10/21/2013   Pneumonia    Prostate cancer (HCC)    Shortness of breath 11/05/2010   Qualifier: Diagnosis of  By: Annamary Rummage,  RN, BSN, Jacquelyn     Sleep apnea    mild-no cpap, unable to tolerate   Squamous cell carcinoma of skin 10/29/2020   right mid volar forearm   Squamous cell carcinoma of skin 01/09/2023   Left forearm - [facility]   Umbilical hernia    Umbilical hernia without obstruction and without gangrene 05/19/2016   URI (upper respiratory infection) 04/27/2017   UTI (urinary tract infection) 12/01/2016   Varicose veins    Varicose veins of bilateral lower extremities with other complications 10/14/2016   Wheezing 01/01/2015   Past Surgical History:  Procedure Laterality Date   AMPUTATION TOE Right 04/01/2022   Procedure: AMPUTATION TOE-Right Great Toe Amputation;  Surgeon: Edwin Cap, DPM;  Location: [facility];  Service: Podiatry;  Laterality: Right;   ANGIOPLASTY     APPENDECTOMY     ARTERY BIOPSY Right 10/15/2021   Procedure: BIOPSY TEMPORAL ARTERY;  Surgeon: Sung Amabile, DO;  Location: [facility];  Service: General;  Laterality: Right;   BACK SURGERY     x3   CARDIAC CATHETERIZATION     11'11   CIRCUMCISION N/A 10/21/2013   Procedure: CIRCUMCISION ADULT;  Surgeon: Valetta Fuller, MD;  Location: [facility];  Service: Urology;  Laterality: N/A;   COLONOSCOPY WITH PROPOFOL N/A 06/09/2021   Procedure: COLONOSCOPY WITH PROPOFOL;  Surgeon: Wyline Mood, MD;  Location: [facility]COPY;  Service: Gastroenterology;  Laterality: N/A;   CYSTOSCOPY N/A 10/21/2013   Procedure: CYSTOSCOPY FLEXIBLE;  Surgeon: Valetta Fuller, MD;  Location: [facility];  Service: Urology;  Laterality: N/A;   CYSTOSCOPY W/ URETERAL STENT PLACEMENT Right 06/01/2020   Procedure: CYSTOSCOPY WITH RETROGRADE PYELOGRAM/URETERAL STENT Exchange;  Surgeon: Vanna Scotland, MD;  Location: [facility];  Service: Urology;  Laterality: Right;   CYSTOSCOPY WITH BIOPSY Right 01/06/2020   Procedure: CYSTOSCOPY WITH BIOPSY;  Surgeon: Vanna Scotland, MD;  Location: [facility];  Service: Urology;  Laterality: Right;   CYSTOSCOPY/URETEROSCOPY/HOLMIUM LASER/STENT  PLACEMENT Right 01/06/2020   Procedure: CYSTOSCOPY/URETEROSCOPY/HOLMIUM LASER/STENT PLACEMENT;  Surgeon: Vanna Scotland, MD;  Location: [facility];  Service: Urology;  Laterality: Right;   ESOPHAGOGASTRODUODENOSCOPY N/A 02/03/2021   Procedure: ESOPHAGOGASTRODUODENOSCOPY (EGD);  Surgeon: Wyline Mood, MD;  Location: [facility] ENDOSCOPY;  Service: Gastroenterology;  Laterality: N/A;   ESOPHAGOGASTRODUODENOSCOPY (EGD) WITH PROPOFOL N/A 03/09/2021   Procedure: ESOPHAGOGASTRODUODENOSCOPY (EGD) WITH PROPOFOL;  Surgeon: Wyline Mood, MD;  Location: [facility] ENDOSCOPY;  Service: Gastroenterology;  Laterality: N/A;   HERNIA REPAIR     INSERTION OF MESH N/A 06/17/2016   Procedure: INSERTION OF MESH;  Surgeon: Glenna Fellows, MD;  Location: [facility];  Service: General;  Laterality: N/A;   UMBILICAL HERNIA REPAIR N/A 06/17/2016   Procedure: REPAIR UMBILICAL HERNIA WITH MESH;  Surgeon: Glenna Fellows, MD;  Location: [facility];  Service: General;  Laterality: N/A;   WRIST SURGERY Right  reports that he quit smoking about 27 years ago. His smoking use included cigarettes. He started smoking about 67 years ago. He has a 20 pack-year smoking history. He has never used smokeless tobacco. He reports that he does not drink alcohol and does not use drugs. family history includes Emphysema in his father; Hyperlipidemia in his father and mother; Prostate cancer in his father. Allergies  Allergen Reactions   Cyclobenzaprine     agitation    Seroquel [Quetiapine] Hives   Current Outpatient Medications on File Prior to Visit  Medication Sig Dispense Refill   amLODipine (NORVASC) 2.5 MG tablet TAKE 1 TABLET BY MOUTH DAILY 90 tablet 2   aspirin 81 MG tablet Take 81 mg by mouth daily.     benzonatate (TESSALON) 200 MG capsule Take 1 capsule (200 mg total) by mouth 2 (two) times daily as needed for cough. 20 capsule 0   blood glucose meter kit and supplies KIT Dispense based on patient and insurance preference. Use up to four times  daily as directed. 1 each 0   cephALEXin (KEFLEX) 500 MG capsule Take 3,000 mg by mouth daily.     DULoxetine (CYMBALTA) 60 MG capsule Take 60 mg by mouth 2 (two) times daily.      omeprazole (PRILOSEC) 20 MG capsule TAKE ONE CAPSULE BY MOUTH TWICE A DAY 180 capsule 3   oxyCODONE-acetaminophen (PERCOCET) 10-325 MG tablet Take 2 tablets by mouth every 4 (four) hours as needed for pain.     simvastatin (ZOCOR) 40 MG tablet TAKE 1 TABLET BY MOUTH AT BEDTIME 90 tablet 2   traZODone (DESYREL) 50 MG tablet Take 1 tablet (50 mg total) by mouth at bedtime as needed for sleep. 30 tablet 0   No current facility-administered medications on file prior to visit.        ROS:  All others reviewed and negative.  Objective        PE:  BP 124/68 (BP Location: Right Arm, Patient Position: Sitting, Cuff Size: Normal)   Pulse 63   Temp 97.9 F (36.6 C) (Oral)   Ht 5\' 10"  (1.778 m)   Wt 168 lb (76.2 kg)   SpO2 99%   BMI 24.11 kg/m                 Constitutional: Pt appears in NAD               HENT: Head: NCAT.                Right Ear: External ear normal.                 Left Ear: External ear normal.                Eyes: . Pupils are equal, round, and reactive to light. Conjunctivae and EOM are normal               Nose: without d/c or deformity               Neck: Neck supple. Gross normal ROM               Cardiovascular: Normal rate and regular rhythm.                 Pulmonary/Chest: Effort normal and breath sounds without rales or wheezing.                Abd:  Soft, NT, ND, + BS, no organomegaly  Neurological: Pt is alert. At baseline orientation, motor grossly intact               Skin: Skin is warm. No rashes, no other new lesions, LE edema - trace bilateral               Psychiatric: Pt behavior is normal without agitation , depressed affect  Micro: none  Cardiac tracings I have personally interpreted today:  none  Pertinent Radiological findings (summarize): none   Lab  Results  Component Value Date   WBC 6.0 07/05/2023   HGB 12.0 (L) 07/05/2023   HCT 38.0 (L) 07/05/2023   PLT 175.0 07/05/2023   GLUCOSE 118 (H) 07/05/2023   CHOL 134 07/05/2023   TRIG 96.0 07/05/2023   HDL 44.50 07/05/2023   LDLDIRECT 78.0 05/19/2016   LDLCALC 70 07/05/2023   ALT 46 07/05/2023   AST 24 07/05/2023   NA 143 07/05/2023   K 5.1 07/05/2023   CL 104 07/05/2023   CREATININE 0.92 07/05/2023   BUN 12 07/05/2023   CO2 28 07/05/2023   TSH 3.06 07/05/2023   PSA 0.03 (L) 07/05/2023   INR 1.0 10/14/2021   HGBA1C 6.4 07/05/2023   MICROALBUR 1.4 07/05/2023   Assessment/Plan:  Albert Wade is a 77 y.o. White or Caucasian [1] male with  has a past medical history of Acute bronchitis (01/01/2015), Acute upper respiratory infection (05/19/2016), Anginal pain (HCC), Anxiety, Arthritis, BPH (benign prostatic hypertrophy), BRONCHITIS, CHRONIC (04/29/2008), CAD (10/16/2009), CAD (coronary artery disease), Chronic back pain, Chronic bronchitis, Chronic low back pain with sciatica (10/14/2016), Confusion (04/27/2017), COPD (chronic obstructive pulmonary disease) (HCC), Depression, Encounter for preventative adult health care exam with abnormal findings (12/20/2013), Fever (04/27/2017), GERD (gastroesophageal reflux disease), Gynecomastia (05/12/2015), H/O hiatal hernia, Heart murmur, History of kidney stones, HTN (hypertension), Hypercholesteremia, Hyperglycemia (05/19/2016), HYPERLIPIDEMIA (04/29/2008), Impaired glucose tolerance (12/20/2013), Insomnia (04/27/2017), Lumbosacral radiculopathy (10/14/2016), MI (myocardial infarction) (HCC), MYOCARDIAL INFARCTION (04/29/2008), OSA (obstructive sleep apnea) (05/13/2011), Peripheral neuropathy, Phimosis/adherent prepuce (10/21/2013), Pneumonia, Prostate cancer (HCC), Shortness of breath (11/05/2010), Sleep apnea, Squamous cell carcinoma of skin (10/29/2020), Squamous cell carcinoma of skin (01/09/2023), Umbilical hernia, Umbilical hernia without  obstruction and without gangrene (05/19/2016), URI (upper respiratory infection) (04/27/2017), UTI (urinary tract infection) (12/01/2016), Varicose veins, Varicose veins of bilateral lower extremities with other complications (10/14/2016), and Wheezing (01/01/2015).  Encounter for preventative adult health care exam with abnormal findings Age and sex appropriate education and counseling updated with regular exercise and diet Referrals for preventative services - none needed Immunizations addressed  - declines covid booster, for tdap and shingrix at pharmacy Smoking counseling  - none needed Evidence for depression or other mood disorder - none significant Most recent labs reviewed. I have personally reviewed and have noted: 1) the patient's medical and social history 2) The patient's current medications and supplements 3) The patient's height, weight, and BMI have been recorded in the chart   HLD (hyperlipidemia) Lab Results  Component Value Date   LDLCALC 70 07/05/2023   Uncontrolled, goal ldl < 70  pt to continue current statin zocor 40 mg - declines change for now, for lower chol diet   Hyperglycemia Lab Results  Component Value Date   HGBA1C 6.4 07/05/2023   Stable, pt to continue current medical treatment  - diet, wt control   Hypokalemia Lab Results  Component Value Date   NA 143 07/05/2023   K 5.1 07/05/2023   CO2 28 07/05/2023   GLUCOSE 118 (H) 07/05/2023   BUN 12  07/05/2023   CREATININE 0.92 07/05/2023   CALCIUM 9.5 07/05/2023   GFR 80.32 07/05/2023   EGFR 94 01/16/2023   GFRNONAA >60 08/23/2022  Improved, now for klor con 10 every day prn lasix   B12 deficiency Lab Results  Component Value Date   VITAMINB12 203 (L) 07/05/2023   Low to start oral replacement - b12 1000 mcg qd   HTN (hypertension) BP Readings from Last 3 Encounters:  07/05/23 124/68  05/10/23 (!) 157/74  05/02/23 (!) 146/68   Stable, pt to continue medical treatment norvasc 2.5 mg  qd   Anxiety and depression With recent worsening, for start celexa 10 qd  Chronic diastolic CHF (congestive heart failure) (HCC) Ok for restart lasix 40 every day prn swelling only for now  COPD (chronic obstructive pulmonary disease) (HCC) Stable, for albuterol hfa prn rerstart  Chronic back pain With recent MSSA infection and drainage, now sinus tract resolved today, pt to finish cephalexin, f/u MRI as ordered, and ID as planned  Followup: Return in about 6 months (around 01/05/2024).  Oliver Barre, MD 07/07/2023 9:44 PM [facility]

## 2023-07-05 NOTE — Patient Instructions (Signed)
Please take all new medication as prescribed  - the potassium only with the lasix  Please take all new medication as prescribed - the citalopram 10 mg per day  Please continue all other medications as before, and refills have been done if requested - the albuterol  Please have the pharmacy call with any other refills you may need.  Please continue your efforts at being more active, low cholesterol diet, and weight control.  You are otherwise up to date with prevention measures today.  Please keep your appointments with your specialists as you may have planned  Please go to the LAB at the blood drawing area for the tests to be done  You will be contacted by phone if any changes need to be made immediately.  Otherwise, you will receive a letter about your results with an explanation, but please check with MyChart first.  Please remember to sign up for MyChart if you have not done so, as this will be important to you in the future with finding out test results, communicating by private email, and scheduling acute appointments online when needed.  Please make an Appointment to return in 6 months, or sooner if needed

## 2023-07-07 ENCOUNTER — Encounter: Payer: Self-pay | Admitting: Internal Medicine

## 2023-07-07 DIAGNOSIS — F419 Anxiety disorder, unspecified: Secondary | ICD-10-CM | POA: Insufficient documentation

## 2023-07-07 DIAGNOSIS — F32A Depression, unspecified: Secondary | ICD-10-CM | POA: Insufficient documentation

## 2023-07-07 NOTE — Assessment & Plan Note (Signed)
Lab Results  Component Value Date   NA 143 07/05/2023   K 5.1 07/05/2023   CO2 28 07/05/2023   GLUCOSE 118 (H) 07/05/2023   BUN 12 07/05/2023   CREATININE 0.92 07/05/2023   CALCIUM 9.5 07/05/2023   GFR 80.32 07/05/2023   EGFR 94 01/16/2023   GFRNONAA >60 08/23/2022  Improved, now for klor con 10 every day prn lasix

## 2023-07-07 NOTE — Assessment & Plan Note (Signed)
BP Readings from Last 3 Encounters:  07/05/23 124/68  05/10/23 (!) 157/74  05/02/23 (!) 146/68   Stable, pt to continue medical treatment norvasc 2.5 mg qd

## 2023-07-07 NOTE — Assessment & Plan Note (Signed)
Ok for restart lasix 40 every day prn swelling only for now

## 2023-07-07 NOTE — Assessment & Plan Note (Signed)
With recent worsening, for start celexa 10 qd

## 2023-07-07 NOTE — Assessment & Plan Note (Signed)
Lab Results  Component Value Date   VITAMINB12 203 (L) 07/05/2023   Low to start oral replacement - b12 1000 mcg qd

## 2023-07-07 NOTE — Assessment & Plan Note (Signed)
Lab Results  Component Value Date   HGBA1C 6.4 07/05/2023   Stable, pt to continue current medical treatment  - diet, wt control

## 2023-07-07 NOTE — Assessment & Plan Note (Signed)
Lab Results  Component Value Date   LDLCALC 70 07/05/2023   Uncontrolled, goal ldl < 70  pt to continue current statin zocor 40 mg - declines change for now, for lower chol diet

## 2023-07-07 NOTE — Assessment & Plan Note (Signed)
With recent MSSA infection and drainage, now sinus tract resolved today, pt to finish cephalexin, f/u MRI as ordered, and ID as planned

## 2023-07-07 NOTE — Assessment & Plan Note (Signed)
Age and sex appropriate education and counseling updated with regular exercise and diet Referrals for preventative services - none needed Immunizations addressed - declines covid booster, for tdap and shingrix at pharmacy Smoking counseling  - none needed Evidence for depression or other mood disorder - none significant Most recent labs reviewed. I have personally reviewed and have noted: 1) the patient's medical and social history 2) The patient's current medications and supplements 3) The patient's height, weight, and BMI have been recorded in the chart  

## 2023-07-07 NOTE — Assessment & Plan Note (Signed)
Stable, for albuterol hfa prn rerstart

## 2023-07-12 ENCOUNTER — Ambulatory Visit: Payer: Medicare Other | Admitting: Dermatology

## 2023-07-13 ENCOUNTER — Other Ambulatory Visit: Payer: Self-pay | Admitting: Internal Medicine

## 2023-07-25 ENCOUNTER — Telehealth: Payer: Self-pay | Admitting: Internal Medicine

## 2023-07-25 MED ORDER — CITALOPRAM HYDROBROMIDE 20 MG PO TABS: 20.0000 mg | ORAL_TABLET | Freq: Every day | ORAL | 3 refills | Status: DC

## 2023-07-25 NOTE — Telephone Encounter (Signed)
Ok this is done erx 

## 2023-07-25 NOTE — Telephone Encounter (Signed)
Patient called and wants to know if the dosage of his celexa can be upped.  States it is helping but not a lot.  Please call patient at:  [phone]

## 2023-07-31 ENCOUNTER — Ambulatory Visit: Payer: Medicare Other | Admitting: Dermatology

## 2023-08-10 ENCOUNTER — Emergency Department: Payer: Medicare Other

## 2023-08-10 ENCOUNTER — Emergency Department
Admission: EM | Admit: 2023-08-10 | Discharge: 2023-08-11 | Disposition: A | Discharge status: Home / Self Care | Payer: Medicare Other | Attending: Emergency Medicine | Admitting: Emergency Medicine

## 2023-08-10 ENCOUNTER — Other Ambulatory Visit: Payer: Self-pay

## 2023-08-10 DIAGNOSIS — D649 Anemia, unspecified: Secondary | ICD-10-CM | POA: Insufficient documentation

## 2023-08-10 DIAGNOSIS — R509 Fever, unspecified: Secondary | ICD-10-CM

## 2023-08-10 DIAGNOSIS — I110 Hypertensive heart disease with heart failure: Secondary | ICD-10-CM | POA: Insufficient documentation

## 2023-08-10 DIAGNOSIS — R109 Unspecified abdominal pain: Secondary | ICD-10-CM | POA: Diagnosis not present

## 2023-08-10 DIAGNOSIS — Z1152 Encounter for screening for COVID-19: Secondary | ICD-10-CM | POA: Insufficient documentation

## 2023-08-10 DIAGNOSIS — K8020 Calculus of gallbladder without cholecystitis without obstruction: Secondary | ICD-10-CM | POA: Diagnosis not present

## 2023-08-10 DIAGNOSIS — I2510 Atherosclerotic heart disease of native coronary artery without angina pectoris: Secondary | ICD-10-CM | POA: Diagnosis not present

## 2023-08-10 DIAGNOSIS — N289 Disorder of kidney and ureter, unspecified: Secondary | ICD-10-CM | POA: Diagnosis not present

## 2023-08-10 DIAGNOSIS — I509 Heart failure, unspecified: Secondary | ICD-10-CM | POA: Insufficient documentation

## 2023-08-10 DIAGNOSIS — A419 Sepsis, unspecified organism: Secondary | ICD-10-CM | POA: Diagnosis not present

## 2023-08-10 DIAGNOSIS — R7989 Other specified abnormal findings of blood chemistry: Secondary | ICD-10-CM | POA: Diagnosis not present

## 2023-08-10 DIAGNOSIS — R590 Localized enlarged lymph nodes: Secondary | ICD-10-CM | POA: Diagnosis not present

## 2023-08-10 DIAGNOSIS — M47816 Spondylosis without myelopathy or radiculopathy, lumbar region: Secondary | ICD-10-CM | POA: Diagnosis not present

## 2023-08-10 DIAGNOSIS — Z981 Arthrodesis status: Secondary | ICD-10-CM | POA: Diagnosis not present

## 2023-08-10 DIAGNOSIS — R410 Disorientation, unspecified: Secondary | ICD-10-CM | POA: Diagnosis not present

## 2023-08-10 DIAGNOSIS — K8689 Other specified diseases of pancreas: Secondary | ICD-10-CM | POA: Diagnosis not present

## 2023-08-10 DIAGNOSIS — R6889 Other general symptoms and signs: Secondary | ICD-10-CM | POA: Diagnosis not present

## 2023-08-10 DIAGNOSIS — K828 Other specified diseases of gallbladder: Secondary | ICD-10-CM | POA: Diagnosis not present

## 2023-08-10 DIAGNOSIS — K8050 Calculus of bile duct without cholangitis or cholecystitis without obstruction: Secondary | ICD-10-CM | POA: Diagnosis not present

## 2023-08-10 DIAGNOSIS — N1330 Unspecified hydronephrosis: Secondary | ICD-10-CM | POA: Diagnosis not present

## 2023-08-10 DIAGNOSIS — I517 Cardiomegaly: Secondary | ICD-10-CM | POA: Diagnosis not present

## 2023-08-10 DIAGNOSIS — Z743 Need for continuous supervision: Secondary | ICD-10-CM | POA: Diagnosis not present

## 2023-08-10 DIAGNOSIS — R9431 Abnormal electrocardiogram [ECG] [EKG]: Secondary | ICD-10-CM | POA: Diagnosis not present

## 2023-08-10 LAB — APTT: aPTT: 25 s (ref 24–36)

## 2023-08-10 LAB — CBC WITH DIFFERENTIAL/PLATELET
Abs Immature Granulocytes: 0.03 10*3/uL (ref 0.00–0.07)
Basophils Absolute: 0 10*3/uL (ref 0.0–0.1)
Basophils Relative: 0 %
Eosinophils Absolute: 0 10*3/uL (ref 0.0–0.5)
Eosinophils Relative: 0 %
HCT: 31.9 % — ABNORMAL LOW (ref 39.0–52.0)
Hemoglobin: 10.1 g/dL — ABNORMAL LOW (ref 13.0–17.0)
Immature Granulocytes: 0 %
Lymphocytes Relative: 3 %
Lymphs Abs: 0.3 10*3/uL — ABNORMAL LOW (ref 0.7–4.0)
MCH: 27.4 pg (ref 26.0–34.0)
MCHC: 31.7 g/dL (ref 30.0–36.0)
MCV: 86.4 fL (ref 80.0–100.0)
Monocytes Absolute: 0.5 10*3/uL (ref 0.1–1.0)
Monocytes Relative: 5 %
Neutro Abs: 7.5 10*3/uL (ref 1.7–7.7)
Neutrophils Relative %: 92 %
Platelets: 133 10*3/uL — ABNORMAL LOW (ref 150–400)
RBC: 3.69 MIL/uL — ABNORMAL LOW (ref 4.22–5.81)
RDW: 15.8 % — ABNORMAL HIGH (ref 11.5–15.5)
WBC: 8.3 10*3/uL (ref 4.0–10.5)
nRBC: 0 % (ref 0.0–0.2)

## 2023-08-10 LAB — URINALYSIS, W/ REFLEX TO CULTURE (INFECTION SUSPECTED)
Bacteria, UA: NONE SEEN
Bilirubin Urine: NEGATIVE
Glucose, UA: NEGATIVE mg/dL
Hgb urine dipstick: NEGATIVE
Ketones, ur: NEGATIVE mg/dL
Leukocytes,Ua: NEGATIVE
Nitrite: NEGATIVE
Protein, ur: NEGATIVE mg/dL
Specific Gravity, Urine: 1.015 (ref 1.005–1.030)
Squamous Epithelial / HPF: NONE SEEN /HPF (ref 0–5)
pH: 5 (ref 5.0–8.0)

## 2023-08-10 LAB — COMPREHENSIVE METABOLIC PANEL WITH GFR
ALT: 166 U/L — ABNORMAL HIGH (ref 0–44)
AST: 299 U/L — ABNORMAL HIGH (ref 15–41)
Albumin: 3.1 g/dL — ABNORMAL LOW (ref 3.5–5.0)
Alkaline Phosphatase: 203 U/L — ABNORMAL HIGH (ref 38–126)
Anion gap: 9 (ref 5–15)
BUN: 19 mg/dL (ref 8–23)
CO2: 24 mmol/L (ref 22–32)
Calcium: 7.8 mg/dL — ABNORMAL LOW (ref 8.9–10.3)
Chloride: 104 mmol/L (ref 98–111)
Creatinine, Ser: 1.01 mg/dL (ref 0.61–1.24)
GFR, Estimated: >60 mL/min
Glucose, Bld: 125 mg/dL — ABNORMAL HIGH (ref 70–99)
Potassium: 3.9 mmol/L (ref 3.5–5.1)
Sodium: 137 mmol/L (ref 135–145)
Total Bilirubin: 2.3 mg/dL — ABNORMAL HIGH (ref 0.3–1.2)
Total Protein: 5.8 g/dL — ABNORMAL LOW (ref 6.5–8.1)

## 2023-08-10 LAB — PROTIME-INR
INR: 1.1 (ref 0.8–1.2)
Prothrombin Time: 14.6 s (ref 11.4–15.2)

## 2023-08-10 MED ORDER — LACTATED RINGERS IV BOLUS
1500.0000 mL | Freq: Once | INTRAVENOUS | Status: AC
  Administered 2023-08-11: 1500 mL via INTRAVENOUS

## 2023-08-10 MED ORDER — SODIUM CHLORIDE 0.9 % IV BOLUS (SEPSIS)
1000.0000 mL | Freq: Once | INTRAVENOUS | Status: AC
  Administered 2023-08-10: 1000 mL via INTRAVENOUS

## 2023-08-10 MED ORDER — SODIUM CHLORIDE 0.9 % IV SOLN
2.0000 g | Freq: Once | INTRAVENOUS | Status: AC
  Administered 2023-08-10: 2 g via INTRAVENOUS
  Filled 2023-08-10: qty 12.5

## 2023-08-10 MED ORDER — VANCOMYCIN HCL IN DEXTROSE 1-5 GM/200ML-% IV SOLN: 1000.0000 mg | Freq: Once | INTRAVENOUS | Status: DC

## 2023-08-10 MED ORDER — LACTATED RINGERS IV SOLN: INTRAVENOUS | Status: DC

## 2023-08-10 MED ORDER — VANCOMYCIN HCL 2000 MG/400ML IV SOLN
2000.0000 mg | Freq: Once | INTRAVENOUS | Status: AC
  Administered 2023-08-10: 2000 mg via INTRAVENOUS
  Filled 2023-08-10: qty 400

## 2023-08-10 NOTE — ED Provider Notes (Signed)
[facility] Provider Note    Event Date/Time   First MD Initiated Contact with Patient 08/10/23 2205     (approximate)   History   Fever   HPI  Albert Wade is a 77 y.o. male past medical history significant for CAD, heart failure, GERD, hypertension, hyperlipidemia, L2-4 laminectomy with chronic infection now followed by [facility] infectious disease, who presents to the emergency department with a fever.  Fever started today of 103.8.  Complaining of abdominal pain but no episodes of vomiting or diarrhea.  Denies any blood in his stool.  Complaining of diffuse abdominal pain and no significant back pain.  Denies any significant cough or shortness of breath.  No rash.  No tick exposure.  No dysuria, urinary urgency or frequency.  Fever free for the past 1 month.  States that he started having chronic fever for the past 1 year after his surgery in August 2023.  Has gone to the OR for debridement.  Currently on Keflex.  Plan to have an MRI in October and then possible OR for washout of the hardware.  States that he does not normally have abdominal pain and that his abdominal pain is new today.     Physical Exam   Triage Vital Signs: ED Triage Vitals  Encounter Vitals Group     BP      Systolic BP Percentile      Diastolic BP Percentile      Pulse      Resp      Temp      Temp src      SpO2      Weight      Height      Head Circumference      Peak Flow      Pain Score      Pain Loc      Pain Education      Exclude from Growth Chart     Most recent vital signs: Vitals:   08/10/23 2300 08/10/23 2330  BP: (!) 97/42 (!) 99/50  Pulse: 63 (!) 57  Resp: 16 12  Temp:    SpO2: 100% 95%    Physical Exam Constitutional:      Appearance: He is well-developed.  HENT:     Head: Atraumatic.  Eyes:     Conjunctiva/sclera: Conjunctivae normal.  Cardiovascular:     Rate and Rhythm: Regular rhythm.  Pulmonary:     Effort: No respiratory distress.   Abdominal:     Tenderness: There is abdominal tenderness.  Musculoskeletal:        General: Normal range of motion.     Cervical back: Normal range of motion.     Right lower leg: No edema.     Left lower leg: No edema.  Skin:    General: Skin is warm.     Capillary Refill: Capillary refill takes less than 2 seconds.     Comments: Midline lumbar surgical incision with no surrounding erythema warmth or induration.  No purulent drainage or fluctuance  Neurological:     Mental Status: He is alert. Mental status is at baseline.  Psychiatric:        Mood and Affect: Mood normal.     IMPRESSION / MDM / ASSESSMENT AND PLAN / ED COURSE  I reviewed the triage vital signs and the nursing notes.  On arrival patient was afebrile but did take Tylenol just prior to arrival.  Initial blood pressure was low of  85/47.  Started on sepsis protocol  Blood cultures obtained.  Started on IV vancomycin and cefepime.  Given 1 L of IV fluids and will reevaluate, given his history of heart failure felt that 30 cc/kg of IV fluids may be detrimental  EKG  I, Corena Herter, the attending physician, personally viewed and interpreted this ECG.   Rate: Normal  Rhythm: Normal sinus  Axis: Normal  Intervals: Normal  ST&T Change: None.  PACs present  No tachycardic or bradycardic dysrhythmias while on cardiac telemetry.  RADIOLOGY my interpretation of imaging: Chest x-ray with no signs of pneumonia  LABS (all labs ordered are listed, but only abnormal results are displayed) Labs interpreted as -    Labs Reviewed  COMPREHENSIVE METABOLIC PANEL - Abnormal; Notable for the following components:      Result Value   Glucose, Bld 125 (*)    Calcium 7.8 (*)    Total Protein 5.8 (*)    Albumin 3.1 (*)    AST 299 (*)    ALT 166 (*)    Alkaline Phosphatase 203 (*)    Total Bilirubin 2.3 (*)    All other components within normal limits  CBC WITH DIFFERENTIAL/PLATELET - Abnormal; Notable for the  following components:   RBC 3.69 (*)    Hemoglobin 10.1 (*)    HCT 31.9 (*)    RDW 15.8 (*)    Platelets 133 (*)    Lymphs Abs 0.3 (*)    All other components within normal limits  URINALYSIS, W/ REFLEX TO CULTURE (INFECTION SUSPECTED) - Abnormal; Notable for the following components:   Color, Urine AMBER (*)    APPearance CLEAR (*)    All other components within normal limits  CULTURE, BLOOD (ROUTINE X 2)  CULTURE, BLOOD (ROUTINE X 2)  SARS CORONAVIRUS 2 BY RT PCR  PROTIME-INR  APTT  LACTIC ACID, PLASMA  LACTIC ACID, PLASMA     MDM    Blood cultures obtained.  Initially given 1 L of IV fluids.  Continued to be hypotensive so given 30 cc/kg which is a total of 2.5 L.  Started on vancomycin and cefepime.  CT abdomen and pelvis ordered given his abdominal pain.  No leukocytosis.  Does have anemia with a hemoglobin of 10.1, platelets are low today at 133.  Abnormal LFTs with elevated AST, ALT and alk phos with an elevated T. bili of 2.3.  Plan for CT abdomen and pelvis, possible MRI of the lumbar spine if no other source of his fever.   PROCEDURES:  Critical Care performed: yes  .Critical Care  Performed by: Corena Herter, MD Authorized by: Corena Herter, MD   Critical care provider statement:    Critical care time (minutes):  30   Critical care time was exclusive of:  Separately billable procedures and treating other patients   Critical care was necessary to treat or prevent imminent or life-threatening deterioration of the following conditions:  Sepsis   Critical care was time spent personally by me on the following activities:  Development of treatment plan with patient or surrogate, discussions with consultants, evaluation of patient's response to treatment, examination of patient, ordering and review of laboratory studies, ordering and review of radiographic studies, ordering and performing treatments and interventions, pulse oximetry, re-evaluation of patient's  condition and review of old charts   Patient's presentation is most consistent with acute presentation with potential threat to life or bodily function.   MEDICATIONS ORDERED IN ED: Medications  lactated ringers infusion (  Intravenous New Bag/Given 08/10/23 2338)  sodium chloride 0.9 % bolus 1,000 mL (1,000 mLs Intravenous New Bag/Given 08/10/23 2328)  ceFEPIme (MAXIPIME) 2 g in sodium chloride 0.9 % 100 mL IVPB (2 g Intravenous New Bag/Given 08/10/23 2328)  vancomycin (VANCOREADY) IVPB 2000 mg/400 mL (2,000 mg Intravenous New Bag/Given 08/10/23 2332)  lactated ringers bolus 1,500 mL (has no administration in time range)    FINAL CLINICAL IMPRESSION(S) / ED DIAGNOSES   Final diagnoses:  Fever, unspecified fever cause  Sepsis, due to unspecified organism, unspecified whether acute organ dysfunction present [facility])     Rx / DC Orders   ED Discharge Orders     None        Note:  This document was prepared using Dragon voice recognition software and may include unintentional dictation errors.   Corena Herter, MD 08/10/23 2355

## 2023-08-10 NOTE — Progress Notes (Addendum)
Elink monitoring for the code sepsis protocol.  Verified w/ Bedside RN that Cultures were drawn before Antibiotics were started. Notified bedside nurse of need to draw lactic acid, as lab does not have the previously drawn tube.

## 2023-08-10 NOTE — Progress Notes (Signed)
PHARMACY -  BRIEF ANTIBIOTIC NOTE   Pharmacy has received consult(s) for Vanc, Cefepime from an ED provider.  The patient's profile has been reviewed for ht/wt/allergies/indication/available labs.    One time order(s) placed for Cefepime 2 gm IV X 1 and Vancomycin 2 gm IV X 1   Further antibiotics/pharmacy consults should be ordered by admitting physician if indicated.                       Thank you, , D 08/10/2023  10:51 PM

## 2023-08-10 NOTE — ED Triage Notes (Signed)
Pt BIB [facility] from home, pt is a chronic infection pt @ [facility], fever of 101 for EMS, other VSS, hx MI, liver and kidney disease. Reports of delirium earlier from wife, transient AMS. Recent LP with infection of site. Presents A&O x's 4.

## 2023-08-11 ENCOUNTER — Encounter (HOSPITAL_COMMUNITY): Payer: Self-pay

## 2023-08-11 ENCOUNTER — Emergency Department: Payer: Medicare Other

## 2023-08-11 ENCOUNTER — Inpatient Hospital Stay (HOSPITAL_COMMUNITY)
Admit: 2023-08-11 | Discharge: 2023-08-14 | DRG: 417 | Disposition: A | Discharge status: Home / Self Care | Payer: Medicare Other | Source: Other Acute Inpatient Hospital | Attending: Internal Medicine | Admitting: Internal Medicine

## 2023-08-11 ENCOUNTER — Encounter (HOSPITAL_COMMUNITY): Payer: Self-pay | Admitting: Internal Medicine

## 2023-08-11 DIAGNOSIS — I2510 Atherosclerotic heart disease of native coronary artery without angina pectoris: Secondary | ICD-10-CM | POA: Diagnosis not present

## 2023-08-11 DIAGNOSIS — Z7982 Long term (current) use of aspirin: Secondary | ICD-10-CM

## 2023-08-11 DIAGNOSIS — K8064 Calculus of gallbladder and bile duct with chronic cholecystitis without obstruction: Secondary | ICD-10-CM | POA: Diagnosis not present

## 2023-08-11 DIAGNOSIS — A419 Sepsis, unspecified organism: Secondary | ICD-10-CM | POA: Diagnosis not present

## 2023-08-11 DIAGNOSIS — N400 Enlarged prostate without lower urinary tract symptoms: Secondary | ICD-10-CM | POA: Diagnosis not present

## 2023-08-11 DIAGNOSIS — K828 Other specified diseases of gallbladder: Secondary | ICD-10-CM | POA: Diagnosis present

## 2023-08-11 DIAGNOSIS — Z79891 Long term (current) use of opiate analgesic: Secondary | ICD-10-CM

## 2023-08-11 DIAGNOSIS — K8309 Other cholangitis: Principal | ICD-10-CM | POA: Diagnosis present

## 2023-08-11 DIAGNOSIS — K219 Gastro-esophageal reflux disease without esophagitis: Secondary | ICD-10-CM | POA: Diagnosis not present

## 2023-08-11 DIAGNOSIS — K8020 Calculus of gallbladder without cholecystitis without obstruction: Secondary | ICD-10-CM | POA: Diagnosis not present

## 2023-08-11 DIAGNOSIS — Z83438 Family history of other disorder of lipoprotein metabolism and other lipidemia: Secondary | ICD-10-CM

## 2023-08-11 DIAGNOSIS — J449 Chronic obstructive pulmonary disease, unspecified: Secondary | ICD-10-CM | POA: Diagnosis present

## 2023-08-11 DIAGNOSIS — I5032 Chronic diastolic (congestive) heart failure: Secondary | ICD-10-CM | POA: Diagnosis present

## 2023-08-11 DIAGNOSIS — M47816 Spondylosis without myelopathy or radiculopathy, lumbar region: Secondary | ICD-10-CM | POA: Diagnosis not present

## 2023-08-11 DIAGNOSIS — Z981 Arthrodesis status: Secondary | ICD-10-CM

## 2023-08-11 DIAGNOSIS — E7800 Pure hypercholesterolemia, unspecified: Secondary | ICD-10-CM | POA: Diagnosis present

## 2023-08-11 DIAGNOSIS — Z89411 Acquired absence of right great toe: Secondary | ICD-10-CM

## 2023-08-11 DIAGNOSIS — Z8546 Personal history of malignant neoplasm of prostate: Secondary | ICD-10-CM | POA: Diagnosis not present

## 2023-08-11 DIAGNOSIS — I252 Old myocardial infarction: Secondary | ICD-10-CM

## 2023-08-11 DIAGNOSIS — Z8042 Family history of malignant neoplasm of prostate: Secondary | ICD-10-CM

## 2023-08-11 DIAGNOSIS — G8929 Other chronic pain: Secondary | ICD-10-CM | POA: Diagnosis not present

## 2023-08-11 DIAGNOSIS — Z87891 Personal history of nicotine dependence: Secondary | ICD-10-CM

## 2023-08-11 DIAGNOSIS — G4733 Obstructive sleep apnea (adult) (pediatric): Secondary | ICD-10-CM | POA: Diagnosis present

## 2023-08-11 DIAGNOSIS — G4700 Insomnia, unspecified: Secondary | ICD-10-CM | POA: Diagnosis not present

## 2023-08-11 DIAGNOSIS — D649 Anemia, unspecified: Secondary | ICD-10-CM | POA: Diagnosis not present

## 2023-08-11 DIAGNOSIS — F32A Depression, unspecified: Secondary | ICD-10-CM | POA: Diagnosis present

## 2023-08-11 DIAGNOSIS — C61 Malignant neoplasm of prostate: Secondary | ICD-10-CM | POA: Diagnosis present

## 2023-08-11 DIAGNOSIS — R109 Unspecified abdominal pain: Secondary | ICD-10-CM | POA: Diagnosis not present

## 2023-08-11 DIAGNOSIS — K8689 Other specified diseases of pancreas: Secondary | ICD-10-CM | POA: Diagnosis not present

## 2023-08-11 DIAGNOSIS — Z79899 Other long term (current) drug therapy: Secondary | ICD-10-CM

## 2023-08-11 DIAGNOSIS — K8050 Calculus of bile duct without cholangitis or cholecystitis without obstruction: Secondary | ICD-10-CM

## 2023-08-11 DIAGNOSIS — Z85828 Personal history of other malignant neoplasm of skin: Secondary | ICD-10-CM

## 2023-08-11 DIAGNOSIS — I10 Essential (primary) hypertension: Secondary | ICD-10-CM | POA: Diagnosis present

## 2023-08-11 DIAGNOSIS — I110 Hypertensive heart disease with heart failure: Secondary | ICD-10-CM | POA: Diagnosis not present

## 2023-08-11 DIAGNOSIS — R590 Localized enlarged lymph nodes: Secondary | ICD-10-CM | POA: Diagnosis not present

## 2023-08-11 DIAGNOSIS — Z888 Allergy status to other drugs, medicaments and biological substances status: Secondary | ICD-10-CM

## 2023-08-11 DIAGNOSIS — I509 Heart failure, unspecified: Secondary | ICD-10-CM | POA: Diagnosis not present

## 2023-08-11 DIAGNOSIS — Z825 Family history of asthma and other chronic lower respiratory diseases: Secondary | ICD-10-CM

## 2023-08-11 DIAGNOSIS — N1330 Unspecified hydronephrosis: Secondary | ICD-10-CM | POA: Diagnosis not present

## 2023-08-11 DIAGNOSIS — E785 Hyperlipidemia, unspecified: Secondary | ICD-10-CM | POA: Diagnosis present

## 2023-08-11 DIAGNOSIS — Z1152 Encounter for screening for COVID-19: Secondary | ICD-10-CM | POA: Diagnosis not present

## 2023-08-11 DIAGNOSIS — K851 Biliary acute pancreatitis without necrosis or infection: Secondary | ICD-10-CM | POA: Diagnosis not present

## 2023-08-11 DIAGNOSIS — R7989 Other specified abnormal findings of blood chemistry: Secondary | ICD-10-CM | POA: Diagnosis not present

## 2023-08-11 DIAGNOSIS — F419 Anxiety disorder, unspecified: Secondary | ICD-10-CM | POA: Diagnosis present

## 2023-08-11 DIAGNOSIS — G629 Polyneuropathy, unspecified: Secondary | ICD-10-CM | POA: Diagnosis present

## 2023-08-11 LAB — CBC
HCT: 33 % — ABNORMAL LOW (ref 39.0–52.0)
Hemoglobin: 10.6 g/dL — ABNORMAL LOW (ref 13.0–17.0)
MCH: 27 pg (ref 26.0–34.0)
MCHC: 32.1 g/dL (ref 30.0–36.0)
MCV: 84 fL (ref 80.0–100.0)
Platelets: 135 10*3/uL — ABNORMAL LOW (ref 150–400)
RBC: 3.93 MIL/uL — ABNORMAL LOW (ref 4.22–5.81)
RDW: 16.1 % — ABNORMAL HIGH (ref 11.5–15.5)
WBC: 8.9 10*3/uL (ref 4.0–10.5)
nRBC: 0 % (ref 0.0–0.2)

## 2023-08-11 LAB — LIPASE, BLOOD: Lipase: 32 U/L (ref 11–51)

## 2023-08-11 LAB — CREATININE, SERUM
Creatinine, Ser: 0.83 mg/dL (ref 0.61–1.24)
GFR, Estimated: >60 mL/min

## 2023-08-11 LAB — SARS CORONAVIRUS 2 BY RT PCR: SARS Coronavirus 2 by RT PCR: NEGATIVE

## 2023-08-11 LAB — MRSA NEXT GEN BY PCR, NASAL: MRSA by PCR Next Gen: NOT DETECTED

## 2023-08-11 LAB — LACTIC ACID, PLASMA: Lactic Acid, Venous: 1.3 mmol/L (ref 0.5–1.9)

## 2023-08-11 LAB — PROTIME-INR
INR: 1.2 (ref 0.8–1.2)
Prothrombin Time: 15.2 s (ref 11.4–15.2)

## 2023-08-11 MED ORDER — HEPARIN SODIUM (PORCINE) 5000 UNIT/ML IJ SOLN
5000.0000 [IU] | Freq: Three times a day (TID) | INTRAMUSCULAR | Status: DC
  Filled 2023-08-11 (×3): qty 1

## 2023-08-11 MED ORDER — NALOXONE HCL 0.4 MG/ML IJ SOLN: 0.4000 mg | INTRAMUSCULAR | Status: DC | PRN

## 2023-08-11 MED ORDER — HYDRALAZINE HCL 20 MG/ML IJ SOLN: 10.0000 mg | Freq: Four times a day (QID) | INTRAMUSCULAR | Status: DC | PRN

## 2023-08-11 MED ORDER — LACTATED RINGERS IV SOLN: INTRAVENOUS | Status: AC

## 2023-08-11 MED ORDER — PIPERACILLIN-TAZOBACTAM 3.375 G IVPB
3.3750 g | Freq: Three times a day (TID) | INTRAVENOUS | Status: DC
  Administered 2023-08-11 – 2023-08-14 (×8): 3.375 g via INTRAVENOUS
  Filled 2023-08-11 (×8): qty 50

## 2023-08-11 MED ORDER — METRONIDAZOLE 500 MG/100ML IV SOLN
500.0000 mg | Freq: Once | INTRAVENOUS | Status: AC
  Administered 2023-08-11: 500 mg via INTRAVENOUS
  Filled 2023-08-11: qty 100

## 2023-08-11 MED ORDER — ASPIRIN 81 MG PO TBEC
81.0000 mg | DELAYED_RELEASE_TABLET | Freq: Every day | ORAL | Status: DC
  Administered 2023-08-11 – 2023-08-14 (×4): 81 mg via ORAL
  Filled 2023-08-11 (×6): qty 1

## 2023-08-11 MED ORDER — CITALOPRAM HYDROBROMIDE 20 MG PO TABS
20.0000 mg | ORAL_TABLET | Freq: Every day | ORAL | Status: DC
  Administered 2023-08-11 – 2023-08-14 (×4): 20 mg via ORAL
  Filled 2023-08-11 (×4): qty 1

## 2023-08-11 MED ORDER — DOCUSATE SODIUM 100 MG PO CAPS
100.0000 mg | ORAL_CAPSULE | Freq: Two times a day (BID) | ORAL | Status: DC
  Administered 2023-08-11 – 2023-08-13 (×4): 100 mg via ORAL
  Filled 2023-08-11 (×4): qty 1

## 2023-08-11 MED ORDER — ALBUTEROL SULFATE HFA 108 (90 BASE) MCG/ACT IN AERS: 2.0000 | INHALATION_SPRAY | RESPIRATORY_TRACT | Status: DC | PRN

## 2023-08-11 MED ORDER — HYDROMORPHONE HCL 1 MG/ML IJ SOLN
0.5000 mg | INTRAMUSCULAR | Status: DC | PRN
  Administered 2023-08-11: 1 mg via INTRAVENOUS
  Filled 2023-08-11: qty 1

## 2023-08-11 MED ORDER — GADOBUTROL 1 MMOL/ML IV SOLN
8.0000 mL | Freq: Once | INTRAVENOUS | Status: AC | PRN
  Administered 2023-08-11: 8 mL via INTRAVENOUS

## 2023-08-11 MED ORDER — HEPARIN SODIUM (PORCINE) 5000 UNIT/ML IJ SOLN: 5000.0000 [IU] | Freq: Three times a day (TID) | INTRAMUSCULAR | Status: DC

## 2023-08-11 MED ORDER — TRAZODONE HCL 50 MG PO TABS
50.0000 mg | ORAL_TABLET | Freq: Every evening | ORAL | Status: DC | PRN
  Administered 2023-08-11 – 2023-08-13 (×2): 50 mg via ORAL
  Filled 2023-08-11 (×2): qty 1

## 2023-08-11 MED ORDER — ALBUTEROL SULFATE (2.5 MG/3ML) 0.083% IN NEBU: 2.5000 mg | INHALATION_SOLUTION | RESPIRATORY_TRACT | Status: DC | PRN

## 2023-08-11 MED ORDER — PANTOPRAZOLE SODIUM 40 MG IV SOLR
40.0000 mg | Freq: Two times a day (BID) | INTRAVENOUS | Status: DC
  Administered 2023-08-11 – 2023-08-14 (×6): 40 mg via INTRAVENOUS
  Filled 2023-08-11 (×6): qty 10

## 2023-08-11 MED ORDER — ONDANSETRON HCL 4 MG PO TABS: 4.0000 mg | ORAL_TABLET | Freq: Four times a day (QID) | ORAL | Status: DC | PRN

## 2023-08-11 MED ORDER — IOHEXOL 300 MG/ML  SOLN
100.0000 mL | Freq: Once | INTRAMUSCULAR | Status: AC | PRN
  Administered 2023-08-11: 100 mL via INTRAVENOUS

## 2023-08-11 MED ORDER — PIPERACILLIN-TAZOBACTAM 3.375 G IVPB
3.3750 g | Freq: Three times a day (TID) | INTRAVENOUS | Status: DC
  Administered 2023-08-11: 3.375 g via INTRAVENOUS
  Filled 2023-08-11: qty 50

## 2023-08-11 MED ORDER — OXYCODONE HCL 5 MG PO TABS
30.0000 mg | ORAL_TABLET | Freq: Four times a day (QID) | ORAL | Status: DC | PRN
  Administered 2023-08-11 – 2023-08-14 (×6): 30 mg via ORAL
  Filled 2023-08-11 (×6): qty 6

## 2023-08-11 MED ORDER — ONDANSETRON HCL 4 MG/2ML IJ SOLN: 4.0000 mg | Freq: Four times a day (QID) | INTRAMUSCULAR | Status: DC | PRN

## 2023-08-11 NOTE — Consult Note (Signed)
Consultation  Referring Provider: Merwyn Katos, MD Primary Care Physician:  Corwin Levins, MD Primary Gastroenterologist:  unassigned  Reason for Consultation: Abdominal pain, fever, common bile duct stone on MRCP  HPI: Albert Wade is a 77 y.o. male who was transferred this afternoon from [facility] where he presented earlier today after an episode of acute abdominal pain located in the epigastrium and right upper quadrant associated with fever to 103.  On Thursday evening. He has been having intermittent episodes of less severe upper abdominal pain over the past few weeks. Workup at [facility] with abdominal ultrasound showing a well-distended gallbladder no wall thickening, small gallstones, common bile duct of 12 mm tapering to 7 mm MRI/MRCP reveals several tiny stones in the gallbladder, no gallbladder wall thickening or pericholecystic fluid, common bile duct 9 mm, down to 7 to 8 mm at the head of the pancreas, there is a potential 2 mm stone in the distal common bile duct just proximal to the ampulla  He also underwent MRI of the lumbar spine as he has had issues with spinal infection and has had numerous prior back surgeries. This showed a degenerated and postoperative lumbar spine with up to moderate foraminal narrowing, no clear sign of spinal infection  Initial labs-08/10/2023 with WBC of 8.3/hemoglobin 10.1/hematocrit 31.9 INR 1.1 T. bili 2.3/alk phos 203/AST 299/ALT 166 Lipase within normal limits Blood cultures are pending  Lactate today 1.3, remainder of today's labs pending  He has been started on Zosyn at home  Today he says he is feeling better but remains sore in his abdomen and is tender with any palpation of his abdomen though he is not having severe pain he has not spiked any further fevers.  He is hungry and has been n.p.o. since he presented to the emergency room at [facility].  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 previous fractures   BPH (benign prostatic hypertrophy)    BRONCHITIS, CHRONIC 04/29/2008   Qualifier: Diagnosis of  By: Craige Cotta MD, Vineet     CAD 10/16/2009   Qualifier: Diagnosis of  By: Oswald Hillock     CAD (coronary artery disease)    angioplasty    Chronic back pain    Chronic bronchitis    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/2016   Confusion 04/27/2017   COPD (chronic obstructive pulmonary disease) (HCC)    Depression    Encounter for preventative adult health care exam with abnormal findings 12/20/2013   Fever 04/27/2017   GERD (gastroesophageal reflux disease)    Gynecomastia 05/12/2015   H/O hiatal hernia    pts wife states pt does not have    Heart murmur    History of kidney stones    HTN (hypertension)    Hypercholesteremia    Hyperglycemia 05/19/2016   HYPERLIPIDEMIA 04/29/2008   Qualifier: Diagnosis of  By: Marinus Maw     Impaired glucose tolerance 12/20/2013   Insomnia 04/27/2017   Lumbosacral radiculopathy 10/14/2016   MI (myocardial infarction) [facility])    mid-'90's   MYOCARDIAL INFARCTION 04/29/2008   Qualifier: History of  By: Marinus Maw     OSA (obstructive sleep  apnea) 05/13/2011   Split night study 2011:  AHI 21/hr with obstructive and central events.  Placed on cpap and titrated to 13cm with reasonable control, but attempts to increase pressure increased the number of central events.  Medium resmed quattro full face mask used.     Peripheral neuropathy    peripheral neuropathy   Phimosis/adherent prepuce 10/21/2013   Pneumonia    Prostate cancer (HCC)    Shortness of breath 11/05/2010   Qualifier:  Diagnosis of  By: Annamary Rummage, RN, BSN, Jacquelyn     Sleep apnea    mild-no cpap, unable to tolerate   Squamous cell carcinoma of skin 10/29/2020   right mid volar forearm   Squamous cell carcinoma of skin 01/09/2023   Left forearm - [facility]   Umbilical hernia    Umbilical hernia without obstruction and without gangrene 05/19/2016   URI (upper respiratory infection) 04/27/2017   UTI (urinary tract infection) 12/01/2016   Varicose veins    Varicose veins of bilateral lower extremities with other complications 10/14/2016   Wheezing 01/01/2015    Past Surgical History:  Procedure Laterality Date   AMPUTATION TOE Right 04/01/2022   Procedure: AMPUTATION TOE-Right Great Toe Amputation;  Surgeon: Edwin Cap, DPM;  Location: [facility] ORS;  Service: Podiatry;  Laterality: Right;   ANGIOPLASTY     APPENDECTOMY     ARTERY BIOPSY Right 10/15/2021   Procedure: BIOPSY TEMPORAL ARTERY;  Surgeon: Sung Amabile, DO;  Location: [facility] ORS;  Service: General;  Laterality: Right;   BACK SURGERY     x3   CARDIAC CATHETERIZATION     11'11   CIRCUMCISION N/A 10/21/2013   Procedure: CIRCUMCISION ADULT;  Surgeon: Valetta Fuller, MD;  Location: [facility] ORS;  Service: Urology;  Laterality: N/A;   COLONOSCOPY WITH PROPOFOL N/A 06/09/2021   Procedure: COLONOSCOPY WITH PROPOFOL;  Surgeon: Wyline Mood, MD;  Location: [facility] ENDOSCOPY;  Service: Gastroenterology;  Laterality: N/A;   CYSTOSCOPY N/A 10/21/2013   Procedure: CYSTOSCOPY FLEXIBLE;  Surgeon: Valetta Fuller, MD;  Location: [facility] ORS;  Service: Urology;  Laterality: N/A;   CYSTOSCOPY W/ URETERAL STENT PLACEMENT Right 06/01/2020   Procedure: CYSTOSCOPY WITH RETROGRADE PYELOGRAM/URETERAL STENT Exchange;  Surgeon: Vanna Scotland, MD;  Location: [facility] ORS;  Service: Urology;  Laterality: Right;   CYSTOSCOPY WITH BIOPSY Right 01/06/2020   Procedure: CYSTOSCOPY WITH BIOPSY;  Surgeon: Vanna Scotland, MD;  Location: [facility] ORS;  Service: Urology;  Laterality: Right;    CYSTOSCOPY/URETEROSCOPY/HOLMIUM LASER/STENT PLACEMENT Right 01/06/2020   Procedure: CYSTOSCOPY/URETEROSCOPY/HOLMIUM LASER/STENT PLACEMENT;  Surgeon: Vanna Scotland, MD;  Location: [facility] ORS;  Service: Urology;  Laterality: Right;   ESOPHAGOGASTRODUODENOSCOPY N/A 02/03/2021   Procedure: ESOPHAGOGASTRODUODENOSCOPY (EGD);  Surgeon: Wyline Mood, MD;  Location: [facility] ENDOSCOPY;  Service: Gastroenterology;  Laterality: N/A;   ESOPHAGOGASTRODUODENOSCOPY (EGD) WITH PROPOFOL N/A 03/09/2021   Procedure: ESOPHAGOGASTRODUODENOSCOPY (EGD) WITH PROPOFOL;  Surgeon: Wyline Mood, MD;  Location: [facility] ENDOSCOPY;  Service: Gastroenterology;  Laterality: N/A;   HERNIA REPAIR     INSERTION OF MESH N/A 06/17/2016   Procedure: INSERTION OF MESH;  Surgeon: Glenna Fellows, MD;  Location: [facility]S;  Service: General;  Laterality: N/A;   UMBILICAL HERNIA REPAIR N/A 06/17/2016   Procedure: REPAIR UMBILICAL HERNIA WITH MESH;  Surgeon: Glenna Fellows, MD;  Location: [facility] ORS;  Service: General;  Laterality: N/A;   WRIST SURGERY Right     Prior to Admission medications   Medication Sig Start Date End Date Taking? Authorizing Provider  albuterol (VENTOLIN HFA) 108 (90 Base) MCG/ACT inhaler INHALE  2 PUFFS INTO THE LUNGS 4 TIMES DAILY AS NEEDED 07/05/23   Corwin Levins, MD  amLODipine (NORVASC) 2.5 MG tablet TAKE 1 TABLET BY MOUTH DAILY 05/26/23   Pricilla Riffle, MD  aspirin 81 MG tablet Take 81 mg by mouth daily.    [provider]  blood glucose meter kit and supplies KIT Dispense based on patient and insurance preference. Use up to four times daily as directed. 10/16/21   Pennie Banter, DO  cephALEXin (KEFLEX) 500 MG capsule Take 3,000 mg by mouth daily.    [provider]  citalopram (CELEXA) 20 MG tablet Take 1 tablet (20 mg total) by mouth daily. 07/25/23   Corwin Levins, MD  DULoxetine (CYMBALTA) 60 MG capsule Take 60 mg by mouth 2 (two) times daily.  04/23/20   [provider]  furosemide (LASIX) 40  MG tablet 1 tab by mouth once per day as needed 07/05/23   Corwin Levins, MD  omeprazole (PRILOSEC) 20 MG capsule TAKE ONE CAPSULE BY MOUTH TWICE A DAY 05/02/23   Wyline Mood, MD  oxycodone (ROXICODONE) 30 MG immediate release tablet 1 tab by mouth 5 times per day 07/05/23   Corwin Levins, MD  oxyCODONE-acetaminophen (PERCOCET) 10-325 MG tablet Take 2 tablets by mouth every 4 (four) hours as needed for pain.    [provider]  potassium chloride (KLOR-CON 10) 10 MEQ tablet 1 tab by mouth once daily when taking lasix 07/05/23   Corwin Levins, MD  simvastatin (ZOCOR) 40 MG tablet TAKE 1 TABLET BY MOUTH AT BEDTIME 07/13/23   Pricilla Riffle, MD  traZODone (DESYREL) 50 MG tablet Take 1 tablet (50 mg total) by mouth at bedtime as needed for sleep. 10/16/21   Pennie Banter, DO    Current Facility-Administered Medications  Medication Dose Route Frequency Provider Last Rate Last Admin   albuterol (PROVENTIL) (2.5 MG/3ML) 0.083% nebulizer solution 2.5 mg  2.5 mg Nebulization Q4H PRN Leroy Sea, MD       aspirin EC tablet 81 mg  81 mg Oral Daily Leroy Sea, MD   81 mg at 08/11/23 1622   citalopram (CELEXA) tablet 20 mg  20 mg Oral Daily Leroy Sea, MD       docusate sodium (COLACE) capsule 100 mg  100 mg Oral BID Leroy Sea, MD       heparin injection 5,000 Units  5,000 Units Subcutaneous Q8H Bow Buntyn S, PA-C       hydrALAZINE (APRESOLINE) injection 10 mg  10 mg Intravenous Q6H PRN Leroy Sea, MD       HYDROmorphone (DILAUDID) injection 0.5-1 mg  0.5-1 mg Intravenous Q2H PRN Leroy Sea, MD       lactated ringers infusion   Intravenous Continuous Leroy Sea, MD 100 mL/hr at 08/11/23 1623 New Bag at 08/11/23 1623   naloxone (NARCAN) injection 0.4 mg  0.4 mg Intravenous PRN Leroy Sea, MD       ondansetron [facility]) tablet 4 mg  4 mg Oral Q6H PRN Leroy Sea, MD       Or   ondansetron [facility]) injection 4 mg  4 mg Intravenous Q6H PRN  Leroy Sea, MD       oxyCODONE (Oxy IR/ROXICODONE) immediate release tablet 30 mg  30 mg Oral Q6H PRN Leroy Sea, MD       pantoprazole (PROTONIX) injection 40 mg  40 mg Intravenous Q12H Susa Raring  K, MD        Allergies as of 08/11/2023 - Reviewed 08/11/2023  Allergen Reaction Noted   Cyclobenzaprine  05/11/2020   Seroquel [quetiapine] Hives 06/01/2015    Family History  Problem Relation Age of Onset   Prostate cancer Father    Emphysema Father    Hyperlipidemia Father    Hyperlipidemia Mother     Social History   Socioeconomic History   Marital status: Married    Spouse name: Not on file   Number of children: Not on file   Years of education: Not on file   Highest education level: Not on file  Occupational History   Occupation: retired Naval architect  Tobacco Use   Smoking status: Former    Current packs/day: 0.00    Average packs/day: 0.5 packs/day for 40.0 years (20.0 ttl pk-yrs)    Types: Cigarettes    Start date: 12/27/1955    Quit date: 12/27/1995    Years since quitting: 27.6   Smokeless tobacco: Never  Vaping Use   Vaping status: Never Used  Substance and Sexual Activity   Alcohol use: No   Drug use: No   Sexual activity: Yes  Other Topics Concern   Not on file  Social History Narrative   Not on file   Social Determinants of Health   Financial Resource Strain: High Risk (05/13/2023)   Received from [facility]   Overall Financial Resource Strain (CARDIA)    Difficulty of Paying Living Expenses: Hard  Food Insecurity: No Food Insecurity (08/11/2023)   Hunger Vital Sign    Worried About Running Out of Food in the Last Year: Never true    Ran Out of Food in the Last Year: Never true  Transportation Needs: No Transportation Needs (08/11/2023)   PRAPARE - Administrator, Civil Service (Medical): No    Lack of Transportation (Non-Medical): No  Physical Activity: Not on file   Stress: Not on file  Social Connections: Not on file  Intimate Partner Violence: Not At Risk (08/11/2023)   Humiliation, Afraid, Rape, and Kick questionnaire    Fear of Current or Ex-Partner: No    Emotionally Abused: No    Physically Abused: No    Sexually Abused: No    Review of Systems: Pertinent positive and negative review of systems were noted in the above HPI section.  All other review of systems was otherwise negative.   Physical Exam: Vital signs in last 24 hours: Temp:  [98.3 F (36.8 C)-99.5 F (37.5 C)] 98.3 F (36.8 C) (08/16 1543) Pulse Rate:  [49-74] 69 (08/16 1543) Resp:  [9-21] 16 (08/16 1543) BP: (85-168)/(42-79) 101/76 (08/16 1543) SpO2:  [94 %-100 %] 97 % (08/16 1543) Weight:  [82 kg-82.3 kg] 82 kg (08/16 1607)   General:   Alert,  Well-developed, well-nourished, elderly white male pleasant and cooperative in NAD-wife at bedside Head:  Normocephalic and atraumatic. Eyes:  Sclera early icterus conjunctiva pink. Ears:  Normal auditory acuity. Nose:  No deformity, discharge,  or lesions. Mouth:  No deformity or lesions.   Neck:  Supple; no masses or thyromegaly. Lungs:  Clear throughout to auscultation.   No wheezes, crackles, or rhonchi . Heart:  Regular rate and rhythm; no murmurs, clicks, rubs,  or gallops. Abdomen:  Soft, nondistended, bowel sounds are present, he is tender in the epigastrium and right upper quadrant, no rebound no palpable mass or hepatosplenomegaly Rectal not done Msk:  Symmetrical without  gross deformities. . Pulses:  Normal pulses noted. Extremities:  Without clubbing or edema. Neurologic:  Alert and  oriented x4;  grossly normal neurologically. Skin:  Intact without significant lesions or rashes.. Psych:  Alert and cooperative. Normal mood and affect.  Intake/Output from previous day: No intake/output data recorded. Intake/Output this shift: No intake/output data recorded.  Lab Results: Recent Labs    08/10/23 2248  WBC  8.3  HGB 10.1*  HCT 31.9*  PLT 133*   BMET Recent Labs    08/10/23 2248  NA 137  K 3.9  CL 104  CO2 24  GLUCOSE 125*  BUN 19  CREATININE 1.01  CALCIUM 7.8*   LFT Recent Labs    08/10/23 2248  PROT 5.8*  ALBUMIN 3.1*  AST 299*  ALT 166*  ALKPHOS 203*  BILITOT 2.3*   PT/INR Recent Labs    08/10/23 2248  LABPROT 14.6  INR 1.1   Hepatitis Panel No results for input(s): "HEPBSAG", "HCVAB", "HEPAIGM", "HEPBIGM" in the last 72 hours.   IMPRESSION:  #52 77 year old white male who has recently been having recurrent episodes of epigastric/right upper quadrant pain, who has also had some intermittent fevers recently but this is in the background of history of spinal infection and patient thought this may have been due to that. On 08/10/2023 he developed a more severe episode of the epigastric/right upper quadrant pain, and fever to 103 at home, and presented to the emergency room.  Workup thus far is consistent with cholelithiasis with no imaging evidence of cholecystitis, and on MRCP has a dilated CBD and a probable 3 mm common bile duct stone Interesting no leukocytosis, elevated LFTs yesterday with T. bili of 2.3 pending today  Concern for choledocholithiasis but with fever also concerning for cholangitis. Been on Zosyn, blood cultures pending  #2 chronic back pain with chronic narcotic use #3.  Coronary artery disease status post remote angioplasty, not currently anticoagulated #4 hypertension  #5 hyperlipidemia #6 history of prostate cancer  PLAN:  Clear liquids tonight, n.p.o. after midnight Continue Zosyn Patient has been scheduled for ERCP with stone extraction tomorrow a.m. with Dr. Chales Abrahams.  Procedure has been discussed in detail with the patient including indications risks and benefits and he is agreeable to proceed.  Hold heparin in a.m., do not resume until further orders from GI Patient also needs surgical consultation, and probably should have  laparoscopic cholecystectomy during this admission. Will follow-up labs in a.m.   Aisha Greenberger PA-C 08/11/2023, 4:58 PM

## 2023-08-11 NOTE — ED Notes (Signed)
I received verbal order to discontinue 2nd lactic acid from Albert Wade M.D. 2 lactic acid tubes were sent, but only 1 resulted.

## 2023-08-11 NOTE — Plan of Care (Signed)
Transfer from [facility] to [facility] Albert Wade is a 77 year old male with past medical history of MSSA bacteremia with infection of lumbar fusion implant, CAD, CHF, HTN, HLD, GERD who had been on Keflex for treatment who presents with right upper quadrant pain.  Initially noted to have temperature 99.5 F, blood pressure as low as 85/47 with improvement with IV fluids, heart rates 49-73, and all other vital signs maintained.  Labs noted WBC 8.3, hemoglobin 10.1, calcium 7.8, alkaline phosphatase 203, lipase 32, AST 299, ALT 166, total bilirubin 2.3.  MRCP concerning for a 2 mm stone in the distal common bile duct just proximal to the ampulla with common bile duct measuring 7-8 mm.  Patient had received a bolus of IV fluids, vancomycin, metronidazole, and cefepime.  Dr. Rhea Belton of GI had been consulted and recommended transfer.

## 2023-08-11 NOTE — Progress Notes (Signed)
Pharmacy Antibiotic Note  Albert Wade is a 77 y.o. male admitted on 08/11/2023 with  intra-abdominal infection, concern for choledocholithiasis with ERCP tomorrow .  Pharmacy has been consulted for Zosyn dosing. Pt on Zosyn prior to transfer from [facility], last dose at 11:00.   Plan: Zosyn 3.375g IV q8h (4 hour infusion). Follow culture data for de-escalation.  Monitor renal function for dose adjustments as indicated.   Height: 5' 10.51" (179.1 cm) Weight: 82 kg (180 lb 12.4 oz) IBW/kg (Calculated) : 74.18  Temp (24hrs), Avg:98.9 F (37.2 C), Min:98.3 F (36.8 C), Max:99.5 F (37.5 C)  Recent Labs  Lab 08/10/23 2248 08/11/23 0048  WBC 8.3  --   CREATININE 1.01  --   LATICACIDVEN  --  1.3    Estimated Creatinine Clearance: 64.3 mL/min (by C-G formula based on SCr of 1.01 mg/dL).    Allergies  Allergen Reactions   Cyclobenzaprine     agitation    Seroquel [Quetiapine] Hives    Thank you for allowing pharmacy to be a part of this patient's care.  Estill Batten, PharmD, BCCCP  08/11/2023 5:16 PM

## 2023-08-11 NOTE — Consult Note (Signed)
Surgical Evaluation Requesting provider: Susa Raring MD  Chief Complaint: Choledocholithiasis  HPI: Very pleasant 77 year old man with the medical problems listed below and previous abdominal surgery of open appendectomy, laparoscopic?  Converted to open prostatectomy, umbilical hernia repair who was transferred to [facility] for ERCP after being diagnosed with choledocholithiasis radiographic gallstone pancreatitis at [facility].  Earlier today shows cholelithiasis, choledocholithiasis without biliary dilatation, mild peripancreatic edema.  Labs yesterday notable for bilirubin of 2.3, AST/ALT 299/166 respectively, alk phos 203. Currently denies any abdominal pain, but had about 10 days of this leading up to presentation mostly in the right upper quadrant.  He is going for ERCP tomorrow.  Allergies  Allergen Reactions   Cyclobenzaprine     agitation    Seroquel [Quetiapine] Hives    Past Medical History:  Diagnosis Date   Acute bronchitis 01/01/2015   Acute upper respiratory infection 05/19/2016   Anginal pain (HCC)    Anxiety    Arthritis    back,wrists,hx. previous fractures   BPH (benign prostatic hypertrophy)    BRONCHITIS, CHRONIC 04/29/2008   Qualifier: Diagnosis of  By: Craige Cotta MD, Vineet     CAD 10/16/2009   Qualifier: Diagnosis of  By: Oswald Hillock     CAD (coronary artery disease)    angioplasty    Chronic back pain    Chronic bronchitis    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/2016   Confusion 04/27/2017   COPD (chronic obstructive pulmonary disease) (HCC)    Depression    Encounter for preventative adult health care exam with abnormal findings 12/20/2013   Fever 04/27/2017   GERD (gastroesophageal reflux disease)    Gynecomastia 05/12/2015   H/O hiatal hernia    pts wife states pt does not have    Heart murmur    History of kidney stones    HTN (hypertension)    Hypercholesteremia    Hyperglycemia 05/19/2016   HYPERLIPIDEMIA 04/29/2008   Qualifier:  Diagnosis of  By: Marinus Maw     Impaired glucose tolerance 12/20/2013   Insomnia 04/27/2017   Lumbosacral radiculopathy 10/14/2016   MI (myocardial infarction) [facility])    mid-'90's   MYOCARDIAL INFARCTION 04/29/2008   Qualifier: History of  By: Marinus Maw     OSA (obstructive sleep apnea) 05/13/2011   Split night study 2011:  AHI 21/hr with obstructive and central events.  Placed on cpap and titrated to 13cm with reasonable control, but attempts to increase pressure increased the number of central events.  Medium resmed quattro full face mask used.     Peripheral neuropathy    peripheral neuropathy   Phimosis/adherent prepuce 10/21/2013   Pneumonia    Prostate cancer (HCC)    Shortness of breath 11/05/2010   Qualifier: Diagnosis of  By: Annamary Rummage, RN, BSN, Jacquelyn     Sleep apnea    mild-no cpap, unable to tolerate   Squamous cell carcinoma of skin 10/29/2020   right mid volar forearm   Squamous cell carcinoma of skin 01/09/2023   Left forearm - [facility]   Umbilical hernia    Umbilical hernia without obstruction and without gangrene 05/19/2016   URI (upper respiratory infection) 04/27/2017   UTI (urinary tract infection) 12/01/2016   Varicose veins    Varicose veins of bilateral lower extremities with other complications 10/14/2016   Wheezing 01/01/2015    Past Surgical History:  Procedure Laterality Date   AMPUTATION TOE Right 04/01/2022   Procedure: AMPUTATION TOE-Right Great Toe Amputation;  Surgeon: Edwin Cap, DPM;  Location: [facility] ORS;  Service: Podiatry;  Laterality: Right;   ANGIOPLASTY     APPENDECTOMY     ARTERY BIOPSY Right 10/15/2021   Procedure: BIOPSY TEMPORAL ARTERY;  Surgeon: Sung Amabile, DO;  Location: [facility] ORS;  Service: General;  Laterality: Right;   BACK SURGERY     x3   CARDIAC CATHETERIZATION     11'11   CIRCUMCISION N/A 10/21/2013   Procedure: CIRCUMCISION ADULT;  Surgeon: Valetta Fuller, MD;  Location: [facility] ORS;  Service: Urology;   Laterality: N/A;   COLONOSCOPY WITH PROPOFOL N/A 06/09/2021   Procedure: COLONOSCOPY WITH PROPOFOL;  Surgeon: Wyline Mood, MD;  Location: [facility] ENDOSCOPY;  Service: Gastroenterology;  Laterality: N/A;   CYSTOSCOPY N/A 10/21/2013   Procedure: CYSTOSCOPY FLEXIBLE;  Surgeon: Valetta Fuller, MD;  Location: [facility] ORS;  Service: Urology;  Laterality: N/A;   CYSTOSCOPY W/ URETERAL STENT PLACEMENT Right 06/01/2020   Procedure: CYSTOSCOPY WITH RETROGRADE PYELOGRAM/URETERAL STENT Exchange;  Surgeon: Vanna Scotland, MD;  Location: [facility] ORS;  Service: Urology;  Laterality: Right;   CYSTOSCOPY WITH BIOPSY Right 01/06/2020   Procedure: CYSTOSCOPY WITH BIOPSY;  Surgeon: Vanna Scotland, MD;  Location: [facility] ORS;  Service: Urology;  Laterality: Right;   CYSTOSCOPY/URETEROSCOPY/HOLMIUM LASER/STENT PLACEMENT Right 01/06/2020   Procedure: CYSTOSCOPY/URETEROSCOPY/HOLMIUM LASER/STENT PLACEMENT;  Surgeon: Vanna Scotland, MD;  Location: [facility] ORS;  Service: Urology;  Laterality: Right;   ESOPHAGOGASTRODUODENOSCOPY N/A 02/03/2021   Procedure: ESOPHAGOGASTRODUODENOSCOPY (EGD);  Surgeon: Wyline Mood, MD;  Location: [facility] ENDOSCOPY;  Service: Gastroenterology;  Laterality: N/A;   ESOPHAGOGASTRODUODENOSCOPY (EGD) WITH PROPOFOL N/A 03/09/2021   Procedure: ESOPHAGOGASTRODUODENOSCOPY (EGD) WITH PROPOFOL;  Surgeon: Wyline Mood, MD;  Location: [facility] END[facility]COPY;  Service: Gastroenterology;  Laterality: N/A;   HERNIA REPAIR     INSERTION OF MESH N/A 06/17/2016   Procedure: INSERTION OF MESH;  Surgeon: Glenna Fellows, MD;  Location: [facility];  Service: General;  Laterality: N/A;   UMBILICAL HERNIA REPAIR N/A 06/17/2016   Procedure: REPAIR UMBILICAL HERNIA WITH MESH;  Surgeon: Glenna Fellows, MD;  Location: [facility];  Service: General;  Laterality: N/A;   WRIST SURGERY Right     Family History  Problem Relation Age of Onset   Prostate cancer Father    Emphysema Father    Hyperlipidemia Father    Hyperlipidemia Mother     Social History    Socioeconomic History   Marital status: Married    Spouse name: Not on file   Number of children: Not on file   Years of education: Not on file   Highest education level: Not on file  Occupational History   Occupation: retired Naval architect  Tobacco Use   Smoking status: Former    Current packs/day: 0.00    Average packs/day: 0.5 packs/day for 40.0 years (20.0 ttl pk-yrs)    Types: Cigarettes    Start date: 12/27/1955    Quit date: 12/27/1995    Years since quitting: 27.6   Smokeless tobacco: Never  Vaping Use   Vaping status: Never Used  Substance and Sexual Activity   Alcohol use: No   Drug use: No   Sexual activity: Yes  Other Topics Concern   Not on file  Social History Narrative   Not on file   Social Determinants of Health   Financial Resource Strain: High Risk (05/13/2023)   Received from [facility], [facility]   Overall Financial Resource Strain (CARDIA)    Difficulty of Paying Living Expenses: Hard  Food Insecurity: No Food Insecurity (08/11/2023)  Hunger Vital Sign    Worried About Running Out of Food in the Last Year: Never true    Ran Out of Food in the Last Year: Never true  Transportation Needs: No Transportation Needs (08/11/2023)   PRAPARE - Administrator, Civil Service (Medical): No    Lack of Transportation (Non-Medical): No  Physical Activity: Not on file  Stress: Not on file  Social Connections: Not on file    No current facility-administered medications on file prior to encounter.   Current Outpatient Medications on File Prior to Encounter  Medication Sig Dispense Refill   albuterol (VENTOLIN HFA) 108 (90 Base) MCG/ACT inhaler INHALE 2 PUFFS INTO THE LUNGS 4 TIMES DAILY AS NEEDED 8.5 g 11   amLODipine (NORVASC) 2.5 MG tablet TAKE 1 TABLET BY MOUTH DAILY 90 tablet 2   aspirin 81 MG tablet Take 81 mg by mouth daily.     blood glucose meter kit and supplies KIT Dispense based on patient and  insurance preference. Use up to four times daily as directed. 1 each 0   cephALEXin (KEFLEX) 500 MG capsule Take 3,000 mg by mouth daily.     citalopram (CELEXA) 20 MG tablet Take 1 tablet (20 mg total) by mouth daily. 90 tablet 3   DULoxetine (CYMBALTA) 60 MG capsule Take 60 mg by mouth 2 (two) times daily.      furosemide (LASIX) 40 MG tablet 1 tab by mouth once per day as needed 90 tablet 3   omeprazole (PRILOSEC) 20 MG capsule TAKE ONE CAPSULE BY MOUTH TWICE A DAY 180 capsule 3   oxycodone (ROXICODONE) 30 MG immediate release tablet 1 tab by mouth 5 times per day 30 tablet 0   oxyCODONE-acetaminophen (PERCOCET) 10-325 MG tablet Take 2 tablets by mouth every 4 (four) hours as needed for pain.     potassium chloride (KLOR-CON 10) 10 MEQ tablet 1 tab by mouth once daily when taking lasix 90 tablet 3   simvastatin (ZOCOR) 40 MG tablet TAKE 1 TABLET BY MOUTH AT BEDTIME 90 tablet 1   traZODone (DESYREL) 50 MG tablet Take 1 tablet (50 mg total) by mouth at bedtime as needed for sleep. 30 tablet 0    Review of Systems: a complete, 10pt review of systems was completed with pertinent positives and negatives as documented in the HPI  Physical Exam: There were no vitals filed for this visit. Gen: A&Ox3, no distress  Eyes: lids and conjunctivae normal, no icterus. Pupils equally round and reactive to light.  Neck: supple without mass or thyromegaly Chest: respiratory effort is normal.   Cardiovascular: RRR with palpable distal pulses, no pedal edema Gastrointestinal: soft, nondistended, nontender. No mass, hepatomegaly or splenomegaly.  Well-healed right lower quadrant incision, low midline incision and will the laparoscopic port site incisions. Muscoloskeletal: no clubbing or cyanosis of the fingers.  Strength is symmetrical throughout.  Range of motion of bilateral upper and lower extremities normal without pain, crepitation or contracture. Neuro: cranial nerves grossly intact.  Sensation intact to  light touch diffusely. Psych: appropriate mood and affect, normal insight/judgment intact  Skin: warm and dry      Latest Ref Rng & Units 08/10/2023   10:48 PM 07/05/2023   10:50 AM 01/16/2023   10:45 AM  CBC  WBC 4.0 - 10.5 K/uL 8.3  6.0  5.0   Hemoglobin 13.0 - 17.0 g/dL 57.8  46.9  62.9   Hematocrit 39.0 - 52.0 % 31.9  38.0  34.2  Platelets 150 - 400 K/uL 133  175.0  211        Latest Ref Rng & Units 08/10/2023   10:48 PM 07/05/2023   10:50 AM 01/16/2023   10:45 AM  CMP  Glucose 70 - 99 mg/dL 161  096  045   BUN 8 - 23 mg/dL 19  12  10    Creatinine 0.61 - 1.24 mg/dL 4.09  8.11  9.14   Sodium 135 - 145 mmol/L 137  143  146   Potassium 3.5 - 5.1 mmol/L 3.9  5.1  3.6   Chloride 98 - 111 mmol/L 104  104  103   CO2 22 - 32 mmol/L 24  28  30    Calcium 8.9 - 10.3 mg/dL 7.8  9.5  8.5   Total Protein 6.5 - 8.1 g/dL 5.8  6.4    Total Bilirubin 0.3 - 1.2 mg/dL 2.3  0.5    Alkaline Phos 38 - 126 U/L 203  175    AST 15 - 41 U/L 299  24    ALT 0 - 44 U/L 166  46      Lab Results  Component Value Date   INR 1.1 08/10/2023   INR 1.0 10/14/2021    Imaging: MR ABDOMEN MRCP W WO CONTAST  Result Date: 08/11/2023 CLINICAL DATA:  Biliary dilatation with elevated LFTs. EXAM: MRI ABDOMEN WITHOUT AND WITH CONTRAST (INCLUDING MRCP) TECHNIQUE: Multiplanar multisequence MR imaging of the abdomen was performed both before and after the administration of intravenous contrast. Heavily T2-weighted images of the biliary and pancreatic ducts were obtained, and three-dimensional MRCP images were rendered by post processing. CONTRAST:  8mL GADAVIST GADOBUTROL 1 MMOL/ML IV SOLN COMPARISON:  CT scan from earlier same day FINDINGS: Lower chest: Unremarkable. Hepatobiliary: No suspicious focal abnormality within the liver parenchyma. No gallbladder wall thickening or pericholecystic fluid. Several very tiny 1-2 mm stones are seen in the dependent gallbladder lumen towards the neck (see axial T2 image 14 of  series 9. MRCP imaging is markedly motion degraded. Common duct measures 9 mm diameter. Common bile duct in the head of the pancreas is 7-8 mm diameter. Potential 2 mm stone in the distal common bile duct just proximal to the ampulla (see axial T2 image 19 of series 9). Pancreas: No main duct dilatation. No focal mass lesion. Axial T2 haste imaging raises the question of some mild peripancreatic edema in the head and body region. Spleen:  No splenomegaly. No suspicious focal mass lesion. Adrenals/Urinary Tract: No adrenal nodule or mass. No suspicious enhancing renal mass. Mild right hydroureteronephrosis. Stomach/Bowel: Stomach is moderately distended with food and fluid. Duodenum is normally positioned as is the ligament of Treitz. No small bowel or colonic dilatation within the visualized abdomen. Vascular/Lymphatic: No abdominal aortic aneurysm. 14 mm short axis hepatoduodenal ligament lymph node seen on 19/4, presumably reactive. 14 mm short axis celiac axis node seen on 16/4. Other:  No intraperitoneal free fluid. Musculoskeletal: No focal suspicious marrow enhancement within the visualized bony anatomy. IMPRESSION: 1. MRCP imaging is markedly motion degraded. Common bile duct in the head of the pancreas measures 7-8 mm diameter. Potential 2 mm stone in the distal common bile duct just proximal to the ampulla. 2. Several very tiny 1-2 mm stones in the dependent gallbladder lumen towards the neck. No gallbladder wall thickening or pericholecystic fluid. 3. Question mild peripancreatic edema in the head and body region. Correlation with lipase recommended. 4. Mild right hydroureteronephrosis. Etiology for this finding is not  evident on the current abdominal MRI. 5. Mildly enlarged hepatoduodenal ligament and celiac axis lymph nodes, presumably reactive. Consider follow-up CT in 3 months to ensure stability. Electronically Signed   By: Kennith Center M.D.   On: 08/11/2023 06:29   MR 3D Recon At Scanner  Result  Date: 08/11/2023 CLINICAL DATA:  Biliary dilatation with elevated LFTs. EXAM: MRI ABDOMEN WITHOUT AND WITH CONTRAST (INCLUDING MRCP) TECHNIQUE: Multiplanar multisequence MR imaging of the abdomen was performed both before and after the administration of intravenous contrast. Heavily T2-weighted images of the biliary and pancreatic ducts were obtained, and three-dimensional MRCP images were rendered by post processing. CONTRAST:  8mL GADAVIST GADOBUTROL 1 MMOL/ML IV SOLN COMPARISON:  CT scan from earlier same day FINDINGS: Lower chest: Unremarkable. Hepatobiliary: No suspicious focal abnormality within the liver parenchyma. No gallbladder wall thickening or pericholecystic fluid. Several very tiny 1-2 mm stones are seen in the dependent gallbladder lumen towards the neck (see axial T2 image 14 of series 9. MRCP imaging is markedly motion degraded. Common duct measures 9 mm diameter. Common bile duct in the head of the pancreas is 7-8 mm diameter. Potential 2 mm stone in the distal common bile duct just proximal to the ampulla (see axial T2 image 19 of series 9). Pancreas: No main duct dilatation. No focal mass lesion. Axial T2 haste imaging raises the question of some mild peripancreatic edema in the head and body region. Spleen:  No splenomegaly. No suspicious focal mass lesion. Adrenals/Urinary Tract: No adrenal nodule or mass. No suspicious enhancing renal mass. Mild right hydroureteronephrosis. Stomach/Bowel: Stomach is moderately distended with food and fluid. Duodenum is normally positioned as is the ligament of Treitz. No small bowel or colonic dilatation within the visualized abdomen. Vascular/Lymphatic: No abdominal aortic aneurysm. 14 mm short axis hepatoduodenal ligament lymph node seen on 19/4, presumably reactive. 14 mm short axis celiac axis node seen on 16/4. Other:  No intraperitoneal free fluid. Musculoskeletal: No focal suspicious marrow enhancement within the visualized bony anatomy. IMPRESSION: 1.  MRCP imaging is markedly motion degraded. Common bile duct in the head of the pancreas measures 7-8 mm diameter. Potential 2 mm stone in the distal common bile duct just proximal to the ampulla. 2. Several very tiny 1-2 mm stones in the dependent gallbladder lumen towards the neck. No gallbladder wall thickening or pericholecystic fluid. 3. Question mild peripancreatic edema in the head and body region. Correlation with lipase recommended. 4. Mild right hydroureteronephrosis. Etiology for this finding is not evident on the current abdominal MRI. 5. Mildly enlarged hepatoduodenal ligament and celiac axis lymph nodes, presumably reactive. Consider follow-up CT in 3 months to ensure stability. Electronically Signed   By: Kennith Center M.D.   On: 08/11/2023 06:29   MR Lumbar Spine W Wo Contrast  Result Date: 08/11/2023 CLINICAL DATA:  MSSA seeding of hardware. EXAM: MRI LUMBAR SPINE WITHOUT AND WITH CONTRAST TECHNIQUE: Multiplanar and multiecho pulse sequences of the lumbar spine were obtained without and with intravenous contrast. CONTRAST:  8mL GADAVIST GADOBUTROL 1 MMOL/ML IV SOLN COMPARISON:  05/05/2023 FINDINGS: Segmentation:  Standard. Alignment:  Minimal dextrocurvature Vertebrae: L4-5 and L5-S1 fusion with solid intervertebral arthrodesis. Minor marrow edema in the endplates at L3-4, likely degenerative in the absence of paravertebral edema or disc T2 hyperintensity and in the presence of vacuum phenomenon by prior CT. Postcontrast assessment is limited by metal artifact. Conus medullaris and cauda equina: Conus extends to the L2 level. Conus and cauda equina appear normal. Paraspinal and other soft tissues:  Full urinary bladder and right hydronephrosis. The right ureter appears reimplanted and this may be from stricture or reflux. There is contemporaneous abdominal CT. Disc levels: T12- L1: Ventral spondylitic spurring. L1-L2: Disc narrowing and bulging with biforaminal protrusion. Bilateral facet spurring.  Moderate left and mild-to-moderate right foraminal narrowing. L2-L3: Circumferential disc bulging. Degenerative facet spurring on both sides. Mild bilateral foraminal narrowing. L3-L4: Disc degeneration with endplate ridging. Facet spurring asymmetric to the left. Moderate left more than right foraminal narrowing. Widely patent spinal canal after laminectomy. L4-L5: Fusion without impingement L5-S1:Fusion without impingement IMPRESSION: 1. No acute finding.  No clear sign of spinal infection. 2. Degenerated and postoperative lumbar spine with up to moderate foraminal narrowing at L1-2 and L3-4. Diffusely patent spinal canal. Electronically Signed   By: Tiburcio Pea M.D.   On: 08/11/2023 04:39   US ABDOMEN LIMITED RUQ (LIVER/GB)  Result Date: 08/11/2023 CLINICAL DATA:  Elevated LFTs EXAM: ULTRASOUND ABDOMEN LIMITED RIGHT UPPER QUADRANT COMPARISON:  CT from earlier in the same day. FINDINGS: Gallbladder: Gallbladder is well distended. No wall thickening or pericholecystic fluid is noted. Small dependent gallstones are seen. Negative sonographic Murphy's sign is elicited. Common bile duct: Diameter: 12 mm tapering to 7 mm. No intrahepatic ductal dilatation is seen. Liver: No focal lesion identified. Within normal limits in parenchymal echogenicity. Portal vein is patent on color Doppler imaging with normal direction of blood flow towards the liver. Other: None. IMPRESSION: Prominent common bile duct. Distal common bile duct stone could not be totally excluded. Cholelithiasis without complicating factors. Electronically Signed   By: Alcide Clever M.D.   On: 08/11/2023 02:53   CT ABDOMEN PELVIS W CONTRAST  Result Date: 08/11/2023 CLINICAL DATA:  Abdominal pain, acute, nonlocalized fever, abd pain EXAM: CT ABDOMEN AND PELVIS WITH CONTRAST TECHNIQUE: Multidetector CT imaging of the abdomen and pelvis was performed using the standard protocol following bolus administration of intravenous contrast. RADIATION DOSE  REDUCTION: This exam was performed according to the departmental dose-optimization program which includes automated exposure control, adjustment of the mA and/or kV according to patient size and/or use of iterative reconstruction technique. CONTRAST:  OMNIPAQUE IOHEXOL 300 MG/ML  SOLN COMPARISON:  CT lumbar spine 02/23/2023, CT abdomen pelvis 10/14/2021 FINDINGS: Lower chest: No acute abnormality. Hepatobiliary: No focal liver abnormality. No gallstones, gallbladder wall thickening, or pericholecystic fluid. No biliary dilatation. Pancreas: Diffusely atrophic. No focal lesion. Otherwise normal pancreatic contour. No surrounding inflammatory changes. No main pancreatic ductal dilatation. Spleen: Splenule noted normal in size without focal abnormality. Adrenals/Urinary Tract: No adrenal nodule bilaterally. Bilateral kidneys enhance symmetrically. Mild right hydroureteronephrosis with abrupt change in caliber at the distal ureter (2:55). No right nephroureterolithiasis. No right hydroureteronephrosis. The urinary bladder is unremarkable. On delayed imaging, there is no urothelial wall thickening and there are no filling defects in the opacified portions of the bilateral collecting systems or ureters. Stomach/Bowel: Stomach is within normal limits. No evidence of bowel wall thickening or dilatation. The appendix is not definitely identified with no inflammatory changes in the right lower quadrant to suggest acute appendicitis. Vascular/Lymphatic: No abdominal aorta or iliac aneurysm. Moderate to severe atherosclerotic plaque of the aorta and its branches. Prominent but nonenlarged mesenteric and retroperitoneal lymphadenopathy with vague small bowel mesenteric haziness. No abdominal, pelvic, or inguinal lymphadenopathy. Reproductive: Prostate is unremarkable. Other: No intraperitoneal free fluid. No intraperitoneal free gas. No organized fluid collection. Musculoskeletal: No abdominal wall hernia or abnormality.  Diffusely decreased bone density. No suspicious lytic or blastic osseous lesions. No acute displaced  fracture. L4-L5 and L5-S1 interbody surgical hardware. IMPRESSION: 1. Mild right hydroureteronephrosis with abrupt change in caliber at the distal ureter. No nephroureterolithiasis. This may reflect the residual of a recently passed calculus or reflect chronic changes of obstructive uropathy or reflux. 2. Diffusely decreased bone density. 3. Nonspecific small bowel misty mesentery. Electronically Signed   By: Tish Frederickson M.D.   On: 08/11/2023 01:40   DG Chest Port 1 View  Result Date: 08/10/2023 CLINICAL DATA:  Fever history of liver and kidney disease EXAM: PORTABLE CHEST 1 VIEW COMPARISON:  08/23/2022 FINDINGS: Borderline to mild cardiomegaly. No acute airspace disease or effusion. No pneumothorax. IMPRESSION: No active disease. Borderline to mild cardiomegaly. Electronically Signed   By: Jasmine Pang M.D.   On: 08/10/2023 23:27     A/P: Choledocholithiasis -Currently scheduled for ERCP tomorrow with GI -Pending successful clearance of the common bile duct I recommend proceeding with laparoscopic cholecystectomy with possible cholangiogram artery discharge.  I discussed the relevant anatomy and surgical technique with the patient and his wife at the bedside, and we discussed risks of surgery including bleeding, infection, pain, scarring, intraabdominal injury specifically to the common bile duct and sequelae, bile leak, conversion to open surgery, subtotal cholecystectomy, blood clot, pneumonia, heart attack, stroke, failure to resolve symptoms, etc. Questions welcomed and answered.  He is agreeable to this plan.  We will follow-up tomorrow and potentially plan for surgery on Sunday with Dr. Dossie Der today.    Patient Active Problem List   Diagnosis Date Noted   Cholangitis 08/11/2023   Anxiety and depression 07/07/2023   B12 deficiency 07/05/2023   Chronic venous insufficiency 05/02/2023    Cellulitis of right foot 04/01/2022   Acute osteomyelitis of toe of right foot (HCC) 03/31/2022   Overweight (BMI 25.0-29.9) 03/31/2022   Abnormal LFTs 10/14/2021   Chronic diastolic CHF (congestive heart failure) (HCC) 10/14/2021   COPD (chronic obstructive pulmonary disease) (HCC)    CAD (coronary artery disease)    Acute metabolic encephalopathy    Hypokalemia    HTN (hypertension)    Elevated lactic acid level    Body aches 10/13/2021   GI obstruction (HCC) 02/15/2021   RUQ pain 11/09/2020   Ureteral stricture, right 06/01/2020   Weight loss 10/08/2019   Abdominal pain 10/08/2019   Therapeutic opioid induced constipation 10/08/2019   Fever 04/27/2017   URI (upper respiratory infection) 04/27/2017   Slowing, urinary stream 04/27/2017   Confusion 04/27/2017   Insomnia 04/27/2017   Prostate cancer (HCC) 01/27/2017   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/2016   Lumbosacral radiculopathy 10/14/2016   Varicose veins of bilateral lower extremities with other complications 10/14/2016   Umbilical hernia without obstruction and without gangrene 05/19/2016   Hyperglycemia 05/19/2016   Acute upper respiratory infection 05/19/2016   Gynecomastia 05/12/2015   Encounter for preventative adult health care exam with abnormal findings 12/20/2013   GERD (gastroesophageal reflux disease)    Anxiety    Chronic back pain    Depression with anxiety    Benign prostatic hyperplasia    Peripheral neuropathy    Phimosis/adherent prepuce 10/21/2013   OSA (obstructive sleep apnea) 05/13/2011   Coronary atherosclerosis 10/16/2009   HLD (hyperlipidemia) 04/29/2008   MYOCARDIAL INFARCTION 04/29/2008   BRONCHITIS, CHRONIC 04/29/2008       Phylliss Blakes, MD [facility]  See AMION to contact appropriate on-call provider

## 2023-08-11 NOTE — H&P (Signed)
TRH H&P   Patient Demographics:    Albert Wade, is a 77 y.o. male  [name]: [id number]   DOB - 1946-06-16  Admit Date - 08/11/2023  Outpatient Primary MD for the patient is Corwin Levins, MD  Outpatient Specialists: Neurosurgeon and ID at [facility]  Patient coming from: [facility] ER  CC known past medical history of L-spine hardware infection for the last 1 year     HPI:    Albert Wade  is a 77 y.o. 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  He was seen by GI at [facility] and then transferred to [facility] for further management of choledocholithiasis, he was accepted by [facility] here.  Patient currently has no headache, does have history of fevers for the last 2 to 3 days, no chest pain or shortness of breath, no cough, right upper quadrant abdominal pain which is constant worse with activity or cough better with rest, no diarrhea or dysuria, no blood in stool or urine, no focal weakness.    Review of systems:    A full 10 point Review of Systems was done, except as stated above, all other Review of Systems were negative.   With Past History of the following :    Past Medical History:  Diagnosis Date   Acute bronchitis 01/01/2015   Acute upper  respiratory infection 05/19/2016   Anginal pain (HCC)    Anxiety    Arthritis    back,wrists,hx. previous fractures   BPH (benign prostatic hypertrophy)    BRONCHITIS, CHRONIC 04/29/2008   Qualifier: Diagnosis of  By: Craige Cotta MD, Vineet     CAD 10/16/2009   Qualifier: Diagnosis of  By: Oswald Hillock     CAD (coronary artery disease)    angioplasty    Chronic back pain    Chronic bronchitis    Chronic low back pain with  sciatica 10/14/2016   Confusion 04/27/2017   COPD (chronic obstructive pulmonary disease) (HCC)    Depression    Encounter for preventative adult health care exam with abnormal findings 12/20/2013   Fever 04/27/2017   GERD (gastroesophageal reflux disease)    Gynecomastia 05/12/2015   H/O hiatal hernia    pts wife states pt does not have    Heart murmur    History of kidney stones    HTN (hypertension)    Hypercholesteremia    Hyperglycemia 05/19/2016   HYPERLIPIDEMIA 04/29/2008   Qualifier: Diagnosis of  By: Marinus Maw     Impaired glucose tolerance 12/20/2013   Insomnia 04/27/2017   Lumbosacral radiculopathy 10/14/2016   MI (myocardial infarction) [place])    mid-'90's   MYOCARDIAL INFARCTION 04/29/2008   Qualifier: History of  By: Marinus Maw     OSA (obstructive sleep apnea) 05/13/2011   Split night study 2011:  AHI 21/hr with obstructive and central events.  Placed on cpap and titrated to 13cm with reasonable control, but attempts to increase pressure increased the number of central events.  Medium resmed quattro full face mask used.     Peripheral neuropathy    peripheral neuropathy   Phimosis/adherent prepuce 10/21/2013   Pneumonia    Prostate cancer (HCC)    Shortness of breath 11/05/2010   Qualifier: Diagnosis of  By: Annamary Rummage, RN, BSN, Jacquelyn     Sleep apnea    mild-no cpap, unable to tolerate   Squamous cell carcinoma of skin 10/29/2020   right mid volar forearm   Squamous cell carcinoma of skin 01/09/2023   Left  forearm - [facility]   Umbilical hernia    Umbilical hernia without obstruction and without gangrene 05/19/2016   URI (upper respiratory infection) 04/27/2017   UTI (urinary tract infection) 12/01/2016   Varicose veins    Varicose veins of bilateral lower extremities with other complications 10/14/2016   Wheezing 01/01/2015      Past Surgical History:  Procedure Laterality Date   AMPUTATION TOE Right 04/01/2022   Procedure: AMPUTATION TOE-Right Great Toe Amputation;  Surgeon: Edwin Cap, DPM;  Location: [facility] ORS;  Service: Podiatry;  Laterality: Right;   ANGIOPLASTY     APPENDECTOMY     ARTERY BIOPSY Right 10/15/2021   Procedure: BIOPSY TEMPORAL ARTERY;  Surgeon: Sung Amabile, DO;  Location: [facility] ORS;  Service: General;  Laterality: Right;   BACK SURGERY     x3   CARDIAC CATHETERIZATION     11'11   CIRCUMCISION N/A 10/21/2013   Procedure: CIRCUMCISION ADULT;  Surgeon: Valetta Fuller, MD;  Location: [facility]S;  Service: Urology;  Laterality: N/A;   COLONOSCOPY WITH PROPOFOL N/A 06/09/2021   Procedure: COLONOSCOPY WITH PROPOFOL;  Surgeon: Wyline Mood, MD;  Location: [facility] ENDOSCOPY;  Service: Gastroenterology;  Laterality: N/A;   CYSTOSCOPY N/A 10/21/2013   Procedure: CYSTOSCOPY FLEXIBLE;  Surgeon: Valetta Fuller, MD;  Location: [facility] ORS;  Service: Urology;  Laterality: N/A;   CYSTOSCOPY W/ URETERAL STENT PLACEMENT Right 06/01/2020   Procedure: CYSTOSCOPY WITH RETROGRADE PYELOGRAM/URETERAL STENT Exchange;  Surgeon: Vanna Scotland, MD;  Location: [facility] ORS;  Service: Urology;  Laterality: Right;   CYSTOSCOPY WITH BIOPSY Right 01/06/2020   Procedure: CYSTOSCOPY WITH BIOPSY;  Surgeon: Vanna Scotland, MD;  Location: [facility] ORS;  Service: Urology;  Laterality: Right;   CYSTOSCOPY/URETEROSCOPY/HOLMIUM LASER/STENT PLACEMENT Right 01/06/2020   Procedure: CYSTOSCOPY/URETEROSCOPY/HOLMIUM LASER/STENT PLACEMENT;  Surgeon: Vanna Scotland, MD;  Location: [facility] ORS;  Service: Urology;  Laterality: Right;  ESOPHAGOGASTRODUODENOSCOPY N/A 02/03/2021   Procedure: ESOPHAGOGASTRODUODENOSCOPY (EGD);  Surgeon: Wyline Mood, MD;  Location: [facility] ENDOSCOPY;  Service: Gastroenterology;  Laterality: N/A;   ESOPHAGOGASTRODUODENOSCOPY (EGD) WITH PROPOFOL N/A 03/09/2021   Procedure: ESOPHAGOGASTRODUODENOSCOPY (EGD) WITH PROPOFOL;  Surgeon: Wyline Mood, MD;  Location: [facility] ENDOSCOPY;  Service: Gastroenterology;  Laterality: N/A;   HERNIA REPAIR     INSERTION OF MESH N/A 06/17/2016   Procedure: INSERTION OF MESH;  Surgeon: Glenna Fellows, MD;  Location: [facility];  Service: General;  Laterality: N/A;   UMBILICAL HERNIA REPAIR N/A 06/17/2016   Procedure: REPAIR UMBILICAL HERNIA WITH MESH;  Surgeon: Glenna Fellows, MD;  Location: [facility];  Service: General;  Laterality: N/A;   WRIST SURGERY Right       Social History:     Social History   Tobacco Use   Smoking status: Former    Current packs/day: 0.00    Average packs/day: 0.5 packs/day for 40.0 years (20.0 ttl pk-yrs)    Types: Cigarettes    Start date: 12/27/1955    Quit date: 12/27/1995    Years since quitting: 27.6   Smokeless tobacco: Never  Substance Use Topics   Alcohol use: No         Family History :     Family History  Problem Relation Age of Onset   Prostate cancer Father    Emphysema Father    Hyperlipidemia Father    Hyperlipidemia Mother        Home Medications:   Prior to Admission medications   Medication Sig Start Date End Date Taking? Authorizing Provider  albuterol (VENTOLIN HFA) 108 (90 Base) MCG/ACT inhaler INHALE 2 PUFFS INTO THE LUNGS 4 TIMES DAILY AS NEEDED 07/05/23   Corwin Levins, MD  amLODipine (NORVASC) 2.5 MG tablet TAKE 1 TABLET BY MOUTH DAILY 05/26/23   Pricilla Riffle, MD  aspirin 81 MG tablet Take 81 mg by mouth daily.    [provider]  blood glucose meter kit and supplies KIT Dispense based on patient and insurance preference. Use up to four times daily as directed. 10/16/21   Pennie Banter, DO   cephALEXin (KEFLEX) 500 MG capsule Take 3,000 mg by mouth daily.    [provider]  citalopram (CELEXA) 20 MG tablet Take 1 tablet (20 mg total) by mouth daily. 07/25/23   Corwin Levins, MD  DULoxetine (CYMBALTA) 60 MG capsule Take 60 mg by mouth 2 (two) times daily.  04/23/20   [provider]  furosemide (LASIX) 40 MG tablet 1 tab by mouth once per day as needed 07/05/23   Corwin Levins, MD  omeprazole (PRILOSEC) 20 MG capsule TAKE ONE CAPSULE BY MOUTH TWICE A DAY 05/02/23   Wyline Mood, MD  oxycodone (ROXICODONE) 30 MG immediate release tablet 1 tab by mouth 5 times per day 07/05/23   Corwin Levins, MD  oxyCODONE-acetaminophen (PERCOCET) 10-325 MG tablet Take 2 tablets by mouth every 4 (four) hours as needed for pain.    [provider]  potassium chloride (KLOR-CON 10) 10 MEQ tablet 1 tab by mouth once daily when taking lasix 07/05/23   Corwin Levins, MD  simvastatin (ZOCOR) 40 MG tablet TAKE 1 TABLET BY MOUTH AT BEDTIME 07/13/23   Pricilla Riffle, MD  traZODone (DESYREL) 50 MG tablet Take 1 tablet (50 mg total) by mouth at bedtime as needed for sleep. 10/16/21   Pennie Banter, DO     Allergies:     Allergies  Allergen  Reactions   Cyclobenzaprine     agitation    Seroquel [Quetiapine] Hives     Physical Exam:   Vitals  Height 5' 10.51" (1.791 m), weight 82 kg.   1. General Elderly white gentleman lying in hospital bed in no apparent distress  2. Normal affect and insight, Not Suicidal or Homicidal, Awake Alert,   3. No F.N deficits, ALL C.Nerves Intact, Strength 5/5 all 4 extremities, Sensation intact all 4 extremities, Plantars down going.  4. Ears and Eyes appear Normal, Conjunctivae clear, PERRLA. Moist Oral Mucosa.  5. Supple Neck, No JVD, No cervical lymphadenopathy appriciated, No Carotid Bruits.  6. Symmetrical Chest wall movement, Good air movement bilaterally, CTAB.  7. RRR, No Gallops, Rubs or Murmurs, No Parasternal Heave.  8. Positive  Bowel Sounds, Abdomen Soft, positive right upper quadrant tenderness, No organomegaly appriciated,No rebound -guarding or rigidity.  9.  No Cyanosis, Normal Skin Turgor, No Skin Rash or Bruise.  10. Good muscle tone,  joints appear normal , no effusions, Normal ROM.  11. No Palpable Lymph Nodes in Neck or Axillae      Data Review:   Recent Labs  Lab 08/10/23 2248  WBC 8.3  HGB 10.1*  HCT 31.9*  PLT 133*  MCV 86.4  MCH 27.4  MCHC 31.7  RDW 15.8*  LYMPHSABS 0.3*  MONOABS 0.5  EOSABS 0.0  BASOSABS 0.0    Recent Labs  Lab 08/10/23 2248 08/11/23 0048  NA 137  --   K 3.9  --   CL 104  --   CO2 24  --   ANIONGAP 9  --   GLUCOSE 125*  --   BUN 19  --   CREATININE 1.01  --   AST 299*  --   ALT 166*  --   ALKPHOS 203*  --   BILITOT 2.3*  --   ALBUMIN 3.1*  --   LATICACIDVEN  --  1.3  INR 1.1  --   CALCIUM 7.8*  --     Lab Results  Component Value Date   CHOL 134 07/05/2023   HDL 44.50 07/05/2023   LDLCALC 70 07/05/2023   LDLDIRECT 78.0 05/19/2016   TRIG 96.0 07/05/2023   CHOLHDL 3 07/05/2023    Recent Labs  Lab 08/10/23 2248 08/11/23 0048  LATICACIDVEN  --  1.3  INR 1.1  --   CALCIUM 7.8*  --     Recent Labs  Lab 08/10/23 2248 08/11/23 0048  WBC 8.3  --   PLT 133*  --   LATICACIDVEN  --  1.3  CREATININE 1.01  --     Urinalysis    Component Value Date/Time   COLORURINE AMBER (A) 08/10/2023 2248   APPEARANCEUR CLEAR (A) 08/10/2023 2248   APPEARANCEUR Cloudy (A) 07/14/2020 1349   LABSPEC 1.015 08/10/2023 2248   PHURINE 5.0 08/10/2023 2248   GLUCOSEU NEGATIVE 08/10/2023 2248   GLUCOSEU NEGATIVE 07/05/2023 1050   HGBUR NEGATIVE 08/10/2023 2248   BILIRUBINUR NEGATIVE 08/10/2023 2248   BILIRUBINUR negative 05/30/2022 1023   BILIRUBINUR Negative 07/14/2020 1349   KETONESUR NEGATIVE 08/10/2023 2248   PROTEINUR NEGATIVE 08/10/2023 2248   UROBILINOGEN 0.2 07/05/2023 1050   NITRITE NEGATIVE 08/10/2023 2248   LEUKOCYTESUR NEGATIVE 08/10/2023  2248      Imaging Results:    MR ABDOMEN MRCP W WO CONTAST  Result Date: 08/11/2023 CLINICAL DATA:  Biliary dilatation with elevated LFTs. EXAM: MRI ABDOMEN WITHOUT AND WITH CONTRAST (INCLUDING MRCP) TECHNIQUE: Multiplanar multisequence MR imaging  of the abdomen was performed both before and after the administration of intravenous contrast. Heavily T2-weighted images of the biliary and pancreatic ducts were obtained, and three-dimensional MRCP images were rendered by post processing. CONTRAST:  8mL GADAVIST GADOBUTROL 1 MMOL/ML IV SOLN COMPARISON:  CT scan from earlier same day FINDINGS: Lower chest: Unremarkable. Hepatobiliary: No suspicious focal abnormality within the liver parenchyma. No gallbladder wall thickening or pericholecystic fluid. Several very tiny 1-2 mm stones are seen in the dependent gallbladder lumen towards the neck (see axial T2 image 14 of series 9. MRCP imaging is markedly motion degraded. Common duct measures 9 mm diameter. Common bile duct in the head of the pancreas is 7-8 mm diameter. Potential 2 mm stone in the distal common bile duct just proximal to the ampulla (see axial T2 image 19 of series 9). Pancreas: No main duct dilatation. No focal mass lesion. Axial T2 haste imaging raises the question of some mild peripancreatic edema in the head and body region. Spleen:  No splenomegaly. No suspicious focal mass lesion. Adrenals/Urinary Tract: No adrenal nodule or mass. No suspicious enhancing renal mass. Mild right hydroureteronephrosis. Stomach/Bowel: Stomach is moderately distended with food and fluid. Duodenum is normally positioned as is the ligament of Treitz. No small bowel or colonic dilatation within the visualized abdomen. Vascular/Lymphatic: No abdominal aortic aneurysm. 14 mm short axis hepatoduodenal ligament lymph node seen on 19/4, presumably reactive. 14 mm short axis celiac axis node seen on 16/4. Other:  No intraperitoneal free fluid. Musculoskeletal: No focal  suspicious marrow enhancement within the visualized bony anatomy. IMPRESSION: 1. MRCP imaging is markedly motion degraded. Common bile duct in the head of the pancreas measures 7-8 mm diameter. Potential 2 mm stone in the distal common bile duct just proximal to the ampulla. 2. Several very tiny 1-2 mm stones in the dependent gallbladder lumen towards the neck. No gallbladder wall thickening or pericholecystic fluid. 3. Question mild peripancreatic edema in the head and body region. Correlation with lipase recommended. 4. Mild right hydroureteronephrosis. Etiology for this finding is not evident on the current abdominal MRI. 5. Mildly enlarged hepatoduodenal ligament and celiac axis lymph nodes, presumably reactive. Consider follow-up CT in 3 months to ensure stability. Electronically Signed   By: Kennith Center M.D.   On: 08/11/2023 06:29   MR 3D Recon At Scanner  Result Date: 08/11/2023 CLINICAL DATA:  Biliary dilatation with elevated LFTs. EXAM: MRI ABDOMEN WITHOUT AND WITH CONTRAST (INCLUDING MRCP) TECHNIQUE: Multiplanar multisequence MR imaging of the abdomen was performed both before and after the administration of intravenous contrast. Heavily T2-weighted images of the biliary and pancreatic ducts were obtained, and three-dimensional MRCP images were rendered by post processing. CONTRAST:  8mL GADAVIST GADOBUTROL 1 MMOL/ML IV SOLN COMPARISON:  CT scan from earlier same day FINDINGS: Lower chest: Unremarkable. Hepatobiliary: No suspicious focal abnormality within the liver parenchyma. No gallbladder wall thickening or pericholecystic fluid. Several very tiny 1-2 mm stones are seen in the dependent gallbladder lumen towards the neck (see axial T2 image 14 of series 9. MRCP imaging is markedly motion degraded. Common duct measures 9 mm diameter. Common bile duct in the head of the pancreas is 7-8 mm diameter. Potential 2 mm stone in the distal common bile duct just proximal to the ampulla (see axial T2 image  19 of series 9). Pancreas: No main duct dilatation. No focal mass lesion. Axial T2 haste imaging raises the question of some mild peripancreatic edema in the head and body region. Spleen:  No  splenomegaly. No suspicious focal mass lesion. Adrenals/Urinary Tract: No adrenal nodule or mass. No suspicious enhancing renal mass. Mild right hydroureteronephrosis. Stomach/Bowel: Stomach is moderately distended with food and fluid. Duodenum is normally positioned as is the ligament of Treitz. No small bowel or colonic dilatation within the visualized abdomen. Vascular/Lymphatic: No abdominal aortic aneurysm. 14 mm short axis hepatoduodenal ligament lymph node seen on 19/4, presumably reactive. 14 mm short axis celiac axis node seen on 16/4. Other:  No intraperitoneal free fluid. Musculoskeletal: No focal suspicious marrow enhancement within the visualized bony anatomy. IMPRESSION: 1. MRCP imaging is markedly motion degraded. Common bile duct in the head of the pancreas measures 7-8 mm diameter. Potential 2 mm stone in the distal common bile duct just proximal to the ampulla. 2. Several very tiny 1-2 mm stones in the dependent gallbladder lumen towards the neck. No gallbladder wall thickening or pericholecystic fluid. 3. Question mild peripancreatic edema in the head and body region. Correlation with lipase recommended. 4. Mild right hydroureteronephrosis. Etiology for this finding is not evident on the current abdominal MRI. 5. Mildly enlarged hepatoduodenal ligament and celiac axis lymph nodes, presumably reactive. Consider follow-up CT in 3 months to ensure stability. Electronically Signed   By: Kennith Center M.D.   On: 08/11/2023 06:29   MR Lumbar Spine W Wo Contrast  Result Date: 08/11/2023 CLINICAL DATA:  MSSA seeding of hardware. EXAM: MRI LUMBAR SPINE WITHOUT AND WITH CONTRAST TECHNIQUE: Multiplanar and multiecho pulse sequences of the lumbar spine were obtained without and with intravenous contrast. CONTRAST:   8mL GADAVIST GADOBUTROL 1 MMOL/ML IV SOLN COMPARISON:  05/05/2023 FINDINGS: Segmentation:  Standard. Alignment:  Minimal dextrocurvature Vertebrae: L4-5 and L5-S1 fusion with solid intervertebral arthrodesis. Minor marrow edema in the endplates at L3-4, likely degenerative in the absence of paravertebral edema or disc T2 hyperintensity and in the presence of vacuum phenomenon by prior CT. Postcontrast assessment is limited by metal artifact. Conus medullaris and cauda equina: Conus extends to the L2 level. Conus and cauda equina appear normal. Paraspinal and other soft tissues: Full urinary bladder and right hydronephrosis. The right ureter appears reimplanted and this may be from stricture or reflux. There is contemporaneous abdominal CT. Disc levels: T12- L1: Ventral spondylitic spurring. L1-L2: Disc narrowing and bulging with biforaminal protrusion. Bilateral facet spurring. Moderate left and mild-to-moderate right foraminal narrowing. L2-L3: Circumferential disc bulging. Degenerative facet spurring on both sides. Mild bilateral foraminal narrowing. L3-L4: Disc degeneration with endplate ridging. Facet spurring asymmetric to the left. Moderate left more than right foraminal narrowing. Widely patent spinal canal after laminectomy. L4-L5: Fusion without impingement L5-S1:Fusion without impingement IMPRESSION: 1. No acute finding.  No clear sign of spinal infection. 2. Degenerated and postoperative lumbar spine with up to moderate foraminal narrowing at L1-2 and L3-4. Diffusely patent spinal canal. Electronically Signed   By: Tiburcio Pea M.D.   On: 08/11/2023 04:39   US ABDOMEN LIMITED RUQ (LIVER/GB)  Result Date: 08/11/2023 CLINICAL DATA:  Elevated LFTs EXAM: ULTRASOUND ABDOMEN LIMITED RIGHT UPPER QUADRANT COMPARISON:  CT from earlier in the same day. FINDINGS: Gallbladder: Gallbladder is well distended. No wall thickening or pericholecystic fluid is noted. Small dependent gallstones are seen. Negative  sonographic Murphy's sign is elicited. Common bile duct: Diameter: 12 mm tapering to 7 mm. No intrahepatic ductal dilatation is seen. Liver: No focal lesion identified. Within normal limits in parenchymal echogenicity. Portal vein is patent on color Doppler imaging with normal direction of blood flow towards the liver. Other: None. IMPRESSION: Prominent common  bile duct. Distal common bile duct stone could not be totally excluded. Cholelithiasis without complicating factors. Electronically Signed   By: Alcide Clever M.D.   On: 08/11/2023 02:53   CT ABDOMEN PELVIS W CONTRAST  Result Date: 08/11/2023 CLINICAL DATA:  Abdominal pain, acute, nonlocalized fever, abd pain EXAM: CT ABDOMEN AND PELVIS WITH CONTRAST TECHNIQUE: Multidetector CT imaging of the abdomen and pelvis was performed using the standard protocol following bolus administration of intravenous contrast. RADIATION DOSE REDUCTION: This exam was performed according to the departmental dose-optimization program which includes automated exposure control, adjustment of the mA and/or kV according to patient size and/or use of iterative reconstruction technique. CONTRAST:  OMNIPAQUE IOHEXOL 300 MG/ML  SOLN COMPARISON:  CT lumbar spine 02/23/2023, CT abdomen pelvis 10/14/2021 FINDINGS: Lower chest: No acute abnormality. Hepatobiliary: No focal liver abnormality. No gallstones, gallbladder wall thickening, or pericholecystic fluid. No biliary dilatation. Pancreas: Diffusely atrophic. No focal lesion. Otherwise normal pancreatic contour. No surrounding inflammatory changes. No main pancreatic ductal dilatation. Spleen: Splenule noted normal in size without focal abnormality. Adrenals/Urinary Tract: No adrenal nodule bilaterally. Bilateral kidneys enhance symmetrically. Mild right hydroureteronephrosis with abrupt change in caliber at the distal ureter (2:55). No right nephroureterolithiasis. No right hydroureteronephrosis. The urinary bladder is  unremarkable. On delayed imaging, there is no urothelial wall thickening and there are no filling defects in the opacified portions of the bilateral collecting systems or ureters. Stomach/Bowel: Stomach is within normal limits. No evidence of bowel wall thickening or dilatation. The appendix is not definitely identified with no inflammatory changes in the right lower quadrant to suggest acute appendicitis. Vascular/Lymphatic: No abdominal aorta or iliac aneurysm. Moderate to severe atherosclerotic plaque of the aorta and its branches. Prominent but nonenlarged mesenteric and retroperitoneal lymphadenopathy with vague small bowel mesenteric haziness. No abdominal, pelvic, or inguinal lymphadenopathy. Reproductive: Prostate is unremarkable. Other: No intraperitoneal free fluid. No intraperitoneal free gas. No organized fluid collection. Musculoskeletal: No abdominal wall hernia or abnormality. Diffusely decreased bone density. No suspicious lytic or blastic osseous lesions. No acute displaced fracture. L4-L5 and L5-S1 interbody surgical hardware. IMPRESSION: 1. Mild right hydroureteronephrosis with abrupt change in caliber at the distal ureter. No nephroureterolithiasis. This may reflect the residual of a recently passed calculus or reflect chronic changes of obstructive uropathy or reflux. 2. Diffusely decreased bone density. 3. Nonspecific small bowel misty mesentery. Electronically Signed   By: Tish Frederickson M.D.   On: 08/11/2023 01:40   DG Chest Port 1 View  Result Date: 08/10/2023 CLINICAL DATA:  Fever history of liver and kidney disease EXAM: PORTABLE CHEST 1 VIEW COMPARISON:  08/23/2022 FINDINGS: Borderline to mild cardiomegaly. No acute airspace disease or effusion. No pneumothorax. IMPRESSION: No active disease. Borderline to mild cardiomegaly. Electronically Signed   By: Jasmine Pang M.D.   On: 08/10/2023 23:27    My personal review of EKG: Rhythm NSR, Rate 60 bpm no acute ST changes   Assessment  & Plan:   1.  Abdominal pain due to choledocholithiasis.  Patient has had symptoms for 2 to 3 days with at least 1 to 2-day history of  fever -his LFTs are elevated, currently does not appear septic or toxic, WBC count stable, he has been seen by GI and will undergo ERCP tomorrow, for now bowel rest with clear liquids, n.p.o. after midnight, ERCP tomorrow.  I have also consulted Dr. Phylliss Blakes general surgery for eventual cholecystectomy.  He is agreeable for blood transfusions if needed.  Follow blood cultures, for now placed  on Zosyn as well.   2.  History of L-spine chronic infection.  On chronic Keflex for suppressive treatment for the last 1 year, post discharge follow-up with [facility] and neurosurgery, for now Zosyn.  History of MSSA infection in the past.  3.  Chronic diastolic CHF EF 60% on echocardiogram in 2022.  Clinically compensated.  As needed Lasix.  4.  Dyslipidemia.  Resume statin once LFTs have normalized.  5.  Hypertension.  For now as needed IV hydralazine.  6.  GERD.  PPI.  7.  Chronic back pain.  Again confirmed on very high doses of oral narcotics, resume with caution with as needed Narcan on board.  8.  CT scan suggestive of mild right-sided hydronephrosis, possible passed stone.  Urine without any blood in it, likely chronic finding, monitor renal function with hydration.  Outpatient urology follow-up.      DVT Prophylaxis Heparin   AM Labs Ordered, also please review Full Orders  Family Communication: Admission, patients condition and plan of care including tests being ordered have been discussed with the patient and wife who indicate understanding and agree with the plan and Code Status.  Code Status Full  Likely DC to  Home  Condition Fair  Consults called: GI, CCS    Admission status: Inpt    Time spent in minutes : 45  Signature  -    Susa Raring M.D on 08/11/2023 at 4:44 PM   -  To page go to [email or web address]

## 2023-08-11 NOTE — ED Notes (Signed)
EMTALA reviewed by this RN, consent signed, pt ready for transfer.

## 2023-08-11 NOTE — Progress Notes (Signed)
CODE SEPSIS - PHARMACY COMMUNICATION  **Broad Spectrum Antibiotics should be administered within 1 hour of Sepsis diagnosis**  Time Code Sepsis Called/Page Received:  8/15 @ 2248 = 2248  Antibiotics Ordered: Vanc, Cefepime   Time of 1st antibiotic administration: Cefepime 2 gm IV X 1 on 8/15 @ 2328   Additional action taken by pharmacy:   If necessary, Name of Provider/Nurse Contacted:     Eun Vermeer D ,PharmD Clinical Pharmacist  08/11/2023  12:21 AM

## 2023-08-11 NOTE — H&P (View-Only) (Signed)
Consultation  Referring Provider: Merwyn Katos, MD Primary Care Physician:  Corwin Levins, MD Primary Gastroenterologist:  unassigned  Reason for Consultation: Abdominal pain, fever, common bile duct stone on MRCP  HPI: Albert Wade is a 77 y.o. male who was transferred this afternoon from [facility] where he presented earlier today after an episode of acute abdominal pain located in the epigastrium and right upper quadrant associated with fever to 103.  On Thursday evening. He has been having intermittent episodes of less severe upper abdominal pain over the past few weeks. Workup at [facility] with abdominal ultrasound showing a well-distended gallbladder no wall thickening, small gallstones, common bile duct of 12 mm tapering to 7 mm MRI/MRCP reveals several tiny stones in the gallbladder, no gallbladder wall thickening or pericholecystic fluid, common bile duct 9 mm, down to 7 to 8 mm at the head of the pancreas, there is a potential 2 mm stone in the distal common bile duct just proximal to the ampulla  He also underwent MRI of the lumbar spine as he has had issues with spinal infection and has had numerous prior back surgeries. This showed a degenerated and postoperative lumbar spine with up to moderate foraminal narrowing, no clear sign of spinal infection  Initial labs-08/10/2023 with WBC of 8.3/hemoglobin 10.1/hematocrit 31.9 INR 1.1 T. bili 2.3/alk phos 203/AST 299/ALT 166 Lipase within normal limits Blood cultures are pending  Lactate today 1.3, remainder of today's labs pending  He has been started on Zosyn at home  Today he says he is feeling better but remains sore in his abdomen and is tender with any palpation of his abdomen though he is not having severe pain he has not spiked any further fevers.  He is hungry and has been n.p.o. since he presented to the emergency room at [facility].  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 previous fractures   BPH (benign prostatic hypertrophy)    BRONCHITIS, CHRONIC 04/29/2008   Qualifier: Diagnosis of  By: Craige Cotta MD, Vineet     CAD 10/16/2009   Qualifier: Diagnosis of  By: Oswald Hillock     CAD (coronary artery disease)    angioplasty    Chronic back pain    Chronic bronchitis    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/2016   Confusion 04/27/2017   COPD (chronic obstructive pulmonary disease) (HCC)    Depression    Encounter for preventative adult health care exam with abnormal findings 12/20/2013   Fever 04/27/2017   GERD (gastroesophageal reflux disease)    Gynecomastia 05/12/2015   H/O hiatal hernia    pts wife states pt does not have    Heart murmur    History of kidney stones    HTN (hypertension)    Hypercholesteremia    Hyperglycemia 05/19/2016   HYPERLIPIDEMIA 04/29/2008   Qualifier: Diagnosis of  By: Marinus Maw     Impaired glucose tolerance 12/20/2013   Insomnia 04/27/2017   Lumbosacral radiculopathy 10/14/2016   MI (myocardial infarction) [facility])    mid-'90's   MYOCARDIAL INFARCTION 04/29/2008   Qualifier: History of  By: Marinus Maw     OSA (obstructive sleep  apnea) 05/13/2011   Split night study 2011:  AHI 21/hr with obstructive and central events.  Placed on cpap and titrated to 13cm with reasonable control, but attempts to increase pressure increased the number of central events.  Medium resmed quattro full face mask used.     Peripheral neuropathy    peripheral neuropathy   Phimosis/adherent prepuce 10/21/2013   Pneumonia    Prostate cancer (HCC)    Shortness of breath 11/05/2010   Qualifier:  Diagnosis of  By: Annamary Rummage, RN, BSN, Jacquelyn     Sleep apnea    mild-no cpap, unable to tolerate   Squamous cell carcinoma of skin 10/29/2020   right mid volar forearm   Squamous cell carcinoma of skin 01/09/2023   Left forearm - [facility]   Umbilical hernia    Umbilical hernia without obstruction and without gangrene 05/19/2016   URI (upper respiratory infection) 04/27/2017   UTI (urinary tract infection) 12/01/2016   Varicose veins    Varicose veins of bilateral lower extremities with other complications 10/14/2016   Wheezing 01/01/2015    Past Surgical History:  Procedure Laterality Date   AMPUTATION TOE Right 04/01/2022   Procedure: AMPUTATION TOE-Right Great Toe Amputation;  Surgeon: Edwin Cap, DPM;  Location: [facility] ORS;  Service: Podiatry;  Laterality: Right;   ANGIOPLASTY     APPENDECTOMY     ARTERY BIOPSY Right 10/15/2021   Procedure: BIOPSY TEMPORAL ARTERY;  Surgeon: Sung Amabile, DO;  Location: [facility] ORS;  Service: General;  Laterality: Right;   BACK SURGERY     x3   CARDIAC CATHETERIZATION     11'11   CIRCUMCISION N/A 10/21/2013   Procedure: CIRCUMCISION ADULT;  Surgeon: Valetta Fuller, MD;  Location: [facility] ORS;  Service: Urology;  Laterality: N/A;   COLONOSCOPY WITH PROPOFOL N/A 06/09/2021   Procedure: COLONOSCOPY WITH PROPOFOL;  Surgeon: Wyline Mood, MD;  Location: [facility] ENDOSCOPY;  Service: Gastroenterology;  Laterality: N/A;   CYSTOSCOPY N/A 10/21/2013   Procedure: CYSTOSCOPY FLEXIBLE;  Surgeon: Valetta Fuller, MD;  Location: [facility] ORS;  Service: Urology;  Laterality: N/A;   CYSTOSCOPY W/ URETERAL STENT PLACEMENT Right 06/01/2020   Procedure: CYSTOSCOPY WITH RETROGRADE PYELOGRAM/URETERAL STENT Exchange;  Surgeon: Vanna Scotland, MD;  Location: [facility] ORS;  Service: Urology;  Laterality: Right;   CYSTOSCOPY WITH BIOPSY Right 01/06/2020   Procedure: CYSTOSCOPY WITH BIOPSY;  Surgeon: Vanna Scotland, MD;  Location: [facility] ORS;  Service: Urology;  Laterality: Right;    CYSTOSCOPY/URETEROSCOPY/HOLMIUM LASER/STENT PLACEMENT Right 01/06/2020   Procedure: CYSTOSCOPY/URETEROSCOPY/HOLMIUM LASER/STENT PLACEMENT;  Surgeon: Vanna Scotland, MD;  Location: [facility] ORS;  Service: Urology;  Laterality: Right;   ESOPHAGOGASTRODUODENOSCOPY N/A 02/03/2021   Procedure: ESOPHAGOGASTRODUODENOSCOPY (EGD);  Surgeon: Wyline Mood, MD;  Location: [facility] ENDOSCOPY;  Service: Gastroenterology;  Laterality: N/A;   ESOPHAGOGASTRODUODENOSCOPY (EGD) WITH PROPOFOL N/A 03/09/2021   Procedure: ESOPHAGOGASTRODUODENOSCOPY (EGD) WITH PROPOFOL;  Surgeon: Wyline Mood, MD;  Location: [facility] ENDOSCOPY;  Service: Gastroenterology;  Laterality: N/A;   HERNIA REPAIR     INSERTION OF MESH N/A 06/17/2016   Procedure: INSERTION OF MESH;  Surgeon: Glenna Fellows, MD;  Location: [facility];  Service: General;  Laterality: N/A;   UMBILICAL HERNIA REPAIR N/A 06/17/2016   Procedure: REPAIR UMBILICAL HERNIA WITH MESH;  Surgeon: Glenna Fellows, MD;  Location: [facility];  Service: General;  Laterality: N/A;   WRIST SURGERY Right     Prior to Admission medications   Medication Sig Start Date End Date Taking? Authorizing Provider  albuterol (VENTOLIN HFA) 108 (90 Base) MCG/ACT inhaler INHALE  2 PUFFS INTO THE LUNGS 4 TIMES DAILY AS NEEDED 07/05/23   Corwin Levins, MD  amLODipine (NORVASC) 2.5 MG tablet TAKE 1 TABLET BY MOUTH DAILY 05/26/23   Pricilla Riffle, MD  aspirin 81 MG tablet Take 81 mg by mouth daily.    [provider]  blood glucose meter kit and supplies KIT Dispense based on patient and insurance preference. Use up to four times daily as directed. 10/16/21   Pennie Banter, DO  cephALEXin (KEFLEX) 500 MG capsule Take 3,000 mg by mouth daily.    [provider]  citalopram (CELEXA) 20 MG tablet Take 1 tablet (20 mg total) by mouth daily. 07/25/23   Corwin Levins, MD  DULoxetine (CYMBALTA) 60 MG capsule Take 60 mg by mouth 2 (two) times daily.  04/23/20   [provider]  furosemide (LASIX) 40  MG tablet 1 tab by mouth once per day as needed 07/05/23   Corwin Levins, MD  omeprazole (PRILOSEC) 20 MG capsule TAKE ONE CAPSULE BY MOUTH TWICE A DAY 05/02/23   Wyline Mood, MD  oxycodone (ROXICODONE) 30 MG immediate release tablet 1 tab by mouth 5 times per day 07/05/23   Corwin Levins, MD  oxyCODONE-acetaminophen (PERCOCET) 10-325 MG tablet Take 2 tablets by mouth every 4 (four) hours as needed for pain.    [provider]  potassium chloride (KLOR-CON 10) 10 MEQ tablet 1 tab by mouth once daily when taking lasix 07/05/23   Corwin Levins, MD  simvastatin (ZOCOR) 40 MG tablet TAKE 1 TABLET BY MOUTH AT BEDTIME 07/13/23   Pricilla Riffle, MD  traZODone (DESYREL) 50 MG tablet Take 1 tablet (50 mg total) by mouth at bedtime as needed for sleep. 10/16/21   Pennie Banter, DO    Current Facility-Administered Medications  Medication Dose Route Frequency Provider Last Rate Last Admin   albuterol (PROVENTIL) (2.5 MG/3ML) 0.083% nebulizer solution 2.5 mg  2.5 mg Nebulization Q4H PRN Leroy Sea, MD       aspirin EC tablet 81 mg  81 mg Oral Daily Leroy Sea, MD   81 mg at 08/11/23 1622   citalopram (CELEXA) tablet 20 mg  20 mg Oral Daily Leroy Sea, MD       docusate sodium (COLACE) capsule 100 mg  100 mg Oral BID Leroy Sea, MD       heparin injection 5,000 Units  5,000 Units Subcutaneous Q8H Bow Buntyn S, PA-C       hydrALAZINE (APRESOLINE) injection 10 mg  10 mg Intravenous Q6H PRN Leroy Sea, MD       HYDROmorphone (DILAUDID) injection 0.5-1 mg  0.5-1 mg Intravenous Q2H PRN Leroy Sea, MD       lactated ringers infusion   Intravenous Continuous Leroy Sea, MD 100 mL/hr at 08/11/23 1623 New Bag at 08/11/23 1623   naloxone (NARCAN) injection 0.4 mg  0.4 mg Intravenous PRN Leroy Sea, MD       ondansetron [facility]) tablet 4 mg  4 mg Oral Q6H PRN Leroy Sea, MD       Or   ondansetron [facility]) injection 4 mg  4 mg Intravenous Q6H PRN  Leroy Sea, MD       oxyCODONE (Oxy IR/ROXICODONE) immediate release tablet 30 mg  30 mg Oral Q6H PRN Leroy Sea, MD       pantoprazole (PROTONIX) injection 40 mg  40 mg Intravenous Q12H Susa Raring  K, MD        Allergies as of 08/11/2023 - Reviewed 08/11/2023  Allergen Reaction Noted   Cyclobenzaprine  05/11/2020   Seroquel [quetiapine] Hives 06/01/2015    Family History  Problem Relation Age of Onset   Prostate cancer Father    Emphysema Father    Hyperlipidemia Father    Hyperlipidemia Mother     Social History   Socioeconomic History   Marital status: Married    Spouse name: Not on file   Number of children: Not on file   Years of education: Not on file   Highest education level: Not on file  Occupational History   Occupation: retired [employer] architect  Tobacco Use   Smoking status: Former    Current packs/day: 0.00    Average packs/day: 0.5 packs/day for 40.0 years (20.0 ttl pk-yrs)    Types: Cigarettes    Start date: 12/27/1955    Quit date: 12/27/1995    Years since quitting: 27.6   Smokeless tobacco: Never  Vaping Use   Vaping status: Never Used  Substance and Sexual Activity   Alcohol use: No   Drug use: No   Sexual activity: Yes  Other Topics Concern   Not on file  Social History Narrative   Not on file   Social Determinants of Health   Financial Resource Strain: High Risk (05/13/2023)   Received from [facility]   Overall Financial Resource Strain (CARDIA)    Difficulty of Paying Living Expenses: Hard  Food Insecurity: No Food Insecurity (08/11/2023)   Hunger Vital Sign    Worried About Running Out of Food in the Last Year: Never true    Ran Out of Food in the Last Year: Never true  Transportation Needs: No Transportation Needs (08/11/2023)   PRAPARE - Administrator, Civil Service (Medical): No    Lack of Transportation (Non-Medical): No  Physical Activity: Not on file   Stress: Not on file  Social Connections: Not on file  Intimate Partner Violence: Not At Risk (08/11/2023)   Humiliation, Afraid, Rape, and Kick questionnaire    Fear of Current or Ex-Partner: No    Emotionally Abused: No    Physically Abused: No    Sexually Abused: No    Review of Systems: Pertinent positive and negative review of systems were noted in the above HPI section.  All other review of systems was otherwise negative.   Physical Exam: Vital signs in last 24 hours: Temp:  [98.3 F (36.8 C)-99.5 F (37.5 C)] 98.3 F (36.8 C) (08/16 1543) Pulse Rate:  [49-74] 69 (08/16 1543) Resp:  [9-21] 16 (08/16 1543) BP: (85-168)/(42-79) 101/76 (08/16 1543) SpO2:  [94 %-100 %] 97 % (08/16 1543) Weight:  [82 kg-82.3 kg] 82 kg (08/16 1607)   General:   Alert,  Well-developed, well-nourished, elderly white male pleasant and cooperative in NAD-wife at bedside Head:  Normocephalic and atraumatic. Eyes:  Sclera early icterus conjunctiva pink. Ears:  Normal auditory acuity. Nose:  No deformity, discharge,  or lesions. Mouth:  No deformity or lesions.   Neck:  Supple; no masses or thyromegaly. Lungs:  Clear throughout to auscultation.   No wheezes, crackles, or rhonchi . Heart:  Regular rate and rhythm; no murmurs, clicks, rubs,  or gallops. Abdomen:  Soft, nondistended, bowel sounds are present, he is tender in the epigastrium and right upper quadrant, no rebound no palpable mass or hepatosplenomegaly Rectal not done Msk:  Symmetrical without  gross deformities. . Pulses:  Normal pulses noted. Extremities:  Without clubbing or edema. Neurologic:  Alert and  oriented x4;  grossly normal neurologically. Skin:  Intact without significant lesions or rashes.. Psych:  Alert and cooperative. Normal mood and affect.  Intake/Output from previous day: No intake/output data recorded. Intake/Output this shift: No intake/output data recorded.  Lab Results: Recent Labs    08/10/23 2248  WBC  8.3  HGB 10.1*  HCT 31.9*  PLT 133*   BMET Recent Labs    08/10/23 2248  NA 137  K 3.9  CL 104  CO2 24  GLUCOSE 125*  BUN 19  CREATININE 1.01  CALCIUM 7.8*   LFT Recent Labs    08/10/23 2248  PROT 5.8*  ALBUMIN 3.1*  AST 299*  ALT 166*  ALKPHOS 203*  BILITOT 2.3*   PT/INR Recent Labs    08/10/23 2248  LABPROT 14.6  INR 1.1   Hepatitis Panel No results for input(s): "HEPBSAG", "HCVAB", "HEPAIGM", "HEPBIGM" in the last 72 hours.   IMPRESSION:  #52 77 year old white male who has recently been having recurrent episodes of epigastric/right upper quadrant pain, who has also had some intermittent fevers recently but this is in the background of history of spinal infection and patient thought this may have been due to that. On 08/10/2023 he developed a more severe episode of the epigastric/right upper quadrant pain, and fever to 103 at home, and presented to the emergency room.  Workup thus far is consistent with cholelithiasis with no imaging evidence of cholecystitis, and on MRCP has a dilated CBD and a probable 3 mm common bile duct stone Interesting no leukocytosis, elevated LFTs yesterday with T. bili of 2.3 pending today  Concern for choledocholithiasis but with fever also concerning for cholangitis. Been on Zosyn, blood cultures pending  #2 chronic back pain with chronic narcotic use #3.  Coronary artery disease status post remote angioplasty, not currently anticoagulated #4 hypertension  #5 hyperlipidemia #6 history of prostate cancer  PLAN:  Clear liquids tonight, n.p.o. after midnight Continue Zosyn Patient has been scheduled for ERCP with stone extraction tomorrow a.m. with Dr. Chales Abrahams.  Procedure has been discussed in detail with the patient including indications risks and benefits and he is agreeable to proceed.  Hold heparin in a.m., do not resume until further orders from GI Patient also needs surgical consultation, and probably should have  laparoscopic cholecystectomy during this admission. Will follow-up labs in a.m.   Aisha Greenberger PA-C 08/11/2023, 4:58 PM

## 2023-08-11 NOTE — ED Provider Notes (Signed)
Patient received in signout from Dr. Arnoldo Morale pending remainder of blood work and CT abdomen/pelvis.  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  He has been fever free for the past 1+ month.  He presented to the ED due to acute fever and abdominal pain.  Transaminitis is noted  CT abdomen/pelvis largely without acute pathology  RUQ ultrasound obtained with evidence of gallstones and enlarged CBD, concerning for possible choledocholithiasis.  MRCP obtained with evidence of choledocholithiasis, despite motion degraded study.  Also obtained an MRI lumbar spine with and without contrast without evidence of abscess or infectious features.  Suspect his extensive ID and lumbar history is noncontributory to his fever today and is most consistent with cholangitis and choledocholithiasis.  Sepsis protocols are followed, cultures are drawn and he is started on antibiotics with appropriate intra-abdominal coverage.  His pain is controlled and he is aware of the need to transfer as our interventional GI physician is currently on vacation.  He is signed out to oncoming provider to finalize logistics of transfer to facilitate ERCP.  .Critical Care  Performed by: Delton Prairie, MD Authorized by: Delton Prairie, MD   Critical care provider statement:    Critical care time (minutes):  75   Critical care time was exclusive of:  Separately billable procedures and treating other patients   Critical care was necessary to treat or prevent imminent or life-threatening deterioration of the following conditions:  Sepsis   Critical care was time spent personally by me on the following activities:  Development of treatment plan with patient or surrogate, discussions with consultants, evaluation of patient's response to treatment, examination of patient, ordering and review of laboratory studies, ordering and review of  radiographic studies, ordering and performing treatments and interventions, pulse oximetry, re-evaluation of patient's condition and review of old charts        Delton Prairie, MD 08/11/23 [phone]

## 2023-08-11 NOTE — Consult Note (Signed)
GI Inpatient Consult Note  Reason for Consult:choledocholithiasis    Attending Requesting Consult: Dr. Katrinka Blazing  History of Present Illness: Albert Wade is a 77 y.o. male seen for evaluation of choledocholithiasis  at the request of Dr. Katrinka Blazing. Patient has a PMH of CAD, CHF, HTN, HLD, GERD, L2 -4 laminectomy with chronic infection.  Patient presented to the [facility] ED yesterday for chief complaint of rigor, fever of 103.8, right upper abdominal pain. Upon presentation to the ED, vital signs were 85/47 to 97/42 63, 16, SpO2 100%. Labs were significant for WBC 8.3, Hgb 10.1, Plts 133, gluc 125, K 3.9,  AST 299, ALT 166, ALK phos 203, TB 2.3. PT14.6, INR 1.1. Lactic acid 1.3. Blood cultures pending.  Imaging studies revealed ABD [place]: Prominent common bile duct. Distal common bile duct stone could not be totally excluded. Cholelithiasis without complicating factors. MRCP imaging is markedly motion degraded. Common bile duct in the head of the pancreas measures 7-8 mm diameter. Potential 2 mm stone in the distal common bile duct just proximal to the ampulla. 2. Several very tiny 1-2 mm stones in the dependent gallbladder lumen towards the neck. No gallbladder wall thickening or pericholecystic fluid. 3. Question mild peripancreatic edema in the head and body region. Lipase 32. GI is consulted for evaluation and management of choledocholithiasis.  Albert Wade reports that yesterday he had severe shaking spell and felt cold.  He checked his temperature it was 103.8.  He noticed mild right upper abdominal discomfort yesterday.  He has had no nausea or vomiting.  He ate chicken tenders and soda for supper last night.  Now, he reports 4-5 out of 10 right upper quadrant discomfort. It Is little tender when he presses on it.  He has no nausea or vomiting.  He remains NPO.  He has noted no further fevers or chills.  He has been in his usual state of health. 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  He has plans to have an MRI of the lumbar spine October 8 with ID follow-up October 28.  He is thinking they are going to have to replace his titanium cage. He has severe chronic constipation likely secondary to opioid use. He reports it is well managed with a brown pill. He has seen no melena or hematochezia.  He denies any alcohol use. No smoking.   The ED implemented sepsis protocol with blood cultures and  LR, and he has received IV Cefepime, Flagyl, Vancomycin.  Plans are being made to transfer him to a facility that can perform ERCP.    Last Colonoscopy: 06/09/2021: Indic: constipation: 5 mm polyp EGD on 02/03/2021 and extracted a bolus of food. There is a large amount of food residue in the gastric fundus. LA grade C esophagitis was noted.  EGD on 03/09/2021 after treating with PPI and noted a benign mild stenosis of the lower end of the esophagus which was dilated to 18 mm with a balloon.   Past Medical History:  Past Medical History:  Diagnosis Date   Acute bronchitis 01/01/2015   Acute upper respiratory infection 05/19/2016   Anginal pain (HCC)    Anxiety    Arthritis    back,wrists,hx. previous fractures   BPH (benign prostatic hypertrophy)    BRONCHITIS, CHRONIC 04/29/2008   Qualifier: Diagnosis of  By: Craige Cotta MD, Vineet     CAD 10/16/2009   Qualifier: Diagnosis  of  By: Oswald Hillock     CAD (coronary artery disease)    angioplasty    Chronic back pain    Chronic bronchitis    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/2016   Confusion 04/27/2017   COPD (chronic obstructive pulmonary disease) (HCC)    Depression    Encounter for preventative adult health care exam with abnormal findings 12/20/2013   Fever 04/27/2017   GERD (gastroesophageal reflux disease)    Gynecomastia 05/12/2015   H/O hiatal hernia    pts wife states pt does not have    Heart murmur    History of kidney stones    HTN (hypertension)     Hypercholesteremia    Hyperglycemia 05/19/2016   HYPERLIPIDEMIA 04/29/2008   Qualifier: Diagnosis of  By: Marinus Maw     Impaired glucose tolerance 12/20/2013   Insomnia 04/27/2017   Lumbosacral radiculopathy 10/14/2016   MI (myocardial infarction) [facility])    mid-'90's   MYOCARDIAL INFARCTION 04/29/2008   Qualifier: History of  By: Marinus Maw     OSA (obstructive sleep apnea) 05/13/2011   Split night study 2011:  AHI 21/hr with obstructive and central events.  Placed on cpap and titrated to 13cm with reasonable control, but attempts to increase pressure increased the number of central events.  Medium resmed quattro full face mask used.     Peripheral neuropathy    peripheral neuropathy   Phimosis/adherent prepuce 10/21/2013   Pneumonia    Prostate cancer (HCC)    Shortness of breath 11/05/2010   Qualifier: Diagnosis of  By: Annamary Rummage, RN, BSN, Jacquelyn     Sleep apnea    mild-no cpap, unable to tolerate   Squamous cell carcinoma of skin 10/29/2020   right mid volar forearm   Squamous cell carcinoma of skin 01/09/2023   Left forearm - [facility]   Umbilical hernia    Umbilical hernia without obstruction and without gangrene 05/19/2016   URI (upper respiratory infection) 04/27/2017   UTI (urinary tract infection) 12/01/2016   Varicose veins    Varicose veins of bilateral lower extremities with other complications 10/14/2016   Wheezing 01/01/2015    Problem List: Patient Active Problem List   Diagnosis Date Noted   Anxiety and depression 07/07/2023   B12 deficiency 07/05/2023   Chronic venous insufficiency 05/02/2023   Cellulitis of right foot 04/01/2022   Acute osteomyelitis of toe of right foot (HCC) 03/31/2022   Overweight (BMI 25.0-29.9) 03/31/2022   Abnormal LFTs 10/14/2021   Chronic diastolic CHF (congestive heart failure) (HCC) 10/14/2021   COPD (chronic obstructive pulmonary disease) (HCC)    CAD (coronary artery disease)    Acute metabolic encephalopathy     Hypokalemia    HTN (hypertension)    Elevated lactic acid level    Body aches 10/13/2021   GI obstruction (HCC) 02/15/2021   RUQ pain 11/09/2020   Ureteral stricture, right 06/01/2020   Weight loss 10/08/2019   Abdominal pain 10/08/2019   Therapeutic opioid induced constipation 10/08/2019   Fever 04/27/2017   URI (upper respiratory infection) 04/27/2017   Slowing, urinary stream 04/27/2017   Confusion 04/27/2017   Insomnia 04/27/2017   Prostate cancer (HCC) 01/27/2017   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/2016   Lumbosacral radiculopathy 10/14/2016   Varicose veins of bilateral lower extremities with other complications 10/14/2016   Umbilical hernia without obstruction and without gangrene 05/19/2016   Hyperglycemia 05/19/2016   Acute upper respiratory infection 05/19/2016   Gynecomastia 05/12/2015   Encounter  for preventative adult health care exam with abnormal findings 12/20/2013   GERD (gastroesophageal reflux disease)    Anxiety    Chronic back pain    Depression with anxiety    Benign prostatic hyperplasia    Peripheral neuropathy    Phimosis/adherent prepuce 10/21/2013   OSA (obstructive sleep apnea) 05/13/2011   Coronary atherosclerosis 10/16/2009   HLD (hyperlipidemia) 04/29/2008   MYOCARDIAL INFARCTION 04/29/2008   BRONCHITIS, CHRONIC 04/29/2008    Past Surgical History: Past Surgical History:  Procedure Laterality Date   AMPUTATION TOE Right 04/01/2022   Procedure: AMPUTATION TOE-Right Great Toe Amputation;  Surgeon: Edwin Cap, DPM;  Location: [facility] ORS;  Service: Podiatry;  Laterality: Right;   ANGIOPLASTY     APPENDECTOMY     ARTERY BIOPSY Right 10/15/2021   Procedure: BIOPSY TEMPORAL ARTERY;  Surgeon: Sung Amabile, DO;  Location: [facility] ORS;  Service: General;  Laterality: Right;   BACK SURGERY     x3   CARDIAC CATHETERIZATION     11'11   CIRCUMCISION N/A 10/21/2013   Procedure: CIRCUMCISION ADULT;  Surgeon: Valetta Fuller, MD;  Location: [facility]  ORS;  Service: Urology;  Laterality: N/A;   COLONOSCOPY WITH PROPOFOL N/A 06/09/2021   Procedure: COLONOSCOPY WITH PROPOFOL;  Surgeon: Wyline Mood, MD;  Location: [facility] ENDOSCOPY;  Service: Gastroenterology;  Laterality: N/A;   CYSTOSCOPY N/A 10/21/2013   Procedure: CYSTOSCOPY FLEXIBLE;  Surgeon: Valetta Fuller, MD;  Location: [facility] ORS;  Service: Urology;  Laterality: N/A;   CYSTOSCOPY W/ URETERAL STENT PLACEMENT Right 06/01/2020   Procedure: CYSTOSCOPY WITH RETROGRADE PYELOGRAM/URETERAL STENT Exchange;  Surgeon: Vanna Scotland, MD;  Location: [facility] ORS;  Service: Urology;  Laterality: Right;   CYSTOSCOPY WITH BIOPSY Right 01/06/2020   Procedure: CYSTOSCOPY WITH BIOPSY;  Surgeon: Vanna Scotland, MD;  Location: [facility] ORS;  Service: Urology;  Laterality: Right;   CYSTOSCOPY/URETEROSCOPY/HOLMIUM LASER/STENT PLACEMENT Right 01/06/2020   Procedure: CYSTOSCOPY/URETEROSCOPY/HOLMIUM LASER/STENT PLACEMENT;  Surgeon: Vanna Scotland, MD;  Location: [facility] ORS;  Service: Urology;  Laterality: Right;   ESOPHAGOGASTRODUODENOSCOPY N/A 02/03/2021   Procedure: ESOPHAGOGASTRODUODENOSCOPY (EGD);  Surgeon: Wyline Mood, MD;  Location: [facility] ENDOSCOPY;  Service: Gastroenterology;  Laterality: N/A;   ESOPHAGOGASTRODUODENOSCOPY (EGD) WITH PROPOFOL N/A 03/09/2021   Procedure: ESOPHAGOGASTRODUODENOSCOPY (EGD) WITH PROPOFOL;  Surgeon: Wyline Mood, MD;  Location: [facility] ENDOSCOPY;  Service: Gastroenterology;  Laterality: N/A;   HERNIA REPAIR     INSERTION OF MESH N/A 06/17/2016   Procedure: INSERTION OF MESH;  Surgeon: Glenna Fellows, MD;  Location: [facility] ORS;  Service: General;  Laterality: N/A;   UMBILICAL HERNIA REPAIR N/A 06/17/2016   Procedure: REPAIR UMBILICAL HERNIA WITH MESH;  Surgeon: Glenna Fellows, MD;  Location: [facility] ORS;  Service: General;  Laterality: N/A;   WRIST SURGERY Right     Allergies: Allergies  Allergen Reactions   Cyclobenzaprine     agitation    Seroquel [Quetiapine] Hives    Home Medications: (Not in a  hospital admission)  Home medication reconciliation was completed with the patient.   Continuous Inpatient Infusions:    lactated ringers 150 mL/hr at 08/10/23 2338   metronidazole     Family History: family history includes Emphysema in his father; Hyperlipidemia in his father and mother; Prostate cancer in his father.  The patient's family history is negative for inflammatory bowel disorders, GI malignancy, or solid organ transplantation.  Social History:   reports that he quit smoking about 27 years ago. His smoking use included cigarettes. He started smoking about 67 years ago. He has a 20  pack-year smoking history. He has never used smokeless tobacco. He reports that he does not drink alcohol and does not use drugs. The patient denies ETOH, tobacco, or drug use.   Review of Systems: Constitutional: Weight is stable.  Eyes: No changes in vision. ENT: No oral lesions, sore throat.  GI: see HPI.  Heme/Lymph: No easy bruising.  CV: No chest pain.  GU: No hematuria.  Integumentary: No rashes.  Neuro: No headaches.  Psych: No depression/anxiety.  Endocrine: No heat/cold intolerance.  Allergic/Immunologic: No urticaria.  Resp: No cough, SOB.  Musculoskeletal: chronic low back pain   Physical Examination: BP (!) 166/64   Pulse (!) 50   Temp 98.8 F (37.1 C) (Oral)   Resp 16   Ht 5' 10.5" (1.791 m)   Wt 82.3 kg   SpO2 96%   BMI 25.67 kg/m  Gen: NAD, alert and oriented x 4 HEENT: PEERLA, EOMI, Neck: supple, no JVD or thyromegaly Chest: CTA bilaterally, no wheezes, crackles, or other adventitious sounds CV: RRR, no m/g/c/r Abd: soft, Mild tender RUQ to palpation,  ND, +BS in all four quadrants; no HSM, guarding, rigidity, or rebound tenderness Ext: no edema, well perfused with 2+ pulses, Skin: no rash or lesions noted Lymph: no LAD  Data: Lab Results  Component Value Date   WBC 8.3 08/10/2023   HGB 10.1 (L) 08/10/2023   HCT 31.9 (L) 08/10/2023   MCV 86.4 08/10/2023    PLT 133 (L) 08/10/2023   Recent Labs  Lab 08/10/23 2248  HGB 10.1*   Lab Results  Component Value Date   NA 137 08/10/2023   K 3.9 08/10/2023   CL 104 08/10/2023   CO2 24 08/10/2023   BUN 19 08/10/2023   CREATININE 1.01 08/10/2023   Lab Results  Component Value Date   ALT 166 (H) 08/10/2023   AST 299 (H) 08/10/2023   ALKPHOS 203 (H) 08/10/2023   BILITOT 2.3 (H) 08/10/2023   Recent Labs  Lab 08/10/23 2248  APTT 25  INR 1.1   CLINICAL DATA:  Elevated LFTs   EXAM: ULTRASOUND ABDOMEN LIMITED RIGHT UPPER QUADRANT   COMPARISON:  CT from earlier in the same day.   FINDINGS: Gallbladder:   Gallbladder is well distended. No wall thickening or pericholecystic fluid is noted. Small dependent gallstones are seen. Negative sonographic Murphy's sign is elicited.   Common bile duct:   Diameter: 12 mm tapering to 7 mm. No intrahepatic ductal dilatation is seen.   Liver:   No focal lesion identified. Within normal limits in parenchymal echogenicity. Portal vein is patent on color Doppler imaging with normal direction of blood flow towards the liver.   Other: None.   IMPRESSION: Prominent common bile duct. Distal common bile duct stone could not be totally excluded.   Cholelithiasis without complicating factors.     Electronically Signed   By: Alcide Clever M.D.   On: 08/11/2023 02:5    Narrative & Impression  CLINICAL DATA:  Abdominal pain, acute, nonlocalized fever, abd pain   EXAM: CT ABDOMEN AND PELVIS WITH CONTRAST   TECHNIQUE: Multidetector CT imaging of the abdomen and pelvis was performed using the standard protocol following bolus administration of intravenous contrast.   RADIATION DOSE REDUCTION: This exam was performed according to the departmental dose-optimization program which includes automated exposure control, adjustment of the mA and/or kV according to patient size and/or use of iterative reconstruction technique.   CONTRAST:   OMNIPAQUE IOHEXOL 300 MG/ML  SOLN   COMPARISON:  CT lumbar spine 02/23/2023, CT abdomen pelvis 10/14/2021   FINDINGS: Lower chest: No acute abnormality.   Hepatobiliary: No focal liver abnormality. No gallstones, gallbladder wall thickening, or pericholecystic fluid. No biliary dilatation.   Pancreas: Diffusely atrophic. No focal lesion. Otherwise normal pancreatic contour. No surrounding inflammatory changes. No main pancreatic ductal dilatation.   Spleen: Splenule noted normal in size without focal abnormality.   Adrenals/Urinary Tract:   No adrenal nodule bilaterally.   Bilateral kidneys enhance symmetrically.   Mild right hydroureteronephrosis with abrupt change in caliber at the distal ureter (2:55). No right nephroureterolithiasis. No right hydroureteronephrosis.   The urinary bladder is unremarkable.   On delayed imaging, there is no urothelial wall thickening and there are no filling defects in the opacified portions of the bilateral collecting systems or ureters.   Stomach/Bowel: Stomach is within normal limits. No evidence of bowel wall thickening or dilatation. The appendix is not definitely identified with no inflammatory changes in the right lower quadrant to suggest acute appendicitis.   Vascular/Lymphatic: No abdominal aorta or iliac aneurysm. Moderate to severe atherosclerotic plaque of the aorta and its branches. Prominent but nonenlarged mesenteric and retroperitoneal lymphadenopathy with vague small bowel mesenteric haziness. No abdominal, pelvic, or inguinal lymphadenopathy.   Reproductive: Prostate is unremarkable.   Other: No intraperitoneal free fluid. No intraperitoneal free gas. No organized fluid collection.   Musculoskeletal:   No abdominal wall hernia or abnormality.   Diffusely decreased bone density. No suspicious lytic or blastic osseous lesions. No acute displaced fracture. L4-L5 and L5-S1 interbody surgical hardware.    IMPRESSION: 1. Mild right hydroureteronephrosis with abrupt change in caliber at the distal ureter. No nephroureterolithiasis. This may reflect the residual of a recently passed calculus or reflect chronic changes of obstructive uropathy or reflux. 2. Diffusely decreased bone density. 3. Nonspecific small bowel misty mesentery.     Electronically Signed   By: Tish Frederickson M.D.   On: 08/11/2023 01:40     Narrative & Impression  CLINICAL DATA:  Biliary dilatation with elevated LFTs.   EXAM: MRI ABDOMEN WITHOUT AND WITH CONTRAST (INCLUDING MRCP)   TECHNIQUE: Multiplanar multisequence MR imaging of the abdomen was performed both before and after the administration of intravenous contrast. Heavily T2-weighted images of the biliary and pancreatic ducts were obtained, and three-dimensional MRCP images were rendered by post processing.   CONTRAST:  8mL GADAVIST GADOBUTROL 1 MMOL/ML IV SOLN   COMPARISON:  CT scan from earlier same day   FINDINGS: Lower chest: Unremarkable.   Hepatobiliary: No suspicious focal abnormality within the liver parenchyma. No gallbladder wall thickening or pericholecystic fluid. Several very tiny 1-2 mm stones are seen in the dependent gallbladder lumen towards the neck (see axial T2 image 14 of series 9. MRCP imaging is markedly motion degraded. Common duct measures 9 mm diameter. Common bile duct in the head of the pancreas is 7-8 mm diameter. Potential 2 mm stone in the distal common bile duct just proximal to the ampulla (see axial T2 image 19 of series 9).   Pancreas: No main duct dilatation. No focal mass lesion. Axial T2 haste imaging raises the question of some mild peripancreatic edema in the head and body region.   Spleen:  No splenomegaly. No suspicious focal mass lesion.   Adrenals/Urinary Tract: No adrenal nodule or mass. No suspicious enhancing renal mass. Mild right hydroureteronephrosis.   Stomach/Bowel: Stomach is moderately  distended with food and fluid. Duodenum is normally positioned as is the ligament of Treitz. No small  bowel or colonic dilatation within the visualized abdomen.   Vascular/Lymphatic: No abdominal aortic aneurysm. 14 mm short axis hepatoduodenal ligament lymph node seen on 19/4, presumably reactive. 14 mm short axis celiac axis node seen on 16/4.   Other:  No intraperitoneal free fluid.   Musculoskeletal: No focal suspicious marrow enhancement within the visualized bony anatomy.   IMPRESSION: 1. MRCP imaging is markedly motion degraded. Common bile duct in the head of the pancreas measures 7-8 mm diameter. Potential 2 mm stone in the distal common bile duct just proximal to the ampulla. 2. Several very tiny 1-2 mm stones in the dependent gallbladder lumen towards the neck. No gallbladder wall thickening or pericholecystic fluid. 3. Question mild peripancreatic edema in the head and body region. Correlation with lipase recommended. 4. Mild right hydroureteronephrosis. Etiology for this finding is not evident on the current abdominal MRI. 5. Mildly enlarged hepatoduodenal ligament and celiac axis lymph nodes, presumably reactive. Consider follow-up CT in 3 months to ensure stability.    Electronically Signed   By: Kennith Center M.D.   On: 08/11/2023 06:29   Assessment/Plan: Albert Wade is a 77 y.o. male with a history of CAD, CHF, HTN, HLD, GERD, L2 -4 laminectomy with chronic infection is  seen for evaluation of choledocholithiasis  at the request of Dr. Katrinka Blazing.   Patient presented to the [facility] ED yesterday for chief complaint of rigor, fever of 103.8, right upper abdominal pain. Upon presentation to the ED, vital signs were 85/47 to 97/42 63, 16, SpO2 100%. Labs were significant for WBC 8.3, Hgb10.1, Plts 133,  GFR>60, gluc 125, K 3.9,  AST 299, ALT 166, ALK phos 203, TB 2.3. PT14.6, INR 1.1. Lactic acid 1.3. Blood cultures pending.    MRCP imaging is markedly motion degraded. Common  bile duct in the head of the pancreas measures 7-8 mm diameter. Potential 2 mm stone in the distal common bile duct just proximal to the ampulla. 2. Several very tiny 1-2 mm stones in the dependent gallbladder lumen towards the neck. No gallbladder wall thickening or pericholecystic fluid. 3. Question mild peripancreatic edema in the head and body region. Lipase 32.   Recommendations: Agree  with plans to transfer patient to facility that can perform ERCP. The patient is in full agreement.   Recommend IV Zosyn antibiotics. Blood cultures are pending. Remain NPO Monitor electrolytes and replace as needed Outpatient work up for chronic normocytic anemia without evidence for GI blood loss. Last EGD and colonoscopy performed in 2022.  Thank you for the consult. Please call with questions or concerns.  Amedeo Kinsman, ANP [facility] [phone]

## 2023-08-12 ENCOUNTER — Observation Stay (HOSPITAL_BASED_OUTPATIENT_CLINIC_OR_DEPARTMENT_OTHER): Payer: Medicare Other | Admitting: Anesthesiology

## 2023-08-12 ENCOUNTER — Encounter (HOSPITAL_COMMUNITY): Payer: Self-pay | Admitting: Internal Medicine

## 2023-08-12 ENCOUNTER — Observation Stay (HOSPITAL_COMMUNITY): Payer: Medicare Other

## 2023-08-12 ENCOUNTER — Encounter (HOSPITAL_COMMUNITY)
Disposition: A | Discharge status: Home / Self Care | Payer: Self-pay | Source: Other Acute Inpatient Hospital | Attending: Internal Medicine

## 2023-08-12 ENCOUNTER — Observation Stay (HOSPITAL_COMMUNITY): Payer: Medicare Other | Admitting: Anesthesiology

## 2023-08-12 DIAGNOSIS — Z981 Arthrodesis status: Secondary | ICD-10-CM | POA: Diagnosis not present

## 2023-08-12 DIAGNOSIS — G4700 Insomnia, unspecified: Secondary | ICD-10-CM | POA: Diagnosis present

## 2023-08-12 DIAGNOSIS — I110 Hypertensive heart disease with heart failure: Secondary | ICD-10-CM

## 2023-08-12 DIAGNOSIS — K8309 Other cholangitis: Secondary | ICD-10-CM | POA: Diagnosis present

## 2023-08-12 DIAGNOSIS — Z8042 Family history of malignant neoplasm of prostate: Secondary | ICD-10-CM | POA: Diagnosis not present

## 2023-08-12 DIAGNOSIS — Z83438 Family history of other disorder of lipoprotein metabolism and other lipidemia: Secondary | ICD-10-CM | POA: Diagnosis not present

## 2023-08-12 DIAGNOSIS — K8050 Calculus of bile duct without cholangitis or cholecystitis without obstruction: Secondary | ICD-10-CM

## 2023-08-12 DIAGNOSIS — G4733 Obstructive sleep apnea (adult) (pediatric): Secondary | ICD-10-CM | POA: Diagnosis present

## 2023-08-12 DIAGNOSIS — Z87891 Personal history of nicotine dependence: Secondary | ICD-10-CM

## 2023-08-12 DIAGNOSIS — K219 Gastro-esophageal reflux disease without esophagitis: Secondary | ICD-10-CM | POA: Diagnosis present

## 2023-08-12 DIAGNOSIS — Z8546 Personal history of malignant neoplasm of prostate: Secondary | ICD-10-CM | POA: Diagnosis not present

## 2023-08-12 DIAGNOSIS — K828 Other specified diseases of gallbladder: Secondary | ICD-10-CM | POA: Diagnosis present

## 2023-08-12 DIAGNOSIS — K8070 Calculus of gallbladder and bile duct without cholecystitis without obstruction: Secondary | ICD-10-CM | POA: Diagnosis not present

## 2023-08-12 DIAGNOSIS — R0602 Shortness of breath: Secondary | ICD-10-CM | POA: Diagnosis not present

## 2023-08-12 DIAGNOSIS — K811 Chronic cholecystitis: Secondary | ICD-10-CM | POA: Diagnosis not present

## 2023-08-12 DIAGNOSIS — I252 Old myocardial infarction: Secondary | ICD-10-CM | POA: Diagnosis not present

## 2023-08-12 DIAGNOSIS — E7800 Pure hypercholesterolemia, unspecified: Secondary | ICD-10-CM | POA: Diagnosis not present

## 2023-08-12 DIAGNOSIS — J449 Chronic obstructive pulmonary disease, unspecified: Secondary | ICD-10-CM | POA: Diagnosis not present

## 2023-08-12 DIAGNOSIS — K8064 Calculus of gallbladder and bile duct with chronic cholecystitis without obstruction: Secondary | ICD-10-CM | POA: Diagnosis not present

## 2023-08-12 DIAGNOSIS — F32A Depression, unspecified: Secondary | ICD-10-CM | POA: Diagnosis present

## 2023-08-12 DIAGNOSIS — I25119 Atherosclerotic heart disease of native coronary artery with unspecified angina pectoris: Secondary | ICD-10-CM | POA: Diagnosis not present

## 2023-08-12 DIAGNOSIS — G8929 Other chronic pain: Secondary | ICD-10-CM | POA: Diagnosis present

## 2023-08-12 DIAGNOSIS — Z85828 Personal history of other malignant neoplasm of skin: Secondary | ICD-10-CM | POA: Diagnosis not present

## 2023-08-12 DIAGNOSIS — N400 Enlarged prostate without lower urinary tract symptoms: Secondary | ICD-10-CM | POA: Diagnosis not present

## 2023-08-12 DIAGNOSIS — I2510 Atherosclerotic heart disease of native coronary artery without angina pectoris: Secondary | ICD-10-CM | POA: Diagnosis present

## 2023-08-12 DIAGNOSIS — I5032 Chronic diastolic (congestive) heart failure: Secondary | ICD-10-CM

## 2023-08-12 DIAGNOSIS — K819 Cholecystitis, unspecified: Secondary | ICD-10-CM | POA: Diagnosis not present

## 2023-08-12 DIAGNOSIS — Z79891 Long term (current) use of opiate analgesic: Secondary | ICD-10-CM | POA: Diagnosis not present

## 2023-08-12 DIAGNOSIS — K8020 Calculus of gallbladder without cholecystitis without obstruction: Secondary | ICD-10-CM | POA: Diagnosis not present

## 2023-08-12 DIAGNOSIS — F419 Anxiety disorder, unspecified: Secondary | ICD-10-CM | POA: Diagnosis present

## 2023-08-12 DIAGNOSIS — K851 Biliary acute pancreatitis without necrosis or infection: Secondary | ICD-10-CM | POA: Diagnosis not present

## 2023-08-12 DIAGNOSIS — Z825 Family history of asthma and other chronic lower respiratory diseases: Secondary | ICD-10-CM | POA: Diagnosis not present

## 2023-08-12 HISTORY — PX: REMOVAL OF STONES: SHX5545

## 2023-08-12 HISTORY — PX: SPHINCTEROTOMY: SHX5544

## 2023-08-12 HISTORY — PX: ERCP: SHX5425

## 2023-08-12 LAB — TYPE AND SCREEN
ABO/RH(D): A POS
Antibody Screen: NEGATIVE

## 2023-08-12 LAB — CBC WITH DIFFERENTIAL/PLATELET
Abs Immature Granulocytes: 0.02 10*3/uL (ref 0.00–0.07)
Basophils Absolute: 0 10*3/uL (ref 0.0–0.1)
Basophils Relative: 0 %
Eosinophils Absolute: 0 10*3/uL (ref 0.0–0.5)
Eosinophils Relative: 1 %
HCT: 31.2 % — ABNORMAL LOW (ref 39.0–52.0)
Hemoglobin: 10.2 g/dL — ABNORMAL LOW (ref 13.0–17.0)
Immature Granulocytes: 0 %
Lymphocytes Relative: 8 %
Lymphs Abs: 0.5 10*3/uL — ABNORMAL LOW (ref 0.7–4.0)
MCH: 27.2 pg (ref 26.0–34.0)
MCHC: 32.7 g/dL (ref 30.0–36.0)
MCV: 83.2 fL (ref 80.0–100.0)
Monocytes Absolute: 0.4 10*3/uL (ref 0.1–1.0)
Monocytes Relative: 7 %
Neutro Abs: 5.1 10*3/uL (ref 1.7–7.7)
Neutrophils Relative %: 84 %
Platelets: 123 10*3/uL — ABNORMAL LOW (ref 150–400)
RBC: 3.75 MIL/uL — ABNORMAL LOW (ref 4.22–5.81)
RDW: 16 % — ABNORMAL HIGH (ref 11.5–15.5)
WBC: 6.1 10*3/uL (ref 4.0–10.5)
nRBC: 0 % (ref 0.0–0.2)

## 2023-08-12 LAB — COMPREHENSIVE METABOLIC PANEL WITH GFR
ALT: 94 U/L — ABNORMAL HIGH (ref 0–44)
AST: 74 U/L — ABNORMAL HIGH (ref 15–41)
Albumin: 2.8 g/dL — ABNORMAL LOW (ref 3.5–5.0)
Alkaline Phosphatase: 179 U/L — ABNORMAL HIGH (ref 38–126)
Anion gap: 9 (ref 5–15)
BUN: 8 mg/dL (ref 8–23)
CO2: 24 mmol/L (ref 22–32)
Calcium: 8.5 mg/dL — ABNORMAL LOW (ref 8.9–10.3)
Chloride: 108 mmol/L (ref 98–111)
Creatinine, Ser: 0.97 mg/dL (ref 0.61–1.24)
GFR, Estimated: >60 mL/min
Glucose, Bld: 108 mg/dL — ABNORMAL HIGH (ref 70–99)
Potassium: 3.5 mmol/L (ref 3.5–5.1)
Sodium: 141 mmol/L (ref 135–145)
Total Bilirubin: 2.9 mg/dL — ABNORMAL HIGH (ref 0.3–1.2)
Total Protein: 5.5 g/dL — ABNORMAL LOW (ref 6.5–8.1)

## 2023-08-12 LAB — PROCALCITONIN: Procalcitonin: 3.37 ng/mL

## 2023-08-12 LAB — MAGNESIUM: Magnesium: 1.9 mg/dL (ref 1.7–2.4)

## 2023-08-12 LAB — BRAIN NATRIURETIC PEPTIDE: B Natriuretic Peptide: 596.3 pg/mL — ABNORMAL HIGH (ref 0.0–100.0)

## 2023-08-12 LAB — C-REACTIVE PROTEIN: CRP: 13.3 mg/dL — ABNORMAL HIGH

## 2023-08-12 SURGERY — ENDOSCOPIC RETROGRADE CHOLANGIOPANCREATOGRAPHY (ERCP)
Anesthesia: General

## 2023-08-12 MED ORDER — DICLOFENAC SUPPOSITORY 100 MG
RECTAL | Status: DC | PRN
  Administered 2023-08-12: 100 mg via RECTAL

## 2023-08-12 MED ORDER — IBUPROFEN 400 MG PO TABS
200.0000 mg | ORAL_TABLET | Freq: Once | ORAL | Status: AC
  Administered 2023-08-12: 200 mg via ORAL
  Filled 2023-08-12: qty 1

## 2023-08-12 MED ORDER — PHENYLEPHRINE 80 MCG/ML (10ML) SYRINGE FOR IV PUSH (FOR BLOOD PRESSURE SUPPORT)
PREFILLED_SYRINGE | INTRAVENOUS | Status: DC | PRN
  Administered 2023-08-12 (×2): 80 ug via INTRAVENOUS

## 2023-08-12 MED ORDER — PROPOFOL 10 MG/ML IV BOLUS
INTRAVENOUS | Status: DC | PRN
  Administered 2023-08-12: 120 mg via INTRAVENOUS

## 2023-08-12 MED ORDER — SODIUM CHLORIDE 0.9 % IV SOLN
INTRAVENOUS | Status: DC | PRN
  Administered 2023-08-12: 25 mL

## 2023-08-12 MED ORDER — FENTANYL CITRATE (PF) 250 MCG/5ML IJ SOLN
INTRAMUSCULAR | Status: AC
  Filled 2023-08-12: qty 5

## 2023-08-12 MED ORDER — DICLOFENAC SUPPOSITORY 100 MG
RECTAL | Status: AC
  Filled 2023-08-12: qty 1

## 2023-08-12 MED ORDER — LACTATED RINGERS IV SOLN: INTRAVENOUS | Status: DC

## 2023-08-12 MED ORDER — SUGAMMADEX SODIUM 200 MG/2ML IV SOLN
INTRAVENOUS | Status: DC | PRN
  Administered 2023-08-12: 200 mg via INTRAVENOUS

## 2023-08-12 MED ORDER — GLUCAGON HCL RDNA (DIAGNOSTIC) 1 MG IJ SOLR
INTRAMUSCULAR | Status: DC | PRN
  Administered 2023-08-12 (×2): .2 mg via INTRAVENOUS

## 2023-08-12 MED ORDER — ROCURONIUM BROMIDE 10 MG/ML (PF) SYRINGE
PREFILLED_SYRINGE | INTRAVENOUS | Status: DC | PRN
  Administered 2023-08-12: 60 mg via INTRAVENOUS

## 2023-08-12 MED ORDER — POTASSIUM CHLORIDE 10 MEQ/100ML IV SOLN
10.0000 meq | INTRAVENOUS | Status: AC
  Administered 2023-08-12 (×2): 10 meq via INTRAVENOUS
  Filled 2023-08-12 (×2): qty 100

## 2023-08-12 MED ORDER — DEXAMETHASONE SODIUM PHOSPHATE 10 MG/ML IJ SOLN
INTRAMUSCULAR | Status: DC | PRN
  Administered 2023-08-12: 4 mg via INTRAVENOUS

## 2023-08-12 MED ORDER — DICLOFENAC SUPPOSITORY 100 MG: 100.0000 mg | Freq: Once | RECTAL | Status: DC

## 2023-08-12 MED ORDER — ONDANSETRON HCL 4 MG/2ML IJ SOLN
INTRAMUSCULAR | Status: DC | PRN
  Administered 2023-08-12: 4 mg via INTRAVENOUS

## 2023-08-12 MED ORDER — SODIUM CHLORIDE 0.9 % IV BOLUS
500.0000 mL | Freq: Once | INTRAVENOUS | Status: AC
  Administered 2023-08-12: 500 mL via INTRAVENOUS

## 2023-08-12 MED ORDER — LIDOCAINE 2% (20 MG/ML) 5 ML SYRINGE
INTRAMUSCULAR | Status: DC | PRN
  Administered 2023-08-12: 80 mg via INTRAVENOUS

## 2023-08-12 MED ORDER — GLUCAGON HCL RDNA (DIAGNOSTIC) 1 MG IJ SOLR
INTRAMUSCULAR | Status: AC
  Filled 2023-08-12: qty 1

## 2023-08-12 MED ORDER — AMLODIPINE BESYLATE 10 MG PO TABS
10.0000 mg | ORAL_TABLET | Freq: Every day | ORAL | Status: DC
  Administered 2023-08-12 – 2023-08-14 (×3): 10 mg via ORAL
  Filled 2023-08-12 (×4): qty 1

## 2023-08-12 MED ORDER — FENTANYL CITRATE (PF) 250 MCG/5ML IJ SOLN
INTRAMUSCULAR | Status: DC | PRN
  Administered 2023-08-12: 100 ug via INTRAVENOUS

## 2023-08-12 NOTE — Progress Notes (Signed)
PROGRESS NOTE                                                                                                                                                                                                             Patient Demographics:    Jammes Rizer, is a 77 y.o. male, DOB - 11-05-1946, OVF:[id number]  Outpatient Primary MD for the patient is Corwin Levins, MD    LOS - 1  Admit date - 08/11/2023    Chief complaint.  Abdominal pain, ongoing chronic back pain      Brief Narrative (HPI from H&P)   77 y.o. 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  He was seen by GI at [facility] and then transferred to [facility] for further management of choledocholithiasis, he was accepted by [facility] here.    Subjective:    Trenten Twohig today has, No headache, No chest pain, mild right upper quadrant abdominal pain, no nausea, complains of more chronic back pain, no focal weakness   Assessment  & Plan :    1.  Abdominal pain due to choledocholithiasis.  Patient has had symptoms for 2 to 3 days with at least 1 to 2-day history of  fever - his LFTs are elevated, currently does not appear septic or toxic, WBC count stable, he has been seen by GI and will undergo ERCP on 08/12/2023, continue IV fluids, Zosyn, general surgery also on board for eventual cholecystectomy, monitor cultures.    2.  History of L-spine chronic infection.  On chronic Keflex for suppressive  treatment for the last 1 year, post discharge follow-up with [facility] and neurosurgery, for now Zosyn.  History of MSSA infection in the past.   3.  Chronic diastolic CHF EF 60% on echocardiogram in 2022.  Clinically compensated.  As needed Lasix.   4.  Dyslipidemia.  Resume statin once  LFTs have normalized.   5.  Hypertension.  Norvasc along with as needed IV hydralazine.   6.  GERD.  PPI.   7.  Chronic back pain.  Again confirmed on very high doses of oral narcotics, resume with caution with as needed Narcan on board.   8.  CT scan suggestive of mild right-sided hydronephrosis, possible passed stone per CT report.  Urine without any blood in it, likely chronic finding, monitor renal function with hydration.  Outpatient urology follow-up.   9.  Incidental finding of prominent hepatoduodenal ligament on MRCP.  Defer to GI.  PCP to monitor.        Condition - Fair  Family Communication  :  wife bedside on 08/11/2023  Code Status : Full code  Consults  : [facility], general surgery  PUD Prophylaxis : PPI   Procedures  :     MRCP -  1. MRCP imaging is markedly motion degraded. Common bile duct in the head of the pancreas measures 7-8 mm diameter. Potential 2 mm stone in the distal common bile duct just proximal to the ampulla. 2. Several very tiny 1-2 mm stones in the dependent gallbladder lumen towards the neck. No gallbladder wall thickening or pericholecystic fluid. 3. Question mild peripancreatic edema in the head and body region. Correlation with lipase recommended. 4. Mild right hydroureteronephrosis. Etiology for this finding is not evident on the current abdominal MRI. 5. Mildly enlarged hepatoduodenal ligament and celiac axis lymph nodes, presumably reactive. Consider follow-up CT in 3 months to ensure stability.   ERCP        Disposition Plan  :    Status is: Observation  DVT Prophylaxis  :    heparin injection 5,000 Units Start: 08/12/23 1400    Lab Results   Component Value Date   PLT 123 (L) 08/12/2023    Diet :  Diet Order             Diet NPO time specified Except for: Sips with Meds  Diet effective midnight                    Inpatient Medications  Scheduled Meds:  amLODipine  10 mg Oral Daily   aspirin EC  81 mg Oral Daily   citalopram  20 mg Oral Daily   docusate sodium  100 mg Oral BID   heparin  5,000 Units Subcutaneous Q8H   pantoprazole (PROTONIX) IV  40 mg Intravenous Q12H   Continuous Infusions:  lactated ringers 100 mL/hr at 08/12/23 0632   piperacillin-tazobactam (ZOSYN)  IV 12.5 mL/hr at 08/12/23 0632   PRN Meds:.albuterol, hydrALAZINE, HYDROmorphone (DILAUDID) injection, naLOXone (NARCAN)  injection, ondansetron **OR** ondansetron (ZOFRAN) IV, oxycodone, traZODone  Antibiotics  :    Anti-infectives (From admission, onward)    Start     Dose/Rate Route Frequency Ordered Stop   08/11/23 1900  piperacillin-tazobactam (ZOSYN) IVPB 3.375 g        3.375 g 12.5 mL/hr over 240 Minutes Intravenous Every 8 hours 08/11/23 1716           Objective:   Vitals:   08/12/23 0234 08/12/23 0333 08/12/23 0400 08/12/23 0743  BP: 139/73  (!) 161/66 (!) 168/70  Pulse: 77  64 (!) 54  Resp: 18  18 17   Temp: (!) 102.3 F (39.1 C) 99.8 F (37.7 C) 99.6 F (37.6 C) 97.7 F (36.5 C)  TempSrc:  Oral Oral Oral  SpO2: 96%  98% 98%  Weight:  Height:        Wt Readings from Last 3 Encounters:  08/11/23 82 kg  08/10/23 82.3 kg  07/05/23 76.2 kg     Intake/Output Summary (Last 24 hours) at 08/12/2023 0831 Last data filed at 08/12/2023 5956 Gross per 24 hour  Intake 1834.47 ml  Output 3180 ml  Net -1345.53 ml     Physical Exam  Awake Alert, No new F.N deficits, Normal affect [place].AT,PERRAL Supple Neck, No JVD,   Symmetrical Chest wall movement, Good air movement bilaterally, CTAB RRR,No Gallops,Rubs or new Murmurs,  +ve B.Sounds, Abd Soft, No tenderness,   No Cyanosis, Clubbing or edema        Data  Review:    Recent Labs  Lab 08/10/23 2248 08/11/23 1640 08/12/23 0644  WBC 8.3 8.9 6.1  HGB 10.1* 10.6* 10.2*  HCT 31.9* 33.0* 31.2*  PLT 133* 135* 123*  MCV 86.4 84.0 83.2  MCH 27.4 27.0 27.2  MCHC 31.7 32.1 32.7  RDW 15.8* 16.1* 16.0*  LYMPHSABS 0.3*  --  0.5*  MONOABS 0.5  --  0.4  EOSABS 0.0  --  0.0  BASOSABS 0.0  --  0.0    Recent Labs  Lab 08/10/23 2248 08/11/23 0048 08/11/23 1640 08/12/23 0644  NA 137  --   --  141  K 3.9  --   --  3.5  CL 104  --   --  108  CO2 24  --   --  24  ANIONGAP 9  --   --  9  GLUCOSE 125*  --   --  108*  BUN 19  --   --  8  CREATININE 1.01  --  0.83 0.97  AST 299*  --   --  74*  ALT 166*  --   --  94*  ALKPHOS 203*  --   --  179*  BILITOT 2.3*  --   --  2.9*  ALBUMIN 3.1*  --   --  2.8*  LATICACIDVEN  --  1.3  --   --   INR 1.1  --  1.2  --   BNP  --   --   --  596.3*  MG  --   --   --  1.9  CALCIUM 7.8*  --   --  8.5*      Recent Labs  Lab 08/10/23 2248 08/11/23 0048 08/11/23 1640 08/12/23 0644  LATICACIDVEN  --  1.3  --   --   INR 1.1  --  1.2  --   BNP  --   --   --  596.3*  MG  --   --   --  1.9  CALCIUM 7.8*  --   --  8.5*      Micro Results Recent Results (from the past 240 hour(s))  SARS Coronavirus 2 by RT PCR (hospital order, performed in [facility] hospital lab) *cepheid single result test* Anterior Nasal Swab     Status: None   Collection Time: 08/10/23 10:49 PM   Specimen: Anterior Nasal Swab  Result Value Ref Range Status   SARS Coronavirus 2 by RT PCR NEGATIVE NEGATIVE Final    Comment: (NOTE) SARS-CoV-2 target nucleic acids are NOT DETECTED.  The SARS-CoV-2 RNA is generally detectable in upper and lower respiratory specimens during the acute phase of infection. The lowest concentration of SARS-CoV-2 viral copies this assay can detect is 250 copies / mL. A negative result does not preclude SARS-CoV-2 infection  and should not be used as the sole basis for treatment or other patient  management decisions.  A negative result may occur with improper specimen collection / handling, submission of specimen other than nasopharyngeal swab, presence of viral mutation(s) within the areas targeted by this assay, and inadequate number of viral copies (<250 copies / mL). A negative result must be combined with clinical observations, patient history, and epidemiological information.  Fact Sheet for Patients:   RoadLapTop.co.za  Fact Sheet for Healthcare Providers: http://kim-miller.com/  This test is not yet approved or  cleared by the [place] FDA and has been authorized for detection and/or diagnosis of SARS-CoV-2 by FDA under an Emergency Use Authorization (EUA).  This EUA will remain in effect (meaning this test can be used) for the duration of the COVID-19 declaration under Section 564(b)(1) of the Act, 21 U.S.C. section 360bbb-3(b)(1), unless the authorization is terminated or revoked sooner.  Performed at [facility], [address] [place], [place] [address]   Blood Culture (routine x 2)     Status: None (Preliminary result)   Collection Time: 08/10/23 11:36 PM   Specimen: BLOOD  Result Value Ref Range Status   Specimen Description BLOOD RIGHT ARM  Final   Special Requests   Final    BOTTLES DRAWN AEROBIC AND ANAEROBIC Blood Culture adequate volume   Culture   Final    NO GROWTH 2 DAYS Performed at [facility] [address], [place], [place] [address]    Report Status PENDING  Incomplete  Blood Culture (routine x 2)     Status: None (Preliminary result)   Collection Time: 08/10/23 11:39 PM   Specimen: BLOOD  Result Value Ref Range Status   Specimen Description BLOOD LEFT ARM  Final   Special Requests   Final    BOTTLES DRAWN AEROBIC AND ANAEROBIC Blood Culture adequate volume   Culture   Final    NO GROWTH 2 DAYS Performed at [facility], [address], [place],  [place] [address]    Report Status PENDING  Incomplete  MRSA Next Gen by PCR, Nasal     Status: None   Collection Time: 08/11/23  3:56 PM   Specimen: Nasal Mucosa; Nasal Swab  Result Value Ref Range Status   MRSA by PCR Next Gen NOT DETECTED NOT DETECTED Final    Comment: (NOTE) The GeneXpert MRSA Assay (FDA approved for NASAL specimens only), is one component of a comprehensive MRSA colonization surveillance program. It is not intended to diagnose MRSA infection nor to guide or monitor treatment for MRSA infections. Test performance is not FDA approved in patients less than 49 years old. Performed at [facility], [address], [place], [place] [address]     Radiology Reports DG Chest [place] 1 View  Result Date: 08/12/2023 CLINICAL DATA:  Shortness of breath. EXAM: PORTABLE CHEST 1 VIEW COMPARISON:  08/10/2023 FINDINGS: The lungs are clear without focal pneumonia, edema, pneumothorax or pleural effusion. The cardiopericardial silhouette is within normal limits for size. No acute bony abnormality. Telemetry leads overlie the chest. IMPRESSION: No active disease. Electronically Signed   By: Kennith Center M.D.   On: 08/12/2023 07:17   MR ABDOMEN MRCP W WO CONTAST  Result Date: 08/11/2023 CLINICAL DATA:  Biliary dilatation with elevated LFTs. EXAM: MRI ABDOMEN WITHOUT AND WITH CONTRAST (INCLUDING MRCP) TECHNIQUE: Multiplanar multisequence MR imaging of the abdomen was performed both before and after the administration of intravenous contrast. Heavily T2-weighted images of  the biliary and pancreatic ducts were obtained, and three-dimensional MRCP images were rendered by post processing. CONTRAST:  8mL GADAVIST GADOBUTROL 1 MMOL/ML IV SOLN COMPARISON:  CT scan from earlier same day FINDINGS: Lower chest: Unremarkable. Hepatobiliary: No suspicious focal abnormality within the liver parenchyma. No gallbladder wall thickening or pericholecystic fluid. Several very tiny 1-2 mm stones are seen in the  dependent gallbladder lumen towards the neck (see axial T2 image 14 of series 9. MRCP imaging is markedly motion degraded. Common duct measures 9 mm diameter. Common bile duct in the head of the pancreas is 7-8 mm diameter. Potential 2 mm stone in the distal common bile duct just proximal to the ampulla (see axial T2 image 19 of series 9). Pancreas: No main duct dilatation. No focal mass lesion. Axial T2 haste imaging raises the question of some mild peripancreatic edema in the head and body region. Spleen:  No splenomegaly. No suspicious focal mass lesion. Adrenals/Urinary Tract: No adrenal nodule or mass. No suspicious enhancing renal mass. Mild right hydroureteronephrosis. Stomach/Bowel: Stomach is moderately distended with food and fluid. Duodenum is normally positioned as is the ligament of Treitz. No small bowel or colonic dilatation within the visualized abdomen. Vascular/Lymphatic: No abdominal aortic aneurysm. 14 mm short axis hepatoduodenal ligament lymph node seen on 19/4, presumably reactive. 14 mm short axis celiac axis node seen on 16/4. Other:  No intraperitoneal free fluid. Musculoskeletal: No focal suspicious marrow enhancement within the visualized bony anatomy. IMPRESSION: 1. MRCP imaging is markedly motion degraded. Common bile duct in the head of the pancreas measures 7-8 mm diameter. Potential 2 mm stone in the distal common bile duct just proximal to the ampulla. 2. Several very tiny 1-2 mm stones in the dependent gallbladder lumen towards the neck. No gallbladder wall thickening or pericholecystic fluid. 3. Question mild peripancreatic edema in the head and body region. Correlation with lipase recommended. 4. Mild right hydroureteronephrosis. Etiology for this finding is not evident on the current abdominal MRI. 5. Mildly enlarged hepatoduodenal ligament and celiac axis lymph nodes, presumably reactive. Consider follow-up CT in 3 months to ensure stability. Electronically Signed   By: Kennith Center M.D.   On: 08/11/2023 06:29   MR 3D Recon At Scanner  Result Date: 08/11/2023 CLINICAL DATA:  Biliary dilatation with elevated LFTs. EXAM: MRI ABDOMEN WITHOUT AND WITH CONTRAST (INCLUDING MRCP) TECHNIQUE: Multiplanar multisequence MR imaging of the abdomen was performed both before and after the administration of intravenous contrast. Heavily T2-weighted images of the biliary and pancreatic ducts were obtained, and three-dimensional MRCP images were rendered by post processing. CONTRAST:  8mL GADAVIST GADOBUTROL 1 MMOL/ML IV SOLN COMPARISON:  CT scan from earlier same day FINDINGS: Lower chest: Unremarkable. Hepatobiliary: No suspicious focal abnormality within the liver parenchyma. No gallbladder wall thickening or pericholecystic fluid. Several very tiny 1-2 mm stones are seen in the dependent gallbladder lumen towards the neck (see axial T2 image 14 of series 9. MRCP imaging is markedly motion degraded. Common duct measures 9 mm diameter. Common bile duct in the head of the pancreas is 7-8 mm diameter. Potential 2 mm stone in the distal common bile duct just proximal to the ampulla (see axial T2 image 19 of series 9). Pancreas: No main duct dilatation. No focal mass lesion. Axial T2 haste imaging raises the question of some mild peripancreatic edema in the head and body region. Spleen:  No splenomegaly. No suspicious focal mass lesion. Adrenals/Urinary Tract: No adrenal nodule or mass. No suspicious enhancing renal mass.  Mild right hydroureteronephrosis. Stomach/Bowel: Stomach is moderately distended with food and fluid. Duodenum is normally positioned as is the ligament of Treitz. No small bowel or colonic dilatation within the visualized abdomen. Vascular/Lymphatic: No abdominal aortic aneurysm. 14 mm short axis hepatoduodenal ligament lymph node seen on 19/4, presumably reactive. 14 mm short axis celiac axis node seen on 16/4. Other:  No intraperitoneal free fluid. Musculoskeletal: No focal  suspicious marrow enhancement within the visualized bony anatomy. IMPRESSION: 1. MRCP imaging is markedly motion degraded. Common bile duct in the head of the pancreas measures 7-8 mm diameter. Potential 2 mm stone in the distal common bile duct just proximal to the ampulla. 2. Several very tiny 1-2 mm stones in the dependent gallbladder lumen towards the neck. No gallbladder wall thickening or pericholecystic fluid. 3. Question mild peripancreatic edema in the head and body region. Correlation with lipase recommended. 4. Mild right hydroureteronephrosis. Etiology for this finding is not evident on the current abdominal MRI. 5. Mildly enlarged hepatoduodenal ligament and celiac axis lymph nodes, presumably reactive. Consider follow-up CT in 3 months to ensure stability. Electronically Signed   By: Kennith Center M.D.   On: 08/11/2023 06:29   MR Lumbar Spine W Wo Contrast  Result Date: 08/11/2023 CLINICAL DATA:  MSSA seeding of hardware. EXAM: MRI LUMBAR SPINE WITHOUT AND WITH CONTRAST TECHNIQUE: Multiplanar and multiecho pulse sequences of the lumbar spine were obtained without and with intravenous contrast. CONTRAST:  8mL GADAVIST GADOBUTROL 1 MMOL/ML IV SOLN COMPARISON:  05/05/2023 FINDINGS: Segmentation:  Standard. Alignment:  Minimal dextrocurvature Vertebrae: L4-5 and L5-S1 fusion with solid intervertebral arthrodesis. Minor marrow edema in the endplates at L3-4, likely degenerative in the absence of paravertebral edema or disc T2 hyperintensity and in the presence of vacuum phenomenon by prior CT. Postcontrast assessment is limited by metal artifact. Conus medullaris and cauda equina: Conus extends to the L2 level. Conus and cauda equina appear normal. Paraspinal and other soft tissues: Full urinary bladder and right hydronephrosis. The right ureter appears reimplanted and this may be from stricture or reflux. There is contemporaneous abdominal CT. Disc levels: T12- L1: Ventral spondylitic spurring. L1-L2:  Disc narrowing and bulging with biforaminal protrusion. Bilateral facet spurring. Moderate left and mild-to-moderate right foraminal narrowing. L2-L3: Circumferential disc bulging. Degenerative facet spurring on both sides. Mild bilateral foraminal narrowing. L3-L4: Disc degeneration with endplate ridging. Facet spurring asymmetric to the left. Moderate left more than right foraminal narrowing. Widely patent spinal canal after laminectomy. L4-L5: Fusion without impingement L5-S1:Fusion without impingement IMPRESSION: 1. No acute finding.  No clear sign of spinal infection. 2. Degenerated and postoperative lumbar spine with up to moderate foraminal narrowing at L1-2 and L3-4. Diffusely patent spinal canal. Electronically Signed   By: Tiburcio Pea M.D.   On: 08/11/2023 04:39   US ABDOMEN LIMITED RUQ (LIVER/GB)  Result Date: 08/11/2023 CLINICAL DATA:  Elevated LFTs EXAM: ULTRASOUND ABDOMEN LIMITED RIGHT UPPER QUADRANT COMPARISON:  CT from earlier in the same day. FINDINGS: Gallbladder: Gallbladder is well distended. No wall thickening or pericholecystic fluid is noted. Small dependent gallstones are seen. Negative sonographic Murphy's sign is elicited. Common bile duct: Diameter: 12 mm tapering to 7 mm. No intrahepatic ductal dilatation is seen. Liver: No focal lesion identified. Within normal limits in parenchymal echogenicity. Portal vein is patent on color Doppler imaging with normal direction of blood flow towards the liver. Other: None. IMPRESSION: Prominent common bile duct. Distal common bile duct stone could not be totally excluded. Cholelithiasis without complicating factors. Electronically Signed  By: Alcide Clever M.D.   On: 08/11/2023 02:53   CT ABDOMEN PELVIS W CONTRAST  Result Date: 08/11/2023 CLINICAL DATA:  Abdominal pain, acute, nonlocalized fever, abd pain EXAM: CT ABDOMEN AND PELVIS WITH CONTRAST TECHNIQUE: Multidetector CT imaging of the abdomen and pelvis was performed using the standard  protocol following bolus administration of intravenous contrast. RADIATION DOSE REDUCTION: This exam was performed according to the departmental dose-optimization program which includes automated exposure control, adjustment of the mA and/or kV according to patient size and/or use of iterative reconstruction technique. CONTRAST:  OMNIPAQUE IOHEXOL 300 MG/ML  SOLN COMPARISON:  CT lumbar spine 02/23/2023, CT abdomen pelvis 10/14/2021 FINDINGS: Lower chest: No acute abnormality. Hepatobiliary: No focal liver abnormality. No gallstones, gallbladder wall thickening, or pericholecystic fluid. No biliary dilatation. Pancreas: Diffusely atrophic. No focal lesion. Otherwise normal pancreatic contour. No surrounding inflammatory changes. No main pancreatic ductal dilatation. Spleen: Splenule noted normal in size without focal abnormality. Adrenals/Urinary Tract: No adrenal nodule bilaterally. Bilateral kidneys enhance symmetrically. Mild right hydroureteronephrosis with abrupt change in caliber at the distal ureter (2:55). No right nephroureterolithiasis. No right hydroureteronephrosis. The urinary bladder is unremarkable. On delayed imaging, there is no urothelial wall thickening and there are no filling defects in the opacified portions of the bilateral collecting systems or ureters. Stomach/Bowel: Stomach is within normal limits. No evidence of bowel wall thickening or dilatation. The appendix is not definitely identified with no inflammatory changes in the right lower quadrant to suggest acute appendicitis. Vascular/Lymphatic: No abdominal aorta or iliac aneurysm. Moderate to severe atherosclerotic plaque of the aorta and its branches. Prominent but nonenlarged mesenteric and retroperitoneal lymphadenopathy with vague small bowel mesenteric haziness. No abdominal, pelvic, or inguinal lymphadenopathy. Reproductive: Prostate is unremarkable. Other: No intraperitoneal free fluid. No intraperitoneal free gas. No  organized fluid collection. Musculoskeletal: No abdominal wall hernia or abnormality. Diffusely decreased bone density. No suspicious lytic or blastic osseous lesions. No acute displaced fracture. L4-L5 and L5-S1 interbody surgical hardware. IMPRESSION: 1. Mild right hydroureteronephrosis with abrupt change in caliber at the distal ureter. No nephroureterolithiasis. This may reflect the residual of a recently passed calculus or reflect chronic changes of obstructive uropathy or reflux. 2. Diffusely decreased bone density. 3. Nonspecific small bowel misty mesentery. Electronically Signed   By: Tish Frederickson M.D.   On: 08/11/2023 01:40   DG Chest Port 1 View  Result Date: 08/10/2023 CLINICAL DATA:  Fever history of liver and kidney disease EXAM: PORTABLE CHEST 1 VIEW COMPARISON:  08/23/2022 FINDINGS: Borderline to mild cardiomegaly. No acute airspace disease or effusion. No pneumothorax. IMPRESSION: No active disease. Borderline to mild cardiomegaly. Electronically Signed   By: Jasmine Pang M.D.   On: 08/10/2023 23:27      Signature  -   Susa Raring M.D on 08/12/2023 at 8:31 AM   -  To page go to [email or web address]

## 2023-08-12 NOTE — Plan of Care (Signed)
  Problem: Activity: Goal: Risk for activity intolerance will decrease Outcome: Not Progressing   Problem: Pain Managment: Goal: General experience of comfort will improve Outcome: Not Progressing

## 2023-08-12 NOTE — Progress Notes (Signed)
PT Cancellation Note  Patient Details Name: Albert Wade [name]: [id number] DOB: December 09, 1946   Cancelled Treatment:    Reason Eval/Treat Not Completed: Other (comment) Pt reports he has been ambulating independently in room after procedure, no issues with balance, strength, or any need for DME. Pt also reports planned surgical intervention for gallbladder removal tomorrow, will check in again after that procedure to ensure no acute PT needs.   Vickki Muff, PT, DPT   Acute Rehabilitation Department Office [phone] Secure Chat Communication Preferred  Ronnie Derby 08/12/2023, 4:19 PM

## 2023-08-12 NOTE — Anesthesia Procedure Notes (Signed)
Procedure Name: Intubation Date/Time: 08/12/2023 10:51 AM  Performed by: Drema Pry, CRNAPre-anesthesia Checklist: Patient identified, Emergency Drugs available, Suction available and Patient being monitored Patient Re-evaluated:Patient Re-evaluated prior to induction Oxygen Delivery Method: Circle System Utilized Preoxygenation: Pre-oxygenation with 100% oxygen Induction Type: IV induction Ventilation: Mask ventilation without difficulty Laryngoscope Size: Mac and 4 Grade View: Grade I Tube type: Oral Tube size: 7.5 mm Number of attempts: 1 Airway Equipment and Method: Stylet Placement Confirmation: ETT inserted through vocal cords under direct vision, positive ETCO2 and breath sounds checked- equal and bilateral Secured at: 19 cm Tube secured with: Tape Dental Injury: Teeth and Oropharynx as per pre-operative assessment

## 2023-08-12 NOTE — Progress Notes (Signed)
At 0233, notified by [facility] that patient has a temp of 102.3 triggering yellow MEWS, notified Lenna Gilford RN and Dr. Evonnie Dawes, adjusted thermostat and removed extra blankets, administered scheduled antibiotics.    At 0333 temperature dropped to 99.8, ibuprofen administered, Dr. Evonnie Dawes updated. Saline bolus ordered and administered. MEWS escalation not done as temperature is now corrected.   08/12/23 0234 08/12/23 0333 08/12/23 0400  Assess: MEWS Score  Temp (!) 102.3 F (39.1 C) 99.8 F (37.7 C) 99.6 F (37.6 C)  BP 139/73  --  (!) 161/66  MAP (mmHg) 91  --  94  Pulse Rate 77  --  64  Resp 18  --  18  Level of Consciousness  --   --  Alert  SpO2 96 %  --  98 %  O2 Device Room Air  --  Room Air  Assess: MEWS Score  MEWS Temp 2 0 0  MEWS Systolic 0 0 0  MEWS Pulse 0 0 0  MEWS RR 0 0 0  MEWS LOC 0 0 0  MEWS Score 2 0 0  MEWS Score Color Yellow Green Green  Assess: if the MEWS score is Yellow or Red  Were vital signs accurate and taken at a resting state? Yes  --   --   Does the patient meet 2 or more of the SIRS criteria? No  --   --   Does the patient have a confirmed or suspected source of infection? No  --   --   MEWS guidelines implemented  No, previously yellow, continue vital signs every 4 hours  --   --   Notify: Charge Nurse/RN  Name of Charge Nurse/RN Notified Economist  --   --   Provider Notification  Provider Name/Title Omelia Blackwater  --   --   Date Provider Notified 08/12/23  --   --   Time Provider Notified 0236  --   --   Method of Notification Page  --   --   Notification Reason  (temp of 102.3)  --   --   Provider response See new orders  --   --   Date of Provider Response 08/12/23  --   --   Time of Provider Response 0245  --   --   Assess: SIRS CRITERIA  SIRS Temperature  1 0 0  SIRS Pulse 0 0 0  SIRS Respirations  0 0 0  SIRS WBC 0 0 0  SIRS Score Sum  1 0 0

## 2023-08-12 NOTE — Anesthesia Preprocedure Evaluation (Addendum)
Anesthesia Evaluation  Patient identified by [name], date of birth, ID band Patient awake    Reviewed: Allergy & Precautions, NPO status , Patient's Chart, lab work & pertinent test results  Airway Mallampati: I  TM Distance: >3 FB Neck ROM: Full    Dental  (+) Dental Advisory Given, Edentulous Lower, Edentulous Upper   Pulmonary sleep apnea and Continuous Positive Airway Pressure Ventilation , COPD,  COPD inhaler, former smoker   Pulmonary exam normal breath sounds clear to auscultation       Cardiovascular hypertension, Pt. on medications + angina  + CAD and + Past MI  Normal cardiovascular exam Rhythm:Regular Rate:Normal     Neuro/Psych  PSYCHIATRIC DISORDERS Anxiety Depression     Neuromuscular disease    GI/Hepatic hiatal hernia,GERD  Medicated,,Common bile duct stone   Endo/Other  negative endocrine ROS    Renal/GU negative Renal ROS   Prostate cancer     Musculoskeletal  (+) Arthritis ,    Abdominal   Peds  Hematology  (+) Blood dyscrasia (Thrombocytopenia), anemia   Anesthesia Other Findings Day of surgery medications reviewed with the patient.  Reproductive/Obstetrics                             Anesthesia Physical Anesthesia Plan  ASA: 3  Anesthesia Plan: General   Post-op Pain Management: Minimal or no pain anticipated   Induction: Intravenous  PONV Risk Score and Plan: 2 and Treatment may vary due to age or medical condition, Dexamethasone and Ondansetron  Airway Management Planned: Oral ETT  Additional Equipment:   Intra-op Plan:   Post-operative Plan: Extubation in OR  Informed Consent: I have reviewed the patients History and Physical, chart, labs and discussed the procedure including the risks, benefits and alternatives for the proposed anesthesia with the patient or authorized representative who has indicated his/her understanding and acceptance.     Dental  advisory given  Plan Discussed with: CRNA  Anesthesia Plan Comments:         Anesthesia Quick Evaluation

## 2023-08-12 NOTE — Interval H&P Note (Signed)
History and Physical Interval Note:  08/12/2023 10:38 AM  Albert Wade  has presented today for surgery, with the diagnosis of Common bile duct stone.  The various methods of treatment have been discussed with the patient and family. After consideration of risks, benefits and other options for treatment, the patient has consented to  Procedure(s): ENDOSCOPIC RETROGRADE CHOLANGIOPANCREATOGRAPHY (ERCP) (N/A) as a surgical intervention.  The patient's history has been reviewed, patient examined, no change in status, stable for surgery.  I have reviewed the patient's chart and labs.  Questions were answered to the patient's satisfaction.     Lynann Bologna

## 2023-08-12 NOTE — Anesthesia Postprocedure Evaluation (Signed)
Anesthesia Post Note  Patient: Albert Wade  Procedure(s) Performed: ENDOSCOPIC RETROGRADE CHOLANGIOPANCREATOGRAPHY (ERCP) SPHINCTEROTOMY REMOVAL OF STONES     Patient location during evaluation: PACU Anesthesia Type: General Level of consciousness: awake and alert Pain management: pain level controlled Vital Signs Assessment: post-procedure vital signs reviewed and stable Respiratory status: spontaneous breathing, nonlabored ventilation, respiratory function stable and patient connected to nasal cannula oxygen Cardiovascular status: blood pressure returned to baseline and stable Postop Assessment: no apparent nausea or vomiting Anesthetic complications: no   No notable events documented.  Last Vitals:  Vitals:   08/12/23 1200 08/12/23 1300  BP: (!) 142/54 (!) 138/59  Pulse: 68 (!) 58  Resp: 16 14  Temp:  36.6 C  SpO2: 91% 97%    Last Pain:  Vitals:   08/12/23 1300  TempSrc: Oral  PainSc: 0-No pain                 Collene Schlichter

## 2023-08-12 NOTE — Transfer of Care (Signed)
Immediate Anesthesia Transfer of Care Note  Patient: Albert Wade  Procedure(s) Performed: ENDOSCOPIC RETROGRADE CHOLANGIOPANCREATOGRAPHY (ERCP)  Patient Location: PACU and Endoscopy Unit  Anesthesia Type:General  Level of Consciousness: awake, alert , and patient cooperative  Airway & Oxygen Therapy: Patient Spontanous Breathing  Post-op Assessment: Report given to RN and Post -op Vital signs reviewed and stable  Post vital signs: Reviewed and stable  Last Vitals:  Vitals Value Taken Time  BP    Temp    Pulse 212 08/12/23 1142  Resp 13 08/12/23 1142  SpO2 81 % 08/12/23 1142  Vitals shown include unfiled device data.  Last Pain:  Vitals:   08/12/23 0951  TempSrc: Temporal  PainSc: 4       Patients Stated Pain Goal: 1 (08/11/23 1945)  Complications: No notable events documented.

## 2023-08-12 NOTE — Op Note (Signed)
[facility] Patient Name: Albert Wade Procedure Date : 08/12/2023 [name]: [id number] Attending MD: Lynann Bologna , MD, [id number] Date of Birth: [birth year] CSN: [id number] Age: 77 Admit Type: Inpatient Procedure:                ERCP Indications:              Bile duct stone(s) on MRCP. Providers:                Lynann Bologna, MD, Melany Guernsey, Technician Referring MD:             Merwyn Katos Medicines:                GA, IV Unasyn, glucagon 0.4 mg in divided doses.                            Indocin suppositories. Complications:            No immediate complications. Estimated Blood Loss:     Estimated blood loss: none. Procedure:                Pre-Anesthesia Assessment:                           - Prior to the procedure, a History and Physical                            was performed, and patient medications and                            allergies were reviewed. The patient's tolerance of                            previous anesthesia was also reviewed. The risks                            and benefits of the procedure and the sedation                            options and risks were discussed with the patient.                            All questions were answered, and informed consent                            was obtained. Prior Anticoagulants: The patient has                            taken no anticoagulant or antiplatelet agents. ASA                            Grade Assessment: II - A patient with mild systemic                            disease. After reviewing the risks and benefits,  the patient was deemed in satisfactory condition to                            undergo the procedure.                           After obtaining informed consent, the scope was                            passed under direct vision. Throughout the                            procedure, the patient's blood pressure, pulse, and                             oxygen saturations were monitored continuously. The                            [id number]-Q190V ([id number]) Olympus duodenoscope was                            introduced through the mouth, and used to inject                            contrast into and used to inject contrast into the                            bile duct. The ERCP was accomplished without                            difficulty. The patient tolerated the procedure                            well. Scope In: Scope Out: Findings:      The scout film was normal. The esophagus was successfully intubated       under direct vision. The scope was advanced to major papilla in the       descending duodenum without detailed examination of the pharynx, larynx       and associated structures, and upper GI tract. The upper GI tract was       grossly normal.      Periampullary diverticulum-major papilla located at the lower lip. The       bile duct was deeply cannulated with the 0.035 wire on first attempt       followed by deep cannulation with short-nosed traction sphincterotome.       Contrast was injected. I personally interpreted the bile duct images.       Ductal flow of contrast was adequate. Image quality was suboptimal.       Contrast extended to the entire biliary tree.      Small filling defect was noted in the distal CBD c/w       Choledocholithiasis. CBD was dilated to 12 mm. There was mild dilatation       of right and left hepatic ducts. Cystic duct was patent. Multiple       filling defects in the gallbladder  consistent with cholelithiasis. A 6       mm biliary sphincterotomy was made with a monofilament traction       (standard) sphincterotome using ERBE endocut. There was no       post-sphincterotomy bleeding. The biliary tree was swept with a 12 mm       balloon starting at the bifurcation several times. One small stone was       removed. Bile was green. No pus. Postocclusion cholangiogram did not       reveal any residual  filling defects.      Pancreatic was intentionally not cannulated or injected. Indocin       suppositories were given prior to insertion of the scope. Impression:               - Choledocholithiasis s/p biliary sphincterotomy                            with stone extraction. No ascending cholangitis                           - Cholelithiasis Recommendation:           - Return patient to hospital ward for ongoing care.                           - Surgical consultation for lap chole.                           - Trend LFTs.                           - Can continue antibiotics until lap chole.                           - Watch for pancreatitis, bleeding, perforation,                            and cholangitis.                           - The findings and recommendations were discussed                            with the patient's family. Procedure Code(s):        --- Professional ---                           [phone], Endoscopic retrograde                            cholangiopancreatography (ERCP); with removal of                            calculi/debris from biliary/pancreatic duct(s)                           [id number], Endoscopic retrograde  cholangiopancreatography (ERCP); with                            sphincterotomy/papillotomy                           [phone], Endoscopic catheterization of the biliary                            ductal system, radiological supervision and                            interpretation Diagnosis Code(s):        --- Professional ---                           K80.50, Calculus of bile duct without cholangitis                            or cholecystitis without obstruction CPT copyright 2022 [employer]. All rights reserved. The codes documented in this report are preliminary and upon coder review may  be revised to meet current compliance requirements. Lynann Bologna, MD 08/12/2023 11:46:30 AM This report has been signed  electronically. Number of Addenda: 0

## 2023-08-12 NOTE — Progress Notes (Signed)
Day of Surgery  Subjective: Still having epigastric pain but reports that it has improved somewhat.  ROS: See above, otherwise other systems negative  Objective: Vital signs in last 24 hours: Temp:  [97.7 F (36.5 C)-102.3 F (39.1 C)] 97.7 F (36.5 C) (08/17 0743) Pulse Rate:  [54-77] 54 (08/17 0743) Resp:  [16-18] 17 (08/17 0743) BP: (101-168)/(54-78) 168/70 (08/17 0743) SpO2:  [94 %-99 %] 98 % (08/17 0743) Weight:  [82 kg] 82 kg (08/16 1607) Last BM Date : 08/10/23  Intake/Output from previous day: 08/16 0701 - 08/17 0700 In: 1834.5 [P.O.:237; I.V.:801.4; IV Piggyback:796] Out: 3180 [Urine:3180] Intake/Output this shift: No intake/output data recorded.  PE: Gen: well appearing male, NAD Resp: equal chest rise, no increase WOB CV: RRR Abd: soft, mildly distended, TTP in the epigastric region, no rebound/guarding, no peritoneal signs  Lab Results:  Recent Labs    08/11/23 1640 08/12/23 0644  WBC 8.9 6.1  HGB 10.6* 10.2*  HCT 33.0* 31.2*  PLT 135* 123*   BMET Recent Labs    08/10/23 2248 08/11/23 1640 08/12/23 0644  NA 137  --  141  K 3.9  --  3.5  CL 104  --  108  CO2 24  --  24  GLUCOSE 125*  --  108*  BUN 19  --  8  CREATININE 1.01 0.83 0.97  CALCIUM 7.8*  --  8.5*   PT/INR Recent Labs    08/10/23 2248 08/11/23 1640  LABPROT 14.6 15.2  INR 1.1 1.2   CMP     Component Value Date/Time   NA 141 08/12/2023 0644   NA 146 (H) 01/16/2023 1045   K 3.5 08/12/2023 0644   CL 108 08/12/2023 0644   CO2 24 08/12/2023 0644   GLUCOSE 108 (H) 08/12/2023 0644   BUN 8 08/12/2023 0644   BUN 10 01/16/2023 1045   CREATININE 0.97 08/12/2023 0644   CREATININE 1.06 03/04/2016 1618   CALCIUM 8.5 (L) 08/12/2023 0644   PROT 5.5 (L) 08/12/2023 0644   ALBUMIN 2.8 (L) 08/12/2023 0644   AST 74 (H) 08/12/2023 0644   ALT 94 (H) 08/12/2023 0644   ALKPHOS 179 (H) 08/12/2023 0644   BILITOT 2.9 (H) 08/12/2023 0644   GFRNONAA >60 08/12/2023 0644   GFRAA >60  06/02/2020 0357   Lipase     Component Value Date/Time   LIPASE 32 08/10/2023 2248    Studies/Results: DG Chest Port 1 View  Result Date: 08/12/2023 CLINICAL DATA:  Shortness of breath. EXAM: PORTABLE CHEST 1 VIEW COMPARISON:  08/10/2023 FINDINGS: The lungs are clear without focal pneumonia, edema, pneumothorax or pleural effusion. The cardiopericardial silhouette is within normal limits for size. No acute bony abnormality. Telemetry leads overlie the chest. IMPRESSION: No active disease. Electronically Signed   By: Kennith Center M.D.   On: 08/12/2023 07:17   MR ABDOMEN MRCP W WO CONTAST  Result Date: 08/11/2023 CLINICAL DATA:  Biliary dilatation with elevated LFTs. EXAM: MRI ABDOMEN WITHOUT AND WITH CONTRAST (INCLUDING MRCP) TECHNIQUE: Multiplanar multisequence MR imaging of the abdomen was performed both before and after the administration of intravenous contrast. Heavily T2-weighted images of the biliary and pancreatic ducts were obtained, and three-dimensional MRCP images were rendered by post processing. CONTRAST:  8mL GADAVIST GADOBUTROL 1 MMOL/ML IV SOLN COMPARISON:  CT scan from earlier same day FINDINGS: Lower chest: Unremarkable. Hepatobiliary: No suspicious focal abnormality within the liver parenchyma. No gallbladder wall thickening or pericholecystic fluid. Several very tiny 1-2 mm stones are  seen in the dependent gallbladder lumen towards the neck (see axial T2 image 14 of series 9. MRCP imaging is markedly motion degraded. Common duct measures 9 mm diameter. Common bile duct in the head of the pancreas is 7-8 mm diameter. Potential 2 mm stone in the distal common bile duct just proximal to the ampulla (see axial T2 image 19 of series 9). Pancreas: No main duct dilatation. No focal mass lesion. Axial T2 haste imaging raises the question of some mild peripancreatic edema in the head and body region. Spleen:  No splenomegaly. No suspicious focal mass lesion. Adrenals/Urinary Tract: No  adrenal nodule or mass. No suspicious enhancing renal mass. Mild right hydroureteronephrosis. Stomach/Bowel: Stomach is moderately distended with food and fluid. Duodenum is normally positioned as is the ligament of Treitz. No small bowel or colonic dilatation within the visualized abdomen. Vascular/Lymphatic: No abdominal aortic aneurysm. 14 mm short axis hepatoduodenal ligament lymph node seen on 19/4, presumably reactive. 14 mm short axis celiac axis node seen on 16/4. Other:  No intraperitoneal free fluid. Musculoskeletal: No focal suspicious marrow enhancement within the visualized bony anatomy. IMPRESSION: 1. MRCP imaging is markedly motion degraded. Common bile duct in the head of the pancreas measures 7-8 mm diameter. Potential 2 mm stone in the distal common bile duct just proximal to the ampulla. 2. Several very tiny 1-2 mm stones in the dependent gallbladder lumen towards the neck. No gallbladder wall thickening or pericholecystic fluid. 3. Question mild peripancreatic edema in the head and body region. Correlation with lipase recommended. 4. Mild right hydroureteronephrosis. Etiology for this finding is not evident on the current abdominal MRI. 5. Mildly enlarged hepatoduodenal ligament and celiac axis lymph nodes, presumably reactive. Consider follow-up CT in 3 months to ensure stability. Electronically Signed   By: Kennith Center M.D.   On: 08/11/2023 06:29   MR 3D Recon At Scanner  Result Date: 08/11/2023 CLINICAL DATA:  Biliary dilatation with elevated LFTs. EXAM: MRI ABDOMEN WITHOUT AND WITH CONTRAST (INCLUDING MRCP) TECHNIQUE: Multiplanar multisequence MR imaging of the abdomen was performed both before and after the administration of intravenous contrast. Heavily T2-weighted images of the biliary and pancreatic ducts were obtained, and three-dimensional MRCP images were rendered by post processing. CONTRAST:  8mL GADAVIST GADOBUTROL 1 MMOL/ML IV SOLN COMPARISON:  CT scan from earlier same day  FINDINGS: Lower chest: Unremarkable. Hepatobiliary: No suspicious focal abnormality within the liver parenchyma. No gallbladder wall thickening or pericholecystic fluid. Several very tiny 1-2 mm stones are seen in the dependent gallbladder lumen towards the neck (see axial T2 image 14 of series 9. MRCP imaging is markedly motion degraded. Common duct measures 9 mm diameter. Common bile duct in the head of the pancreas is 7-8 mm diameter. Potential 2 mm stone in the distal common bile duct just proximal to the ampulla (see axial T2 image 19 of series 9). Pancreas: No main duct dilatation. No focal mass lesion. Axial T2 haste imaging raises the question of some mild peripancreatic edema in the head and body region. Spleen:  No splenomegaly. No suspicious focal mass lesion. Adrenals/Urinary Tract: No adrenal nodule or mass. No suspicious enhancing renal mass. Mild right hydroureteronephrosis. Stomach/Bowel: Stomach is moderately distended with food and fluid. Duodenum is normally positioned as is the ligament of Treitz. No small bowel or colonic dilatation within the visualized abdomen. Vascular/Lymphatic: No abdominal aortic aneurysm. 14 mm short axis hepatoduodenal ligament lymph node seen on 19/4, presumably reactive. 14 mm short axis celiac axis node seen on 16/4.  Other:  No intraperitoneal free fluid. Musculoskeletal: No focal suspicious marrow enhancement within the visualized bony anatomy. IMPRESSION: 1. MRCP imaging is markedly motion degraded. Common bile duct in the head of the pancreas measures 7-8 mm diameter. Potential 2 mm stone in the distal common bile duct just proximal to the ampulla. 2. Several very tiny 1-2 mm stones in the dependent gallbladder lumen towards the neck. No gallbladder wall thickening or pericholecystic fluid. 3. Question mild peripancreatic edema in the head and body region. Correlation with lipase recommended. 4. Mild right hydroureteronephrosis. Etiology for this finding is not  evident on the current abdominal MRI. 5. Mildly enlarged hepatoduodenal ligament and celiac axis lymph nodes, presumably reactive. Consider follow-up CT in 3 months to ensure stability. Electronically Signed   By: Kennith Center M.D.   On: 08/11/2023 06:29   MR Lumbar Spine W Wo Contrast  Result Date: 08/11/2023 CLINICAL DATA:  MSSA seeding of hardware. EXAM: MRI LUMBAR SPINE WITHOUT AND WITH CONTRAST TECHNIQUE: Multiplanar and multiecho pulse sequences of the lumbar spine were obtained without and with intravenous contrast. CONTRAST:  8mL GADAVIST GADOBUTROL 1 MMOL/ML IV SOLN COMPARISON:  05/05/2023 FINDINGS: Segmentation:  Standard. Alignment:  Minimal dextrocurvature Vertebrae: L4-5 and L5-S1 fusion with solid intervertebral arthrodesis. Minor marrow edema in the endplates at L3-4, likely degenerative in the absence of paravertebral edema or disc T2 hyperintensity and in the presence of vacuum phenomenon by prior CT. Postcontrast assessment is limited by metal artifact. Conus medullaris and cauda equina: Conus extends to the L2 level. Conus and cauda equina appear normal. Paraspinal and other soft tissues: Full urinary bladder and right hydronephrosis. The right ureter appears reimplanted and this may be from stricture or reflux. There is contemporaneous abdominal CT. Disc levels: T12- L1: Ventral spondylitic spurring. L1-L2: Disc narrowing and bulging with biforaminal protrusion. Bilateral facet spurring. Moderate left and mild-to-moderate right foraminal narrowing. L2-L3: Circumferential disc bulging. Degenerative facet spurring on both sides. Mild bilateral foraminal narrowing. L3-L4: Disc degeneration with endplate ridging. Facet spurring asymmetric to the left. Moderate left more than right foraminal narrowing. Widely patent spinal canal after laminectomy. L4-L5: Fusion without impingement L5-S1:Fusion without impingement IMPRESSION: 1. No acute finding.  No clear sign of spinal infection. 2. Degenerated  and postoperative lumbar spine with up to moderate foraminal narrowing at L1-2 and L3-4. Diffusely patent spinal canal. Electronically Signed   By: Tiburcio Pea M.D.   On: 08/11/2023 04:39   US ABDOMEN LIMITED RUQ (LIVER/GB)  Result Date: 08/11/2023 CLINICAL DATA:  Elevated LFTs EXAM: ULTRASOUND ABDOMEN LIMITED RIGHT UPPER QUADRANT COMPARISON:  CT from earlier in the same day. FINDINGS: Gallbladder: Gallbladder is well distended. No wall thickening or pericholecystic fluid is noted. Small dependent gallstones are seen. Negative sonographic Murphy's sign is elicited. Common bile duct: Diameter: 12 mm tapering to 7 mm. No intrahepatic ductal dilatation is seen. Liver: No focal lesion identified. Within normal limits in parenchymal echogenicity. Portal vein is patent on color Doppler imaging with normal direction of blood flow towards the liver. Other: None. IMPRESSION: Prominent common bile duct. Distal common bile duct stone could not be totally excluded. Cholelithiasis without complicating factors. Electronically Signed   By: Alcide Clever M.D.   On: 08/11/2023 02:53   CT ABDOMEN PELVIS W CONTRAST  Result Date: 08/11/2023 CLINICAL DATA:  Abdominal pain, acute, nonlocalized fever, abd pain EXAM: CT ABDOMEN AND PELVIS WITH CONTRAST TECHNIQUE: Multidetector CT imaging of the abdomen and pelvis was performed using the standard protocol following bolus administration of intravenous  contrast. RADIATION DOSE REDUCTION: This exam was performed according to the departmental dose-optimization program which includes automated exposure control, adjustment of the mA and/or kV according to patient size and/or use of iterative reconstruction technique. CONTRAST:  OMNIPAQUE IOHEXOL 300 MG/ML  SOLN COMPARISON:  CT lumbar spine 02/23/2023, CT abdomen pelvis 10/14/2021 FINDINGS: Lower chest: No acute abnormality. Hepatobiliary: No focal liver abnormality. No gallstones, gallbladder wall thickening, or pericholecystic  fluid. No biliary dilatation. Pancreas: Diffusely atrophic. No focal lesion. Otherwise normal pancreatic contour. No surrounding inflammatory changes. No main pancreatic ductal dilatation. Spleen: Splenule noted normal in size without focal abnormality. Adrenals/Urinary Tract: No adrenal nodule bilaterally. Bilateral kidneys enhance symmetrically. Mild right hydroureteronephrosis with abrupt change in caliber at the distal ureter (2:55). No right nephroureterolithiasis. No right hydroureteronephrosis. The urinary bladder is unremarkable. On delayed imaging, there is no urothelial wall thickening and there are no filling defects in the opacified portions of the bilateral collecting systems or ureters. Stomach/Bowel: Stomach is within normal limits. No evidence of bowel wall thickening or dilatation. The appendix is not definitely identified with no inflammatory changes in the right lower quadrant to suggest acute appendicitis. Vascular/Lymphatic: No abdominal aorta or iliac aneurysm. Moderate to severe atherosclerotic plaque of the aorta and its branches. Prominent but nonenlarged mesenteric and retroperitoneal lymphadenopathy with vague small bowel mesenteric haziness. No abdominal, pelvic, or inguinal lymphadenopathy. Reproductive: Prostate is unremarkable. Other: No intraperitoneal free fluid. No intraperitoneal free gas. No organized fluid collection. Musculoskeletal: No abdominal wall hernia or abnormality. Diffusely decreased bone density. No suspicious lytic or blastic osseous lesions. No acute displaced fracture. L4-L5 and L5-S1 interbody surgical hardware. IMPRESSION: 1. Mild right hydroureteronephrosis with abrupt change in caliber at the distal ureter. No nephroureterolithiasis. This may reflect the residual of a recently passed calculus or reflect chronic changes of obstructive uropathy or reflux. 2. Diffusely decreased bone density. 3. Nonspecific small bowel misty mesentery. Electronically Signed   By:  Tish Frederickson M.D.   On: 08/11/2023 01:40   DG Chest Port 1 View  Result Date: 08/10/2023 CLINICAL DATA:  Fever history of liver and kidney disease EXAM: PORTABLE CHEST 1 VIEW COMPARISON:  08/23/2022 FINDINGS: Borderline to mild cardiomegaly. No acute airspace disease or effusion. No pneumothorax. IMPRESSION: No active disease. Borderline to mild cardiomegaly. Electronically Signed   By: Jasmine Pang M.D.   On: 08/10/2023 23:27    Anti-infectives: Anti-infectives (From admission, onward)    Start     Dose/Rate Route Frequency Ordered Stop   08/11/23 1900  piperacillin-tazobactam (ZOSYN) IVPB 3.375 g        3.375 g 12.5 mL/hr over 240 Minutes Intravenous Every 8 hours 08/11/23 1716         Assessment/Plan 77 y/o M w/ a hx of open appendectomy, alp converted to open prostatectomy, and umbilical hernia repair who presented with pancreatitis and choledocholithiasis  - ERCP today, will follow up the results - Tentative plan for cholecystectomy prior to discharge. - Surgery will continue to follow  I reviewed last 24 h vitals and pain scores, last 48 h intake and output, last 24 h labs and trends, and last 24 h imaging results.  This care required moderate level of medical decision making.    LOS: 1 day   T[facility] 08/12/2023, 8:29 AM Please see Amion for pager number during day hours 7:00am-4:30pm or 7:00am -11:30am on weekends

## 2023-08-13 ENCOUNTER — Inpatient Hospital Stay (HOSPITAL_COMMUNITY): Payer: Medicare Other | Admitting: Certified Registered Nurse Anesthetist

## 2023-08-13 ENCOUNTER — Encounter (HOSPITAL_COMMUNITY): Payer: Self-pay | Admitting: Internal Medicine

## 2023-08-13 ENCOUNTER — Other Ambulatory Visit: Payer: Self-pay

## 2023-08-13 ENCOUNTER — Encounter (HOSPITAL_COMMUNITY)
Disposition: A | Discharge status: Home / Self Care | Payer: Self-pay | Source: Other Acute Inpatient Hospital | Attending: Internal Medicine

## 2023-08-13 DIAGNOSIS — K8020 Calculus of gallbladder without cholecystitis without obstruction: Secondary | ICD-10-CM

## 2023-08-13 DIAGNOSIS — K8050 Calculus of bile duct without cholangitis or cholecystitis without obstruction: Secondary | ICD-10-CM

## 2023-08-13 DIAGNOSIS — K819 Cholecystitis, unspecified: Secondary | ICD-10-CM

## 2023-08-13 DIAGNOSIS — K8309 Other cholangitis: Secondary | ICD-10-CM | POA: Diagnosis not present

## 2023-08-13 HISTORY — PX: CHOLECYSTECTOMY: SHX55

## 2023-08-13 LAB — CBC WITH DIFFERENTIAL/PLATELET
Abs Immature Granulocytes: 0.02 10*3/uL (ref 0.00–0.07)
Basophils Absolute: 0 10*3/uL (ref 0.0–0.1)
Basophils Relative: 0 %
Eosinophils Absolute: 0 10*3/uL (ref 0.0–0.5)
Eosinophils Relative: 0 %
HCT: 32.9 % — ABNORMAL LOW (ref 39.0–52.0)
Hemoglobin: 11 g/dL — ABNORMAL LOW (ref 13.0–17.0)
Immature Granulocytes: 0 %
Lymphocytes Relative: 11 %
Lymphs Abs: 0.6 10*3/uL — ABNORMAL LOW (ref 0.7–4.0)
MCH: 28.1 pg (ref 26.0–34.0)
MCHC: 33.4 g/dL (ref 30.0–36.0)
MCV: 84.1 fL (ref 80.0–100.0)
Monocytes Absolute: 0.3 10*3/uL (ref 0.1–1.0)
Monocytes Relative: 6 %
Neutro Abs: 4.3 10*3/uL (ref 1.7–7.7)
Neutrophils Relative %: 83 %
Platelets: 142 10*3/uL — ABNORMAL LOW (ref 150–400)
RBC: 3.91 MIL/uL — ABNORMAL LOW (ref 4.22–5.81)
RDW: 15.5 % (ref 11.5–15.5)
WBC: 5.3 10*3/uL (ref 4.0–10.5)
nRBC: 0 % (ref 0.0–0.2)

## 2023-08-13 LAB — COMPREHENSIVE METABOLIC PANEL WITH GFR
ALT: 74 U/L — ABNORMAL HIGH (ref 0–44)
AST: 38 U/L (ref 15–41)
Albumin: 2.8 g/dL — ABNORMAL LOW (ref 3.5–5.0)
Alkaline Phosphatase: 173 U/L — ABNORMAL HIGH (ref 38–126)
Anion gap: 10 (ref 5–15)
BUN: 10 mg/dL (ref 8–23)
CO2: 24 mmol/L (ref 22–32)
Calcium: 8.8 mg/dL — ABNORMAL LOW (ref 8.9–10.3)
Chloride: 104 mmol/L (ref 98–111)
Creatinine, Ser: 0.82 mg/dL (ref 0.61–1.24)
GFR, Estimated: >60 mL/min
Glucose, Bld: 123 mg/dL — ABNORMAL HIGH (ref 70–99)
Potassium: 3.7 mmol/L (ref 3.5–5.1)
Sodium: 138 mmol/L (ref 135–145)
Total Bilirubin: 1.4 mg/dL — ABNORMAL HIGH (ref 0.3–1.2)
Total Protein: 5.8 g/dL — ABNORMAL LOW (ref 6.5–8.1)

## 2023-08-13 LAB — TSH: TSH: 1.184 u[IU]/mL (ref 0.350–4.500)

## 2023-08-13 LAB — MAGNESIUM: Magnesium: 2 mg/dL (ref 1.7–2.4)

## 2023-08-13 LAB — BRAIN NATRIURETIC PEPTIDE: B Natriuretic Peptide: 409.9 pg/mL — ABNORMAL HIGH (ref 0.0–100.0)

## 2023-08-13 LAB — PROCALCITONIN: Procalcitonin: 2.68 ng/mL

## 2023-08-13 LAB — C-REACTIVE PROTEIN: CRP: 10.5 mg/dL — ABNORMAL HIGH

## 2023-08-13 SURGERY — LAPAROSCOPIC CHOLECYSTECTOMY
Anesthesia: General

## 2023-08-13 MED ORDER — DEXAMETHASONE SODIUM PHOSPHATE 10 MG/ML IJ SOLN
INTRAMUSCULAR | Status: DC | PRN
  Administered 2023-08-13: 5 mg via INTRAVENOUS

## 2023-08-13 MED ORDER — PROPOFOL 10 MG/ML IV BOLUS
INTRAVENOUS | Status: DC | PRN
  Administered 2023-08-13: 120 mg via INTRAVENOUS

## 2023-08-13 MED ORDER — EPHEDRINE 5 MG/ML INJ
INTRAVENOUS | Status: AC
  Filled 2023-08-13: qty 5

## 2023-08-13 MED ORDER — PHENYLEPHRINE 80 MCG/ML (10ML) SYRINGE FOR IV PUSH (FOR BLOOD PRESSURE SUPPORT)
PREFILLED_SYRINGE | INTRAVENOUS | Status: AC
  Filled 2023-08-13: qty 20

## 2023-08-13 MED ORDER — LIDOCAINE 2% (20 MG/ML) 5 ML SYRINGE
INTRAMUSCULAR | Status: DC | PRN
  Administered 2023-08-13: 100 mg via INTRAVENOUS

## 2023-08-13 MED ORDER — CHLORHEXIDINE GLUCONATE CLOTH 2 % EX PADS
6.0000 | MEDICATED_PAD | Freq: Once | CUTANEOUS | Status: AC
  Administered 2023-08-13: 6 via TOPICAL

## 2023-08-13 MED ORDER — PROCHLORPERAZINE EDISYLATE 10 MG/2ML IJ SOLN: 10.0000 mg | INTRAMUSCULAR | Status: DC | PRN

## 2023-08-13 MED ORDER — METHOCARBAMOL 1000 MG/10ML IJ SOLN: 500.0000 mg | Freq: Four times a day (QID) | INTRAVENOUS | Status: DC | PRN

## 2023-08-13 MED ORDER — LACTATED RINGERS IV SOLN: INTRAVENOUS | Status: DC | PRN

## 2023-08-13 MED ORDER — FENTANYL CITRATE (PF) 100 MCG/2ML IJ SOLN: 25.0000 ug | INTRAMUSCULAR | Status: DC | PRN

## 2023-08-13 MED ORDER — FENTANYL CITRATE (PF) 250 MCG/5ML IJ SOLN
INTRAMUSCULAR | Status: DC | PRN
  Administered 2023-08-13: 100 ug via INTRAVENOUS
  Administered 2023-08-13: 50 ug via INTRAVENOUS

## 2023-08-13 MED ORDER — EPHEDRINE SULFATE-NACL 50-0.9 MG/10ML-% IV SOSY
PREFILLED_SYRINGE | INTRAVENOUS | Status: DC | PRN
  Administered 2023-08-13 (×2): 5 mg via INTRAVENOUS

## 2023-08-13 MED ORDER — GABAPENTIN 300 MG PO CAPS
300.0000 mg | ORAL_CAPSULE | Freq: Three times a day (TID) | ORAL | Status: DC
  Administered 2023-08-13 – 2023-08-14 (×2): 300 mg via ORAL
  Filled 2023-08-13 (×2): qty 1

## 2023-08-13 MED ORDER — ONDANSETRON HCL 4 MG/2ML IJ SOLN
INTRAMUSCULAR | Status: DC | PRN
  Administered 2023-08-13: 4 mg via INTRAVENOUS

## 2023-08-13 MED ORDER — CEFAZOLIN SODIUM-DEXTROSE 2-4 GM/100ML-% IV SOLN
2.0000 g | INTRAVENOUS | Status: AC
  Administered 2023-08-13: 2 g via INTRAVENOUS

## 2023-08-13 MED ORDER — DOCUSATE SODIUM 100 MG PO CAPS
100.0000 mg | ORAL_CAPSULE | Freq: Two times a day (BID) | ORAL | Status: DC
  Administered 2023-08-13 – 2023-08-14 (×2): 100 mg via ORAL
  Filled 2023-08-13 (×2): qty 1

## 2023-08-13 MED ORDER — OXYCODONE HCL 5 MG PO TABS: 5.0000 mg | ORAL_TABLET | Freq: Once | ORAL | Status: DC | PRN

## 2023-08-13 MED ORDER — ONDANSETRON HCL 4 MG/2ML IJ SOLN: 4.0000 mg | Freq: Once | INTRAMUSCULAR | Status: DC | PRN

## 2023-08-13 MED ORDER — SODIUM CHLORIDE 0.9 % IR SOLN
Status: DC | PRN
  Administered 2023-08-13: 1000 mL

## 2023-08-13 MED ORDER — OXYCODONE HCL 5 MG/5ML PO SOLN: 5.0000 mg | Freq: Once | ORAL | Status: DC | PRN

## 2023-08-13 MED ORDER — BUPIVACAINE HCL 0.25 % IJ SOLN
INTRAMUSCULAR | Status: DC | PRN
  Administered 2023-08-13: 30 mL

## 2023-08-13 MED ORDER — FENTANYL CITRATE (PF) 250 MCG/5ML IJ SOLN
INTRAMUSCULAR | Status: AC
  Filled 2023-08-13: qty 5

## 2023-08-13 MED ORDER — DEXMEDETOMIDINE HCL IN NACL 80 MCG/20ML IV SOLN
INTRAVENOUS | Status: AC
  Filled 2023-08-13: qty 20

## 2023-08-13 MED ORDER — ONDANSETRON HCL 4 MG/2ML IJ SOLN
INTRAMUSCULAR | Status: AC
  Filled 2023-08-13: qty 6

## 2023-08-13 MED ORDER — PROPOFOL 10 MG/ML IV BOLUS
INTRAVENOUS | Status: AC
  Filled 2023-08-13: qty 20

## 2023-08-13 MED ORDER — CEFAZOLIN SODIUM-DEXTROSE 2-4 GM/100ML-% IV SOLN
INTRAVENOUS | Status: AC
  Filled 2023-08-13: qty 100

## 2023-08-13 MED ORDER — ROCURONIUM BROMIDE 10 MG/ML (PF) SYRINGE
PREFILLED_SYRINGE | INTRAVENOUS | Status: AC
  Filled 2023-08-13: qty 10

## 2023-08-13 MED ORDER — ROCURONIUM BROMIDE 10 MG/ML (PF) SYRINGE
PREFILLED_SYRINGE | INTRAVENOUS | Status: DC | PRN
  Administered 2023-08-13: 70 mg via INTRAVENOUS

## 2023-08-13 MED ORDER — SIMETHICONE 80 MG PO CHEW: 80.0000 mg | CHEWABLE_TABLET | Freq: Four times a day (QID) | ORAL | Status: DC | PRN

## 2023-08-13 MED ORDER — SUGAMMADEX SODIUM 200 MG/2ML IV SOLN
INTRAVENOUS | Status: DC | PRN
  Administered 2023-08-13: 200 mg via INTRAVENOUS

## 2023-08-13 MED ORDER — OXYCODONE HCL 5 MG PO TABS: 5.0000 mg | ORAL_TABLET | ORAL | Status: DC | PRN

## 2023-08-13 MED ORDER — ACETAMINOPHEN 10 MG/ML IV SOLN
INTRAVENOUS | Status: AC
  Filled 2023-08-13: qty 100

## 2023-08-13 MED ORDER — ONDANSETRON HCL 4 MG/2ML IJ SOLN: 4.0000 mg | Freq: Four times a day (QID) | INTRAMUSCULAR | Status: DC | PRN

## 2023-08-13 MED ORDER — ACETAMINOPHEN 10 MG/ML IV SOLN
INTRAVENOUS | Status: DC | PRN
  Administered 2023-08-13: 1000 mg via INTRAVENOUS

## 2023-08-13 MED ORDER — 0.9 % SODIUM CHLORIDE (POUR BTL) OPTIME
TOPICAL | Status: DC | PRN
  Administered 2023-08-13: 1000 mL

## 2023-08-13 MED ORDER — DEXAMETHASONE SODIUM PHOSPHATE 10 MG/ML IJ SOLN
INTRAMUSCULAR | Status: AC
  Filled 2023-08-13: qty 1

## 2023-08-13 MED ORDER — OXYCODONE HCL 5 MG PO TABS: 10.0000 mg | ORAL_TABLET | ORAL | Status: DC | PRN

## 2023-08-13 MED ORDER — ACETAMINOPHEN 325 MG PO TABS
650.0000 mg | ORAL_TABLET | Freq: Four times a day (QID) | ORAL | Status: DC
  Administered 2023-08-14 (×2): 650 mg via ORAL
  Filled 2023-08-13 (×2): qty 2

## 2023-08-13 MED ORDER — CHLORHEXIDINE GLUCONATE 0.12 % MT SOLN
OROMUCOSAL | Status: AC
  Administered 2023-08-13: 15 mL
  Filled 2023-08-13: qty 15

## 2023-08-13 MED ORDER — LIDOCAINE 2% (20 MG/ML) 5 ML SYRINGE
INTRAMUSCULAR | Status: AC
  Filled 2023-08-13: qty 15

## 2023-08-13 MED ORDER — KETOROLAC TROMETHAMINE 30 MG/ML IJ SOLN
INTRAMUSCULAR | Status: AC
  Filled 2023-08-13: qty 1

## 2023-08-13 MED ORDER — HYDROMORPHONE HCL 1 MG/ML IJ SOLN: 0.5000 mg | INTRAMUSCULAR | Status: DC | PRN

## 2023-08-13 MED ORDER — BUPIVACAINE HCL (PF) 0.25 % IJ SOLN
INTRAMUSCULAR | Status: AC
  Filled 2023-08-13: qty 30

## 2023-08-13 SURGICAL SUPPLY — 42 items
ADH SKN CLS APL DERMABOND .7 (GAUZE/BANDAGES/DRESSINGS) ×1
APL PRP STRL LF DISP 70% ISPRP (MISCELLANEOUS) ×1
APPLIER CLIP ROT 10 11.4 M/L (STAPLE) ×1
APR CLP MED LRG 11.4X10 (STAPLE) ×1
BAG COUNTER SPONGE SURGICOUNT (BAG) ×1
BAG SPEC RTRVL 10 TROC 200 (ENDOMECHANICALS) ×1
BAG SPNG CNTER NS LX DISP (BAG) ×1
CANISTER SUCT 3000ML PPV (MISCELLANEOUS) ×1
CHLORAPREP W/TINT 26 (MISCELLANEOUS) ×1
COVER SURGICAL LIGHT HANDLE (MISCELLANEOUS) ×1
DERMABOND ADVANCED .7 DNX12 (GAUZE/BANDAGES/DRESSINGS) ×1
ELECT REM PT RETURN 9FT ADLT (ELECTROSURGICAL) ×1
ENDOLOOP SUT PDS II 0 18 (SUTURE) ×1
GLOVE BIO SURGEON STRL SZ7.5 (GLOVE) ×1
GLOVE BIOGEL PI IND STRL 8 (GLOVE) ×1
GOWN STRL REUS W/ TWL LRG LVL3 (GOWN DISPOSABLE) ×2
GOWN STRL REUS W/ TWL XL LVL3 (GOWN DISPOSABLE) ×1
GOWN STRL REUS W/TWL LRG LVL3 (GOWN DISPOSABLE) ×2
GOWN STRL REUS W/TWL XL LVL3 (GOWN DISPOSABLE) ×1
GRASPER SUT TROCAR 14GX15 (MISCELLANEOUS) ×1
IRRIG SUCT STRYKERFLOW 2 WTIP (MISCELLANEOUS) ×1
KIT BASIN OR (CUSTOM PROCEDURE TRAY) ×1
KIT IMAGING PINPOINTPAQ (MISCELLANEOUS)
KIT TURNOVER KIT B (KITS) ×1
NDL 22X1.5 STRL (OR ONLY) (MISCELLANEOUS) ×1 IMPLANT
NDL INSUFFLATION 14GA 120MM (NEEDLE) ×1 IMPLANT
NEEDLE 22X1.5 STRL (OR ONLY) (MISCELLANEOUS) ×1
NEEDLE INSUFFLATION 14GA 120MM (NEEDLE) ×1
NS IRRIG 1000ML POUR BTL (IV SOLUTION) ×1
PAD ARMBOARD 7.5X6 YLW CONV (MISCELLANEOUS) ×1
POUCH RETRIEVAL ECOSAC 10 (ENDOMECHANICALS) ×1
SCISSORS LAP 5X35 DISP (ENDOMECHANICALS) ×1
SET TUBE SMOKE EVAC HIGH FLOW (TUBING) ×1
SLEEVE Z-THREAD 5X100MM (TROCAR) ×2
SUT MNCRL AB 4-0 PS2 18 (SUTURE) ×1
TOWEL GREEN STERILE (TOWEL DISPOSABLE) ×1
TOWEL GREEN STERILE FF (TOWEL DISPOSABLE) ×1
TRAY LAPAROSCOPIC MC (CUSTOM PROCEDURE TRAY) ×1
TROCAR 11X100 Z THREAD (TROCAR) ×1
TROCAR Z-THREAD OPTICAL 5X100M (TROCAR) ×1
WARMER LAPAROSCOPE (MISCELLANEOUS) ×1
WATER STERILE IRR 1000ML POUR (IV SOLUTION)

## 2023-08-13 NOTE — Transfer of Care (Signed)
Immediate Anesthesia Transfer of Care Note  Patient: Albert Wade  Procedure(s) Performed: LAPAROSCOPIC CHOLECYSTECTOMY  Patient Location: PACU  Anesthesia Type:General  Level of Consciousness: awake and alert   Airway & Oxygen Therapy: Patient Spontanous Breathing and Patient connected to face mask oxygen  Post-op Assessment: Report given to RN and Post -op Vital signs reviewed and stable  Post vital signs: Reviewed and stable  Last Vitals:  Vitals Value Taken Time  BP 157/53 08/13/23 1727  Temp 36.5 C 08/13/23 1727  Pulse 81 08/13/23 1727  Resp 20 08/13/23 1727  SpO2 99 % 08/13/23 1727    Last Pain:  Vitals:   08/13/23 2107  TempSrc: Oral  PainSc:       Patients Stated Pain Goal: 1 (08/11/23 1945)  Complications: No notable events documented.

## 2023-08-13 NOTE — Op Note (Signed)
Patient: Albert Wade (1946-08-02, [id number])  Date of Surgery: 08/13/23  Preoperative Diagnosis: Cholecystitis   Postoperative Diagnosis: Cholecystitis   Surgical Procedure: LAPAROSCOPIC CHOLECYSTECTOMY:    Operative Team Members:  Surgeons and Role:    * Jameeka Marcy, Hyman Hopes, MD - Primary   Anesthesiologist: Mariann Barter, MD CRNA: Tressia Miners, CRNA   Anesthesia: General   Fluids:  Total I/O In: 200 [IV Piggyback:200] Out: -   Complications: None  Drains:  none   Specimen:  ID Type Source Tests Collected by Time Destination  1 : Gallbladder Tissue PATH Gallbladder SURGICAL PATHOLOGY Chey Cho, Hyman Hopes, MD 08/13/2023 1655      Disposition:  PACU - hemodynamically stable.  Plan of Care:  Okay for discharge home    Indications for Procedure: CORRION SMISEK is a 77 y.o. male who presented with abdominal pain.  History, physical and imaging was concerning for gallstone pancreatitis.  ERCP was completed and the patient's symptoms resolved..  Laparoscopic cholecystectomy was recommended for the patient.  The procedure itself, as well as the risks, benefits and alternatives were discussed with the patient.  Risks discussed included but were not limited to the risk of infection, bleeding, damage to nearby structures, need to convert to open procedure, incisional hernia, bile leak, common bile duct injury and the need for additional procedures or surgeries.  With this discussion complete and all questions answered the patient granted consent to proceed.  Findings: Gallstones  Infection status: Patient: [facility] Patient Case: Urgent Infection Present At Time Of Surgery (PATOS): None   Description of Procedure:   On the date stated above, the patient was taken to the operating room suite and placed in supine positioning.  Sequential compression devices were placed on the lower extremities to prevent blood clots.  General  endotracheal anesthesia was induced. Preoperative antibiotics were given.  The patient's abdomen was prepped and draped in the usual sterile fashion.  A time-out was completed verifying the correct patient, procedure, positioning and equipment needed for the case.  We began by anesthetizing the skin with local anesthetic and then making a 5 mm incision just below the umbilicus.  We dissected through the subcutaneous tissues to the fascia.  The fascia was grasped and elevated using a Kocher clamp.  A Veress needle was inserted into the abdomen and the abdomen was insufflated to 15 mmHg.  A 5 mm trocar was inserted in this position under optical guidance and then the abdomen was inspected.  There was no trauma to the underlying viscera with initial trocar placement.  Any abnormal findings, other than inflammation in the right upper quadrant, are listed above in the findings section.  Three additional trocars were placed, one 12 mm trocar in the subxiphoid position, one 5 mm trocar in the midline epigastric area and one 5mm trocar in the right upper quadrant subcostally.  These were placed under direct vision without any trauma to the underlying viscera.    The patient was then placed in head up, left side down positioning.  The gallbladder was identified and dissected free from its attachments to the omentum allowing the duodenum to fall away.  The infundibulum of the gallbladder was dissected free working laterally to medially.  The cystic duct and cystic artery were dissected free from surrounding connective tissue.  The infundibulum of the gallbladder was dissected off the cystic plate.  A critical view of safety was obtained with the cystic duct and cystic artery being cleared  of connective tissues and clearly the only two structures entering into the gallbladder with the liver clearly visible behind.  Clips were then applied to the cystic duct and cystic artery and then these structures were divided.  A PDS  Endoloop was placed on the cystic duct stump.  The gallbladder was dissected off the cystic plate, placed in an endocatch bag and removed from the 12 mm subxiphoid port site.  The clips were inspected and appeared effective.  The cystic plate was inspected and hemostasis was obtained using electrocautery.  A suction irrigator was used to clean the operative field.  Attention was turned to closure.  The 12 mm subxiphoid port site was closed using a 0-vicryl suture on a fascial suture passer.  The abdomen was desufflated.  The skin was closed using 4-0 monocryl and dermabond.  All sponge and needle counts were correct at the conclusion of the case.    Ivar Drape, MD General, Bariatric, & Minimally Invasive Surgery [facility], [place]

## 2023-08-13 NOTE — Anesthesia Procedure Notes (Signed)
Procedure Name: Intubation Date/Time: 08/13/2023 4:16 PM  Performed by: Tressia Miners, CRNAPre-anesthesia Checklist: Patient identified, Emergency Drugs available, Suction available, Patient being monitored and Timeout performed Patient Re-evaluated:Patient Re-evaluated prior to induction Oxygen Delivery Method: Circle system utilized Preoxygenation: Pre-oxygenation with 100% oxygen Induction Type: IV induction Ventilation: Mask ventilation without difficulty Laryngoscope Size: Mac and 4 Grade View: Grade I Tube type: Oral Tube size: 7.5 mm Number of attempts: 1 Airway Equipment and Method: Stylet Placement Confirmation: ETT inserted through vocal cords under direct vision, positive ETCO2 and breath sounds checked- equal and bilateral Secured at: 23 cm Tube secured with: Tape Dental Injury: Teeth and Oropharynx as per pre-operative assessment  Comments: Smooth IV Induction. Eyes taped. Easy mask. DL x 1 with grade 1 view. Atraumatically placed, teeth and lip remain intact as pre-op. Secured with tape. Bilateral breath sounds +/=, EtCO2 +, Adequate TV, VSS.

## 2023-08-13 NOTE — Progress Notes (Signed)
Patient ID: Albert Wade, male   DOB: Jun 01, 1946, 77 y.o.   [name]: [id number]    Progress Note   Subjective   Day # 2 CC; acute abdominal pain with nausea fever  Status post ERCP yesterday with stone extraction of a 2 mm stone. He feels about the same today, no increase in abdominal pain post ERCP He is currently waiting to go to the OR  WBC 5.3/hemoglobin 11/hematocrit 32.9 BNP 409 T. bili 1.4/alk phos 173/AST 38/ALT 74-ending down   Objective   Vital signs in last 24 hours: Temp:  [97.5 F (36.4 C)-98 F (36.7 C)] 98 F (36.7 C) (08/18 0749) Pulse Rate:  [50-59] 59 (08/18 0749) Resp:  [15] 15 (08/18 0749) BP: (146-153)/(63-71) 147/71 (08/18 0749) SpO2:  [96 %-100 %] 99 % (08/18 0749) Last BM Date : 08/11/23 General:    Elderly white male in NAD Heart:  Regular rate and rhythm; no murmurs Lungs: Respirations even and unlabored, lungs CTA bilaterally Abdomen:  Soft, nontender and nondistended. Normal bowel sounds. Extremities:  Without edema. Neurologic:  Alert and oriented,  grossly normal neurologically. Psych:  Cooperative. Normal mood and affect.  Intake/Output from previous day: 08/17 0701 - 08/18 0700 In: 600 [I.V.:600] Out: 0  Intake/Output this shift: No intake/output data recorded.  Lab Results: Recent Labs    08/11/23 1640 08/12/23 0644 08/13/23 0244  WBC 8.9 6.1 5.3  HGB 10.6* 10.2* 11.0*  HCT 33.0* 31.2* 32.9*  PLT 135* 123* 142*   BMET Recent Labs    08/10/23 2248 08/11/23 1640 08/12/23 0644 08/13/23 0244  NA 137  --  141 138  K 3.9  --  3.5 3.7  CL 104  --  108 104  CO2 24  --  24 24  GLUCOSE 125*  --  108* 123*  BUN 19  --  8 10  CREATININE 1.01 0.83 0.97 0.82  CALCIUM 7.8*  --  8.5* 8.8*   LFT Recent Labs    08/13/23 0244  PROT 5.8*  ALBUMIN 2.8*  AST 38  ALT 74*  ALKPHOS 173*  BILITOT 1.4*   PT/INR Recent Labs    08/10/23 2248 08/11/23 1640  LABPROT 14.6 15.2  INR 1.1 1.2    Studies/Results: DG  ERCP  Result Date: 08/13/2023 CLINICAL DATA:  Choledocholithiasis EXAM: ERCP TECHNIQUE: Multiple spot images obtained with the fluoroscopic device and submitted for interpretation post-procedure. FLUOROSCOPY: Radiation Exposure Index (as provided by the fluoroscopic device): 4.9 mGy Kerma COMPARISON:  MRI abdomen 08/11/2023 FINDINGS: Six intraoperative fluoroscopic images were provided for interpretation. The submitted images demonstrate cannulation and opacification of the common bile duct. No definite filling defects identified on the final image, however portions of the duct are obscured by endoscope. IMPRESSION: No definite filling defects identified within the common bile duct on final ERCP image. Portions of the duct are obscured by endoscope. These images were submitted for radiologic interpretation only. Please see the procedural report for the amount of contrast and the fluoroscopy time utilized. Electronically Signed   By: Acquanetta Belling M.D.   On: 08/13/2023 07:05   DG Chest Port 1 View  Result Date: 08/12/2023 CLINICAL DATA:  Shortness of breath. EXAM: PORTABLE CHEST 1 VIEW COMPARISON:  08/10/2023 FINDINGS: The lungs are clear without focal pneumonia, edema, pneumothorax or pleural effusion. The cardiopericardial silhouette is within normal limits for size. No acute bony abnormality. Telemetry leads overlie the chest. IMPRESSION: No active disease. Electronically Signed   By: Jamison Oka.D.  On: 08/12/2023 07:17       Assessment / Plan:    #1 acute abdominal pain and nausea with fever  Patient is status post ERCP yesterday with stone extraction of a 2 mm CBD stone.  He is stable postprocedure  #2 cholelithiasis/probable chronic cholecystitis-for lap chole later today  Blood cultures negative thus far Continues on Zosyn  #3 chronic back pain with chronic narcotic use-history of prolonged infection of the lumbar spine #4 coronary artery disease with remote angioplasty #5 history  of prostate cancer  Plan; Agree with plans for laparoscopic cholecystectomy GI will sign off, available if needed       Principal Problem:   Cholangitis Active Problems:   HLD (hyperlipidemia)   Coronary atherosclerosis   GERD (gastroesophageal reflux disease)   Chronic back pain   Benign prostatic hyperplasia   Chronic low back pain with sciatica   Prostate cancer (HCC)   Chronic diastolic CHF (congestive heart failure) (HCC)   COPD (chronic obstructive pulmonary disease) (HCC)   CAD (coronary artery disease)   HTN (hypertension)     LOS: 2 days   Aberdeen Hafen PA-C 08/13/2023, 1:36 PM

## 2023-08-13 NOTE — Progress Notes (Signed)
Progress Note: General Surgery Service   Chief Complaint/Subjective: Feeling well after ERCP  Objective: Vital signs in last 24 hours: Temp:  [97.5 F (36.4 C)-98 F (36.7 C)] 97.8 F (36.6 C) (08/18 1349) Pulse Rate:  [50-64] 64 (08/18 1432) Resp:  [14-15] 15 (08/18 1432) BP: (136-153)/(63-71) 136/64 (08/18 1349) SpO2:  [96 %-100 %] 99 % (08/18 1349) Last BM Date : 08/11/23  Intake/Output from previous day: 08/17 0701 - 08/18 0700 In: 600 [I.V.:600] Out: 0  Intake/Output this shift: No intake/output data recorded.  Constitutional: NAD; conversant; no deformities Eyes: Moist conjunctiva; no lid lag; anicteric; PERRL Neck: Trachea midline; no thyromegaly Lungs: Normal respiratory effort; no tactile fremitus CV: RRR; no palpable thrills; no pitting edema GI: Abd Soft, nontender; no palpable hepatosplenomegaly MSK: Normal range of motion of extremities; no clubbing/cyanosis Psychiatric: Appropriate affect; alert and oriented x3 Lymphatic: No palpable cervical or axillary lymphadenopathy  Lab Results: CBC  Recent Labs    08/12/23 0644 08/13/23 0244  WBC 6.1 5.3  HGB 10.2* 11.0*  HCT 31.2* 32.9*  PLT 123* 142*   BMET Recent Labs    08/12/23 0644 08/13/23 0244  NA 141 138  K 3.5 3.7  CL 108 104  CO2 24 24  GLUCOSE 108* 123*  BUN 8 10  CREATININE 0.97 0.82  CALCIUM 8.5* 8.8*   PT/INR Recent Labs    08/10/23 2248 08/11/23 1640  LABPROT 14.6 15.2  INR 1.1 1.2   ABG No results for input(s): "PHART", "HCO3" in the last 72 hours.  Invalid input(s): "PCO2", "PO2"  Anti-infectives: Anti-infectives (From admission, onward)    Start     Dose/Rate Route Frequency Ordered Stop   08/14/23 0600  ceFAZolin (ANCEF) IVPB 2g/100 mL premix        2 g 200 mL/hr over 30 Minutes Intravenous On call to O.R. 08/13/23 1348 08/15/23 0559   08/13/23 1417  ceFAZolin (ANCEF) 2-4 GM/100ML-% IVPB       Note to Pharmacy: Kathrene Bongo D: cabinet override      08/13/23  1417 08/14/23 0229   08/11/23 1900  [MAR Hold]  piperacillin-tazobactam (ZOSYN) IVPB 3.375 g        (MAR Hold since Sun 08/13/2023 at 1413.Hold Reason: Transfer to a Procedural area)   3.375 g 12.5 mL/hr over 240 Minutes Intravenous Every 8 hours 08/11/23 1716         Medications: Scheduled Meds:  [MAR Hold] amLODipine  10 mg Oral Daily   [MAR Hold] aspirin EC  81 mg Oral Daily   Chlorhexidine Gluconate Cloth  6 each Topical Once   [MAR Hold] citalopram  20 mg Oral Daily   [MAR Hold] diclofenac  100 mg Rectal Once   [MAR Hold] docusate sodium  100 mg Oral BID   [MAR Hold] heparin  5,000 Units Subcutaneous Q8H   [MAR Hold] pantoprazole (PROTONIX) IV  40 mg Intravenous Q12H   Continuous Infusions:  ceFAZolin     [START ON 08/14/2023]  ceFAZolin (ANCEF) IV     lactated ringers 10 mL/hr at 08/13/23 1433   [MAR Hold] piperacillin-tazobactam (ZOSYN)  IV 3.375 g (08/13/23 1226)   PRN Meds:.0.9 % irrigation (POUR BTL), [MAR Hold] albuterol, ceFAZolin, [MAR Hold] hydrALAZINE, [MAR Hold]  HYDROmorphone (DILAUDID) injection, [MAR Hold] naLOXone (NARCAN)  injection, [MAR Hold] ondansetron **OR** [MAR Hold] ondansetron (ZOFRAN) IV, [MAR Hold] oxycodone, sodium chloride irrigation, [MAR Hold] traZODone  Assessment/Plan: Albert Wade has gallstone pancreatitis s/p ERCP 8/17.  Feeling well this morning.  I recommend  laparoscopic cholecystectomy.  We discussed the procedure, its risks, benefits and alternatives and he granted consent to proceed.   LOS: 2 days   [name], MD  [facility] Use [email or web address] to contact on call provider

## 2023-08-13 NOTE — Anesthesia Postprocedure Evaluation (Signed)
Anesthesia Post Note  Patient: Albert Wade  Procedure(s) Performed: LAPAROSCOPIC CHOLECYSTECTOMY     Patient location during evaluation: PACU Anesthesia Type: General Level of consciousness: awake and alert Pain management: pain level controlled Vital Signs Assessment: post-procedure vital signs reviewed and stable Respiratory status: spontaneous breathing, nonlabored ventilation, respiratory function stable and patient connected to nasal cannula oxygen Cardiovascular status: blood pressure returned to baseline and stable Postop Assessment: no apparent nausea or vomiting Anesthetic complications: no   No notable events documented.  Last Vitals:  Vitals:   08/13/23 1811 08/13/23 2107  BP: 101/60 (!) 159/61  Pulse: 63 60  Resp: 15   Temp: 36.8 C 37.1 C  SpO2: 100% 99%    Last Pain:  Vitals:   08/13/23 2122  TempSrc:   PainSc: 10-Worst pain ever                 Mariann Barter

## 2023-08-13 NOTE — Discharge Instructions (Addendum)
 "  CHOLECYSTECTOMY POST OPERATIVE INSTRUCTIONS  Thinking Clearly  The anesthesia may cause you to feel different for 1 or 2 days. Do not drive, drink alcohol, or make any big decisions for at least 2 days.  Nutrition When you wake up, you will be able to drink small amounts of liquid. If you do not feel sick, you can slowly advance your diet to regular foods. Continue to drink lots of fluids, usually about 8 to 10 glasses per day. Eat a high-fiber diet so you dont strain during bowel movements. High-Fiber Foods Foods high in fiber include beans, bran cereals and whole-grain breads, peas, dried fruit (figs, apricots, and dates), raspberries, blackberries, strawberries, sweet corn, broccoli, baked potatoes with skin, plums, pears, apples, greens, and nuts. Activity Slowly increase your activity. Be sure to get up and walk every hour or so to prevent blood clots. No heavy lifting or strenuous activity for 4 weeks following surgery to prevent hernias at your incision sites It is normal to feel tired. You may need more sleep than usual.  Get your rest but make sure to get up and move around frequently to prevent blood clots and pneumonia.  Work and Return to [employer] can go back to work when you feel well enough. Discuss the timing with your surgeon. You can usually go back to school or work 1 week after an operation. If your work requires heavy lifting or strenuous activity you need to be placed on light duty for 4 weeks following surgery. You can return to gym class, sports or other physical activities 4 weeks after surgery.  Wound Care Always wash your hands before and after touching near your incision site. Do not soak in a bathtub until cleared at your follow up appointment. You may take a shower 24 hours after surgery. A small amount of drainage from the incision is normal. If the drainage is thick and yellow or the site is red, you may have an infection, so call your surgeon. If you  have a drain in one of your incisions, it will be taken out in office when the drainage stops. Steri-Strips will fall off in 7 to 10 days or they will be removed during your first office visit. If you have dermabond glue covering over the incision, allow the glue to flake off on its own. Avoid wearing tight or rough clothing. It may rub your incisions and make it harder for them to heal. Protect the new skin, especially from the sun. The sun can burn and cause darker scarring. Your scar will heal in about 4 to 6 weeks and will become softer and continue to fade over the next year.  The cosmetic appearance of the incisions will improve over the course of the first year after surgery. Sensation around your incision will return in a few weeks or months.  Bowel Movements After intestinal surgery, you may have loose watery stools for several days. If watery diarrhea lasts longer than 3 days, contact your surgeon. Pain medication (narcotics) can cause constipation. Increase the fiber in your diet with high-fiber foods if you are constipated. You can take an over the counter stool softener like Colace to avoid constipation.  Additional over the counter medications can also be used if Colace isn't sufficient (for example, Milk of Magnesia or Miralax ).  Pain The amount of pain is different for each person. Some people need only 1 to 3 doses of pain control medication, while others need more. Take alternating doses of  tylenol  and ibuprofen  around the clock for the first five days following surgery.  This will provide a baseline of pain control and help with inflammation.  Take the narcotic pain medication in addition if needed for severe pain.  Contact Your Surgeon at [phone], if you have: Pain in your right upper abdomen like a gallbladder attack. Pain that will not go away Pain that gets worse A fever of more than 101F (38.3C) Repeated vomiting Swelling, redness, bleeding, or bad-smelling  drainage from your wound site Strong abdominal pain No bowel movement or unable to pass gas for 3 days Watery diarrhea lasting longer than 3 days  Pain Control The goal of pain control is to minimize pain, keep you moving and help you heal. Your surgical team will work with you on your pain plan. Most often a combination of therapies and medications are used to control your pain. You may also be given medication (local anesthetic) at the surgical site. This may help control your pain for several days. Extreme pain puts extra stress on your body at a time when your body needs to focus on healing. Do not wait until your pain has reached a level 10 or is unbearable before telling your doctor or nurse. It is much easier to control pain before it becomes severe. Following a laparoscopic procedure, pain is sometimes felt in the shoulder. This is due to the gas inserted into your abdomen during the procedure. Moving and walking helps to decrease the gas and the right shoulder pain.  Use the guide below for ways to manage your post-operative pain. Learn more by going to facs.org/safepaincontrol.  How Intense Is My Pain Common Therapies to Feel Better       I hardly notice my pain, and it does not interfere with my activities.  I notice my pain and it distracts me, but I can still do activities (sitting up, walking, standing).  Non-Medication Therapies  Ice (in a bag, applied over clothing at the surgical site), elevation, rest, meditation, massage, distraction (music, TV, play) walking and mild exercise Splinting the abdomen with pillows +  Non-Opioid Medications Acetaminophen  (Tylenol ) Non-steroidal anti-inflammatory drugs (NSAIDS) Aspirin , Ibuprofen  (Motrin , Advil ) Naproxen (Aleve) Take these as needed, when you feel pain. Both acetaminophen  and NSAIDs help to decrease pain and swelling (inflammation).      My pain is hard to ignore and is more noticeable even when I rest.  My  pain interferes with my usual activities.  Non-Medication Therapies  +  Non-Opioid medications  Take on a regular schedule (around-the-clock) instead of as needed. (For example, Tylenol  every 6 hours at 9:00 am, 3:00 pm, 9:00 pm, 3:00 am and Motrin  every 6 hours at 12:00 am, 6:00 am, 12:00 pm, 6:00 pm)         I am focused on my pain, and I am not doing my daily activities.  I am groaning in pain, and I cannot sleep. I am unable to do anything.  My pain is as bad as it could be, and nothing else matters.  Non-Medication Therapies  +  Around-the-Clock Non-Opioid Medications  +  Short-acting opioids  Opioids should be used with other medications to manage severe pain. Opioids block pain and give a feeling of euphoria (feel high). Addiction, a serious side effect of opioids, is rare with short-term (a few days) use.  Examples of short-acting opioids include: Tramadol  (Ultram ), Hydrocodone  (Norco, Vicodin), Hydromorphone  (Dilaudid ), Oxycodone  (Oxycontin )     The above directions have been adapted  from the [employer] of [employer] Surgical Patient Education Program.  Please refer to the ACS website if needed: freakymates.de.ashx.   Deward Foy, MD [facility] [address], [place], [place]  [address] ?  [address] [address], [place], [place]   [address] [phone] ? [phone] ? FAX [phone] Web site: [email or web address]    After that resume your oral Keflex  antibiotic that you were taking before admission, same dose as you were taking before.  To be directed by your ID physician at [facility].   Follow with Primary MD Norleen Lynwood ORN, MD in 7 days   Get CBC, CMP, 2 view Chest X ray -  checked next visit with your primary MD    Activity: As tolerated with Full fall precautions use walker/cane & assistance as needed  Disposition Home    Diet: Heart Healthy     Special Instructions: If you have smoked or chewed Tobacco  in the last 2 yrs please stop smoking, stop any regular Alcohol  and or any Recreational drug use.  On your next visit with your primary care physician please Get Medicines reviewed and adjusted.  Please request your Prim.MD to go over all Hospital Tests and Procedure/Radiological results at the follow up, please get all Hospital records sent to your Prim MD by signing hospital release before you go home.  If you experience worsening of your admission symptoms, develop shortness of breath, life threatening emergency, suicidal or homicidal thoughts you must seek medical attention immediately by calling 911 or calling your MD immediately  if symptoms less severe.  You Must read complete instructions/literature along with all the possible adverse reactions/side effects for all the Medicines you take and that have been prescribed to you. Take any new Medicines after you have completely understood and accpet all the possible adverse reactions/side effects.   Do not drive when taking Pain medications.  Do not take more than prescribed Pain, Sleep and Anxiety Medications     "

## 2023-08-13 NOTE — Anesthesia Preprocedure Evaluation (Signed)
Anesthesia Evaluation  Patient identified by [name], date of birth, ID band Patient awake    Reviewed: Allergy & Precautions, NPO status , Patient's Chart, lab work & pertinent test results, reviewed documented beta blocker date and time   History of Anesthesia Complications Negative for: history of anesthetic complications  Airway Mallampati: I  TM Distance: >3 FB Neck ROM: Full    Dental  (+) Edentulous Upper, Edentulous Lower   Pulmonary sleep apnea , pneumonia, resolved, COPD, former smoker   breath sounds clear to auscultation       Cardiovascular hypertension, + angina  + CAD, + Past MI (remote) and +CHF  (-) Cardiac Stents and (-) CABG (-) Valvular Problems/Murmurs Rhythm:Regular Rate:Normal     Neuro/Psych neg Seizures PSYCHIATRIC DISORDERS Anxiety Depression       GI/Hepatic hiatal hernia,GERD  Controlled,,(+) neg Cirrhosis        Endo/Other    Renal/GU Renal disease     Musculoskeletal  (+) Arthritis ,    Abdominal   Peds  Hematology   Anesthesia Other Findings   Reproductive/Obstetrics                              Anesthesia Physical Anesthesia Plan  ASA: 2  Anesthesia Plan: General   Post-op Pain Management:    Induction: Intravenous  PONV Risk Score and Plan: 1 and Ondansetron  Airway Management Planned: Oral ETT  Additional Equipment:   Intra-op Plan:   Post-operative Plan:   Informed Consent: I have reviewed the patients History and Physical, chart, labs and discussed the procedure including the risks, benefits and alternatives for the proposed anesthesia with the patient or authorized representative who has indicated his/her understanding and acceptance.       Plan Discussed with: CRNA  Anesthesia Plan Comments:          Anesthesia Quick Evaluation

## 2023-08-13 NOTE — Progress Notes (Signed)
PROGRESS NOTE                                                                                                                                                                                                             Patient Demographics:    Albert Wade, is a 77 y.o. male, DOB - [birth year], EXB:[id number]  Outpatient Primary MD for the patient is Corwin Levins, MD    LOS - 2  Admit date - 08/11/2023    Chief complaint.  Abdominal pain, ongoing chronic back pain      Brief Narrative (HPI from H&P)   77 y.o. 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  He was seen by GI at [facility] and then transferred to [facility] for further management of choledocholithiasis, he was accepted by [facility] here.    Subjective:   Patient in bed appears to be in no distress denies any chest pain cough or shortness of breath, abdominal pain much improved, chronic back pain is still present, threatening to leave AMA, counseled   Assessment  & Plan :    1.  Abdominal pain due to choledocholithiasis.  Patient has had symptoms for 2 to 3 days with at least 1 to 2-day history of  fever - his LFTs are elevated, currently does not appear septic or toxic, WBC count stable, GI and general surgery on board underwent ERCP with successful CBD stone removal on 08/12/2023 by Dr. Rushie Chestnut, GI, general surgery contemplating cholecystectomy soon, unfortunately patient is getting restless and  threatening to leave AMA as he has things to do at home, counseled to stay.    2.  History of L-spine chronic infection.  On chronic Keflex for suppressive treatment for the last 1 year, post discharge follow-up with [facility] and neurosurgery, for now Zosyn.  History of MSSA infection in the past.   3.  Chronic diastolic CHF EF 60% on echocardiogram in 2022.  Clinically compensated.  As needed Lasix.   4.  Dyslipidemia.  Resume statin once LFTs have normalized.   5.  Hypertension.  Norvasc along with as needed IV hydralazine.   6.  GERD.  PPI.   7.  Chronic back pain.  Again confirmed on very high doses of oral narcotics, resume with caution with as needed Narcan on board.   8.  CT scan suggestive of mild right-sided hydronephrosis, possible passed stone per CT report.  Urine without any blood in it, likely chronic finding, monitor renal function with hydration.  Outpatient urology follow-up.   9.  Incidental finding of prominent hepatoduodenal ligament on MRCP.  Defer to GI.  PCP to monitor.-Follow-up with [facility] postdischarge.       Condition - Fair  Family Communication  :  wife bedside on 08/11/2023  Code Status : Full code  Consults  : [facility], general surgery  PUD Prophylaxis : PPI   Procedures  :     MRCP -  1. MRCP imaging is markedly motion degraded. Common bile duct in the head of the pancreas measures 7-8 mm diameter. Potential 2 mm stone in the distal common bile duct just proximal to the ampulla. 2. Several very tiny 1-2 mm stones in the dependent gallbladder lumen towards the neck. No gallbladder wall thickening or pericholecystic fluid. 3. Question mild peripancreatic edema in the head and body region. Correlation with lipase recommended. 4. Mild right hydroureteronephrosis. Etiology for this finding is not evident on the current abdominal MRI. 5. Mildly enlarged hepatoduodenal ligament and celiac axis lymph nodes, presumably reactive. Consider follow-up CT in 3  months to ensure stability.   ERCP.  Successful ERCP with CBD stone removal on 08/12/2023 by Dr. Rushie Chestnut, GI   Laparoscopic cholecystectomy scheduled for 08/13/2023 tentatively.       Disposition Plan  :    Status is: Observation  DVT Prophylaxis  :    heparin injection 5,000 Units Start: 08/12/23 1400    Lab Results  Component Value Date   PLT 142 (L) 08/13/2023    Diet :  Diet Order             Diet clear liquid Room service appropriate? Yes; Fluid consistency: Thin  Diet effective now                    Inpatient Medications  Scheduled Meds:  amLODipine  10 mg Oral Daily   aspirin EC  81 mg Oral Daily   citalopram  20 mg Oral Daily   diclofenac  100 mg Rectal Once   docusate sodium  100 mg Oral BID   heparin  5,000 Units Subcutaneous Q8H   pantoprazole (PROTONIX) IV  40 mg Intravenous Q12H   Continuous Infusions:  lactated ringers Stopped (08/12/23 1206)   piperacillin-tazobactam (ZOSYN)  IV 3.375 g (08/13/23 0524)   PRN Meds:.albuterol, hydrALAZINE, HYDROmorphone (DILAUDID) injection, naLOXone (NARCAN)  injection, ondansetron **OR** ondansetron (ZOFRAN) IV, oxycodone, traZODone  Antibiotics  :    Anti-infectives (From admission, onward)    Start     Dose/Rate Route Frequency Ordered Stop   08/11/23 1900  piperacillin-tazobactam (ZOSYN) IVPB 3.375 g        3.375 g 12.5 mL/hr over 240 Minutes Intravenous Every 8 hours 08/11/23 1716           Objective:   Vitals:   08/12/23 1709 08/12/23 2026 08/13/23 0527 08/13/23 4259  BP: (!) 146/69 (!) 152/68 (!) 153/63 (!) 147/71  Pulse: (!) 59 (!) 57 (!) 50 (!) 59  Resp: 15   15  Temp: 97.8 F (36.6 C) (!) 97.5 F (36.4 C) 97.6 F (36.4 C) 98 F (36.7 C)  TempSrc: Oral Oral Oral Oral  SpO2: 98% 96% 100% 99%  Weight:      Height:        Wt Readings from Last 3 Encounters:  08/11/23 82 kg  08/10/23 82.3 kg  07/05/23 76.2 kg     Intake/Output Summary (Last 24 hours) at 08/13/2023  0753 Last data filed at 08/12/2023 1143 Gross per 24 hour  Intake 600 ml  Output 0 ml  Net 600 ml     Physical Exam  Awake Alert, No new F.N deficits, Normal affect [place].AT,PERRAL Supple Neck, No JVD,   Symmetrical Chest wall movement, Good air movement bilaterally, CTAB RRR,No Gallops,Rubs or new Murmurs,  +ve B.Sounds, Abd Soft, No tenderness,   No Cyanosis, Clubbing or edema        Data Review:    Recent Labs  Lab 08/10/23 2248 08/11/23 1640 08/12/23 0644 08/13/23 0244  WBC 8.3 8.9 6.1 5.3  HGB 10.1* 10.6* 10.2* 11.0*  HCT 31.9* 33.0* 31.2* 32.9*  PLT 133* 135* 123* 142*  MCV 86.4 84.0 83.2 84.1  MCH 27.4 27.0 27.2 28.1  MCHC 31.7 32.1 32.7 33.4  RDW 15.8* 16.1* 16.0* 15.5  LYMPHSABS 0.3*  --  0.5* 0.6*  MONOABS 0.5  --  0.4 0.3  EOSABS 0.0  --  0.0 0.0  BASOSABS 0.0  --  0.0 0.0    Recent Labs  Lab 08/10/23 2248 08/11/23 0048 08/11/23 1640 08/12/23 0644 08/12/23 0853 08/13/23 0244  NA 137  --   --  141  --  138  K 3.9  --   --  3.5  --  3.7  CL 104  --   --  108  --  104  CO2 24  --   --  24  --  24  ANIONGAP 9  --   --  9  --  10  GLUCOSE 125*  --   --  108*  --  123*  BUN 19  --   --  8  --  10  CREATININE 1.01  --  0.83 0.97  --  0.82  AST 299*  --   --  74*  --  38  ALT 166*  --   --  94*  --  74*  ALKPHOS 203*  --   --  179*  --  173*  BILITOT 2.3*  --   --  2.9*  --  1.4*  ALBUMIN 3.1*  --   --  2.8*  --  2.8*  CRP  --   --   --   --  13.3* 10.5*  PROCALCITON  --   --   --   --  3.37 2.68  LATICACIDVEN  --  1.3  --   --   --   --   INR 1.1  --  1.2  --   --   --   BNP  --   --   --  596.3*  --  409.9*  MG  --   --   --  1.9  --  2.0  CALCIUM 7.8*  --   --  8.5*  --  8.8*      Recent Labs  Lab 08/10/23 2248  08/11/23 0048 08/11/23 1640 08/12/23 0644 08/12/23 0853 08/13/23 0244  CRP  --   --   --   --  13.3* 10.5*  PROCALCITON  --   --   --   --  3.37 2.68  LATICACIDVEN  --  1.3  --   --   --   --   INR 1.1  --  1.2  --   --    --   BNP  --   --   --  596.3*  --  409.9*  MG  --   --   --  1.9  --  2.0  CALCIUM 7.8*  --   --  8.5*  --  8.8*      Micro Results Recent Results (from the past 240 hour(s))  SARS Coronavirus 2 by RT PCR (hospital order, performed in [facility] hospital lab) *cepheid single result test* Anterior Nasal Swab     Status: None   Collection Time: 08/10/23 10:49 PM   Specimen: Anterior Nasal Swab  Result Value Ref Range Status   SARS Coronavirus 2 by RT PCR NEGATIVE NEGATIVE Final    Comment: (NOTE) SARS-CoV-2 target nucleic acids are NOT DETECTED.  The SARS-CoV-2 RNA is generally detectable in upper and lower respiratory specimens during the acute phase of infection. The lowest concentration of SARS-CoV-2 viral copies this assay can detect is 250 copies / mL. A negative result does not preclude SARS-CoV-2 infection and should not be used as the sole basis for treatment or other patient management decisions.  A negative result may occur with improper specimen collection / handling, submission of specimen other than nasopharyngeal swab, presence of viral mutation(s) within the areas targeted by this assay, and inadequate number of viral copies (<250 copies / mL). A negative result must be combined with clinical observations, patient history, and epidemiological information.  Fact Sheet for Patients:   RoadLapTop.co.za  Fact Sheet for Healthcare Providers: http://kim-miller.com/  This test is not yet approved or  cleared by the [place] FDA and has been authorized for detection and/or diagnosis of SARS-CoV-2 by FDA under an Emergency Use Authorization (EUA).  This EUA will remain in effect (meaning this test can be used) for the duration of the COVID-19 declaration under Section 564(b)(1) of the Act, 21 U.S.C. section 360bbb-3(b)(1), unless the authorization is terminated or revoked sooner.  Performed at [facility],  [address] [place], [place] [address]   Blood Culture (routine x 2)     Status: None (Preliminary result)   Collection Time: 08/10/23 11:36 PM   Specimen: BLOOD  Result Value Ref Range Status   Specimen Description BLOOD RIGHT ARM  Final   Special Requests   Final    BOTTLES DRAWN AEROBIC AND ANAEROBIC Blood Culture adequate volume   Culture   Final    NO GROWTH 3 DAYS Performed at [facility], [address] [place], [place] [address]    Report Status PENDING  Incomplete  Blood Culture (routine x 2)     Status: None (Preliminary result)   Collection Time: 08/10/23 11:39 PM   Specimen: BLOOD  Result Value Ref Range Status   Specimen Description BLOOD LEFT ARM  Final   Special Requests   Final    BOTTLES DRAWN AEROBIC AND ANAEROBIC Blood Culture adequate volume   Culture   Final    NO GROWTH 3 DAYS Performed at [facility], [address], [place], [place] [address]  Report Status PENDING  Incomplete  MRSA Next Gen by PCR, Nasal     Status: None   Collection Time: 08/11/23  3:56 PM   Specimen: Nasal Mucosa; Nasal Swab  Result Value Ref Range Status   MRSA by PCR Next Gen NOT DETECTED NOT DETECTED Final    Comment: (NOTE) The GeneXpert MRSA Assay (FDA approved for NASAL specimens only), is one component of a comprehensive MRSA colonization surveillance program. It is not intended to diagnose MRSA infection nor to guide or monitor treatment for MRSA infections. Test performance is not FDA approved in patients less than 103 years old. Performed at [facility], [address], [place], [place] [address]     Radiology Reports DG ERCP  Result Date: 08/13/2023 CLINICAL DATA:  Choledocholithiasis EXAM: ERCP TECHNIQUE: Multiple spot images obtained with the fluoroscopic device and submitted for interpretation post-procedure. FLUOROSCOPY: Radiation Exposure Index (as provided by the fluoroscopic device): 4.9 mGy Kerma COMPARISON:  MRI abdomen  08/11/2023 FINDINGS: Six intraoperative fluoroscopic images were provided for interpretation. The submitted images demonstrate cannulation and opacification of the common bile duct. No definite filling defects identified on the final image, however portions of the duct are obscured by endoscope. IMPRESSION: No definite filling defects identified within the common bile duct on final ERCP image. Portions of the duct are obscured by endoscope. These images were submitted for radiologic interpretation only. Please see the procedural report for the amount of contrast and the fluoroscopy time utilized. Electronically Signed   By: Acquanetta Belling M.D.   On: 08/13/2023 07:05   DG Chest Port 1 View  Result Date: 08/12/2023 CLINICAL DATA:  Shortness of breath. EXAM: PORTABLE CHEST 1 VIEW COMPARISON:  08/10/2023 FINDINGS: The lungs are clear without focal pneumonia, edema, pneumothorax or pleural effusion. The cardiopericardial silhouette is within normal limits for size. No acute bony abnormality. Telemetry leads overlie the chest. IMPRESSION: No active disease. Electronically Signed   By: Kennith Center M.D.   On: 08/12/2023 07:17   MR ABDOMEN MRCP W WO CONTAST  Result Date: 08/11/2023 CLINICAL DATA:  Biliary dilatation with elevated LFTs. EXAM: MRI ABDOMEN WITHOUT AND WITH CONTRAST (INCLUDING MRCP) TECHNIQUE: Multiplanar multisequence MR imaging of the abdomen was performed both before and after the administration of intravenous contrast. Heavily T2-weighted images of the biliary and pancreatic ducts were obtained, and three-dimensional MRCP images were rendered by post processing. CONTRAST:  8mL GADAVIST GADOBUTROL 1 MMOL/ML IV SOLN COMPARISON:  CT scan from earlier same day FINDINGS: Lower chest: Unremarkable. Hepatobiliary: No suspicious focal abnormality within the liver parenchyma. No gallbladder wall thickening or pericholecystic fluid. Several very tiny 1-2 mm stones are seen in the dependent gallbladder lumen  towards the neck (see axial T2 image 14 of series 9. MRCP imaging is markedly motion degraded. Common duct measures 9 mm diameter. Common bile duct in the head of the pancreas is 7-8 mm diameter. Potential 2 mm stone in the distal common bile duct just proximal to the ampulla (see axial T2 image 19 of series 9). Pancreas: No main duct dilatation. No focal mass lesion. Axial T2 haste imaging raises the question of some mild peripancreatic edema in the head and body region. Spleen:  No splenomegaly. No suspicious focal mass lesion. Adrenals/Urinary Tract: No adrenal nodule or mass. No suspicious enhancing renal mass. Mild right hydroureteronephrosis. Stomach/Bowel: Stomach is moderately distended with food and fluid. Duodenum is normally positioned as is the ligament of Treitz. No small bowel or colonic dilatation within the visualized  abdomen. Vascular/Lymphatic: No abdominal aortic aneurysm. 14 mm short axis hepatoduodenal ligament lymph node seen on 19/4, presumably reactive. 14 mm short axis celiac axis node seen on 16/4. Other:  No intraperitoneal free fluid. Musculoskeletal: No focal suspicious marrow enhancement within the visualized bony anatomy. IMPRESSION: 1. MRCP imaging is markedly motion degraded. Common bile duct in the head of the pancreas measures 7-8 mm diameter. Potential 2 mm stone in the distal common bile duct just proximal to the ampulla. 2. Several very tiny 1-2 mm stones in the dependent gallbladder lumen towards the neck. No gallbladder wall thickening or pericholecystic fluid. 3. Question mild peripancreatic edema in the head and body region. Correlation with lipase recommended. 4. Mild right hydroureteronephrosis. Etiology for this finding is not evident on the current abdominal MRI. 5. Mildly enlarged hepatoduodenal ligament and celiac axis lymph nodes, presumably reactive. Consider follow-up CT in 3 months to ensure stability. Electronically Signed   By: Kennith Center M.D.   On: 08/11/2023  06:29   MR 3D Recon At Scanner  Result Date: 08/11/2023 CLINICAL DATA:  Biliary dilatation with elevated LFTs. EXAM: MRI ABDOMEN WITHOUT AND WITH CONTRAST (INCLUDING MRCP) TECHNIQUE: Multiplanar multisequence MR imaging of the abdomen was performed both before and after the administration of intravenous contrast. Heavily T2-weighted images of the biliary and pancreatic ducts were obtained, and three-dimensional MRCP images were rendered by post processing. CONTRAST:  8mL GADAVIST GADOBUTROL 1 MMOL/ML IV SOLN COMPARISON:  CT scan from earlier same day FINDINGS: Lower chest: Unremarkable. Hepatobiliary: No suspicious focal abnormality within the liver parenchyma. No gallbladder wall thickening or pericholecystic fluid. Several very tiny 1-2 mm stones are seen in the dependent gallbladder lumen towards the neck (see axial T2 image 14 of series 9. MRCP imaging is markedly motion degraded. Common duct measures 9 mm diameter. Common bile duct in the head of the pancreas is 7-8 mm diameter. Potential 2 mm stone in the distal common bile duct just proximal to the ampulla (see axial T2 image 19 of series 9). Pancreas: No main duct dilatation. No focal mass lesion. Axial T2 haste imaging raises the question of some mild peripancreatic edema in the head and body region. Spleen:  No splenomegaly. No suspicious focal mass lesion. Adrenals/Urinary Tract: No adrenal nodule or mass. No suspicious enhancing renal mass. Mild right hydroureteronephrosis. Stomach/Bowel: Stomach is moderately distended with food and fluid. Duodenum is normally positioned as is the ligament of Treitz. No small bowel or colonic dilatation within the visualized abdomen. Vascular/Lymphatic: No abdominal aortic aneurysm. 14 mm short axis hepatoduodenal ligament lymph node seen on 19/4, presumably reactive. 14 mm short axis celiac axis node seen on 16/4. Other:  No intraperitoneal free fluid. Musculoskeletal: No focal suspicious marrow enhancement within  the visualized bony anatomy. IMPRESSION: 1. MRCP imaging is markedly motion degraded. Common bile duct in the head of the pancreas measures 7-8 mm diameter. Potential 2 mm stone in the distal common bile duct just proximal to the ampulla. 2. Several very tiny 1-2 mm stones in the dependent gallbladder lumen towards the neck. No gallbladder wall thickening or pericholecystic fluid. 3. Question mild peripancreatic edema in the head and body region. Correlation with lipase recommended. 4. Mild right hydroureteronephrosis. Etiology for this finding is not evident on the current abdominal MRI. 5. Mildly enlarged hepatoduodenal ligament and celiac axis lymph nodes, presumably reactive. Consider follow-up CT in 3 months to ensure stability. Electronically Signed   By: Kennith Center M.D.   On: 08/11/2023 06:29   MR  Lumbar Spine W Wo Contrast  Result Date: 08/11/2023 CLINICAL DATA:  MSSA seeding of hardware. EXAM: MRI LUMBAR SPINE WITHOUT AND WITH CONTRAST TECHNIQUE: Multiplanar and multiecho pulse sequences of the lumbar spine were obtained without and with intravenous contrast. CONTRAST:  8mL GADAVIST GADOBUTROL 1 MMOL/ML IV SOLN COMPARISON:  05/05/2023 FINDINGS: Segmentation:  Standard. Alignment:  Minimal dextrocurvature Vertebrae: L4-5 and L5-S1 fusion with solid intervertebral arthrodesis. Minor marrow edema in the endplates at L3-4, likely degenerative in the absence of paravertebral edema or disc T2 hyperintensity and in the presence of vacuum phenomenon by prior CT. Postcontrast assessment is limited by metal artifact. Conus medullaris and cauda equina: Conus extends to the L2 level. Conus and cauda equina appear normal. Paraspinal and other soft tissues: Full urinary bladder and right hydronephrosis. The right ureter appears reimplanted and this may be from stricture or reflux. There is contemporaneous abdominal CT. Disc levels: T12- L1: Ventral spondylitic spurring. L1-L2: Disc narrowing and bulging with  biforaminal protrusion. Bilateral facet spurring. Moderate left and mild-to-moderate right foraminal narrowing. L2-L3: Circumferential disc bulging. Degenerative facet spurring on both sides. Mild bilateral foraminal narrowing. L3-L4: Disc degeneration with endplate ridging. Facet spurring asymmetric to the left. Moderate left more than right foraminal narrowing. Widely patent spinal canal after laminectomy. L4-L5: Fusion without impingement L5-S1:Fusion without impingement IMPRESSION: 1. No acute finding.  No clear sign of spinal infection. 2. Degenerated and postoperative lumbar spine with up to moderate foraminal narrowing at L1-2 and L3-4. Diffusely patent spinal canal. Electronically Signed   By: Tiburcio Pea M.D.   On: 08/11/2023 04:39   US ABDOMEN LIMITED RUQ (LIVER/GB)  Result Date: 08/11/2023 CLINICAL DATA:  Elevated LFTs EXAM: ULTRASOUND ABDOMEN LIMITED RIGHT UPPER QUADRANT COMPARISON:  CT from earlier in the same day. FINDINGS: Gallbladder: Gallbladder is well distended. No wall thickening or pericholecystic fluid is noted. Small dependent gallstones are seen. Negative sonographic Murphy's sign is elicited. Common bile duct: Diameter: 12 mm tapering to 7 mm. No intrahepatic ductal dilatation is seen. Liver: No focal lesion identified. Within normal limits in parenchymal echogenicity. Portal vein is patent on color Doppler imaging with normal direction of blood flow towards the liver. Other: None. IMPRESSION: Prominent common bile duct. Distal common bile duct stone could not be totally excluded. Cholelithiasis without complicating factors. Electronically Signed   By: Alcide Clever M.D.   On: 08/11/2023 02:53   CT ABDOMEN PELVIS W CONTRAST  Result Date: 08/11/2023 CLINICAL DATA:  Abdominal pain, acute, nonlocalized fever, abd pain EXAM: CT ABDOMEN AND PELVIS WITH CONTRAST TECHNIQUE: Multidetector CT imaging of the abdomen and pelvis was performed using the standard protocol following bolus  administration of intravenous contrast. RADIATION DOSE REDUCTION: This exam was performed according to the departmental dose-optimization program which includes automated exposure control, adjustment of the mA and/or kV according to patient size and/or use of iterative reconstruction technique. CONTRAST:  OMNIPAQUE IOHEXOL 300 MG/ML  SOLN COMPARISON:  CT lumbar spine 02/23/2023, CT abdomen pelvis 10/14/2021 FINDINGS: Lower chest: No acute abnormality. Hepatobiliary: No focal liver abnormality. No gallstones, gallbladder wall thickening, or pericholecystic fluid. No biliary dilatation. Pancreas: Diffusely atrophic. No focal lesion. Otherwise normal pancreatic contour. No surrounding inflammatory changes. No main pancreatic ductal dilatation. Spleen: Splenule noted normal in size without focal abnormality. Adrenals/Urinary Tract: No adrenal nodule bilaterally. Bilateral kidneys enhance symmetrically. Mild right hydroureteronephrosis with abrupt change in caliber at the distal ureter (2:55). No right nephroureterolithiasis. No right hydroureteronephrosis. The urinary bladder is unremarkable. On delayed imaging, there is no  urothelial wall thickening and there are no filling defects in the opacified portions of the bilateral collecting systems or ureters. Stomach/Bowel: Stomach is within normal limits. No evidence of bowel wall thickening or dilatation. The appendix is not definitely identified with no inflammatory changes in the right lower quadrant to suggest acute appendicitis. Vascular/Lymphatic: No abdominal aorta or iliac aneurysm. Moderate to severe atherosclerotic plaque of the aorta and its branches. Prominent but nonenlarged mesenteric and retroperitoneal lymphadenopathy with vague small bowel mesenteric haziness. No abdominal, pelvic, or inguinal lymphadenopathy. Reproductive: Prostate is unremarkable. Other: No intraperitoneal free fluid. No intraperitoneal free gas. No organized fluid collection.  Musculoskeletal: No abdominal wall hernia or abnormality. Diffusely decreased bone density. No suspicious lytic or blastic osseous lesions. No acute displaced fracture. L4-L5 and L5-S1 interbody surgical hardware. IMPRESSION: 1. Mild right hydroureteronephrosis with abrupt change in caliber at the distal ureter. No nephroureterolithiasis. This may reflect the residual of a recently passed calculus or reflect chronic changes of obstructive uropathy or reflux. 2. Diffusely decreased bone density. 3. Nonspecific small bowel misty mesentery. Electronically Signed   By: Tish Frederickson M.D.   On: 08/11/2023 01:40   DG Chest Port 1 View  Result Date: 08/10/2023 CLINICAL DATA:  Fever history of liver and kidney disease EXAM: PORTABLE CHEST 1 VIEW COMPARISON:  08/23/2022 FINDINGS: Borderline to mild cardiomegaly. No acute airspace disease or effusion. No pneumothorax. IMPRESSION: No active disease. Borderline to mild cardiomegaly. Electronically Signed   By: Jasmine Pang M.D.   On: 08/10/2023 23:27      Signature  -   Susa Raring M.D on 08/13/2023 at 7:53 AM   -  To page go to [email or web address]

## 2023-08-13 NOTE — Progress Notes (Signed)
PT Cancellation Note  Patient Details Name: Albert Wade [name]: [id number] DOB: 11/26/1946   Cancelled Treatment:    Reason Eval/Treat Not Completed: Patient at procedure or test/unavailable. Following to check in with pt after procedure this afternoon but pt remains off unit at procedure, will follow up tomorrow.   Vickki Muff, PT, DPT   Acute Rehabilitation Department Office [phone] Secure Chat Communication Preferred   Ronnie Derby 08/13/2023, 5:25 PM

## 2023-08-14 ENCOUNTER — Encounter (HOSPITAL_COMMUNITY): Payer: Self-pay | Admitting: Surgery

## 2023-08-14 ENCOUNTER — Other Ambulatory Visit: Payer: Self-pay | Admitting: Internal Medicine

## 2023-08-14 ENCOUNTER — Other Ambulatory Visit (HOSPITAL_COMMUNITY): Payer: Self-pay

## 2023-08-14 DIAGNOSIS — K8309 Other cholangitis: Secondary | ICD-10-CM | POA: Diagnosis not present

## 2023-08-14 DIAGNOSIS — N1330 Unspecified hydronephrosis: Secondary | ICD-10-CM

## 2023-08-14 DIAGNOSIS — R933 Abnormal findings on diagnostic imaging of other parts of digestive tract: Secondary | ICD-10-CM

## 2023-08-14 LAB — CBC WITH DIFFERENTIAL/PLATELET
Abs Immature Granulocytes: 0.03 10*3/uL (ref 0.00–0.07)
Basophils Absolute: 0 10*3/uL (ref 0.0–0.1)
Basophils Relative: 0 %
Eosinophils Absolute: 0 10*3/uL (ref 0.0–0.5)
Eosinophils Relative: 0 %
HCT: 32.6 % — ABNORMAL LOW (ref 39.0–52.0)
Hemoglobin: 10.8 g/dL — ABNORMAL LOW (ref 13.0–17.0)
Immature Granulocytes: 1 %
Lymphocytes Relative: 10 %
Lymphs Abs: 0.5 10*3/uL — ABNORMAL LOW (ref 0.7–4.0)
MCH: 27.8 pg (ref 26.0–34.0)
MCHC: 33.1 g/dL (ref 30.0–36.0)
MCV: 83.8 fL (ref 80.0–100.0)
Monocytes Absolute: 0.2 10*3/uL (ref 0.1–1.0)
Monocytes Relative: 4 %
Neutro Abs: 4.8 10*3/uL (ref 1.7–7.7)
Neutrophils Relative %: 85 %
Platelets: 167 10*3/uL (ref 150–400)
RBC: 3.89 MIL/uL — ABNORMAL LOW (ref 4.22–5.81)
RDW: 15.8 % — ABNORMAL HIGH (ref 11.5–15.5)
WBC: 5.6 10*3/uL (ref 4.0–10.5)
nRBC: 0 % (ref 0.0–0.2)

## 2023-08-14 LAB — COMPREHENSIVE METABOLIC PANEL WITH GFR
ALT: 56 U/L — ABNORMAL HIGH (ref 0–44)
AST: 32 U/L (ref 15–41)
Albumin: 3 g/dL — ABNORMAL LOW (ref 3.5–5.0)
Alkaline Phosphatase: 145 U/L — ABNORMAL HIGH (ref 38–126)
Anion gap: 9 (ref 5–15)
BUN: 16 mg/dL (ref 8–23)
CO2: 27 mmol/L (ref 22–32)
Calcium: 8.6 mg/dL — ABNORMAL LOW (ref 8.9–10.3)
Chloride: 104 mmol/L (ref 98–111)
Creatinine, Ser: 0.86 mg/dL (ref 0.61–1.24)
GFR, Estimated: >60 mL/min
Glucose, Bld: 117 mg/dL — ABNORMAL HIGH (ref 70–99)
Potassium: 3.6 mmol/L (ref 3.5–5.1)
Sodium: 140 mmol/L (ref 135–145)
Total Bilirubin: 1.2 mg/dL (ref 0.3–1.2)
Total Protein: 6.3 g/dL — ABNORMAL LOW (ref 6.5–8.1)

## 2023-08-14 LAB — PROCALCITONIN: Procalcitonin: 1.35 ng/mL

## 2023-08-14 LAB — C-REACTIVE PROTEIN: CRP: 4.8 mg/dL — ABNORMAL HIGH

## 2023-08-14 LAB — MAGNESIUM: Magnesium: 2 mg/dL (ref 1.7–2.4)

## 2023-08-14 LAB — BRAIN NATRIURETIC PEPTIDE: B Natriuretic Peptide: 271.8 pg/mL — ABNORMAL HIGH (ref 0.0–100.0)

## 2023-08-14 MED ORDER — AMOXICILLIN-POT CLAVULANATE 875-125 MG PO TABS
1.0000 | ORAL_TABLET | Freq: Two times a day (BID) | ORAL | 0 refills | Status: DC
  Filled 2023-08-14: qty 10, 5d supply, fill #0

## 2023-08-14 MED ORDER — OXYCODONE HCL 5 MG PO TABS: 5.0000 mg | ORAL_TABLET | Freq: Four times a day (QID) | ORAL | 0 refills | Status: DC | PRN

## 2023-08-14 MED ORDER — CEPHALEXIN 500 MG PO CAPS: 3000.0000 mg | ORAL_CAPSULE | Freq: Every day | ORAL | Status: DC

## 2023-08-14 NOTE — Discharge Summary (Signed)
Albert Wade:[id number] DOB: [birth year] DOA: 08/11/2023  PCP: Albert Levins, MD  Admit date: 08/11/2023  Discharge date: 08/14/2023  Admitted From: Home   Disposition:  Home   Recommendations for Outpatient Follow-up:   Follow up with PCP in 1-2 weeks  PCP Please obtain BMP/CBC, 2 view CXR in 1week,  (see Discharge instructions)   PCP Please follow up on the following pending results: Kindly review CT scan findings arrange for outpatient urology and GI follow-up.   Home Health: None   Equipment/Devices: None  Consultations: None GI, CCS Discharge Condition: Stable    CODE STATUS: Full    Diet Recommendation: Heart Healthy   CC - Abd pain  Brief history of present illness from the day of admission and additional interim summary    77 y.o. 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  He was seen by GI at [facility] and then transferred to [facility] for further management of choledocholithiasis, he was accepted by [facility] here.                                                                  Hospital Course   1.  Abdominal pain due to choledocholithiasis.  Patient has had symptoms for 2 to 3 days with at least 1 to 2-day history of  fever - his LFTs are elevated, currently does not appear septic or toxic, WBC count stable, GI and general  surgery on board underwent ERCP with successful CBD stone removal on 08/12/2023 by Dr. Rushie Chestnut, seen by general surgery underwent laparoscopic cholecystectomy without any complications on 08/13/2023, stable and symptom-free now eager to go home will be discharged home on 5 more days of oral Augmentin thereafter resume his Keflex at home dose unchanged which she takes chronically for his L-spine infection.    2.  History of L-spine chronic infection.  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   3.  Chronic diastolic CHF EF 60% on echocardiogram in 2022.  Clinically compensated.  As needed Lasix.   4.  Dyslipidemia.  Resume statin once LFTs have  normalized.   5.  Hypertension.  Norvasc along with as needed IV hydralazine.   6.  GERD.  PPI.   7.  Chronic back pain.  Again confirmed on very high doses of oral narcotics, request PCP to gradually titrate down narcotic doses.   8.  CT scan suggestive of mild right-sided hydronephrosis, possible passed stone per CT report.  Urine without any blood in it, likely chronic finding, monitor renal function with hydration.  Outpatient urology follow-up, to be arranged by PCP.   9.  Incidental finding of prominent hepatoduodenal ligament on MRCP.  Defer to GI.  PCP to monitor.-Follow-up with [facility] postdischarge, to be arranged by PCP.    Discharge diagnosis     Principal Problem:   Cholangitis Active Problems:   Chronic diastolic CHF (congestive heart failure) (HCC)   COPD (chronic obstructive pulmonary disease) (HCC)   Chronic back pain   HTN (hypertension)   Coronary atherosclerosis   CAD (coronary artery disease)   HLD (hyperlipidemia)   Benign prostatic hyperplasia   GERD (gastroesophageal  reflux disease)   Chronic low back pain with sciatica   Prostate cancer (HCC)   Choledocholithiasis    Discharge instructions    Discharge Instructions     Diet - low sodium heart healthy   Complete by: As directed    Discharge instructions   Complete by: As directed    After that resume your oral Keflex antibiotic that you were taking before admission, same dose as you were taking before.  To be directed by your ID physician at [facility].  Follow with Primary MD Albert Levins, MD in 7 days   Get CBC, CMP, 2 view Chest X ray -  checked next visit with your primary MD    Activity: As tolerated with Full fall precautions use walker/cane & assistance as needed  Disposition Home    Diet: Heart Healthy    Special Instructions: If you have smoked or chewed Tobacco  in the last 2 yrs please stop smoking, stop any regular Alcohol  and or any Recreational drug use.  On your next visit with your primary care physician please Get Medicines reviewed and adjusted.  Please request your Prim.MD to go over all Hospital Tests and Procedure/Radiological results at the follow up, please get all Hospital records sent to your Prim MD by signing hospital release before you go home.  If you experience worsening of your admission symptoms, develop shortness of breath, life threatening emergency, suicidal or homicidal thoughts you must seek medical attention immediately by calling 911 or calling your MD immediately  if symptoms less severe.  You Must read complete instructions/literature along with all the possible adverse reactions/side effects for all the Medicines you take and that have been prescribed to you. Take any new Medicines after you have completely understood and accpet all the possible adverse reactions/side effects.   Do not drive when taking Pain medications.  Do not take more than prescribed Pain, Sleep and Anxiety Medications   Increase activity slowly   Complete by: As directed         Discharge Medications   Allergies as of 08/14/2023       Reactions   Cyclobenzaprine    agitation    Seroquel [quetiapine] Hives        Medication List     TAKE these medications    albuterol 108 (90 Base) MCG/ACT inhaler Commonly known as: VENTOLIN HFA INHALE 2 PUFFS INTO THE LUNGS 4 TIMES DAILY  AS NEEDED What changed:  how much to take how to take this when to take this reasons to take this additional instructions   amLODipine 2.5 MG tablet Commonly known as: NORVASC TAKE 1 TABLET BY MOUTH DAILY   amoxicillin-clavulanate 875-125 MG tablet Commonly known as: AUGMENTIN Take 1 tablet by mouth 2 (two) times daily.   aspirin 81 MG tablet Take 81 mg by mouth daily.   blood glucose meter kit and supplies Kit Dispense based on patient and insurance preference. Use up to four times daily as directed.   cephALEXin 500 MG capsule Commonly known as: KEFLEX Take 6 capsules (3,000 mg total) by mouth daily. Resume as you were taking this before unchanged dose once you have finished Augmentin course in 5 days. Start taking on: August 19, 2023 What changed:  additional instructions These instructions start on August 19, 2023. If you are unsure what to do until then, ask your doctor or other care provider.   citalopram 20 MG tablet Commonly known as: CELEXA Take 1 tablet (20 mg total) by mouth daily.   furosemide 40 MG tablet Commonly known as: LASIX 1 tab by mouth once per day as needed   omeprazole 20 MG capsule Commonly known as: PRILOSEC TAKE ONE CAPSULE BY MOUTH TWICE A DAY What changed: when to take this   oxycodone 30 MG immediate release tablet Commonly known as: ROXICODONE 1 tab by mouth 5 times per day What changed:  how much to take how to take this when to take this additional instructions   potassium chloride 10 MEQ tablet Commonly known as: Klor-Con 10 1 tab by mouth once daily when taking lasix What changed:  how much to take how to take  this when to take this additional instructions   simvastatin 40 MG tablet Commonly known as: ZOCOR TAKE 1 TABLET BY MOUTH AT BEDTIME   traZODone 50 MG tablet Commonly known as: DESYREL Take 1 tablet (50 mg total) by mouth at bedtime as needed for sleep.         Follow-up Information     Stechschulte, Hyman Hopes, MD Follow up in 4 week(s).   Specialty: Surgery Contact information: [address] [place] [address] [phone]         Albert Levins, MD. Schedule an appointment as soon as possible for a visit in 1 week(s).   Specialties: Internal Medicine, Radiology Contact information: [address] [place] [address] [phone]                 Major procedures and Radiology Reports - PLEASE review detailed and final reports thoroughly  -     DG ERCP  Result Date: 08/13/2023 CLINICAL DATA:  Choledocholithiasis EXAM: ERCP TECHNIQUE: Multiple spot images obtained with the fluoroscopic device and submitted for interpretation post-procedure. FLUOROSCOPY: Radiation Exposure Index (as provided by the fluoroscopic device): 4.9 mGy Kerma COMPARISON:  MRI abdomen 08/11/2023 FINDINGS: Six intraoperative fluoroscopic images were provided for interpretation. The submitted images demonstrate cannulation and opacification of the common bile duct. No definite filling defects identified on the final image, however portions of the duct are obscured by endoscope. IMPRESSION: No definite filling defects identified within the common bile duct on final ERCP image. Portions of the duct are obscured by endoscope. These images were submitted for radiologic interpretation only. Please see the procedural report for the amount of contrast and the fluoroscopy time utilized. Electronically Signed   By: Acquanetta Belling M.D.   On:  08/13/2023 07:05   DG Chest Port 1 View  Result Date: 08/12/2023 CLINICAL DATA:  Shortness of breath. EXAM: PORTABLE CHEST 1 VIEW COMPARISON:   08/10/2023 FINDINGS: The lungs are clear without focal pneumonia, edema, pneumothorax or pleural effusion. The cardiopericardial silhouette is within normal limits for size. No acute bony abnormality. Telemetry leads overlie the chest. IMPRESSION: No active disease. Electronically Signed   By: Kennith Center M.D.   On: 08/12/2023 07:17   MR ABDOMEN MRCP W WO CONTAST  Result Date: 08/11/2023 CLINICAL DATA:  Biliary dilatation with elevated LFTs. EXAM: MRI ABDOMEN WITHOUT AND WITH CONTRAST (INCLUDING MRCP) TECHNIQUE: Multiplanar multisequence MR imaging of the abdomen was performed both before and after the administration of intravenous contrast. Heavily T2-weighted images of the biliary and pancreatic ducts were obtained, and three-dimensional MRCP images were rendered by post processing. CONTRAST:  8mL GADAVIST GADOBUTROL 1 MMOL/ML IV SOLN COMPARISON:  CT scan from earlier same day FINDINGS: Lower chest: Unremarkable. Hepatobiliary: No suspicious focal abnormality within the liver parenchyma. No gallbladder wall thickening or pericholecystic fluid. Several very tiny 1-2 mm stones are seen in the dependent gallbladder lumen towards the neck (see axial T2 image 14 of series 9. MRCP imaging is markedly motion degraded. Common duct measures 9 mm diameter. Common bile duct in the head of the pancreas is 7-8 mm diameter. Potential 2 mm stone in the distal common bile duct just proximal to the ampulla (see axial T2 image 19 of series 9). Pancreas: No main duct dilatation. No focal mass lesion. Axial T2 haste imaging raises the question of some mild peripancreatic edema in the head and body region. Spleen:  No splenomegaly. No suspicious focal mass lesion. Adrenals/Urinary Tract: No adrenal nodule or mass. No suspicious enhancing renal mass. Mild right hydroureteronephrosis. Stomach/Bowel: Stomach is moderately distended with food and fluid. Duodenum is normally positioned as is the ligament of Treitz. No small bowel or  colonic dilatation within the visualized abdomen. Vascular/Lymphatic: No abdominal aortic aneurysm. 14 mm short axis hepatoduodenal ligament lymph node seen on 19/4, presumably reactive. 14 mm short axis celiac axis node seen on 16/4. Other:  No intraperitoneal free fluid. Musculoskeletal: No focal suspicious marrow enhancement within the visualized bony anatomy. IMPRESSION: 1. MRCP imaging is markedly motion degraded. Common bile duct in the head of the pancreas measures 7-8 mm diameter. Potential 2 mm stone in the distal common bile duct just proximal to the ampulla. 2. Several very tiny 1-2 mm stones in the dependent gallbladder lumen towards the neck. No gallbladder wall thickening or pericholecystic fluid. 3. Question mild peripancreatic edema in the head and body region. Correlation with lipase recommended. 4. Mild right hydroureteronephrosis. Etiology for this finding is not evident on the current abdominal MRI. 5. Mildly enlarged hepatoduodenal ligament and celiac axis lymph nodes, presumably reactive. Consider follow-up CT in 3 months to ensure stability. Electronically Signed   By: Kennith Center M.D.   On: 08/11/2023 06:29   MR 3D Recon At Scanner  Result Date: 08/11/2023 CLINICAL DATA:  Biliary dilatation with elevated LFTs. EXAM: MRI ABDOMEN WITHOUT AND WITH CONTRAST (INCLUDING MRCP) TECHNIQUE: Multiplanar multisequence MR imaging of the abdomen was performed both before and after the administration of intravenous contrast. Heavily T2-weighted images of the biliary and pancreatic ducts were obtained, and three-dimensional MRCP images were rendered by post processing. CONTRAST:  8mL GADAVIST GADOBUTROL 1 MMOL/ML IV SOLN COMPARISON:  CT scan from earlier same day FINDINGS: Lower chest: Unremarkable. Hepatobiliary: No suspicious focal abnormality within the liver  parenchyma. No gallbladder wall thickening or pericholecystic fluid. Several very tiny 1-2 mm stones are seen in the dependent gallbladder  lumen towards the neck (see axial T2 image 14 of series 9. MRCP imaging is markedly motion degraded. Common duct measures 9 mm diameter. Common bile duct in the head of the pancreas is 7-8 mm diameter. Potential 2 mm stone in the distal common bile duct just proximal to the ampulla (see axial T2 image 19 of series 9). Pancreas: No main duct dilatation. No focal mass lesion. Axial T2 haste imaging raises the question of some mild peripancreatic edema in the head and body region. Spleen:  No splenomegaly. No suspicious focal mass lesion. Adrenals/Urinary Tract: No adrenal nodule or mass. No suspicious enhancing renal mass. Mild right hydroureteronephrosis. Stomach/Bowel: Stomach is moderately distended with food and fluid. Duodenum is normally positioned as is the ligament of Treitz. No small bowel or colonic dilatation within the visualized abdomen. Vascular/Lymphatic: No abdominal aortic aneurysm. 14 mm short axis hepatoduodenal ligament lymph node seen on 19/4, presumably reactive. 14 mm short axis celiac axis node seen on 16/4. Other:  No intraperitoneal free fluid. Musculoskeletal: No focal suspicious marrow enhancement within the visualized bony anatomy. IMPRESSION: 1. MRCP imaging is markedly motion degraded. Common bile duct in the head of the pancreas measures 7-8 mm diameter. Potential 2 mm stone in the distal common bile duct just proximal to the ampulla. 2. Several very tiny 1-2 mm stones in the dependent gallbladder lumen towards the neck. No gallbladder wall thickening or pericholecystic fluid. 3. Question mild peripancreatic edema in the head and body region. Correlation with lipase recommended. 4. Mild right hydroureteronephrosis. Etiology for this finding is not evident on the current abdominal MRI. 5. Mildly enlarged hepatoduodenal ligament and celiac axis lymph nodes, presumably reactive. Consider follow-up CT in 3 months to ensure stability. Electronically Signed   By: Kennith Center M.D.   On:  08/11/2023 06:29   MR Lumbar Spine W Wo Contrast  Result Date: 08/11/2023 CLINICAL DATA:  MSSA seeding of hardware. EXAM: MRI LUMBAR SPINE WITHOUT AND WITH CONTRAST TECHNIQUE: Multiplanar and multiecho pulse sequences of the lumbar spine were obtained without and with intravenous contrast. CONTRAST:  8mL GADAVIST GADOBUTROL 1 MMOL/ML IV SOLN COMPARISON:  05/05/2023 FINDINGS: Segmentation:  Standard. Alignment:  Minimal dextrocurvature Vertebrae: L4-5 and L5-S1 fusion with solid intervertebral arthrodesis. Minor marrow edema in the endplates at L3-4, likely degenerative in the absence of paravertebral edema or disc T2 hyperintensity and in the presence of vacuum phenomenon by prior CT. Postcontrast assessment is limited by metal artifact. Conus medullaris and cauda equina: Conus extends to the L2 level. Conus and cauda equina appear normal. Paraspinal and other soft tissues: Full urinary bladder and right hydronephrosis. The right ureter appears reimplanted and this may be from stricture or reflux. There is contemporaneous abdominal CT. Disc levels: T12- L1: Ventral spondylitic spurring. L1-L2: Disc narrowing and bulging with biforaminal protrusion. Bilateral facet spurring. Moderate left and mild-to-moderate right foraminal narrowing. L2-L3: Circumferential disc bulging. Degenerative facet spurring on both sides. Mild bilateral foraminal narrowing. L3-L4: Disc degeneration with endplate ridging. Facet spurring asymmetric to the left. Moderate left more than right foraminal narrowing. Widely patent spinal canal after laminectomy. L4-L5: Fusion without impingement L5-S1:Fusion without impingement IMPRESSION: 1. No acute finding.  No clear sign of spinal infection. 2. Degenerated and postoperative lumbar spine with up to moderate foraminal narrowing at L1-2 and L3-4. Diffusely patent spinal canal. Electronically Signed   By: Audry Riles.D.  On: 08/11/2023 04:39   US ABDOMEN LIMITED RUQ (LIVER/GB)  Result  Date: 08/11/2023 CLINICAL DATA:  Elevated LFTs EXAM: ULTRASOUND ABDOMEN LIMITED RIGHT UPPER QUADRANT COMPARISON:  CT from earlier in the same day. FINDINGS: Gallbladder: Gallbladder is well distended. No wall thickening or pericholecystic fluid is noted. Small dependent gallstones are seen. Negative sonographic Murphy's sign is elicited. Common bile duct: Diameter: 12 mm tapering to 7 mm. No intrahepatic ductal dilatation is seen. Liver: No focal lesion identified. Within normal limits in parenchymal echogenicity. Portal vein is patent on color Doppler imaging with normal direction of blood flow towards the liver. Other: None. IMPRESSION: Prominent common bile duct. Distal common bile duct stone could not be totally excluded. Cholelithiasis without complicating factors. Electronically Signed   By: Alcide Clever M.D.   On: 08/11/2023 02:53   CT ABDOMEN PELVIS W CONTRAST  Result Date: 08/11/2023 CLINICAL DATA:  Abdominal pain, acute, nonlocalized fever, abd pain EXAM: CT ABDOMEN AND PELVIS WITH CONTRAST TECHNIQUE: Multidetector CT imaging of the abdomen and pelvis was performed using the standard protocol following bolus administration of intravenous contrast. RADIATION DOSE REDUCTION: This exam was performed according to the departmental dose-optimization program which includes automated exposure control, adjustment of the mA and/or kV according to patient size and/or use of iterative reconstruction technique. CONTRAST:  OMNIPAQUE IOHEXOL 300 MG/ML  SOLN COMPARISON:  CT lumbar spine 02/23/2023, CT abdomen pelvis 10/14/2021 FINDINGS: Lower chest: No acute abnormality. Hepatobiliary: No focal liver abnormality. No gallstones, gallbladder wall thickening, or pericholecystic fluid. No biliary dilatation. Pancreas: Diffusely atrophic. No focal lesion. Otherwise normal pancreatic contour. No surrounding inflammatory changes. No main pancreatic ductal dilatation. Spleen: Splenule noted normal in size without focal  abnormality. Adrenals/Urinary Tract: No adrenal nodule bilaterally. Bilateral kidneys enhance symmetrically. Mild right hydroureteronephrosis with abrupt change in caliber at the distal ureter (2:55). No right nephroureterolithiasis. No right hydroureteronephrosis. The urinary bladder is unremarkable. On delayed imaging, there is no urothelial wall thickening and there are no filling defects in the opacified portions of the bilateral collecting systems or ureters. Stomach/Bowel: Stomach is within normal limits. No evidence of bowel wall thickening or dilatation. The appendix is not definitely identified with no inflammatory changes in the right lower quadrant to suggest acute appendicitis. Vascular/Lymphatic: No abdominal aorta or iliac aneurysm. Moderate to severe atherosclerotic plaque of the aorta and its branches. Prominent but nonenlarged mesenteric and retroperitoneal lymphadenopathy with vague small bowel mesenteric haziness. No abdominal, pelvic, or inguinal lymphadenopathy. Reproductive: Prostate is unremarkable. Other: No intraperitoneal free fluid. No intraperitoneal free gas. No organized fluid collection. Musculoskeletal: No abdominal wall hernia or abnormality. Diffusely decreased bone density. No suspicious lytic or blastic osseous lesions. No acute displaced fracture. L4-L5 and L5-S1 interbody surgical hardware. IMPRESSION: 1. Mild right hydroureteronephrosis with abrupt change in caliber at the distal ureter. No nephroureterolithiasis. This may reflect the residual of a recently passed calculus or reflect chronic changes of obstructive uropathy or reflux. 2. Diffusely decreased bone density. 3. Nonspecific small bowel misty mesentery. Electronically Signed   By: Tish Frederickson M.D.   On: 08/11/2023 01:40   DG Chest Port 1 View  Result Date: 08/10/2023 CLINICAL DATA:  Fever history of liver and kidney disease EXAM: PORTABLE CHEST 1 VIEW COMPARISON:  08/23/2022 FINDINGS: Borderline to mild  cardiomegaly. No acute airspace disease or effusion. No pneumothorax. IMPRESSION: No active disease. Borderline to mild cardiomegaly. Electronically Signed   By: Jasmine Pang M.D.   On: 08/10/2023 23:27    Micro Results  Recent Results (from the past 240 hour(s))  SARS Coronavirus 2 by RT PCR (hospital order, performed in [facility] hospital lab) *cepheid single result test* Anterior Nasal Swab     Status: None   Collection Time: 08/10/23 10:49 PM   Specimen: Anterior Nasal Swab  Result Value Ref Range Status   SARS Coronavirus 2 by RT PCR NEGATIVE NEGATIVE Final    Comment: (NOTE) SARS-CoV-2 target nucleic acids are NOT DETECTED.  The SARS-CoV-2 RNA is generally detectable in upper and lower respiratory specimens during the acute phase of infection. The lowest concentration of SARS-CoV-2 viral copies this assay can detect is 250 copies / mL. A negative result does not preclude SARS-CoV-2 infection and should not be used as the sole basis for treatment or other patient management decisions.  A negative result may occur with improper specimen collection / handling, submission of specimen other than nasopharyngeal swab, presence of viral mutation(s) within the areas targeted by this assay, and inadequate number of viral copies (<250 copies / mL). A negative result must be combined with clinical observations, patient history, and epidemiological information.  Fact Sheet for Patients:   RoadLapTop.co.za  Fact Sheet for Healthcare Providers: http://kim-miller.com/  This test is not yet approved or  cleared by the [place] FDA and has been authorized for detection and/or diagnosis of SARS-CoV-2 by FDA under an Emergency Use Authorization (EUA).  This EUA will remain in effect (meaning this test can be used) for the duration of the COVID-19 declaration under Section 564(b)(1) of the Act, 21 U.S.C. section 360bbb-3(b)(1), unless the  authorization is terminated or revoked sooner.  Performed at [facility], [address] [place], [place] [address]   Blood Culture (routine x 2)     Status: None (Preliminary result)   Collection Time: 08/10/23 11:36 PM   Specimen: BLOOD  Result Value Ref Range Status   Specimen Description BLOOD RIGHT ARM  Final   Special Requests   Final    BOTTLES DRAWN AEROBIC AND ANAEROBIC Blood Culture adequate volume   Culture   Final    NO GROWTH 3 DAYS Performed at [facility], [address] [place], [place] [address]    Report Status PENDING  Incomplete  Blood Culture (routine x 2)     Status: None (Preliminary result)   Collection Time: 08/10/23 11:39 PM   Specimen: BLOOD  Result Value Ref Range Status   Specimen Description BLOOD LEFT ARM  Final   Special Requests   Final    BOTTLES DRAWN AEROBIC AND ANAEROBIC Blood Culture adequate volume   Culture   Final    NO GROWTH 3 DAYS Performed at [facility], [address], [place], [place] [address]    Report Status PENDING  Incomplete  MRSA Next Gen by PCR, Nasal     Status: None   Collection Time: 08/11/23  3:56 PM   Specimen: Nasal Mucosa; Nasal Swab  Result Value Ref Range Status   MRSA by PCR Next Gen NOT DETECTED NOT DETECTED Final    Comment: (NOTE) The GeneXpert MRSA Assay (FDA approved for NASAL specimens only), is one component of a comprehensive MRSA colonization surveillance program. It is not intended to diagnose MRSA infection nor to guide or monitor treatment for MRSA infections. Test performance is not FDA approved in patients less than 50 years old. Performed at [facility], [address] [place], [place] [address]     Today   Subjective  Albert Wade today has no headache,no chest abdominal pain,no new weakness tingling or numbness, feels much better wants to go home today.    Objective   Blood pressure (!) 164/65, pulse 60, temperature 98.2 F (36.8 C),  temperature source Oral, resp. rate 15, height 5' 10.51" (1.791 m), weight 82 kg, SpO2 100%.   Intake/Output Summary (Last 24 hours) at 08/14/2023 0657 Last data filed at 08/14/2023 0000 Gross per 24 hour  Intake 1360 ml  Output 510 ml  Net 850 ml    Exam  Awake Alert, No new F.N deficits,    [place].AT,PERRAL Supple Neck,   Symmetrical Chest wall movement, Good air movement bilaterally, CTAB RRR,No Gallops,   +ve B.Sounds, Abd Soft, Non tender,  No Cyanosis, Clubbing or edema    Data Review   Recent Labs  Lab 08/10/23 2248 08/11/23 1640 08/12/23 0644 08/13/23 0244 08/14/23 0425  WBC 8.3 8.9 6.1 5.3 5.6  HGB 10.1* 10.6* 10.2* 11.0* 10.8*  HCT 31.9* 33.0* 31.2* 32.9* 32.6*  PLT 133* 135* 123* 142* 167  MCV 86.4 84.0 83.2 84.1 83.8  MCH 27.4 27.0 27.2 28.1 27.8  MCHC 31.7 32.1 32.7 33.4 33.1  RDW 15.8* 16.1* 16.0* 15.5 15.8*  LYMPHSABS 0.3*  --  0.5* 0.6* 0.5*  MONOABS 0.5  --  0.4 0.3 0.2  EOSABS 0.0  --  0.0 0.0 0.0  BASOSABS 0.0  --  0.0 0.0 0.0    Recent Labs  Lab 08/10/23 2248 08/11/23 0048 08/11/23 1640 08/12/23 0644 08/12/23 0853 08/13/23 0244 08/14/23 0425  NA 137  --   --  141  --  138 140  K 3.9  --   --  3.5  --  3.7 3.6  CL 104  --   --  108  --  104 104  CO2 24  --   --  24  --  24 27  ANIONGAP 9  --   --  9  --  10 9  GLUCOSE 125*  --   --  108*  --  123* 117*  BUN 19  --   --  8  --  10 16  CREATININE 1.01  --  0.83 0.97  --  0.82 0.86  AST 299*  --   --  74*  --  38 32  ALT 166*  --   --  94*  --  74* 56*  ALKPHOS 203*  --   --  179*  --  173* 145*  BILITOT 2.3*  --   --  2.9*  --  1.4* 1.2  ALBUMIN 3.1*  --   --  2.8*  --  2.8* 3.0*  CRP  --   --   --   --  13.3* 10.5* 4.8*  PROCALCITON  --   --   --   --  3.37 2.68 1.35  LATICACIDVEN  --  1.3  --   --   --   --   --   INR 1.1  --  1.2  --   --   --   --   TSH  --   --   --   --   --  1.184  --   BNP  --   --   --  596.3*  --  409.9* 271.8*  MG  --   --   --  1.9  --  2.0 2.0  CALCIUM  7.8*  --   --  8.5*  --  8.8* 8.6*    Total Time in preparing paper work, data evaluation and todays exam - 35 minutes  Signature  -    Susa Raring M.D on 08/14/2023 at 6:57 AM   -  To page go to [email or web address]

## 2023-08-14 NOTE — Progress Notes (Signed)
PT Cancellation Note  Patient Details Name: Albert Wade [name]: [id number] DOB: 21-Sep-1946   Cancelled Treatment:    Reason Eval/Treat Not Completed: PT screened, no needs identified, will sign off (Pt states that he has been up walking around the room independently.)   Gladys Damme 08/14/2023, 8:57 AM

## 2023-08-14 NOTE — Plan of Care (Signed)

## 2023-08-14 NOTE — Progress Notes (Signed)
Patient ID: Albert Wade, male   DOB: Sep 18, 1946, 77 y.o.   [name]: [id number] [facility] Progress Note  1 Day Post-Op  Subjective: CC-  Abdomen sore but pain well controlled. Appetite suppressed but tolerating liquids and no n/v. Has ambulated to the restroom without issues. Passing flatus, no BM.  Objective: Vital signs in last 24 hours: Temp:  [97.7 F (36.5 C)-98.9 F (37.2 C)] 98.2 F (36.8 C) (08/19 0542) Pulse Rate:  [53-68] 53 (08/19 0756) Resp:  [13-20] 16 (08/19 0756) BP: (101-172)/(53-68) 167/68 (08/19 0756) SpO2:  [96 %-100 %] 98 % (08/19 0756) Last BM Date : 08/11/23  Intake/Output from previous day: 08/18 0701 - 08/19 0700 In: 1360 [P.O.:360; I.V.:800; IV Piggyback:200] Out: 510 [Urine:500; Blood:10] Intake/Output this shift: No intake/output data recorded.  PE: Gen:  Alert, NAD, pleasant Abd: soft, ND, appropriately tender over incisions, lap incisions cdi with dermabond present  Lab Results:  Recent Labs    08/13/23 0244 08/14/23 0425  WBC 5.3 5.6  HGB 11.0* 10.8*  HCT 32.9* 32.6*  PLT 142* 167   BMET Recent Labs    08/13/23 0244 08/14/23 0425  NA 138 140  K 3.7 3.6  CL 104 104  CO2 24 27  GLUCOSE 123* 117*  BUN 10 16  CREATININE 0.82 0.86  CALCIUM 8.8* 8.6*   PT/INR Recent Labs    08/11/23 1640  LABPROT 15.2  INR 1.2   CMP     Component Value Date/Time   NA 140 08/14/2023 0425   NA 146 (H) 01/16/2023 1045   K 3.6 08/14/2023 0425   CL 104 08/14/2023 0425   CO2 27 08/14/2023 0425   GLUCOSE 117 (H) 08/14/2023 0425   BUN 16 08/14/2023 0425   BUN 10 01/16/2023 1045   CREATININE 0.86 08/14/2023 0425   CREATININE 1.06 03/04/2016 1618   CALCIUM 8.6 (L) 08/14/2023 0425   PROT 6.3 (L) 08/14/2023 0425   ALBUMIN 3.0 (L) 08/14/2023 0425   AST 32 08/14/2023 0425   ALT 56 (H) 08/14/2023 0425   ALKPHOS 145 (H) 08/14/2023 0425   BILITOT 1.2 08/14/2023 0425   GFRNONAA >60 08/14/2023 0425   GFRAA >60 06/02/2020 0357    Lipase     Component Value Date/Time   LIPASE 32 08/10/2023 2248       Studies/Results: DG ERCP  Result Date: 08/13/2023 CLINICAL DATA:  Choledocholithiasis EXAM: ERCP TECHNIQUE: Multiple spot images obtained with the fluoroscopic device and submitted for interpretation post-procedure. FLUOROSCOPY: Radiation Exposure Index (as provided by the fluoroscopic device): 4.9 mGy Kerma COMPARISON:  MRI abdomen 08/11/2023 FINDINGS: Six intraoperative fluoroscopic images were provided for interpretation. The submitted images demonstrate cannulation and opacification of the common bile duct. No definite filling defects identified on the final image, however portions of the duct are obscured by endoscope. IMPRESSION: No definite filling defects identified within the common bile duct on final ERCP image. Portions of the duct are obscured by endoscope. These images were submitted for radiologic interpretation only. Please see the procedural report for the amount of contrast and the fluoroscopy time utilized. Electronically Signed   By: Acquanetta Belling M.D.   On: 08/13/2023 07:05    Anti-infectives: Anti-infectives (From admission, onward)    Start     Dose/Rate Route Frequency Ordered Stop   08/19/23 0000  cephALEXin (KEFLEX) 500 MG capsule        3,000 mg Oral Daily 08/14/23 0656     08/14/23 0600  ceFAZolin (ANCEF) IVPB 2g/100 mL premix  2 g 200 mL/hr over 30 Minutes Intravenous On call to O.R. 08/13/23 1348 08/13/23 1647   08/14/23 0000  amoxicillin-clavulanate (AUGMENTIN) 875-125 MG tablet        1 tablet Oral 2 times daily 08/14/23 0656     08/13/23 1417  ceFAZolin (ANCEF) 2-4 GM/100ML-% IVPB       Note to Pharmacy: Kathrene Bongo D: cabinet override      08/13/23 1417 08/13/23 1630   08/11/23 1900  piperacillin-tazobactam (ZOSYN) IVPB 3.375 g        3.375 g 12.5 mL/hr over 240 Minutes Intravenous Every 8 hours 08/11/23 1716          Assessment/Plan Gallstone pancreatitis S/p  ERCP 8/17 S/p laparoscopic cholecystectomy 8/18 Dr. Dossie Der - LFTs trending down - Stable for discharge from surgical standpoint. Discharge instructions and follow up info on AVS.    LOS: 3 days    Franne Forts, [facility] 08/14/2023, 8:11 AM Please see Amion for pager number during day hours 7:00am-4:30pm

## 2023-08-14 NOTE — Plan of Care (Signed)
  Problem: Education: Goal: Knowledge of General Education information will improve Description: Including pain rating scale, medication(s)/side effects and non-pharmacologic comfort measures Outcome: Progressing   Problem: Clinical Measurements: Goal: Ability to maintain clinical measurements within normal limits will improve Outcome: Progressing Goal: Will remain free from infection Outcome: Progressing Goal: Respiratory complications will improve Outcome: Progressing   Problem: Activity: Goal: Risk for activity intolerance will decrease Outcome: Progressing   Problem: Coping: Goal: Level of anxiety will decrease Outcome: Progressing   Problem: Elimination: Goal: Will not experience complications related to urinary retention Outcome: Progressing   Problem: Pain Managment: Goal: General experience of comfort will improve Outcome: Progressing   Problem: Safety: Goal: Ability to remain free from injury will improve Outcome: Progressing

## 2023-08-15 ENCOUNTER — Telehealth: Payer: Self-pay | Admitting: *Deleted

## 2023-08-15 ENCOUNTER — Encounter: Payer: Self-pay | Admitting: *Deleted

## 2023-08-15 LAB — CULTURE, BLOOD (ROUTINE X 2)
Culture: NO GROWTH
Culture: NO GROWTH
Special Requests: ADEQUATE
Special Requests: ADEQUATE

## 2023-08-15 LAB — SURGICAL PATHOLOGY

## 2023-08-15 NOTE — Transitions of Care (Post Inpatient/ED Visit) (Signed)
08/15/2023  Name: Albert Wade [name]: [id number] DOB: 08-20-1946  Today's TOC FU Call Status: Today's TOC FU Call Status:: Successful TOC FU Call Completed TOC FU Call Complete Date: 08/15/23  Transition Care Management Follow-up Telephone Call Date of Discharge: 08/14/23 Discharge Facility: [facility]) Type of Discharge: Inpatient Admission Primary Inpatient Discharge Diagnosis:: abdominal pain; cholangitis- ERCP/ lap cholecystectomy How have you been since you were released from the hospital?: Better ("I am okay, I am doing fine.  I am just having a lot of anxiety from being so sick for so long.  I am not taking the Celexa Dr. Jonny Ruiz told me to try- it made me feel worse, even more anxious.  I will talk to him about getting me something else") Any questions or concerns?: No  Items Reviewed: Did you receive and understand the discharge instructions provided?: Yes (thoroughly reviewed with patient/ spouse who verbalizes good understanding of same) Medications obtained,verified, and reconciled?: Yes (Medications Reviewed) (Full medication reconciliation/ review completed; no concerns or discrepancies identified; confirmed patient obtained/ is taking all newly Rx'd medications as instructed; self-manages medications and denies questions/ concerns around medications today) Any new allergies since your discharge?: No Dietary orders reviewed?: Yes Type of Diet Ordered:: Heart Healthy, low salt Do you have support at home?: Yes People in Home: spouse Name of Support/Comfort Primary Source: Reports independent in self-care activities; supportive spouse assists as/ if needed/ indicated  Medications Reviewed Today: Medications Reviewed Today     Reviewed by Michaela Corner, RN (Registered Nurse) on 08/15/23 at 1502  Med List Status: <None>   Medication Order Taking? Sig Documenting Provider Last Dose Status Informant  albuterol (VENTOLIN HFA) 108 (90 Base) MCG/ACT inhaler [id number] Yes  INHALE 2 PUFFS INTO THE LUNGS 4 TIMES DAILY AS NEEDED  Patient taking differently: Inhale 1 puff into the lungs every 6 (six) hours as needed for shortness of breath.   Corwin Levins, MD Taking Active Self  amLODipine (NORVASC) 2.5 MG tablet [id number] Yes TAKE 1 TABLET BY MOUTH DAILY Pricilla Riffle, MD Taking Active Self  amoxicillin-clavulanate (AUGMENTIN) 875-125 MG tablet [id number] Yes Take 1 tablet by mouth 2 (two) times daily. Leroy Sea, MD Taking Active   aspirin 81 MG tablet [id number] Yes Take 81 mg by mouth daily. [provider] Taking Active Self           Med Note Excell Seltzer, MONCHELL R   Wed Jun 15, 2016  8:50 AM)    blood glucose meter kit and supplies KIT [id number] Yes Dispense based on patient and insurance preference. Use up to four times daily as directed. Pennie Banter, DO Taking Active Self  cephALEXin (KEFLEX) 500 MG capsule [id number] Yes Take 6 capsules (3,000 mg total) by mouth daily. Resume as you were taking this before unchanged dose once you have finished Augmentin course in 5 days. Leroy Sea, MD Taking Active            Med Note Michaela Corner   Tue Aug 15, 2023  2:40 PM) 08/15/23: Reports during TOC understanding to resume after he has completed Augmentin therapy post-hospital discharge on 08/14/23  citalopram (CELEXA) 20 MG tablet [id number] No Take 1 tablet (20 mg total) by mouth daily.  Patient not taking: Reported on 08/15/2023   Corwin Levins, MD Not Taking Active Self           Med Note Michaela Corner   Tue Aug 15, 2023  2:42 PM) 08/15/23:  Reports during TOC call he is not currently taking-- would like to talk to PCP about alternative therapy for anxiety/ depression  furosemide (LASIX) 40 MG tablet [id number] Yes 1 tab by mouth once per day as needed Corwin Levins, MD Taking Active Self           Med Note Michaela Corner   Tue Aug 15, 2023  2:42 PM) 08/15/23: reports during [facility] call has not needed recently  omeprazole (PRILOSEC) 20 MG capsule  [id number] Yes TAKE ONE CAPSULE BY MOUTH TWICE A DAY  Patient taking differently: Take 20 mg by mouth daily.   Wyline Mood, MD Taking Active Self  oxyCODONE (OXY IR/ROXICODONE) 5 MG immediate release tablet [id number] Yes Take 1 tablet (5 mg total) by mouth every 6 (six) hours as needed for breakthrough pain. Meuth, Brooke A, PA-C Taking Active   oxycodone (ROXICODONE) 30 MG immediate release tablet [id number] Yes 1 tab by mouth 5 times per day  Patient taking differently: Take 15 mg by mouth See admin instructions. Take 1 tab by mouth 5 times per day per patient   Corwin Levins, MD Taking Active Self  potassium chloride (KLOR-CON 10) 10 MEQ tablet [id number] Yes 1 tab by mouth once daily when taking lasix  Patient taking differently: Take 10 mEq by mouth daily.   Corwin Levins, MD Taking Active Self           Med Note Michaela Corner   Tue Aug 15, 2023  2:44 PM) 08/15/23: Reports during [facility] call this was held during hospital visit-- he has resumed post-hospital discharge    simvastatin (ZOCOR) 40 MG tablet [id number] Yes TAKE 1 TABLET BY MOUTH AT BEDTIME Pricilla Riffle, MD Taking Active Self  traZODone (DESYREL) 50 MG tablet [id number] Yes Take 1 tablet (50 mg total) by mouth at bedtime as needed for sleep. Pennie Banter, DO Taking Active Self           Home Care and Equipment/Supplies: Were Home Health Services Ordered?: No Any new equipment or medical supplies ordered?: No  Functional Questionnaire: Do you need assistance with bathing/showering or dressing?: No Do you need assistance with meal preparation?: Yes (spouse prepares all meals) Do you need assistance with eating?: No Do you have difficulty maintaining continence: No Do you need assistance with getting out of bed/getting out of a chair/moving?: No Do you have difficulty managing or taking your medications?: Yes (Patient essentially manages independently- however, wife assists as needed)  Follow up appointments  reviewed: PCP Follow-up appointment confirmed?: Yes (care coordination outreach in real-time with scheduling care guide to successfully schedule hospital follow up PCP appointment 08/23/23) Date of PCP follow-up appointment?: 08/23/23 Follow-up Provider: PCP Specialist Hospital Follow-up appointment confirmed?: Yes Date of Specialist follow-up appointment?: 09/05/23 (verified this is recommended time frame for follow up per hospital discharging provider notes) Follow-Up Specialty Provider:: surgical provider Do you need transportation to your follow-up appointment?: No Do you understand care options if your condition(s) worsen?: Yes-patient verbalized understanding  SDOH Interventions Today    Flowsheet Row Most Recent Value  SDOH Interventions   Food Insecurity Interventions Intervention Not Indicated  Transportation Interventions Intervention Not Indicated  [reports drives self,  wife assists as needed]      TOC Interventions Today    Flowsheet Row Most Recent Value  TOC Interventions   TOC Interventions Discussed/Reviewed TOC Interventions Discussed, Arranged PCP follow up less than 12 days/Care Guide scheduled  [provided my direct contact information should  questions/ concerns/ needs arise post-TOC call, prior to RN CM telephone visit]      Interventions Today    Flowsheet Row Most Recent Value  Chronic Disease   Chronic disease during today's visit Other  [recent lap cholecystectomy]  General Interventions   General Interventions Discussed/Reviewed General Interventions Discussed, Doctor Visits, Durable Medical Equipment (DME), Referral to Nurse, Communication with  [scheduled with RN CM Care Coordinator for follow up telephone visit on 08/29/23]  Doctor Visits Discussed/Reviewed PCP, Specialist, Doctor Visits Discussed  Durable Medical Equipment (DME) Other  [confirmed not currently requiring/ using assistive devices]  PCP/Specialist Visits Compliance with follow-up visit   Communication with RN  Exercise Interventions   Exercise Discussed/Reviewed Exercise Discussed  [confirmed patient is walking for exercise post-recent surgery,  encouraged him to stay active, but to pace/ progress activity gradually and not over-do]  Education Interventions   Education Provided Provided Education  Provided Verbal Education On When to see the doctor, Labs, Mental Health/Coping with Illness  [depression screening completed,  reviewed recent K+ values,  with much effort, convinced patient to allow me to schedule HFU OV with PCP-- he did not want to schedule or attend, purpose of/ importance of having updated DPR completed]  Labs Reviewed --  [K+ levels]  Nutrition Interventions   Nutrition Discussed/Reviewed Nutrition Discussed  Pharmacy Interventions   Pharmacy Dicussed/Reviewed Pharmacy Topics Discussed  [Full medication review with updating medication list in EHR per patient report]  Safety Interventions   Safety Discussed/Reviewed Safety Discussed  [Reinforced safety with resuming post-op driving,  reminded to not drive if he is taking narcotic pain medication]      Caryl Pina, RN, BSN, CCRN Alumnus RN CM Care Coordination/ Transition of Care- [facility] Care Management [phone]: direct office

## 2023-08-23 ENCOUNTER — Ambulatory Visit: Payer: Medicare Other | Admitting: Internal Medicine

## 2023-08-23 ENCOUNTER — Encounter: Payer: Self-pay | Admitting: Internal Medicine

## 2023-08-23 VITALS — BP 122/64 | HR 55 | Temp 98.2°F | Ht 70.0 in | Wt 168.0 lb

## 2023-08-23 DIAGNOSIS — I499 Cardiac arrhythmia, unspecified: Secondary | ICD-10-CM | POA: Diagnosis not present

## 2023-08-23 DIAGNOSIS — R935 Abnormal findings on diagnostic imaging of other abdominal regions, including retroperitoneum: Secondary | ICD-10-CM

## 2023-08-23 DIAGNOSIS — N1330 Unspecified hydronephrosis: Secondary | ICD-10-CM | POA: Diagnosis not present

## 2023-08-23 DIAGNOSIS — K8309 Other cholangitis: Secondary | ICD-10-CM

## 2023-08-23 LAB — BASIC METABOLIC PANEL WITH GFR
BUN: 18 mg/dL (ref 6–23)
CO2: 31 meq/L (ref 19–32)
Calcium: 8.9 mg/dL (ref 8.4–10.5)
Chloride: 100 meq/L (ref 96–112)
Creatinine, Ser: 0.86 mg/dL (ref 0.40–1.50)
GFR: 83.53 mL/min
Glucose, Bld: 117 mg/dL — ABNORMAL HIGH (ref 70–99)
Potassium: 4.7 meq/L (ref 3.5–5.1)
Sodium: 138 meq/L (ref 135–145)

## 2023-08-23 LAB — CBC WITH DIFFERENTIAL/PLATELET
Basophils Absolute: 0.1 10*3/uL (ref 0.0–0.1)
Basophils Relative: 0.8 % (ref 0.0–3.0)
Eosinophils Absolute: 0.3 10*3/uL (ref 0.0–0.7)
Eosinophils Relative: 3.9 % (ref 0.0–5.0)
HCT: 33.7 % — ABNORMAL LOW (ref 39.0–52.0)
Hemoglobin: 11 g/dL — ABNORMAL LOW (ref 13.0–17.0)
Lymphocytes Relative: 14.2 % (ref 12.0–46.0)
Lymphs Abs: 1 10*3/uL (ref 0.7–4.0)
MCHC: 32.5 g/dL (ref 30.0–36.0)
MCV: 84.4 fl (ref 78.0–100.0)
Monocytes Absolute: 0.5 10*3/uL (ref 0.1–1.0)
Monocytes Relative: 7.4 % (ref 3.0–12.0)
Neutro Abs: 5.3 10*3/uL (ref 1.4–7.7)
Neutrophils Relative %: 73.7 % (ref 43.0–77.0)
Platelets: 308 10*3/uL (ref 150.0–400.0)
RBC: 3.99 Mil/uL — ABNORMAL LOW (ref 4.22–5.81)
RDW: 16.4 % — ABNORMAL HIGH (ref 11.5–15.5)
WBC: 7.2 10*3/uL (ref 4.0–10.5)

## 2023-08-23 LAB — HEPATIC FUNCTION PANEL
ALT: 16 U/L (ref 0–53)
AST: 22 U/L (ref 0–37)
Albumin: 3.6 g/dL (ref 3.5–5.2)
Alkaline Phosphatase: 114 U/L (ref 39–117)
Bilirubin, Direct: 0.2 mg/dL (ref 0.0–0.3)
Total Bilirubin: 0.5 mg/dL (ref 0.2–1.2)
Total Protein: 6.6 g/dL (ref 6.0–8.3)

## 2023-08-23 NOTE — Progress Notes (Signed)
The test results show that your current treatment is OK, as the tests are stable.  Please continue the same plan.  There is no other need for change of treatment or further evaluation based on these results, at this time.  thanks 

## 2023-08-23 NOTE — Patient Instructions (Addendum)
Please continue all other medications as before, and refills have been done if requested.  Please have the pharmacy call with any other refills you may need.  Please continue your efforts at being more active, low cholesterol diet, and weight control.  Please keep your appointments with your specialists as you may have planned - general surgury sept 10  You will be contacted regarding the referral for: urology, gastroenterology, and cardiology  I think we can hold on follow up chest xray today  Please go to the LAB at the blood drawing area for the tests to be done  You will be contacted by phone if any changes need to be made immediately.  Otherwise, you will receive a letter about your results with an explanation, but please check with MyChart first.

## 2023-08-23 NOTE — Progress Notes (Signed)
Patient ID: Albert Wade, male   DOB: 1946-10-12, 78 y.o.   [name]: [id number]        Chief Complaint: follow up post hopsn aug 16 - 19       HPI:  Albert Wade is a 77 y.o. male here with c/o recent hospn with confusion, fever and choledocholithiasis, initially seen in [facility] then transferred to [facility], admitted and seen per Dr Rhea Belton, had ERCP Saturday, then sun was Ccx , then d/c home with oral augmentin.   Also noted on CT was right hydronephrosis, and MRCP with prominent hepatoduodenal ligament on MRCP .  Also wife concerned as she noted and confirmed by staff in hosp that pt has intermittent afib, and wants to have him seen per cardiology as well.  Has General surgury f/u planned sept 10.  Needs referral urology and GI.  Pt denies chest pain, increased sob or doe, wheezing, orthopnea, PND, increased LE swelling, palpitations, dizziness or syncope.   Pt denies polydipsia, polyuria, or new focal neuro s/s.  Denies worsening reflux, abd pain, dysphagia, n/v, bowel change or blood.       Transitional Care Management elements noted today: 1)  Date of D/C: as above 2)  Medication reconciliation:  done today at end visit 3)  Review of D/C summary or other information:  done today 4)  Review of need for f/u on pending diagnostic tests and treatments:  done today - none 5)  Review of need for Interaction with other providers who will assume or resume care of pt specific problems: done today- for cardiology, GI and urology referrals 6)  Education of patient/family/guardian or caregiver: done today - wife with him  Wt Readings from Last 3 Encounters:  08/23/23 168 lb (76.2 kg)  08/11/23 180 lb 12.4 oz (82 kg)  08/10/23 181 lb 7 oz (82.3 kg)   BP Readings from Last 3 Encounters:  08/23/23 122/64  08/14/23 (!) 167/68  08/12/23 139/73         Past Medical History:  Diagnosis Date   Acute bronchitis 01/01/2015   Acute upper respiratory infection 05/19/2016   Anginal pain (HCC)     Anxiety    Arthritis    back,wrists,hx. previous fractures   BPH (benign prostatic hypertrophy)    BRONCHITIS, CHRONIC 04/29/2008   Qualifier: Diagnosis of  By: Craige Cotta MD, Vineet     CAD 10/16/2009   Qualifier: Diagnosis of  By: Oswald Hillock     CAD (coronary artery disease)    angioplasty    Chronic back pain    Chronic bronchitis    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/2016   Confusion 04/27/2017   COPD (chronic obstructive pulmonary disease) (HCC)    Depression    Encounter for preventative adult health care exam with abnormal findings 12/20/2013   Fever 04/27/2017   GERD (gastroesophageal reflux disease)    Gynecomastia 05/12/2015   H/O hiatal hernia    pts wife states pt does not have    Heart murmur    History of kidney stones    HTN (hypertension)    Hypercholesteremia    Hyperglycemia 05/19/2016   HYPERLIPIDEMIA 04/29/2008   Qualifier: Diagnosis of  By: Marinus Maw     Impaired glucose tolerance 12/20/2013   Insomnia 04/27/2017   Lumbosacral radiculopathy 10/14/2016   MI (myocardial infarction) [facility])    mid-'90's   MYOCARDIAL INFARCTION 04/29/2008   Qualifier: History of  By: Marinus Maw     OSA (obstructive  sleep apnea) 05/13/2011   Split night study 2011:  AHI 21/hr with obstructive and central events.  Placed on cpap and titrated to 13cm with reasonable control, but attempts to increase pressure increased the number of central events.  Medium resmed quattro full face mask used.     Peripheral neuropathy    peripheral neuropathy   Phimosis/adherent prepuce 10/21/2013   Pneumonia    Prostate cancer (HCC)    Shortness of breath 11/05/2010   Qualifier: Diagnosis of  By: Annamary Rummage, RN, BSN, Jacquelyn     Sleep apnea    mild-no cpap, unable to tolerate   Squamous cell carcinoma of skin 10/29/2020   right mid volar forearm   Squamous cell carcinoma of skin 01/09/2023   Left forearm - [facility]   Umbilical hernia    Umbilical hernia without  obstruction and without gangrene 05/19/2016   URI (upper respiratory infection) 04/27/2017   UTI (urinary tract infection) 12/01/2016   Varicose veins    Varicose veins of bilateral lower extremities with other complications 10/14/2016   Wheezing 01/01/2015   Past Surgical History:  Procedure Laterality Date   AMPUTATION TOE Right 04/01/2022   Procedure: AMPUTATION TOE-Right Great Toe Amputation;  Surgeon: Edwin Cap, DPM;  Location: [facility] ORS;  Service: Podiatry;  Laterality: Right;   ANGIOPLASTY     APPENDECTOMY     ARTERY BIOPSY Right 10/15/2021   Procedure: BIOPSY TEMPORAL ARTERY;  Surgeon: Sung Amabile, DO;  Location: [facility];  Service: General;  Laterality: Right;   BACK SURGERY     x3   CARDIAC CATHETERIZATION     11'11   CHOLECYSTECTOMY N/A 08/13/2023   Procedure: LAPAROSCOPIC CHOLECYSTECTOMY;  Surgeon: [name], MD;  Location: [facility];  Service: General;  Laterality: N/A;   CIRCUMCISION N/A 10/21/2013   Procedure: CIRCUMCISION ADULT;  Surgeon: Valetta Fuller, MD;  Location: [facility];  Service: Urology;  Laterality: N/A;   COLONOSCOPY WITH PROPOFOL N/A 06/09/2021   Procedure: COLONOSCOPY WITH PROPOFOL;  Surgeon: Wyline Mood, MD;  Location: [facility] ENDOSCOPY;  Service: Gastroenterology;  Laterality: N/A;   CYSTOSCOPY N/A 10/21/2013   Procedure: CYSTOSCOPY FLEXIBLE;  Surgeon: Valetta Fuller, MD;  Location: [facility];  Service: Urology;  Laterality: N/A;   CYSTOSCOPY W/ URETERAL STENT PLACEMENT Right 06/01/2020   Procedure: CYSTOSCOPY WITH RETROGRADE PYELOGRAM/URETERAL STENT Exchange;  Surgeon: Vanna Scotland, MD;  Location: [facility];  Service: Urology;  Laterality: Right;   CYSTOSCOPY WITH BIOPSY Right 01/06/2020   Procedure: CYSTOSCOPY WITH BIOPSY;  Surgeon: Vanna Scotland, MD;  Location: [facility];  Service: Urology;  Laterality: Right;   CYSTOSCOPY/URETEROSCOPY/HOLMIUM LASER/STENT PLACEMENT Right 01/06/2020   Procedure: CYSTOSCOPY/URETEROSCOPY/HOLMIUM LASER/STENT PLACEMENT;   Surgeon: Vanna Scotland, MD;  Location: [facility];  Service: Urology;  Laterality: Right;   ERCP N/A 08/12/2023   Procedure: ENDOSCOPIC RETROGRADE CHOLANGIOPANCREATOGRAPHY (ERCP);  Surgeon: Lynann Bologna, MD;  Location: [facility] ENDOSCOPY;  Service: Gastroenterology;  Laterality: N/A;   ESOPHAGOGASTRODUODENOSCOPY N/A 02/03/2021   Procedure: ESOPHAGOGASTRODUODENOSCOPY (EGD);  Surgeon: Wyline Mood, MD;  Location: [facility] ENDOSCOPY;  Service: Gastroenterology;  Laterality: N/A;   ESOPHAGOGASTRODUODENOSCOPY (EGD) WITH PROPOFOL N/A 03/09/2021   Procedure: ESOPHAGOGASTRODUODENOSCOPY (EGD) WITH PROPOFOL;  Surgeon: Wyline Mood, MD;  Location: [facility] ENDOSCOPY;  Service: Gastroenterology;  Laterality: N/A;   HERNIA REPAIR     INSERTION OF MESH N/A 06/17/2016   Procedure: INSERTION OF MESH;  Surgeon: Glenna Fellows, MD;  Location: [facility];  Service: General;  Laterality: N/A;   REMOVAL OF STONES  08/12/2023   Procedure: REMOVAL  OF STONES;  Surgeon: Lynann Bologna, MD;  Location: [facility]COPY;  Service: Gastroenterology;;   Dennison Mascot  08/12/2023   Procedure: Dennison Mascot;  Surgeon: Lynann Bologna, MD;  Location: [facility];  Service: Gastroenterology;;   UMBILICAL HERNIA REPAIR N/A 06/17/2016   Procedure: REPAIR UMBILICAL HERNIA WITH MESH;  Surgeon: Glenna Fellows, MD;  Location: [facility];  Service: General;  Laterality: N/A;   WRIST SURGERY Right     reports that he quit smoking about 27 years ago. His smoking use included cigarettes. He started smoking about 67 years ago. He has a 20 pack-year smoking history. He has never used smokeless tobacco. He reports that he does not drink alcohol and does not use drugs. family history includes Emphysema in his father; Hyperlipidemia in his father and mother; Prostate cancer in his father. Allergies  Allergen Reactions   Cyclobenzaprine     agitation    Seroquel [Quetiapine] Hives   Current Outpatient Medications on File Prior to Visit  Medication Sig Dispense Refill    albuterol (VENTOLIN HFA) 108 (90 Base) MCG/ACT inhaler INHALE 2 PUFFS INTO THE LUNGS 4 TIMES DAILY AS NEEDED (Patient taking differently: Inhale 1 puff into the lungs every 6 (six) hours as needed for shortness of breath.) 8.5 g 11   amLODipine (NORVASC) 2.5 MG tablet TAKE 1 TABLET BY MOUTH DAILY 90 tablet 2   amoxicillin-clavulanate (AUGMENTIN) 875-125 MG tablet Take 1 tablet by mouth 2 (two) times daily. 10 tablet 0   aspirin 81 MG tablet Take 81 mg by mouth daily.     blood glucose meter kit and supplies KIT Dispense based on patient and insurance preference. Use up to four times daily as directed. 1 each 0   cephALEXin (KEFLEX) 500 MG capsule Take 6 capsules (3,000 mg total) by mouth daily. Resume as you were taking this before unchanged dose once you have finished Augmentin course in 5 days.     citalopram (CELEXA) 20 MG tablet Take 1 tablet (20 mg total) by mouth daily. 90 tablet 3   furosemide (LASIX) 40 MG tablet 1 tab by mouth once per day as needed 90 tablet 3   omeprazole (PRILOSEC) 20 MG capsule TAKE ONE CAPSULE BY MOUTH TWICE A DAY (Patient taking differently: Take 20 mg by mouth daily.) 180 capsule 3   oxyCODONE (OXY IR/ROXICODONE) 5 MG immediate release tablet Take 1 tablet (5 mg total) by mouth every 6 (six) hours as needed for breakthrough pain. 15 tablet 0   oxycodone (ROXICODONE) 30 MG immediate release tablet 1 tab by mouth 5 times per day (Patient taking differently: Take 15 mg by mouth See admin instructions. Take 1 tab by mouth 5 times per day per patient) 30 tablet 0   potassium chloride (KLOR-CON 10) 10 MEQ tablet 1 tab by mouth once daily when taking lasix (Patient taking differently: Take 10 mEq by mouth daily.) 90 tablet 3   simvastatin (ZOCOR) 40 MG tablet TAKE 1 TABLET BY MOUTH AT BEDTIME 90 tablet 1   traZODone (DESYREL) 50 MG tablet Take 1 tablet (50 mg total) by mouth at bedtime as needed for sleep. 30 tablet 0   No current facility-administered medications on file  prior to visit.        ROS:  All others reviewed and negative.  Objective        PE:  BP 122/64 (BP Location: Right Arm, Patient Position: Sitting, Cuff Size: Normal)   Pulse (!) 55   Temp 98.2 F (36.8 C) (Oral)  Ht 5\' 10"  (1.778 m)   Wt 168 lb (76.2 kg)   SpO2 98%   BMI 24.11 kg/m                 Constitutional: Pt appears in NAD               HENT: Head: NCAT.                Right Ear: External ear normal.                 Left Ear: External ear normal.                Eyes: . Pupils are equal, round, and reactive to light. Conjunctivae and EOM are normal               Nose: without d/c or deformity               Neck: Neck supple. Gross normal ROM               Cardiovascular: Normal rate and regular rhythm.                 Pulmonary/Chest: Effort normal and breath sounds without rales or wheezing.                Abd:  Soft, NT, ND, + BS, no organomegaly               Neurological: Pt is alert. At baseline orientation, motor grossly intact               Skin: Skin is warm. No rashes, no other new lesions, LE edema - none               Psychiatric: Pt behavior is normal without agitation   Micro: none  Cardiac tracings I have personally interpreted today:  none  Pertinent Radiological findings (summarize): none   Lab Results  Component Value Date   WBC 7.2 08/23/2023   HGB 11.0 (L) 08/23/2023   HCT 33.7 (L) 08/23/2023   PLT 308.0 08/23/2023   GLUCOSE 117 (H) 08/23/2023   CHOL 134 07/05/2023   TRIG 96.0 07/05/2023   HDL 44.50 07/05/2023   LDLDIRECT 78.0 05/19/2016   LDLCALC 70 07/05/2023   ALT 16 08/23/2023   AST 22 08/23/2023   NA 138 08/23/2023   K 4.7 08/23/2023   CL 100 08/23/2023   CREATININE 0.86 08/23/2023   BUN 18 08/23/2023   CO2 31 08/23/2023   TSH 1.184 08/13/2023   PSA 0.03 (L) 07/05/2023   INR 1.2 08/11/2023   HGBA1C 6.4 07/05/2023   MICROALBUR 1.4 07/05/2023   Assessment/Plan:  Albert Wade is a 77 y.o. White or Caucasian [1] male with   has a past medical history of Acute bronchitis (01/01/2015), Acute upper respiratory infection (05/19/2016), Anginal pain (HCC), Anxiety, Arthritis, BPH (benign prostatic hypertrophy), BRONCHITIS, CHRONIC (04/29/2008), CAD (10/16/2009), CAD (coronary artery disease), Chronic back pain, Chronic bronchitis, Chronic low back pain with sciatica (10/14/2016), Confusion (04/27/2017), COPD (chronic obstructive pulmonary disease) (HCC), Depression, Encounter for preventative adult health care exam with abnormal findings (12/20/2013), Fever (04/27/2017), GERD (gastroesophageal reflux disease), Gynecomastia (05/12/2015), H/O hiatal hernia, Heart murmur, History of kidney stones, HTN (hypertension), Hypercholesteremia, Hyperglycemia (05/19/2016), HYPERLIPIDEMIA (04/29/2008), Impaired glucose tolerance (12/20/2013), Insomnia (04/27/2017), Lumbosacral radiculopathy (10/14/2016), MI (myocardial infarction) (HCC), MYOCARDIAL INFARCTION (04/29/2008), OSA (obstructive sleep apnea) (05/13/2011), Peripheral neuropathy, Phimosis/adherent prepuce (10/21/2013), Pneumonia, Prostate cancer (HCC), Shortness of breath (11/05/2010),  Sleep apnea, Squamous cell carcinoma of skin (10/29/2020), Squamous cell carcinoma of skin (01/09/2023), Umbilical hernia, Umbilical hernia without obstruction and without gangrene (05/19/2016), URI (upper respiratory infection) (04/27/2017), UTI (urinary tract infection) (12/01/2016), Varicose veins, Varicose veins of bilateral lower extremities with other complications (10/14/2016), and Wheezing (01/01/2015).  Cholangitis Resolved,  to f/u any worsening symptoms or concerns   Cardiac arrhythmia Details not clear, for referral cardiology per wife request  Hydronephrosis Etiology unclaer, for urology referral  Abnormal CT of the abdomen With prominent hepatoduodenal ligament on MRCP , ok for referral GI  Followup: Return in about 4 months (around 12/23/2023).  Oliver Barre, MD 08/26/2023 2:15  PM [facility]  Primary Care - [facility]

## 2023-08-26 ENCOUNTER — Encounter: Payer: Self-pay | Admitting: Internal Medicine

## 2023-08-26 DIAGNOSIS — N1330 Unspecified hydronephrosis: Secondary | ICD-10-CM | POA: Insufficient documentation

## 2023-08-26 DIAGNOSIS — R935 Abnormal findings on diagnostic imaging of other abdominal regions, including retroperitoneum: Secondary | ICD-10-CM | POA: Insufficient documentation

## 2023-08-26 DIAGNOSIS — I499 Cardiac arrhythmia, unspecified: Secondary | ICD-10-CM | POA: Insufficient documentation

## 2023-08-26 NOTE — Assessment & Plan Note (Signed)
Etiology unclaer, for urology referral

## 2023-08-26 NOTE — Assessment & Plan Note (Signed)
Resolved,  to f/u any worsening symptoms or concerns  

## 2023-08-26 NOTE — Assessment & Plan Note (Signed)
With prominent hepatoduodenal ligament on MRCP , ok for referral GI

## 2023-08-26 NOTE — Assessment & Plan Note (Signed)
Details not clear, for referral cardiology per wife request

## 2023-08-29 ENCOUNTER — Ambulatory Visit: Payer: Self-pay

## 2023-08-29 NOTE — Patient Outreach (Signed)
  Care Coordination   Initial Visit Note   08/29/2023 Name: JEREME DETORO [name]: [id number] DOB: [birth year]  HIRO DUFOUR is a 77 y.o. year old male who sees Corwin Levins, MD for primary care. I spoke with Celestia Khat, wife by phone today.  What matters to the patients health and wellness today?  She reports Mr. Ramaley is asleep and states she just got home. She request care guide call to reschedule telephone visit.   Goals Addressed             This Visit's Progress    Care Coordination Activities       Interventions Today    Flowsheet Row Most Recent Value  Chronic Disease   Chronic disease during today's visit Other  [admission 08/11/23-08/14/23 with cholangitis- cholecystectomy on 08/13/23]  General Interventions   General Interventions Discussed/Reviewed --  [referral to care guide to reschedule telephone assessment]  Doctor Visits Discussed/Reviewed Doctor Visits Reviewed  PCP/Specialist Visits Compliance with follow-up visit  [per review of chart: Primary care provider appointment completed 08/23/23]            SDOH assessments and interventions completed:  No  Care Coordination Interventions:  Yes, provided   Follow up plan:  care guide to reschedule telephone visit    Encounter Outcome:  Pt. Visit Completed   Kathyrn Sheriff, RN, MSN, BSN, CCM Care Management Coordinator [phone]

## 2023-09-01 ENCOUNTER — Telehealth: Payer: Self-pay | Admitting: *Deleted

## 2023-09-01 NOTE — Progress Notes (Signed)
  Care Coordination Note  09/01/2023 Name: Albert Wade [name]: [id number] DOB: [birth year]  Albert Wade is a 77 y.o. year old male who is a primary care patient of Corwin Levins, MD and is actively engaged with the care management team. I reached out to Bedelia Person by phone today to assist with re-scheduling a follow up visit with the RN Case Manager  Follow up plan: Unsuccessful telephone outreach attempt made. A HIPAA compliant phone message was left for the patient providing contact information and requesting a return call.   [name], CCMA Care Coordination Care Guide Direct Dial: [phone]

## 2023-09-05 NOTE — Progress Notes (Signed)
  Care Coordination Note  09/05/2023 Name: Albert Wade [name]: [id number] DOB: Jun 23, 1946  Albert Wade is a 77 y.o. year old male who is a primary care patient of Corwin Levins, MD and is actively engaged with the care management team. I reached out to Bedelia Person by phone today to assist with re-scheduling an initial visit with the RN Case Manager  Follow up plan: We have been unable to make contact with the patient for follow up.  [name], CCMA Care Coordination Care Guide Direct Dial: [phone]

## 2023-09-11 DIAGNOSIS — M5416 Radiculopathy, lumbar region: Secondary | ICD-10-CM | POA: Diagnosis not present

## 2023-09-11 DIAGNOSIS — G894 Chronic pain syndrome: Secondary | ICD-10-CM | POA: Diagnosis not present

## 2023-09-11 DIAGNOSIS — G4709 Other insomnia: Secondary | ICD-10-CM | POA: Diagnosis not present

## 2023-09-11 DIAGNOSIS — M47812 Spondylosis without myelopathy or radiculopathy, cervical region: Secondary | ICD-10-CM | POA: Diagnosis not present

## 2023-09-11 DIAGNOSIS — Z79899 Other long term (current) drug therapy: Secondary | ICD-10-CM | POA: Diagnosis not present

## 2023-09-11 DIAGNOSIS — M48061 Spinal stenosis, lumbar region without neurogenic claudication: Secondary | ICD-10-CM | POA: Diagnosis not present

## 2023-09-18 ENCOUNTER — Ambulatory Visit: Payer: Medicare Other | Admitting: Dermatology

## 2023-09-18 ENCOUNTER — Other Ambulatory Visit: Payer: Self-pay

## 2023-09-18 ENCOUNTER — Telehealth: Payer: Self-pay | Admitting: Internal Medicine

## 2023-09-18 MED ORDER — FUROSEMIDE 40 MG PO TABS: ORAL_TABLET | ORAL | 3 refills | Status: DC

## 2023-09-18 NOTE — Telephone Encounter (Signed)
Refill Sent. 

## 2023-09-18 NOTE — Telephone Encounter (Signed)
Patient is taking 2 tablets of lasix a day so his prescription has run out.  He will need a new prescription sent to [facility] in [place].  Please call patient and let him know this has been done.

## 2023-09-20 ENCOUNTER — Ambulatory Visit: Payer: Medicare Other | Admitting: Dermatology

## 2023-09-21 ENCOUNTER — Ambulatory Visit: Payer: Self-pay

## 2023-09-21 NOTE — Patient Outreach (Signed)
Care Coordination   Case Closure  Visit Note   09/21/2023 Name: Albert Wade [name]: [id number] DOB: 1946-11-24  Albert Wade is a 77 y.o. year old male who sees Corwin Levins, MD for primary care.   RNCM received message from care guide-unsuccessful outreach attempts for follow up. Case closed.    Goals Addressed             This Visit's Progress    COMPLETED: Care Coordination Activities       Interventions Today    Flowsheet Row Most Recent Value  General Interventions   General Interventions Discussed/Reviewed General Interventions Reviewed  [case closed-unable to make contact with patient for follow up]            SDOH assessments and interventions completed:  No  Care Coordination Interventions:  No, not indicated   Follow up plan: No further intervention required.   Encounter Outcome:  Patient Visit Completed   Kathyrn Sheriff, RN, MSN, BSN, CCM Care Management Coordinator [phone]

## 2023-10-23 ENCOUNTER — Telehealth: Payer: Self-pay | Admitting: Internal Medicine

## 2023-10-23 DIAGNOSIS — M79604 Pain in right leg: Secondary | ICD-10-CM

## 2023-10-23 DIAGNOSIS — I10 Essential (primary) hypertension: Secondary | ICD-10-CM

## 2023-10-23 DIAGNOSIS — I83893 Varicose veins of bilateral lower extremities with other complications: Secondary | ICD-10-CM

## 2023-10-23 DIAGNOSIS — I5032 Chronic diastolic (congestive) heart failure: Secondary | ICD-10-CM

## 2023-10-23 NOTE — Telephone Encounter (Signed)
Spoke with Corrie Dandy. They would like a referral to Dr Shirlee Latch. Told her Dr Tenny Craw would be in the office tomorrow (10/29) and we work on that for [employer].

## 2023-10-23 NOTE — Telephone Encounter (Signed)
Pt wants a referral to see Dr Shirlee Latch. She would like a call back

## 2023-10-24 NOTE — Telephone Encounter (Signed)
Pts wife advised and referral placed for [facility] office per her request.

## 2023-10-25 ENCOUNTER — Ambulatory Visit: Payer: Medicare HMO | Attending: Cardiology | Admitting: Cardiology

## 2023-10-25 ENCOUNTER — Encounter: Payer: Self-pay | Admitting: Cardiology

## 2023-10-25 VITALS — BP 155/62 | HR 56 | Wt 173.0 lb

## 2023-10-25 DIAGNOSIS — I2510 Atherosclerotic heart disease of native coronary artery without angina pectoris: Secondary | ICD-10-CM

## 2023-10-25 DIAGNOSIS — I10 Essential (primary) hypertension: Secondary | ICD-10-CM | POA: Diagnosis not present

## 2023-10-25 DIAGNOSIS — I5032 Chronic diastolic (congestive) heart failure: Secondary | ICD-10-CM

## 2023-10-25 MED ORDER — AMLODIPINE BESYLATE 5 MG PO TABS: 5.0000 mg | ORAL_TABLET | Freq: Every day | ORAL | 3 refills | Status: DC

## 2023-10-25 NOTE — Patient Instructions (Addendum)
Medication Changes:  Increase amlodipine 5 mg (1 tablet) daily.   Lab Work:  Labs done today, your results will be available in MyChart, we will contact you for abnormal readings.    Testing/Procedures:  Your physician has requested that you have an echocardiogram. Echocardiography is a painless test that uses sound waves to create images of your heart. It provides your doctor with information about the size and shape of your heart and how well your heart's chambers and valves are working. This procedure takes approximately one hour. There are no restrictions for this procedure. Please do NOT wear cologne, perfume, aftershave, or lotions (deodorant is allowed). Please arrive 15 minutes prior to your appointment time.  Call this number if you need to reschedule: [phone]    FDG Viability Cardiac PET Scan Instructions Your doctor has ordered a cardiac viability PET/CT for you. This test uses a small amount of a radioactive imaging agent to look at the heart muscle. This test is helpful to check the blood supply to your heart and how well the heart muscle takes up sugar (viability). For this test you will have pictures of your heart taken twice with different imaging agents. Where To Go: Your test is scheduled at [facility], [address] [place], [place] [address] When you arrive come in the main hospital entrance. At the front desk let them know you have an appointment in Radiology. They will give you a green card with instructions to get to Radiology. Check in at Radiology. After checking in, please be seated in the waiting room.  To Prepare for Your Test: No food or drink after midnight the night before your appointment. You may drink plain water (without additives or flavorings).  If you are anxious or claustrophobic, please speak with your referring physician about taking anxiety medications prior to your appointment. We are not able to prescribe or provide anxiety  medication for this test.  Avoid heavy lifting or strenuous exercise or activity the day before your test. You may take your daily medications per usual - exceptions below: If you use long-acting insulin, please take half your usual dose the evening before the test (including but not limited to: Lantus, Toujeo, Basaglar, Tresiba, Levemir, Humalog N, Humulin N, Novolog N, or Novalin N).  If you are on a continuous glucose monitor, you may have to remove it for the test. Please plan accordingly.    On the Day of Your Cardiac Viability PET/CT test: Wear loose, comfortable clothing. Leave jewelry and valuables at home.  Do not take any insulin or oral medications for diabetes the morning of your test (including but not limited to metformin also know as Glucophage). Bring a list of your medications with you and bring any medications that you may need to take throughout the day.  After checking in for your test, a technologist or nurse will explain the procedure and answer any questions you may have. An IV will be started in your arm and EKG patches will be placed on your chest. You will need to drink a sugary drink. At this point you will lay down on your back on the PET/CT scanner and we will connect the EKG patches to leads that are attached to the scanner. This allows [place] to measure your heart's activity during the scan. You will be given a small amount of a radioactive imaging agent and we will take continuous pictures for 7 minutes.  After imaging, you will be taken to a room with  a recliner and your blood sugar will be checked. We will monitor your blood sugar several times during the test. If needed, you may be given insulin to help get your sugar levels within range for the second part of the test.  Once your blood sugar is in acceptable range, you will be given a second imaging agent. Once this imaging agent is administered through your IV, we will have you wait in the room for about 60 minutes. You  can sleep, bring music to listen to/play on your phone, or watch TV while you wait. After the 60-minute wait we will bring you back to the PET/CT scanner and take a second set of images which will take approximately 10 minutes.  Please allow for 3 to 4 hours for the entire appointment.  After Your Cardiac Viability PET/CT: Drink plenty of fluids and eat a good meal within 2 hours of leaving the hospital. It is encouraged to urinate frequently for a couple of hours after your test.  Plan to eat a snack before bedtime. You will continue your diabetic medicines, including insulin, as prescribed after the test in finished. Your doctor will contact you with your test results in 7 to 10 business days.    For Questions About Your Test: Please call our Cardiac Imaging Nurse Navigators at [phone].  Rockwell Alexandria, Cardiac Imaging Nurse Navigator  Larey Brick, Cardiac Imaging Nurse Navigator       Special Instructions // Education:  Do the following things EVERYDAY: Weigh yourself in the morning before breakfast. Write it down and keep it in a log. Take your medicines as prescribed Eat low salt foods--Limit salt (sodium) to 2000 mg per day.  Stay as active as you can everyday Limit all fluids for the day to less than 2 liters   Follow-Up in: follow up after your ECHO.     If you have any questions or concerns before your next appointment please send [place] a message through [place] or call our office at [phone] Monday-Friday 8 am-5 pm.   If you have an urgent need after hours on the weekend please call your Primary Cardiologist or the [facility] in [place] at [phone].   At the [facility], you and your health needs are our priority. We have a designated team specialized in the treatment of Heart Failure. This Care Team includes your primary Heart Failure Specialized Cardiologist (physician), Advanced Practice Providers (APPs- Physician  Assistants and Nurse Practitioners), and Pharmacist who all work together to provide you with the care you need, when you need it.   You may see any of the following providers on your designated Care Team at your next follow up:  Dr. Arvilla Meres Dr. Marca Ancona Dr. Dorthula Nettles Dr. Theresia Bough Tonye Becket, NP Robbie Lis, [place] [address] [place], [place] Brynda Peon, NP [place] Lee, NP Clarisa Kindred, NP Enos Fling, PharmD

## 2023-10-25 NOTE — Progress Notes (Signed)
PCP: Corwin Levins, MD HF Cardiology: Dr. Shirlee Latch  77 y.o. with history of CAD, COPD, and HTN has been followed by Dr. Tenny Craw.  I see his wife here in [place], and he has asked to followup in this clinic in [place] for cardiology due to convenience. Patient reports that he had a heart attack in 1997 treated with angioplasty (records not available).  Most recent cath was in 2011 with mild nonobstructive CAD per report. He has a history of COPD and used to smoke.  More recently, he had gallstone pancreatitis in 8/24 requiring ERCP.  He had laparoscopic cholecystectomy in 8/24.    Patient reports increased dyspnea over the last year.  He is short of breath mowing the grass or raking.  He does not get short of breath just walking on flat ground.  He has episodes of sharp/stinging central chest pain.  This will last for < 30 seconds generally.  Episodes do not seem to be exertional.  This will happen every couple of weeks.  Episodes worry him as he thinks prior MI felt like this.   Labs (8/24): hgb 11, K 4.7, creatinine 0.86  ECG (personally reviewed): NSR, PACs  PMH: 1. HTN 2. COPD: Prior smoker 3. Hyperlipidemia 4. BPH 5. CAD: MI 1997 with angioplasty (per patient's report, do not have record).   - Cath 2011 with mild nonobstructive disease.  - Cardiolite (3/17): EF 59%, small fixed apical defect, low risk.  6. Low back pain/sciatica  7. OSA: Cannot tolerate CPAP.  8. Gallstone pancreatitis (8/24): Had ERCP then cholecystectomy.   SH: Married, prior smoker quit 1997, No ETOH.  Retired.   Family History  Problem Relation Age of Onset   Prostate cancer Father    Emphysema Father    Hyperlipidemia Father    Hyperlipidemia Mother    ROS: All systems reviewed and negative except as per HPI.   Current Outpatient Medications  Medication Sig Dispense Refill   albuterol (VENTOLIN HFA) 108 (90 Base) MCG/ACT inhaler INHALE 2 PUFFS INTO THE LUNGS 4 TIMES DAILY AS NEEDED (Patient taking  differently: Inhale 1 puff into the lungs every 6 (six) hours as needed for shortness of breath.) 8.5 g 11   amLODipine (NORVASC) 5 MG tablet Take 1 tablet (5 mg total) by mouth daily. 180 tablet 3   aspirin 81 MG tablet Take 81 mg by mouth daily.     blood glucose meter kit and supplies KIT Dispense based on patient and insurance preference. Use up to four times daily as directed. 1 each 0   cephALEXin (KEFLEX) 500 MG capsule Take 6 capsules (3,000 mg total) by mouth daily. Resume as you were taking this before unchanged dose once you have finished Augmentin course in 5 days.     furosemide (LASIX) 40 MG tablet 1 tab by mouth once per day as needed 90 tablet 3   omeprazole (PRILOSEC) 20 MG capsule TAKE ONE CAPSULE BY MOUTH TWICE A DAY (Patient taking differently: Take 20 mg by mouth daily.) 180 capsule 3   oxycodone (ROXICODONE) 30 MG immediate release tablet 1 tab by mouth 5 times per day (Patient taking differently: Take 15 mg by mouth See admin instructions. Take 1 tab by mouth 5 times per day per patient) 30 tablet 0   simvastatin (ZOCOR) 40 MG tablet TAKE 1 TABLET BY MOUTH AT BEDTIME 90 tablet 1   traZODone (DESYREL) 50 MG tablet Take 1 tablet (50 mg total) by mouth at bedtime as needed for sleep.  30 tablet 0   amoxicillin-clavulanate (AUGMENTIN) 875-125 MG tablet Take 1 tablet by mouth 2 (two) times daily. (Patient not taking: Reported on 10/25/2023) 10 tablet 0   citalopram (CELEXA) 20 MG tablet Take 1 tablet (20 mg total) by mouth daily. (Patient not taking: Reported on 10/25/2023) 90 tablet 3   oxyCODONE (OXY IR/ROXICODONE) 5 MG immediate release tablet Take 1 tablet (5 mg total) by mouth every 6 (six) hours as needed for breakthrough pain. (Patient not taking: Reported on 10/25/2023) 15 tablet 0   potassium chloride (KLOR-CON 10) 10 MEQ tablet 1 tab by mouth once daily when taking lasix (Patient not taking: Reported on 10/25/2023) 90 tablet 3   No current facility-administered medications  for this visit.   BP (!) 155/62   Pulse (!) 56   Wt 173 lb (78.5 kg)   SpO2 100%   BMI 24.82 kg/m  General: NAD Neck: No JVD, no thyromegaly or thyroid nodule.  Lungs: Clear to auscultation bilaterally with normal respiratory effort. CV: Nondisplaced PMI.  Heart regular S1/S2, no S3/S4, no murmur.  No peripheral edema.  No carotid bruit.  Normal pedal pulses.  Abdomen: Soft, nontender, no hepatosplenomegaly, no distention.  Skin: Intact without lesions or rashes.  Neurologic: Alert and oriented x 3.  Psych: Normal affect. Extremities: No clubbing or cyanosis.  HEENT: Normal.   Assessment/Plan: 1. CAD: Per report, he had MI in 1997 with angioplasty.  Last Cardiolite in 2017 showed small fixed apical defect.  Patient has atypical chest pain and exertional dyspnea.  He is not volume overloaded on exam, NYHA class II symptoms.  - I will arrange for cardiac PET to assess for ischemia.  - I will arrange for echo to assess LV/RV function given exertional dyspnea.  - Check BNP - Continue ASA 81 daily.  - Continue simvastatin, check lipids today.  2. HTN: BP has been running high.  - Increase amlodipine to 5 mg daily.  3. COPD: His dyspnea may be driven at least in part by COPD.   Followup in 2 months after testing.   Marca Ancona 10/25/2023

## 2023-10-26 LAB — BASIC METABOLIC PANEL WITH GFR
BUN/Creatinine Ratio: 13 (ref 10–24)
BUN: 12 mg/dL (ref 8–27)
CO2: 26 mmol/L (ref 20–29)
Calcium: 8.9 mg/dL (ref 8.6–10.2)
Chloride: 103 mmol/L (ref 96–106)
Creatinine, Ser: 0.89 mg/dL (ref 0.76–1.27)
Glucose: 103 mg/dL — ABNORMAL HIGH (ref 70–99)
Potassium: 4.5 mmol/L (ref 3.5–5.2)
Sodium: 143 mmol/L (ref 134–144)
eGFR: 88 mL/min/{1.73_m2}

## 2023-10-26 LAB — LIPID PANEL
Chol/HDL Ratio: 2.6 ratio (ref 0.0–5.0)
Cholesterol, Total: 144 mg/dL (ref 100–199)
HDL: 56 mg/dL
LDL Chol Calc (NIH): 70 mg/dL (ref 0–99)
Triglycerides: 95 mg/dL (ref 0–149)
VLDL Cholesterol Cal: 18 mg/dL (ref 5–40)

## 2023-10-26 LAB — BRAIN NATRIURETIC PEPTIDE: BNP: 148.7 pg/mL — ABNORMAL HIGH (ref 0.0–100.0)

## 2023-11-03 ENCOUNTER — Encounter (HOSPITAL_COMMUNITY): Payer: Self-pay

## 2023-11-06 ENCOUNTER — Telehealth (HOSPITAL_COMMUNITY): Payer: Self-pay | Admitting: *Deleted

## 2023-11-06 NOTE — Telephone Encounter (Signed)
Attempted to call patient regarding upcoming cardiac PET appointment. Left message on voicemail with name and callback number  Valeria Krisko RN Navigator Cardiac Imaging [facility] [phone] Office [phone] Cell  Reminder to avoid caffeine 12 hours prior to his cardiac PET scan. 

## 2023-11-07 ENCOUNTER — Telehealth (HOSPITAL_COMMUNITY): Payer: Self-pay | Admitting: Emergency Medicine

## 2023-11-07 NOTE — Telephone Encounter (Signed)
Attempted to call patient regarding upcoming cardiac PET appointment. Left message on voicemail with name and callback number Aidan Moten RN Navigator Cardiac Imaging [facility] [phone] Office [phone] Cell  

## 2023-11-08 ENCOUNTER — Encounter (HOSPITAL_COMMUNITY): Admission: RE | Admit: 2023-11-08 | Payer: Medicare HMO | Source: Ambulatory Visit

## 2023-11-21 ENCOUNTER — Encounter: Payer: Medicare HMO | Admitting: Cardiology

## 2023-11-22 ENCOUNTER — Ambulatory Visit
Admission: RE | Admit: 2023-11-22 | Discharge: 2023-11-22 | Disposition: A | Discharge status: Home / Self Care | Payer: Medicare HMO | Source: Ambulatory Visit | Attending: Cardiology | Admitting: Cardiology

## 2023-11-22 DIAGNOSIS — I110 Hypertensive heart disease with heart failure: Secondary | ICD-10-CM | POA: Diagnosis present

## 2023-11-22 DIAGNOSIS — I5032 Chronic diastolic (congestive) heart failure: Secondary | ICD-10-CM | POA: Insufficient documentation

## 2023-11-22 DIAGNOSIS — J449 Chronic obstructive pulmonary disease, unspecified: Secondary | ICD-10-CM | POA: Insufficient documentation

## 2023-11-22 DIAGNOSIS — F419 Anxiety disorder, unspecified: Secondary | ICD-10-CM | POA: Diagnosis not present

## 2023-11-22 LAB — ECHOCARDIOGRAM COMPLETE
AR max vel: 2.06 cm2
AV Area VTI: 2.04 cm2
AV Area mean vel: 1.86 cm2
AV Mean grad: 3 mmHg
AV Peak grad: 5.7 mmHg
Ao pk vel: 1.19 m/s
Area-P 1/2: 3.48 cm2
MV VTI: 1.47 cm2
S' Lateral: 3.1 cm

## 2023-12-05 ENCOUNTER — Ambulatory Visit: Payer: Medicare Other | Admitting: Internal Medicine

## 2023-12-11 ENCOUNTER — Telehealth (HOSPITAL_COMMUNITY): Payer: Self-pay | Admitting: Emergency Medicine

## 2023-12-11 NOTE — Telephone Encounter (Signed)
Pt wife states she was just sent home from the hosp with Oxygen, cannot  be left alone for long period of time. Therefore wish to r/s his appt for cardiac pet.  Rockwell Alexandria RN Navigator Cardiac Imaging [facility] [phone] Office  [phone] Cell

## 2023-12-12 ENCOUNTER — Ambulatory Visit (HOSPITAL_COMMUNITY): Payer: Medicare HMO

## 2024-01-10 ENCOUNTER — Ambulatory Visit: Payer: Medicare Other | Admitting: Dermatology

## 2024-01-12 ENCOUNTER — Telehealth: Payer: Self-pay

## 2024-01-15 ENCOUNTER — Encounter (HOSPITAL_COMMUNITY): Payer: Self-pay

## 2024-01-15 ENCOUNTER — Telehealth (HOSPITAL_COMMUNITY): Payer: Self-pay | Admitting: Emergency Medicine

## 2024-01-15 NOTE — Telephone Encounter (Signed)
Reaching out to patient to offer assistance regarding upcoming cardiac imaging study; pt verbalizes understanding of appt date/time, parking situation and where to check in, pre-test NPO status and medications ordered, and verified current allergies; name and call back number provided for further questions should they arise Cayne Yom RN Navigator Cardiac Imaging [facility] [phone] office [phone] cell 

## 2024-01-16 ENCOUNTER — Ambulatory Visit (HOSPITAL_COMMUNITY)
Admission: RE | Admit: 2024-01-16 | Discharge: 2024-01-16 | Disposition: A | Discharge status: Home / Self Care | Payer: Medicare Other | Source: Ambulatory Visit | Attending: Cardiology | Admitting: Cardiology

## 2024-01-16 DIAGNOSIS — I5032 Chronic diastolic (congestive) heart failure: Secondary | ICD-10-CM | POA: Diagnosis present

## 2024-01-16 MED ORDER — RUBIDIUM RB82 GENERATOR (RUBYFILL)
20.4300 | PACK | Freq: Once | INTRAVENOUS | Status: AC
  Administered 2024-01-16: 20.38 via INTRAVENOUS

## 2024-01-16 MED ORDER — REGADENOSON 0.4 MG/5ML IV SOLN
0.4000 mg | Freq: Once | INTRAVENOUS | Status: AC
  Administered 2024-01-16: 0.4 mg via INTRAVENOUS

## 2024-01-16 MED ORDER — RUBIDIUM RB82 GENERATOR (RUBYFILL)
25.0000 | PACK | Freq: Once | INTRAVENOUS | Status: AC
  Administered 2024-01-16: 20.43 via INTRAVENOUS

## 2024-01-16 MED ORDER — REGADENOSON 0.4 MG/5ML IV SOLN
INTRAVENOUS | Status: AC
  Filled 2024-01-16: qty 5

## 2024-01-17 LAB — NM PET CT CARDIAC PERFUSION MULTI W/ABSOLUTE BLOODFLOW
LV dias vol: 98 mL (ref 62–150)
LV sys vol: 46 mL
MBFR: 1.36
Nuc Rest EF: 53 %
Nuc Stress EF: 56 %
Peak HR: 71 {beats}/min
Rest HR: 56 {beats}/min
Rest MBF: 0.98 ml/g/min
Rest Nuclear Isotope Dose: 20.4 mCi
ST Depression (mm): 0 mm
Stress MBF: 1.33 ml/g/min
Stress Nuclear Isotope Dose: 20.4 mCi

## 2024-01-18 ENCOUNTER — Other Ambulatory Visit: Payer: Self-pay | Admitting: Internal Medicine

## 2024-01-22 ENCOUNTER — Telehealth: Payer: Self-pay

## 2024-01-22 NOTE — Telephone Encounter (Signed)
-----   Message from Nurse Herbert Seta S sent at 01/22/2024 12:08 PM EST -----  ----- Message ----- From: Chrystine Oiler, CMA Sent: 01/22/2024   9:22 AM EST To: Noralee Space, RN   ----- Message ----- From: Laurey Morale, MD Sent: 01/17/2024   2:36 PM EST To: [facility] Triage  Cardiac PET showed no perfusion defects, so no regional decrease in blood flow.  However, there was globally abnormal myocardial blood flow reserve, which could be seen with severe 3 vessel disease and balanced ischemia or just small vessel disease/microvascular dysfunction.  I think we will need further investigation.  If he is not having chest pain with exertion, would suggest a coronary CTA.  If he is having significant chest pain, would proceed with cath.

## 2024-01-22 NOTE — Telephone Encounter (Signed)
Called and spoke with patient and patients wife- made them aware of the below results.   Per patients wife patient is having intermittent chest pain with exertion/upper back pain at time. Denies any pain at present.   Advised that further testing would be indicated- offered appointment tomorrow with HF here at [facility]- they would prefer to come to [place] to see Dr. Shirlee Latch   Added patient on to Wednesday 1/29 at 820 with Dr. Shirlee Latch.   Made patient and patients wife  aware of ED precautions should new or worsening symptoms develop. Patient and patients wife verbalized understanding.   Advised patient to call back to office with any issues, questions, or concerns. Patient verbalized understanding.

## 2024-01-24 ENCOUNTER — Other Ambulatory Visit (HOSPITAL_COMMUNITY): Payer: Self-pay

## 2024-01-24 ENCOUNTER — Encounter (HOSPITAL_COMMUNITY): Payer: Self-pay | Admitting: Cardiology

## 2024-01-24 ENCOUNTER — Ambulatory Visit (HOSPITAL_COMMUNITY)
Admission: RE | Admit: 2024-01-24 | Discharge: 2024-01-24 | Disposition: A | Discharge status: Home / Self Care | Payer: Medicare Other | Source: Ambulatory Visit | Attending: Cardiology | Admitting: Cardiology

## 2024-01-24 ENCOUNTER — Inpatient Hospital Stay (HOSPITAL_COMMUNITY)
Admission: RE | Admit: 2024-01-24 | Discharge: 2024-01-24 | Disposition: A | Discharge status: Home / Self Care | Payer: Medicare Other | Source: Ambulatory Visit | Attending: Cardiology | Admitting: Cardiology

## 2024-01-24 ENCOUNTER — Other Ambulatory Visit (HOSPITAL_COMMUNITY): Payer: Self-pay | Admitting: Cardiology

## 2024-01-24 VITALS — BP 120/60 | HR 75 | Wt 176.8 lb

## 2024-01-24 DIAGNOSIS — I2510 Atherosclerotic heart disease of native coronary artery without angina pectoris: Secondary | ICD-10-CM | POA: Insufficient documentation

## 2024-01-24 DIAGNOSIS — I252 Old myocardial infarction: Secondary | ICD-10-CM | POA: Diagnosis not present

## 2024-01-24 DIAGNOSIS — R9431 Abnormal electrocardiogram [ECG] [EKG]: Secondary | ICD-10-CM | POA: Diagnosis not present

## 2024-01-24 DIAGNOSIS — Z9862 Peripheral vascular angioplasty status: Secondary | ICD-10-CM | POA: Insufficient documentation

## 2024-01-24 DIAGNOSIS — I493 Ventricular premature depolarization: Secondary | ICD-10-CM

## 2024-01-24 DIAGNOSIS — R079 Chest pain, unspecified: Secondary | ICD-10-CM | POA: Diagnosis present

## 2024-01-24 DIAGNOSIS — Z87891 Personal history of nicotine dependence: Secondary | ICD-10-CM | POA: Diagnosis not present

## 2024-01-24 DIAGNOSIS — R002 Palpitations: Secondary | ICD-10-CM | POA: Insufficient documentation

## 2024-01-24 DIAGNOSIS — R0609 Other forms of dyspnea: Secondary | ICD-10-CM | POA: Diagnosis present

## 2024-01-24 DIAGNOSIS — I5032 Chronic diastolic (congestive) heart failure: Secondary | ICD-10-CM | POA: Insufficient documentation

## 2024-01-24 DIAGNOSIS — J449 Chronic obstructive pulmonary disease, unspecified: Secondary | ICD-10-CM | POA: Insufficient documentation

## 2024-01-24 DIAGNOSIS — I110 Hypertensive heart disease with heart failure: Secondary | ICD-10-CM | POA: Insufficient documentation

## 2024-01-24 LAB — CBC
HCT: 39.6 % (ref 39.0–52.0)
Hemoglobin: 12.9 g/dL — ABNORMAL LOW (ref 13.0–17.0)
MCH: 27 pg (ref 26.0–34.0)
MCHC: 32.6 g/dL (ref 30.0–36.0)
MCV: 83 fL (ref 80.0–100.0)
Platelets: 244 10*3/uL (ref 150–400)
RBC: 4.77 MIL/uL (ref 4.22–5.81)
RDW: 13.5 % (ref 11.5–15.5)
WBC: 6.3 10*3/uL (ref 4.0–10.5)
nRBC: 0 % (ref 0.0–0.2)

## 2024-01-24 LAB — BASIC METABOLIC PANEL WITH GFR
Anion gap: 9 (ref 5–15)
BUN: 11 mg/dL (ref 8–23)
CO2: 32 mmol/L (ref 22–32)
Calcium: 9.2 mg/dL (ref 8.9–10.3)
Chloride: 100 mmol/L (ref 98–111)
Creatinine, Ser: 1.07 mg/dL (ref 0.61–1.24)
GFR, Estimated: >60 mL/min
Glucose, Bld: 113 mg/dL — ABNORMAL HIGH (ref 70–99)
Potassium: 4.5 mmol/L (ref 3.5–5.1)
Sodium: 141 mmol/L (ref 135–145)

## 2024-01-24 MED ORDER — ROSUVASTATIN CALCIUM 20 MG PO TABS: 20.0000 mg | ORAL_TABLET | Freq: Every day | ORAL | 3 refills | Status: DC

## 2024-01-24 NOTE — Progress Notes (Signed)
PCP: Corwin Levins, MD HF Cardiology: Dr. Shirlee Latch  Chief complaint: Dyspnea, chest pain  78 y.o. with history of CAD, COPD, and HTN has been followed by Dr. Tenny Craw.  Patient reports that he had a heart attack in 1997 treated with angioplasty (records not available).  He had a cardiac cath in 2011 with mild nonobstructive CAD per report. He has a history of COPD and used to smoke.  More recently, he had gallstone pancreatitis in 8/24 requiring ERCP.  He had laparoscopic cholecystectomy in 8/24.    At visit with me in 10/24, he reported increased exertional dyspnea as well as atypical chest pain. Echo was done in 11/24 showing EF 55%, normal RV, mild MR, normal IVC.  Cardiac PET in 1/25 showed EF 53%, no ischemia/infarction but there was significantly abnormal myocardial flow reserve suggesting either balanced ischemia or microvascular dysfunction.   Patient returns for followup of dyspnea and chest pain.  He continues to get short episodes of central chest pain lasting generally < 1 minute.  Episodes are not clearly exertional but make him anxious. They will occur several times/week. He also gets pain in his upper back generally with exertion.  He is short of breath walking longer distances or doing moderate activity. No orthopnea/PND.  No lightheadedness or palpitations. He notes frequent palpitations and has frequent PACs on echo today.  He is very worried about his heart.   Labs (8/24): hgb 11, K 4.7, creatinine 0.86 Labs (10/24): LDL 70, BNP 149, K 4.5, creatinine 0.89  ECG (personally reviewed): NSR with frequent PACs  PMH: 1. HTN 2. COPD: Prior smoker 3. Hyperlipidemia 4. BPH 5. CAD: MI 1997 with angioplasty (per patient's report, do not have record).   - Cath 2011 with mild nonobstructive disease.  - Cardiolite (3/17): EF 59%, small fixed apical defect, low risk.  - Echo (11/24): EF 55%, normal RV, mild MR, normal IVC - Cardiac PET (1/25): EF 53%, no ischemia/infarction but there was  abnormal myocardial flow reserve suggesting either balanced ischemia or microvascular dysfunction.  6. Low back pain/sciatica  7. OSA: Cannot tolerate CPAP.  8. Gallstone pancreatitis (8/24): Had ERCP then cholecystectomy.   SH: Married, prior smoker quit 1997, No ETOH.  Retired.   Family History  Problem Relation Age of Onset   Prostate cancer Father    Emphysema Father    Hyperlipidemia Father    Hyperlipidemia Mother    ROS: All systems reviewed and negative except as per HPI.   Current Outpatient Medications  Medication Sig Dispense Refill   albuterol (VENTOLIN HFA) 108 (90 Base) MCG/ACT inhaler INHALE 2 PUFFS INTO THE LUNGS 4 TIMES DAILY AS NEEDED 8.5 g 11   amLODipine (NORVASC) 5 MG tablet Take 1 tablet (5 mg total) by mouth daily. 180 tablet 3   aspirin 81 MG tablet Take 81 mg by mouth daily.     blood glucose meter kit and supplies KIT Dispense based on patient and insurance preference. Use up to four times daily as directed. 1 each 0   furosemide (LASIX) 40 MG tablet 1 tab by mouth once per day as needed 90 tablet 3   omeprazole (PRILOSEC) 20 MG capsule TAKE ONE CAPSULE BY MOUTH TWICE A DAY 180 capsule 3   oxycodone (ROXICODONE) 30 MG immediate release tablet 1 tab by mouth 5 times per day 30 tablet 0   potassium chloride (KLOR-CON 10) 10 MEQ tablet 1 tab by mouth once daily when taking lasix 90 tablet 3   rosuvastatin (  CRESTOR) 20 MG tablet Take 1 tablet (20 mg total) by mouth daily. 90 tablet 3   traZODone (DESYREL) 50 MG tablet Take 1 tablet (50 mg total) by mouth at bedtime as needed for sleep. 30 tablet 0   No current facility-administered medications for this encounter.   BP 120/60   Pulse 75   Wt 80.2 kg (176 lb 12.8 oz)   SpO2 93%   BMI 25.37 kg/m  General: NAD Neck: No JVD, no thyromegaly or thyroid nodule.  Lungs: Clear to auscultation bilaterally with normal respiratory effort. CV: Nondisplaced PMI.  Heart regular with PACs, no S3/S4, no murmur.  No  peripheral edema.  No carotid bruit.  Normal pedal pulses.  Abdomen: Soft, nontender, no hepatosplenomegaly, no distention.  Skin: Intact without lesions or rashes.  Neurologic: Alert and oriented x 3.  Psych: Normal affect. Extremities: No clubbing or cyanosis.  HEENT: Normal.   Assessment/Plan: 1. CAD: Per report, he had MI in 1997 with angioplasty.  Cardiac PET in 1/25 showed  EF 53%, no ischemia/infarction but there was abnormal myocardial flow reserve suggesting either balanced ischemia or microvascular dysfunction. Patient continues to have episodes of primarily atypical chest pain but also exertional upper back pain and dyspnea. I think that we will need further investigation of his symptoms as the cardiac PET was not normal.   - We discussed evaluation by coronary CTA versus cardiac cath to investigate symptoms and abnormal PET.  He is very nervous about his heart and wishes to proceed with the definitive study.  We discussed risks/benefits and he agrees to cardiac catheterization/possible PCI.  - Continue ASA 81 daily.  - Goal LDL < 70, will stop simvastatin and start rosuvastatin 20 mg daily.  Lipids/LFTs in 2 months.   2. HTN: BP controlled.  - Continue amlodipine.  3. COPD: His dyspnea may be driven at least in part by COPD.  4. Palpitations: PACs noted on ECG.   - I will arrange for 7 day Zio monitor to make sure he is not having atrial fibrillation runs.   I spent 41 minutes reviewing chart, interacting with patient, discussing procedure, and managing orders.   Marca Ancona 01/24/2024

## 2024-01-24 NOTE — Patient Instructions (Addendum)
STOP Zocor   START Crestor 20 mg daily.  Labs done today, your results will be available in MyChart, we will contact you for abnormal readings.  Repeat blood work in 2 months at [facility]  Your provider has recommended that  you wear a Zio Patch for 7 days.  This monitor will record your heart rhythm for our review.  IF you have any symptoms while wearing the monitor please press the button.  If you have any issues with the patch or you notice a red or orange light on it please call the company at [phone].  Once you remove the patch please mail it back to the company as soon as possible so we can get the results.   You are scheduled for a Cardiac Catheterization on Tuesday, February 4 with Dr. Marca Ancona.  1. Please arrive at the [facility] (Main Entrance A) at [facility]: [address] [place], [place] [address] at 5:30 AM (This time is 2 hour(s) before your procedure to ensure your preparation).   Free valet parking service is available. You will check in at ADMITTING. The support person will be asked to wait in the waiting room.  It is OK to have someone drop you off and come back when you are ready to be discharged.    Special note: Every effort is made to have your procedure done on time. Please understand that emergencies sometimes delay scheduled procedures.  2. Diet: Do not eat solid foods after midnight.  The patient may have clear liquids until 5am upon the day of the procedure.  3. Medication instructions in preparation for your procedure:   Contrast Allergy: No  HOLD YOUR LASIX THE MORNING OF YOUR PROCEDURE   On the morning of your procedure, take any morning medicines NOT listed above.  You may use sips of water.  5. Plan to go home the same day, you will only stay overnight if medically necessary. 6. Bring a current list of your medications and current insurance cards. 7. You MUST have a responsible person to drive you home. 8. Someone MUST be with you  the first 24 hours after you arrive home or your discharge will be delayed. 9. Please wear clothes that are easy to get on and off and wear slip-on shoes.   Your physician recommends that you schedule a follow-up appointment in: 4 WEEKS   If you have any questions or concerns before your next appointment please send [place] a message through [place] or call our office at [phone].    TO LEAVE A MESSAGE FOR THE NURSE SELECT OPTION 2, PLEASE LEAVE A MESSAGE INCLUDING: YOUR NAME DATE OF BIRTH CALL BACK NUMBER REASON FOR CALL**this is important as we prioritize the call backs  YOU WILL RECEIVE A CALL BACK THE SAME DAY AS LONG AS YOU CALL BEFORE 4:00 PM  At the [facility], you and your health needs are our priority. As part of our continuing mission to provide you with exceptional heart care, we have created designated Provider Care Teams. These Care Teams include your primary Cardiologist (physician) and Advanced Practice Providers (APPs- Physician Assistants and Nurse Practitioners) who all work together to provide you with the care you need, when you need it.   You may see any of the following providers on your designated Care Team at your next follow up: Dr Arvilla Meres Dr Marca Ancona Dr. Dorthula Nettles Dr. Clearnce Hasten Tonye Becket, NP Robbie Lis, [place] Valrie Hart  [place], [place] Brynda Peon, NP [place] Lee, NP Karle Plumber, PharmD   Please be sure to bring in all your medications bottles to every appointment.    Thank you for choosing [facility]

## 2024-01-24 NOTE — H&P (View-Only) (Signed)
 PCP: Corwin Levins, MD HF Cardiology: Dr. Shirlee Latch  Chief complaint: Dyspnea, chest pain  78 y.o. with history of CAD, COPD, and HTN has been followed by Dr. Tenny Craw.  Patient reports that he had a heart attack in 1997 treated with angioplasty (records not available).  He had a cardiac cath in 2011 with mild nonobstructive CAD per report. He has a history of COPD and used to smoke.  More recently, he had gallstone pancreatitis in 8/24 requiring ERCP.  He had laparoscopic cholecystectomy in 8/24.    At visit with me in 10/24, he reported increased exertional dyspnea as well as atypical chest pain. Echo was done in 11/24 showing EF 55%, normal RV, mild MR, normal IVC.  Cardiac PET in 1/25 showed EF 53%, no ischemia/infarction but there was significantly abnormal myocardial flow reserve suggesting either balanced ischemia or microvascular dysfunction.   Patient returns for followup of dyspnea and chest pain.  He continues to get short episodes of central chest pain lasting generally < 1 minute.  Episodes are not clearly exertional but make him anxious. They will occur several times/week. He also gets pain in his upper back generally with exertion.  He is short of breath walking longer distances or doing moderate activity. No orthopnea/PND.  No lightheadedness or palpitations. He notes frequent palpitations and has frequent PACs on echo today.  He is very worried about his heart.   Labs (8/24): hgb 11, K 4.7, creatinine 0.86 Labs (10/24): LDL 70, BNP 149, K 4.5, creatinine 0.89  ECG (personally reviewed): NSR with frequent PACs  PMH: 1. HTN 2. COPD: Prior smoker 3. Hyperlipidemia 4. BPH 5. CAD: MI 1997 with angioplasty (per patient's report, do not have record).   - Cath 2011 with mild nonobstructive disease.  - Cardiolite (3/17): EF 59%, small fixed apical defect, low risk.  - Echo (11/24): EF 55%, normal RV, mild MR, normal IVC - Cardiac PET (1/25): EF 53%, no ischemia/infarction but there was  abnormal myocardial flow reserve suggesting either balanced ischemia or microvascular dysfunction.  6. Low back pain/sciatica  7. OSA: Cannot tolerate CPAP.  8. Gallstone pancreatitis (8/24): Had ERCP then cholecystectomy.   SH: Married, prior smoker quit 1997, No ETOH.  Retired.   Family History  Problem Relation Age of Onset   Prostate cancer Father    Emphysema Father    Hyperlipidemia Father    Hyperlipidemia Mother    ROS: All systems reviewed and negative except as per HPI.   Current Outpatient Medications  Medication Sig Dispense Refill   albuterol (VENTOLIN HFA) 108 (90 Base) MCG/ACT inhaler INHALE 2 PUFFS INTO THE LUNGS 4 TIMES DAILY AS NEEDED 8.5 g 11   amLODipine (NORVASC) 5 MG tablet Take 1 tablet (5 mg total) by mouth daily. 180 tablet 3   aspirin 81 MG tablet Take 81 mg by mouth daily.     blood glucose meter kit and supplies KIT Dispense based on patient and insurance preference. Use up to four times daily as directed. 1 each 0   furosemide (LASIX) 40 MG tablet 1 tab by mouth once per day as needed 90 tablet 3   omeprazole (PRILOSEC) 20 MG capsule TAKE ONE CAPSULE BY MOUTH TWICE A DAY 180 capsule 3   oxycodone (ROXICODONE) 30 MG immediate release tablet 1 tab by mouth 5 times per day 30 tablet 0   potassium chloride (KLOR-CON 10) 10 MEQ tablet 1 tab by mouth once daily when taking lasix 90 tablet 3   rosuvastatin (  CRESTOR) 20 MG tablet Take 1 tablet (20 mg total) by mouth daily. 90 tablet 3   traZODone (DESYREL) 50 MG tablet Take 1 tablet (50 mg total) by mouth at bedtime as needed for sleep. 30 tablet 0   No current facility-administered medications for this encounter.   BP 120/60   Pulse 75   Wt 80.2 kg (176 lb 12.8 oz)   SpO2 93%   BMI 25.37 kg/m  General: NAD Neck: No JVD, no thyromegaly or thyroid nodule.  Lungs: Clear to auscultation bilaterally with normal respiratory effort. CV: Nondisplaced PMI.  Heart regular with PACs, no S3/S4, no murmur.  No  peripheral edema.  No carotid bruit.  Normal pedal pulses.  Abdomen: Soft, nontender, no hepatosplenomegaly, no distention.  Skin: Intact without lesions or rashes.  Neurologic: Alert and oriented x 3.  Psych: Normal affect. Extremities: No clubbing or cyanosis.  HEENT: Normal.   Assessment/Plan: 1. CAD: Per report, he had MI in 1997 with angioplasty.  Cardiac PET in 1/25 showed  EF 53%, no ischemia/infarction but there was abnormal myocardial flow reserve suggesting either balanced ischemia or microvascular dysfunction. Patient continues to have episodes of primarily atypical chest pain but also exertional upper back pain and dyspnea. I think that we will need further investigation of his symptoms as the cardiac PET was not normal.   - We discussed evaluation by coronary CTA versus cardiac cath to investigate symptoms and abnormal PET.  He is very nervous about his heart and wishes to proceed with the definitive study.  We discussed risks/benefits and he agrees to cardiac catheterization/possible PCI.  - Continue ASA 81 daily.  - Goal LDL < 70, will stop simvastatin and start rosuvastatin 20 mg daily.  Lipids/LFTs in 2 months.   2. HTN: BP controlled.  - Continue amlodipine.  3. COPD: His dyspnea may be driven at least in part by COPD.  4. Palpitations: PACs noted on ECG.   - I will arrange for 7 day Zio monitor to make sure he is not having atrial fibrillation runs.   I spent 41 minutes reviewing chart, interacting with patient, discussing procedure, and managing orders.   Marca Ancona 01/24/2024

## 2024-01-25 ENCOUNTER — Ambulatory Visit: Payer: Medicare Other | Admitting: Dermatology

## 2024-01-30 ENCOUNTER — Ambulatory Visit (HOSPITAL_COMMUNITY)
Admission: RE | Admit: 2024-01-30 | Discharge: 2024-01-30 | Disposition: A | Discharge status: Home / Self Care | Payer: Medicare Other | Attending: Cardiology | Admitting: Cardiology

## 2024-01-30 ENCOUNTER — Encounter (HOSPITAL_COMMUNITY)
Admission: RE | Disposition: A | Discharge status: Home / Self Care | Payer: Self-pay | Source: Home / Self Care | Attending: Cardiology

## 2024-01-30 ENCOUNTER — Other Ambulatory Visit: Payer: Self-pay

## 2024-01-30 DIAGNOSIS — I5032 Chronic diastolic (congestive) heart failure: Secondary | ICD-10-CM

## 2024-01-30 DIAGNOSIS — I2510 Atherosclerotic heart disease of native coronary artery without angina pectoris: Secondary | ICD-10-CM

## 2024-01-30 DIAGNOSIS — R002 Palpitations: Secondary | ICD-10-CM | POA: Diagnosis not present

## 2024-01-30 DIAGNOSIS — I252 Old myocardial infarction: Secondary | ICD-10-CM | POA: Diagnosis not present

## 2024-01-30 DIAGNOSIS — J449 Chronic obstructive pulmonary disease, unspecified: Secondary | ICD-10-CM | POA: Insufficient documentation

## 2024-01-30 DIAGNOSIS — I10 Essential (primary) hypertension: Secondary | ICD-10-CM | POA: Insufficient documentation

## 2024-01-30 DIAGNOSIS — Z87891 Personal history of nicotine dependence: Secondary | ICD-10-CM | POA: Diagnosis not present

## 2024-01-30 DIAGNOSIS — Z79899 Other long term (current) drug therapy: Secondary | ICD-10-CM | POA: Diagnosis not present

## 2024-01-30 DIAGNOSIS — Z7982 Long term (current) use of aspirin: Secondary | ICD-10-CM | POA: Insufficient documentation

## 2024-01-30 DIAGNOSIS — R931 Abnormal findings on diagnostic imaging of heart and coronary circulation: Secondary | ICD-10-CM | POA: Diagnosis present

## 2024-01-30 HISTORY — PX: LEFT HEART CATH AND CORONARY ANGIOGRAPHY: CATH118249

## 2024-01-30 SURGERY — LEFT HEART CATH AND CORONARY ANGIOGRAPHY
Anesthesia: LOCAL

## 2024-01-30 MED ORDER — HYDRALAZINE HCL 20 MG/ML IJ SOLN: 10.0000 mg | INTRAMUSCULAR | Status: DC | PRN

## 2024-01-30 MED ORDER — MIDAZOLAM HCL 2 MG/2ML IJ SOLN
INTRAMUSCULAR | Status: AC
  Filled 2024-01-30: qty 2

## 2024-01-30 MED ORDER — SODIUM CHLORIDE 0.9 % IV SOLN: INTRAVENOUS | Status: DC

## 2024-01-30 MED ORDER — VERAPAMIL HCL 2.5 MG/ML IV SOLN
INTRAVENOUS | Status: AC
  Filled 2024-01-30: qty 2

## 2024-01-30 MED ORDER — MIDAZOLAM HCL 2 MG/2ML IJ SOLN
INTRAMUSCULAR | Status: DC | PRN
  Administered 2024-01-30 (×2): 1 mg via INTRAVENOUS

## 2024-01-30 MED ORDER — SODIUM CHLORIDE 0.9 % IV SOLN: INTRAVENOUS | Status: AC

## 2024-01-30 MED ORDER — HEPARIN SODIUM (PORCINE) 1000 UNIT/ML IJ SOLN
INTRAMUSCULAR | Status: AC
  Filled 2024-01-30: qty 10

## 2024-01-30 MED ORDER — ONDANSETRON HCL 4 MG/2ML IJ SOLN: 4.0000 mg | Freq: Four times a day (QID) | INTRAMUSCULAR | Status: DC | PRN

## 2024-01-30 MED ORDER — HEPARIN (PORCINE) IN NACL 1000-0.9 UT/500ML-% IV SOLN
INTRAVENOUS | Status: DC | PRN
  Administered 2024-01-30: 1000 mL

## 2024-01-30 MED ORDER — SODIUM CHLORIDE 0.9 % IV SOLN: 250.0000 mL | INTRAVENOUS | Status: DC | PRN

## 2024-01-30 MED ORDER — FENTANYL CITRATE (PF) 100 MCG/2ML IJ SOLN
INTRAMUSCULAR | Status: AC
  Filled 2024-01-30: qty 2

## 2024-01-30 MED ORDER — ACETAMINOPHEN 325 MG PO TABS: 650.0000 mg | ORAL_TABLET | ORAL | Status: DC | PRN

## 2024-01-30 MED ORDER — IOHEXOL 350 MG/ML SOLN
INTRAVENOUS | Status: DC | PRN
  Administered 2024-01-30: 45 mL

## 2024-01-30 MED ORDER — LIDOCAINE HCL (PF) 1 % IJ SOLN
INTRAMUSCULAR | Status: AC
  Filled 2024-01-30: qty 30

## 2024-01-30 MED ORDER — FENTANYL CITRATE (PF) 100 MCG/2ML IJ SOLN
INTRAMUSCULAR | Status: DC | PRN
  Administered 2024-01-30 (×2): 25 ug via INTRAVENOUS

## 2024-01-30 MED ORDER — LIDOCAINE HCL (PF) 1 % IJ SOLN
INTRAMUSCULAR | Status: DC | PRN
  Administered 2024-01-30: 5 mL
  Administered 2024-01-30: 2 mL

## 2024-01-30 MED ORDER — SODIUM CHLORIDE 0.9% FLUSH: 3.0000 mL | Freq: Two times a day (BID) | INTRAVENOUS | Status: DC

## 2024-01-30 MED ORDER — ASPIRIN 81 MG PO CHEW
81.0000 mg | CHEWABLE_TABLET | Freq: Once | ORAL | Status: AC
  Administered 2024-01-30: 81 mg via ORAL
  Filled 2024-01-30: qty 1

## 2024-01-30 MED ORDER — LABETALOL HCL 5 MG/ML IV SOLN: 10.0000 mg | INTRAVENOUS | Status: DC | PRN

## 2024-01-30 MED ORDER — SODIUM CHLORIDE 0.9% FLUSH: 3.0000 mL | INTRAVENOUS | Status: DC | PRN

## 2024-01-30 SURGICAL SUPPLY — 9 items
CATH INFINITI 5FR MULTPACK ANG (CATHETERS) ×1
GLIDESHEATH SLEND SS 6F .021 (SHEATH) ×1
INQWIRE 1.5J .035X260CM (WIRE) ×1
KIT HEART LEFT (KITS) ×1
PACK CARDIAC CATHETERIZATION (CUSTOM PROCEDURE TRAY) ×1
SHEATH PINNACLE 5F 10CM (SHEATH) ×1
TRANSDUCER W/STOPCOCK (MISCELLANEOUS) ×1
WIRE MICRO SET SILHO 5FR 7 (SHEATH) ×1
WIRE MICROINTRODUCER 60CM (WIRE) ×1

## 2024-01-30 NOTE — Interval H&P Note (Signed)
 History and Physical Interval Note:  01/30/2024 7:49 AM  Albert Wade  has presented today for surgery, with the diagnosis of abn scan.  The various methods of treatment have been discussed with the patient and family. After consideration of risks, benefits and other options for treatment, the patient has consented to  Procedure(s): LEFT HEART CATH AND CORONARY ANGIOGRAPHY (N/A) as a surgical intervention.  The patient's history has been reviewed, patient examined, no change in status, stable for surgery.  I have reviewed the patient's chart and labs.  Questions were answered to the patient's satisfaction.     Maeli Spacek [employer]

## 2024-01-30 NOTE — Progress Notes (Signed)
 SITE AREA:right groin/femoral  SITE PRIOR TO REMOVAL:  LEVEL 0  PRESSURE APPLIED FOR: approximately 30 minutes  MANUAL: yes, sheath removed by Geoffry, RN  PATIENT STATUS DURING PULL: stable  POST PULL SITE:  LEVEL 0  POST PULL INSTRUCTIONS GIVEN: yes, drsg x 24 hours, may shower tomorrow, no running, jumping, climb easy on stairs and into and out of vehicles, no sitting in water x 1 week, no hot tubs, bath tubs, or pools  POST PULL PULSES PRESENT: bilateral pedal pulses at +2  DRESSING APPLIED: gauze with tegaderm  BEDREST BEGINS @ 0945  COMMENTS:

## 2024-01-31 ENCOUNTER — Encounter (HOSPITAL_COMMUNITY): Payer: Self-pay | Admitting: Cardiology

## 2024-02-01 ENCOUNTER — Encounter: Payer: Medicare Other | Admitting: Cardiology

## 2024-02-02 DIAGNOSIS — I493 Ventricular premature depolarization: Secondary | ICD-10-CM | POA: Diagnosis not present

## 2024-02-12 ENCOUNTER — Telehealth: Payer: Self-pay

## 2024-02-12 MED ORDER — METOPROLOL SUCCINATE ER 50 MG PO TB24: 25.0000 mg | ORAL_TABLET | Freq: Every day | ORAL | 3 refills | Status: DC

## 2024-02-12 NOTE — Telephone Encounter (Signed)
-----   Message from Marca Ancona sent at 02/11/2024  6:38 PM EST ----- Frequent PACs and multiple short SVT runs.  Suspect palpitations are caused by SVT and PACs, recommend starting Toprol XL 25 mg daily.

## 2024-02-12 NOTE — Telephone Encounter (Signed)
 Reviewed Dr. Alford Highland message and medication recommendations to start Toprol XL 25 mg with the patient. Pt aware, agreeable, and verbalized understanding. Rx sent to pt's preferred pharmacy.  Reminded pt of upcoming appt with Dr. Shirlee Latch, and instructed the pt to call if any questions or concerns arise.

## 2024-02-19 DIAGNOSIS — Z79899 Other long term (current) drug therapy: Secondary | ICD-10-CM | POA: Diagnosis not present

## 2024-02-19 DIAGNOSIS — M5416 Radiculopathy, lumbar region: Secondary | ICD-10-CM | POA: Diagnosis not present

## 2024-02-19 DIAGNOSIS — M47812 Spondylosis without myelopathy or radiculopathy, cervical region: Secondary | ICD-10-CM | POA: Diagnosis not present

## 2024-02-19 DIAGNOSIS — M48061 Spinal stenosis, lumbar region without neurogenic claudication: Secondary | ICD-10-CM | POA: Diagnosis not present

## 2024-02-19 DIAGNOSIS — K5909 Other constipation: Secondary | ICD-10-CM | POA: Diagnosis not present

## 2024-02-19 DIAGNOSIS — G894 Chronic pain syndrome: Secondary | ICD-10-CM | POA: Diagnosis not present

## 2024-02-19 DIAGNOSIS — G4709 Other insomnia: Secondary | ICD-10-CM | POA: Diagnosis not present

## 2024-02-19 DIAGNOSIS — M25579 Pain in unspecified ankle and joints of unspecified foot: Secondary | ICD-10-CM | POA: Diagnosis not present

## 2024-02-19 DIAGNOSIS — M542 Cervicalgia: Secondary | ICD-10-CM | POA: Diagnosis not present

## 2024-02-23 ENCOUNTER — Telehealth: Payer: Self-pay | Admitting: Cardiology

## 2024-02-23 NOTE — Telephone Encounter (Signed)
 Pt confirmed appt for 02/26/24

## 2024-02-26 ENCOUNTER — Encounter: Payer: Medicare Other | Admitting: Cardiology

## 2024-02-28 ENCOUNTER — Telehealth: Payer: Self-pay | Admitting: Family

## 2024-02-28 NOTE — Telephone Encounter (Signed)
 Pt confirmed appt for 02/29/24

## 2024-02-28 NOTE — Progress Notes (Signed)
 [facility] Note     PCP: Corwin Levins, MD HF Cardiology: Dr. Shirlee Latch  Chief complaint: Dyspnea, chest pain  78 y.o. with history of CAD, COPD, and HTN has been followed by Dr. Tenny Craw.  Patient reports that he had a heart attack in 1997 treated with angioplasty (records not available).  He had a cardiac cath in 2011 with mild nonobstructive CAD per report. He has a history of COPD and used to smoke.  More recently, he had gallstone pancreatitis in 8/24 requiring ERCP.  He had laparoscopic cholecystectomy in 8/24.    At visit with me in 10/24, he reported increased exertional dyspnea as well as atypical chest pain. Echo was done in 11/24 showing EF 55%, normal RV, mild MR, normal IVC.  Cardiac PET in 1/25 showed EF 53%, no ischemia/infarction but there was significantly abnormal myocardial flow reserve suggesting either balanced ischemia or microvascular dysfunction.   Patient returns for followup of dyspnea and chest pain.  He continues to get short episodes of central chest pain lasting generally < 1 minute.  Episodes are not clearly exertional but make him anxious. They will occur several times/week. He also gets pain in his upper back generally with exertion.  He is short of breath walking longer distances or doing moderate activity. No orthopnea/PND.  No lightheadedness or palpitations. He notes frequent palpitations and has frequent PACs on echo today.  He is very worried about his heart.   Labs (8/24): hgb 11, K 4.7, creatinine 0.86 Labs (10/24): LDL 70, BNP 149, K 4.5, creatinine 0.89  ECG (personally reviewed): NSR with frequent PACs  PMH: 1. HTN 2. COPD: Prior smoker 3. Hyperlipidemia 4. BPH 5. CAD: MI 1997 with angioplasty (per patient's report, do not have record).   - Cath 2011 with mild nonobstructive disease.  - Cardiolite (3/17): EF 59%, small fixed apical defect, low risk.  - Echo (11/24): EF 55%, normal RV, mild MR, normal IVC - Cardiac PET (1/25): EF  53%, no ischemia/infarction but there was abnormal myocardial flow reserve suggesting either balanced ischemia or microvascular dysfunction.  6. Low back pain/sciatica  7. OSA: Cannot tolerate CPAP.  8. Gallstone pancreatitis (8/24): Had ERCP then cholecystectomy.   SH: Married, prior smoker quit 1997, No ETOH.  Retired.   Family History  Problem Relation Age of Onset   Prostate cancer Father    Emphysema Father    Hyperlipidemia Father    Hyperlipidemia Mother    ROS: All systems reviewed and negative except as per HPI.   Current Outpatient Medications  Medication Sig Dispense Refill   albuterol (VENTOLIN HFA) 108 (90 Base) MCG/ACT inhaler INHALE 2 PUFFS INTO THE LUNGS 4 TIMES DAILY AS NEEDED 8.5 g 11   ALPRAZolam (XANAX) 0.5 MG tablet Take 0.5 mg by mouth 2 (two) times daily as needed for anxiety.     amLODipine (NORVASC) 5 MG tablet Take 1 tablet (5 mg total) by mouth daily. 180 tablet 3   aspirin 81 MG tablet Take 81 mg by mouth daily.     blood glucose meter kit and supplies KIT Dispense based on patient and insurance preference. Use up to four times daily as directed. 1 each 0   furosemide (LASIX) 40 MG tablet 1 tab by mouth once per day as needed (Patient taking differently: Take 40-80 mg by mouth daily.) 90 tablet 3   metoprolol succinate (TOPROL-XL) 50 MG 24 hr tablet Take 0.5 tablets (25 mg total) by mouth daily. Take with or  immediately following a meal. 45 tablet 3   MOVANTIK 25 MG TABS tablet Take 25 mg by mouth daily.     omeprazole (PRILOSEC) 20 MG capsule TAKE ONE CAPSULE BY MOUTH TWICE A DAY 180 capsule 3   oxycodone (ROXICODONE) 30 MG immediate release tablet 1 tab by mouth 5 times per day (Patient taking differently: Take 15-30 mg by mouth every 4 (four) hours as needed for pain. Do not take more than 5 tablets a day) 30 tablet 0   potassium chloride (KLOR-CON 10) 10 MEQ tablet 1 tab by mouth once daily when taking lasix (Patient not taking: Reported on 01/26/2024) 90  tablet 3   rosuvastatin (CRESTOR) 20 MG tablet Take 1 tablet (20 mg total) by mouth daily. 90 tablet 3   traZODone (DESYREL) 50 MG tablet Take 1 tablet (50 mg total) by mouth at bedtime as needed for sleep. (Patient taking differently: Take 100 mg by mouth at bedtime.) 30 tablet 0   No current facility-administered medications for this visit.   There were no vitals taken for this visit. General: NAD Neck: No JVD, no thyromegaly or thyroid nodule.  Lungs: Clear to auscultation bilaterally with normal respiratory effort. CV: Nondisplaced PMI.  Heart regular with PACs, no S3/S4, no murmur.  No peripheral edema.  No carotid bruit.  Normal pedal pulses.  Abdomen: Soft, nontender, no hepatosplenomegaly, no distention.  Skin: Intact without lesions or rashes.  Neurologic: Alert and oriented x 3.  Psych: Normal affect. Extremities: No clubbing or cyanosis.  HEENT: Normal.   Assessment/Plan: 1. CAD: Per report, he had MI in 1997 with angioplasty.  Cardiac PET in 1/25 showed  EF 53%, no ischemia/infarction but there was abnormal myocardial flow reserve suggesting either balanced ischemia or microvascular dysfunction. Patient continues to have episodes of primarily atypical chest pain but also exertional upper back pain and dyspnea. I think that we will need further investigation of his symptoms as the cardiac PET was not normal.   - We discussed evaluation by coronary CTA versus cardiac cath to investigate symptoms and abnormal PET.  He is very nervous about his heart and wishes to proceed with the definitive study.  We discussed risks/benefits and he agrees to cardiac catheterization/possible PCI.  - Continue ASA 81 daily.  - Goal LDL < 70, will stop simvastatin and start rosuvastatin 20 mg daily.  Lipids/LFTs in 2 months.   2. HTN: BP controlled.  - Continue amlodipine.  3. COPD: His dyspnea may be driven at least in part by COPD.  4. Palpitations: PACs noted on ECG.   - I will arrange for 7 day  Zio monitor to make sure he is not having atrial fibrillation runs.   I spent 41 minutes reviewing chart, interacting with patient, discussing procedure, and managing orders.   Delma Freeze 02/28/2024        Delma Freeze, FNP 02/28/24

## 2024-02-29 ENCOUNTER — Other Ambulatory Visit
Admission: RE | Admit: 2024-02-29 | Discharge: 2024-02-29 | Disposition: A | Discharge status: Home / Self Care | Source: Ambulatory Visit | Attending: Family | Admitting: Family

## 2024-02-29 ENCOUNTER — Ambulatory Visit (HOSPITAL_BASED_OUTPATIENT_CLINIC_OR_DEPARTMENT_OTHER): Admitting: Family

## 2024-02-29 ENCOUNTER — Encounter: Payer: Self-pay | Admitting: Family

## 2024-02-29 VITALS — BP 140/84 | HR 57 | Wt 164.0 lb

## 2024-02-29 DIAGNOSIS — I2510 Atherosclerotic heart disease of native coronary artery without angina pectoris: Secondary | ICD-10-CM | POA: Diagnosis not present

## 2024-02-29 DIAGNOSIS — I110 Hypertensive heart disease with heart failure: Secondary | ICD-10-CM | POA: Insufficient documentation

## 2024-02-29 DIAGNOSIS — R42 Dizziness and giddiness: Secondary | ICD-10-CM | POA: Insufficient documentation

## 2024-02-29 DIAGNOSIS — J449 Chronic obstructive pulmonary disease, unspecified: Secondary | ICD-10-CM

## 2024-02-29 DIAGNOSIS — I10 Essential (primary) hypertension: Secondary | ICD-10-CM | POA: Diagnosis not present

## 2024-02-29 DIAGNOSIS — I471 Supraventricular tachycardia, unspecified: Secondary | ICD-10-CM | POA: Diagnosis not present

## 2024-02-29 DIAGNOSIS — Z8249 Family history of ischemic heart disease and other diseases of the circulatory system: Secondary | ICD-10-CM | POA: Diagnosis not present

## 2024-02-29 DIAGNOSIS — I5032 Chronic diastolic (congestive) heart failure: Secondary | ICD-10-CM | POA: Diagnosis not present

## 2024-02-29 DIAGNOSIS — Z87891 Personal history of nicotine dependence: Secondary | ICD-10-CM | POA: Diagnosis not present

## 2024-02-29 DIAGNOSIS — I493 Ventricular premature depolarization: Secondary | ICD-10-CM | POA: Diagnosis not present

## 2024-02-29 DIAGNOSIS — R002 Palpitations: Secondary | ICD-10-CM | POA: Diagnosis not present

## 2024-02-29 DIAGNOSIS — Z7982 Long term (current) use of aspirin: Secondary | ICD-10-CM | POA: Diagnosis not present

## 2024-02-29 DIAGNOSIS — Z79899 Other long term (current) drug therapy: Secondary | ICD-10-CM | POA: Insufficient documentation

## 2024-02-29 DIAGNOSIS — R5383 Other fatigue: Secondary | ICD-10-CM | POA: Insufficient documentation

## 2024-02-29 LAB — COMPREHENSIVE METABOLIC PANEL WITH GFR
ALT: 14 U/L (ref 0–44)
AST: 24 U/L (ref 15–41)
Albumin: 3.9 g/dL (ref 3.5–5.0)
Alkaline Phosphatase: 64 U/L (ref 38–126)
Anion gap: 14 (ref 5–15)
BUN: 18 mg/dL (ref 8–23)
CO2: 33 mmol/L — ABNORMAL HIGH (ref 22–32)
Calcium: 9.2 mg/dL (ref 8.9–10.3)
Chloride: 90 mmol/L — ABNORMAL LOW (ref 98–111)
Creatinine, Ser: 1.15 mg/dL (ref 0.61–1.24)
GFR, Estimated: >60 mL/min
Glucose, Bld: 120 mg/dL — ABNORMAL HIGH (ref 70–99)
Potassium: 3 mmol/L — ABNORMAL LOW (ref 3.5–5.1)
Sodium: 137 mmol/L (ref 135–145)
Total Bilirubin: 0.8 mg/dL (ref 0.0–1.2)
Total Protein: 6.9 g/dL (ref 6.5–8.1)

## 2024-02-29 LAB — LIPID PANEL
Cholesterol: 104 mg/dL (ref 0–200)
HDL: 59 mg/dL
LDL Cholesterol: 33 mg/dL (ref 0–99)
Total CHOL/HDL Ratio: 1.8 ratio
Triglycerides: 62 mg/dL
VLDL: 12 mg/dL (ref 0–40)

## 2024-02-29 MED ORDER — POTASSIUM CHLORIDE CRYS ER 20 MEQ PO TBCR: EXTENDED_RELEASE_TABLET | ORAL | 0 refills | Status: DC

## 2024-02-29 MED ORDER — POTASSIUM CHLORIDE CRYS ER 20 MEQ PO TBCR: 20.0000 meq | EXTENDED_RELEASE_TABLET | Freq: Every day | ORAL | 3 refills | Status: AC

## 2024-02-29 NOTE — Addendum Note (Signed)
 Addended by: Jola Schmidt A on: 02/29/2024 04:21 PM   Modules accepted: Orders

## 2024-02-29 NOTE — Patient Instructions (Addendum)
 Medication Changes:  No medication changes   Lab Work:  Go DOWN to LOWER LEVEL (LL) to have your blood work completed inside of [employer] office.  We will only call you if the results are abnormal or if the provider would like to make medication changes.   Special Instructions // Education:  It was great to meet you today!  Follow-Up in: 3 months  At the [facility], you and your health needs are our priority. We have a designated team specialized in the treatment of Heart Failure. This Care Team includes your primary Heart Failure Specialized Cardiologist (physician), Advanced Practice Providers (APPs- Physician Assistants and Nurse Practitioners), and Pharmacist who all work together to provide you with the care you need, when you need it.   You may see any of the following providers on your designated Care Team at your next follow up:  Dr. Arvilla Meres Dr. Marca Ancona Dr. Dorthula Nettles Dr. Theresia Bough Clarisa Kindred, FNP Enos Fling, RPH-CPP  Please be sure to bring in all your medications bottles to every appointment.   Need to Contact us:  If you have any questions or concerns before your next appointment please send [place] a message through [place] or call our office at [phone].    TO LEAVE A MESSAGE FOR THE NURSE SELECT OPTION 2, PLEASE LEAVE A MESSAGE INCLUDING: YOUR NAME DATE OF BIRTH CALL BACK NUMBER REASON FOR CALL**this is important as we prioritize the call backs  YOU WILL RECEIVE A CALL BACK THE SAME DAY AS LONG AS YOU CALL BEFORE 4:00 PM

## 2024-03-04 ENCOUNTER — Telehealth: Payer: Self-pay | Admitting: Family

## 2024-03-04 NOTE — Progress Notes (Signed)
 Called and spoke with pt's wife/pt. They spoke with the pharmacist and they advised him to put the tablets in water, let them dissolve, then drink it. Pt's wife stated pt refused to do that and would like to get smaller pills like he has in the previously. They stated they would call the pharmacy and see what could be done. They plan to call us back if the pharmacy suggests we prescribe a specific potassium tablet.

## 2024-03-21 ENCOUNTER — Ambulatory Visit: Admitting: Dermatology

## 2024-03-21 DIAGNOSIS — Z85828 Personal history of other malignant neoplasm of skin: Secondary | ICD-10-CM | POA: Diagnosis not present

## 2024-03-21 DIAGNOSIS — W908XXA Exposure to other nonionizing radiation, initial encounter: Secondary | ICD-10-CM | POA: Diagnosis not present

## 2024-03-21 DIAGNOSIS — L814 Other melanin hyperpigmentation: Secondary | ICD-10-CM

## 2024-03-21 DIAGNOSIS — C4492 Squamous cell carcinoma of skin, unspecified: Secondary | ICD-10-CM | POA: Insufficient documentation

## 2024-03-21 DIAGNOSIS — L578 Other skin changes due to chronic exposure to nonionizing radiation: Secondary | ICD-10-CM

## 2024-03-21 DIAGNOSIS — C44729 Squamous cell carcinoma of skin of left lower limb, including hip: Secondary | ICD-10-CM

## 2024-03-21 DIAGNOSIS — C4402 Squamous cell carcinoma of skin of lip: Secondary | ICD-10-CM | POA: Diagnosis not present

## 2024-03-21 DIAGNOSIS — L821 Other seborrheic keratosis: Secondary | ICD-10-CM

## 2024-03-21 DIAGNOSIS — D492 Neoplasm of unspecified behavior of bone, soft tissue, and skin: Secondary | ICD-10-CM | POA: Diagnosis not present

## 2024-03-21 MED ORDER — MUPIROCIN 2 % EX OINT: TOPICAL_OINTMENT | CUTANEOUS | 0 refills | Status: DC

## 2024-03-21 NOTE — Patient Instructions (Addendum)

## 2024-03-21 NOTE — Progress Notes (Unsigned)
 Follow-Up Visit   Subjective  Albert Wade is a 78 y.o. male who presents for the following: Patient c/o a growth on the left bottom lower lip x 9 months painful and sore.  The patient has a spot on his left leg to be evaluated, some may be new or changing and the patient may have concern these could be cancer. Patient with a history of SCC   The following portions of the chart were reviewed this encounter and updated as appropriate: medications, allergies, medical history  Review of Systems:  No other skin or systemic complaints except as noted in HPI or Assessment and Plan.  Objective  Well appearing patient in no apparent distress; mood and affect are within normal limits.    A focused examination was performed of the following areas:face, lips, left leg    Relevant exam findings are noted in the Assessment and Plan.         central lower lip 9.0 mm pink scaly nodule left lateral pretibial 1.3 cm pink tender scaly nodule   Assessment & Plan   ACTINIC DAMAGE - chronic, secondary to cumulative UV radiation exposure/sun exposure over time - diffuse scaly erythematous macules with underlying dyspigmentation - Recommend daily broad spectrum sunscreen SPF 30+ to sun-exposed areas, reapply every 2 hours as needed.  - Recommend staying in the shade or wearing long sleeves, sun glasses (UVA+UVB protection) and wide brim hats (4-inch brim around the entire circumference of the hat). - Call for new or changing lesions.   LENTIGINES Exam: scattered tan macules Due to sun exposure Treatment Plan: Benign-appearing, observe. Recommend daily broad spectrum sunscreen SPF 30+ to sun-exposed areas, reapply every 2 hours as needed.  Call for any changes   SEBORRHEIC KERATOSIS - Stuck-on, waxy, tan-brown papules and/or plaques  - Benign-appearing - Discussed benign etiology and prognosis. - Observe - Call for any changes  HISTORY OF SQUAMOUS CELL CARCINOMA OF THE SKIN -  No evidence of recurrence today L and R forearm - Recommend regular full body skin exams - Recommend daily broad spectrum sunscreen SPF 30+ to sun-exposed areas, reapply every 2 hours as needed.  - Call if any new or changing lesions are noted between office visits    NEOPLASM OF SKIN (2) central lower lip Skin / nail biopsy Type of biopsy: tangential   Informed consent: discussed and consent obtained   Patient was prepped and draped in usual sterile fashion: area prepped with alochol. Anesthesia: the lesion was anesthetized in a standard fashion   Anesthetic:  1% lidocaine w/ epinephrine 1-100,000 buffered w/ 8.4% NaHCO3 Instrument used: flexible razor blade   Hemostasis achieved with: pressure, aluminum chloride and electrodesiccation   Outcome: patient tolerated procedure well   Post-procedure details: wound care instructions given   Post-procedure details comment:  Ointment and small bandage Specimen 1 - Surgical pathology Differential Diagnosis: R/O SCC  Check Margins: No left lateral pretibial Epidermal / dermal shaving  Lesion diameter (cm):  1.3 Informed consent: discussed and consent obtained   Patient was prepped and draped in usual sterile fashion: area prepped with alcohol. Anesthesia: the lesion was anesthetized in a standard fashion   Anesthetic:  1% lidocaine w/ epinephrine 1-100,000 buffered w/ 8.4% NaHCO3 Instrument used: flexible razor blade   Hemostasis achieved with: pressure, aluminum chloride and electrodesiccation   Outcome: patient tolerated procedure well    Destruction of lesion  Destruction method: electrodesiccation and curettage   Informed consent: discussed and consent obtained   Curettage performed  in three different directions: Yes   Electrodesiccation performed over the curetted area: Yes   Final wound size (cm):  1.3 Hemostasis achieved with:  pressure, aluminum chloride and electrodesiccation Outcome: patient tolerated procedure well with  no complications   Post-procedure details: wound care instructions given   Additional details:  Mupirocin ointment and Bandaid applied  Specimen 2 - Surgical pathology Differential Diagnosis: R/O SCC  Check Margins: No Plan on Mohs surgery with Dr Caralyn Guile if pathology on the lower lip confirm skin cancer   Return if symptoms worsen or fail to improve.  I, Angelique Holm, CMA, am acting as scribe for Willeen Niece, MD .   Documentation: I have reviewed the above documentation for accuracy and completeness, and I agree with the above.  Willeen Niece, MD

## 2024-03-25 ENCOUNTER — Telehealth: Payer: Self-pay

## 2024-03-25 DIAGNOSIS — C4492 Squamous cell carcinoma of skin, unspecified: Secondary | ICD-10-CM

## 2024-03-25 LAB — SURGICAL PATHOLOGY

## 2024-03-25 NOTE — Telephone Encounter (Signed)
-----   Message from Willeen Niece sent at 03/25/2024  5:17 PM EDT ----- 1. Skin, central lower lip :       WELL DIFFERENTIATED SQUAMOUS CELL CARCINOMA  2. Skin, left lateral pretibial :       WELL DIFFERENTIATED SQUAMOUS CELL CARCINOMA   1. SCC skin cancer- needs Mohs surgery referral to Dr. Caralyn Guile 2. SCC skin cancer- already treated with EDC at time of biopsy   - please call patient

## 2024-03-25 NOTE — Telephone Encounter (Signed)
 Advised pt and wife of bx results.  Discussed mohs with patient and wife.  Advised referral  sent to Dr. Santo Held

## 2024-04-30 ENCOUNTER — Other Ambulatory Visit: Payer: Self-pay | Admitting: Gastroenterology

## 2024-05-07 ENCOUNTER — Encounter: Payer: Self-pay | Admitting: Dermatology

## 2024-05-13 ENCOUNTER — Encounter: Admitting: Dermatology

## 2024-05-13 DIAGNOSIS — C4492 Squamous cell carcinoma of skin, unspecified: Secondary | ICD-10-CM

## 2024-06-06 DIAGNOSIS — K5909 Other constipation: Secondary | ICD-10-CM | POA: Diagnosis not present

## 2024-06-06 DIAGNOSIS — M5416 Radiculopathy, lumbar region: Secondary | ICD-10-CM | POA: Diagnosis not present

## 2024-06-06 DIAGNOSIS — M48061 Spinal stenosis, lumbar region without neurogenic claudication: Secondary | ICD-10-CM | POA: Diagnosis not present

## 2024-06-06 DIAGNOSIS — M47812 Spondylosis without myelopathy or radiculopathy, cervical region: Secondary | ICD-10-CM | POA: Diagnosis not present

## 2024-06-06 DIAGNOSIS — G4709 Other insomnia: Secondary | ICD-10-CM | POA: Diagnosis not present

## 2024-06-06 DIAGNOSIS — Z79899 Other long term (current) drug therapy: Secondary | ICD-10-CM | POA: Diagnosis not present

## 2024-06-06 DIAGNOSIS — M542 Cervicalgia: Secondary | ICD-10-CM | POA: Diagnosis not present

## 2024-06-06 DIAGNOSIS — M25579 Pain in unspecified ankle and joints of unspecified foot: Secondary | ICD-10-CM | POA: Diagnosis not present

## 2024-06-06 DIAGNOSIS — G894 Chronic pain syndrome: Secondary | ICD-10-CM | POA: Diagnosis not present

## 2024-06-26 ENCOUNTER — Telehealth: Payer: Self-pay

## 2024-06-26 NOTE — Telephone Encounter (Signed)
 Called pt to discuss mohs appt with Dr. Paci that he had to cancel on 05/13/24.  Spoke to patient's wife and she said they had to cancel due to family emergency and she had been meaning to call our office to get the mohs doctors name and phone number.   I did give her Dr. Armando phone number and I advised I would send a message to Dr. Armando office [facility]) and see if they could reach out to patient to get him rescheduled for mohs for [facility] of the central lower lip./sh

## 2024-07-24 ENCOUNTER — Ambulatory Visit: Admitting: Podiatry

## 2024-07-30 ENCOUNTER — Encounter: Payer: Self-pay | Admitting: Dermatology

## 2024-07-30 ENCOUNTER — Ambulatory Visit: Admitting: Dermatology

## 2024-07-30 DIAGNOSIS — L814 Other melanin hyperpigmentation: Secondary | ICD-10-CM | POA: Diagnosis not present

## 2024-07-30 DIAGNOSIS — C4492 Squamous cell carcinoma of skin, unspecified: Secondary | ICD-10-CM

## 2024-07-30 DIAGNOSIS — C4402 Squamous cell carcinoma of skin of lip: Secondary | ICD-10-CM | POA: Diagnosis not present

## 2024-07-30 DIAGNOSIS — L579 Skin changes due to chronic exposure to nonionizing radiation, unspecified: Secondary | ICD-10-CM | POA: Diagnosis not present

## 2024-07-30 MED ORDER — MUPIROCIN 2 % EX OINT: 1.0000 | TOPICAL_OINTMENT | Freq: Two times a day (BID) | CUTANEOUS | 2 refills | Status: AC

## 2024-07-30 NOTE — Progress Notes (Signed)
 Follow-Up Visit   Subjective  Albert Wade is a 78 y.o. 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 Jackquline.   The following portions of the chart were reviewed this encounter and updated as appropriate: medications, allergies, medical history  Review of Systems:  No other skin or systemic complaints except as noted in HPI or Assessment and Plan.  Objective  Well appearing patient in no apparent distress; mood and affect are within normal limits.  A focused examination was performed of the following areas: Central lower lip Relevant physical exam findings are noted in the Assessment and Plan.   central lower lip Healing biopsy site   Assessment & Plan   SQUAMOUS CELL CARCINOMA OF SKIN central lower lip Mohs surgery  Consent obtained: written  Anticoagulation: Was the anticoagulation regimen changed prior to Mohs? No    Anesthesia: Anesthesia method: local infiltration Local anesthetic: lidocaine  1% WITH epi  Procedure Details: Timeout: pre-procedure verification complete Procedure Prep: patient was prepped and draped in usual sterile fashion Biopsy accession number: [id number] Pre-Op diagnosis: squamous cell carcinoma SCC subtype: well differentiated MohsAIQ Surgical site (if tumor spans multiple areas, please select predominant area): cutaneous lip Surgery side: midline Surgical site (from skin exam): central lower lip Pre-operative length (cm): 1.5 Pre-operative width (cm): 0.6 Indications for Mohs surgery: anatomic location where tissue conservation is critical  Micrographic Surgery Details: Post-operative length (cm): 3 Post-operative width (cm): 1.5 Number of Mohs stages: 2 Cumulative additional sections past 5 per stage: 0 Post surgery depth of defect: skeletal muscle  Stage 1    Tumor features identified on Mohs section: squamous cell carcinoma  Stage  2    Tumor features identified on Mohs section: no tumor identified  Reconstruction: Was the defect reconstructed?: No      Return in about 3 weeks (around 08/20/2024) for wound check of lip.  Albert Wade, CMA, am acting as scribe for RUFUS CHRISTELLA HOLY, MD.    07/30/2024  HISTORY OF PRESENT ILLNESS  Albert Wade is seen in consultation at the request of Dr. Jackquline for biopsy-proven Well Differentiated Squamous Cell Carcinoma on the central lower lip. They note that the area has been present for about 6 months increasing in size with time.  There is no history of previous treatment.  Reports no other new or changing lesions and has no other complaints today.  Medications and allergies: see patient chart.  Review of systems: Reviewed 8 systems and notable for the above skin cancer.  All other systems reviewed are unremarkable/negative, unless noted in the HPI. Past medical history, surgical history, family history, social history were also reviewed and are noted in the chart/questionnaire.    PHYSICAL EXAMINATION  General: Well-appearing, in no acute distress, alert and oriented x 4. Vitals reviewed in chart (if available).   Skin: Exam reveals a 1.5 x 0.6 cm erythematous papule and biopsy scar on the central lower lip. There are rhytids, telangiectasias, and lentigines, consistent with photodamage.  Biopsy report(s) reviewed, confirming the diagnosis.   ASSESSMENT  1) Well Differentiated Squamous Cell Carcinoma of the  2) photodamage 3) solar lentigines   PLAN   1. Due to location, size, histology, or recurrence and the likelihood of subclinical extension as well as the need to conserve normal surrounding tissue, the patient was deemed acceptable for Mohs micrographic surgery (MMS).  The nature and purpose of the procedure, associated benefits and  risks including recurrence and scarring, possible complications such as pain, infection, and bleeding, and alternative methods of  treatment if appropriate were discussed with the patient during consent. The lesion location was verified by the patient, by reviewing previous notes, pathology reports, and by photographs as well as angulation measurements if available.  Informed consent was reviewed and signed by the patient, and timeout was performed at 8:30 AM. See op note below.  2. For the photodamage and solar lentigines, sun protection discussed/information given on OTC sunscreens, and we recommend continued regular follow-up with primary dermatologist every 6 months or sooner for any growing, bleeding, or changing lesions. 3. Prognosis and future surveillance discussed. 4. Letter with treatment outcome sent to referring provider. 5. Pain acetaminophen /ibuprofen /patient already takes percocet for his back  MOHS MICROGRAPHIC SURGERY AND RECONSTRUCTION  Initial size:   1.5 x 0.6 cm Surgical defect/wound size: 3.0 x 1.5 cm Anesthesia:    0.33% lidocaine  with 1:200,000 epinephrine  EBL:    <5 mL Complications:  None Repair type:   Second Intention    Stages: 2  STAGE I: Anesthesia achieved with 0.5% lidocaine  with 1:200,000 epinephrine . ChloraPrep applied. 1 section(s) excised using Mohs technique (this includes total peripheral and deep tissue margin excision and evaluation with frozen sections, excised and interpreted by the same physician). The tumor was first debulked and then excised with an approx. 2 mm margin.  Hemostasis was achieved with electrocautery as needed.  The specimen was then oriented, subdivided/relaxed, inked, and processed using Mohs technique.    Frozen section analysis revealed a positive margin for full thickness epidermal architectural and cellular atypia, apoptotic cells, individual cell dyskeratosis with markedly altered maturation but usually still some surface keratinization and intercellular bridges present with marked nuclear atypia, including nuclear hyperchromasia and multinucleation in the  peripheral margin.    STAGE II: An additional 2 mm margin was excised.  Hemostasis was achieved with electrocautery as needed.  The specimen was then oriented, subdivided/relaxed, inked, and processed using Mohs technique. Evaluation of slides by the Mohs surgeon revealed clear tumor margins.  Reconstruction  Patient was notified of results and repair options were discussed, including second intention healing. After reviewing the advantages and disadvantages of each, we agreed on second intention healing as appropriate.   The surgical site was then lightly scrubbed with sterile, saline-soaked gauze.  The area was bandaged using Vaseline ointment, non-adherent gauze, gauze pads, and tape to provide an adequate pressure dressing.   The patient tolerated the procedure well, was given detailed written and verbal wound care instructions, and was discharged in good condition.  The patient will follow-up in 3 weeks and as scheduled with primary dermatologist.  Documentation: I have reviewed the above documentation for accuracy and completeness, and I agree with the above.  RUFUS CHRISTELLA HOLY, MD

## 2024-07-30 NOTE — Patient Instructions (Signed)

## 2024-07-31 ENCOUNTER — Encounter: Payer: Self-pay | Admitting: Dermatology

## 2024-08-05 ENCOUNTER — Other Ambulatory Visit: Payer: Self-pay

## 2024-08-05 ENCOUNTER — Telehealth: Payer: Self-pay | Admitting: Dermatology

## 2024-08-05 NOTE — Telephone Encounter (Signed)
 Patients wife called and said that Albert Wade is experiencing a lot of pain today and nothing was helping.  He tried Tylenol  and Motrin  with no help.  He has a rx for Oxycodone  for another issue and that hasn't helped either.  I asked if they tried ice, They said no.  I recommended they use ice every hour for 15 mins. I also asked if they wanted to come in to be seen since it is abnormal to be experiencing this much pain a week out. I also offered them to send in a photo for us  to look at to see if it looks infected.  They stated they wanted to try the ice and would send in a photo then cal back if the pain isnt better.

## 2024-08-06 ENCOUNTER — Telehealth: Payer: Self-pay | Admitting: Cardiology

## 2024-08-06 NOTE — Telephone Encounter (Signed)
 Called to confirm/remind patient of their appointment at the [facility] on 08/07/24.   Appointment:   [x] Confirmed  [] Left mess   [] No answer/No voice mail  [] VM Full/unable to leave message  [] Phone not in service  Patient reminded to bring all medications and/or complete list.  Confirmed patient has transportation. Gave directions, instructed to utilize valet parking.

## 2024-08-07 ENCOUNTER — Encounter: Admitting: Cardiology

## 2024-08-09 ENCOUNTER — Telehealth: Payer: Self-pay | Admitting: Dermatology

## 2024-08-09 NOTE — Telephone Encounter (Signed)
 Patients wife called and stated her husband had a black thing fall of his wound.  I told her without pictures its hard to say for sure what it is but it sounds like it was a scab from the wound being too dry.  I told her he should keep it constantly covered in vasaline or aquaphor, applying it as often as needed to keep it covered.  She said they would do that.  I asked her if she would like for him to be seen on Monday she stated they would try that and call back for a sooner appointment if she felt it was needed.

## 2024-08-12 ENCOUNTER — Telehealth: Payer: Self-pay | Admitting: Family

## 2024-08-12 NOTE — Telephone Encounter (Signed)
 Called to confirm/remind patient of their appointment at the [facility] on 08/13/24.   Appointment:   [] Confirmed  [x] Left mess   [] No answer/No voice mail  [] VM Full/unable to leave message  [] Phone not in service  Patient reminded to bring all medications and/or complete list.  Confirmed patient has transportation. Gave directions, instructed to utilize valet parking.

## 2024-08-13 ENCOUNTER — Encounter: Payer: Self-pay | Admitting: Cardiology

## 2024-08-13 ENCOUNTER — Ambulatory Visit (HOSPITAL_BASED_OUTPATIENT_CLINIC_OR_DEPARTMENT_OTHER): Admitting: Cardiology

## 2024-08-13 ENCOUNTER — Other Ambulatory Visit
Admission: RE | Admit: 2024-08-13 | Discharge: 2024-08-13 | Disposition: A | Discharge status: Home / Self Care | Source: Ambulatory Visit | Attending: Cardiology | Admitting: Cardiology

## 2024-08-13 VITALS — BP 153/67 | HR 52 | Wt 172.2 lb

## 2024-08-13 DIAGNOSIS — I252 Old myocardial infarction: Secondary | ICD-10-CM | POA: Diagnosis not present

## 2024-08-13 DIAGNOSIS — Z7982 Long term (current) use of aspirin: Secondary | ICD-10-CM | POA: Insufficient documentation

## 2024-08-13 DIAGNOSIS — Z125 Encounter for screening for malignant neoplasm of prostate: Secondary | ICD-10-CM | POA: Insufficient documentation

## 2024-08-13 DIAGNOSIS — Z79899 Other long term (current) drug therapy: Secondary | ICD-10-CM | POA: Insufficient documentation

## 2024-08-13 DIAGNOSIS — I110 Hypertensive heart disease with heart failure: Secondary | ICD-10-CM | POA: Insufficient documentation

## 2024-08-13 DIAGNOSIS — J449 Chronic obstructive pulmonary disease, unspecified: Secondary | ICD-10-CM | POA: Diagnosis not present

## 2024-08-13 DIAGNOSIS — Z87891 Personal history of nicotine dependence: Secondary | ICD-10-CM | POA: Insufficient documentation

## 2024-08-13 DIAGNOSIS — I5032 Chronic diastolic (congestive) heart failure: Secondary | ICD-10-CM | POA: Insufficient documentation

## 2024-08-13 DIAGNOSIS — I2510 Atherosclerotic heart disease of native coronary artery without angina pectoris: Secondary | ICD-10-CM | POA: Insufficient documentation

## 2024-08-13 LAB — BASIC METABOLIC PANEL WITH GFR
Anion gap: 7 (ref 5–15)
BUN: 15 mg/dL (ref 8–23)
CO2: 29 mmol/L (ref 22–32)
Calcium: 8.9 mg/dL (ref 8.9–10.3)
Chloride: 107 mmol/L (ref 98–111)
Creatinine, Ser: 0.82 mg/dL (ref 0.61–1.24)
GFR, Estimated: >60 mL/min
Glucose, Bld: 104 mg/dL — ABNORMAL HIGH (ref 70–99)
Potassium: 4.1 mmol/L (ref 3.5–5.1)
Sodium: 143 mmol/L (ref 135–145)

## 2024-08-13 LAB — PSA: Prostatic Specific Antigen: <0.01 ng/mL (ref 0.00–4.00)

## 2024-08-13 LAB — BRAIN NATRIURETIC PEPTIDE: B Natriuretic Peptide: 223.1 pg/mL — ABNORMAL HIGH (ref 0.0–100.0)

## 2024-08-13 MED ORDER — AMLODIPINE BESYLATE 10 MG PO TABS: 10.0000 mg | ORAL_TABLET | Freq: Every day | ORAL | 3 refills | Status: AC

## 2024-08-13 NOTE — Patient Instructions (Addendum)
 Medication Changes:  INCREASE Amlodipine  to 10mg  (1 tab) daily  Lab Work:  Go over to the [facility]. Go pass the gift shop and have your blood work completed.  We will only call you if the results are abnormal or if the provider would like to make medication changes.  No news is good news.   Follow-Up in: Please follow up with the [facility] in 6 months with Dr. Rolan. We do not currently have that schedule. Please give us  a call in January 2026 in order to schedule your appointment for February 2026.   Thank you for choosing [facility].    At the [facility], you and your health needs are our priority. We have a designated team specialized in the treatment of Heart Failure. This Care Team includes your primary Heart Failure Specialized Cardiologist (physician), Advanced Practice Providers (APPs- Physician Assistants and Nurse Practitioners), and Pharmacist who all work together to provide you with the care you need, when you need it.   You may see any of the following providers on your designated Care Team at your next follow up:  Dr. Toribio Fuel Dr. Ezra Rolan Dr. Ria Commander Dr. Morene Brownie Ellouise Class, FNP Jaun Bash, RPH-CPP  Please be sure to bring in all your medications bottles to every appointment.   Need to Contact Us :  If you have any questions or concerns before your next appointment please send us  a message through [place] or call our office at [phone].    TO LEAVE A MESSAGE FOR THE NURSE SELECT OPTION 2, PLEASE LEAVE A MESSAGE INCLUDING: YOUR NAME DATE OF BIRTH CALL BACK NUMBER REASON FOR CALL**this is important as we prioritize the call backs  YOU WILL RECEIVE A CALL BACK THE SAME DAY AS LONG AS YOU CALL BEFORE 4:00 PM

## 2024-08-14 ENCOUNTER — Ambulatory Visit (HOSPITAL_COMMUNITY): Payer: Self-pay | Admitting: Cardiology

## 2024-08-14 NOTE — Progress Notes (Signed)
 PCP: Norleen Lynwood ORN, MD HF Cardiology: Dr. Rolan  Chief complaint: Dyspnea, chest pain  78 y.o. with history of CAD, COPD, and HTN has been followed by Dr. Okey.  Patient reports that he had a heart attack in 1997 treated with angioplasty (records not available).  He had a cardiac cath in 2011 with mild nonobstructive CAD per report. He has a history of COPD and used to smoke.  More recently, he had gallstone pancreatitis in 8/24 requiring ERCP.  He had laparoscopic cholecystectomy in 8/24.    At visit with me in 10/24, he reported increased exertional dyspnea as well as atypical chest pain. Echo was done in 11/24 showing EF 55%, normal RV, mild MR, normal IVC.  Cardiac PET in 1/25 showed EF 53%, no ischemia/infarction but there was significantly abnormal myocardial flow reserve suggesting either balanced ischemia or microvascular dysfunction.  LHC in 2/25 showed 60% mLCx, 40% mRCA (medically managed).  Zio monitor in 2/25 showed SVT runs (longest 22 sec, ?atrial tachycardia), 12.5% PACs.  He was started on Toprol  XL with resolution of palpitation.   Patient returns for followup of CAD, palpitations.  No further chest pain.  BP is elevated.  Weight is up, reports poor eating choices.  No palpitations since starting Toprol  XL.  Recently had skin cancer removed from lower lip. No exertional dyspnea. HR is relatively slow but no lightheadedness.   Labs (8/24): hgb 11, K 4.7, creatinine 0.86 Labs (10/24): LDL 70, BNP 149, K 4.5, creatinine 0.89 Labs (3/25): LDL 33, K 3, creatinine 1.15  ECG (personally reviewed): Sinus brady rate 49  PMH: 1. HTN 2. COPD: Prior smoker 3. Hyperlipidemia 4. BPH 5. CAD: MI 1997 with angioplasty (per patient's report, do not have record).   - Cath 2011 with mild nonobstructive disease.  - Cardiolite  (3/17): EF 59%, small fixed apical defect, low risk.  - Echo (11/24): EF 55%, normal RV, mild MR, normal IVC - Cardiac PET (1/25): EF 53%, no ischemia/infarction but  there was abnormal myocardial flow reserve suggesting either balanced ischemia or microvascular dysfunction.  - LHC (2/25):  60% mLCx, 40% mRCA (medically managed). 6. Low back pain/sciatica  7. OSA: Cannot tolerate CPAP.  8. Gallstone pancreatitis (8/24): Had ERCP then cholecystectomy.  9. PACs, atrial tachycardia: Zio monitor (2/25) with SVT runs (longest 22 sec, ?atrial tachycardia), 12.5% PACs.  SH: Married, prior smoker quit 1997, No ETOH.  Retired.   Family History  Problem Relation Age of Onset   Prostate cancer Father    Emphysema Father    Hyperlipidemia Father    Hyperlipidemia Mother    ROS: All systems reviewed and negative except as per HPI.   Current Outpatient Medications  Medication Sig Dispense Refill   albuterol  (VENTOLIN  HFA) 108 (90 Base) MCG/ACT inhaler INHALE 2 PUFFS INTO THE LUNGS 4 TIMES DAILY AS NEEDED 8.5 g 11   aspirin  81 MG tablet Take 81 mg by mouth daily.     furosemide  (LASIX ) 40 MG tablet 1 tab by mouth once per day as needed (Patient taking differently: Take 20 mg by mouth daily as needed for edema.) 90 tablet 3   metoprolol  succinate (TOPROL -XL) 50 MG 24 hr tablet Take 0.5 tablets (25 mg total) by mouth daily. Take with or immediately following a meal. 45 tablet 3   MOVANTIK 25 MG TABS tablet Take 25 mg by mouth daily.     mupirocin  ointment (BACTROBAN ) 2 % Apply 1 Application topically 2 (two) times daily. 22 g 2  omeprazole  (PRILOSEC) 20 MG capsule TAKE 1 CAPSULE BY MOUTH TWICE DAILY 180 capsule 3   oxycodone  (ROXICODONE ) 30 MG immediate release tablet 1 tab by mouth 5 times per day (Patient taking differently: Take 10 mg by mouth every 4 (four) hours as needed for pain. Do not take more than 5 tablets a day) 30 tablet 0   rosuvastatin  (CRESTOR ) 20 MG tablet Take 1 tablet (20 mg total) by mouth daily. 90 tablet 3   traZODone  (DESYREL ) 50 MG tablet Take 1 tablet (50 mg total) by mouth at bedtime as needed for sleep. (Patient taking differently: Take  100 mg by mouth at bedtime.) 30 tablet 0   amLODipine  (NORVASC ) 10 MG tablet Take 1 tablet (10 mg total) by mouth daily. 90 tablet 3   potassium chloride  SA (KLOR-CON  M) 20 MEQ tablet Take 1 tablet (20 mEq total) by mouth daily. (Patient not taking: Reported on 08/13/2024) 90 tablet 3   No current facility-administered medications for this visit.   BP (!) 153/67 (BP Location: Left Arm, Patient Position: Sitting, Cuff Size: Normal)   Pulse (!) 52   Wt 172 lb 4 oz (78.1 kg)   SpO2 100%   BMI 24.72 kg/m  General: NAD Neck: No JVD, no thyromegaly or thyroid  nodule.  Lungs: Clear to auscultation bilaterally with normal respiratory effort. CV: Nondisplaced PMI.  Heart regular S1/S2, no S3/S4, no murmur.  No peripheral edema.  No carotid bruit.  Normal pedal pulses.  Abdomen: Soft, nontender, no hepatosplenomegaly, no distention.  Skin: Intact without lesions or rashes.  Neurologic: Alert and oriented x 3.  Psych: Normal affect. Extremities: No clubbing or cyanosis.  HEENT: Normal.   Assessment/Plan: 1. CAD: Per report, he had MI in 1997 with angioplasty.  Cardiac PET in 1/25 showed  EF 53%, no ischemia/infarction but there was abnormal myocardial flow reserve suggesting either balanced ischemia or microvascular dysfunction. LHC in 2/25 with moderate nonobstructive disease, suspect PET abnormality was due to microvascular dysfunction. No chest pain.  - Continue ASA 81 daily.  - Continue Crestor  20 mg daily, LDL 33 (goal < 55) in 3/25.    2. HTN: BP elevated.  - Increase amlodipine  to 10 mg daily.  3. COPD: His dyspnea may be driven at least in part by COPD.  4. Palpitations: PACs and short AT runs, resolved on Toprol  XL 25 mg daily.  - Continue Toprol  XL 25 mg daily. Mild resting bradycardia is acceptable.  5. HFpEF: He can continue Lasix  20 mg daily, will check BMET/BNP today.    Followup in 6 months.   I spent 31 minutes reviewing chart, interacting with patient, discussing procedure,  and managing orders.   Albert Wade 08/14/2024

## 2024-08-19 ENCOUNTER — Ambulatory Visit: Admitting: Dermatology

## 2024-08-23 ENCOUNTER — Encounter: Payer: Medicare Other | Admitting: Internal Medicine

## 2024-08-28 ENCOUNTER — Ambulatory Visit: Admitting: Dermatology

## 2024-09-09 ENCOUNTER — Ambulatory Visit (INDEPENDENT_AMBULATORY_CARE_PROVIDER_SITE_OTHER): Admitting: Dermatology

## 2024-09-09 ENCOUNTER — Encounter: Payer: Self-pay | Admitting: Dermatology

## 2024-09-09 DIAGNOSIS — L905 Scar conditions and fibrosis of skin: Secondary | ICD-10-CM

## 2024-09-09 DIAGNOSIS — Z85828 Personal history of other malignant neoplasm of skin: Secondary | ICD-10-CM | POA: Diagnosis not present

## 2024-09-09 DIAGNOSIS — C4492 Squamous cell carcinoma of skin, unspecified: Secondary | ICD-10-CM

## 2024-09-09 NOTE — Progress Notes (Signed)
   Follow Up Visit   Subjective  Albert Wade is a 78 y.o. male who presents for the following: follow up from Mohs surgery   The patient presents for follow up from Mohs surgery for a SCC on the central lower lip, treated on 07/30/24, repaired with 2nd intention. The patient has been bandaging the wound as directed. The endorse the following concerns: none. Wife accompanying patient.   The following portions of the chart were reviewed this encounter and updated as appropriate: medications, allergies, medical history  Review of Systems:  No other skin or systemic complaints except as noted in HPI or Assessment and Plan.  Objective  Well appearing patient in no apparent distress; mood and affect are within normal limits.  A focal examination was performed including scalp, head, face. All findings within normal limits unless otherwise noted below.  Healing wound with mild erythema  Relevant physical exam findings are noted in the Assessment and Plan.    Assessment & Plan   Scar s/p Mohs for SCC on the lip, treated on 07/30/24, repaired with 2nd intention - Reassured that wound is healing well - No evidence of infection - No swelling, induration, purulence, dehiscence, or tenderness out of proportion to the clinical exam, see photo above - Discussed that scars take up to 12 months to mature from the date of surgery - Recommend SPF 30+ to scar daily to prevent purple color from UV exposure during scar maturation process - Discussed that erythema and raised appearance of scar will fade over the next 4-6 months - OK to start scar massage at 4-6 weeks post-op - Can consider silicone based products for scar healing starting at 6 weeks post-op - Can consider ILK in a few months if still hypertrophic  HISTORY OF SQUAMOUS CELL CARCINOMA OF THE SKIN - No evidence of recurrence today - Recommend regular full body skin exams - Recommend daily broad spectrum sunscreen SPF 30+ to sun-exposed  areas, reapply every 2 hours as needed.  - Call if any new or changing lesions are noted between office visits  Return in about 2 months (around 11/09/2024) for wound check.  I, Darice Smock, CMA, am acting as scribe for RUFUS CHRISTELLA HOLY, MD.   Documentation: I have reviewed the above documentation for accuracy and completeness, and I agree with the above.  RUFUS CHRISTELLA HOLY, MD

## 2024-09-09 NOTE — Patient Instructions (Addendum)

## 2024-09-10 DIAGNOSIS — G894 Chronic pain syndrome: Secondary | ICD-10-CM | POA: Diagnosis not present

## 2024-09-10 DIAGNOSIS — M47812 Spondylosis without myelopathy or radiculopathy, cervical region: Secondary | ICD-10-CM | POA: Diagnosis not present

## 2024-09-10 DIAGNOSIS — M48061 Spinal stenosis, lumbar region without neurogenic claudication: Secondary | ICD-10-CM | POA: Diagnosis not present

## 2024-09-10 DIAGNOSIS — M5416 Radiculopathy, lumbar region: Secondary | ICD-10-CM | POA: Diagnosis not present

## 2024-09-10 DIAGNOSIS — G4709 Other insomnia: Secondary | ICD-10-CM | POA: Diagnosis not present

## 2024-09-10 DIAGNOSIS — K5909 Other constipation: Secondary | ICD-10-CM | POA: Diagnosis not present

## 2024-09-10 DIAGNOSIS — Z79899 Other long term (current) drug therapy: Secondary | ICD-10-CM | POA: Diagnosis not present

## 2024-09-12 ENCOUNTER — Other Ambulatory Visit: Payer: Self-pay | Admitting: Internal Medicine

## 2024-11-04 ENCOUNTER — Ambulatory Visit: Admitting: Dermatology

## 2024-11-04 ENCOUNTER — Other Ambulatory Visit: Payer: Self-pay

## 2024-11-04 ENCOUNTER — Other Ambulatory Visit: Payer: Self-pay | Admitting: Internal Medicine

## 2024-11-11 ENCOUNTER — Ambulatory Visit: Admitting: Internal Medicine

## 2024-11-11 ENCOUNTER — Encounter: Payer: Self-pay | Admitting: Internal Medicine

## 2024-11-11 VITALS — BP 144/68 | HR 59 | Temp 97.8°F | Ht 70.0 in | Wt 176.4 lb

## 2024-11-11 DIAGNOSIS — I10 Essential (primary) hypertension: Secondary | ICD-10-CM

## 2024-11-11 DIAGNOSIS — E7800 Pure hypercholesterolemia, unspecified: Secondary | ICD-10-CM | POA: Diagnosis not present

## 2024-11-11 DIAGNOSIS — E559 Vitamin D deficiency, unspecified: Secondary | ICD-10-CM

## 2024-11-11 DIAGNOSIS — I83893 Varicose veins of bilateral lower extremities with other complications: Secondary | ICD-10-CM | POA: Diagnosis not present

## 2024-11-11 DIAGNOSIS — J449 Chronic obstructive pulmonary disease, unspecified: Secondary | ICD-10-CM

## 2024-11-11 DIAGNOSIS — R739 Hyperglycemia, unspecified: Secondary | ICD-10-CM

## 2024-11-11 DIAGNOSIS — Z0000 Encounter for general adult medical examination without abnormal findings: Secondary | ICD-10-CM | POA: Diagnosis not present

## 2024-11-11 DIAGNOSIS — E538 Deficiency of other specified B group vitamins: Secondary | ICD-10-CM

## 2024-11-11 DIAGNOSIS — C61 Malignant neoplasm of prostate: Secondary | ICD-10-CM

## 2024-11-11 DIAGNOSIS — Z0001 Encounter for general adult medical examination with abnormal findings: Secondary | ICD-10-CM

## 2024-11-11 MED ORDER — GLYCERIN (ADULT) 2 G RE SUPP: 1.0000 | RECTAL | 2 refills | Status: AC | PRN

## 2024-11-11 NOTE — Assessment & Plan Note (Signed)
Stable, cont inhaler asd

## 2024-11-11 NOTE — Assessment & Plan Note (Signed)
 Lab Results  Component Value Date   VITAMINB12 203 (L) 07/05/2023   Low, to start oral replacement - b12 1000 mcg qd

## 2024-11-11 NOTE — Assessment & Plan Note (Signed)
 BP Readings from Last 3 Encounters:  11/11/24 (!) 144/68  08/13/24 (!) 153/67  02/29/24 (!) 140/84   Mild uncontrolled but pt states controlled at home, pt to continue medical treatment norvasc  10 every day, toprol  xl 25 every day, declines other change

## 2024-11-11 NOTE — Assessment & Plan Note (Signed)
 Also for psa with labs per pt reqeust  Lab Results  Component Value Date   PSA1 <0.1 01/16/2023   PSA1 <0.1 04/28/2020   PSA1 <0.1 11/04/2019   PSA 0.03 (L) 07/05/2023   PSA 0.01 (L) 07/21/2021   PSA 0.02 (L) 11/09/2020   For f/u lab today to confirm consistent low psa per pt request, and defer urology for now

## 2024-11-11 NOTE — Assessment & Plan Note (Signed)
 With venous insufficiency - pt asked to restart compression stockings daytime only

## 2024-11-11 NOTE — Progress Notes (Signed)
 Patient ID: Albert Wade, male   DOB: Sep 14, 1946, 78 y.o.   [name]: [id number]         Chief Complaint:: wellness exam and htn, venous insufficiency, chronic constipation, hx of prostate ca, hyperglycemia, hld       HPI:  Albert Wade is a 78 y.o. male here for wellness exam; declines all vaccinations for now, o/w up to date                        Also Pt denies chest pain, increased sob or doe, wheezing, orthopnea, PND, palpitations, dizziness or syncope, except has worsening leg swelling with standing most of the day, better in the am after overnight  .   Pt denies polydipsia, polyuria, or new focal neuro s/s.    Pt denies fever, wt loss, night sweats, loss of appetite, or other constitutional symptoms  Denies worsening reflux, abd pain, dysphagia, n/v,or blood but has ongoing chronic constipation and movantik not always working now.  Last BM x 2 wks   now all narcotic has been discontinued.  Denies urinary symptoms such as dysuria, frequency, urgency, flank pain, hematuria or n/v, fever, chills.  Pt has hx of prostate ca several years ago stable, and he asks for psa today   Wt Readings from Last 3 Encounters:  11/11/24 176 lb 6 oz (80 kg)  08/13/24 172 lb 4 oz (78.1 kg)  02/29/24 164 lb (74.4 kg)   BP Readings from Last 3 Encounters:  11/11/24 (!) 144/68  08/13/24 (!) 153/67  02/29/24 (!) 140/84   Immunization History  Administered Date(s) Administered   INFLUENZA, HIGH DOSE SEASONAL PF 09/18/2018, 09/05/2019   Influenza Split 09/20/2021   Influenza, Seasonal, Injecte, Preservative Fre 08/26/2013   Influenza,inj,Quad PF,6+ Mos 09/21/2016   Influenza,inj,quad, With Preservative 10/23/2017   Influenza-Unspecified 10/12/2014, 09/18/2018, 09/17/2020, 09/17/2020   PFIZER(Purple Top)SARS-COV-2 Vaccination 02/16/2020, 03/10/2020   Pneumococcal Conjugate-13 12/20/2013   Pneumococcal Polysaccharide-23 05/12/2015   Td 12/26/2008   Health Maintenance Due  Topic Date Due   Zoster  Vaccines- Shingrix (1 of 2) Never done   DTaP/Tdap/Td (2 - Tdap) 12/26/2018   COVID-19 Vaccine (3 - Pfizer risk series) 04/07/2020   Medicare Annual Wellness (AWV)  05/20/2021   Influenza Vaccine  07/26/2024      Past Medical History:  Diagnosis Date   Acute bronchitis 01/01/2015   Acute upper respiratory infection 05/19/2016   Anginal pain    Anxiety    Arthritis    back,wrists,hx. previous fractures   BPH (benign prostatic hypertrophy)    BRONCHITIS, CHRONIC 04/29/2008   Qualifier: Diagnosis of  By: Shellia MD, Vineet     CAD 10/16/2009   Qualifier: Diagnosis of  By: Gardenia Pao     CAD (coronary artery disease)    angioplasty    Chronic back pain    Chronic bronchitis    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/2016   Confusion 04/27/2017   COPD (chronic obstructive pulmonary disease) (HCC)    Depression    Encounter for preventative adult health care exam with abnormal findings 12/20/2013   Fever 04/27/2017   GERD (gastroesophageal reflux disease)    Gynecomastia 05/12/2015   H/O hiatal hernia    pts wife states pt does not have    Heart murmur    History of kidney stones    HTN (hypertension)    Hypercholesteremia    Hyperglycemia 05/19/2016   HYPERLIPIDEMIA 04/29/2008   Qualifier: Diagnosis of  By: Clifford Norris     Impaired glucose tolerance 12/20/2013   Insomnia 04/27/2017   Lumbosacral radiculopathy 10/14/2016   MI (myocardial infarction) [facility])    mid-'90's   MYOCARDIAL INFARCTION 04/29/2008   Qualifier: History of  By: Clifford Norris     OSA (obstructive sleep apnea) 05/13/2011   Split night study 2011:  AHI 21/hr with obstructive and central events.  Placed on cpap and titrated to 13cm with reasonable control, but attempts to increase pressure increased the number of central events.  Medium resmed quattro full face mask used.     Peripheral neuropathy    peripheral neuropathy   Phimosis/adherent prepuce 10/21/2013   Pneumonia     Prostate cancer (HCC)    Shortness of breath 11/05/2010   Qualifier: Diagnosis of  By: Pixie, RN, BSN, Jacquelyn     Sleep apnea    mild-no cpap, unable to tolerate   Squamous cell carcinoma of skin 10/29/2020   right mid volar forearm   Squamous cell carcinoma of skin 01/09/2023   Left forearm - [facility]   Squamous cell carcinoma of skin 03/21/2024   left lateral pretibial, [facility]   Squamous cell carcinoma of skin 03/21/2024   central lower lip, Referral to [facility] with Dr. Corey   Umbilical hernia    Umbilical hernia without obstruction and without gangrene 05/19/2016   URI (upper respiratory infection) 04/27/2017   UTI (urinary tract infection) 12/01/2016   Varicose veins    Varicose veins of bilateral lower extremities with other complications 10/14/2016   Wheezing 01/01/2015   Past Surgical History:  Procedure Laterality Date   AMPUTATION TOE Right 04/01/2022   Procedure: AMPUTATION TOE-Right Great Toe Amputation;  Surgeon: Silva Juliene SAUNDERS, DPM;  Location: [facility] ORS;  Service: Podiatry;  Laterality: Right;   ANGIOPLASTY     APPENDECTOMY     ARTERY BIOPSY Right 10/15/2021   Procedure: BIOPSY TEMPORAL ARTERY;  Surgeon: Tye Millet, DO;  Location: [facility];  Service: General;  Laterality: Right;   BACK SURGERY     x3   CARDIAC CATHETERIZATION     11'11   CHOLECYSTECTOMY N/A 08/13/2023   Procedure: LAPAROSCOPIC CHOLECYSTECTOMY;  Surgeon: Lyndel Deward PARAS, MD;  Location: [facility];  Service: General;  Laterality: N/A;   CIRCUMCISION N/A 10/21/2013   Procedure: CIRCUMCISION ADULT;  Surgeon: Alm GORMAN Fragmin, MD;  Location: [facility];  Service: Urology;  Laterality: N/A;   COLONOSCOPY WITH PROPOFOL  N/A 06/09/2021   Procedure: COLONOSCOPY WITH PROPOFOL ;  Surgeon: Therisa Bi, MD;  Location: [facility] ENDOSCOPY;  Service: Gastroenterology;  Laterality: N/A;   CYSTOSCOPY N/A 10/21/2013   Procedure: CYSTOSCOPY FLEXIBLE;  Surgeon: Alm GORMAN Fragmin, MD;  Location: [facility];  Service: Urology;  Laterality: N/A;    CYSTOSCOPY W/ URETERAL STENT PLACEMENT Right 06/01/2020   Procedure: CYSTOSCOPY WITH RETROGRADE PYELOGRAM/URETERAL STENT Exchange;  Surgeon: Penne Knee, MD;  Location: [facility];  Service: Urology;  Laterality: Right;   CYSTOSCOPY WITH BIOPSY Right 01/06/2020   Procedure: CYSTOSCOPY WITH BIOPSY;  Surgeon: Penne Knee, MD;  Location: [facility];  Service: Urology;  Laterality: Right;   CYSTOSCOPY/URETEROSCOPY/HOLMIUM LASER/STENT PLACEMENT Right 01/06/2020   Procedure: CYSTOSCOPY/URETEROSCOPY/HOLMIUM LASER/STENT PLACEMENT;  Surgeon: Penne Knee, MD;  Location: [facility];  Service: Urology;  Laterality: Right;   ERCP N/A 08/12/2023   Procedure: ENDOSCOPIC RETROGRADE CHOLANGIOPANCREATOGRAPHY (ERCP);  Surgeon: Charlanne Groom, MD;  Location: [facility] ENDOSCOPY;  Service: Gastroenterology;  Laterality: N/A;   ESOPHAGOGASTRODUODENOSCOPY N/A 02/03/2021   Procedure: ESOPHAGOGASTRODUODENOSCOPY (EGD);  Surgeon: Therisa Bi, MD;  Location:  [facility] ENDOSCOPY;  Service: Gastroenterology;  Laterality: N/A;   ESOPHAGOGASTRODUODENOSCOPY (EGD) WITH PROPOFOL  N/A 03/09/2021   Procedure: ESOPHAGOGASTRODUODENOSCOPY (EGD) WITH PROPOFOL ;  Surgeon: Therisa Bi, MD;  Location: [facility] ENDOSCOPY;  Service: Gastroenterology;  Laterality: N/A;   HERNIA REPAIR     INSERTION OF MESH N/A 06/17/2016   Procedure: INSERTION OF MESH;  Surgeon: Morene Olives, MD;  Location: [facility];  Service: General;  Laterality: N/A;   LEFT HEART CATH AND CORONARY ANGIOGRAPHY N/A 01/30/2024   Procedure: LEFT HEART CATH AND CORONARY ANGIOGRAPHY;  Surgeon: Rolan Ezra RAMAN, MD;  Location: [facility] INVASIVE CV LAB;  Service: Cardiovascular;  Laterality: N/A;   REMOVAL OF STONES  08/12/2023   Procedure: REMOVAL OF STONES;  Surgeon: Charlanne Groom, MD;  Location: [facility] ENDOSCOPY;  Service: Gastroenterology;;   ANNETT  08/12/2023   Procedure: ANNETT;  Surgeon: Charlanne Groom, MD;  Location: [facility] ENDOSCOPY;  Service: Gastroenterology;;   UMBILICAL HERNIA REPAIR N/A  06/17/2016   Procedure: REPAIR UMBILICAL HERNIA WITH MESH;  Surgeon: Morene Olives, MD;  Location: [facility];  Service: General;  Laterality: N/A;   WRIST SURGERY Right     reports that he quit smoking about 28 years ago. His smoking use included cigarettes. He started smoking about 68 years ago. He has a 20 pack-year smoking history. He has never used smokeless tobacco. He reports that he does not drink alcohol and does not use drugs. family history includes Emphysema in his father; Hyperlipidemia in his father and mother; Prostate cancer in his father. Allergies  Allergen Reactions   Cyclobenzaprine     agitation    Seroquel [Quetiapine] Hives   Current Outpatient Medications on File Prior to Visit  Medication Sig Dispense Refill   albuterol  (VENTOLIN  HFA) 108 (90 Base) MCG/ACT inhaler INHALE 2 PUFFS INTO THE LUNGS 4 TIMES DAILY AS NEEDED 8.5 g 11   amLODipine  (NORVASC ) 10 MG tablet Take 1 tablet (10 mg total) by mouth daily. 90 tablet 3   aspirin  81 MG tablet Take 81 mg by mouth daily.     furosemide  (LASIX ) 40 MG tablet TAKE 1 TABLET BY MOUTH ONCE DAILY AS NEEDED. 90 tablet 3   metoprolol  succinate (TOPROL -XL) 50 MG 24 hr tablet Take 0.5 tablets (25 mg total) by mouth daily. Take with or immediately following a meal. 45 tablet 3   MOVANTIK 25 MG TABS tablet Take 25 mg by mouth daily.     mupirocin  ointment (BACTROBAN ) 2 % Apply 1 Application topically 2 (two) times daily. 22 g 2   omeprazole  (PRILOSEC) 20 MG capsule TAKE 1 CAPSULE BY MOUTH TWICE DAILY 180 capsule 3   potassium chloride  SA (KLOR-CON  M) 20 MEQ tablet Take 1 tablet (20 mEq total) by mouth daily. 90 tablet 3   rosuvastatin  (CRESTOR ) 20 MG tablet Take 1 tablet (20 mg total) by mouth daily. 90 tablet 3   traZODone  (DESYREL ) 50 MG tablet Take 1 tablet (50 mg total) by mouth at bedtime as needed for sleep. (Patient taking differently: Take 100 mg by mouth at bedtime.) 30 tablet 0   No current facility-administered medications  on file prior to visit.        ROS:  All others reviewed and negative.  Objective        PE:  BP (!) 144/68   Pulse (!) 59   Temp 97.8 F (36.6 C) (Temporal)   Ht 5' 10 (1.778 m)   Wt 176 lb 6 oz (80 kg)   BMI 25.31 kg/m  Constitutional: Pt appears in NAD               HENT: Head: NCAT.                Right Ear: External ear normal.                 Left Ear: External ear normal.                Eyes: . Pupils are equal, round, and reactive to light. Conjunctivae and EOM are normal               Nose: without d/c or deformity               Neck: Neck supple. Gross normal ROM               Cardiovascular: Normal rate and regular rhythm.                 Pulmonary/Chest: Effort normal and breath sounds without rales or wheezing.                Abd:  Soft, NT, ND, + BS, no organomegaly               Neurological: Pt is alert. At baseline orientation, motor grossly intact               Skin: Skin is warm. No rashes, no other new lesions, LE edema - trace to 1+ bilateral               Psychiatric: Pt behavior is normal without agitation   Micro: none  Cardiac tracings I have personally interpreted today:  none  Pertinent Radiological findings (summarize): none   Lab Results  Component Value Date   WBC 6.3 01/24/2024   HGB 12.9 (L) 01/24/2024   HCT 39.6 01/24/2024   PLT 244 01/24/2024   GLUCOSE 104 (H) 08/13/2024   CHOL 104 02/29/2024   TRIG 62 02/29/2024   HDL 59 02/29/2024   LDLDIRECT 78.0 05/19/2016   LDLCALC 33 02/29/2024   ALT 14 02/29/2024   AST 24 02/29/2024   NA 143 08/13/2024   K 4.1 08/13/2024   CL 107 08/13/2024   CREATININE 0.82 08/13/2024   BUN 15 08/13/2024   CO2 29 08/13/2024   TSH 1.184 08/13/2023   PSA 0.03 (L) 07/05/2023   INR 1.2 08/11/2023   HGBA1C 6.4 07/05/2023   Assessment/Plan:  Albert Wade is a 78 y.o. White or Caucasian [1] male with  has a past medical history of Acute bronchitis (01/01/2015), Acute upper respiratory  infection (05/19/2016), Anginal pain, Anxiety, Arthritis, BPH (benign prostatic hypertrophy), BRONCHITIS, CHRONIC (04/29/2008), CAD (10/16/2009), CAD (coronary artery disease), Chronic back pain, Chronic bronchitis, Chronic low back pain with sciatica (10/14/2016), Confusion (04/27/2017), COPD (chronic obstructive pulmonary disease) (HCC), Depression, Encounter for preventative adult health care exam with abnormal findings (12/20/2013), Fever (04/27/2017), GERD (gastroesophageal reflux disease), Gynecomastia (05/12/2015), H/O hiatal hernia, Heart murmur, History of kidney stones, HTN (hypertension), Hypercholesteremia, Hyperglycemia (05/19/2016), HYPERLIPIDEMIA (04/29/2008), Impaired glucose tolerance (12/20/2013), Insomnia (04/27/2017), Lumbosacral radiculopathy (10/14/2016), MI (myocardial infarction) (HCC), MYOCARDIAL INFARCTION (04/29/2008), OSA (obstructive sleep apnea) (05/13/2011), Peripheral neuropathy, Phimosis/adherent prepuce (10/21/2013), Pneumonia, Prostate cancer (HCC), Shortness of breath (11/05/2010), Sleep apnea, Squamous cell carcinoma of skin (10/29/2020), Squamous cell carcinoma of skin (01/09/2023), Squamous cell carcinoma of skin (03/21/2024), Squamous cell carcinoma of skin (03/21/2024), Umbilical hernia, Umbilical hernia without obstruction and without gangrene (05/19/2016), URI (upper respiratory infection) (04/27/2017),  UTI (urinary tract infection) (12/01/2016), Varicose veins, Varicose veins of bilateral lower extremities with other complications (10/14/2016), and Wheezing (01/01/2015).  Prostate cancer [facility]) Also for psa with labs per pt reqeust  Lab Results  Component Value Date   PSA1 <0.1 01/16/2023   PSA1 <0.1 04/28/2020   PSA1 <0.1 11/04/2019   PSA 0.03 (L) 07/05/2023   PSA 0.01 (L) 07/21/2021   PSA 0.02 (L) 11/09/2020   For f/u lab today to confirm consistent low psa per pt request, and defer urology for now    Encounter for preventative adult health care exam  with abnormal findings Age and sex appropriate education and counseling updated with regular exercise and diet Referrals for preventative services - none needed Immunizations addressed - declines all today Smoking counseling  - none needed Evidence for depression or other mood disorder - none significant Most recent labs reviewed. I have personally reviewed and have noted: 1) the patient's medical and social history 2) The patient's current medications and supplements 3) The patient's height, weight, and BMI have been recorded in the chart   COPD (chronic obstructive pulmonary disease) (HCC) Stable, cont inhaler asd  Hyperglycemia Lab Results  Component Value Date   HGBA1C 6.4 07/05/2023   Stable, pt to continue current medical treatment  - diet, wt control   HTN (hypertension) BP Readings from Last 3 Encounters:  11/11/24 (!) 144/68  08/13/24 (!) 153/67  02/29/24 (!) 140/84   Mild uncontrolled but pt states controlled at home, pt to continue medical treatment norvasc  10 every day, toprol  xl 25 every day, declines other change   HLD (hyperlipidemia) Lab Results  Component Value Date   LDLCALC 33 02/29/2024   Stable, pt to continue current statin crestor  20 mg qd   B12 deficiency Lab Results  Component Value Date   VITAMINB12 203 (L) 07/05/2023   Low, to start oral replacement - b12 1000 mcg qd   Varicose veins of bilateral lower extremities with other complications With venous insufficiency - pt asked to restart compression stockings daytime only  Followup: Return in about 6 months (around 05/11/2025).  Lynwood Rush, MD 11/12/2024 1:06 PM [facility]

## 2024-11-11 NOTE — Assessment & Plan Note (Signed)
 Lab Results  Component Value Date   LDLCALC 33 02/29/2024   Stable, pt to continue current statin crestor  20 mg qd

## 2024-11-11 NOTE — Assessment & Plan Note (Signed)
 Age and sex appropriate education and counseling updated with regular exercise and diet Referrals for preventative services - none needed Immunizations addressed - declines all today Smoking counseling  - none needed Evidence for depression or other mood disorder - none significant Most recent labs reviewed. I have personally reviewed and have noted: 1) the patient's medical and social history 2) The patient's current medications and supplements 3) The patient's height, weight, and BMI have been recorded in the chart

## 2024-11-11 NOTE — Patient Instructions (Signed)
 Please continue all other medications as before, and ok to try the glycerin suppositories  Please have the pharmacy call with any other refills you may need.  Please continue your efforts at being more active, low cholesterol diet, and weight control.  You are otherwise up to date with prevention measures today.  Please keep your appointments with your specialists as you may have planned  Please go to the LAB at the blood drawing area for the tests to be done  You will be contacted by phone if any changes need to be made immediately.  Otherwise, you will receive a letter about your results with an explanation, but please check with MyChart first.  Please make an Appointment to return in 6 months, or sooner if needed

## 2024-11-11 NOTE — Assessment & Plan Note (Signed)
Lab Results  Component Value Date   HGBA1C 6.4 07/05/2023   Stable, pt to continue current medical treatment  - diet, wt control

## 2025-01-08 ENCOUNTER — Ambulatory Visit: Admitting: Podiatry

## 2025-01-08 ENCOUNTER — Encounter: Payer: Self-pay | Admitting: Podiatry

## 2025-01-08 DIAGNOSIS — L600 Ingrowing nail: Secondary | ICD-10-CM | POA: Diagnosis not present

## 2025-01-08 MED ORDER — NEOMYCIN-POLYMYXIN-HC 3.5-10000-1 OT SOLN: OTIC | 0 refills | Status: AC

## 2025-01-08 NOTE — Progress Notes (Signed)
 "  Subjective:  Patient ID: Albert Wade, male    DOB: 1946/03/28,  [name]: [id number] HPI Chief Complaint  Patient presents with   Nail Problem     Left foot nail coming off  had surgery requesting Dr Verta     79 y.o. male presents with the above complaint.   ROS: Denies fever chills nausea mobic muscle aches pains calf pain back pain chest pain shortness of breath.  Past Medical History:  Diagnosis Date   Acute bronchitis 01/01/2015   Acute upper respiratory infection 05/19/2016   Anginal pain    Anxiety    Arthritis    back,wrists,hx. previous fractures   BPH (benign prostatic hypertrophy)    BRONCHITIS, CHRONIC 04/29/2008   Qualifier: Diagnosis of  By: Shellia MD, Vineet     CAD 10/16/2009   Qualifier: Diagnosis of  By: Gardenia Pao     CAD (coronary artery disease)    angioplasty    Chronic back pain    Chronic bronchitis    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/2016   Confusion 04/27/2017   COPD (chronic obstructive pulmonary disease) (HCC)    Depression    Encounter for preventative adult health care exam with abnormal findings 12/20/2013   Fever 04/27/2017   GERD (gastroesophageal reflux disease)    Gynecomastia 05/12/2015   H/O hiatal hernia    pts wife states pt does not have    Heart murmur    History of kidney stones    HTN (hypertension)    Hypercholesteremia    Hyperglycemia 05/19/2016   HYPERLIPIDEMIA 04/29/2008   Qualifier: Diagnosis of  By: Clifford Norris     Impaired glucose tolerance 12/20/2013   Insomnia 04/27/2017   Lumbosacral radiculopathy 10/14/2016   MI (myocardial infarction) [facility])    mid-'90's   MYOCARDIAL INFARCTION 04/29/2008   Qualifier: History of  By: Clifford Norris     OSA (obstructive sleep apnea) 05/13/2011   Split night study 2011:  AHI 21/hr with obstructive and central events.  Placed on cpap and titrated to 13cm with reasonable control, but attempts to increase pressure increased the number of central  events.  Medium resmed quattro full face mask used.     Peripheral neuropathy    peripheral neuropathy   Phimosis/adherent prepuce 10/21/2013   Pneumonia    Prostate cancer (HCC)    Shortness of breath 11/05/2010   Qualifier: Diagnosis of  By: Pixie, RN, BSN, Jacquelyn     Sleep apnea    mild-no cpap, unable to tolerate   Squamous cell carcinoma of skin 10/29/2020   right mid volar forearm   Squamous cell carcinoma of skin 01/09/2023   Left forearm - [facility]   Squamous cell carcinoma of skin 03/21/2024   left lateral pretibial, [facility]   Squamous cell carcinoma of skin 03/21/2024   central lower lip, Referral to [facility] with Dr. Corey   Umbilical hernia    Umbilical hernia without obstruction and without gangrene 05/19/2016   URI (upper respiratory infection) 04/27/2017   UTI (urinary tract infection) 12/01/2016   Varicose veins    Varicose veins of bilateral lower extremities with other complications 10/14/2016   Wheezing 01/01/2015   Past Surgical History:  Procedure Laterality Date   AMPUTATION TOE Right 04/01/2022   Procedure: AMPUTATION TOE-Right Great Toe Amputation;  Surgeon: Silva Juliene SAUNDERS, DPM;  Location: [facility];  Service: Podiatry;  Laterality: Right;   ANGIOPLASTY     APPENDECTOMY     ARTERY BIOPSY Right 10/15/2021  Procedure: BIOPSY TEMPORAL ARTERY;  Surgeon: Tye Millet, DO;  Location: [facility];  Service: General;  Laterality: Right;   BACK SURGERY     x3   CARDIAC CATHETERIZATION     11'11   CHOLECYSTECTOMY N/A 08/13/2023   Procedure: LAPAROSCOPIC CHOLECYSTECTOMY;  Surgeon: Lyndel Deward PARAS, MD;  Location: [facility];  Service: General;  Laterality: N/A;   CIRCUMCISION N/A 10/21/2013   Procedure: CIRCUMCISION ADULT;  Surgeon: Alm GORMAN Fragmin, MD;  Location: [facility];  Service: Urology;  Laterality: N/A;   COLONOSCOPY WITH PROPOFOL  N/A 06/09/2021   Procedure: COLONOSCOPY WITH PROPOFOL ;  Surgeon: Therisa Bi, MD;  Location: [facility] ENDOSCOPY;  Service: Gastroenterology;   Laterality: N/A;   CYSTOSCOPY N/A 10/21/2013   Procedure: CYSTOSCOPY FLEXIBLE;  Surgeon: Alm GORMAN Fragmin, MD;  Location: [facility];  Service: Urology;  Laterality: N/A;   CYSTOSCOPY W/ URETERAL STENT PLACEMENT Right 06/01/2020   Procedure: CYSTOSCOPY WITH RETROGRADE PYELOGRAM/URETERAL STENT Exchange;  Surgeon: Penne Knee, MD;  Location: [facility];  Service: Urology;  Laterality: Right;   CYSTOSCOPY WITH BIOPSY Right 01/06/2020   Procedure: CYSTOSCOPY WITH BIOPSY;  Surgeon: Penne Knee, MD;  Location: [facility];  Service: Urology;  Laterality: Right;   CYSTOSCOPY/URETEROSCOPY/HOLMIUM LASER/STENT PLACEMENT Right 01/06/2020   Procedure: CYSTOSCOPY/URETEROSCOPY/HOLMIUM LASER/STENT PLACEMENT;  Surgeon: Penne Knee, MD;  Location: [facility];  Service: Urology;  Laterality: Right;   ERCP N/A 08/12/2023   Procedure: ENDOSCOPIC RETROGRADE CHOLANGIOPANCREATOGRAPHY (ERCP);  Surgeon: Charlanne Groom, MD;  Location: [facility] ENDOSCOPY;  Service: Gastroenterology;  Laterality: N/A;   ESOPHAGOGASTRODUODENOSCOPY N/A 02/03/2021   Procedure: ESOPHAGOGASTRODUODENOSCOPY (EGD);  Surgeon: Therisa Bi, MD;  Location: [facility] ENDOSCOPY;  Service: Gastroenterology;  Laterality: N/A;   ESOPHAGOGASTRODUODENOSCOPY (EGD) WITH PROPOFOL  N/A 03/09/2021   Procedure: ESOPHAGOGASTRODUODENOSCOPY (EGD) WITH PROPOFOL ;  Surgeon: Therisa Bi, MD;  Location: [facility] ENDOSCOPY;  Service: Gastroenterology;  Laterality: N/A;   HERNIA REPAIR     INSERTION OF MESH N/A 06/17/2016   Procedure: INSERTION OF MESH;  Surgeon: Morene Olives, MD;  Location: [facility];  Service: General;  Laterality: N/A;   LEFT HEART CATH AND CORONARY ANGIOGRAPHY N/A 01/30/2024   Procedure: LEFT HEART CATH AND CORONARY ANGIOGRAPHY;  Surgeon: Rolan Ezra GORMAN, MD;  Location: [facility] INVASIVE CV LAB;  Service: Cardiovascular;  Laterality: N/A;   REMOVAL OF STONES  08/12/2023   Procedure: REMOVAL OF STONES;  Surgeon: Charlanne Groom, MD;  Location: [facility] ENDOSCOPY;  Service: Gastroenterology;;    ANNETT  08/12/2023   Procedure: ANNETT;  Surgeon: Charlanne Groom, MD;  Location: [facility] ENDOSCOPY;  Service: Gastroenterology;;   UMBILICAL HERNIA REPAIR N/A 06/17/2016   Procedure: REPAIR UMBILICAL HERNIA WITH MESH;  Surgeon: Morene Olives, MD;  Location: [facility];  Service: General;  Laterality: N/A;   WRIST SURGERY Right    Current Medications[1]  Allergies[2] Review of Systems Objective:  There were no vitals filed for this visit.  General: Well developed, nourished, in no acute distress, alert and oriented x3   Dermatological: Skin is warm, dry and supple bilateral. Nails x 10 are well maintained; a hallux nail plate left is thick with distal onycholysis and painful on palpation.  There is no purulence no malodor though there is mild erythema proximally and tenderness in the proximal nail fold.  Remaining integument appears unremarkable at this time. There are no open sores, no preulcerative lesions, no rash or signs of infection present.  Vascular: Dorsalis Pedis artery and Posterior Tibial artery pedal pulses are 2/4 bilateral with immedate capillary fill time. Pedal hair growth present. No varicosities and  no lower extremity edema present bilateral.   Neruologic: Grossly intact via light touch bilateral. Vibratory intact via tuning fork bilateral. Protective threshold with Semmes Wienstein monofilament intact to all pedal sites bilateral. Patellar and Achilles deep tendon reflexes 2+ bilateral. No Babinski or clonus noted bilateral.   Musculoskeletal: No gross boney pedal deformities bilateral. No pain, crepitus, or limitation noted with foot and ankle range of motion bilateral. Muscular strength 5/5 in all groups tested bilateral.  Gait: Unassisted, Nonantalgic.    Radiographs:  None taken  Assessment & Plan:   Assessment: Paronychia and ingrown nail hallux left  Plan: Total nail avulsion was performed after local anesthetic was administered all necrotic tissue  was sharply resected.  Betadine Telfa pad and a dry sterile compressive dressing was applied he was given both oral and home-going structures the care and soaking of the toe.  Follow-up with him in 2 weeks.  Corticosporin otic was dispensed.     Dvid Pendry T. Aubrey Blackard, DPM    [1]  Current Outpatient Medications:    DULoxetine  (CYMBALTA ) 30 MG capsule, Take 30 mg by mouth daily., Disp: , Rfl:    gabapentin  (NEURONTIN ) 300 MG capsule, Take 300 mg by mouth 3 (three) times daily., Disp: , Rfl:    neomycin -polymyxin-hydrocortisone (CORTISPORIN) OTIC solution, Apply one to two drops to toe after soaking twice daily., Disp: 10 mL, Rfl: 0   traZODone  (DESYREL ) 100 MG tablet, Take 100 mg by mouth at bedtime., Disp: , Rfl:    albuterol  (VENTOLIN  HFA) 108 (90 Base) MCG/ACT inhaler, INHALE 2 PUFFS INTO THE LUNGS 4 TIMES DAILY AS NEEDED, Disp: 8.5 g, Rfl: 11   amLODipine  (NORVASC ) 10 MG tablet, Take 1 tablet (10 mg total) by mouth daily., Disp: 90 tablet, Rfl: 3   aspirin  81 MG tablet, Take 81 mg by mouth daily., Disp: , Rfl:    furosemide  (LASIX ) 40 MG tablet, TAKE 1 TABLET BY MOUTH ONCE DAILY AS NEEDED., Disp: 90 tablet, Rfl: 3   glycerin  adult 2 g suppository, Place 1 suppository rectally as needed for constipation., Disp: 12 suppository, Rfl: 2   metoprolol  succinate (TOPROL -XL) 50 MG 24 hr tablet, Take 0.5 tablets (25 mg total) by mouth daily. Take with or immediately following a meal., Disp: 45 tablet, Rfl: 3   MOVANTIK 25 MG TABS tablet, Take 25 mg by mouth daily., Disp: , Rfl:    mupirocin  ointment (BACTROBAN ) 2 %, Apply 1 Application topically 2 (two) times daily., Disp: 22 g, Rfl: 2   omeprazole  (PRILOSEC) 20 MG capsule, TAKE 1 CAPSULE BY MOUTH TWICE DAILY, Disp: 180 capsule, Rfl: 3   potassium chloride  SA (KLOR-CON  M) 20 MEQ tablet, Take 1 tablet (20 mEq total) by mouth daily., Disp: 90 tablet, Rfl: 3   rosuvastatin  (CRESTOR ) 20 MG tablet, Take 1 tablet (20 mg total) by mouth daily., Disp: 90 tablet,  Rfl: 3   traZODone  (DESYREL ) 50 MG tablet, Take 1 tablet (50 mg total) by mouth at bedtime as needed for sleep. (Patient taking differently: Take 100 mg by mouth at bedtime.), Disp: 30 tablet, Rfl: 0 [2]  Allergies Allergen Reactions   Cyclobenzaprine     agitation    Seroquel [Quetiapine] Hives   "

## 2025-01-08 NOTE — Patient Instructions (Signed)
 Epsom Salt Soak Instructions    IF SOAKING IS STILL NECESSARY, START THIS 2 WEEKS AFTER INITIAL PROCEDURE   Place 1/4 cup of epsom salts in a quart of warm tap water.  Soak your foot or feet in the solution for 20 minutes twice a day until you notice the area has dried and a scab has formed. Continue to apply other medications to the area as directed by the doctor such as polysporin, neosporin or cortisporin drops.  IF YOUR SKIN BECOMES IRRITATED WHILE USING THESE INSTRUCTIONS, IT IS OKAY TO SWITCH TO  WHITE VINEGAR AND WATER. Or you may use antibacterial soap and water to keep the toe clean  Monitor for any signs/symptoms of infection. Call the office immediately if any occur or go directly to the emergency room. Call with any questions/concerns.

## 2025-01-09 ENCOUNTER — Telehealth: Payer: Self-pay

## 2025-01-09 DIAGNOSIS — I5032 Chronic diastolic (congestive) heart failure: Secondary | ICD-10-CM

## 2025-01-09 DIAGNOSIS — N1330 Unspecified hydronephrosis: Secondary | ICD-10-CM

## 2025-01-09 DIAGNOSIS — I2510 Atherosclerotic heart disease of native coronary artery without angina pectoris: Secondary | ICD-10-CM

## 2025-01-09 NOTE — Telephone Encounter (Signed)
 Reordered labs per dr rolan

## 2025-01-10 ENCOUNTER — Ambulatory Visit (HOSPITAL_COMMUNITY): Payer: Self-pay | Admitting: Cardiology

## 2025-01-10 LAB — LIPID PANEL
Chol/HDL Ratio: 2 ratio (ref 0.0–5.0)
Cholesterol, Total: 113 mg/dL (ref 100–199)
HDL: 56 mg/dL
LDL Chol Calc (NIH): 41 mg/dL (ref 0–99)
Triglycerides: 82 mg/dL (ref 0–149)
VLDL Cholesterol Cal: 16 mg/dL (ref 5–40)

## 2025-01-10 LAB — URINALYSIS, ROUTINE W REFLEX MICROSCOPIC
Bilirubin, UA: NEGATIVE
Glucose, UA: NEGATIVE
Ketones, UA: NEGATIVE
Leukocytes,UA: NEGATIVE
Nitrite, UA: NEGATIVE
Protein,UA: NEGATIVE
RBC, UA: NEGATIVE
Specific Gravity, UA: 1.008 (ref 1.005–1.030)
Urobilinogen, Ur: 0.2 mg/dL (ref 0.2–1.0)
pH, UA: 7 (ref 5.0–7.5)

## 2025-01-10 LAB — CBC WITH DIFFERENTIAL/PLATELET
Basophils Absolute: 0 x10E3/uL (ref 0.0–0.2)
Basos: 1 %
EOS (ABSOLUTE): 0.1 x10E3/uL (ref 0.0–0.4)
Eos: 2 %
Hematocrit: 36.4 % — ABNORMAL LOW (ref 37.5–51.0)
Hemoglobin: 12 g/dL — ABNORMAL LOW (ref 13.0–17.7)
Immature Grans (Abs): 0 x10E3/uL (ref 0.0–0.1)
Immature Granulocytes: 0 %
Lymphocytes Absolute: 1.3 x10E3/uL (ref 0.7–3.1)
Lymphs: 24 %
MCH: 30.6 pg (ref 26.6–33.0)
MCHC: 33 g/dL (ref 31.5–35.7)
MCV: 93 fL (ref 79–97)
Monocytes Absolute: 0.5 x10E3/uL (ref 0.1–0.9)
Monocytes: 9 %
Neutrophils Absolute: 3.4 x10E3/uL (ref 1.4–7.0)
Neutrophils: 64 %
Platelets: 170 x10E3/uL (ref 150–450)
RBC: 3.92 x10E6/uL — ABNORMAL LOW (ref 4.14–5.80)
RDW: 12.8 % (ref 11.6–15.4)
WBC: 5.3 x10E3/uL (ref 3.4–10.8)

## 2025-01-10 LAB — BASIC METABOLIC PANEL WITH GFR
BUN/Creatinine Ratio: 14 (ref 10–24)
BUN: 14 mg/dL (ref 8–27)
CO2: 27 mmol/L (ref 20–29)
Calcium: 9 mg/dL (ref 8.6–10.2)
Chloride: 102 mmol/L (ref 96–106)
Creatinine, Ser: 0.99 mg/dL (ref 0.76–1.27)
Glucose: 81 mg/dL (ref 70–99)
Potassium: 4.6 mmol/L (ref 3.5–5.2)
Sodium: 143 mmol/L (ref 134–144)
eGFR: 78 mL/min/1.73

## 2025-01-10 LAB — HEMOGLOBIN A1C
Est. average glucose Bld gHb Est-mCnc: 120 mg/dL
Hgb A1c MFr Bld: 5.8 % — ABNORMAL HIGH (ref 4.8–5.6)

## 2025-01-10 LAB — VITAMIN B12: Vitamin B-12: 453 pg/mL (ref 232–1245)

## 2025-01-10 LAB — HEPATIC FUNCTION PANEL
ALT: 14 IU/L (ref 0–44)
AST: 22 IU/L (ref 0–40)
Albumin: 4 g/dL (ref 3.8–4.8)
Alkaline Phosphatase: 85 IU/L (ref 47–123)
Bilirubin Total: 0.2 mg/dL (ref 0.0–1.2)
Bilirubin, Direct: 0.1 mg/dL (ref 0.00–0.40)
Total Protein: 6.1 g/dL (ref 6.0–8.5)

## 2025-01-10 LAB — PSA: Prostate Specific Ag, Serum: <0.1 ng/mL (ref 0.0–4.0)

## 2025-01-10 LAB — TSH: TSH: 2.23 u[IU]/mL (ref 0.450–4.500)

## 2025-01-10 LAB — VITAMIN D 25 HYDROXY (VIT D DEFICIENCY, FRACTURES): Vit D, 25-Hydroxy: 37.6 ng/mL (ref 30.0–100.0)

## 2025-01-16 ENCOUNTER — Other Ambulatory Visit: Payer: Self-pay | Admitting: Cardiology

## 2025-01-17 ENCOUNTER — Encounter

## 2025-01-29 ENCOUNTER — Ambulatory Visit: Admitting: Podiatry

## 2025-03-11 ENCOUNTER — Encounter
